# Patient Record
Sex: Female | Born: 1950
Health system: Southern US, Community
[De-identification: ages and names within clinical notes are randomized; demographics above are authoritative.]

## PROBLEM LIST (undated history)

## (undated) DIAGNOSIS — Z9981 Dependence on supplemental oxygen: Secondary | ICD-10-CM

## (undated) DIAGNOSIS — M533 Sacrococcygeal disorders, not elsewhere classified: Secondary | ICD-10-CM

## (undated) DIAGNOSIS — K219 Gastro-esophageal reflux disease without esophagitis: Secondary | ICD-10-CM

## (undated) DIAGNOSIS — C50919 Malignant neoplasm of unspecified site of unspecified female breast: Secondary | ICD-10-CM

## (undated) DIAGNOSIS — G8929 Other chronic pain: Secondary | ICD-10-CM

## (undated) DIAGNOSIS — M7061 Trochanteric bursitis, right hip: Secondary | ICD-10-CM

## (undated) DIAGNOSIS — M797 Fibromyalgia: Secondary | ICD-10-CM

## (undated) DIAGNOSIS — J449 Chronic obstructive pulmonary disease, unspecified: Secondary | ICD-10-CM

## (undated) DIAGNOSIS — D649 Anemia, unspecified: Secondary | ICD-10-CM

## (undated) DIAGNOSIS — E538 Deficiency of other specified B group vitamins: Secondary | ICD-10-CM

## (undated) DIAGNOSIS — I519 Heart disease, unspecified: Secondary | ICD-10-CM

## (undated) DIAGNOSIS — G43909 Migraine, unspecified, not intractable, without status migrainosus: Secondary | ICD-10-CM

## (undated) DIAGNOSIS — M7062 Trochanteric bursitis, left hip: Secondary | ICD-10-CM

## (undated) DIAGNOSIS — K589 Irritable bowel syndrome without diarrhea: Secondary | ICD-10-CM

## (undated) DIAGNOSIS — E785 Hyperlipidemia, unspecified: Secondary | ICD-10-CM

## (undated) DIAGNOSIS — M1990 Unspecified osteoarthritis, unspecified site: Secondary | ICD-10-CM

## (undated) HISTORY — PX: CARPAL TUNNEL RELEASE: SHX101

## (undated) HISTORY — PX: CERVICAL DISC SURGERY: SHX588

## (undated) HISTORY — DX: Deficiency of other specified B group vitamins: E53.8

## (undated) HISTORY — DX: Sacrococcygeal disorders, not elsewhere classified: M53.3

## (undated) HISTORY — DX: Fibromyalgia: M79.7

## (undated) HISTORY — DX: Heart disease, unspecified: I51.9

## (undated) HISTORY — DX: Gastro-esophageal reflux disease without esophagitis: K21.9

## (undated) HISTORY — DX: Trochanteric bursitis, left hip: M70.62

## (undated) HISTORY — DX: Irritable bowel syndrome, unspecified: K58.9

## (undated) HISTORY — DX: Migraine, unspecified, not intractable, without status migrainosus: G43.909

## (undated) HISTORY — DX: Hyperlipidemia, unspecified: E78.5

## (undated) HISTORY — PX: SEPTOPLASTY: SUR1290

## (undated) HISTORY — DX: Trochanteric bursitis, right hip: M70.61

## (undated) HISTORY — DX: Other chronic pain: G89.29

## (undated) HISTORY — DX: Anemia, unspecified: D64.9

## (undated) HISTORY — PX: ABDOMINAL HYSTERECTOMY: SHX81

## (undated) HISTORY — DX: Malignant neoplasm of unspecified site of unspecified female breast: C50.919

---

## 1997-08-31 HISTORY — PX: COLONOSCOPY W/ POLYPECTOMY: SHX1380

## 1997-12-17 ENCOUNTER — Ambulatory Visit (HOSPITAL_BASED_OUTPATIENT_CLINIC_OR_DEPARTMENT_OTHER): Admission: RE | Admit: 1997-12-17 | Discharge: 1997-12-17 | Payer: Self-pay | Admitting: *Deleted

## 1997-12-24 ENCOUNTER — Other Ambulatory Visit: Admission: RE | Admit: 1997-12-24 | Discharge: 1997-12-24 | Payer: Self-pay | Admitting: Gynecology

## 1999-05-08 ENCOUNTER — Ambulatory Visit (HOSPITAL_COMMUNITY): Admission: RE | Admit: 1999-05-08 | Discharge: 1999-05-08 | Payer: Self-pay | Admitting: *Deleted

## 1999-06-26 ENCOUNTER — Ambulatory Visit (HOSPITAL_COMMUNITY): Admission: RE | Admit: 1999-06-26 | Discharge: 1999-06-26 | Payer: Self-pay | Admitting: Internal Medicine

## 1999-06-26 ENCOUNTER — Encounter: Payer: Self-pay | Admitting: Internal Medicine

## 1999-08-29 ENCOUNTER — Ambulatory Visit (HOSPITAL_COMMUNITY): Admission: RE | Admit: 1999-08-29 | Discharge: 1999-08-29 | Payer: Self-pay | Admitting: Orthopedic Surgery

## 1999-08-29 ENCOUNTER — Encounter: Payer: Self-pay | Admitting: Orthopedic Surgery

## 1999-09-15 ENCOUNTER — Encounter: Payer: Self-pay | Admitting: Orthopedic Surgery

## 1999-09-15 ENCOUNTER — Ambulatory Visit (HOSPITAL_COMMUNITY): Admission: RE | Admit: 1999-09-15 | Discharge: 1999-09-15 | Payer: Self-pay | Admitting: Orthopedic Surgery

## 1999-10-13 ENCOUNTER — Ambulatory Visit (HOSPITAL_COMMUNITY): Admission: RE | Admit: 1999-10-13 | Discharge: 1999-10-13 | Payer: Self-pay | Admitting: Specialist

## 1999-10-13 ENCOUNTER — Encounter: Payer: Self-pay | Admitting: Specialist

## 2000-01-05 ENCOUNTER — Encounter: Payer: Self-pay | Admitting: Neurosurgery

## 2000-01-07 ENCOUNTER — Inpatient Hospital Stay (HOSPITAL_COMMUNITY): Admission: RE | Admit: 2000-01-07 | Discharge: 2000-01-08 | Payer: Self-pay | Admitting: Neurosurgery

## 2000-01-07 ENCOUNTER — Encounter: Payer: Self-pay | Admitting: Neurosurgery

## 2000-01-28 ENCOUNTER — Encounter: Payer: Self-pay | Admitting: Neurosurgery

## 2000-01-28 ENCOUNTER — Encounter: Admission: RE | Admit: 2000-01-28 | Discharge: 2000-01-28 | Payer: Self-pay | Admitting: Neurosurgery

## 2000-03-12 ENCOUNTER — Encounter: Payer: Self-pay | Admitting: Neurosurgery

## 2000-03-12 ENCOUNTER — Encounter: Admission: RE | Admit: 2000-03-12 | Discharge: 2000-03-12 | Payer: Self-pay | Admitting: Neurosurgery

## 2000-03-16 ENCOUNTER — Ambulatory Visit (HOSPITAL_COMMUNITY): Admission: RE | Admit: 2000-03-16 | Discharge: 2000-03-16 | Payer: Self-pay | Admitting: Gastroenterology

## 2000-04-27 ENCOUNTER — Encounter: Payer: Self-pay | Admitting: Neurosurgery

## 2000-04-27 ENCOUNTER — Encounter: Admission: RE | Admit: 2000-04-27 | Discharge: 2000-04-27 | Payer: Self-pay | Admitting: Neurosurgery

## 2000-05-14 ENCOUNTER — Encounter: Admission: RE | Admit: 2000-05-14 | Discharge: 2000-06-08 | Payer: Self-pay | Admitting: Neurosurgery

## 2000-06-01 ENCOUNTER — Encounter: Admission: RE | Admit: 2000-06-01 | Discharge: 2000-06-01 | Payer: Self-pay | Admitting: Neurosurgery

## 2000-06-01 ENCOUNTER — Encounter: Payer: Self-pay | Admitting: Neurosurgery

## 2000-06-29 ENCOUNTER — Encounter: Admission: RE | Admit: 2000-06-29 | Discharge: 2000-09-27 | Payer: Self-pay | Admitting: Anesthesiology

## 2000-09-07 ENCOUNTER — Encounter: Payer: Self-pay | Admitting: Neurosurgery

## 2000-09-07 ENCOUNTER — Encounter: Admission: RE | Admit: 2000-09-07 | Discharge: 2000-09-07 | Payer: Self-pay | Admitting: Neurosurgery

## 2001-04-09 ENCOUNTER — Emergency Department (HOSPITAL_COMMUNITY): Admission: EM | Admit: 2001-04-09 | Discharge: 2001-04-09 | Payer: Self-pay | Admitting: Emergency Medicine

## 2001-05-22 ENCOUNTER — Encounter: Payer: Self-pay | Admitting: Emergency Medicine

## 2001-05-23 ENCOUNTER — Inpatient Hospital Stay (HOSPITAL_COMMUNITY): Admission: EM | Admit: 2001-05-23 | Discharge: 2001-05-24 | Payer: Self-pay | Admitting: Emergency Medicine

## 2001-06-14 ENCOUNTER — Encounter: Payer: Self-pay | Admitting: Gastroenterology

## 2001-06-14 ENCOUNTER — Ambulatory Visit (HOSPITAL_COMMUNITY): Admission: RE | Admit: 2001-06-14 | Discharge: 2001-06-14 | Payer: Self-pay | Admitting: Gastroenterology

## 2001-06-16 ENCOUNTER — Ambulatory Visit (HOSPITAL_COMMUNITY): Admission: RE | Admit: 2001-06-16 | Discharge: 2001-06-16 | Payer: Self-pay | Admitting: Gastroenterology

## 2001-06-16 ENCOUNTER — Encounter (INDEPENDENT_AMBULATORY_CARE_PROVIDER_SITE_OTHER): Payer: Self-pay | Admitting: Specialist

## 2001-06-20 ENCOUNTER — Other Ambulatory Visit: Admission: RE | Admit: 2001-06-20 | Discharge: 2001-06-20 | Payer: Self-pay | Admitting: Gynecology

## 2001-06-30 ENCOUNTER — Encounter: Payer: Self-pay | Admitting: Gynecology

## 2001-06-30 ENCOUNTER — Encounter: Admission: RE | Admit: 2001-06-30 | Discharge: 2001-06-30 | Payer: Self-pay | Admitting: Gynecology

## 2001-12-23 ENCOUNTER — Ambulatory Visit (HOSPITAL_COMMUNITY): Admission: RE | Admit: 2001-12-23 | Discharge: 2001-12-23 | Payer: Self-pay | Admitting: Gastroenterology

## 2001-12-23 ENCOUNTER — Encounter (INDEPENDENT_AMBULATORY_CARE_PROVIDER_SITE_OTHER): Payer: Self-pay | Admitting: Specialist

## 2002-03-15 ENCOUNTER — Encounter: Admission: RE | Admit: 2002-03-15 | Discharge: 2002-03-15 | Payer: Self-pay | Admitting: Orthopedic Surgery

## 2002-03-15 ENCOUNTER — Encounter: Payer: Self-pay | Admitting: Orthopedic Surgery

## 2002-07-03 ENCOUNTER — Encounter: Admission: RE | Admit: 2002-07-03 | Discharge: 2002-07-03 | Payer: Self-pay | Admitting: Gynecology

## 2002-07-03 ENCOUNTER — Encounter: Payer: Self-pay | Admitting: Gynecology

## 2002-07-12 ENCOUNTER — Other Ambulatory Visit: Admission: RE | Admit: 2002-07-12 | Discharge: 2002-07-12 | Payer: Self-pay | Admitting: Gynecology

## 2002-11-29 ENCOUNTER — Ambulatory Visit (HOSPITAL_COMMUNITY): Admission: RE | Admit: 2002-11-29 | Discharge: 2002-11-29 | Payer: Self-pay | Admitting: Neurology

## 2003-09-01 HISTORY — PX: ESOPHAGEAL DILATION: SHX303

## 2004-01-09 ENCOUNTER — Other Ambulatory Visit: Admission: RE | Admit: 2004-01-09 | Discharge: 2004-01-09 | Payer: Self-pay | Admitting: Gynecology

## 2005-02-09 ENCOUNTER — Other Ambulatory Visit: Admission: RE | Admit: 2005-02-09 | Discharge: 2005-02-09 | Payer: Self-pay | Admitting: Gynecology

## 2005-03-27 LAB — HM COLONOSCOPY

## 2005-07-29 ENCOUNTER — Encounter: Admission: RE | Admit: 2005-07-29 | Discharge: 2005-07-29 | Payer: Self-pay | Admitting: Gynecology

## 2006-08-05 ENCOUNTER — Encounter (INDEPENDENT_AMBULATORY_CARE_PROVIDER_SITE_OTHER): Payer: Self-pay | Admitting: *Deleted

## 2006-08-05 ENCOUNTER — Ambulatory Visit: Payer: Self-pay | Admitting: Internal Medicine

## 2006-08-05 ENCOUNTER — Other Ambulatory Visit: Admission: RE | Admit: 2006-08-05 | Discharge: 2006-08-05 | Payer: Self-pay | Admitting: Gynecology

## 2006-08-05 LAB — CONVERTED CEMR LAB

## 2006-08-12 ENCOUNTER — Ambulatory Visit: Payer: Self-pay | Admitting: Internal Medicine

## 2006-08-12 LAB — CONVERTED CEMR LAB
AST: 28 units/L (ref 0–37)
BUN: 14 mg/dL (ref 6–23)
Basophils Relative: 0.3 % (ref 0.0–1.0)
Bilirubin Urine: NEGATIVE
Chloride: 105 meq/L (ref 96–112)
Chol/HDL Ratio, serum: 3
Creatinine, Ser: 0.9 mg/dL (ref 0.4–1.2)
Eosinophil percent: 0.2 % (ref 0.0–5.0)
HCT: 33.8 % — ABNORMAL LOW (ref 36.0–46.0)
Hemoglobin: 11.4 g/dL — ABNORMAL LOW (ref 12.0–15.0)
Hgb A1c MFr Bld: 5.6 % (ref 4.6–6.0)
LDL Cholesterol: 111 mg/dL — ABNORMAL HIGH (ref 0–99)
MCHC: 33.8 g/dL (ref 30.0–36.0)
MCV: 91 fL (ref 78.0–100.0)
Neutro Abs: 11.3 10*3/uL — ABNORMAL HIGH (ref 1.4–7.7)
Neutrophils Relative %: 83.5 % — ABNORMAL HIGH (ref 43.0–77.0)
RDW: 12.8 % (ref 11.5–14.6)
Specific Gravity, Urine: 1.015 (ref 1.000–1.03)
Total Bilirubin: 0.4 mg/dL (ref 0.3–1.2)
Triglyceride fasting, serum: 78 mg/dL (ref 0–149)
Urine Glucose: NEGATIVE mg/dL
VLDL: 16 mg/dL (ref 0–40)
WBC: 13.5 10*3/uL — ABNORMAL HIGH (ref 4.5–10.5)
pH: 8 (ref 5.0–8.0)

## 2006-08-26 ENCOUNTER — Encounter: Admission: RE | Admit: 2006-08-26 | Discharge: 2006-08-26 | Payer: Self-pay | Admitting: Gynecology

## 2007-06-07 ENCOUNTER — Encounter: Payer: Self-pay | Admitting: Internal Medicine

## 2007-07-14 ENCOUNTER — Ambulatory Visit: Payer: Self-pay | Admitting: Internal Medicine

## 2007-07-14 DIAGNOSIS — M797 Fibromyalgia: Secondary | ICD-10-CM | POA: Insufficient documentation

## 2007-07-27 ENCOUNTER — Ambulatory Visit: Payer: Self-pay | Admitting: Internal Medicine

## 2007-08-18 ENCOUNTER — Telehealth (INDEPENDENT_AMBULATORY_CARE_PROVIDER_SITE_OTHER): Payer: Self-pay | Admitting: *Deleted

## 2007-08-19 ENCOUNTER — Ambulatory Visit: Payer: Self-pay | Admitting: Internal Medicine

## 2007-09-01 HISTORY — PX: MASTECTOMY: SHX3

## 2007-09-01 LAB — HM COLONOSCOPY: HM Colonoscopy: NEGATIVE

## 2007-10-12 ENCOUNTER — Encounter (INDEPENDENT_AMBULATORY_CARE_PROVIDER_SITE_OTHER): Payer: Self-pay | Admitting: *Deleted

## 2007-10-12 DIAGNOSIS — M810 Age-related osteoporosis without current pathological fracture: Secondary | ICD-10-CM | POA: Insufficient documentation

## 2007-10-12 DIAGNOSIS — D518 Other vitamin B12 deficiency anemias: Secondary | ICD-10-CM | POA: Insufficient documentation

## 2007-10-12 DIAGNOSIS — M19041 Primary osteoarthritis, right hand: Secondary | ICD-10-CM

## 2007-10-12 DIAGNOSIS — F411 Generalized anxiety disorder: Secondary | ICD-10-CM | POA: Insufficient documentation

## 2007-10-12 DIAGNOSIS — K2990 Gastroduodenitis, unspecified, without bleeding: Secondary | ICD-10-CM

## 2007-10-12 DIAGNOSIS — K589 Irritable bowel syndrome without diarrhea: Secondary | ICD-10-CM

## 2007-10-12 DIAGNOSIS — Z87898 Personal history of other specified conditions: Secondary | ICD-10-CM

## 2007-10-12 DIAGNOSIS — K2970 Gastritis, unspecified, without bleeding: Secondary | ICD-10-CM | POA: Insufficient documentation

## 2007-10-12 DIAGNOSIS — F329 Major depressive disorder, single episode, unspecified: Secondary | ICD-10-CM

## 2007-10-12 DIAGNOSIS — Z860100 Personal history of colon polyps, unspecified: Secondary | ICD-10-CM | POA: Insufficient documentation

## 2007-10-12 DIAGNOSIS — F419 Anxiety disorder, unspecified: Secondary | ICD-10-CM

## 2007-10-12 DIAGNOSIS — M19042 Primary osteoarthritis, left hand: Secondary | ICD-10-CM | POA: Insufficient documentation

## 2007-10-18 ENCOUNTER — Ambulatory Visit: Payer: Self-pay | Admitting: Internal Medicine

## 2007-10-18 DIAGNOSIS — R51 Headache: Secondary | ICD-10-CM | POA: Insufficient documentation

## 2007-10-18 DIAGNOSIS — R519 Headache, unspecified: Secondary | ICD-10-CM | POA: Insufficient documentation

## 2007-10-18 DIAGNOSIS — R7989 Other specified abnormal findings of blood chemistry: Secondary | ICD-10-CM | POA: Insufficient documentation

## 2007-10-18 DIAGNOSIS — K209 Esophagitis, unspecified without bleeding: Secondary | ICD-10-CM | POA: Insufficient documentation

## 2007-10-18 DIAGNOSIS — E782 Mixed hyperlipidemia: Secondary | ICD-10-CM

## 2007-10-18 DIAGNOSIS — K219 Gastro-esophageal reflux disease without esophagitis: Secondary | ICD-10-CM | POA: Insufficient documentation

## 2007-10-28 ENCOUNTER — Encounter (INDEPENDENT_AMBULATORY_CARE_PROVIDER_SITE_OTHER): Payer: Self-pay | Admitting: *Deleted

## 2007-11-02 ENCOUNTER — Encounter: Payer: Self-pay | Admitting: Internal Medicine

## 2007-11-24 ENCOUNTER — Ambulatory Visit: Payer: Self-pay | Admitting: Internal Medicine

## 2007-11-24 ENCOUNTER — Encounter: Admission: RE | Admit: 2007-11-24 | Discharge: 2007-11-24 | Payer: Self-pay | Admitting: Gynecology

## 2007-12-01 ENCOUNTER — Ambulatory Visit: Payer: Self-pay | Admitting: Internal Medicine

## 2007-12-02 ENCOUNTER — Encounter: Payer: Self-pay | Admitting: Internal Medicine

## 2007-12-05 ENCOUNTER — Encounter: Admission: RE | Admit: 2007-12-05 | Discharge: 2007-12-05 | Payer: Self-pay | Admitting: Gynecology

## 2007-12-08 ENCOUNTER — Ambulatory Visit: Payer: Self-pay | Admitting: Internal Medicine

## 2007-12-13 ENCOUNTER — Encounter: Admission: RE | Admit: 2007-12-13 | Discharge: 2007-12-13 | Payer: Self-pay | Admitting: Gynecology

## 2007-12-14 ENCOUNTER — Ambulatory Visit: Payer: Self-pay | Admitting: Oncology

## 2007-12-15 ENCOUNTER — Ambulatory Visit: Payer: Self-pay | Admitting: Internal Medicine

## 2007-12-19 ENCOUNTER — Encounter: Payer: Self-pay | Admitting: Internal Medicine

## 2007-12-21 LAB — CBC WITH DIFFERENTIAL/PLATELET
BASO%: 0.3 % (ref 0.0–2.0)
HCT: 36.4 % (ref 34.8–46.6)
HGB: 12.5 g/dL (ref 11.6–15.9)
MCHC: 34.3 g/dL (ref 32.0–36.0)
MONO#: 0.7 10*3/uL (ref 0.1–0.9)
NEUT%: 65.9 % (ref 39.6–76.8)
WBC: 8.4 10*3/uL (ref 3.9–10.0)
lymph#: 2 10*3/uL (ref 0.9–3.3)

## 2007-12-21 LAB — COMPREHENSIVE METABOLIC PANEL
ALT: 9 U/L (ref 0–35)
Albumin: 4.6 g/dL (ref 3.5–5.2)
CO2: 26 mEq/L (ref 19–32)
Calcium: 9.7 mg/dL (ref 8.4–10.5)
Chloride: 102 mEq/L (ref 96–112)
Creatinine, Ser: 0.84 mg/dL (ref 0.40–1.20)
Potassium: 3.9 mEq/L (ref 3.5–5.3)
Sodium: 141 mEq/L (ref 135–145)
Total Protein: 7.7 g/dL (ref 6.0–8.3)

## 2007-12-21 LAB — LACTATE DEHYDROGENASE: LDH: 144 U/L (ref 94–250)

## 2007-12-26 ENCOUNTER — Encounter: Payer: Self-pay | Admitting: Oncology

## 2007-12-26 ENCOUNTER — Ambulatory Visit: Payer: Self-pay

## 2007-12-27 ENCOUNTER — Ambulatory Visit (HOSPITAL_COMMUNITY): Admission: RE | Admit: 2007-12-27 | Discharge: 2007-12-27 | Payer: Self-pay | Admitting: Oncology

## 2007-12-29 ENCOUNTER — Ambulatory Visit (HOSPITAL_BASED_OUTPATIENT_CLINIC_OR_DEPARTMENT_OTHER): Admission: RE | Admit: 2007-12-29 | Discharge: 2007-12-29 | Payer: Self-pay | Admitting: General Surgery

## 2008-01-02 ENCOUNTER — Ambulatory Visit (HOSPITAL_COMMUNITY): Admission: RE | Admit: 2008-01-02 | Discharge: 2008-01-02 | Payer: Self-pay | Admitting: Oncology

## 2008-01-09 ENCOUNTER — Ambulatory Visit (HOSPITAL_COMMUNITY): Admission: RE | Admit: 2008-01-09 | Discharge: 2008-01-09 | Payer: Self-pay | Admitting: Oncology

## 2008-01-26 ENCOUNTER — Ambulatory Visit: Payer: Self-pay | Admitting: Oncology

## 2008-01-30 LAB — COMPREHENSIVE METABOLIC PANEL
ALT: 18 U/L (ref 0–35)
CO2: 26 mEq/L (ref 19–32)
Calcium: 9.9 mg/dL (ref 8.4–10.5)
Chloride: 105 mEq/L (ref 96–112)
Sodium: 141 mEq/L (ref 135–145)
Total Protein: 6.8 g/dL (ref 6.0–8.3)

## 2008-01-30 LAB — CBC WITH DIFFERENTIAL/PLATELET
BASO%: 0.6 % (ref 0.0–2.0)
HCT: 30.1 % — ABNORMAL LOW (ref 34.8–46.6)
MCHC: 34.2 g/dL (ref 32.0–36.0)
MONO#: 0.6 10*3/uL (ref 0.1–0.9)
NEUT#: 3.2 10*3/uL (ref 1.5–6.5)
NEUT%: 65.2 % (ref 39.6–76.8)
RBC: 3.39 10*6/uL — ABNORMAL LOW (ref 3.70–5.32)
WBC: 4.8 10*3/uL (ref 3.9–10.0)
lymph#: 1.1 10*3/uL (ref 0.9–3.3)

## 2008-02-07 ENCOUNTER — Encounter: Payer: Self-pay | Admitting: Internal Medicine

## 2008-02-07 LAB — CBC WITH DIFFERENTIAL/PLATELET
BASO%: 3.7 % — ABNORMAL HIGH (ref 0.0–2.0)
LYMPH%: 69 % — ABNORMAL HIGH (ref 14.0–48.0)
MCHC: 34.3 g/dL (ref 32.0–36.0)
MONO#: 0 10*3/uL — ABNORMAL LOW (ref 0.1–0.9)
MONO%: 5.3 % (ref 0.0–13.0)
Platelets: 117 10*3/uL — ABNORMAL LOW (ref 145–400)
RBC: 3.06 10*6/uL — ABNORMAL LOW (ref 3.70–5.32)
RDW: 14.7 % — ABNORMAL HIGH (ref 11.3–14.5)
WBC: 0.8 10*3/uL — CL (ref 3.9–10.0)

## 2008-02-10 LAB — CBC WITH DIFFERENTIAL/PLATELET
BASO%: 1 % (ref 0.0–2.0)
HCT: 25.8 % — ABNORMAL LOW (ref 34.8–46.6)
MCHC: 34.8 g/dL (ref 32.0–36.0)
MONO#: 0.3 10*3/uL (ref 0.1–0.9)
RBC: 2.92 10*6/uL — ABNORMAL LOW (ref 3.70–5.32)
WBC: 3.9 10*3/uL (ref 3.9–10.0)
lymph#: 0.9 10*3/uL (ref 0.9–3.3)

## 2008-02-20 ENCOUNTER — Encounter: Payer: Self-pay | Admitting: Internal Medicine

## 2008-02-20 LAB — CBC WITH DIFFERENTIAL/PLATELET
Basophils Absolute: 0 10*3/uL (ref 0.0–0.1)
Eosinophils Absolute: 0 10*3/uL (ref 0.0–0.5)
HCT: 28.9 % — ABNORMAL LOW (ref 34.8–46.6)
HGB: 9.9 g/dL — ABNORMAL LOW (ref 11.6–15.9)
MONO#: 0.6 10*3/uL (ref 0.1–0.9)
NEUT%: 71.5 % (ref 39.6–76.8)
WBC: 5.9 10*3/uL (ref 3.9–10.0)
lymph#: 1.1 10*3/uL (ref 0.9–3.3)

## 2008-02-20 LAB — COMPREHENSIVE METABOLIC PANEL
ALT: 12 U/L (ref 0–35)
BUN: 18 mg/dL (ref 6–23)
CO2: 25 mEq/L (ref 19–32)
Calcium: 9.3 mg/dL (ref 8.4–10.5)
Chloride: 105 mEq/L (ref 96–112)
Creatinine, Ser: 0.85 mg/dL (ref 0.40–1.20)

## 2008-03-07 ENCOUNTER — Ambulatory Visit: Payer: Self-pay | Admitting: Oncology

## 2008-03-12 ENCOUNTER — Encounter: Payer: Self-pay | Admitting: Internal Medicine

## 2008-03-12 LAB — CBC WITH DIFFERENTIAL/PLATELET
BASO%: 1.1 % (ref 0.0–2.0)
EOS%: 0 % (ref 0.0–7.0)
LYMPH%: 20.6 % (ref 14.0–48.0)
MCH: 32.2 pg (ref 26.0–34.0)
MCHC: 34.6 g/dL (ref 32.0–36.0)
MCV: 93.1 fL (ref 81.0–101.0)
MONO%: 12.6 % (ref 0.0–13.0)
Platelets: 383 10*3/uL (ref 145–400)
RBC: 2.79 10*6/uL — ABNORMAL LOW (ref 3.70–5.32)
RDW: 18.9 % — ABNORMAL HIGH (ref 11.3–14.5)

## 2008-03-20 LAB — CBC WITH DIFFERENTIAL/PLATELET
BASO%: 4 % — ABNORMAL HIGH (ref 0.0–2.0)
LYMPH%: 84.1 % — ABNORMAL HIGH (ref 14.0–48.0)
MCHC: 34.9 g/dL (ref 32.0–36.0)
MONO#: 0 10*3/uL — ABNORMAL LOW (ref 0.1–0.9)
Platelets: 89 10*3/uL — ABNORMAL LOW (ref 145–400)
RBC: 2.57 10*6/uL — ABNORMAL LOW (ref 3.70–5.32)
RDW: 17.5 % — ABNORMAL HIGH (ref 11.3–14.5)
WBC: 0.7 10*3/uL — CL (ref 3.9–10.0)

## 2008-03-26 ENCOUNTER — Encounter: Admission: RE | Admit: 2008-03-26 | Discharge: 2008-03-26 | Payer: Self-pay | Admitting: Oncology

## 2008-03-28 ENCOUNTER — Ambulatory Visit (HOSPITAL_COMMUNITY): Admission: RE | Admit: 2008-03-28 | Discharge: 2008-03-28 | Payer: Self-pay | Admitting: Oncology

## 2008-04-02 LAB — COMPREHENSIVE METABOLIC PANEL
ALT: 8 U/L (ref 0–35)
AST: 16 U/L (ref 0–37)
Albumin: 4 g/dL (ref 3.5–5.2)
Alkaline Phosphatase: 55 U/L (ref 39–117)
Potassium: 4 mEq/L (ref 3.5–5.3)
Sodium: 141 mEq/L (ref 135–145)
Total Bilirubin: 0.2 mg/dL — ABNORMAL LOW (ref 0.3–1.2)
Total Protein: 6.3 g/dL (ref 6.0–8.3)

## 2008-04-02 LAB — CBC WITH DIFFERENTIAL/PLATELET
BASO%: 0.5 % (ref 0.0–2.0)
EOS%: 0.1 % (ref 0.0–7.0)
Eosinophils Absolute: 0 10*3/uL (ref 0.0–0.5)
MCH: 33.3 pg (ref 26.0–34.0)
MCHC: 34.4 g/dL (ref 32.0–36.0)
MCV: 96.7 fL (ref 81.0–101.0)
MONO%: 17.3 % — ABNORMAL HIGH (ref 0.0–13.0)
NEUT#: 1.5 10*3/uL (ref 1.5–6.5)
RBC: 2.55 10*6/uL — ABNORMAL LOW (ref 3.70–5.32)
RDW: 19.5 % — ABNORMAL HIGH (ref 11.3–14.5)

## 2008-04-03 ENCOUNTER — Encounter: Payer: Self-pay | Admitting: Internal Medicine

## 2008-04-09 ENCOUNTER — Encounter: Payer: Self-pay | Admitting: Internal Medicine

## 2008-04-09 LAB — CBC WITH DIFFERENTIAL/PLATELET
EOS%: 0.6 % (ref 0.0–7.0)
LYMPH%: 30.7 % (ref 14.0–48.0)
MCH: 33.2 pg (ref 26.0–34.0)
MCV: 97.6 fL (ref 81.0–101.0)
MONO%: 20.5 % — ABNORMAL HIGH (ref 0.0–13.0)
Platelets: 276 10*3/uL (ref 145–400)
RBC: 2.83 10*6/uL — ABNORMAL LOW (ref 3.70–5.32)
RDW: 19.3 % — ABNORMAL HIGH (ref 11.3–14.5)

## 2008-04-09 LAB — COMPREHENSIVE METABOLIC PANEL
AST: 29 U/L (ref 0–37)
Albumin: 3.5 g/dL (ref 3.5–5.2)
Alkaline Phosphatase: 54 U/L (ref 39–117)
BUN: 9 mg/dL (ref 6–23)
Glucose, Bld: 77 mg/dL (ref 70–99)
Potassium: 3.5 mEq/L (ref 3.5–5.3)
Sodium: 142 mEq/L (ref 135–145)
Total Bilirubin: 0.5 mg/dL (ref 0.3–1.2)
Total Protein: 5.8 g/dL — ABNORMAL LOW (ref 6.0–8.3)

## 2008-04-16 ENCOUNTER — Encounter: Payer: Self-pay | Admitting: Internal Medicine

## 2008-04-16 LAB — CBC WITH DIFFERENTIAL/PLATELET
Basophils Absolute: 0 10*3/uL (ref 0.0–0.1)
EOS%: 2.4 % (ref 0.0–7.0)
Eosinophils Absolute: 0.1 10*3/uL (ref 0.0–0.5)
HGB: 8.4 g/dL — ABNORMAL LOW (ref 11.6–15.9)
LYMPH%: 32.5 % (ref 14.0–48.0)
MCH: 33.5 pg (ref 26.0–34.0)
MCV: 98.1 fL (ref 81.0–101.0)
MONO%: 9.8 % (ref 0.0–13.0)
NEUT#: 2.1 10*3/uL (ref 1.5–6.5)
Platelets: 163 10*3/uL (ref 145–400)
RDW: 16.9 % — ABNORMAL HIGH (ref 11.3–14.5)

## 2008-04-20 ENCOUNTER — Ambulatory Visit: Payer: Self-pay | Admitting: Oncology

## 2008-04-24 ENCOUNTER — Encounter: Payer: Self-pay | Admitting: Internal Medicine

## 2008-04-24 LAB — CBC WITH DIFFERENTIAL/PLATELET
Basophils Absolute: 0.1 10*3/uL (ref 0.0–0.1)
EOS%: 2.5 % (ref 0.0–7.0)
Eosinophils Absolute: 0.1 10*3/uL (ref 0.0–0.5)
HGB: 10.6 g/dL — ABNORMAL LOW (ref 11.6–15.9)
LYMPH%: 28.7 % (ref 14.0–48.0)
MCH: 33.5 pg (ref 26.0–34.0)
MCV: 98.4 fL (ref 81.0–101.0)
MONO%: 7.6 % (ref 0.0–13.0)
NEUT#: 2.9 10*3/uL (ref 1.5–6.5)
Platelets: 213 10*3/uL (ref 145–400)

## 2008-05-01 LAB — CBC WITH DIFFERENTIAL/PLATELET
Basophils Absolute: 0.1 10*3/uL (ref 0.0–0.1)
EOS%: 2.5 % (ref 0.0–7.0)
Eosinophils Absolute: 0.1 10*3/uL (ref 0.0–0.5)
LYMPH%: 27.8 % (ref 14.0–48.0)
MCH: 33.6 pg (ref 26.0–34.0)
MCV: 98.2 fL (ref 81.0–101.0)
MONO%: 7.1 % (ref 0.0–13.0)
Platelets: 207 10*3/uL (ref 145–400)
RBC: 2.9 10*6/uL — ABNORMAL LOW (ref 3.70–5.32)
RDW: 14.3 % (ref 11.3–14.5)

## 2008-05-08 LAB — CBC WITH DIFFERENTIAL/PLATELET
BASO%: 1.7 % (ref 0.0–2.0)
Eosinophils Absolute: 0 10*3/uL (ref 0.0–0.5)
LYMPH%: 16.5 % (ref 14.0–48.0)
MCHC: 34.2 g/dL (ref 32.0–36.0)
MCV: 98.1 fL (ref 81.0–101.0)
MONO%: 7.6 % (ref 0.0–13.0)
NEUT#: 4.2 10*3/uL (ref 1.5–6.5)
Platelets: 212 10*3/uL (ref 145–400)
RBC: 3.06 10*6/uL — ABNORMAL LOW (ref 3.70–5.32)
RDW: 13.5 % (ref 11.3–14.5)
WBC: 5.7 10*3/uL (ref 3.9–10.0)

## 2008-05-15 ENCOUNTER — Encounter: Payer: Self-pay | Admitting: Internal Medicine

## 2008-05-15 LAB — CBC WITH DIFFERENTIAL/PLATELET
BASO%: 1.2 % (ref 0.0–2.0)
Eosinophils Absolute: 0.1 10*3/uL (ref 0.0–0.5)
LYMPH%: 27.7 % (ref 14.0–48.0)
MCHC: 33.4 g/dL (ref 32.0–36.0)
MONO#: 0.5 10*3/uL (ref 0.1–0.9)
NEUT#: 2.3 10*3/uL (ref 1.5–6.5)
RBC: 3.07 10*6/uL — ABNORMAL LOW (ref 3.70–5.32)
RDW: 13.7 % (ref 11.3–14.5)
WBC: 4.1 10*3/uL (ref 3.9–10.0)
lymph#: 1.1 10*3/uL (ref 0.9–3.3)

## 2008-05-16 ENCOUNTER — Encounter: Payer: Self-pay | Admitting: Internal Medicine

## 2008-05-22 LAB — CBC WITH DIFFERENTIAL/PLATELET
BASO%: 1.8 % (ref 0.0–2.0)
Eosinophils Absolute: 0.1 10*3/uL (ref 0.0–0.5)
HCT: 29.1 % — ABNORMAL LOW (ref 34.8–46.6)
HGB: 9.8 g/dL — ABNORMAL LOW (ref 11.6–15.9)
MCHC: 33.6 g/dL (ref 32.0–36.0)
MONO#: 0.4 10*3/uL (ref 0.1–0.9)
NEUT#: 2.3 10*3/uL (ref 1.5–6.5)
NEUT%: 57.9 % (ref 39.6–76.8)
Platelets: 230 10*3/uL (ref 145–400)
WBC: 4 10*3/uL (ref 3.9–10.0)
lymph#: 1.2 10*3/uL (ref 0.9–3.3)

## 2008-05-29 ENCOUNTER — Encounter: Payer: Self-pay | Admitting: Internal Medicine

## 2008-05-29 LAB — CBC WITH DIFFERENTIAL/PLATELET
Basophils Absolute: 0 10*3/uL (ref 0.0–0.1)
EOS%: 1 % (ref 0.0–7.0)
HGB: 10.3 g/dL — ABNORMAL LOW (ref 11.6–15.9)
LYMPH%: 28.7 % (ref 14.0–48.0)
MCH: 32.9 pg (ref 26.0–34.0)
MCV: 97.1 fL (ref 81.0–101.0)
MONO%: 7.2 % (ref 0.0–13.0)
RBC: 3.13 10*6/uL — ABNORMAL LOW (ref 3.70–5.32)
RDW: 13 % (ref 11.3–14.5)

## 2008-06-05 ENCOUNTER — Ambulatory Visit: Payer: Self-pay | Admitting: Oncology

## 2008-06-05 LAB — CBC WITH DIFFERENTIAL/PLATELET
Basophils Absolute: 0.1 10*3/uL (ref 0.0–0.1)
MCH: 33 pg (ref 26.0–34.0)
MCV: 99 fL (ref 81.0–101.0)
MONO#: 0.4 10*3/uL (ref 0.1–0.9)
NEUT#: 2.1 10*3/uL (ref 1.5–6.5)
NEUT%: 51.6 % (ref 39.6–76.8)
Platelets: 197 10*3/uL (ref 145–400)
lymph#: 1.4 10*3/uL (ref 0.9–3.3)

## 2008-06-12 LAB — CBC WITH DIFFERENTIAL/PLATELET
Basophils Absolute: 0.1 10*3/uL (ref 0.0–0.1)
EOS%: 1.8 % (ref 0.0–7.0)
HCT: 27 % — ABNORMAL LOW (ref 34.8–46.6)
HGB: 9.3 g/dL — ABNORMAL LOW (ref 11.6–15.9)
MCH: 33.3 pg (ref 26.0–34.0)
MCV: 96.5 fL (ref 81.0–101.0)
MONO%: 11.3 % (ref 0.0–13.0)
NEUT%: 48.4 % (ref 39.6–76.8)

## 2008-06-12 LAB — COMPREHENSIVE METABOLIC PANEL
AST: 21 U/L (ref 0–37)
Alkaline Phosphatase: 51 U/L (ref 39–117)
BUN: 16 mg/dL (ref 6–23)
Calcium: 9 mg/dL (ref 8.4–10.5)
Chloride: 110 mEq/L (ref 96–112)
Creatinine, Ser: 0.8 mg/dL (ref 0.40–1.20)

## 2008-06-19 LAB — CBC WITH DIFFERENTIAL/PLATELET
Basophils Absolute: 0.1 10*3/uL (ref 0.0–0.1)
EOS%: 2.6 % (ref 0.0–7.0)
Eosinophils Absolute: 0.1 10*3/uL (ref 0.0–0.5)
HCT: 26.1 % — ABNORMAL LOW (ref 34.8–46.6)
HGB: 8.8 g/dL — ABNORMAL LOW (ref 11.6–15.9)
MCH: 32.7 pg (ref 26.0–34.0)
NEUT%: 53.1 % (ref 39.6–76.8)
lymph#: 1.1 10*3/uL (ref 0.9–3.3)

## 2008-06-26 LAB — CBC WITH DIFFERENTIAL/PLATELET
Basophils Absolute: 0.1 10*3/uL (ref 0.0–0.1)
EOS%: 2.1 % (ref 0.0–7.0)
HCT: 27.1 % — ABNORMAL LOW (ref 34.8–46.6)
HGB: 9.4 g/dL — ABNORMAL LOW (ref 11.6–15.9)
LYMPH%: 32.6 % (ref 14.0–48.0)
MCH: 33.3 pg (ref 26.0–34.0)
MCV: 96.6 fL (ref 81.0–101.0)
MONO%: 12.9 % (ref 0.0–13.0)
NEUT%: 50.2 % (ref 39.6–76.8)
Platelets: 228 10*3/uL (ref 145–400)
RDW: 14.2 % (ref 11.3–14.5)

## 2008-06-26 LAB — COMPREHENSIVE METABOLIC PANEL
AST: 18 U/L (ref 0–37)
Albumin: 3.9 g/dL (ref 3.5–5.2)
Alkaline Phosphatase: 49 U/L (ref 39–117)
Potassium: 3.6 mEq/L (ref 3.5–5.3)
Sodium: 141 mEq/L (ref 135–145)
Total Bilirubin: 0.3 mg/dL (ref 0.3–1.2)
Total Protein: 5.9 g/dL — ABNORMAL LOW (ref 6.0–8.3)

## 2008-07-09 ENCOUNTER — Encounter: Admission: RE | Admit: 2008-07-09 | Discharge: 2008-07-09 | Payer: Self-pay | Admitting: Oncology

## 2008-07-17 ENCOUNTER — Ambulatory Visit (HOSPITAL_COMMUNITY): Admission: RE | Admit: 2008-07-17 | Discharge: 2008-07-17 | Payer: Self-pay | Admitting: Oncology

## 2008-07-17 LAB — CBC WITH DIFFERENTIAL/PLATELET
Basophils Absolute: 0.1 10*3/uL (ref 0.0–0.1)
EOS%: 1.9 % (ref 0.0–7.0)
HCT: 28 % — ABNORMAL LOW (ref 34.8–46.6)
HGB: 9.4 g/dL — ABNORMAL LOW (ref 11.6–15.9)
MCH: 31.9 pg (ref 26.0–34.0)
MCV: 95.3 fL (ref 81.0–101.0)
MONO%: 11.1 % (ref 0.0–13.0)
NEUT%: 65.2 % (ref 39.6–76.8)
lymph#: 1.2 10*3/uL (ref 0.9–3.3)

## 2008-07-17 LAB — COMPREHENSIVE METABOLIC PANEL
AST: 17 U/L (ref 0–37)
Alkaline Phosphatase: 56 U/L (ref 39–117)
BUN: 14 mg/dL (ref 6–23)
Creatinine, Ser: 0.68 mg/dL (ref 0.40–1.20)
Total Bilirubin: 0.3 mg/dL (ref 0.3–1.2)

## 2008-07-24 ENCOUNTER — Ambulatory Visit: Payer: Self-pay | Admitting: Oncology

## 2008-07-24 LAB — COMPREHENSIVE METABOLIC PANEL
ALT: 10 U/L (ref 0–35)
Albumin: 3.8 g/dL (ref 3.5–5.2)
Alkaline Phosphatase: 58 U/L (ref 39–117)
CO2: 25 mEq/L (ref 19–32)
Glucose, Bld: 87 mg/dL (ref 70–99)
Potassium: 3.8 mEq/L (ref 3.5–5.3)
Sodium: 144 mEq/L (ref 135–145)
Total Protein: 6.2 g/dL (ref 6.0–8.3)

## 2008-07-24 LAB — CBC WITH DIFFERENTIAL/PLATELET
Basophils Absolute: 0.1 10*3/uL (ref 0.0–0.1)
EOS%: 2.2 % (ref 0.0–7.0)
HGB: 10.1 g/dL — ABNORMAL LOW (ref 11.6–15.9)
LYMPH%: 27.4 % (ref 14.0–48.0)
MCH: 33.2 pg (ref 26.0–34.0)
MCV: 97.2 fL (ref 81.0–101.0)
MONO%: 11.5 % (ref 0.0–13.0)
NEUT%: 57.2 % (ref 39.6–76.8)
RDW: 16.1 % — ABNORMAL HIGH (ref 11.3–14.5)

## 2008-07-30 ENCOUNTER — Ambulatory Visit: Admission: RE | Admit: 2008-07-30 | Discharge: 2008-08-07 | Payer: Self-pay | Admitting: Radiation Oncology

## 2008-07-31 DIAGNOSIS — C50919 Malignant neoplasm of unspecified site of unspecified female breast: Secondary | ICD-10-CM | POA: Insufficient documentation

## 2008-08-01 ENCOUNTER — Encounter: Payer: Self-pay | Admitting: Internal Medicine

## 2008-08-08 ENCOUNTER — Encounter (INDEPENDENT_AMBULATORY_CARE_PROVIDER_SITE_OTHER): Payer: Self-pay | Admitting: Surgery

## 2008-08-08 ENCOUNTER — Inpatient Hospital Stay (HOSPITAL_COMMUNITY): Admission: RE | Admit: 2008-08-08 | Discharge: 2008-08-11 | Payer: Self-pay | Admitting: Surgery

## 2008-08-29 ENCOUNTER — Inpatient Hospital Stay (HOSPITAL_COMMUNITY): Admission: RE | Admit: 2008-08-29 | Discharge: 2008-09-01 | Payer: Self-pay | Admitting: Plastic Surgery

## 2008-08-29 ENCOUNTER — Encounter (INDEPENDENT_AMBULATORY_CARE_PROVIDER_SITE_OTHER): Payer: Self-pay | Admitting: Plastic Surgery

## 2008-09-17 ENCOUNTER — Encounter: Payer: Self-pay | Admitting: Internal Medicine

## 2008-09-28 ENCOUNTER — Ambulatory Visit: Payer: Self-pay | Admitting: Oncology

## 2008-10-10 ENCOUNTER — Encounter: Payer: Self-pay | Admitting: Internal Medicine

## 2008-10-10 ENCOUNTER — Ambulatory Visit (HOSPITAL_COMMUNITY): Admission: RE | Admit: 2008-10-10 | Discharge: 2008-10-10 | Payer: Self-pay | Admitting: Oncology

## 2008-10-16 LAB — CBC WITH DIFFERENTIAL/PLATELET
Basophils Absolute: 0 10*3/uL (ref 0.0–0.1)
EOS%: 2.4 % (ref 0.0–7.0)
HCT: 33 % — ABNORMAL LOW (ref 34.8–46.6)
HGB: 11.3 g/dL — ABNORMAL LOW (ref 11.6–15.9)
MCH: 31.3 pg (ref 26.0–34.0)
MCV: 91.3 fL (ref 81.0–101.0)
MONO%: 11.3 % (ref 0.0–13.0)
NEUT%: 48.1 % (ref 39.6–76.8)

## 2008-11-02 ENCOUNTER — Ambulatory Visit: Payer: Self-pay | Admitting: Internal Medicine

## 2008-11-02 DIAGNOSIS — J309 Allergic rhinitis, unspecified: Secondary | ICD-10-CM | POA: Insufficient documentation

## 2008-11-02 DIAGNOSIS — Z853 Personal history of malignant neoplasm of breast: Secondary | ICD-10-CM

## 2008-11-06 LAB — COMPREHENSIVE METABOLIC PANEL
Albumin: 4.2 g/dL (ref 3.5–5.2)
BUN: 14 mg/dL (ref 6–23)
CO2: 29 mEq/L (ref 19–32)
Calcium: 9.3 mg/dL (ref 8.4–10.5)
Glucose, Bld: 135 mg/dL — ABNORMAL HIGH (ref 70–99)
Potassium: 4.1 mEq/L (ref 3.5–5.3)
Sodium: 141 mEq/L (ref 135–145)
Total Protein: 6.2 g/dL (ref 6.0–8.3)

## 2008-11-06 LAB — CBC WITH DIFFERENTIAL/PLATELET
BASO%: 0.4 % (ref 0.0–2.0)
LYMPH%: 30.4 % (ref 14.0–49.7)
MCHC: 34.3 g/dL (ref 31.5–36.0)
MONO#: 0.5 10*3/uL (ref 0.1–0.9)
NEUT#: 2.5 10*3/uL (ref 1.5–6.5)
RBC: 3.74 10*6/uL (ref 3.70–5.45)
RDW: 14.9 % — ABNORMAL HIGH (ref 11.2–14.5)
WBC: 4.4 10*3/uL (ref 3.9–10.3)
lymph#: 1.3 10*3/uL (ref 0.9–3.3)

## 2008-11-06 LAB — LACTATE DEHYDROGENASE: LDH: 135 U/L (ref 94–250)

## 2008-11-19 ENCOUNTER — Ambulatory Visit (HOSPITAL_COMMUNITY): Admission: RE | Admit: 2008-11-19 | Discharge: 2008-11-19 | Payer: Self-pay | Admitting: Oncology

## 2008-11-23 ENCOUNTER — Ambulatory Visit: Payer: Self-pay | Admitting: Oncology

## 2008-11-27 LAB — CBC WITH DIFFERENTIAL/PLATELET
BASO%: 0.7 % (ref 0.0–2.0)
Eosinophils Absolute: 0.1 10*3/uL (ref 0.0–0.5)
HCT: 34 % — ABNORMAL LOW (ref 34.8–46.6)
HGB: 11.4 g/dL — ABNORMAL LOW (ref 11.6–15.9)
MCHC: 33.5 g/dL (ref 31.5–36.0)
MONO#: 0.4 10*3/uL (ref 0.1–0.9)
NEUT#: 2.3 10*3/uL (ref 1.5–6.5)
NEUT%: 50.9 % (ref 38.4–76.8)
WBC: 4.5 10*3/uL (ref 3.9–10.3)
lymph#: 1.7 10*3/uL (ref 0.9–3.3)
nRBC: 0 % (ref 0–0)

## 2008-11-27 LAB — COMPREHENSIVE METABOLIC PANEL
ALT: 9 U/L (ref 0–35)
AST: 15 U/L (ref 0–37)
Albumin: 4.2 g/dL (ref 3.5–5.2)
BUN: 16 mg/dL (ref 6–23)
Calcium: 8.9 mg/dL (ref 8.4–10.5)
Chloride: 107 mEq/L (ref 96–112)
Potassium: 4.1 mEq/L (ref 3.5–5.3)

## 2008-12-12 ENCOUNTER — Encounter: Payer: Self-pay | Admitting: Internal Medicine

## 2008-12-19 ENCOUNTER — Ambulatory Visit (HOSPITAL_COMMUNITY): Admission: RE | Admit: 2008-12-19 | Discharge: 2008-12-19 | Payer: Self-pay | Admitting: Oncology

## 2008-12-25 ENCOUNTER — Encounter: Payer: Self-pay | Admitting: Internal Medicine

## 2008-12-25 LAB — CBC WITH DIFFERENTIAL/PLATELET
Eosinophils Absolute: 0 10*3/uL (ref 0.0–0.5)
HCT: 32.7 % — ABNORMAL LOW (ref 34.8–46.6)
LYMPH%: 31.1 % (ref 14.0–49.7)
MCHC: 34.6 g/dL (ref 31.5–36.0)
MONO#: 0.7 10*3/uL (ref 0.1–0.9)
NEUT%: 59 % (ref 38.4–76.8)
Platelets: 173 10*3/uL (ref 145–400)
WBC: 7 10*3/uL (ref 3.9–10.3)

## 2009-01-01 ENCOUNTER — Ambulatory Visit: Payer: Self-pay | Admitting: Internal Medicine

## 2009-01-01 DIAGNOSIS — I428 Other cardiomyopathies: Secondary | ICD-10-CM | POA: Insufficient documentation

## 2009-01-07 ENCOUNTER — Telehealth (INDEPENDENT_AMBULATORY_CARE_PROVIDER_SITE_OTHER): Payer: Self-pay | Admitting: Radiology

## 2009-01-08 ENCOUNTER — Ambulatory Visit: Payer: Self-pay

## 2009-01-09 ENCOUNTER — Encounter: Payer: Self-pay | Admitting: Internal Medicine

## 2009-01-17 ENCOUNTER — Encounter: Payer: Self-pay | Admitting: Internal Medicine

## 2009-01-22 ENCOUNTER — Ambulatory Visit: Payer: Self-pay | Admitting: Oncology

## 2009-01-24 ENCOUNTER — Encounter: Payer: Self-pay | Admitting: Internal Medicine

## 2009-01-30 ENCOUNTER — Ambulatory Visit: Payer: Self-pay | Admitting: Internal Medicine

## 2009-02-03 ENCOUNTER — Emergency Department (HOSPITAL_COMMUNITY): Admission: EM | Admit: 2009-02-03 | Discharge: 2009-02-03 | Payer: Self-pay | Admitting: Emergency Medicine

## 2009-02-06 ENCOUNTER — Ambulatory Visit: Payer: Self-pay | Admitting: Internal Medicine

## 2009-02-06 DIAGNOSIS — S8990XA Unspecified injury of unspecified lower leg, initial encounter: Secondary | ICD-10-CM

## 2009-02-06 DIAGNOSIS — R609 Edema, unspecified: Secondary | ICD-10-CM | POA: Insufficient documentation

## 2009-02-06 DIAGNOSIS — S99919A Unspecified injury of unspecified ankle, initial encounter: Secondary | ICD-10-CM

## 2009-02-06 DIAGNOSIS — S99929A Unspecified injury of unspecified foot, initial encounter: Secondary | ICD-10-CM | POA: Insufficient documentation

## 2009-02-08 ENCOUNTER — Telehealth (INDEPENDENT_AMBULATORY_CARE_PROVIDER_SITE_OTHER): Payer: Self-pay | Admitting: *Deleted

## 2009-02-08 ENCOUNTER — Encounter: Payer: Self-pay | Admitting: Internal Medicine

## 2009-02-08 ENCOUNTER — Encounter (INDEPENDENT_AMBULATORY_CARE_PROVIDER_SITE_OTHER): Payer: Self-pay | Admitting: *Deleted

## 2009-02-14 ENCOUNTER — Telehealth (INDEPENDENT_AMBULATORY_CARE_PROVIDER_SITE_OTHER): Payer: Self-pay | Admitting: *Deleted

## 2009-02-19 ENCOUNTER — Telehealth (INDEPENDENT_AMBULATORY_CARE_PROVIDER_SITE_OTHER): Payer: Self-pay | Admitting: *Deleted

## 2009-02-26 ENCOUNTER — Ambulatory Visit: Payer: Self-pay | Admitting: Internal Medicine

## 2009-03-01 ENCOUNTER — Encounter (INDEPENDENT_AMBULATORY_CARE_PROVIDER_SITE_OTHER): Payer: Self-pay | Admitting: *Deleted

## 2009-03-12 ENCOUNTER — Encounter (INDEPENDENT_AMBULATORY_CARE_PROVIDER_SITE_OTHER): Payer: Self-pay | Admitting: *Deleted

## 2009-04-08 ENCOUNTER — Encounter (INDEPENDENT_AMBULATORY_CARE_PROVIDER_SITE_OTHER): Payer: Self-pay | Admitting: Nurse Practitioner

## 2009-04-08 ENCOUNTER — Ambulatory Visit: Payer: Self-pay | Admitting: Internal Medicine

## 2009-04-23 ENCOUNTER — Ambulatory Visit: Payer: Self-pay | Admitting: Oncology

## 2009-04-25 ENCOUNTER — Telehealth (INDEPENDENT_AMBULATORY_CARE_PROVIDER_SITE_OTHER): Payer: Self-pay | Admitting: *Deleted

## 2009-04-26 ENCOUNTER — Encounter: Payer: Self-pay | Admitting: Internal Medicine

## 2009-04-26 LAB — CBC WITH DIFFERENTIAL/PLATELET
BASO%: 0.4 % (ref 0.0–2.0)
Basophils Absolute: 0 10*3/uL (ref 0.0–0.1)
HCT: 33.2 % — ABNORMAL LOW (ref 34.8–46.6)
HGB: 11.4 g/dL — ABNORMAL LOW (ref 11.6–15.9)
MCHC: 34.3 g/dL (ref 31.5–36.0)
MONO#: 0.3 10*3/uL (ref 0.1–0.9)
NEUT%: 52.9 % (ref 38.4–76.8)
WBC: 4.2 10*3/uL (ref 3.9–10.3)
lymph#: 1.6 10*3/uL (ref 0.9–3.3)

## 2009-04-29 LAB — COMPREHENSIVE METABOLIC PANEL WITH GFR
ALT: 9 U/L (ref 0–35)
AST: 15 U/L (ref 0–37)
Albumin: 4.5 g/dL (ref 3.5–5.2)
Alkaline Phosphatase: 48 U/L (ref 39–117)
BUN: 16 mg/dL (ref 6–23)
CO2: 27 meq/L (ref 19–32)
Calcium: 9.9 mg/dL (ref 8.4–10.5)
Chloride: 103 meq/L (ref 96–112)
Creatinine, Ser: 0.87 mg/dL (ref 0.40–1.20)
Glucose, Bld: 120 mg/dL — ABNORMAL HIGH (ref 70–99)
Potassium: 4.1 meq/L (ref 3.5–5.3)
Sodium: 139 meq/L (ref 135–145)
Total Bilirubin: 0.4 mg/dL (ref 0.3–1.2)
Total Protein: 7 g/dL (ref 6.0–8.3)

## 2009-04-29 LAB — LACTATE DEHYDROGENASE: LDH: 128 U/L (ref 94–250)

## 2009-04-30 ENCOUNTER — Ambulatory Visit: Payer: Self-pay | Admitting: Internal Medicine

## 2009-04-30 DIAGNOSIS — J019 Acute sinusitis, unspecified: Secondary | ICD-10-CM

## 2009-05-08 ENCOUNTER — Ambulatory Visit: Payer: Self-pay | Admitting: Internal Medicine

## 2009-05-22 ENCOUNTER — Ambulatory Visit (HOSPITAL_BASED_OUTPATIENT_CLINIC_OR_DEPARTMENT_OTHER): Admission: RE | Admit: 2009-05-22 | Discharge: 2009-05-22 | Payer: Self-pay | Admitting: Plastic Surgery

## 2009-05-22 ENCOUNTER — Encounter (INDEPENDENT_AMBULATORY_CARE_PROVIDER_SITE_OTHER): Payer: Self-pay | Admitting: Plastic Surgery

## 2009-06-25 ENCOUNTER — Encounter: Payer: Self-pay | Admitting: Internal Medicine

## 2009-07-05 ENCOUNTER — Ambulatory Visit: Payer: Self-pay | Admitting: Oncology

## 2009-07-09 LAB — COMPREHENSIVE METABOLIC PANEL
Alkaline Phosphatase: 62 U/L (ref 39–117)
BUN: 16 mg/dL (ref 6–23)
Creatinine, Ser: 0.89 mg/dL (ref 0.40–1.20)
Glucose, Bld: 98 mg/dL (ref 70–99)
Total Bilirubin: 0.6 mg/dL (ref 0.3–1.2)

## 2009-07-09 LAB — CBC WITH DIFFERENTIAL/PLATELET
Basophils Absolute: 0 10*3/uL (ref 0.0–0.1)
Eosinophils Absolute: 0 10*3/uL (ref 0.0–0.5)
HGB: 12 g/dL (ref 11.6–15.9)
LYMPH%: 40.1 % (ref 14.0–49.7)
MCV: 94.2 fL (ref 79.5–101.0)
MONO%: 8.7 % (ref 0.0–14.0)
NEUT#: 2.6 10*3/uL (ref 1.5–6.5)
Platelets: 259 10*3/uL (ref 145–400)
RBC: 3.76 10*6/uL (ref 3.70–5.45)

## 2009-07-09 LAB — CANCER ANTIGEN 27.29: CA 27.29: 32 U/mL (ref 0–39)

## 2009-07-30 ENCOUNTER — Ambulatory Visit: Payer: Self-pay | Admitting: Internal Medicine

## 2009-09-17 ENCOUNTER — Encounter: Payer: Self-pay | Admitting: Internal Medicine

## 2009-10-01 ENCOUNTER — Encounter: Payer: Self-pay | Admitting: Internal Medicine

## 2009-11-19 ENCOUNTER — Encounter: Payer: Self-pay | Admitting: Internal Medicine

## 2009-12-17 ENCOUNTER — Ambulatory Visit: Payer: Self-pay | Admitting: Internal Medicine

## 2010-01-01 ENCOUNTER — Ambulatory Visit: Payer: Self-pay | Admitting: Oncology

## 2010-01-02 LAB — COMPREHENSIVE METABOLIC PANEL
BUN: 19 mg/dL (ref 6–23)
CO2: 25 mEq/L (ref 19–32)
Calcium: 9.3 mg/dL (ref 8.4–10.5)
Chloride: 102 mEq/L (ref 96–112)
Creatinine, Ser: 0.87 mg/dL (ref 0.40–1.20)
Glucose, Bld: 101 mg/dL — ABNORMAL HIGH (ref 70–99)

## 2010-01-02 LAB — CBC WITH DIFFERENTIAL/PLATELET
Basophils Absolute: 0 10*3/uL (ref 0.0–0.1)
Eosinophils Absolute: 0.1 10*3/uL (ref 0.0–0.5)
HCT: 34.8 % (ref 34.8–46.6)
HGB: 11.7 g/dL (ref 11.6–15.9)
MCH: 31.6 pg (ref 25.1–34.0)
MONO#: 0.4 10*3/uL (ref 0.1–0.9)
NEUT%: 51.4 % (ref 38.4–76.8)
WBC: 4.6 10*3/uL (ref 3.9–10.3)
lymph#: 1.7 10*3/uL (ref 0.9–3.3)

## 2010-01-02 LAB — LACTATE DEHYDROGENASE: LDH: 141 U/L (ref 94–250)

## 2010-01-02 LAB — VITAMIN D 25 HYDROXY (VIT D DEFICIENCY, FRACTURES): Vit D, 25-Hydroxy: 36 ng/mL (ref 30–89)

## 2010-01-09 ENCOUNTER — Encounter: Payer: Self-pay | Admitting: Internal Medicine

## 2010-01-15 ENCOUNTER — Ambulatory Visit: Payer: Self-pay | Admitting: Internal Medicine

## 2010-01-15 ENCOUNTER — Encounter: Payer: Self-pay | Admitting: Internal Medicine

## 2010-01-15 ENCOUNTER — Ambulatory Visit: Payer: Self-pay

## 2010-01-15 ENCOUNTER — Ambulatory Visit: Payer: Self-pay | Admitting: Cardiology

## 2010-01-15 ENCOUNTER — Ambulatory Visit (HOSPITAL_COMMUNITY): Admission: RE | Admit: 2010-01-15 | Discharge: 2010-01-15 | Payer: Self-pay | Admitting: Internal Medicine

## 2010-04-09 ENCOUNTER — Encounter: Payer: Self-pay | Admitting: Internal Medicine

## 2010-07-04 ENCOUNTER — Ambulatory Visit: Payer: Self-pay | Admitting: Oncology

## 2010-07-08 LAB — COMPREHENSIVE METABOLIC PANEL
ALT: 13 U/L (ref 0–35)
AST: 22 U/L (ref 0–37)
CO2: 29 mEq/L (ref 19–32)
Sodium: 135 mEq/L (ref 135–145)
Total Bilirubin: 0.5 mg/dL (ref 0.3–1.2)
Total Protein: 6.9 g/dL (ref 6.0–8.3)

## 2010-07-08 LAB — CBC WITH DIFFERENTIAL/PLATELET
BASO%: 0.3 % (ref 0.0–2.0)
EOS%: 0.9 % (ref 0.0–7.0)
LYMPH%: 44.8 % (ref 14.0–49.7)
MCH: 32.4 pg (ref 25.1–34.0)
MCHC: 34.3 g/dL (ref 31.5–36.0)
MONO#: 0.4 10*3/uL (ref 0.1–0.9)
RBC: 3.67 10*6/uL — ABNORMAL LOW (ref 3.70–5.45)
WBC: 5.1 10*3/uL (ref 3.9–10.3)
lymph#: 2.3 10*3/uL (ref 0.9–3.3)

## 2010-07-08 LAB — LACTATE DEHYDROGENASE: LDH: 130 U/L (ref 94–250)

## 2010-07-15 ENCOUNTER — Encounter: Payer: Self-pay | Admitting: Internal Medicine

## 2010-09-28 LAB — CONVERTED CEMR LAB
ALT: 15 units/L (ref 0–35)
Alkaline Phosphatase: 54 units/L (ref 39–117)
BUN: 15 mg/dL (ref 6–23)
Basophils Absolute: 0.1 10*3/uL (ref 0.0–0.1)
Calcium: 9.3 mg/dL (ref 8.4–10.5)
Cholesterol: 244 mg/dL (ref 0–200)
Direct LDL: 143 mg/dL
Eosinophils Absolute: 0.1 10*3/uL (ref 0.0–0.6)
GFR calc Af Amer: 95 mL/min
GFR calc non Af Amer: 79 mL/min
HDL: 65.1 mg/dL (ref >39.0)
Hemoglobin: 12.5 g/dL (ref 12.0–15.0)
Hgb A1c MFr Bld: 5.5 % (ref 4.6–6.0)
Lymphocytes Relative: 39.2 % (ref 12.0–46.0)
MCHC: 33 g/dL (ref 30.0–36.0)
MCV: 90.2 fL (ref 78.0–100.0)
Monocytes Absolute: 1.8 10*3/uL — ABNORMAL HIGH (ref 0.2–0.7)
Monocytes Relative: 30.6 % — ABNORMAL HIGH (ref 3.0–11.0)
Neutro Abs: 1.6 10*3/uL (ref 1.4–7.7)
Platelets: 287 10*3/uL (ref 150–400)
Potassium: 4 meq/L (ref 3.5–5.1)
Saturation Ratios: 19.6 % — ABNORMAL LOW (ref 20.0–50.0)
TSH: 2.44 microintl units/mL (ref 0.35–5.50)
Total Protein: 6.8 g/dL (ref 6.0–8.3)
Transferrin: 298.8 mg/dL (ref >=212.0)
VLDL: 29 mg/dL (ref 0–40)

## 2010-10-01 ENCOUNTER — Encounter: Payer: Self-pay | Admitting: Internal Medicine

## 2010-10-02 NOTE — Letter (Signed)
[facility]   Imported By: Lanelle Bal 09/27/2009 07:58:41  _____________________________________________________________________  External Attachment:    Type:   Image     Comment:   External Document

## 2010-10-02 NOTE — Letter (Signed)
Summary: [facility]  [facility]   Imported By: Lester [place] 08/20/2010 12:21:08  _____________________________________________________________________  External Attachment:    Type:   Image     Comment:   External Document

## 2010-10-02 NOTE — Letter (Signed)
Summary: [facility]  [facility]   Imported By: Lanelle Bal 04/18/2010 14:11:18  _____________________________________________________________________  External Attachment:    Type:   Image     Comment:   External Document

## 2010-10-02 NOTE — Letter (Signed)
Summary: [facility]  [facility]   Imported By: Lanelle Bal 11/27/2009 11:09:58  _____________________________________________________________________  External Attachment:    Type:   Image     Comment:   External Document

## 2010-10-02 NOTE — Assessment & Plan Note (Signed)
Summary: rov/ gd   Visit Type:  Follow-up Primary Provider:  [name] MD  CC:  no complaints.  History of Present Illness: 60 y/o woman with history of breast CA and likly  chemo-induced LV dysfunction returns for routine f/u.   Diagnosed with L breast cancer in 4/09. Underwent bilat mastectomies. Then 11/12 cycles of chemo stopped due to neuropathy. Completed 11/09. Then was treated with Herceptin in Nov and April 2010. Had MUGA in 11/19/08 which showed EF 57% which was reported as being up from 50% previously. Repeat MUGA 4/21 after Herceptin showed EF 34%.  We saw her in May 2010 and did Myoview which showed EF 72% with normal perfusion.   Doing well. No CP or SOB. BP runs on low side but asx. No edema.   ECHO today whcih I reviewed EF 55-60%  Current Medications (verified): 1)  Lorazepam 1 Mg  Tabs (Lorazepam) .... 1/2 Tab By Mouth Q6 Hrs As Needed Stress Must Have Ov Before More 2)  Tramadol Hcl 50 Mg Tabs (Tramadol Hcl) .... Take Two Tablets By Mouth Two Times A Day 3)  Claritin 10 Mg  Tabs (Loratadine) .Marland Kitchen.. 1 Once Daily As Needed Allergies 4)  Ventolin Hfa 108 (90 Base) Mcg/act Aers (Albuterol Sulfate) .Marland Kitchen.. 1-2 Puffs Every 4 Hrs As Needed 5)  Celexa 40 Mg Tabs (Citalopram Hydrobromide) .... Take One Tablet By Mouth Once Daily. 6)  Femara 2.5 Mg Tabs (Letrozole) .... Take One Tablet By Mouth Once Daily. 7)  Carvedilol 3.125 Mg Tabs (Carvedilol) .... Take One Tablet By Mouth Twice A Day 8)  Flonase 50 Mcg/act Susp (Fluticasone Propionate) .... To His Sprays in Each Side of The Nose Daily  Allergies (verified): 1)  ! Doxycycline 2)  ! Effexor 3)  ! Augmentin Es-600  Vital Signs:  Patient profile:   60 year old female Height:      60 inches Weight:      94 pounds BMI:     18.42 Pulse rate:   71 / minute BP sitting:   98 / 58  (left arm) Cuff size:   regular  Vitals Entered By: Hardin Negus, RMA (Jan 15, 2010 3:35 PM)  Physical Exam  General:  Thin. looks  older than stated age. no resp difficulty HEENT: normal Neck: supple. no JVD. Carotids 2+ bilat; no bruits. No lymphadenopathy or thryomegaly appreciated. Cor: PMI nondisplaced. Regular rate & rhythm. No rubs, gallops, murmur. Lungs: clear with decreased air movement. no wheezes Abdomen: soft, nontender, nondistended. Peri Jefferson bowel sounds. Extremities: no cyanosis, clubbing, rash, edema Neuro: alert & orientedx3, cranial nerves grossly intact. moves all 4 extremities w/o difficulty. affect pleasant   Impression & Recommendations:  Problem # 1:  CARDIOMYOPATHY, PRIMARY, DILATED (ICD-425.4) EF recovered for 1 year now. Will continue low-dose b-block. Told her to contact us if she develops dyspnea, edema or will need more chemo. Otherwise will see her back in 1 year with echo.   Other Orders: EKG w/ Interpretation (93000)  Patient Instructions: 1)  Follow up in 1 year

## 2010-10-02 NOTE — Letter (Signed)
[facility]   Imported By: Lanelle Bal 01/28/2010 13:57:44  _____________________________________________________________________  External Attachment:    Type:   Image     Comment:   External Document

## 2010-10-02 NOTE — Letter (Signed)
[facility]: [facility]   Imported By: Lanelle Bal 10/17/2009 09:08:33  _____________________________________________________________________  External Attachment:    Type:   Image     Comment:   External Document

## 2010-10-22 NOTE — Letter (Signed)
Summary: [facility]   Imported By: Maryln Gottron 10/13/2010 13:47:26  _____________________________________________________________________  External Attachment:    Type:   Image     Comment:   External Document

## 2010-11-01 IMAGING — NM NM CARDIA MUGA REST
3 series · 18 of 18 positions shown · non-contrast
Comparison: MUGA scan 03/28/2008

CLINICAL DATA: Breast cancer.  Status post chemotherapy

NUCLEAR MEDICINE CARDIAC MULTIPLE UPTAKE GATED ACQUISITION SCAN
TECHNIQUE: Radiolabeled red blood cells used to perform resting
radionuclide ventriculography. Imaging performed in the anterior,
LAO, and lateral projections. Resting left ventricular ejection
fraction estimated after drawing region of interest curves around
the left ventricle during systole and diastole.
Radiopharmaceutical: 20.8 millicuries technetium 99 labeled red
blood cells

[Series 1: mu muga · 4.34mm/px · 6 of 16 frames shown (1 of 3)]
[frame 2/16]
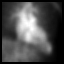
[frame 4/16]
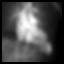
[frame 7/16]
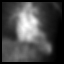
[frame 10/16]
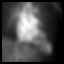
[frame 12/16]
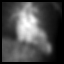
[frame 15/16]
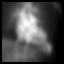

[Series 1: mu muga · 4.34mm/px · 6 of 16 frames shown (2 of 3)]
[frame 2/16]
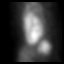
[frame 4/16]
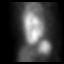
[frame 7/16]
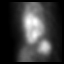
[frame 10/16]
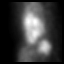
[frame 12/16]
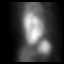
[frame 15/16]
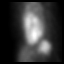

[Series 1: mu muga · 4.34mm/px · 6 of 16 frames shown (3 of 3)]
[frame 2/16]
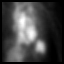
[frame 4/16]
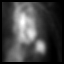
[frame 7/16]
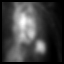
[frame 10/16]
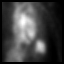
[frame 12/16]
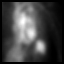
[frame 15/16]
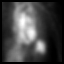

[18 of 18 positions shown; findings below may reference images not displayed]

FINDINGS: No focal wall motion abnormality of the left ventricle.

Calculated left ventricular ejection fraction equals 51%.
Decreased from 61% on prior
IMPRESSION: 1. No focal wall motion abnormality.
2.  Left ventricular ejection fraction equal 51%.

## 2010-12-05 LAB — POCT I-STAT, CHEM 8
BUN: 14 mg/dL (ref 6–23)
Calcium, Ion: 1.25 mmol/L (ref 1.12–1.32)
Chloride: 103 mEq/L (ref 96–112)
Creatinine, Ser: 0.8 mg/dL (ref 0.4–1.2)
Glucose, Bld: 99 mg/dL (ref 70–99)
TCO2: 27 mmol/L (ref 0–100)

## 2011-01-06 ENCOUNTER — Other Ambulatory Visit: Payer: Self-pay | Admitting: Oncology

## 2011-01-06 ENCOUNTER — Encounter (HOSPITAL_BASED_OUTPATIENT_CLINIC_OR_DEPARTMENT_OTHER): Payer: 59 | Admitting: Oncology

## 2011-01-06 DIAGNOSIS — M810 Age-related osteoporosis without current pathological fracture: Secondary | ICD-10-CM

## 2011-01-06 DIAGNOSIS — C50919 Malignant neoplasm of unspecified site of unspecified female breast: Secondary | ICD-10-CM

## 2011-01-06 DIAGNOSIS — C50119 Malignant neoplasm of central portion of unspecified female breast: Secondary | ICD-10-CM

## 2011-01-06 LAB — CBC WITH DIFFERENTIAL/PLATELET
BASO%: 0.5 % (ref 0.0–2.0)
HCT: 32.8 % — ABNORMAL LOW (ref 34.8–46.6)
LYMPH%: 37.5 % (ref 14.0–49.7)
MCHC: 34.2 g/dL (ref 31.5–36.0)
MCV: 93.9 fL (ref 79.5–101.0)
MONO#: 0.4 10*3/uL (ref 0.1–0.9)
MONO%: 8.7 % (ref 0.0–14.0)
NEUT%: 52.1 % (ref 38.4–76.8)
Platelets: 280 10*3/uL (ref 145–400)
RBC: 3.5 10*6/uL — ABNORMAL LOW (ref 3.70–5.45)
WBC: 5 10*3/uL (ref 3.9–10.3)

## 2011-01-07 LAB — COMPREHENSIVE METABOLIC PANEL
ALT: 12 U/L (ref 0–35)
Alkaline Phosphatase: 46 U/L (ref 39–117)
CO2: 26 mEq/L (ref 19–32)
Creatinine, Ser: 0.92 mg/dL (ref 0.40–1.20)
Glucose, Bld: 50 mg/dL — ABNORMAL LOW (ref 70–99)
Sodium: 139 mEq/L (ref 135–145)
Total Bilirubin: 0.4 mg/dL (ref 0.3–1.2)

## 2011-01-07 LAB — VITAMIN D 25 HYDROXY (VIT D DEFICIENCY, FRACTURES): Vit D, 25-Hydroxy: 47 ng/mL (ref 30–89)

## 2011-01-13 ENCOUNTER — Encounter (HOSPITAL_BASED_OUTPATIENT_CLINIC_OR_DEPARTMENT_OTHER): Payer: 59 | Admitting: Oncology

## 2011-01-13 DIAGNOSIS — C50919 Malignant neoplasm of unspecified site of unspecified female breast: Secondary | ICD-10-CM

## 2011-01-13 DIAGNOSIS — C50119 Malignant neoplasm of central portion of unspecified female breast: Secondary | ICD-10-CM

## 2011-01-13 DIAGNOSIS — M810 Age-related osteoporosis without current pathological fracture: Secondary | ICD-10-CM

## 2011-01-13 NOTE — Op Note (Signed)
NAMESAFIYA, GIRDLER             ACCOUNT NO.:  [id number]   MEDICAL RECORD NO.:  [id number]          PATIENT TYPE:  INP   LOCATION:  5159                         FACILITY:  [facility]   PHYSICIAN:  Loreta Ave, MD DATE OF BIRTH:  Aug 05, 1951   DATE OF PROCEDURE:  08/08/2008  DATE OF DISCHARGE:                               OPERATIVE REPORT   PREOPERATIVE DIAGNOSIS:  Left breast cancer.   POSTOPERATIVE DIAGNOSIS:  Left breast cancer.   OPERATION PERFORMED:  Placement of bilateral tissue expanders.   ANESTHESIA:  General endotracheal anesthesia.   COMPLICATIONS:  None.   ESTIMATED BLOOD LOSS:  During my portion of the procedure, minimal.   DRAINS:  Jackson-Pratt x4, two in each breast.   SPECIMENS:  None.   CLINICAL INDICATIONS:  Ozelle Brubacher is a 60 year old female with left-  sided breast cancer.  Today, she presents for a left therapeutic  mastectomy with left sentinel lymph node biopsy and right prophylactic  mastectomy, this portion of the procedure is performed by Dr. Jamey Ripa.   After a thorough discussion of the risks and benefits of reconstructive  surgery with bilateral tissue expanders and FlexHD, the risks including  bleeding, infection, damage to the nearby structures, breast asymmetry,  prolonged healing, loss of mastectomy flaps, risk of implant failure,  the patient understood these risks and desired to proceed.   DESCRIPTION OF THE OPERATION:  I was called into the operating room by  Dr. Jamey Ripa, as he completed the left mastectomy and received  confirmation from pathology that this sentinel lymph node was negative  for malignancy.  At that point, he handed the operation over to me.  I  began on the right side and irrigated the right mastectomy defect with  normal saline.  Hemostasis was then verified with electrocautery.  I  began dissection on the lateral aspect of the right pectoralis muscle to  create an implant pocket in the subpectoral plane with  electrocautery.  The inferior margin of the pectoralis muscle was released to the level  of its sternal attachment.  The subpectoral pocket was then inspected  and hemostasis assured with electrocautery.  Next, a 4 x 16 cm piece of  FlexHD epidermis side down, was then trimmed to fit the defect between  the inferior margin of the pectoralis muscle and the level of the  inframammary fold.  A 3-0 PDS suture was then used in a running fashion  to attach the upper edge of the FlexHD to the inferior border of the  pectoralis muscle from the level of the sternum to the lateral breast.  A 3-0 PDS suture was used in running fashion along the inferior margin  of the FlexHD attaching it to the level the inframammary fold.  The  right breast Allergan tissue expander model #133MV, serial #[id number] was  then brought into the sterile field and maintained in a bath of  bacitracin-containing normal saline.  A 21-gauge butterfly needle was  used via the port to aspirate all the air out of the tissue expander.  The expander was then placed in the subpectoral plane using a no-touch  technique.  Two horizontal mattress sutures of PDS were used to tack the  lateral aspect of the FlexHD to the lateral chest wall thereby confining  the implants to the subpectoral plane.  The entire breast cavity was  then irrigated with bacitracin-containing normal saline.  Two 19-French  Blake drains were placed at the level of the mid axillary line via  separate stab incisions and sutured with 2-0 silk sutures.  The skin was  then closed with interrupted, buried 3-0 Monocryl sutures, and a 4-0  subcuticular Monocryl suture was then placed.   The attention next was turned to the left breast cavity and hemostasis  verified and assured with electrocautery.  The lateral aspect of the  left pectoralis muscle was then elevated with electrocautery and the  subpectoral plane was entered.  The left pectoralis muscle was released   from its inferior costal attachment to the level of the sternum, and the  pectoralis major muscle was elevated off the chest wall with  electrocautery to create a subpectoral pocket.  Hemostasis was obtained  with electrocautery.  Another piece of 4 x 16 FlexHD was then trimmed to  fit the left-sided defect between the pectoralis muscle and the  inframammary fold.  This was inset epidermis side down.  A 3-0 PDS  suture was used in running fashion from the sternal attachments of the  pectoralis muscle along the inferior border of the pectoralis muscle to  the lateral side of the breast.  Another 3-0 PDS suture was used in  running fashion to adhere the FlexHD to the inframammary fold.  The  Allergan tissue expander with serial [id number], model #133MV was brought  into the sterile field and kept in a bath of bacitracin-containing  normal saline.  A 21-gauge butterfly needle was then used to aspirate  all the air out of the implant.  Using a no-touch technique, the implant  was then placed in the subpectoral plane.  Two horizontal mattress  sutures of 3-0 PDS were then used to tack the lateral aspect of the  FlexHD to the lateral chest wall, thereby confining the implant on the  subpectoral pocket.  The left mastectomy defect was then irrigated with  bacitracin-containing normal saline.  Two 19-French round Blake drains  were placed via separate stab incisions at the level of the midaxillary  line.  These were sutured in place with 2-0 silk sutures.  There was no  fill in either implant during the operation.  The skin was then closed  with interrupted, buried 3-0 Monocryl sutures in a running subcuticular  4-0 Monocryl suture.  At the end of the operation, needle and sponge  counts were reported as correct x2.  Dermabond, Steri-Strips, and  sterile dressings were applied.  The patient was extubated without  incident, transported to the recovery room in stable condition.      Loreta Ave, MD  Electronically Signed     CF/MEDQ  D:  08/08/2008  T:  08/09/2008  Job:  [id number]

## 2011-01-13 NOTE — Op Note (Signed)
[name], [name]             ACCOUNT NO.:  [id number]   MEDICAL RECORD NO.:  [id number]          PATIENT TYPE:  INP   LOCATION:  5159                         FACILITY:  [facility]   PHYSICIAN:  Currie Paris, M.D.DATE OF BIRTH:  [birth year]   DATE OF PROCEDURE:  08/08/2008  DATE OF DISCHARGE:                               OPERATIVE REPORT   PREOPERATIVE DIAGNOSIS:  Carcinoma left breast, clinical stage II status  post neoadjuvant chemotherapy.   POSTOPERATIVE DIAGNOSIS:  Carcinoma left breast, clinical stage II  status post neoadjuvant chemotherapy.   OPERATION:  1. Right prophylactic mastectomy (skin sparing).  2. Left total mastectomy with blue dye injection and axillary sentinel      lymph node biopsy.   SURGEON:  Currie Paris, MD   ASSISTANT:  Lennie Muckle, MD   ANESTHESIA:  General endotracheal.   CLINICAL HISTORY:  This is a 60 year old lady who has had a complete  clinical response from her neoadjuvant chemotherapy for her left breast  cancer.  She elected at this time to have a right mastectomy as well as  a left mastectomy.   DESCRIPTION OF PROCEDURE:  The patient was seen in the holding area and  she had no further questions.  We identified and marked the left side  for the sentinel lymph node.   The patient was taken to the operating room.  After satisfactory general  anesthesia had been obtained, the breasts were prepped and draped and  the time-out was done.   A survey on the right side, I made a transverse incision encircling the  nipple and extending a little bit in either direction.  We took no extra  skin other than nipple-areolar complex.   I raised a thin skin flap superiorly towards the clavicle, medial to the  sternum, inferiorly to the inframammary fold and laterally to the  latissimus.  I took care not to enter the capsule around the Port-a-Cath  which is in the right anterior chest wall.  The breast was then removed  from medial to  lateral using coagulation cautery.  The axilla was not  entered.  The specimen was oriented for pathology.  A moist pack was  placed.  The patient was then approached through the left side.  After  the time-out had been done and the blue dye had been injected at that  point I massaged it.   I identified a hot area in the axilla with a Neoprobe and made a skin  mark over that.  Here I made an elliptical incision to take a little of  the skin just above the areola because that is where the tumor had been  and I want to be sure we had a good margin.  I raised the skin flaps  identical to the right side for the superior and out into the axilla.  I  found a blue lymphatic traced that into the axilla and identified,  dissected out 4 blue lymph nodes, the hottest of which was 1300.  With  all 4 of these out, there were no counts above  background of 15-20.  We  quit taking any further ones out.  There was no palpable adenopathy and  no other blue nodes.   The inferior flap was made similar to the right side and the breast  removed from medial to lateral some off the right side to make sure to  take the fascia here.  Specimens were oriented for pathology.  I made  sure everything was dry and placed a moist pack.  Pathology reported  that the lymph nodes were negative.   At this point, Dr. Noelle Penner came in and proceeded to do his  reconstructions.  The patient tolerated my portion of the procedure well  with an estimated blood loss of about 100 mL.  There were no  complications.      Currie Paris, M.D.  Electronically Signed     CJS/MEDQ  D:  08/08/2008  T:  08/09/2008  Job:  [id number]

## 2011-01-13 NOTE — Op Note (Signed)
[name], Peterson             ACCOUNT NO.:  [id number]   MEDICAL RECORD NO.:  [id number]          PATIENT TYPE:  INP   LOCATION:  5159                         FACILITY:  [facility]   PHYSICIAN:  Loreta Ave, MD DATE OF BIRTH:  1951-08-19   DATE OF PROCEDURE:  08/29/2008  DATE OF DISCHARGE:                               OPERATIVE REPORT   PREOPERATIVE DIAGNOSES:  1. Breast cancer.  2. Acquired absence of the bilateral breasts, status post immediate      breast reconstruction with tissue expanders.   SURGEON:  Loreta Ave, MD   ASSISTANT:  Enedina Finner.   ANESTHESIA:  General endotracheal anesthesia.   IV FLUIDS:  Per Anesthesia notes.   URINE OUTPUT:  Per Anesthesia notes.   ESTIMATED BLOOD LOSS:  175 mL.   COMPLICATIONS:  None.   SPECIMENS:  Stat Gram-stain of fluid from each breast cavity was sent to  Microbiology labeled left and right.  Both were for stat Gram-stain and  for tissue culture.  There was one specimen from the left breast which  was necrotic skin and one specimen from the opposite breast which was  also necrotic skin.   CLINICAL INDICATIONS:  [name] is a 60 year old female with  breast cancer.  She underwent bilateral mastectomies with immediate  placement of tissue expanders with HD-Flex approximately 2-1/2 weeks  ago.  Since the time of her mastectomy, she has developed mastectomy  flap necrosis bilaterally.  There is no immediate threat of implant  exposure, but the concern is that her implant eventually will become  exposed as she loses tissue and it sloughs.  She presents at this time  for bilateral latissimus myocutaneous flap reconstruction for salvage of  her implants.   After a thorough discussion of the risks of surgery which include but  are not limited to bleeding, infection, damage to the nearby structures,  partial or complete flap loss, breast asymmetry and eventual need for  future surgery, [name] accepts these  risks and desires to proceed.   DESCRIPTION OF THE OPERATION:  The patient was brought to the operating  room and placed in the supine position on the operating room table.  She  was given 1 g of Ancef preoperatively and SCDs were on and working at  induction of endotracheal anesthesia.  After a smooth and routine  induction of general endotracheal anesthesia, the patient's chest was  prepped with Betadine scrub and paint from the level of the neck to the  mid abdomen.  Attention was first turned to her mastectomy flap necrosis  on the left.  This measured 8.5 x 3 cm before debridement.  A 10 blade  was used to excise her necrotic skin with a thin rim of healthy tissue.  All edges were fresh with bleeding at the end of debridement.  This  specimen was passed off the table as left mastectomy scar.  Inspection  of the underlying muscle revealed that it was intact and the HD-Flex was  beginning to be revascularized and had a light pink color.  A sample of  her peri-prosthetic fluid was obtained and sent  for stat gram stain and  culture.   Attention was then turned to the right breast and this measured 7 X 3 cm  of necrosis.  This too was debrided with a 10 blade and passed off the  field as a separate specimen and labeled right mastectomy scar.  This  was down to bleeding tissue circumferentially.  There was a small area  of necrotic muscle which was also debrided.  A sample of her peri-  prosthetic fluid was obtained and sent for stat gram stain and culture.  The wounds were then copiously irrigated with normal saline.  A moist  lap sponge was placed on each breast wound and covered with an OpSite  sterile dressing.  At this point, the sterile field was broken and the  patient was turned to the right lateral decubitus position.   The patient was then prepped with Betadine scrub and paint over her  sterile dressing anteriorly.  The sterile field was from the level of  the neck to the  level of the lower abdomen and from midline anteriorly  to the spine posteriorly.  Care was taken to pad all bony prominences  and place an axillary roll.  Initially, the sterile dressing over the  left breast was removed and the breast wound was measured to be 9 x 5 cm  after it had retracted.  A template was made of this wound, and this was  transposed onto the skin overlying the left latissimus muscle.  A lap  sponge was used to judge the arc of rotation around the posterior aspect  of the axilla.  After the template was transferred to the skin with a  surgical marker, an incision was made on the superior aspect of the skin  overlying the latissimus muscle.  The incision was made with a 10 blade,  beveled away from the skin island, and dissection proceeded around the  anterior aspect of the muscle using electrocautery.  Next, the inferior  aspect of the template on the back skin was then incised with a 10 blade  again beveling away from the skin island.  Dissection proceeded medially  and inferiorly with electrocautery.  Once the muscle was isolated on its  superficial surface, dissection proceeded laterally from superiorly to  inferiorly to come around the anterior aspect of the muscle.  The muscle  was then divided inferiorly approximately 5 cm above the posterior-  superior iliac spine.  This was done within 3 - 4 cm of the spinous  processes.  Dissection then proceeded from inferior to superior along  the medial aspect of the muscle.  Electrocautery was also used to  elevate the muscle from inferior to superior off the deep back muscles.  Large perforating vessels were clipped and divided with tenotomy  scissors.  At the level of the inferior border of the scapula, care was  taken to not raise any additional musculature with the latissimus.  The  latissimus muscle was dissected circumferentially toward the axilla.  Once I judged it to be free, a tunnel was created linking the back  wound  with her left mastectomy defect.  Care was taken to make sure that this  was at least twice the width of the muscle at that location to secure  tension-free passage of the muscle through this window.  The back wound  was then irrigated with normal saline and hemostasis assured with  electrocautery.  Two 19-French Blake drains were placed at the  inferolateral portion  of the wound and secured with silk sutures.  The  superficial fascia of the back was then closed with 3-0 interrupted  buried PDS sutures.  The dermis was then closed with interrupted, buried  3-0 Monocryl sutures.  Dermabond and Steri-Strips were then applied.  A  sterile OpSite dressing was placed over the latissimus muscle which had  been delivered through to the mastectomy defect.  The sterile field was  then broken, and the patient was transferred to the lateral decubitus  position, left side down.  Care was taken to pad all of her bony  prominences and ensure that the axillary wall was not compressing the  pedicle off the flap.   The patient's right hemithorax was then prepped with Betadine scrub and  paint from the level of the neck to the lower mid abdomen and from the  level of the midline anteriorly to the midline posteriorly.   Initially, the right breast dressing was removed and a template was  designed of the right mastectomy defect.  At this point, this defect was  7 x 5 cm.  A lap sponge was used to judge the arc of rotation of the  right latissimus muscle with its fulcrum at the level of the posterior  axilla.  The anterior aspect of this template was then incised with a 10  blade and dissection proceeded down to the level of the latissimus  fascia with electrocautery.  The muscle was then dissected on its  superficial and anterior surface.  The inferior aspect of the template  was then incised with a 10 blade, beveled away, and dissection proceeded  on the superficial aspect of the latissimus muscle  in its entirety.  Electrocautery was then used to free the muscle on its anterior surface  and it was divided inferiorly with electrocautery.  Electrocautery was  then used to divide it on its medial attachments and this proceeded from  the inferior to superior with electrocautery.  Large perforating vessels  were clipped and divided with tenotomy scissors.  At the level of the  inferior border of the scapula, care was taken not to raise any  additional musculature with the latissimus muscle.  Dissection proceeded  up towards the posterior axilla until it was judged that the muscle  would have a tension-free arc of rotation.  At this point, a  subcutaneous tunnel was created with electrocautery taking care to make  sure that it sizes at least twice of the muscle at that location.  The  muscle was then passed into the right mastectomy defect.  The posterior,  back incision was irrigated with normal saline and hemostasis verified  with electrocautery.  Two 19-French round Blake drains were then placed  via separate stab incisions at the inferior portion of the wound close  to the mid axillary line.  These were secured with silk sutures.  The  superficial fascia of the back was then closed with interrupted, buried  3-0 PDS sutures.  The dermis was then closed with interrupted, buried 3-  0 Monocryl sutures.  Dermabond and Steri-Strips were applied.  A  moistened lap sponge was then placed over the flap in the right  mastectomy defect and as was an OpSite dressing.  The sterile field was  then broken, and the patient was rotated into the supine position.   The patient's chest was once again prepped with Betadine scrub and  paint.  Both stat gram stains were reported by microbiology as not  containing  any organisms.  This was from the level of the neck to the  mid abdomen and from mid axillary line to mid axillary line.  This was  draped into a sterile field.  Attention was first turned to the  right  breast where the OpSite dressing was removed.  The OpSite dressing was  also removed from the left breast at this point.  The right breast was  examined for size at this point with the tissue expander in place and we  judged that she would be too small without her tissue expanders  undergoing subsequent inflation for her desired projected breast volume.  The HD-Flex was released from the inferior border of the pectoralis  major muscle and the old PDS suture at this location was taken off the  field.  The HD flex was left in place to reinforce the inframmary fold.  The latissimus muscle was gently draped over the expander and the defect  from her mastectomy flap necrosis.  The muscle was inset with a few 2-0  Vicryl sutures.  The skin was then inset into the mastectomy flap defect  and this was found to have a good match.  The skin was closed with  interrupted 3-0 buried Monocryl sutures.  A 4-0 subcuticular Monocryl  suture was then run around the mastectomy defect.  At this point, the  flap was inspected again and found to be pink with 1.5 seconds capillary  refill.  Attention was then turned to the left breast.   The left mastectomy defect was inspected and the old PDS suture  connecting the HD-Flex to the inferior border of the pectoralis major  muscle was removed and passed off the field.  A flex was left in the  inferior portion of the wound and tacked to the level of the  inframammary fold.  The left latissimus muscle was then draped over the  mastectomy flap defect and a few 2-0 Vicryl sutures were used to tack  the muscle in its location.  The skin was then closed with interrupted 3-  0 Monocryl sutures and the skin of the muscle flap was found to fit well  into the defect.  A 4-0 subcuticular Monocryl suture was then used  circumferentially to close the dermis.  The left latissimus muscle at  this point was noted to have a pink color without evidence of  congestion.  It  had approximately 1.5 seconds of capillary refill.  Dermabond and Steri-Strips were applied to both mastectomy flap defects.  At this point, the sponge and needle count was reported as correct x2  and a sterile dressing was applied to both breasts.  The patient was  extubated and transported to the recovery room in stable condition.      Loreta Ave, MD  Electronically Signed     CF/MEDQ  D:  08/29/2008  T:  08/30/2008  Job:  [id number]

## 2011-01-13 NOTE — Discharge Summary (Signed)
NAMEFALINE, LANGER             ACCOUNT NO.:  [id number]   MEDICAL RECORD NO.:  [id number]          PATIENT TYPE:  INP   LOCATION:  5159                         FACILITY:  [facility]   PHYSICIAN:  Loreta Ave, MD DATE OF BIRTH:  Aug 25, 1951   DATE OF ADMISSION:  08/29/2008  DATE OF DISCHARGE:  09/01/2008                               DISCHARGE SUMMARY   REASON FOR HOSPITALIZATION:  [name] is a 60 year old female  with breast cancer.  Approximately 3 weeks ago, she underwent bilateral  mastectomies with immediate breast reconstruction with placement of  tissue expanders.  During her recovery from that surgery, she developed  bilateral mastectomy flap necrosis with threatened implant exposure.  For that reason, she was returned to the operating room on August 29, 2008, for debridement of her bilateral mastectomy flaps and for  bilateral latissimus myocutaneous flaps for implant salvage.   ADMISSION DIAGNOSES:  1. Breast cancer.  2. Acquired absence of bilateral breasts.  3. Acute blood loss anemia after her first surgery.   PROCEDURES PERFORMED:  On August 29, 2008, the patient was taken to  the operating room by me where I debrided her bilateral mastectomy flaps  and rotated bilateral latissimus myocutaneous flaps for implant salvage.  She tolerated the procedure well and was transferred to the floor in  stable condition.  Of note, her intraoperative gram-stain of the deep  breast wounds revealed no evidence of bacterial proliferation.  Culture  is still pending at the time for discharge, but there was no growth from  either breast to date.   DESCRIPTION OF HER HOSPITAL COURSE:  After undergoing her surgery, the  patient was transferred to the floor in stable condition.  Her diet was  advanced as tolerated.  She was mobilized on postoperative day #2 after  not feeling up to it on POD #1.  At the time of discharge, she is  tolerating a regular diet, she is  ambulating independently, she is  voiding spontaneously, she is comfortable on oral Dilaudid.  Throughout  her hospitalization, she has remained afebrile with stable vital signs.  On her discharge instructions forms, she is to follow up with Dr. Noelle Penner  on September 11, 2008.  She has been given instructions that she may  increase her activity slowly.  She may shower, but not tub bathe.  She  is not to do any heavy lifting or drive a car.  She is restricted from  doing any overhead activities with either of her arms.  Wound care:  She  is to let her Steri-Strips fall off on their own.  If she experiences  any drainage, she may cover with a dry sterile gauze.  I also told her  to look for changes in her flaps namely change in color, temperature,  any noticeable fullness in either breast.  She will call me immediately  if she notices and change in the flaps or has any question, fevers,  uncontrolled pain, or nausea.  She will measure and record her drain  output daily.  She is being given instructions to resume her home  medications.  She has been given a prescription for Dilaudid 2 mg 1-2  tablets p.o. q.4 h. p.r.n. for pain and she will take Keflex 500 mg p.o.  q.6 h. for 3 days.  She already has her prescription for Keflex, having  not completed her previously prescribed course.      Loreta Ave, MD  Electronically Signed    CF/MEDQ  D:  09/01/2008  T:  09/01/2008  Job:  [id number]

## 2011-01-13 NOTE — Op Note (Signed)
NAME:  [name], [name]             ACCOUNT NO.:  [id number]   MEDICAL RECORD NO.:  [id number]          PATIENT TYPE:  OUT   LOCATION:  XRAY                         FACILITY:  [facility]   PHYSICIAN:  Gabrielle Dare. Janee Morn, M.D.DATE OF BIRTH:  01-17-1951   DATE OF PROCEDURE:  12/29/2007  DATE OF DISCHARGE:  12/27/2007                               OPERATIVE REPORT   PREOPERATIVE DIAGNOSIS:  Left breast cancer.   POSTOPERATIVE DIAGNOSIS:  Left breast cancer.   PROCEDURE:  Insertion of Port-a-Cath via right subclavian vein.   SURGEON:  Gabrielle Dare. Janee Morn, M.D.   ANESTHESIA:  MAC.   HISTORY OF PRESENT ILLNESS:  Ms. Luepke is a 60 year old patient of  Dr. Cyndia Bent with an invasive carcinoma of the left breast.  She  has been seen by Dr. Pierce Crane from Oncology.  They planned  neoadjuvant therapy.  According to Dr. Donnie Coffin, no sentinel node is  requested at this time.  Dr. Jamey Ripa is out of town and in order to  expedite her care, we are proceeding with the placement of her Port-a-  Cath today.   PROCEDURE IN DETAIL:  An informed consent was obtained from the patient.  Discussed things in detail with her and her husband.  She received  intravenous antibiotics, first planned site was marked.  She was brought  to the operating room and MAC anesthesia was administered by the  anesthesia staff.  Her chest and neck bilaterally were prepped and  draped in sterile fashion.  A 0.25% Marcaine with epinephrine mixed with  1% lidocaine plain was injected.  First, underneath the lateral  clavicle, subclavian vein was then accessed easily with one stick.  Guide wire was placed and checked to be in position, SVC on fluoroscopy.  We then numbed a planned area for the pocket caudal from the end of the  right upper chest as well as a planned tunneling tract.  Transverse  incision was made at the planned pocket site for the port.  Subcutaneous  tissues were dissected down in a caudal direction  dissecting out a nice  pocket for the port.  Hemostasis was obtained with the Bovie cautery.  The port fit nicely.  The catheter was then measured on top of the  patient and cut the length and was temporarily attached.  A small  incision was made next to wire coming out of the clavicle, the tunneler  was used to pass the trimmed catheter from the subclavian position down  the pocket.  The catheter was temporarily attached to the port.  An  introducer sheath was then placed over the guidewire with a dilator.  The position was checked on fluoro as well as the length of the  catheter.  Both were good, the dilator and guidewire were removed and  the catheter was carefully threaded down the sheath and the peal away  sheath was then removed holding the catheter in place.  The catheter was  rechecked for position and the tip of the catheter was in the distal  superior vena cava.  The port catheter flange was snapped in place  fixing  the catheter to the port.  The port was placed back in the pocket  and was secured with 2-0 Prolene sutures and to the subcutaneous tissues  and hemostasis was ensured.  Subcutaneous tissues were closed with a  running 3-0 Vicryl suture and port was then flushed first with saline  and drew back easily and flushed nicely and then with 2 mL of  concentrated heparin solution.  Skin was closed with the running 4-0  Monocryl subcuticular stitch at both the pocket  site and the small subclavian incision site and Dermabond was placed in  both incisions.  Sponge, needle, and instrument counts were all correct.  The patient tolerated the procedure well without apparent complication.  We will check a stat portable chest x-ray in recovery room.      Gabrielle Dare Janee Morn, M.D.  Electronically Signed     BET/MEDQ  D:  12/29/2007  T:  12/30/2007  Job:  [id number]   cc:   Pierce Crane, MD

## 2011-01-13 NOTE — Discharge Summary (Signed)
[name], [name]             ACCOUNT NO.:  [id number]   MEDICAL RECORD NO.:  [id number]          PATIENT TYPE:  INP   LOCATION:  5159                         FACILITY:  [facility]   PHYSICIAN:  Loreta Ave, MD DATE OF BIRTH:  May 27, 1951   DATE OF ADMISSION:  08/08/2008  DATE OF DISCHARGE:  08/11/2008                               DISCHARGE SUMMARY   ADMISSION DIAGNOSIS:  Breast cancer.   ADDITIONAL DIAGNOSIS:  Anxiety disorder.   DISCHARGE DIAGNOSIS:  Breast cancer.   SURGERIES:  On August 08, 2008, the patient underwent bilateral  mastectomies and left sentinel lymph node biopsy by Dr. Jamey Ripa and  bilateral placement of tissue expanders by Dr. Noelle Penner.   ADMISSION HISTORY OF PRESENT ILLNESS:  Ms. [name] is a 60-year-  old Caucasian female with left-sided breast cancer.  She elected to  undergo prophylactic right mastectomy at the time of her left mastectomy  for risk reduction.   MEDICATIONS ON ADMISSION:  The patient takes,  1. Tramadol 50 mg p.o. b.i.d.  2. Zanaflex 4 mg p.o. daily.  3. Protonix 40 mg p.o. daily.  4. Lexapro 20 mg p.o. daily.  5. Claritin 10 mg p.o. daily.   DISCHARGE MEDICATIONS:  Her discharge medications include her  preadmission medications.  She is additionally given prescription for  Keflex 500 mg p.o. q.6 h. x7 days, Colace 100 mg p.o. q.12 h. while  taking opioids, and Dilaudid 2 mg 1 - 3 tabs every 3-4 hours p.r.n. for  pain.   HOSPITAL COURSE:  On August 08, 2008, [name] was admitted  to [facility] and taken to the operating room where Dr. Jamey Ripa  performed bilateral mastectomies and left sentinel lymph node biopsy.  After the completion of his procedure, Dr. Noelle Penner performed placement of  bilateral tissue expanders.  The patient tolerated the procedure well,  was extubated and transferred to the floor in stable condition.  On  postoperative day #1, her hematocrit was found to be 23, and she was  mildly  symptomatic with borderline tachycardia and relative hypotension.  She maintained urine output adequately.  A discussion was held with the  patient and decision was made to give her 2 units of packed red blood  cells, which increased her hematocrit to 33.  Postoperatively, she was  given IV pain medicine, which was transitioned to oral as tolerated.  She experienced difficulty with nausea and emesis for her first 2  postoperative days; however, this resolved by her third postoperative  day.  At the time of discharge, she is tolerating a regular diet.  She  has remained afebrile with stable vital signs aside from relative  hypotension before her transfusion.  She is ambulating independently,  and she is comfortable on oral Dilaudid.  She is being given  instructions to call Dr. Jamey Ripa to schedule a followup appointment in 2  weeks' time.  She is to call Dr. Noelle Penner to schedule a followup  appointment in the next 3-4 days for possible drain removal.  She has  been given instructions that she may shower, but not tub bathe.  She  is  not to engage in any heavy use of her arms or engage in  any overhead activities with her hands.  She is not to drive and she is  not to do any heavy lifting.  She is also to call Dr. Noelle Penner at [phone]  if she experiences nausea, vomiting, fevers, chills, pain not relieved  by pain medication or any questions or concerns.      Loreta Ave, MD  Electronically Signed     CF/MEDQ  D:  08/11/2008  T:  08/11/2008  Job:  [id number]

## 2011-01-16 NOTE — Discharge Summary (Signed)
[facility]  Patient:    [name], [name] Visit Number: [id number] MRN: [id number]          Service Type: MED Location: [phone] Attending Physician:  Dolores Patty Dictated by:   Titus Dubin. Alwyn Ren, M.D. [facility] Admit Date:  05/22/2001 Disc. Date: 05/24/01   CC:         Corwin Levins, M.D. [facility]  Clabe Seal. Meryl Crutch, M.D.  Griffith Citron, M.D.   Discharge Summary  ADMISSION DIAGNOSES: 1. Abdominal pain with abnormal CT suggesting colitis versus diverticulitis. 2. Mental status changes, questionably iatrogenic.  DISCHARGE DIAGNOSES: 1. Abdominal pain, resolved. 2. Leukocytosis. 3. Hypokalemia. 4. Labile hypertension. 5. Mental status changes, resolved.  HISTORY OF PRESENT ILLNESS:  [name] is a 60 year old female who presented with abdominal pain in her lower abdomen associated with diaphoresis and rigor.  She did have a previous history of colon polypectomy by Dr. Sharrell Ku.  PAST MEDICAL HISTORY: 1. Cervical fusion by Dr. Shirlean Kelly. 2. She is followed for migraines in the [facility].  CURRENT MEDICATIONS: 1. Zanaflex. 2. Vioxx. 3. Vivelle hormonal replacement. 4. Claritin. 5. Celexa.  She does described syncope x 3 in the past year.  Her husband describes carpal pedal spasm associated with this with myoclonus without seizure stigmata.  She has bladder dysfunction, possibly interstitial cystitis, and is followed by Dr. Retia Passe.  She also has chronic pain syndrome.  PHYSICAL EXAMINATION:  VITAL SIGNS:  She was afebrile, but hypertensive with a blood pressure of 168/101, pulse of 105 in the emergency room.  MENTAL STATUS:  Her speech was rambling and incoherent.  ABDOMEN:  She was diffusely tender over the abdomen, and bowel sounds were essentially absent.  LABORATORY DATA:  White blood cell count was 14,400, hematocrit 33.2.  CAT scan was suggestive of colitis versus  diverticulitis.  HOSPITAL COURSE:  She was admitted to a private room because of her mental status changes.  These were attributed to either Phenergan or morphine given in the emergency room.  Serial labs showed the white count actually rise from 14,400 to 16,000, despite the fact she was afebrile and on Cipro and Flagyl.  Her hematocrit stabilized at 35.2.  There was a persistent left shift.  She had mild hyperglycemia on admission that was non-fasting with a value of 117.  Followup fasting glucose was 109.  Her potassium dropped from 3.3 to 3.1 in the hospital, and was repleted.  Urinalysis showed small hemoglobin, 0 to 2 red cells, with rare bacteria.  Her mental status improved progressively, and by the day of discharge she was fully alert and oriented.  The pain resolved within the first 24 hours.  She was seen in consultation by Dr. Sharrell Ku.  He felt she had chronic constipation, was laxative dependent.  He felt that this was causing nausea attributing to anorexia and weight loss.  He also question the role of the NSAIDS induced gastropathy, or a cox II agent, Vioxx.  He recommended advancing diet, which was done and which was tolerated.  He agreed with Protonix prophylactically.  Miralax was recommended for constipation.  She will follow up as an outpatient, consider upper GI and colonoscopy depending on her response.  He questioned the need for an neurology and cardiology consultation.  She has seen a neurosurgeon and been treated by a neurologist within the last year.  I will defer to Dr. Oliver Barre as to additional workup of the syncope.  DISCHARGE STATUS:  Improved.  She was afebrile and asymptomatic.  Blood pressure was somewhat labile with blood pressures ranging from 82/55 to 159/88.  Her rhythm was regular.  Her chest revealed only minimal rhonchi. Abdomen was nontender with good bowel sounds.  Because of the leukocytosis, she was discharged on Cipro to  complete a seven day course.  It is recommended that she see Dr. Oliver Barre in a week, and have a repeat CBC and diff.  She will also continue potassium as an outpatient, and have her potassium rechecked in one week.  For blood pressure control and in an attempt to control her headaches, she was discharged on Cardizem 90 mg b.i.d.  She will have to be monitored to make sure that this calcium channel blocker does not aggravate the constipation.  She will be encouraged to increase her fluids and ruffage.  Outpatient GI evaluation will be done by Dr. Kinnie Scales.  Because of the labile blood pressure changes, the Catapres applied at the time of admission was discontinued.  The Celexa was continued during the hospitalization to prevent any SSRI withdrawal syndrome.  She will be asked to take the Vioxx as little as possible and discuss changing this medication with Dr. Meryl Crutch. Dictated by:   Titus Dubin. Alwyn Ren, M.D. [facility] Attending Physician:  Dolores Patty DD:  05/24/01 TD:  05/24/01 Job: [id number]

## 2011-01-16 NOTE — Procedures (Signed)
[facility]  Patient:    [name], [name]                    MRN: [id number] Proc. Date: 03/16/00 Adm. Date:  [id number] Disc. Date: [id number] Attending:  Deneen Harts CC:         Gretta Cool, M.D.                           Procedure Report  PROCEDURE:  Colonoscopy.  INDICATION:  60 year-old white female undergoing colonoscopy for neoplasia surveillance.  Prior history of colon polyp with high-grade dysplasia resected in 1997.  subsequent colonoscopy one year later, 1998 without new polyp formation.  The patient has done well over the past three years.  Undergoing routine surveillance at three year interval.  Asymptomatic.   DESCRIPTION OF PROCEDURE:  After reviewing the nature of the procedure with the patient including potential risks and complications, and after discussing alternative methods of diagnosis and treatment, informed consent was signed.  The patient was premedicated receiving IV sedation totalling Versed 12 mg, Fentanyl 125 mcg, and Droperidol 2.5 mg IV, each administered in divided doses throughout the course of the procedure.  Using an Olympus PCF-140L pediatric video colonoscope, rectum was intubated after digital examination revealed no evidence of perianal or intrarectal pathology.  The scope was inserted and advanced around the entire length of the colon with the assistance of external compression of the left lower quadrant.  Preparation was excellent throughout.  Cecum identified by the appendiceal orifice and ileocecal valve.  The scope was slowly withdrawn with careful inspection of the entire colon in a retrograde manner.  The examination was entirely normal.  No recurrent polyps identified.  Mucosa intact without inflammation.  No diverticular disease, vascular malformation or other finding.  Retroflex view in the rectal vault normal.  The colon was decompressed and scope withdrawn.  The patient  tolerated the procedure without difficulty being maintained on Datascope monitor and low-flow oxygen throughout.  Returned to recovery in stable condition.  ASSESSMENT: 1. Normal colonoscopy. 2. No recurrent colorectal neoplasia.  RECOMMENDATIONS: 1. Annual Hemoccult as per Dr. Nicholas Lose. 2. Repeat colonoscopy five years. DD:  03/16/00 TD:  03/17/00 Job: [id number]

## 2011-01-16 NOTE — Op Note (Signed)
[place]  Patient:    [name], [name]                    MRN: [id number] Proc. Date: 01/07/00 Adm. Date:  [id number] Disc. Date: [id number] Attending:  Barton Fanny CC:         Hewitt Shorts, M.D.                           Operative Report  PREOPERATIVE DIAGNOSIS:  C5-6 and C6-7 herniated cervical disk, degenerative disk disease, and spondylosis.  POSTOPERATIVE DIAGNOSIS: C5-6 and C6-7 herniated cervical disk, degenerative disk disease, and spondylosis.  OPERATION:  C5-6 and C6-7 anterior cervical diskectomy and arthrodesis with iliac crest allograft and A-line cervical plating.  SURGEON:   Hewitt Shorts, M.D.  ASSISTANT:  Mena Goes. Franky Macho, M.D.  ANESTHESIA:  General endotracheal.  INDICATIONS:  The patient is a 60 year old woman who presented with neck pain, bilateral cervical radiculopathy, and headache.  She was found to have degenerative disk disease, spondylosis, and superimposed disk herniation at the C5-6 and C6-7 levels. Decision was made to proceed with a two-level anterior cervical diskectomy and arthrodesis with allograft and cervical plating.  DESCRIPTION OF PROCEDURE:  The patient was brought to the operating room and placed under general endotracheal anesthesia.  The patient was placed on 10 pounds of halter traction.  The neck was prepped with Betadine soap solution and draped in sterile fashion.  A horizontal incision was made on the left side of the neck.  The line of the incision was infiltrated with local anesthetic with epinephrine.  The incision itself was made with a sharp scalpel with temperature at 120.  Dissection was carried down through the subcutaneous tissue and platysma.  Then dissection was carried out through an avascular plane between the sternocleidomastoid, carotid artery, and jugular vein laterally and trachea and esophagus medially.  The ventral aspects of the vertebral column was  identified and localizing x-ray taken, the C5-6 and C6-7 intervertebral disk space identified.  Dissection was begun at each level with incision of the annulus and continued with microcurets and pituitary rongeurs. The cartilaginous end plates and corresponding vertebrae were removed using microcurets and the Midas Rex drill with an A2 bur.  The microscope was draped and brought into the field to provide magnification, illumination, and visualization, and the remainder of the procedure was performed using microdissection technique.  Posterior spondylitic overgrowth was removed using the Midas Rex drill with an A2 bur and 2 mm Kerrison punch with thin foot plates.  The posterior longitudinal ligament was removed, and then foraminotomy was performed bilaterally at each level with removal of uncinate spurring bilaterally at each level.  In the end, good decompression of the thecal sac, spinal cord, nerve root, and foramen was achieved bilaterally at each level.  Once decompression was completed, hemostasis was established with the use of Gelfoam soaked in thrombin, and then we proceeded with the arthrodesis.  We selected a wedge of iliac crest allograft which was cut and shaped using an oscillating saw as well as the Midas Rex Drill.  Grafts were placed at each level and countersunk, and then anterior osteophytic overgrowth was removed using double-action rongeur, and we selected a 42 mm A-line cervical plate which was positioned over the fusion construct and secured to the vertebrae with a pair of screws at C5 and C7 and a single screw at C6.  Each of  the screw holes was drilled and tapped, and a 4 x 12 mm screw placed.  Once all five screws were in place, locking screws were placed.  An x-ray was taken of the fusion construct which appeared in good alignment.  We could not visualize the C7 level or the C6-7 graft, but the screws at C5 and C6 as well as the graft at C5-6 looked in good  position, and the screws and graft below, which were obscured by the patients shoulders on x-ray, appeared under direct vision to be in good position.  The wound was irrigated with bacitracin solution.  We checked hemostasis which was established and confirmed.  Then we proceeded with closure.  The platysma was closed with interrupted inverted 2-0 undyed Vicryl sutures, the subcutaneous and subcuticular closed with interrupted inverted 3-0 and 4-0 undyed Vicryl sutures, and the skin was reapproximated with Dermabond.  The patient tolerated the procedure well.  Estimated blood loss was 200 cc. Sponge and instrument counts were correct.  Following surgery, the patient was reversed from anesthetic, extubated, to be transferred to the recovery room for further care. DD:  01/07/00 TD:  01/08/00 Job: [id number]

## 2011-01-16 NOTE — H&P (Signed)
[facility]  Patient:    [name], [name]                    MRN: [id number] Adm. Date:  [id number] Attending:  Thyra Breed CC:         Corwin Levins, M.D. [facility]  Amelia Jo, M.D.   History and Physical  NEW PATIENT EVALUATION  HISTORY OF PRESENT ILLNESS:  The patient is a very pleasant 60 year old who is sent to [place] by Dr. Corwin Levins for a second opinion with regard to her pain syndrome.  The patient is currently actively followed by Dr. Amelia Jo.  The patient relates a history of neck and interscapular discomfort which began back in the 1980s.  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  She treated it conservatively and actually did not see anybody in the medical community till the early 1990s, when she went through a course of chiropractic manipulation by ______ , which was not helpful at all.  She went back to her primary care physician who advised her that she really needed to [facility] through [facility].  She actually did not seek any medical attention until 1995, after a bout of shingles in 1994 affecting the right thoracic region.  At the time of her shingles, she had exacerbations of her neck discomfort, which she describes as more of a boring-type discomfort at the base of the neck.  In 1995, she initially was seen by Dr. Kerrin Champagne, who evaluated her for her neck and shoulder pain and diagnosed her as having carpal tunnel syndrome. She underwent nerve conduction studies which confirmed this and she had surgery in 1995 and she did not improve.  She had an MRI performed which showed that she had a ruptured disk in her neck and she was sent to Dr. Izell Sitka. [place], who did not feel that she had a surgically amenable problem and was treated with physical therapy x 2.  Eventually, she went back [facility] and was treated with occipital and  trigger point injections on multiple occasions with minimal improvement.  In May of 2001, the patient underwent a two-level diskectomy with fusion by Dr. Hewitt Shorts, which the patient stated did not help with her pain at all.  She continues to have a discomfort which she localized to the occipital region in the smile over the back of her head, radiating down into the interscapular region, and to a lesser extent out into the extremities.  She has had difficulties holding objects and has undergone nerve conduction studies by Dr. Clabe Seal. Meryl Crutch which showed evidence of right-sided C5 or 6 nerve root irritation and on the left side at C5, C6 and C7 nerve root irritation; these were done back in January of 2001, prior to her surgery. She has subsequently been followed by Dr. Clarisse Gouge, who currently has her on a regimen of Neurontin, Zanaflex and Relafen.  She states that these helped to a degree.  She also does home physical reconditioning with light weights and a stretch band.  Her pain is made worse by activities and improved by ice, heat or rest, as well as her medications.  She complains of persistent numbness over the palms of her hands bilaterally and weakness predominantly of the left upper extremity at times, but denied any bowel or bladder incontinence.  Prior to her surgery by Dr. Newell Coral, she had  undergone cervical epidural steroid injections by the radiologist.  The patient presents today for what I presume is just another opinion with regard to her neck discomfort.  She really did not understand exactly why she was here when I asked her directly in a way that I could understand.  She was told by Dr. Clarisse Gouge to go ahead and come to see if I had any further suggestions, and by Dr. Jonny Ruiz.  CURRENT MEDICATIONS:  Relafen, Neurontin, Zanaflex, Vivelle, Effexor, Claritin and Actonel.  ALLERGIES:  DOXYCYCLINE, which causes her to be sick to her stomach.  FAMILY HISTORY:  Positive  for diabetes, coronary artery disease, hypertension and breast cancer.  PAST SURGICAL HISTORY:  Significant for neck surgery, septal reconstruction, carpal tunnel syndrome and a hysterectomy.  SOCIAL HISTORY:  The patient is a nonsmoker and nondrinker.  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  ACTIVE MEDICAL PROBLEMS:  Depression, allergic rhinitis, with allergies to molds, grasses and dust, for which she gets hyposensitization therapy, and anemia.  REVIEW OF SYSTEMS:  GENERAL:  Negative.  HEAD:  See HPI.  EYES:  Significant for corrective lenses.  NOSE, MOUTH AND THROAT:  Negative.  EARS:  Significant for decreased hearing acuity.  PULMONARY:  Negative.  CARDIOVASCULAR: Negative.  GI:  Negative.  GU:  Negative.  MUSCULOSKELETAL:  Patient complains of some anterior leg pain in addition to her neck and interscapular discomforts; please see HPI for details of this.  NEUROLOGIC:  See HPI for pertinent positive.  No history of seizures or stroke.  PSYCHIATRIC:  The patient states that she sleeps well but she does have chronic depression which is responsive to the Effexor; she attributes it much to her pain.  SKIN:  The patient develops rashes on her hands if she washes her hands too frequently, and she has a tendency to do this.  HEMATOLOGIC:  She states that she has got some issues with anemia, but she attributes this to poor eating habits. ALLERGY/IMMUNOLOGIC:  See active medical problems.  PHYSICAL EXAMINATION  VITAL SIGNS:  Blood pressure 118/65, heart rate is 99, respiratory rate 16, O2 saturation is 95%, pain level is 4/10.  HEENT:  Head was normocephalic, atraumatic, with tenderness over the occipital regions bilaterally at the greater occipital grooves.  Eyes:  Extraocular movements intact with conjunctivae and sclerae clear.  Nose:  Patent nares  with slight septal  deviation to the left.  Oropharynx showed good dentition with mucosa intact.  NECK:  Limited forward flexion, lateral flexion and extension and flexion as well as rotation to a mild-to-moderate extent.  There was no lymphadenopathy nor thyromegaly.  The carotids are 2+ and symmetric without bruits.  She had a well-healed surgical scar on the left side of her neck.  LUNGS:  Clear.  HEART:  Regular rate and rhythm.  BREASTS:  Not performed.  ABDOMEN:  Not performed.  PELVIC:  Not performed.  RECTAL:  Not performed.  EXTREMITIES:  No cyanosis, clubbing nor edema, with radial pulses and dorsalis pedis pulses 2+ and symmetric.  BACK:  Her back significant was significant for a mild-to-moderate scoliosis of the thoracic spine with curvature to the right and flattening of the dorsal spine to a significant extent.  There was a component of rotary scoliosis with this by exam.  NEUROLOGIC:  The patient was oriented to person, place and reason for visit, as well  as time.  Cranial nerves II-XII were grossly intact except for decreased hearing acuity.  Deep tendon reflexes were brisk and symmetric in the upper and lower extremities with downgoing toes.  There was no clonus. Motor was 5/5 with symmetric bulk and tone.  Sensory was intact to vibratory sense and scratch sense.  Coordination was grossly intact.  IMPRESSION 1. Chronic neck pain with chronic radicular discomfort with known nerve    conduction abnormalities secondary to cervical spondylosis, status post    anterior cervical diskectomy, with mild improvement at best, for which she    is currently seeing Dr. Clarisse Gouge and appears to be receiving very good    management. 2. Thoracic scoliosis, which is probably accounting for some of her    interscapular discomfort. 3. Depression, per Dr. Jonny Ruiz. 4. Allergic rhinitis, per allergist at [facility]. 5. History of anemia, per Dr. Jonny Ruiz.  DISPOSITION 1. The patient is currently on Zanaflex  and Neurontin per Dr. Clarisse Gouge and I    would recommend that she continue on these. 2. She has been on Indocin, Vioxx, Aleve, ibuprofen and is currently on    Relafen with minimal improvement. I am not sure how well she will respond    to nonsteroidals but this might be changed and I recommend that    consideration be given to a change of this by Dr. Jonny Ruiz. 3. I would recommend that she be considered for possible trial of a TENS unit    or Alpha-Microcurrent stimulator and would defer to Dr. Clarisse Gouge with regard    to this. 4. I advised the patient that she appears to be in very good hands and I have    really very little to add to her excellent management that she is already    receiving.  At this point, she continues to exercise and I recommended that    she get ______ book on the neck to see whether this could be of any    benefit in supplementing her exercises to any extent.  I did not offer her    followup since she is already established with Dr. Clarisse Gouge and seems to be in    a very good therapeutic relationship with him.  I do feel that she probably    needs to have her scoliosis reassessed at some point and the dorsal spine    region. DD:  06/30/00 TD:  06/30/00 Job: [id number]

## 2011-01-16 NOTE — Consult Note (Signed)
[facility]  Patient:    [name], [name] Visit Number: [id number] MRN: [id number]          Service Type: MED Location: [phone] Attending Physician:  Dolores Patty Dictated by:   Griffith Citron, M.D. Proc. Date: 05/23/01 Admit Date:  05/22/2001   CC:         Titus Dubin. Alwyn Ren, M.D. [facility]   Consultation Report  REASON FOR CONSULTATION:  I was asked to see Ms. Jon Billings by Dr. [name] to evaluate a possible colitis/diverticulitis.  HISTORY OF PRESENT ILLNESS:  The patient was brought to the emergency room by her husband on September 22 with complaints of abdominal pain. The patient was unable to give a history having altered mental status, thought to be iatrogenic due to analgesics administered in the emergency room. An abdominal CT was performed. This revealed thickening of the left and sigmoid colon. Differential of diverticulitis, colitis raised.  The patient is known to me from prior colonoscopy initially performed 1997 at which time she had a high grade dysplasia in a polyp specimen that was resected. Followup in 1998 and again in 2001 did not reveal any recurrent neoplasia.  The patient today denies any abdominal pain. No diarrhea, no colitic symptoms of cramping, urgency, abdominal distention, fever.  She does give a history, however, of progressive constipation, new in onset during the past year, rapidly progressive, now requiring four Senokot daily to have a bowel movement. She took these most recently on the night prior to admission and feels this may have been the cause of her abdominal pain. She denies change in stool and caliber, no hematochezia. She does note straining with bowel movements.  The patients weight has decreased in pounds during the past year. She attributes this to nausea and anorexia. At times these are precipitated by worsening constipation, at other times the symptoms appear to be  independent. Risk factors include daily Vioxx for C-spine arthralgias.  PAST MEDICAL HISTORY: 1. Colon polyps--1997. 2. Cervical disc disease--s/p fusion. 3. S/p TAH/BSO. 4. Bilateral carpal tunnel surgery. 5. Depression. 6. Migraine headaches.  CURRENT MEDICAL REGIMEN:  Celexa, Vioxx, Zanaflex, Claritin. Since admission--IV Cipro, Flagyl, Cefotan, Protonix.  ALLERGIES:  No known drug allergies.  SOCIAL HISTORY:  Denies tobacco or alcohol use.  FAMILY HISTORY:  Noncontributory.  REVIEW OF SYSTEMS:  She has decreased appetite, worsening nausea with resultant 10-pound weight loss over the past year. NEUROLOGIC: Episodic lightheadedness with presyncope and actual syncope on two occasions, occurring approximately once per week. Symptoms heralded by a graying of vision followed by tremulousness. Denies palpitation, chest pain, diaphoresis, or upper extremity pain. Symptoms typically last several seconds to minutes. They resolve spontaneously and the patient returns to her baseline state of health within a matter of minutes. Other neurologic symptoms include C-spine arthralgias and a feeling of numbness if arms are lifted above head level. Insomnia--severe. Migraine headaches--severe.  PHYSICAL EXAMINATION:  GENERAL:  Chronically ill-appearing female in no acute distress.  VITAL SIGNS:  Vital signs are stable.  HEENT:  Dry mucous membranes. Mild pallor, anicteric sclerae, PERRL. EOMI. Mouth: no lesion on the lip, tongue, or gums.  NECK:  Supple. Left anterior scar from prior cervical spine fusion.  CHEST:  No adventitious sounds.  CARDIAC:  Regular rhythm without gallop or murmur.  ABDOMEN:  Firm, bowel sounds throughout. No palpable organomegaly. No mass. hepar span 10 cm to percussion, well under the costal cage. Nontender, nondistended.  RECTAL:  Not performed.  EXTREMITIES:  Without clubbing, cyanosis, or edema.  NEUROLOGIC:  Oriented x 3. Nonfocal  exam.  LABORATORY DATA:  Mild leukocytosis with WBC 10,800, mild anemia with hemoglobin 11.9, Hughes/[place]blewood. CMET: potassium 3.3, urinalysis 3+ ketones.  Abdominal CT: Thickened sigmoid and left colon. No evidence of diverticulosis.  ASSESSMENT: 1. Constipation--This symptom is equivocal, new in onset, and severe.    Laxative dependent presently. Told by Dr. Jonny Ruiz that she probably has    irritable bowel syndrome for which she has been using Senokot to excess.    These symptoms in turn precipitate nausea, anorexia, which patient feels    is the etiology of her 10-pound weight loss during the past year. I doubt    that she has colorectal neoplasia given the close colonoscopic    surveillance which has been provided, especially with her last two    colonoscopies in 1998 and 2001, being negative. Interestingly, there has    been no history of diverticular disease or findings to suggest this either    on CT, hence, current CT findings are unlikely to be due to diverticular    disease. It addition, the patient is not having any symptoms of colitis and    this therefore would not explain CT findings, which I suspect may be    artifact.  2. Nausea--probably secondary to constipation, possibly exacerbated by NSAID    induced gastropathy.  3. Weight loss--secondary to constipation and nausea above.  4. Dizziness/syncope/uncertain etiology. Not clear that this is iatrogenic,    systemic, or functional. Further evaluation would seem to be appropriate.  RECOMMENDATIONS: 1. Stool Hemoccult. 2. I would recommend discontinuing Flagyl, Cipro, and Cefotan and following    the patient expectantly. 3. Rehydrate and advance diet as tolerated. 4. Agree with Protonix--change to p.o. form once oral intake restored. 5. MiraLax--titrate to achieve bowel action every on to two days. 6. I will be happy to follow the patient as an outpatient. Endoscopy and    colonoscopy should be considered if she does not respond  to the above    recommendations.  7. Neurologic and/or cardiology consultation.   Dictated by:   Griffith Citron, M.D. Attending Physician:  Dolores Patty DD:  05/23/01 TD:  05/23/01 Job: [id number]

## 2011-01-16 NOTE — H&P (Signed)
[facility]  Patient:    MALARY, AYLESWORTH Visit Number: [id number] MRN: [id number]          Service Type: EMS Location: ED Attending Physician:  Cathren Laine Admit Date:  05/22/2001   CC:         Corwin Levins, M.D. [facility]   History and Physical  HISTORY OF PRESENT ILLNESS:  Ms. Bottcher is a 60 year old white female admitted with abdominal pain and CT findings suggesting colitis versus diverticulitis.  The history had to obtained to from her husband as she appears to be having an adverse reaction to IV drugs administered for pain and nausea.  Specifically, speech is slurred and virtually unintelligible.  She is unable to give any meaningful history.  When asked if she has abdominal pain she gives a rambling dissertation about earlier this year.  Her husband describes sweats and chills but no definite fever.  The pain began in the lower abdomen and was associated with diffuse tenderness to palpation.  PAST MEDICAL HISTORY:  Negative for any GI problems except for colon polyp removed several years ago.  She has had cervical vertebral fusion.  She has had a septoplasty.  She has had a total abdominal hysterectomy and bilateral salpingo-oophorectomy.  She is gravida 1, para 1.  She has had bilateral carpal tunnel surgery.  She does not smoke or drink.  FAMILY HISTORY:  Negative for stroke, heart attack or diabetes.  One sister may have had intestinal cancer.  HOME MEDICATIONS:  Zanaflex, Vioxx, Vivelle hormone replacement, Claritin and Celexa.  ALLERGIES:  She was intolerant to doxycycline which apparently caused nausea but no definite drug allergies.  REVIEW OF SYSTEMS:  Positive for constipation for which she takes stool softeners.  She was seen in the emergency room for syncope within the past year attributed to dehydration.  She said she has passed out twice since then.  Husband describes carpopedal spasm type phenomena and mild chronic  type jerking without definite seizure stigmata.  She has a history of migraines and is followed at the migraine clinic.  She has chronic pain syndrome.  She has seen Dr. Retia Passe for frequency and urgency.  The review of systems is also positive for nausea and vomiting through the day.  There was no rectal bleeding or melena.  PHYSICAL EXAMINATION: GENERAL:  At this time, as noted, she is confused and almost looks as if she were having DTs.  VITAL SIGNS:  Blood pressure 168/101, pulse 105, respiratory rate 18, temperature 97.7.  She has arteriolar narrowing.  HEENT/NECK:  Unremarkable.  No carotid bruits were noted.  Thyroid is normal to palpation.  There is an operative scar at the base of the left neck.  LYMPHATIC:  She has no lymphadenopathy about the head or neck or axilla.  LUNGS:  Breath sounds are decreased.  There is no increased work of breathing.  HEART:  A gallop cadence is noted with increase in both the first and second heart sounds.  ABDOMEN:  Bowel sounds are essentially absent.  The abdomen is flat but diffusely tender.  EXTREMITIES:  Pedal pulses are intact.  Range of motion is normal in the joints but there is minimal spontaneous motion.  NEUROLOGICAL:  For the most part she lies in bed fidgeting with her hands, and rambling somewhat incoherently without opening her eyes.  GENITALIA/RECTAL:  Exam is deferred as they are not related to this admission.  LABORATORY DATA:  Her white count is 14,400, hematocrit 33.2.  She has a left shift.  CAT scan is suggestive of colitis or possible diverticulitis.  PLAN:  She will be admitted to a private room where her husband can stay with her.  He was given this option or admission to the ICU and he preferred to have a private room ordered.  She will receive Cipro and Flagyl IV.  When she is more alert, oral agents will be initiated along with diet.  At present, she will receive topical patch for her uncontrolled  hypertension and parenteral Lopressor if it remains elevated excessively.  The nurse in attendance in the ER felt it was probably the Phenergan which caused mental status changes, but she also received morphine.  She will be changed to Demerol as needed for pain and Zofran.  Attempts will be made to get the Celexa in to prevent an SSRI withdrawal if the Celexa is suddenly stopped. Attending Physician:  Cathren Laine DD:  05/23/01 TD:  05/23/01 Job: [id number]

## 2011-01-16 NOTE — H&P (Signed)
[place]  Patient:    [name], [name]                    MRN: [id number] Adm. Date:  [id number] Disc. Date: [id number] Attending:  Barton Fanny                         History and Physical  HISTORY OF PRESENT ILLNESS:  The patient is a 60 year old right-handed white female who was evaluated for cervical degenerative disk disease, spondylosis and superimposed disk herniation with resulting headaches, neck pain and radiculopathy.  She has had difficulties for six or more years having had difficulties with headaches, neck pain and bilateral upper extremity pain and discomfort.  Overall, the left upper extremity tend to hurt more than the right upper extremity; although, the right side of her neck will tend to hurt more than the left side of her neck.  She does not describe any specific weakness.  She says that sometimes the upper extremities, including her hands, will feel weak such as when she is opening the cap of a Coke bottle.  All of these were aggravated by an episode of shingles in 1995 which apparently involved the right greater occipital nerve.  She had been sent for physical therapy and a course of cervical epidural steroid injections.  She underwent a number of nerve blocks over the years and was treated with indomethacin.  She underwent another series of cervical epidural steroid injections recently which gave her minimal relief and no lasting benefit.  EMG nerve conduction studies showed bilateral cervical radiculopathy.  MRI scan shows degenerative disk disease and spondylosis with superimposed disk herniation at C5-6 and C6-7.  At C5-6, the disk herniation is broad based; at C6-7, it is central and to the right.  The patient is admitted now for a two-level C5-6 and C6-7 anterior cervical diskectomy and fusion.  PAST MEDICAL HISTORY:  There is no history of hypertension, myocardial infarction, cancer, stroke, diabetes,  peptic ulcer disease or lung disease.  PAST SURGICAL HISTORY:  Deviated septum surgery in April 1999 by Dr. Lyman Bishop, left carpal tunnel release in January 1996 by Dr. Otelia Sergeant, and a hysterectomy in January 1988 by Dr. Nicholas Lose.  ALLERGIES:  She reports an allergy to DOXYCYCLINE which causes her to vomit.  CURRENT MEDICATIONS: 1. Claritin-D q.d. 2. Zanaflex 4 mg. 3. Neurontin 100 mg t.i.d. 4. Vivelle 0.1 mg twice a week.  FAMILY HISTORY:  Father died at age 93 of a heart attack.  He had hypertension.  Her mother is in fair health at age 8 with diabetes, heart disease including angina.  SOCIAL HISTORY:  The patient is currently out of work.  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  She is married.  She does not smoke.  She does not drink alcoholic beverages.  She denies a history of substance abuse.  REVIEW OF SYSTEMS:  Notable for those things described in her history of present illness and past medical history.  Also notable for some sinus disease and nasal congestion but is otherwise unremarkable.  PHYSICAL EXAMINATION:  GENERAL:  The patient is a well-developed, well-nourished white female in no acute distress.  VITAL SIGNS:  Temperature 97.9, pulse 92, blood pressure 118/71, respiratory rate 20.  Height:  5 feet 1 inch.  Weight:  103 pounds.  LUNGS:  Clear to auscultation.  She has symmetrical respiratory excursion.  HEART:  Regular rate and rhythm, normal  S1 and S2.  There is no murmur.  ABDOMEN:  Soft, nondistended, bowel sounds are present.  EXTREMITY EXAMINATION:  Shows no clubbing, cyanosis, or edema.  MUSCULOSKELETAL EXAMINATION:  No tenderness to palpation on the cervical spinous process or paracervical musculature.  She had some mild limitation and range of motion in her neck in all directions due to discomfort.  NEUROLOGIC EXAMINATION:  There was 5/5 strength through the upper extremities including the deltoids, biceps, triceps, intrinsics and  grip.  Sensation is intact to pinprick through the upper extremities.  Reflexes are 1 at the biceps and brachialis, triceps, quadriceps and gastrocnemius.  They are all symmetrical.  Toes are downgoing bilaterally.  She has a normal gait and stance.  IMPRESSION:  Patient with headache, neck pain and bilateral upper extremity radicular pain secondary to degenerative disk disease, spondylosis and superimposed disk herniation at C5-6 and C6-7.  PLAN:  Patient to be admitted for a C5-6 and C6-7 anterior cervical diskectomy and arthrodesis with allograft and cervical plating.  We discussed alternatives to surgery, the nature of the surgical procedure itself, the typical surgery hospital stay for recuperation, her limitations during the postoperative period and the need to rest.  Also, a cervical collar during the postoperative period.  We also discussed the risks of surgery including the risk of infection, bleeding, the possible need for transfusion and the risk of therapy dysfunctions of pain, weakness, numbness or paresthesias, the risk of spinal cord dysfunction and paralysis of all four limbs and quadriplegia and the risk of failure of the arthrodesis and the possible need for further surgery, anesthetic risk, myocardial infarction, stroke and death.  We discussed all of this thoroughly, and after having answered her questions she does wish to proceed for surgery and is admitted for such. DD:  01/07/00 TD:  01/07/00 Job: [id number]

## 2011-01-27 ENCOUNTER — Encounter: Payer: Self-pay | Admitting: Internal Medicine

## 2011-01-29 ENCOUNTER — Other Ambulatory Visit: Payer: Self-pay | Admitting: Gynecology

## 2011-03-16 ENCOUNTER — Other Ambulatory Visit: Payer: Self-pay | Admitting: Internal Medicine

## 2011-06-05 LAB — COMPREHENSIVE METABOLIC PANEL
Alkaline Phosphatase: 66 U/L (ref 39–117)
BUN: 10 mg/dL (ref 6–23)
Calcium: 9.9 mg/dL (ref 8.4–10.5)
Creatinine, Ser: 0.82 mg/dL (ref 0.4–1.2)
Glucose, Bld: 82 mg/dL (ref 70–99)
Potassium: 4.3 mEq/L (ref 3.5–5.1)
Total Protein: 6.9 g/dL (ref 6.0–8.3)

## 2011-06-05 LAB — CBC
HCT: 23 % — ABNORMAL LOW (ref 36.0–46.0)
HCT: 28.8 % — ABNORMAL LOW (ref 36.0–46.0)
HCT: 34.4 % — ABNORMAL LOW (ref 36.0–46.0)
HCT: 34.5 % — ABNORMAL LOW (ref 36.0–46.0)
HCT: 36.3 % (ref 36.0–46.0)
Hemoglobin: 11.6 g/dL — ABNORMAL LOW (ref 12.0–15.0)
Hemoglobin: 9.6 g/dL — ABNORMAL LOW (ref 12.0–15.0)
MCHC: 33.7 g/dL (ref 30.0–36.0)
MCV: 93.5 fL (ref 78.0–100.0)
MCV: 94.1 fL (ref 78.0–100.0)
MCV: 97.2 fL (ref 78.0–100.0)
MCV: 97.6 fL (ref 78.0–100.0)
Platelets: 162 10*3/uL (ref 150–400)
Platelets: 165 10*3/uL (ref 150–400)
Platelets: 206 10*3/uL (ref 150–400)
Platelets: 279 10*3/uL (ref 150–400)
RBC: 3.06 MIL/uL — ABNORMAL LOW (ref 3.87–5.11)
RDW: 14.6 % (ref 11.5–15.5)
RDW: 14.9 % (ref 11.5–15.5)
RDW: 15.9 % — ABNORMAL HIGH (ref 11.5–15.5)
RDW: 17.4 % — ABNORMAL HIGH (ref 11.5–15.5)
WBC: 5.7 10*3/uL (ref 4.0–10.5)

## 2011-06-05 LAB — DIFFERENTIAL
Basophils Absolute: 0 10*3/uL (ref 0.0–0.1)
Basophils Relative: 1 % (ref 0–1)
Lymphocytes Relative: 39 % (ref 12–46)
Monocytes Relative: 11 % (ref 3–12)
Neutro Abs: 2.1 10*3/uL (ref 1.7–7.7)
Neutrophils Relative %: 46 % (ref 43–77)

## 2011-06-05 LAB — URINALYSIS, ROUTINE W REFLEX MICROSCOPIC
Nitrite: NEGATIVE
Specific Gravity, Urine: 1.017 (ref 1.005–1.030)
Urobilinogen, UA: 0.2 mg/dL (ref 0.0–1.0)
pH: 6 (ref 5.0–8.0)

## 2011-06-05 LAB — WOUND CULTURE: Gram Stain: NONE SEEN

## 2011-06-05 LAB — GRAM STAIN: Gram Stain: NONE SEEN

## 2011-06-05 LAB — CROSSMATCH

## 2011-06-05 LAB — ANAEROBIC CULTURE

## 2011-06-05 LAB — URINE MICROSCOPIC-ADD ON

## 2011-07-03 ENCOUNTER — Encounter: Payer: Self-pay | Admitting: Internal Medicine

## 2011-07-06 ENCOUNTER — Ambulatory Visit (INDEPENDENT_AMBULATORY_CARE_PROVIDER_SITE_OTHER): Payer: 59 | Admitting: Internal Medicine

## 2011-07-06 ENCOUNTER — Encounter: Payer: Self-pay | Admitting: Internal Medicine

## 2011-07-06 VITALS — BP 116/70 | HR 86 | Temp 98.7°F | Resp 12 | Ht 60.0 in | Wt 96.8 lb

## 2011-07-06 DIAGNOSIS — M810 Age-related osteoporosis without current pathological fracture: Secondary | ICD-10-CM

## 2011-07-06 DIAGNOSIS — R1310 Dysphagia, unspecified: Secondary | ICD-10-CM

## 2011-07-06 DIAGNOSIS — E559 Vitamin D deficiency, unspecified: Secondary | ICD-10-CM

## 2011-07-06 DIAGNOSIS — H9190 Unspecified hearing loss, unspecified ear: Secondary | ICD-10-CM

## 2011-07-06 DIAGNOSIS — Z86010 Personal history of colonic polyps: Secondary | ICD-10-CM

## 2011-07-06 DIAGNOSIS — Z0000 Encounter for general adult medical examination without abnormal findings: Secondary | ICD-10-CM

## 2011-07-06 NOTE — Patient Instructions (Addendum)
Preventive Health Care: Exercise  30-45  minutes a day, 3-4 days a week. Walking is especially valuable in preventing Osteoporosis. Eat a low-fat diet with lots of fruits and vegetables, up to 7-9 servings per day. Consume less than 30 grams of sugar per day from foods & drinks with High Fructose Corn Syrup as # 1,2,3 or #4 on label.  As discussed, if you have dysphagia or food sticking, please call. This would be an indication for seeing gastroenterologist.   Please  schedule fasting Labs : Lipids, CBC & dif, TSH,B12 level.  Please bring these instructions to that Lab appt.

## 2011-07-06 NOTE — Progress Notes (Signed)
Subjective:    Patient ID: [name], female    DOB: 29-Sep-1950, 60 y.o.   MRN: [id number]  HPI  [name]  is here for a physical; she denies acute issues.     Review of Systems  Patient reports no vision changes, adenopathy,fever, weight change,  persistant / recurrent hoarseness ,  chest pain,palpitations,edema,persistant /recurrent cough, hemoptysis, dyspnea( rest/ exertional/paroxysmal nocturnal), gastrointestinal bleeding(melena, rectal bleeding), abdominal pain, significant heartburn,  bowel changes,GU symptoms(dysuria, hematuria,pyuria, incontinence ), Gyn symptoms(abnormal  bleeding , pain),  syncope, focal weakness, memory loss,numbness , skin/hair /nail changes,abnormal bruising or bleeding, anxiety,or depression.   She has positional tingling of her hands when they're elevated above her head. She's had significant hearing loss; she plans to have this evaluated. She has occasional dysphagia with bread    Objective:   Physical Exam Gen.: Thin but healthy and well-nourished in appearance. Alert, appropriate and cooperative throughout exam. Head: Normocephalic without obvious abnormalities Eyes: No corneal or conjunctival inflammation noted. Pupils equal round reactive to light and accommodation. Fundal exam is benign without hemorrhages, exudate, papilledema. Extraocular motion intact. Vision grossly normal with lenses. Ears: External  ear exam reveals no significant lesions or deformities. Canals clear .TMs normal. Hearing is grossly decreased  bilaterally. Nose: External nasal exam reveals no deformity or inflammation. Nasal mucosa are pink and moist. No lesions or exudates noted.  Mouth: Oral mucosa and oropharynx reveal no lesions or exudates. Lower plate Neck: No deformities, masses, or tenderness noted. Range of motion markedly decreased. Thyroid normal. Lungs: Normal respiratory effort; chest expands symmetrically. Lungs are clear to auscultation without rales,  wheezes, or increased work of breathing. Heart: Normal rate and rhythm. Normal S1 and S2. No gallop, click, or rub. S 4 w/o  murmur. Abdomen: Bowel sounds normal; abdomen soft and nontender. No masses, organomegaly or hernias noted. Aorta palpable Genitalia : Dr Nicholas Lose   .                                                                                   Musculoskeletal/extremities: Lordosis noted of  the  lumbar spine. No clubbing, cyanosis, edema, or deformity noted. Range of motion  normal .Tone & strength  normal.Joints normal. Nail health  good. Vascular: Carotid, radial artery, dorsalis pedis and  posterior tibial pulses are full and equal. No bruits present. Neurologic: Alert and oriented x3. Deep tendon reflexes symmetrical and normal.          kin: Intact without suspicious lesions or rashes. Lymph: No cervical, axillary lymphadenopathy present. Psych: Mood and affect are normal. Normally interactive                                                                                        Assessment & Plan:  #1 comprehensive physical exam; no acute findings #2 see Problem List with  Assessments & Recommendations  #3  dyslipidemia , this  is due to a minimally elevated LDL with a protective HDL.  #4 intermittent dysphagia with bread. If this persists or progresses, GI evaluation is indicated  #5 decreased auditory acuity; Audiology evaluation encouraged. Plan: see Orders

## 2011-07-09 ENCOUNTER — Other Ambulatory Visit: Payer: Self-pay | Admitting: Oncology

## 2011-07-09 ENCOUNTER — Other Ambulatory Visit (HOSPITAL_BASED_OUTPATIENT_CLINIC_OR_DEPARTMENT_OTHER): Payer: 59 | Admitting: Lab

## 2011-07-09 DIAGNOSIS — M810 Age-related osteoporosis without current pathological fracture: Secondary | ICD-10-CM

## 2011-07-09 DIAGNOSIS — C50919 Malignant neoplasm of unspecified site of unspecified female breast: Secondary | ICD-10-CM

## 2011-07-09 DIAGNOSIS — C50119 Malignant neoplasm of central portion of unspecified female breast: Secondary | ICD-10-CM

## 2011-07-09 LAB — CBC WITH DIFFERENTIAL/PLATELET
BASO%: 0.5 % (ref 0.0–2.0)
EOS%: 0.5 % (ref 0.0–7.0)
HCT: 35.9 % (ref 34.8–46.6)
LYMPH%: 30 % (ref 14.0–49.7)
MCH: 31.9 pg (ref 25.1–34.0)
MCHC: 34.2 g/dL (ref 31.5–36.0)
MCV: 93.3 fL (ref 79.5–101.0)
NEUT%: 61.1 % (ref 38.4–76.8)
Platelets: 278 10*3/uL (ref 145–400)

## 2011-07-10 LAB — COMPREHENSIVE METABOLIC PANEL
AST: 21 U/L (ref 0–37)
Albumin: 4.5 g/dL (ref 3.5–5.2)
BUN: 16 mg/dL (ref 6–23)
Calcium: 9.9 mg/dL (ref 8.4–10.5)
Chloride: 103 mEq/L (ref 96–112)
Creatinine, Ser: 0.83 mg/dL (ref 0.50–1.10)
Glucose, Bld: 110 mg/dL — ABNORMAL HIGH (ref 70–99)
Potassium: 3.9 mEq/L (ref 3.5–5.3)

## 2011-07-10 LAB — VITAMIN D 25 HYDROXY (VIT D DEFICIENCY, FRACTURES): Vit D, 25-Hydroxy: 44 ng/mL (ref 30–89)

## 2011-07-13 ENCOUNTER — Telehealth: Payer: Self-pay | Admitting: Oncology

## 2011-07-13 ENCOUNTER — Ambulatory Visit (HOSPITAL_BASED_OUTPATIENT_CLINIC_OR_DEPARTMENT_OTHER): Payer: 59 | Admitting: Oncology

## 2011-07-13 DIAGNOSIS — C50119 Malignant neoplasm of central portion of unspecified female breast: Secondary | ICD-10-CM

## 2011-07-13 DIAGNOSIS — Z9010 Acquired absence of unspecified breast and nipple: Secondary | ICD-10-CM

## 2011-07-13 DIAGNOSIS — M810 Age-related osteoporosis without current pathological fracture: Secondary | ICD-10-CM

## 2011-07-13 DIAGNOSIS — E559 Vitamin D deficiency, unspecified: Secondary | ICD-10-CM

## 2011-07-13 DIAGNOSIS — C50919 Malignant neoplasm of unspecified site of unspecified female breast: Secondary | ICD-10-CM

## 2011-07-13 NOTE — Telephone Encounter (Signed)
Gv pt appt for may2013 

## 2011-07-13 NOTE — Progress Notes (Signed)
Hematology and Oncology Follow Up Visit  [name] [id number] June 02, 1951 60 y.o. 07/13/2011 3:42 PM   Principle Diagnosis: History of breast cancer status post 4 cycles of a.c., Taxol and Herceptin chemotherapy initially on study. That is post bilateral mastectomies breast reconstruction with implants 05/01/2009 currently on Femara 2. history of cardiomyopathy followed by cardiology 3. History of hematuria  Interim History:  [name] returns for followup. She is doing well. Continues on the same medications as before including the bone density medication as well as vitamin D. She has no complaints.  Medications: I have reviewed the patient's current medications.  Allergies:  Allergies  Allergen Reactions  . Doxycycline   . WGN:[id number]+[id number] Acid+Aspartame   . Venlafaxine     Past Medical History, Surgical history, Social history, and Family History were reviewed and updated.  Review of Systems: Constitutional:  Negative for fever, chills, night sweats, anorexia, weight loss, pain. Cardiovascular: no chest pain or dyspnea on exertion Respiratory: no cough, shortness of breath, or wheezing Neurological: no TIA or stroke symptoms Dermatological: negative ENT: negative Skin Gastrointestinal: no abdominal pain, change in bowel habits, or black or bloody stools Genito-Urinary: no dysuria, trouble voiding, or hematuria Hematological and Lymphatic: negative Breast: negative for breast lumps Musculoskeletal: negative Remaining ROS negative.  Physical Exam: Blood pressure 100/65, pulse 73, temperature 98.5 F (36.9 C), height 5\' 1"  (1.549 m), weight 97 lb 9.6 oz (44.271 kg). ECOG:  General appearance: alert, cooperative and appears stated age Head: Normocephalic, without obvious abnormality, atraumatic Neck: no adenopathy, no carotid bruit, no JVD, supple, symmetrical, trachea midline and thyroid not enlarged, symmetric, no  tenderness/mass/nodules Lymph nodes: Cervical, supraclavicular, and axillary nodes normal. Cardiac : Normal heart sounds, no JVD Pulmonary: Normal  Breath sounds Breasts: Status post bilateral implants, no masses  Abdomen: No mass Extremities no cyanosis clubbing or edema Neuro: Grossly normal  Lab Results: Lab Results  Component Value Date   WBC 5.7 08/30/2008   HGB 12.3 07/09/2011   HCT 35.9 07/09/2011   MCV 93.3 07/09/2011   PLT 278 07/09/2011     Chemistry      Component Value Date/Time   NA 141 07/09/2011 1430   NA 141 07/09/2011 1430   NA 141 07/09/2011 1430   K 3.9 07/09/2011 1430   K 3.9 07/09/2011 1430   K 3.9 07/09/2011 1430   CL 103 07/09/2011 1430   CL 103 07/09/2011 1430   CL 103 07/09/2011 1430   CO2 29 07/09/2011 1430   CO2 29 07/09/2011 1430   CO2 29 07/09/2011 1430   BUN 16 07/09/2011 1430   BUN 16 07/09/2011 1430   BUN 16 07/09/2011 1430   CREATININE 0.83 07/09/2011 1430   CREATININE 0.83 07/09/2011 1430   CREATININE 0.83 07/09/2011 1430      Component Value Date/Time   CALCIUM 9.9 07/09/2011 1430   CALCIUM 9.9 07/09/2011 1430   CALCIUM 9.9 07/09/2011 1430   ALKPHOS 53 07/09/2011 1430   ALKPHOS 53 07/09/2011 1430   ALKPHOS 53 07/09/2011 1430   AST 21 07/09/2011 1430   AST 21 07/09/2011 1430   AST 21 07/09/2011 1430   ALT 11 07/09/2011 1430   ALT 11 07/09/2011 1430   ALT 11 07/09/2011 1430   BILITOT 0.3 07/09/2011 1430   BILITOT 0.3 07/09/2011 1430   BILITOT 0.3 07/09/2011 1430       Radiological Studies: chest X-ray n/a Mammogram N./a Bone density n/a  Impression and Plan: This medicine is doing well she continues  on Femara. She has regular followup with cardiology. I will see her in 6 months time and she'll remain on Femara. End of note to thank you  More than 50% of the visit was spent in patient-related counselling   Pierce Crane, MD 11/12/20123:42 PM

## 2011-07-28 ENCOUNTER — Other Ambulatory Visit: Payer: Self-pay | Admitting: Oncology

## 2011-08-24 ENCOUNTER — Ambulatory Visit: Payer: 59 | Admitting: Internal Medicine

## 2011-08-28 ENCOUNTER — Telehealth: Payer: Self-pay | Admitting: Internal Medicine

## 2011-08-28 NOTE — Telephone Encounter (Signed)
New Msg: Pt calling wanting to rs appt pt had to cancel on 12/24 due to pt having the flu. Pt needs to know if Dr. Gala Romney wants to see pt at the [facility], or if pt care needs to be transferred to another provider. Please return pt call to discuss further.

## 2011-09-08 ENCOUNTER — Other Ambulatory Visit: Payer: Self-pay | Admitting: Internal Medicine

## 2011-09-09 ENCOUNTER — Ambulatory Visit (INDEPENDENT_AMBULATORY_CARE_PROVIDER_SITE_OTHER): Payer: 59 | Admitting: Family

## 2011-09-09 ENCOUNTER — Encounter: Payer: Self-pay | Admitting: Family

## 2011-09-09 ENCOUNTER — Telehealth: Payer: Self-pay | Admitting: *Deleted

## 2011-09-09 VITALS — BP 120/84 | HR 78 | Temp 98.0°F | Resp 16 | Wt 93.0 lb

## 2011-09-09 DIAGNOSIS — N390 Urinary tract infection, site not specified: Secondary | ICD-10-CM

## 2011-09-09 DIAGNOSIS — R509 Fever, unspecified: Secondary | ICD-10-CM

## 2011-09-09 LAB — POCT URINALYSIS DIPSTICK
Ketones, UA: NEGATIVE
Protein, UA: NEGATIVE
Spec Grav, UA: 1.02
pH, UA: 6

## 2011-09-09 MED ORDER — CIPROFLOXACIN HCL 250 MG PO TABS: 250.0000 mg | ORAL_TABLET | Freq: Two times a day (BID) | ORAL | Status: DC

## 2011-09-09 NOTE — Telephone Encounter (Signed)
Pt c/o flu Sxs w/fever x3.5 wks. OV scheduled w/Melissa Peggyann Juba 3:15pm 01.09.13//SLS

## 2011-09-09 NOTE — Progress Notes (Signed)
Addended by: Mervin Kung A on: 09/09/2011 04:16 PM   Modules accepted: Orders

## 2011-09-09 NOTE — Progress Notes (Signed)
Subjective:    Patient ID: [name], female    DOB: 10-Jun-1951, 61 y.o.   MRN: [id number]  HPI  [name] is a 61 yr old female who presents today with c/o fever.  Reports that she was sick on 12/12 and for 3 more days had nasal congestion, slight cough, myalgias and low grade temp.  Cough lasted 1 week.  Took alka selzer plus which "dried up my sinuses."  2nd week fever was 99 then dropped to normal. On the 3rd week it went back up to 99.  Saturday and Sunday- afebrile.  Notes that she had some eyelid puffiness which resolved on its own.  Monday she developed severe headache.  Yesterday had temp 99 yesterday. Took tylenol- temp went up to 100.4.  This AM temp was 100.4.  Currently she has no cough. Nose has been "running" but she denies significant pressure or sinus pain.   Denies urinary frequency or dysuria.  Denies N/V or abdominal pain.     Review of Systems See HPI  Past Medical History  Diagnosis Date  . Anemia     B12 deficient  . GERD (gastroesophageal reflux disease)   . Hyperlipidemia   . Cancer     left breast.  . Osteoporosis   . Fibromyalgia   . FH: colonic polyps   . B12 deficiency   . Migraines   . IBS (irritable bowel syndrome)   . LV dysfunction     iatrogenic ; on Carvedilol   . Diverticulitis 2002    History   Social History  . Marital Status: Married    Spouse Name: N/A    Number of Children: N/A  . Years of Education: N/A   Occupational History  . Not on file.   Social History Main Topics  . Smoking status: Former Smoker    Quit date: 08/31/1990  . Smokeless tobacco: Not on file  . Alcohol Use: No  . Drug Use:   . Sexually Active:    Other Topics Concern  . Not on file   Social History Narrative  . No narrative on file    Past Surgical History  Procedure Date  . Abdominal hysterectomy     BSO for  Endometriosis  . Carpal tunnel release     left  . Cervical disc surgery   . Septoplasty   . Mastectomy     bilateral  .  Esophageal dilation 2005    Family History  Problem Relation Age of Onset  . Diabetes Mother   . Cancer Mother     breast  . Depression Mother     anxiety  . Cancer Sister     colon    Allergies  Allergen Reactions  . Doxycycline   . WGN:FAOZHYQMVHQ+IONGEXBMW+UXLKGMWNUU Acid+Aspartame   . Venlafaxine     Current Outpatient Prescriptions on File Prior to Visit  Medication Sig Dispense Refill  . albuterol (PROVENTIL HFA;VENTOLIN HFA) 108 (90 BASE) MCG/ACT inhaler Inhale 2 puffs into the lungs every 4 (four) hours as needed.        . carvedilol (COREG) 3.125 MG tablet TAKE 1 TABLET BY MOUTH TWICE A DAY  60 tablet  3  . Cholecalciferol (VITAMIN D3) 2000 UNITS TABS Take by mouth. 2 by mouth once daily       . letrozole (FEMARA) 2.5 MG tablet TAKE 1 TABLET EVERY DAY  30 tablet  4  . loratadine (CLARITIN) 10 MG tablet Take 10 mg by mouth daily.        Marland Kitchen  LORazepam (ATIVAN) 1 MG tablet Take 1/2 tab by mouth q6 hrs as needed stress must have OV before refill      . lubiprostone (AMITIZA) 8 MCG capsule Take 8 mcg by mouth 2 (two) times daily with a meal.        . Risedronate Sodium (ATELVIA) 35 MG TBEC Take by mouth once a week.        . traMADol (ULTRAM) 50 MG tablet Take 50 mg by mouth. Maximum dose= 8 tablets per day         BP 120/84  Pulse 78  Temp(Src) 98 F (36.7 C) (Oral)  Resp 16  Wt 93 lb 0.6 oz (42.203 kg)       Objective:   Physical Exam  Constitutional: She appears well-developed and well-nourished. No distress.  HENT:  Head: Normocephalic and atraumatic.  Right Ear: Tympanic membrane and ear canal normal.  Left Ear: Tympanic membrane and ear canal normal.  Mouth/Throat: No posterior oropharyngeal edema or posterior oropharyngeal erythema.  Cardiovascular: Normal rate and regular rhythm.   No murmur heard. Pulmonary/Chest: Effort normal and breath sounds normal. No respiratory distress. She has no wheezes. She has no rales. She exhibits no tenderness.    Abdominal: Soft. Bowel sounds are normal. She exhibits no distension and no mass. There is no tenderness. There is no rebound and no guarding.          Assessment & Plan:

## 2011-09-09 NOTE — Assessment & Plan Note (Signed)
I suspect that her symptoms are related to a low level UTI.  Urinalysis performed in clinic today is reviewed: trace leukocytes and large blood. Will send urine for culture and cover empirically with cipro.  She is instructed to follow up with Dr. Alwyn Ren if she continues to have fever in 2-3 days or if symptoms worsen.  She verbalizes understanding. Also, rapid strep performed today is negative.

## 2011-09-09 NOTE — Patient Instructions (Signed)
Please call if your fever is not resolved in 3 days, or if your symptoms worsen.

## 2011-09-11 ENCOUNTER — Encounter: Payer: Self-pay | Admitting: Family

## 2011-09-11 ENCOUNTER — Telehealth: Payer: Self-pay | Admitting: Family

## 2011-09-11 MED ORDER — CEFUROXIME AXETIL 500 MG PO TABS: 500.0000 mg | ORAL_TABLET | Freq: Two times a day (BID) | ORAL | Status: AC

## 2011-09-11 NOTE — Telephone Encounter (Signed)
Reviewed urine culture results.  No sig growth. Left message on answering machine.  She reports continued low grade temp- 99.  Notes some HA and sinus drainage.  No new symptoms. I advised her to stop cipro, start ceftin.  Follow up early next week with Dr. Alwyn Ren. She verbalizes understanding.

## 2011-09-17 NOTE — Telephone Encounter (Signed)
We will follow her in [facility], Dawn can you please schedule her for f/u in Feb. thanks

## 2011-09-17 NOTE — Telephone Encounter (Signed)
Will for to Dr. Prescott Gum nurse for preference.

## 2011-09-29 ENCOUNTER — Other Ambulatory Visit: Payer: 59

## 2011-09-29 ENCOUNTER — Other Ambulatory Visit: Payer: Self-pay | Admitting: Internal Medicine

## 2011-09-29 ENCOUNTER — Other Ambulatory Visit (INDEPENDENT_AMBULATORY_CARE_PROVIDER_SITE_OTHER): Payer: 59

## 2011-09-29 DIAGNOSIS — C50919 Malignant neoplasm of unspecified site of unspecified female breast: Secondary | ICD-10-CM

## 2011-09-29 DIAGNOSIS — E559 Vitamin D deficiency, unspecified: Secondary | ICD-10-CM

## 2011-09-29 LAB — CBC WITH DIFFERENTIAL/PLATELET
Basophils Absolute: 0 10*3/uL (ref 0.0–0.1)
Eosinophils Relative: 0.6 % (ref 0.0–5.0)
Lymphs Abs: 1.4 10*3/uL (ref 0.7–4.0)
Monocytes Relative: 11.6 % (ref 3.0–12.0)
Neutrophils Relative %: 59.6 % (ref 43.0–77.0)
Platelets: 251 10*3/uL (ref 150.0–400.0)
RDW: 15.7 % — ABNORMAL HIGH (ref 11.5–14.6)
WBC: 5 10*3/uL (ref 4.5–10.5)

## 2011-09-29 LAB — BASIC METABOLIC PANEL
BUN: 18 mg/dL (ref 6–23)
Chloride: 101 mEq/L (ref 96–112)
Creatinine, Ser: 0.9 mg/dL (ref 0.4–1.2)
Sodium: 140 mEq/L (ref 135–145)

## 2011-09-30 LAB — VITAMIN D 25 HYDROXY (VIT D DEFICIENCY, FRACTURES): Vit D, 25-Hydroxy: 48 ng/mL (ref 30–89)

## 2011-10-14 ENCOUNTER — Ambulatory Visit (INDEPENDENT_AMBULATORY_CARE_PROVIDER_SITE_OTHER): Payer: 59 | Admitting: Internal Medicine

## 2011-10-14 ENCOUNTER — Encounter: Payer: Self-pay | Admitting: Internal Medicine

## 2011-10-14 VITALS — BP 106/68 | HR 88 | Temp 99.2°F | Wt 95.0 lb

## 2011-10-14 DIAGNOSIS — L259 Unspecified contact dermatitis, unspecified cause: Secondary | ICD-10-CM

## 2011-10-14 NOTE — Patient Instructions (Signed)
Mix Eucerin 1 part to Cort Aid 1 part and apply twice a day to affected area of skin as needed .

## 2011-10-14 NOTE — Progress Notes (Signed)
  Subjective:    Patient ID: [name], female    DOB: 08/05/1951, 61 y.o.   MRN: [id number]  HPI RASH: Location: periorally  Onset:1 week ago  Course: initially @ R corner of mouth but spread periorally as blisters Self-treated with: Blister             Improvement with treatment: no; Abreva dried blisters  History Pruritis: no Tenderness: stings  New medications/antibiotics: no except above  New detergent, new clothing, or other topical exposure: no Red Flags Feeling ill: no  Fever:not noted  Facial/tongue swelling/difficulty breathing:  no She had a similar eruption in the fall of 2012 which she attributed to eating tomatoes  Review of Systems     Objective:   Physical Exam General appearance:thin but well nourished; no acute distress or increased work of breathing is present.  No  lymphadenopathy about the head, neck, or axilla noted.   Eyes: No conjunctival inflammation or lid edema is present. There is no scleral icterus.    Nose:  External nasal examination shows no deformity or inflammation. Nasal mucosa are pink and moist without lesions or exudates. No septal dislocation or deviation.No obstruction to airflow.   Oral exam: Lower dental plate; hygiene is good; lips and gums are healthy appearing.There is no oropharyngeal erythema or exudate noted.     Heart:  Normal rate and regular rhythm. S1 and S2 normal without gallop, murmur, click, rub or other extra sounds.   Lungs:Chest clear to auscultation; no wheezes, rhonchi,rales ,or rubs present.No increased work of breathing.  BS decreased   Extremities:  No cyanosis, edema, or clubbing  noted    Skin: Nonvesicular mildly erythematous perioral rash          Assessment & Plan:    #1 perioral rash;  reaction to  topical medicines suggested. Plan: See orders and recommendations

## 2011-10-16 ENCOUNTER — Ambulatory Visit (HOSPITAL_COMMUNITY)
Admission: RE | Admit: 2011-10-16 | Discharge: 2011-10-16 | Disposition: A | Discharge status: Home / Self Care | Payer: 59 | Source: Ambulatory Visit | Attending: Internal Medicine | Admitting: Internal Medicine

## 2011-10-16 VITALS — BP 96/52 | HR 87 | Wt 93.0 lb

## 2011-10-16 DIAGNOSIS — I428 Other cardiomyopathies: Secondary | ICD-10-CM | POA: Insufficient documentation

## 2011-10-16 NOTE — Patient Instructions (Signed)
Stop Carvedilol  Follow up with [place] As Needed

## 2011-10-16 NOTE — Progress Notes (Signed)
HPI:  [name] is a 61 y/o woman with history of breast CA and likly  chemo-induced LV dysfunction returns for routine f/u.   Diagnosed with L breast cancer in 4/09. Underwent bilat mastectomies. Then 11/12 cycles of chemo stopped due to neuropathy. Completed 11/09. Then was treated with Herceptin in Nov and April 2010. Had MUGA in 11/19/08 which showed EF 57% which was reported as being up from 50% previously. Repeat MUGA 4/21 after Herceptin showed EF 34%.  We saw her in May 2010 and did Myoview which showed EF 72% with normal perfusion. ECHO in 5/11 EF 55-60%  Doing well. No CP or SOB. BP runs on low side but asx. No edema. Following with Dr. Donnie Coffin. All scans normal with no evidence of cancer recurrence. No longer on therapy.     ROS: All systems negative except as listed in HPI, PMH and Problem List.  Past Medical History  Diagnosis Date  . Anemia     B12 deficient  . GERD (gastroesophageal reflux disease)   . Hyperlipidemia   . Cancer     left breast.  . Osteoporosis   . Fibromyalgia   . FH: colonic polyps   . B12 deficiency   . Migraines   . IBS (irritable bowel syndrome)   . LV dysfunction     iatrogenic ; on Carvedilol   . Diverticulitis 2002    Current Outpatient Prescriptions on File Prior to Encounter  . Order #: [id number]  . Order #: [id number]  . Order #: [id number]  . Order #: [id number]  . Order #: [id number]  . Order #: [id number]  . Order #: [id number]  . Order #: [id number]     PHYSICAL EXAM: Filed Vitals:   10/16/11 0955  BP: 96/52  Pulse: 87   General:  Very thin. Well appearing. No resp difficulty HEENT: normal Neck: supple. JVP flat. Carotids 2+ bilaterally; no bruits. No lymphadenopathy or thryomegaly appreciated. Cor: PMI normal. Regular rate & rhythm. No rubs, gallops or murmurs. Lungs: clear Abdomen: soft, nontender, nondistended. No hepatosplenomegaly. No bruits or masses. Good bowel sounds. Extremities: no cyanosis, clubbing, rash, edema Neuro:  alert & orientedx3, cranial nerves grossly intact. Moves all 4 extremities w/o difficulty. Affect pleasant.    ASSESSMENT & PLAN:

## 2011-10-16 NOTE — Assessment & Plan Note (Signed)
Cardiomyopathy resolved. Chemotherapy complete. Given soft BP can stop carvedilol. F/u as needed.

## 2011-10-23 ENCOUNTER — Telehealth: Payer: Self-pay | Admitting: Internal Medicine

## 2011-10-23 NOTE — Telephone Encounter (Signed)
Patient called due to the letter stating: "Anemia is present; please complete stool cards & repeat CBC with iron panel, B12 & folate levels(Code: 285.9).PLEASE BRING THESE INSTRUCTIONS TO FOLLOW UP LAB APPOINTMENT.This will guarantee correct labs are drawn, eliminating need for repeat blood sampling ( needle sticks ! ).  All other labs are excellent." Can you tell me what the normal time is for re-checks for this please & I will call her back to set up the appointment Thanks Judeth Cornfield

## 2011-10-23 NOTE — Telephone Encounter (Signed)
@   patient's convenience if its not specifically stated by physician.

## 2011-10-29 ENCOUNTER — Other Ambulatory Visit: Payer: 59

## 2011-10-29 ENCOUNTER — Other Ambulatory Visit (INDEPENDENT_AMBULATORY_CARE_PROVIDER_SITE_OTHER): Payer: 59

## 2011-10-29 DIAGNOSIS — Z23 Encounter for immunization: Secondary | ICD-10-CM

## 2011-10-29 DIAGNOSIS — D649 Anemia, unspecified: Secondary | ICD-10-CM

## 2011-10-29 LAB — IBC PANEL
Iron: 81 ug/dL (ref 42–145)
Transferrin: 268.4 mg/dL (ref 212.0–360.0)

## 2011-10-29 LAB — CBC WITH DIFFERENTIAL/PLATELET
Basophils Absolute: 0 10*3/uL (ref 0.0–0.1)
Eosinophils Absolute: 0.1 10*3/uL (ref 0.0–0.7)
Eosinophils Relative: 1.7 % (ref 0.0–5.0)
MCHC: 33.7 g/dL (ref 30.0–36.0)
MCV: 94.5 fl (ref 78.0–100.0)
Monocytes Absolute: 0.3 10*3/uL (ref 0.1–1.0)
Neutrophils Relative %: 54 % (ref 43.0–77.0)
Platelets: 245 10*3/uL (ref 150.0–400.0)
WBC: 4 10*3/uL — ABNORMAL LOW (ref 4.5–10.5)

## 2011-11-02 LAB — FOLATE: Folate: >24.8 ng/mL (ref >5.9)

## 2011-11-16 ENCOUNTER — Ambulatory Visit (INDEPENDENT_AMBULATORY_CARE_PROVIDER_SITE_OTHER): Payer: 59 | Admitting: Internal Medicine

## 2011-11-16 VITALS — BP 112/74 | HR 96 | Temp 99.3°F | Wt 94.0 lb

## 2011-11-16 DIAGNOSIS — J069 Acute upper respiratory infection, unspecified: Secondary | ICD-10-CM

## 2011-11-16 MED ORDER — AZITHROMYCIN 250 MG PO TABS: ORAL_TABLET | ORAL | Status: AC

## 2011-11-16 MED ORDER — AZELASTINE HCL 0.1 % NA SOLN: 2.0000 | Freq: Every day | NASAL | Status: DC

## 2011-11-16 NOTE — Progress Notes (Signed)
  Subjective:    Patient ID: [name], female    DOB: May 05, 1951, 61 y.o.   MRN: [id number]  HPI Acute visit Symptoms started 5 days ago with green and yellow nasal discharge. 3 days ago developed a sore throat and head congestion. Did try Alka-Seltzer which caused diarrhea so she self discontinued. The patient has a history of asthma, denies any increasing wheezing.   Past Medical History  Diagnosis Date  . Anemia     B12 deficient  . GERD (gastroesophageal reflux disease)   . Hyperlipidemia   . Cancer     left breast.  . Osteoporosis   . Fibromyalgia   . FH: colonic polyps   . B12 deficiency   . Migraines   . IBS (irritable bowel syndrome)   . LV dysfunction     iatrogenic ; on Carvedilol   . Diverticulitis 2002   Past Surgical History  Procedure Date  . Abdominal hysterectomy     BSO for  Endometriosis  . Carpal tunnel release     left  . Cervical disc surgery   . Septoplasty   . Mastectomy     bilateral  . Esophageal dilation 2005     Review of Systems Admits to low grade temperature with the onset of symptoms, around 99 and occasional chills. No chest pain or shortness of breath, occ cough She does not have a history of allergies but admits to sneezing and congestion and watery eyes. Hoarse since onset of sx      Objective:   Physical Exam   General -- alert, well-developed, and well-nourished. NAD  Neck --no LADs, hoarse voice , no stridor HEENT -- TMs normal, throat w/o redness, face symmetric and not tender to palpation. Nose slt congested  Lungs -- normal respiratory effort, no intercostal retractions, no accessory muscle use. Few ronchi B Heart-- normal rate, regular rhythm, no murmur, and no gallop.         Assessment & Plan:  URI, presents with upper respiratory symptoms, she is running a low-grade temp. Viral versus early sinusitis. She is allergic to penicillin and doxycycline. We'll treat her with a Z-Pak. See instructions

## 2011-11-16 NOTE — Patient Instructions (Signed)
Rest, fluids , tylenol If  cough, take Mucinex DM twice a day as needed  For congestion use astelin nasal spray until better  Take the antibiotic as prescribed ----> zithromax Call if no better in few days Call anytime if the symptoms are severe

## 2011-11-17 ENCOUNTER — Encounter: Payer: Self-pay | Admitting: Internal Medicine

## 2011-11-24 ENCOUNTER — Other Ambulatory Visit: Payer: Self-pay | Admitting: Internal Medicine

## 2011-11-26 NOTE — Telephone Encounter (Signed)
Refill denied per dr. Drue Novel

## 2011-12-08 ENCOUNTER — Ambulatory Visit: Payer: 59 | Admitting: Internal Medicine

## 2011-12-10 ENCOUNTER — Ambulatory Visit (INDEPENDENT_AMBULATORY_CARE_PROVIDER_SITE_OTHER): Payer: 59 | Admitting: Internal Medicine

## 2011-12-10 ENCOUNTER — Encounter: Payer: Self-pay | Admitting: Internal Medicine

## 2011-12-10 VITALS — BP 102/60 | HR 84 | Wt 97.0 lb

## 2011-12-10 DIAGNOSIS — Z86010 Personal history of colonic polyps: Secondary | ICD-10-CM

## 2011-12-10 DIAGNOSIS — T887XXA Unspecified adverse effect of drug or medicament, initial encounter: Secondary | ICD-10-CM | POA: Insufficient documentation

## 2011-12-10 DIAGNOSIS — D518 Other vitamin B12 deficiency anemias: Secondary | ICD-10-CM

## 2011-12-10 LAB — CBC WITH DIFFERENTIAL/PLATELET
Basophils Relative: 0.5 % (ref 0.0–3.0)
Eosinophils Relative: 1.2 % (ref 0.0–5.0)
Lymphocytes Relative: 42.4 % (ref 12.0–46.0)
MCV: 95.9 fl (ref 78.0–100.0)
Monocytes Relative: 11.1 % (ref 3.0–12.0)
Neutrophils Relative %: 44.8 % (ref 43.0–77.0)
RBC: 3.5 Mil/uL — ABNORMAL LOW (ref 3.87–5.11)
WBC: 4.3 10*3/uL — ABNORMAL LOW (ref 4.5–10.5)

## 2011-12-10 NOTE — Patient Instructions (Addendum)
Please complete  stool cards .Share results with Dr Nicholas Lose & Dr Kinnie Scales. Please try to go on My Chart within the next 24 hours to allow me to release the results directly to you.

## 2011-12-10 NOTE — Progress Notes (Signed)
  Subjective:    Patient ID: [name], female    DOB: [birth year], 61 y.o.   MRN: [id number]  HPI Her anemia had progressed slightly as of 10/29/11. Hemoglobin was 10.9 hematocrit 32.5. The anemia was normochromic, normocytic. White count was 4000 with normal differential. Platelet count was normal. Iron, B12, and folate levels were also normal.  Her past history is positive for GERD with esophageal stricture, irritable bowel syndrome, and colon polyps. She saw her gastroenterologist for followup of her IBS in December  Her sister had colon cancer    Review of Systems She does have intermittent dysphagia with foods such as bread. She denies a Weight loss, abdominal pain, melena, rectal bleeding, or pencil thin  stools.  She denies epistaxis, hemoptysis, or abnormal bruising or  bleeding. Hematuria was evaluated by a urologist and no significant pathology found.     Objective:   Physical Exam General appearance : thin but with   good health and nourishment w/o distress.  Eyes: No conjunctival inflammation or scleral icterus is present.  Oral exam: Dental hygiene is good; lips and gums are healthy appearing.There is no oropharyngeal erythema or exudate noted.   Heart:  Normal rate and regular rhythm. S1 and S2 normal without gallop, murmur, click.S 4  Lungs:Chest clear to auscultation; no wheezes, rhonchi,rales ,or rubs present.No increased work of breathing.   Abdomen: bowel sounds normal, soft and non-tender without masses, organomegaly or hernias noted.  No guarding or rebound   Skin:Warm & dry.  Intact without suspicious lesions or rashes ; no jaundice or tenting  Lymphatic: No lymphadenopathy is noted about the head, neck, axilla areas.              Assessment & Plan:  #1 anemia, minimally decrease from 1/29-2/28/13. Normal B12, folate, and iron levels.  Plan: CBC and differential will be rechecked. If the anemia is progressive GI referral indicated.

## 2011-12-25 ENCOUNTER — Other Ambulatory Visit (INDEPENDENT_AMBULATORY_CARE_PROVIDER_SITE_OTHER): Payer: 59

## 2011-12-25 DIAGNOSIS — Z1289 Encounter for screening for malignant neoplasm of other sites: Secondary | ICD-10-CM

## 2011-12-25 LAB — POC HEMOCCULT BLD/STL (OFFICE/1-CARD/DIAGNOSTIC): Fecal Occult Blood, POC: NEGATIVE

## 2012-01-01 ENCOUNTER — Telehealth: Payer: Self-pay | Admitting: Oncology

## 2012-01-01 NOTE — Telephone Encounter (Signed)
S/w the pt and she is aware of her r/s may appts tlo June due to the md is out of the office

## 2012-01-06 ENCOUNTER — Other Ambulatory Visit: Payer: Self-pay | Admitting: Oncology

## 2012-01-06 DIAGNOSIS — Z853 Personal history of malignant neoplasm of breast: Secondary | ICD-10-CM

## 2012-01-08 ENCOUNTER — Other Ambulatory Visit: Payer: 59 | Admitting: Lab

## 2012-01-15 ENCOUNTER — Ambulatory Visit: Payer: 59 | Admitting: Oncology

## 2012-02-15 ENCOUNTER — Other Ambulatory Visit (HOSPITAL_BASED_OUTPATIENT_CLINIC_OR_DEPARTMENT_OTHER): Payer: 59 | Admitting: Lab

## 2012-02-15 ENCOUNTER — Other Ambulatory Visit: Payer: Self-pay | Admitting: Oncology

## 2012-02-15 DIAGNOSIS — Z853 Personal history of malignant neoplasm of breast: Secondary | ICD-10-CM

## 2012-02-15 DIAGNOSIS — C50119 Malignant neoplasm of central portion of unspecified female breast: Secondary | ICD-10-CM

## 2012-02-15 DIAGNOSIS — E559 Vitamin D deficiency, unspecified: Secondary | ICD-10-CM

## 2012-02-15 LAB — CBC WITH DIFFERENTIAL/PLATELET
BASO%: 1.2 % (ref 0.0–2.0)
EOS%: 2.4 % (ref 0.0–7.0)
MCH: 31.2 pg (ref 25.1–34.0)
MCHC: 33.7 g/dL (ref 31.5–36.0)
MONO#: 0.4 10*3/uL (ref 0.1–0.9)
RBC: 3.6 10*6/uL — ABNORMAL LOW (ref 3.70–5.45)
RDW: 13.3 % (ref 11.2–14.5)
WBC: 5.1 10*3/uL (ref 3.9–10.3)
lymph#: 1.5 10*3/uL (ref 0.9–3.3)

## 2012-02-16 LAB — COMPREHENSIVE METABOLIC PANEL
ALT: 12 U/L (ref 0–35)
AST: 17 U/L (ref 0–37)
CO2: 30 mEq/L (ref 19–32)
Calcium: 9.2 mg/dL (ref 8.4–10.5)
Chloride: 103 mEq/L (ref 96–112)
Sodium: 141 mEq/L (ref 135–145)
Total Protein: 6.4 g/dL (ref 6.0–8.3)

## 2012-02-16 LAB — VITAMIN D 25 HYDROXY (VIT D DEFICIENCY, FRACTURES): Vit D, 25-Hydroxy: 51 ng/mL (ref 30–89)

## 2012-02-22 ENCOUNTER — Ambulatory Visit (HOSPITAL_BASED_OUTPATIENT_CLINIC_OR_DEPARTMENT_OTHER): Payer: 59 | Admitting: Oncology

## 2012-02-22 ENCOUNTER — Other Ambulatory Visit: Payer: 59 | Admitting: Lab

## 2012-02-22 VITALS — BP 107/65 | HR 87 | Temp 98.7°F | Ht 61.0 in | Wt 95.6 lb

## 2012-02-22 DIAGNOSIS — Z9010 Acquired absence of unspecified breast and nipple: Secondary | ICD-10-CM

## 2012-02-22 DIAGNOSIS — C50119 Malignant neoplasm of central portion of unspecified female breast: Secondary | ICD-10-CM

## 2012-02-22 DIAGNOSIS — Z853 Personal history of malignant neoplasm of breast: Secondary | ICD-10-CM

## 2012-02-22 NOTE — Progress Notes (Signed)
Hematology and Oncology Follow Up Visit  [name] [id number] 22-Nov-1950 61 y.o. 02/22/2012 2:58 PM   Principle Diagnosis: History of breast cancer status post 4 cycles of a.c., Taxol and Herceptin chemotherapy initially on study. That is post bilateral mastectomies breast reconstruction with implants 05/01/2009 currently on Femara 2. history of cardiomyopathy followed by cardiology 3. History of hematuria  Interim History:  [name] returns for followup. She is doing well. Continues on the same medications as before including the bone density medication as well as vitamin D. She has no complaints. She is due to see her gynecologist this week and get a bone density test. Her medications as noted her the same. Her cardio vascular status is stable  Medications: I have reviewed the patient's current medications.  Allergies:  Allergies  Allergen Reactions  . Venlafaxine     ? reaction  . Amoxicillin-Pot Clavulanate     diarrhea  . Doxycycline     Nausea & vomiting    Past Medical History, Surgical history, Social history, and Family History were reviewed and updated.  Review of Systems: Constitutional:  Negative for fever, chills, night sweats, anorexia, weight loss, pain. Cardiovascular: no chest pain or dyspnea on exertion Respiratory: no cough, shortness of breath, or wheezing Neurological: no TIA or stroke symptoms Dermatological: negative ENT: negative Skin Gastrointestinal: no abdominal pain, change in bowel habits, or black or bloody stools Genito-Urinary: no dysuria, trouble voiding, or hematuria Hematological and Lymphatic: negative Breast: negative for breast lumps Musculoskeletal: negative Remaining ROS negative.  Physical Exam: Blood pressure 107/65, pulse 87, temperature 98.7 F (37.1 C), height 5\' 1"  (1.549 m), weight 95 lb 9.6 oz (43.364 kg). ECOG: 0 General appearance: alert, cooperative and appears stated age Head: Normocephalic, without obvious  abnormality, atraumatic Neck: no adenopathy, no carotid bruit, no JVD, supple, symmetrical, trachea midline and thyroid not enlarged, symmetric, no tenderness/mass/nodules Lymph nodes: Cervical, supraclavicular, and axillary nodes normal. Cardiac : Normal heart sounds, no JVD Pulmonary: Normal  Breath sounds Breasts: Status post bilateral implants, no masses  Abdomen: No mass Extremities no cyanosis clubbing or edema Neuro: Grossly normal  Lab Results: Lab Results  Component Value Date   WBC 5.1 02/15/2012   HGB 11.2* 02/15/2012   HCT 33.3* 02/15/2012   MCV 92.5 02/15/2012   PLT 297 02/15/2012     Chemistry      Component Value Date/Time   NA 141 02/15/2012 1405   K 4.1 02/15/2012 1405   CL 103 02/15/2012 1405   CO2 30 02/15/2012 1405   BUN 18 02/15/2012 1405   CREATININE 0.84 02/15/2012 1405      Component Value Date/Time   CALCIUM 9.2 02/15/2012 1405   ALKPHOS 51 02/15/2012 1405   AST 17 02/15/2012 1405   ALT 12 02/15/2012 1405   BILITOT 0.3 02/15/2012 1405       Radiological Studies: chest X-ray n/a Mammogram N./a Bone density Due   Impression and Plan: This medicine is doing well she continues on Femara. She has regular followup with cardiology. I will see her in 6 months time and she'll remain on Femara. Her weight is stable and her BMI is 18.1. We discussed implications of this.   More than 50% of the visit was spent in patient-related counselling   Pierce Crane, MD 6/24/20132:58 PM

## 2012-02-23 ENCOUNTER — Telehealth: Payer: Self-pay | Admitting: *Deleted

## 2012-02-23 NOTE — Telephone Encounter (Signed)
Made patient appointment for 08-2012

## 2012-02-25 ENCOUNTER — Other Ambulatory Visit: Payer: Self-pay | Admitting: Gynecology

## 2012-03-14 ENCOUNTER — Other Ambulatory Visit: Payer: Self-pay | Admitting: Oncology

## 2012-03-14 DIAGNOSIS — C50919 Malignant neoplasm of unspecified site of unspecified female breast: Secondary | ICD-10-CM

## 2012-09-06 ENCOUNTER — Ambulatory Visit (INDEPENDENT_AMBULATORY_CARE_PROVIDER_SITE_OTHER): Payer: 59 | Admitting: Internal Medicine

## 2012-09-06 ENCOUNTER — Encounter: Payer: Self-pay | Admitting: Internal Medicine

## 2012-09-06 VITALS — BP 100/62 | HR 84 | Temp 98.0°F | Resp 12 | Ht 61.0 in | Wt 106.2 lb

## 2012-09-06 DIAGNOSIS — Z0000 Encounter for general adult medical examination without abnormal findings: Secondary | ICD-10-CM

## 2012-09-06 DIAGNOSIS — R0601 Orthopnea: Secondary | ICD-10-CM

## 2012-09-06 DIAGNOSIS — I428 Other cardiomyopathies: Secondary | ICD-10-CM

## 2012-09-06 DIAGNOSIS — R0989 Other specified symptoms and signs involving the circulatory and respiratory systems: Secondary | ICD-10-CM

## 2012-09-06 MED ORDER — ALBUTEROL SULFATE HFA 108 (90 BASE) MCG/ACT IN AERS: 2.0000 | INHALATION_SPRAY | RESPIRATORY_TRACT | Status: DC | PRN

## 2012-09-06 MED ORDER — KETOCONAZOLE 2 % EX CREA: TOPICAL_CREAM | Freq: Two times a day (BID) | CUTANEOUS | Status: DC

## 2012-09-06 NOTE — Patient Instructions (Addendum)
Please  schedule fasting Labs : TSH,Lipids. PLEASE BRING THESE INSTRUCTIONS TO FOLLOW UP  LAB APPOINTMENT.This will guarantee correct labs are drawn, eliminating need for repeat blood sampling ( needle sticks ! ). Diagnoses /Codes: V70.0  Please review the medication list in the After Visit Summary provided.Please write the name of the prescribing physician to the right of the medication and share this with all medical staff seen at each appointment. This will help provide continuity of care; help optimize therapeutic interventions;and help prevent drug:drug adverse reaction.   If you activate My Chart; the results can be released to you as soon as they populate from the lab. If you choose not to use this program; the labs have to be reviewed, copied & mailed   causing a delay in getting the results to you.

## 2012-09-06 NOTE — Progress Notes (Signed)
Subjective:    Patient ID: [name], female    DOB: [birth year], 62 y.o.   MRN: [id number]  HPI  [name]  is here for a physical;acute issues include intermittent orthopnea & DOE      Review of Systems The orthopnea and exertional dyspnea began after her breast surgery and chemotherapy. She states that anatomically that placement of the  breast implants were somewhat restrictive due to her body habitus. Cold air also causes some  symptoms as well. She denies a history of asthma. She quit smoking in 1992 after 19 years up to 1 ppd.  She was diagnosed as having iatrogenic cardiomyopathy following chemotherapy. She denies significant edema of the ankles; she has no paroxysmal nocturnal dyspnea.     Objective:   Physical Exam Gen.: thin but healthy and well-nourished in appearance. Alert, appropriate and cooperative throughout exam. Head: Normocephalic without obvious abnormalities  Eyes: No corneal or conjunctival inflammation noted. Pupils equal round reactive to light and accommodation. Fundal exam is benign without hemorrhages, exudate, papilledema. Extraocular motion intact. Vision grossly normal. Ears: External  ear exam reveals no significant lesions or deformities.  Hearing aids bilaterally. Nose: External nasal exam reveals no deformity or inflammation. Nasal mucosa are pink and moist. No lesions or exudates noted.  Mouth: Oral mucosa and oropharynx reveal no lesions or exudates. Teeth in good repair. Neck: No deformities, masses, or tenderness noted. Range of motion decreased. Thyroid : L lobe > R lobe. No NVD @ 5 degrees Lungs: Normal respiratory effort; chest expands symmetrically. Lungs are clear to auscultation without rales, wheezes, or increased work of breathing. Heart: Normal rate and rhythm. Normal S1 and S2. No gallop, click, or rub. Grade 1/2 over 6 systolic murmur  Abdomen: Bowel sounds normal; abdomen soft and nontender. No masses, organomegaly or hernias  noted.Slight dullness RUQ. No HJR Genitalia:as per Gyn .                                                                                   Musculoskeletal/extremities: There is some asymmetry of the posterior thoracic musculature suggesting occult scoliosis.  No clubbing, cyanosis, edema, or deformity noted. Range of motion  normal .Tone & strength  normal.Joints normal. Nail health  good. Vascular: Carotid, radial artery, dorsalis pedis and  posterior tibial pulses are full and equal. No bruits present. Neurologic: Alert and oriented x3. Deep tendon reflexes symmetrical and normal.          Skin: Intact without suspicious lesions or rashes. Lymph: No cervical, axillary lymphadenopathy present. Psych: Mood and affect are normal. Normally interactive                                                                                       Assessment & Plan:  #1 comprehensive physical exam; no acute findings #2 orthopnea , DOE  Plan: see Orders

## 2012-09-08 ENCOUNTER — Other Ambulatory Visit: Payer: 59 | Admitting: Lab

## 2012-09-08 ENCOUNTER — Ambulatory Visit: Payer: 59 | Admitting: Oncology

## 2012-09-12 ENCOUNTER — Telehealth: Payer: Self-pay | Admitting: Emergency Medicine

## 2012-09-12 NOTE — Telephone Encounter (Signed)
Left message on identified voicemail with patient's new appointments for lab and Dr Welton Flakes on 1/28 at 9:30 and 10:00.

## 2012-09-13 ENCOUNTER — Other Ambulatory Visit (INDEPENDENT_AMBULATORY_CARE_PROVIDER_SITE_OTHER): Payer: 59

## 2012-09-13 ENCOUNTER — Ambulatory Visit (INDEPENDENT_AMBULATORY_CARE_PROVIDER_SITE_OTHER)
Admission: RE | Admit: 2012-09-13 | Discharge: 2012-09-13 | Disposition: A | Discharge status: Home / Self Care | Payer: 59 | Source: Ambulatory Visit | Attending: Internal Medicine | Admitting: Internal Medicine

## 2012-09-13 DIAGNOSIS — I428 Other cardiomyopathies: Secondary | ICD-10-CM

## 2012-09-13 DIAGNOSIS — R0601 Orthopnea: Secondary | ICD-10-CM

## 2012-09-13 DIAGNOSIS — R0989 Other specified symptoms and signs involving the circulatory and respiratory systems: Secondary | ICD-10-CM

## 2012-09-13 DIAGNOSIS — Z0000 Encounter for general adult medical examination without abnormal findings: Secondary | ICD-10-CM

## 2012-09-13 LAB — LIPID PANEL
LDL Cholesterol: 121 mg/dL — ABNORMAL HIGH (ref 0–99)
Total CHOL/HDL Ratio: 3
Triglycerides: 85 mg/dL (ref 0.0–149.0)

## 2012-09-21 ENCOUNTER — Other Ambulatory Visit: Payer: Self-pay | Admitting: Oncology

## 2012-09-22 ENCOUNTER — Encounter: Payer: Self-pay | Admitting: Oncology

## 2012-09-23 ENCOUNTER — Other Ambulatory Visit: Payer: Self-pay | Admitting: *Deleted

## 2012-09-23 DIAGNOSIS — C50919 Malignant neoplasm of unspecified site of unspecified female breast: Secondary | ICD-10-CM

## 2012-09-27 ENCOUNTER — Telehealth: Payer: Self-pay | Admitting: Oncology

## 2012-09-27 ENCOUNTER — Other Ambulatory Visit (HOSPITAL_BASED_OUTPATIENT_CLINIC_OR_DEPARTMENT_OTHER): Payer: 59 | Admitting: Lab

## 2012-09-27 ENCOUNTER — Ambulatory Visit: Payer: 59 | Admitting: Adult Health

## 2012-09-27 ENCOUNTER — Ambulatory Visit (HOSPITAL_BASED_OUTPATIENT_CLINIC_OR_DEPARTMENT_OTHER): Payer: 59 | Admitting: Oncology

## 2012-09-27 ENCOUNTER — Other Ambulatory Visit: Payer: 59 | Admitting: Lab

## 2012-09-27 ENCOUNTER — Encounter: Payer: Self-pay | Admitting: Oncology

## 2012-09-27 VITALS — BP 106/68 | HR 83 | Temp 98.4°F | Resp 20 | Ht 61.0 in | Wt 105.1 lb

## 2012-09-27 DIAGNOSIS — Z9010 Acquired absence of unspecified breast and nipple: Secondary | ICD-10-CM

## 2012-09-27 DIAGNOSIS — D0590 Unspecified type of carcinoma in situ of unspecified breast: Secondary | ICD-10-CM

## 2012-09-27 DIAGNOSIS — Z853 Personal history of malignant neoplasm of breast: Secondary | ICD-10-CM

## 2012-09-27 DIAGNOSIS — C50919 Malignant neoplasm of unspecified site of unspecified female breast: Secondary | ICD-10-CM

## 2012-09-27 LAB — CBC WITH DIFFERENTIAL/PLATELET
EOS%: 2 % (ref 0.0–7.0)
Eosinophils Absolute: 0.1 10*3/uL (ref 0.0–0.5)
LYMPH%: 33 % (ref 14.0–49.7)
MCH: 30.6 pg (ref 25.1–34.0)
MCHC: 33.7 g/dL (ref 31.5–36.0)
MCV: 90.7 fL (ref 79.5–101.0)
MONO%: 9.4 % (ref 0.0–14.0)
Platelets: 275 10*3/uL (ref 145–400)
RBC: 3.69 10*6/uL — ABNORMAL LOW (ref 3.70–5.45)
RDW: 13.4 % (ref 11.2–14.5)

## 2012-09-27 LAB — COMPREHENSIVE METABOLIC PANEL (CC13)
AST: 20 U/L (ref 5–34)
Albumin: 3.7 g/dL (ref 3.5–5.0)
Alkaline Phosphatase: 68 U/L (ref 40–150)
Glucose: 96 mg/dl (ref 70–99)
Potassium: 3.7 mEq/L (ref 3.5–5.1)
Sodium: 139 mEq/L (ref 136–145)
Total Bilirubin: 0.28 mg/dL (ref 0.20–1.20)
Total Protein: 7.3 g/dL (ref 6.4–8.3)

## 2012-09-27 MED ORDER — LETROZOLE 2.5 MG PO TABS: 2.5000 mg | ORAL_TABLET | Freq: Every day | ORAL | Status: DC

## 2012-09-27 NOTE — Telephone Encounter (Signed)
gv and printed appt schedule for pt for July...the patient aware °

## 2012-09-27 NOTE — Progress Notes (Signed)
OFFICE PROGRESS NOTE  CC  Rachael Melnick, MD [phone] [facility] [address] [place] [address] Rachael. Cyndia Peterson Rachael. Nicholes Peterson  DIAGNOSIS: 62 year old female with invasive ductal carcinoma with high-grade ductal carcinoma in situ of the left breast diagnosed April 2009.  PRIOR THERAPY:  #1 patient presented with a digital screening mammogram on 11/24/2007 that showed a possible distortion of the left breast. She went on to have a ultrasound-guided biopsy on 12/05/2007 that showed invasive ductal carcinoma with high-grade DCIS. The tumor was ER positive PR positive at 26% and 11% respectively. HER-2/neu was 3+ and positive with a proliferation index Ki-67 at 12%. On 18 [name] 2009 patient had MRI of the breasts performed that showed a solitary area of enhancement at the 12:00 position. This measured 2.5 x 2.4 x 1.8 cm. No other abnormalities were seen.  #2 patient was enrolled on NSABP B. 41 clinical study. She received Adriamycin Cytoxan total of 4 cycles from 01/10/2008 2 03/13/2008. She then began Taxol with lapatinib and Herceptin 3 weeks on one week off starting on 8 2009. Patient failed to begin oral lapatinib and she went off study. Therefore she received 11 doses of Taxol and Herceptin.  #3 she then went on to have bilateral mastectomies with reconstruction on 08/08/2008. This was a patient preference. Her final revealed on the left side DCIS involving an area of 1.8 cm there was no invasive disease noted. She had sentinel lymph node biopsy performed that revealed no evidence of malignancy. Right breast showed extensive fibrocystic changes without any evidence of malignancy   #5 patient began letrozole 2.5 mg starting March 2010. Herceptin was on hold due to drop in her ejection fractions. 11/19/2005 her MUGA was 57% and Herceptin was discontinued.  CURRENT THERAPY:letrozole 2.5 mg daily.  INTERVAL HISTORY: [name] 62 y.o. female returns for followup  visit today. Overall she seems to be doing well without any significant complaints. Her last visit with Rachael. Pierce Peterson was on 02/22/2012. She has no fevers chills night sweats headaches shortness of breath chest pains palpitations. Her last bone density was in May 2012. She continues to be followed by cardiology for cardiomyopathy.today she is without any complaints. Remainder of the 10 point review of systems is negative. MEDICAL HISTORY: Past Medical History  Diagnosis Date  . Anemia     B12 deficient  . GERD (gastroesophageal reflux disease)   . Hyperlipidemia   . Cancer     left breast.  . Osteoporosis     Rachael [name]  . Fibromyalgia   . B12 deficiency   . Migraines   . IBS (irritable bowel syndrome)   . LV dysfunction     iatrogenic from chemotherapy ; on Carvedilol   . Diverticulitis 2002  . Breast cancer     ALLERGIES:  is allergic to amoxicillin-pot clavulanate; doxycycline; and venlafaxine.  MEDICATIONS:  Current Outpatient Prescriptions  Medication Sig Dispense Refill  . albuterol (PROVENTIL HFA;VENTOLIN HFA) 108 (90 BASE) MCG/ACT inhaler Inhale 2 puffs into the lungs every 4 (four) hours as needed.  8.5 g  2  . Ascorbic Acid (VITAMIN C) 1000 MG tablet Take 1,000 mg by mouth daily.      . Cholecalciferol (VITAMIN D3) 2000 UNITS TABS Take 1 tablet by mouth daily. 2 by mouth once daily      . citalopram (CELEXA) 40 MG tablet Take 40 mg by mouth daily.      . cyanocobalamin 500 MCG tablet Take 1,000 mcg  by mouth daily.       . cyclobenzaprine (FLEXERIL) 10 MG tablet Take 10 mg by mouth at bedtime.      Marland Kitchen letrozole (FEMARA) 2.5 MG tablet Take 1 tablet (2.5 mg total) by mouth daily.  90 tablet  6  . lidocaine (LIDODERM) 5 % Place 1 patch onto the skin daily. Remove & Discard patch within 12 hours or as directed by MD      . lubiprostone (AMITIZA) 8 MCG capsule Take 8 mcg by mouth 2 (two) times daily with a meal.        . methocarbamol (ROBAXIN) 500 MG tablet Take 500 mg by  mouth. 1/2 by mouth two times daily      . Multiple Vitamins-Minerals (MULTIVITAMIN PO) Take by mouth daily.      . Risedronate Sodium (ATELVIA) 35 MG TBEC Take by mouth once a week.       . traMADol (ULTRAM) 50 MG tablet Take 50 mg by mouth. Maximum dose= 8 tablets per day       . ALPRAZolam (XANAX) 0.5 MG tablet Take 0.5 mg by mouth at bedtime as needed.      . cetirizine (ZYRTEC) 10 MG tablet Take 10 mg by mouth daily.      Marland Kitchen ketoconazole (NIZORAL) 2 % cream Apply topically 2 (two) times daily.  15 g  1    SURGICAL HISTORY:  Past Surgical History  Procedure Date  . Abdominal hysterectomy     BSO for  Endometriosis  . Carpal tunnel release     left  . Cervical disc surgery   . Septoplasty   . Mastectomy 2009    bilateral, [name]  . Esophageal dilation 2005    Rachael [name]  . Colonoscopy w/ polypectomy 1999    Rachael [name]  . Colonoscopy 2009    negative    REVIEW OF SYSTEMS:  Pertinent items are noted in HPI.   HEALTH MAINTENANCE: PHYSICAL EXAMINATION: Blood pressure 106/68, pulse 83, temperature 98.4 F (36.9 C), temperature source Oral, resp. rate 20, height 5\' 1"  (1.549 m), weight 105 lb 1.6 oz (47.673 kg). Body mass index is 19.86 kg/(m^2). ECOG PERFORMANCE STATUS: 0 - Asymptomatic   General appearance: alert, cooperative and appears stated age Lymph nodes: Cervical, supraclavicular, and axillary nodes normal. Resp: clear to auscultation bilaterally Back: symmetric, no curvature. ROM normal. No CVA tenderness. Cardio: regular rate and rhythm GI: soft, non-tender; bowel sounds normal; no masses,  no organomegaly Extremities: extremities normal, atraumatic, no cyanosis or edema Neurologic: Grossly normal   LABORATORY DATA: Lab Results  Component Value Date   WBC 5.2 09/27/2012   HGB 11.3* 09/27/2012   HCT 33.5* 09/27/2012   MCV 90.7 09/27/2012   PLT 275 09/27/2012      Chemistry      Component Value Date/Time   NA 141 02/15/2012 1405   K 4.1 02/15/2012 1405   CL  103 02/15/2012 1405   CO2 30 02/15/2012 1405   BUN 18 02/15/2012 1405   CREATININE 0.84 02/15/2012 1405      Component Value Date/Time   CALCIUM 9.2 02/15/2012 1405   ALKPHOS 51 02/15/2012 1405   AST 17 02/15/2012 1405   ALT 12 02/15/2012 1405   BILITOT 0.3 02/15/2012 1405       RADIOGRAPHIC STUDIES:  Dg Chest 2 View  09/13/2012  *RADIOLOGY REPORT*  Clinical Data: Chronic shortness of breath, prior smoker, breast cancer  CHEST - 2 VIEW  Comparison: 08/03/2008  Findings: Lower  cervical fusion hardware noted.  Right port catheter has been removed.  Postop changes bilaterally with dense overlying breast shadows.  Background hyperinflation noted suspicious for COPD/emphysema.  No focal airspace process, edema, collapse, consolidation, effusion or pneumothorax.  IMPRESSION: Postop changes.  Hyperinflation.  No superimposed acute process   Original Report Authenticated By: Judie Petit. Shick, M.D.     ASSESSMENT: 62 year old female with  #1 history of invasive ductal carcinoma of the left breast originally diagnosed in 2009. Patient initially was treated on NSABP B. 41 protocol she completed 4 cycles of Adriamycin and Cytoxan. She was initiated on oral lapatinib and IV Taxol on 04/10/2008 but she failed to start the oral lapatinib because of bad patient went off study. She went on to complete Herceptin and Taxol. Her course was complicated by development of cardiomyopathy and patient also had to discontinue Herceptin.  #2 on 08/08/2008 patient underwent a bilateral mastectomies with left sentinel lymph node biopsy. Her final pathology revealed on the left side high-grade ductal carcinoma in situ measuring 1.8 cm no evidence of invasive carcinoma. 4 sentinel nodes were negative for metastatic disease. Right simple mastectomy just revealed extensive fibrocystic changes with microcalcifications no malignancy.  #3 patient then went on to receive letrozole 2.5 mg starting March 2010. She continues on this and overall  she is tolerating it well.   PLAN:   #1 continue letrozole 2.5 mg daily.  #2 patient will be seen back in 6 months time for followup.   All questions were answered. The patient knows to call the clinic with any problems, questions or concerns. We can certainly see the patient much sooner if necessary.  I spent 40 minutes counseling the patient face to face. The total time spent in the appointment was 40 minutes.    Drue Second, MD Medical/Oncology [facility] [phone] (beeper) [phone] (Office)  09/27/2012, 10:49 AM

## 2012-09-27 NOTE — Patient Instructions (Addendum)
Continue letrozole 2.5 mg daily I will see you back in 6 months

## 2012-10-24 ENCOUNTER — Other Ambulatory Visit: Payer: Self-pay | Admitting: Oncology

## 2012-10-31 ENCOUNTER — Encounter: Payer: Self-pay | Admitting: Internal Medicine

## 2012-11-18 ENCOUNTER — Encounter: Payer: 59 | Admitting: Internal Medicine

## 2012-11-23 ENCOUNTER — Other Ambulatory Visit: Payer: Self-pay | Admitting: Oncology

## 2012-12-30 ENCOUNTER — Other Ambulatory Visit: Payer: Self-pay | Admitting: Medical Oncology

## 2012-12-30 NOTE — Telephone Encounter (Signed)
Error

## 2013-03-28 ENCOUNTER — Telehealth: Payer: Self-pay | Admitting: Oncology

## 2013-03-28 NOTE — Telephone Encounter (Signed)
, °

## 2013-03-29 ENCOUNTER — Ambulatory Visit (HOSPITAL_BASED_OUTPATIENT_CLINIC_OR_DEPARTMENT_OTHER): Payer: 59 | Admitting: Oncology

## 2013-03-29 ENCOUNTER — Other Ambulatory Visit (HOSPITAL_BASED_OUTPATIENT_CLINIC_OR_DEPARTMENT_OTHER): Payer: 59 | Admitting: Lab

## 2013-03-29 ENCOUNTER — Encounter: Payer: Self-pay | Admitting: Oncology

## 2013-03-29 ENCOUNTER — Telehealth: Payer: Self-pay | Admitting: Oncology

## 2013-03-29 VITALS — BP 99/64 | HR 80 | Temp 98.3°F | Resp 20 | Ht 61.0 in | Wt 105.9 lb

## 2013-03-29 DIAGNOSIS — Z853 Personal history of malignant neoplasm of breast: Secondary | ICD-10-CM

## 2013-03-29 DIAGNOSIS — M8580 Other specified disorders of bone density and structure, unspecified site: Secondary | ICD-10-CM

## 2013-03-29 DIAGNOSIS — M949 Disorder of cartilage, unspecified: Secondary | ICD-10-CM

## 2013-03-29 LAB — CBC WITH DIFFERENTIAL/PLATELET
Basophils Absolute: 0 10*3/uL (ref 0.0–0.1)
Eosinophils Absolute: 0.1 10*3/uL (ref 0.0–0.5)
HCT: 33.7 % — ABNORMAL LOW (ref 34.8–46.6)
HGB: 11.3 g/dL — ABNORMAL LOW (ref 11.6–15.9)
LYMPH%: 38.3 % (ref 14.0–49.7)
MONO#: 0.4 10*3/uL (ref 0.1–0.9)
NEUT#: 2.1 10*3/uL (ref 1.5–6.5)
NEUT%: 48.9 % (ref 38.4–76.8)
Platelets: 257 10*3/uL (ref 145–400)
RBC: 3.72 10*6/uL (ref 3.70–5.45)
WBC: 4.2 10*3/uL (ref 3.9–10.3)

## 2013-03-29 LAB — COMPREHENSIVE METABOLIC PANEL (CC13)
BUN: 14.8 mg/dL (ref 7.0–26.0)
CO2: 28 mEq/L (ref 22–29)
Glucose: 109 mg/dl (ref 70–140)
Sodium: 142 mEq/L (ref 136–145)
Total Bilirubin: 0.34 mg/dL (ref 0.20–1.20)
Total Protein: 7 g/dL (ref 6.4–8.3)

## 2013-03-29 NOTE — Telephone Encounter (Signed)
, °

## 2013-03-29 NOTE — Progress Notes (Signed)
OFFICE PROGRESS NOTE  CC  [name], MD [phone] [address] [facility] [address] [place] [address] Dr. Cyndia Bent Dr. Nicholes Mango  DIAGNOSIS: 62 year old female with invasive ductal carcinoma with high-grade ductal carcinoma in situ of the left breast diagnosed April 2009.  PRIOR THERAPY:  #1 patient presented with a digital screening mammogram on 11/24/2007 that showed a possible distortion of the left breast. She went on to have a ultrasound-guided biopsy on 12/05/2007 that showed invasive ductal carcinoma with high-grade DCIS. The tumor was ER positive PR positive at 26% and 11% respectively. HER-2/neu was 3+ and positive with a proliferation index Ki-67 at 12%. On 47 [name] patient had MRI of the breasts performed that showed a solitary area of enhancement at the 12:00 position. This measured 2.5 x 2.4 x 1.8 cm. No other abnormalities were seen.  #2 patient was enrolled on NSABP B. 41 clinical study. She received Adriamycin Cytoxan total of 4 cycles from 01/10/2008 2 03/13/2008. She then began Taxol with lapatinib and Herceptin 3 weeks on one week off starting on 8 2009. Patient failed to begin oral lapatinib and she went off study. Therefore she received 11 doses of Taxol and Herceptin.  #3 she then went on to have bilateral mastectomies with reconstruction on 08/08/2008. This was a patient preference. Her final revealed on the left side DCIS involving an area of 1.8 cm there was no invasive disease noted. She had sentinel lymph node biopsy performed that revealed no evidence of malignancy. Right breast showed extensive fibrocystic changes without any evidence of malignancy   #5 patient began letrozole 2.5 mg starting March 2010. Herceptin was on hold due to drop in her ejection fractions. 11/19/2005 her MUGA was 57% and Herceptin was discontinued.  CURRENT THERAPY:letrozole 2.5 mg daily.  INTERVAL HISTORY: [name] 62 y.o. female returns for followup  visit today. Overall she seems to be doing well without any significant complaints.She has no fevers chills night sweats headaches shortness of breath chest pains palpitations. Her last bone density was in May 2012. She continues to be followed by cardiology for cardiomyopathy.today she is without any complaints. Remainder of the 10 point review of systems is negative. MEDICAL HISTORY: Past Medical History  Diagnosis Date  . Anemia     B12 deficient  . GERD (gastroesophageal reflux disease)   . Hyperlipidemia   . Cancer     left breast.  . Osteoporosis     Dr Nicholas Lose  . Fibromyalgia   . B12 deficiency   . Migraines   . IBS (irritable bowel syndrome)   . LV dysfunction     iatrogenic from chemotherapy ; on Carvedilol   . Diverticulitis 2002  . Breast cancer     ALLERGIES:  is allergic to amoxicillin-pot clavulanate; doxycycline; and venlafaxine.  MEDICATIONS:  Current Outpatient Prescriptions  Medication Sig Dispense Refill  . albuterol (PROVENTIL HFA;VENTOLIN HFA) 108 (90 BASE) MCG/ACT inhaler Inhale 2 puffs into the lungs every 4 (four) hours as needed.  8.5 g  2  . ALPRAZolam (XANAX) 0.5 MG tablet Take 0.5 mg by mouth at bedtime as needed.      . Ascorbic Acid (VITAMIN C) 1000 MG tablet Take 1,000 mg by mouth daily.      . cetirizine (ZYRTEC) 10 MG tablet Take 10 mg by mouth daily.      . Cholecalciferol (VITAMIN D3) 2000 UNITS TABS Take 1 tablet by mouth daily. 2 by mouth once daily      .  citalopram (CELEXA) 40 MG tablet Take 40 mg by mouth daily.      . cyanocobalamin 500 MCG tablet Take 1,000 mcg by mouth daily.       . cyclobenzaprine (FLEXERIL) 10 MG tablet Take 10 mg by mouth at bedtime.      Marland Kitchen ketoconazole (NIZORAL) 2 % cream Apply topically 2 (two) times daily.  15 g  1  . letrozole (FEMARA) 2.5 MG tablet Take 1 tablet (2.5 mg total) by mouth daily.  90 tablet  6  . lidocaine (LIDODERM) 5 % Place 1 patch onto the skin daily. Remove & Discard patch within 12 hours or as  directed by MD      . lubiprostone (AMITIZA) 8 MCG capsule Take 8 mcg by mouth 2 (two) times daily with a meal.        . methocarbamol (ROBAXIN) 500 MG tablet Take 500 mg by mouth. 1/2 by mouth two times daily      . Multiple Vitamins-Minerals (MULTIVITAMIN PO) Take by mouth daily.      . traMADol (ULTRAM) 50 MG tablet Take 50 mg by mouth. Maximum dose= 8 tablets per day        No current facility-administered medications for this visit.    SURGICAL HISTORY:  Past Surgical History  Procedure Laterality Date  . Abdominal hysterectomy      BSO for  Endometriosis  . Carpal tunnel release      left  . Cervical disc surgery    . Septoplasty    . Mastectomy  2009    bilateral, Dr Jamey Ripa  . Esophageal dilation  2005    Dr Kinnie Scales  . Colonoscopy w/ polypectomy  1999    Dr Kinnie Scales  . Colonoscopy  2009    negative    REVIEW OF SYSTEMS:  Pertinent items are noted in HPI.   HEALTH MAINTENANCE: PHYSICAL EXAMINATION: Blood pressure 99/64, pulse 80, temperature 98.3 F (36.8 C), temperature source Oral, resp. rate 20, height 5\' 1"  (1.549 m), weight 105 lb 14.4 oz (48.036 kg). Body mass index is 20.02 kg/(m^2). ECOG PERFORMANCE STATUS: 0 - Asymptomatic   General appearance: alert, cooperative and appears stated age Lymph nodes: Cervical, supraclavicular, and axillary nodes normal. Resp: clear to auscultation bilaterally Back: symmetric, no curvature. ROM normal. No CVA tenderness. Cardio: regular rate and rhythm GI: soft, non-tender; bowel sounds normal; no masses,  no organomegaly Extremities: extremities normal, atraumatic, no cyanosis or edema Neurologic: Grossly normal   LABORATORY DATA: Lab Results  Component Value Date   WBC 4.2 03/29/2013   HGB 11.3* 03/29/2013   HCT 33.7* 03/29/2013   MCV 90.5 03/29/2013   PLT 257 03/29/2013      Chemistry      Component Value Date/Time   NA 142 03/29/2013 1037   NA 141 02/15/2012 1405   K 3.8 03/29/2013 1037   K 4.1 02/15/2012 1405   CL  102 09/27/2012 0936   CL 103 02/15/2012 1405   CO2 28 03/29/2013 1037   CO2 30 02/15/2012 1405   BUN 14.8 03/29/2013 1037   BUN 18 02/15/2012 1405   CREATININE 0.9 03/29/2013 1037   CREATININE 0.84 02/15/2012 1405      Component Value Date/Time   CALCIUM 9.6 03/29/2013 1037   CALCIUM 9.2 02/15/2012 1405   ALKPHOS 54 03/29/2013 1037   ALKPHOS 51 02/15/2012 1405   AST 24 03/29/2013 1037   AST 17 02/15/2012 1405   ALT 12 03/29/2013 1037   ALT 12 02/15/2012 1405  BILITOT 0.34 03/29/2013 1037   BILITOT 0.3 02/15/2012 1405       RADIOGRAPHIC STUDIES:  Dg Chest 2 View  09/13/2012  *RADIOLOGY REPORT*  Clinical Data: Chronic shortness of breath, prior smoker, breast cancer  CHEST - 2 VIEW  Comparison: 08/03/2008  Findings: Lower cervical fusion hardware noted.  Right port catheter has been removed.  Postop changes bilaterally with dense overlying breast shadows.  Background hyperinflation noted suspicious for COPD/emphysema.  No focal airspace process, edema, collapse, consolidation, effusion or pneumothorax.  IMPRESSION: Postop changes.  Hyperinflation.  No superimposed acute process   Original Report Authenticated By: Judie Petit. Shick, M.D.     ASSESSMENT: 62 year old female with  #1 history of invasive ductal carcinoma of the left breast originally diagnosed in 2009. Patient initially was treated on NSABP B. 41 protocol she completed 4 cycles of Adriamycin and Cytoxan. She was initiated on oral lapatinib and IV Taxol on 04/10/2008 but she failed to start the oral lapatinib because of bad patient went off study. She went on to complete Herceptin and Taxol. Her course was complicated by development of cardiomyopathy and patient also had to discontinue Herceptin.  #2 on 08/08/2008 patient underwent a bilateral mastectomies with left sentinel lymph node biopsy. Her final pathology revealed on the left side high-grade ductal carcinoma in situ measuring 1.8 cm no evidence of invasive carcinoma. 4 sentinel nodes were  negative for metastatic disease. Right simple mastectomy just revealed extensive fibrocystic changes with microcalcifications no malignancy.  #3 patient then went on to receive letrozole 2.5 mg starting March 2010. She continues on this and overall she is tolerating it well.   PLAN:   #1 continue letrozole 2.5 mg daily.  #2 bone density scan as soon as possible  #3 patient will be seen back in 12 months time for followup.   All questions were answered. The patient knows to call the clinic with any problems, questions or concerns. We can certainly see the patient much sooner if necessary.  I spent 15 minutes counseling the patient face to face. The total time spent in the appointment was 30 minutes.    Drue Second, MD Medical/Oncology [facility] [phone] (beeper) [phone] (Office)  03/29/2013, 11:38 AM

## 2013-04-06 ENCOUNTER — Other Ambulatory Visit: Payer: 59

## 2013-04-11 ENCOUNTER — Ambulatory Visit
Admission: RE | Admit: 2013-04-11 | Discharge: 2013-04-11 | Disposition: A | Discharge status: Home / Self Care | Payer: 59 | Source: Ambulatory Visit | Attending: Oncology | Admitting: Oncology

## 2013-04-11 DIAGNOSIS — M8580 Other specified disorders of bone density and structure, unspecified site: Secondary | ICD-10-CM

## 2013-09-30 ENCOUNTER — Other Ambulatory Visit: Payer: Self-pay | Admitting: Oncology

## 2013-11-17 ENCOUNTER — Telehealth: Payer: Self-pay

## 2013-11-17 NOTE — Telephone Encounter (Signed)
Medication and allergies:  Reviewed and updated  90 day supply/mail order: na Local pharmacy: [facility] [address] [place]   Immunizations due:  shingles  A/P:   No changes to FH, PSH or Personal Hx Flu vaccine--did not get flu vaccine this season Tdap--10/2011 PNA--never had one Shingles--had shingles when she was in her 55's; never had vaccine Pap--01/2012--Dr Lomax--nml MMG--Bilateral Mastectomy--2009 Bone Density--05/2013-- T-1.2   Z 0.4 CCS--2009--negative per patient  To Discuss with Provider: Not at this time

## 2013-11-20 ENCOUNTER — Encounter: Payer: Self-pay | Admitting: Internal Medicine

## 2013-11-20 ENCOUNTER — Ambulatory Visit (INDEPENDENT_AMBULATORY_CARE_PROVIDER_SITE_OTHER): Payer: Medicare Other | Admitting: Internal Medicine

## 2013-11-20 VITALS — BP 110/73 | HR 76 | Temp 98.1°F | Ht 62.0 in | Wt 107.2 lb

## 2013-11-20 DIAGNOSIS — J449 Chronic obstructive pulmonary disease, unspecified: Secondary | ICD-10-CM | POA: Insufficient documentation

## 2013-11-20 DIAGNOSIS — F3289 Other specified depressive episodes: Secondary | ICD-10-CM

## 2013-11-20 DIAGNOSIS — F329 Major depressive disorder, single episode, unspecified: Secondary | ICD-10-CM

## 2013-11-20 DIAGNOSIS — J45909 Unspecified asthma, uncomplicated: Secondary | ICD-10-CM

## 2013-11-20 DIAGNOSIS — M810 Age-related osteoporosis without current pathological fracture: Secondary | ICD-10-CM

## 2013-11-20 DIAGNOSIS — IMO0001 Reserved for inherently not codable concepts without codable children: Secondary | ICD-10-CM

## 2013-11-20 DIAGNOSIS — Z0000 Encounter for general adult medical examination without abnormal findings: Secondary | ICD-10-CM

## 2013-11-20 LAB — COMPREHENSIVE METABOLIC PANEL
ALT: 12 U/L (ref 0–35)
AST: 20 U/L (ref 0–37)
Albumin: 4.3 g/dL (ref 3.5–5.2)
Alkaline Phosphatase: 60 U/L (ref 39–117)
BUN: 16 mg/dL (ref 6–23)
CALCIUM: 9.6 mg/dL (ref 8.4–10.5)
CHLORIDE: 103 meq/L (ref 96–112)
CO2: 32 meq/L (ref 19–32)
CREATININE: 0.8 mg/dL (ref 0.4–1.2)
GFR: 78.05 mL/min (ref >60.00)
Glucose, Bld: 88 mg/dL (ref 70–99)
Potassium: 3.9 mEq/L (ref 3.5–5.1)
Sodium: 140 mEq/L (ref 135–145)
Total Bilirubin: 0.5 mg/dL (ref 0.3–1.2)
Total Protein: 7.3 g/dL (ref 6.0–8.3)

## 2013-11-20 LAB — LIPID PANEL
Cholesterol: 223 mg/dL — ABNORMAL HIGH (ref 0–200)
HDL: 78.8 mg/dL (ref >39.00)
LDL Cholesterol: 124 mg/dL — ABNORMAL HIGH (ref 0–99)
TRIGLYCERIDES: 99 mg/dL (ref 0.0–149.0)
Total CHOL/HDL Ratio: 3
VLDL: 19.8 mg/dL (ref 0.0–40.0)

## 2013-11-20 LAB — VITAMIN B12: Vitamin B-12: 579 pg/mL (ref 211–911)

## 2013-11-20 LAB — FOLATE: Folate: 19.8 ng/mL (ref >5.9)

## 2013-11-20 NOTE — Assessment & Plan Note (Signed)
Per Dr. Herold Harms

## 2013-11-20 NOTE — Progress Notes (Signed)
Subjective:    Patient ID: [name], female    DOB: 11-06-1950, 63 y.o.   MRN: [id number]  DOS:  11/20/2013 Type of  visit: New patient to me, here for a complete physical, transferring from Dr. Linna Darner      ROS Diet-- healthy Exercise-- try to exercise , 1-2 /week  No  CP, SOB No palpitations, no lower extremity edema Denies  nausea, vomiting diarrhea No abdominal pain Denies  blood in the stools (-) cough, sputum production (-) wheezing, chest congestion (-)hemoptysis No dysuria, gross hematuria, difficulty urinating       Past Medical History  Diagnosis Date  . Anemia     B12 deficient  . GERD (gastroesophageal reflux disease)   . Hyperlipidemia   . Breast cancer     left breast.  . Osteoporosis     Dr Lomax---> transferring to a new gyn  . Fibromyalgia   . B12 deficiency   . Migraines   . IBS (irritable bowel syndrome)   . LV dysfunction     iatrogenic from chemotherapy ; on Carvedilol     Past Surgical History  Procedure Laterality Date  . Abdominal hysterectomy      BSO for  Endometriosis  . Carpal tunnel release      left  . Cervical disc surgery    . Septoplasty    . Mastectomy  2009    bilateral, Dr Margot Chimes  . Esophageal dilation  2005    Dr Earlean Shawl  . Colonoscopy w/ polypectomy  1999    Dr Earlean Shawl  . Colonoscopy  2009    negative    History   Social History  . Marital Status: Married    Spouse Name: N/A    Number of Children: 1  . Years of Education: N/A   Occupational History  . retired, Medical sales representative     Social History Main Topics  . Smoking status: Former Smoker    Quit date: 08/31/1990  . Smokeless tobacco: Never Used     Comment: smoked 1971-1992, up to 1 ppd  . Alcohol Use: No  . Drug Use: No  . Sexual Activity: Not Currently   Other Topics Concern  . Not on file   Social History Narrative  . No narrative on file    Family History  Problem Relation Age of Onset  . Diabetes Mother   . Breast cancer Mother   .  Depression Mother     anxiety  . Heart disease Mother     in her 76s  . Colon cancer Sister   . Stroke Neg Hx          Medication List       This list is accurate as of: 11/20/13  1:14 PM.  Always use your most recent med list.               albuterol 108 (90 BASE) MCG/ACT inhaler  Commonly known as:  PROVENTIL HFA;VENTOLIN HFA  Inhale 2 puffs into the lungs every 4 (four) hours as needed.     ALPRAZolam 0.5 MG tablet  Commonly known as:  XANAX  Take 0.5 mg by mouth at bedtime as needed.     cetirizine 10 MG tablet  Commonly known as:  ZYRTEC  Take 10 mg by mouth daily.     citalopram 20 MG tablet  Commonly known as:  CELEXA  Take 20 mg by mouth daily.     cyanocobalamin 500 MCG tablet  Take 1,000  mcg by mouth daily.     letrozole 2.5 MG tablet  Commonly known as:  FEMARA  TAKE 1 TABLET (2.5 MG TOTAL) BY MOUTH DAILY.     LIDODERM 5 %  Generic drug:  lidocaine  Place 1 patch onto the skin daily. Remove & Discard patch within 12 hours or as directed by MD     LINZESS 145 MCG Caps capsule  Generic drug:  Linaclotide  Take 145 mcg by mouth daily.     methocarbamol 500 MG tablet  Commonly known as:  ROBAXIN  Take 500 mg by mouth. 1/2 by mouth two times daily     MULTIVITAMIN PO  Take by mouth daily.     traMADol 50 MG tablet  Commonly known as:  ULTRAM  Take 50 mg by mouth. Maximum dose= 8 tablets per day     vitamin C 1000 MG tablet  Take 1,000 mg by mouth daily.     Vitamin D3 2000 UNITS Tabs  Take 1 tablet by mouth daily. 2 by mouth once daily           Objective:   Physical Exam BP 110/73  Pulse 76  Temp(Src) 98.1 F (36.7 C) (Oral)  Ht 5\' 2"  (1.575 m)  Wt 107 lb 3.2 oz (48.626 kg)  BMI 19.60 kg/m2  SpO2 95% General -- alert, well-developed, NAD.  Neck --no thyromegaly , normal carotid pulse  HEENT-- Not pale.   Lungs -- normal respiratory effort, no intercostal retractions, no accessory muscle use, and normal breath sounds.  Heart--  normal rate, regular rhythm, no murmur.  Abdomen-- Not distended, good bowel sounds,soft, non-tender. No bruit Extremities-- no pretibial edema bilaterally  Neurologic--  alert & oriented X3. Speech normal, gait normal, strength normal in all extremities.  Psych-- Cognition and judgment appear intact. Cooperative with normal attention span and concentration. No anxious or depressed appearing.      Assessment & Plan:

## 2013-11-20 NOTE — Assessment & Plan Note (Signed)
Symptoms well-controlled, on citalopram, gets Xanax prescriptions from gynecology

## 2013-11-20 NOTE — Patient Instructions (Signed)
Get your blood work before you leave   Next visit is for a physical exam in 1 year, fasting Please make an appointment    

## 2013-11-20 NOTE — Assessment & Plan Note (Signed)
Bone Density--05/2013-- T-1.2  Per gyn

## 2013-11-20 NOTE — Progress Notes (Signed)
Pre visit review using our clinic review tool, if applicable. No additional management support is needed unless otherwise documented below in the visit note. 

## 2013-11-20 NOTE — Assessment & Plan Note (Signed)
Former smoker, chest x-ray 08/2012 showed hyperinflation, Dr. Linna Darner has her on Albuterol, hardly ever uses, she is essentially asymptomatic.

## 2013-11-20 NOTE — Assessment & Plan Note (Addendum)
Td 2013 PNM shot --never  Shingles--had shingles when she was in her 22's; never had vaccine, declined one today, benefits discussed female care per gyn, used to see Dr Ubaldo Glassing , Plans to see a new gynecologist Last Cscope 01-2013 Dr Cher Nakai, negative, repeat in 5 years d/t h/o polyps, see report Diet and exercise discussed

## 2013-11-23 ENCOUNTER — Encounter: Payer: Self-pay | Admitting: *Deleted

## 2014-02-22 ENCOUNTER — Telehealth: Payer: Self-pay | Admitting: Internal Medicine

## 2014-02-22 MED ORDER — ALBUTEROL SULFATE HFA 108 (90 BASE) MCG/ACT IN AERS: 2.0000 | INHALATION_SPRAY | RESPIRATORY_TRACT | Status: DC | PRN

## 2014-02-22 NOTE — Telephone Encounter (Signed)
Caller name: Aowyn Relation to [id number] Call back number: [phone] Pharmacy:  Reason for call: to request a refill for albuterol (PROVENTIL HFA;VENTOLIN HFA) 108 (90 BASE) MCG/ACT inhaler

## 2014-02-22 NOTE — Telephone Encounter (Signed)
Done . rx sent  

## 2014-02-22 NOTE — Addendum Note (Signed)
Addended by: Peggyann Shoals on: 02/22/2014 04:54 PM   Modules accepted: Orders

## 2014-03-08 ENCOUNTER — Telehealth: Payer: Self-pay | Admitting: Hematology and Oncology

## 2014-03-08 NOTE — Telephone Encounter (Signed)
, °

## 2014-03-29 ENCOUNTER — Ambulatory Visit: Payer: 59 | Admitting: Oncology

## 2014-03-29 ENCOUNTER — Other Ambulatory Visit: Payer: 59

## 2014-05-29 ENCOUNTER — Telehealth: Payer: Self-pay | Admitting: *Deleted

## 2014-05-29 NOTE — Telephone Encounter (Signed)
Left message for a return phone call to see if patient would like to come in to see Dr. Lindi Adie earlier in the week 11/2.  Awaiting patient response.

## 2014-06-05 ENCOUNTER — Telehealth: Payer: Self-pay | Admitting: *Deleted

## 2014-06-05 NOTE — Telephone Encounter (Signed)
Left 2nd message for a return phone call to reschedule patient for an earlier date.  Awaiting patient response.

## 2014-06-07 ENCOUNTER — Telehealth: Payer: Self-pay | Admitting: *Deleted

## 2014-06-07 NOTE — Telephone Encounter (Signed)
Spoke with patient and confirmed new appointment for 07/10/14 at 10am for labs or 1030 with Dr. Lindi Adie.

## 2014-07-05 ENCOUNTER — Other Ambulatory Visit: Payer: Medicare Other

## 2014-07-05 ENCOUNTER — Ambulatory Visit: Payer: Self-pay | Admitting: Hematology and Oncology

## 2014-07-09 ENCOUNTER — Other Ambulatory Visit: Payer: Self-pay

## 2014-07-09 DIAGNOSIS — Z853 Personal history of malignant neoplasm of breast: Secondary | ICD-10-CM

## 2014-07-10 ENCOUNTER — Other Ambulatory Visit (HOSPITAL_BASED_OUTPATIENT_CLINIC_OR_DEPARTMENT_OTHER): Payer: Medicare Other

## 2014-07-10 ENCOUNTER — Ambulatory Visit (HOSPITAL_BASED_OUTPATIENT_CLINIC_OR_DEPARTMENT_OTHER): Payer: Medicare Other | Admitting: Hematology and Oncology

## 2014-07-10 ENCOUNTER — Encounter: Payer: Self-pay | Admitting: *Deleted

## 2014-07-10 ENCOUNTER — Telehealth: Payer: Self-pay | Admitting: Hematology and Oncology

## 2014-07-10 DIAGNOSIS — Z853 Personal history of malignant neoplasm of breast: Secondary | ICD-10-CM

## 2014-07-10 LAB — COMPREHENSIVE METABOLIC PANEL (CC13)
ALBUMIN: 4 g/dL (ref 3.5–5.0)
ALK PHOS: 59 U/L (ref 40–150)
ALT: 13 U/L (ref 0–55)
ANION GAP: 7 meq/L (ref 3–11)
AST: 22 U/L (ref 5–34)
BILIRUBIN TOTAL: 0.36 mg/dL (ref 0.20–1.20)
BUN: 17.7 mg/dL (ref 7.0–26.0)
CO2: 28 mEq/L (ref 22–29)
Calcium: 9.3 mg/dL (ref 8.4–10.4)
Chloride: 104 mEq/L (ref 98–109)
Creatinine: 1 mg/dL (ref 0.6–1.1)
GLUCOSE: 123 mg/dL (ref 70–140)
POTASSIUM: 3.6 meq/L (ref 3.5–5.1)
Sodium: 140 mEq/L (ref 136–145)
Total Protein: 6.8 g/dL (ref 6.4–8.3)

## 2014-07-10 LAB — CBC WITH DIFFERENTIAL/PLATELET
BASO%: 0.8 % (ref 0.0–2.0)
BASOS ABS: 0 10*3/uL (ref 0.0–0.1)
EOS ABS: 0.1 10*3/uL (ref 0.0–0.5)
EOS%: 1.2 % (ref 0.0–7.0)
HCT: 35.7 % (ref 34.8–46.6)
HEMOGLOBIN: 11.7 g/dL (ref 11.6–15.9)
LYMPH%: 33 % (ref 14.0–49.7)
MCH: 29.5 pg (ref 25.1–34.0)
MCHC: 32.7 g/dL (ref 31.5–36.0)
MCV: 90.4 fL (ref 79.5–101.0)
MONO#: 0.4 10*3/uL (ref 0.1–0.9)
MONO%: 8.5 % (ref 0.0–14.0)
NEUT%: 56.5 % (ref 38.4–76.8)
NEUTROS ABS: 2.8 10*3/uL (ref 1.5–6.5)
PLATELETS: 264 10*3/uL (ref 145–400)
RBC: 3.95 10*6/uL (ref 3.70–5.45)
RDW: 13.3 % (ref 11.2–14.5)
WBC: 4.9 10*3/uL (ref 3.9–10.3)
lymph#: 1.6 10*3/uL (ref 0.9–3.3)

## 2014-07-10 NOTE — Progress Notes (Signed)
Patient Care Team: Colon Branch, MD as PCP - General (Internal Medicine)  DIAGNOSIS: 63 year old female with invasive ductal carcinoma with high-grade ductal carcinoma in situ of the left breast diagnosed April 2009.  PRIOR THERAPY:  #1 patient presented with a digital screening mammogram on 11/24/2007 that showed a possible distortion of the left breast. She went on to have a ultrasound-guided biopsy on 12/05/2007 that showed invasive ductal carcinoma with high-grade DCIS. The tumor was ER positive PR positive at 26% and 11% respectively. HER-2/neu was 3+ and positive with a proliferation index Ki-67 at 12%. patient had MRI of the breasts performed that showed a solitary area of enhancement at the 12:00 position. This measured 2.5 x 2.4 x 1.8 cm. No other abnormalities were seen.  #2 patient was enrolled on NSABP B. 41 clinical study. She received Adriamycin Cytoxan total of 4 cycles from 01/10/2008 2 03/13/2008. She then began Taxol with lapatinib and Herceptin 3 weeks on one week off starting on 8 2009. Patient failed to begin oral lapatinib and she went off study. Therefore she received 11 doses of Taxol and Herceptin.  #3 she then went on to have bilateral mastectomies with reconstruction on 08/08/2008. This was a patient preference. Her final revealed on the left side DCIS involving an area of 1.8 cm there was no invasive disease noted. She had sentinel lymph node biopsy performed that revealed no evidence of malignancy. Right breast showed extensive fibrocystic changes without any evidence of malignancy   #5 patient began letrozole 2.5 mg starting March 2010. Herceptin was on hold due to drop in her ejection fractions. 11/19/2005 her MUGA was 57% and Herceptin was discontinued.  CURRENT THERAPY:letrozole 2.5 mg daily.  CHIEF COMPLIANT: followup of breast cancer  INTERVAL HISTORY: [name] is a 63 year old Caucasian with above-mentioned history of HER-2 positive breast cancer that was  treated with neoadjuvant chemotherapy on NSABP B. 41 clinical trial but she could not continue with the trial because of reaction to lapatinib. She could not tolerate Herceptin either because of decrease in ejection fraction. She had been on oral antiestrogen therapy with letrozole for the past 5 years. She has been tolerating it extremely well without any major problems or concerns. Since she had bilateral mastectomies is no indication for surveillance imaging.  REVIEW OF SYSTEMS:   Constitutional: Denies fevers, chills or abnormal weight loss Eyes: Denies blurriness of vision Ears, nose, mouth, throat, and face: Denies mucositis or sore throat Respiratory: Denies cough, dyspnea or wheezes Cardiovascular: Denies palpitation, chest discomfort or lower extremity swelling Gastrointestinal:  Denies nausea, heartburn or change in bowel habits Skin: Denies abnormal skin rashes Lymphatics: Denies new lymphadenopathy or easy bruising Neurological:Denies numbness, tingling or new weaknesses Behavioral/Psych: Mood is stable, no new changes  Breast:  denies any pain or lumps or nodules in either breasts All other systems were reviewed with the patient and are negative.  I have reviewed the past medical history, past surgical history, social history and family history with the patient and they are unchanged from previous note.  ALLERGIES:  is allergic to amoxicillin-pot clavulanate; doxycycline; and venlafaxine.  MEDICATIONS:  Current Outpatient Prescriptions  Medication Sig Dispense Refill  . albuterol (PROVENTIL HFA;VENTOLIN HFA) 108 (90 BASE) MCG/ACT inhaler Inhale 2 puffs into the lungs every 4 (four) hours as needed. 8.5 g 2  . ALPRAZolam (XANAX) 0.5 MG tablet Take 0.5 mg by mouth at bedtime as needed.    . Ascorbic Acid (VITAMIN C) 1000 MG tablet Take 1,000 mg  by mouth daily.    . cetirizine (ZYRTEC) 10 MG tablet Take 10 mg by mouth daily.    . Cholecalciferol (VITAMIN D3) 2000 UNITS TABS Take  1 tablet by mouth daily. 2 by mouth once daily    . citalopram (CELEXA) 20 MG tablet Take 20 mg by mouth daily.    . cyanocobalamin 500 MCG tablet Take 1,000 mcg by mouth daily.     Marland Kitchen letrozole (FEMARA) 2.5 MG tablet TAKE 1 TABLET (2.5 MG TOTAL) BY MOUTH DAILY. 90 tablet 3  . lidocaine (LIDODERM) 5 % Place 1 patch onto the skin daily. Remove & Discard patch within 12 hours or as directed by MD    . Linaclotide (LINZESS) 145 MCG CAPS capsule Take 145 mcg by mouth daily.    . methocarbamol (ROBAXIN) 500 MG tablet Take 500 mg by mouth. 1/2 by mouth two times daily    . Multiple Vitamins-Minerals (MULTIVITAMIN PO) Take by mouth daily.    . traMADol (ULTRAM) 50 MG tablet Take 50 mg by mouth. Maximum dose= 8 tablets per day      No current facility-administered medications for this visit.    PHYSICAL EXAMINATION: ECOG PERFORMANCE STATUS: 0 - Asymptomatic  Filed Vitals:   07/10/14 1058  BP: 111/67  Pulse: 68  Temp: 98.4 F (36.9 C)  Resp: 18   Filed Weights   07/10/14 1058  Weight: 105 lb 4.8 oz (47.764 kg)    GENERAL:alert, no distress and comfortable SKIN: skin color, texture, turgor are normal, no rashes or significant lesions EYES: normal, Conjunctiva are pink and non-injected, sclera clear OROPHARYNX:no exudate, no erythema and lips, buccal mucosa, and tongue normal  NECK: supple, thyroid normal size, non-tender, without nodularity LYMPH:  no palpable lymphadenopathy in the cervical, axillary or inguinal LUNGS: clear to auscultation and percussion with normal breathing effort HEART: regular rate & rhythm and no murmurs and no lower extremity edema ABDOMEN:abdomen soft, non-tender and normal bowel sounds Musculoskeletal:no cyanosis of digits and no clubbing  NEURO: alert & oriented x 3 with fluent speech, no focal motor/sensory deficits BREAST:no palpable masses in the reconstructed breasts or axilla. No palpable axillary supraclavicular or infraclavicular  adenopathy   LABORATORY DATA:  I have reviewed the data as listed   Chemistry      Component Value Date/Time   NA 140 07/10/2014 1005   NA 140 11/20/2013 1127   K 3.6 07/10/2014 1005   K 3.9 11/20/2013 1127   CL 103 11/20/2013 1127   CL 102 09/27/2012 0936   CO2 28 07/10/2014 1005   CO2 32 11/20/2013 1127   BUN 17.7 07/10/2014 1005   BUN 16 11/20/2013 1127   CREATININE 1.0 07/10/2014 1005   CREATININE 0.8 11/20/2013 1127      Component Value Date/Time   CALCIUM 9.3 07/10/2014 1005   CALCIUM 9.6 11/20/2013 1127   ALKPHOS 59 07/10/2014 1005   ALKPHOS 60 11/20/2013 1127   AST 22 07/10/2014 1005   AST 20 11/20/2013 1127   ALT 13 07/10/2014 1005   ALT 12 11/20/2013 1127   BILITOT 0.36 07/10/2014 1005   BILITOT 0.5 11/20/2013 1127       Lab Results  Component Value Date   WBC 4.9 07/10/2014   HGB 11.7 07/10/2014   HCT 35.7 07/10/2014   MCV 90.4 07/10/2014   PLT 264 07/10/2014   NEUTROABS 2.8 07/10/2014     RADIOGRAPHIC STUDIES: I have personally reviewed the radiology reports and agreed with their findings. No results found.  ASSESSMENT & PLAN:  BREAST CANCER, HX OF Left breast invasive ductal carcinoma ER/PR and HER-2 positive treated with bilateral mastectomies preceded by Neoadjuvant chemotherapy with a.c. x4 followed by Taxol with lapatinib but because of side effects patient was taken off lapatinib ( she was on NSABP B. 41 trial) and then completed Herceptin Taxol but because of cardiomyopathy Herceptin was discontinued.  Patient has been on letrozole since March 2010 and had completed 5 years of therapy. I will obtain breast cancer index BCI testing to assess whether she needs long-term antiestrogen therapy. If there is no benefit to long-term antiestrogen therapy, she can then discontinue treatment. I would like to see her back in one year for followup.     Orders Placed This Encounter  Procedures  . CBC with Differential    Standing Status: Future      Number of Occurrences:      Standing Expiration Date: 07/10/2015  . Comprehensive metabolic panel (Cmet) - [facility]    Standing Status: Future     Number of Occurrences:      Standing Expiration Date: 07/10/2015   The patient has a good understanding of the overall plan. she agrees with it. She will call with any problems that may develop before her next visit here.  I spent 15 minutes counseling the patient face to face. The total time spent in the appointment was 15 minutes and more than 50% was on counseling and review of test results    Rulon Eisenmenger, MD 07/10/2014 12:51 PM

## 2014-07-10 NOTE — Progress Notes (Signed)
Ordered Breast Cancer Index per Dr. Lindi Adie order.  Faxed requisition to [employer].

## 2014-07-10 NOTE — Assessment & Plan Note (Signed)
>>  ASSESSMENT AND PLAN FOR BREAST CANCER, HX OF WRITTEN ON 07/10/2014 11:44 AM BY GUDENA, Zadie Rhine, MD  Left breast invasive ductal carcinoma ER/PR and HER-2 positive treated with bilateral mastectomies preceded by Neoadjuvant chemotherapy with a.c. x4 followed by Taxol with lapatinib but because of side effects patient was taken off lapatinib ( she was on NSABP B. 41 trial) and then completed Herceptin Taxol but because of cardiomyopathy Herceptin was discontinued.  Patient has been on letrozole since March 2010 and had completed 5 years of therapy. I will obtain breast cancer index BCI testing to assess whether she needs long-term antiestrogen therapy. If there is no benefit to long-term antiestrogen therapy, she can then discontinue treatment. I would like to see her back in one year for followup.

## 2014-07-10 NOTE — Telephone Encounter (Signed)
per pof to sch tpa appt-gave pt copy of sch

## 2014-07-10 NOTE — Assessment & Plan Note (Signed)
Left breast invasive ductal carcinoma ER/PR and HER-2 positive treated with bilateral mastectomies preceded by Neoadjuvant chemotherapy with a.c. x4 followed by Taxol with lapatinib but because of side effects patient was taken off lapatinib ( she was on NSABP B. 41 trial) and then completed Herceptin Taxol but because of cardiomyopathy Herceptin was discontinued.  Patient has been on letrozole since March 2010 and had completed 5 years of therapy. I will obtain breast cancer index BCI testing to assess whether she needs long-term antiestrogen therapy. If there is no benefit to long-term antiestrogen therapy, she can then discontinue treatment. I would like to see her back in one year for followup.

## 2014-07-18 ENCOUNTER — Encounter (HOSPITAL_COMMUNITY): Payer: Self-pay

## 2014-09-14 ENCOUNTER — Telehealth: Payer: Self-pay | Admitting: Hematology and Oncology

## 2014-09-14 ENCOUNTER — Other Ambulatory Visit: Payer: Self-pay | Admitting: Hematology and Oncology

## 2014-09-14 NOTE — Telephone Encounter (Signed)
, °

## 2014-09-15 ENCOUNTER — Other Ambulatory Visit: Payer: Self-pay | Admitting: Internal Medicine

## 2014-10-02 ENCOUNTER — Ambulatory Visit (INDEPENDENT_AMBULATORY_CARE_PROVIDER_SITE_OTHER): Payer: Commercial Managed Care - HMO | Admitting: Internal Medicine

## 2014-10-02 ENCOUNTER — Encounter: Payer: Self-pay | Admitting: Internal Medicine

## 2014-10-02 VITALS — BP 104/66 | HR 89 | Temp 98.4°F | Ht 62.0 in | Wt 102.1 lb

## 2014-10-02 DIAGNOSIS — D518 Other vitamin B12 deficiency anemias: Secondary | ICD-10-CM

## 2014-10-02 DIAGNOSIS — F418 Other specified anxiety disorders: Secondary | ICD-10-CM | POA: Diagnosis not present

## 2014-10-02 DIAGNOSIS — E559 Vitamin D deficiency, unspecified: Secondary | ICD-10-CM

## 2014-10-02 DIAGNOSIS — J4520 Mild intermittent asthma, uncomplicated: Secondary | ICD-10-CM

## 2014-10-02 DIAGNOSIS — M797 Fibromyalgia: Secondary | ICD-10-CM | POA: Diagnosis not present

## 2014-10-02 DIAGNOSIS — F419 Anxiety disorder, unspecified: Secondary | ICD-10-CM

## 2014-10-02 DIAGNOSIS — Z853 Personal history of malignant neoplasm of breast: Secondary | ICD-10-CM

## 2014-10-02 DIAGNOSIS — Z0000 Encounter for general adult medical examination without abnormal findings: Secondary | ICD-10-CM

## 2014-10-02 DIAGNOSIS — F329 Major depressive disorder, single episode, unspecified: Secondary | ICD-10-CM

## 2014-10-02 MED ORDER — ALBUTEROL SULFATE HFA 108 (90 BASE) MCG/ACT IN AERS: INHALATION_SPRAY | RESPIRATORY_TRACT | Status: DC

## 2014-10-02 MED ORDER — CITALOPRAM HYDROBROMIDE 20 MG PO TABS: 20.0000 mg | ORAL_TABLET | Freq: Every day | ORAL | Status: DC

## 2014-10-02 NOTE — Assessment & Plan Note (Signed)
Follow-up by hematology, was recommended to discontinue Femara

## 2014-10-02 NOTE — Assessment & Plan Note (Signed)
Needs a referral to see Dr.Davenshwar

## 2014-10-02 NOTE — Assessment & Plan Note (Signed)
Refill Ventolin which she uses very rarely

## 2014-10-02 NOTE — Assessment & Plan Note (Signed)
>>  ASSESSMENT AND PLAN FOR BREAST CANCER, HX OF WRITTEN ON 10/02/2014 12:06 PM BY PAZ, JOSE E, MD   Follow-up by hematology, was recommended to discontinue Femara

## 2014-10-02 NOTE — Assessment & Plan Note (Signed)
Symptoms well-controlled, refill citaloopram

## 2014-10-02 NOTE — Assessment & Plan Note (Signed)
Needs a referral to [facility] gynecology which will be arranged. We discussed 3 immunizations today: Declined

## 2014-10-02 NOTE — Patient Instructions (Signed)
Continue taking the same medications  Take over-the-counter vitamin B12 and vitamin D supplements daily  Come back in 4-5 months, fasting for a physical exam

## 2014-10-02 NOTE — Progress Notes (Signed)
Subjective:    Patient ID: [name], female    DOB: 12/31/1950, 64 y.o.   MRN: [id number]  DOS:  10/02/2014 Type of visit - description : rov Interval history: -reactive airway disease, request a refill of Ventolin which she hardly ever uses -depression, symptom well contolled on citalpram, request a  Refill -due to insurance constraints, needs a referral to rheumatology and gynecology  - saw   Oncology, was recommended to stop Femara which she did. - we did discuss immunizations, flu shot, pneumonia shot, Zostavax: Declined  ROS  denies chest pain or difficulty beathing No nausea vomtin, diarrhea. No blood in the stools  Past Medical History  Diagnosis Date  . Anemia     B12 deficient  . GERD (gastroesophageal reflux disease)   . Hyperlipidemia   . Breast cancer     left breast.  . Osteoporosis     Dr Lomax---> transferring to a new gyn  . Fibromyalgia   . B12 deficiency   . Migraines   . IBS (irritable bowel syndrome)   . LV dysfunction     iatrogenic from chemotherapy ; on Carvedilol     Past Surgical History  Procedure Laterality Date  . Abdominal hysterectomy      BSO for  Endometriosis  . Carpal tunnel release      left  . Cervical disc surgery    . Septoplasty    . Mastectomy  2009    bilateral, Dr Margot Chimes  . Esophageal dilation  2005    Dr Earlean Shawl  . Colonoscopy w/ polypectomy  1999    Dr Earlean Shawl  . Colonoscopy  2009    negative    History   Social History  . Marital Status: Married    Spouse Name: N/A    Number of Children: 1  . Years of Education: N/A   Occupational History  . retired, Medical sales representative     Social History Main Topics  . Smoking status: Former Smoker    Quit date: 08/31/1990  . Smokeless tobacco: Never Used     Comment: smoked 1971-1992, up to 1 ppd  . Alcohol Use: No  . Drug Use: No  . Sexual Activity: Not Currently   Other Topics Concern  . Not on file   Social History Narrative        Medication List         This list is accurate as of: 10/02/14  7:22 PM.  Always use your most recent med list.               albuterol 108 (90 BASE) MCG/ACT inhaler  Commonly known as:  VENTOLIN HFA  INHALE 2 PUFFS INTO THE LUNGS EVERY 4 (FOUR) HOURS AS NEEDED.     ALPRAZolam 0.5 MG tablet  Commonly known as:  XANAX  Take 0.5 mg by mouth at bedtime as needed.     cetirizine 10 MG tablet  Commonly known as:  ZYRTEC  Take 10 mg by mouth daily.     citalopram 20 MG tablet  Commonly known as:  CELEXA  Take 1 tablet (20 mg total) by mouth daily.     cyanocobalamin 500 MCG tablet  Take 1,000 mcg by mouth daily.     LIDODERM 5 %  Generic drug:  lidocaine  Place 1 patch onto the skin daily. Remove & Discard patch within 12 hours or as directed by MD     LINZESS 145 MCG Caps capsule  Generic drug:  Linaclotide  Take 145 mcg by mouth daily.     methocarbamol 500 MG tablet  Commonly known as:  ROBAXIN  Take 500 mg by mouth. 1/2 by mouth two times daily     MULTIVITAMIN PO  Take by mouth daily.     traMADol 50 MG tablet  Commonly known as:  ULTRAM  Take 50 mg by mouth. Maximum dose= 8 tablets per day     Vitamin D3 2000 UNITS Tabs  Take 1 tablet by mouth daily. 2 by mouth once daily           Objective:   Physical Exam  Constitutional: She is oriented to person, place, and time. She appears well-developed. No distress.  HENT:  Head: Normocephalic and atraumatic.  Cardiovascular:  RRR, no murmur, rub or gallop  Pulmonary/Chest: Effort normal. No respiratory distress.  CTA B  Musculoskeletal: She exhibits no edema or tenderness.  Neurological: She is alert and oriented to person, place, and time. No cranial nerve deficit. She exhibits normal muscle tone. Coordination normal.  Speech normal, gait unassisted and normal for age, motor strength appropriate for age   Skin: Skin is warm and dry. No pallor.  No jaundice  Psychiatric: She has a normal mood and affect. Her behavior is normal.  Judgment and thought content normal.  Vitals reviewed.        Assessment & Plan:

## 2014-10-02 NOTE — Assessment & Plan Note (Signed)
On OTC vitamin D

## 2014-10-02 NOTE — Progress Notes (Signed)
Pre visit review using our clinic review tool, if applicable. No additional management support is needed unless otherwise documented below in the visit note. 

## 2014-10-02 NOTE — Assessment & Plan Note (Signed)
Encourage to continue taking OTC supplements which she stopped

## 2014-10-12 ENCOUNTER — Telehealth: Payer: Self-pay | Admitting: Internal Medicine

## 2014-10-12 NOTE — Telephone Encounter (Signed)
Caller name: Glema Relation to pt: self Call back number: [phone] Pharmacy: [facility] on [address]  Reason for call:   Patient requesting xanax refill.

## 2014-10-12 NOTE — Telephone Encounter (Signed)
LMOM informing Pt to return call.  

## 2014-10-12 NOTE — Telephone Encounter (Signed)
rec patient to call gynecology,   they have been prescribing that medication.

## 2014-10-12 NOTE — Telephone Encounter (Signed)
Pt is requesting refill on Alprazolam. Did not see where you have ever filled. Please advise.   Last OV: 10/02/2014

## 2014-12-26 ENCOUNTER — Other Ambulatory Visit: Payer: Self-pay

## 2014-12-26 ENCOUNTER — Telehealth: Payer: Self-pay | Admitting: Internal Medicine

## 2014-12-26 NOTE — Telephone Encounter (Signed)
Pre visit letter for annual exam mailed

## 2015-01-16 ENCOUNTER — Encounter: Payer: Commercial Managed Care - HMO | Admitting: Internal Medicine

## 2015-01-23 ENCOUNTER — Ambulatory Visit: Payer: Medicare HMO | Admitting: Medical

## 2015-01-31 ENCOUNTER — Ambulatory Visit: Payer: Medicare HMO | Admitting: Medical

## 2015-02-01 ENCOUNTER — Ambulatory Visit: Payer: Medicare HMO | Admitting: Internal Medicine

## 2015-02-08 ENCOUNTER — Ambulatory Visit: Payer: Medicare HMO | Admitting: Internal Medicine

## 2015-02-15 ENCOUNTER — Encounter: Payer: Self-pay | Admitting: Internal Medicine

## 2015-02-15 ENCOUNTER — Ambulatory Visit (INDEPENDENT_AMBULATORY_CARE_PROVIDER_SITE_OTHER): Payer: Medicare HMO | Admitting: Internal Medicine

## 2015-02-15 VITALS — BP 102/68 | HR 96 | Temp 98.2°F | Ht 62.0 in | Wt 100.4 lb

## 2015-02-15 DIAGNOSIS — F419 Anxiety disorder, unspecified: Principal | ICD-10-CM

## 2015-02-15 DIAGNOSIS — M8580 Other specified disorders of bone density and structure, unspecified site: Secondary | ICD-10-CM | POA: Diagnosis not present

## 2015-02-15 DIAGNOSIS — K5900 Constipation, unspecified: Secondary | ICD-10-CM | POA: Insufficient documentation

## 2015-02-15 DIAGNOSIS — F418 Other specified anxiety disorders: Secondary | ICD-10-CM | POA: Diagnosis not present

## 2015-02-15 DIAGNOSIS — F329 Major depressive disorder, single episode, unspecified: Secondary | ICD-10-CM

## 2015-02-15 NOTE — Progress Notes (Signed)
Pre visit review using our clinic review tool, if applicable. No additional management support is needed unless otherwise documented below in the visit note. 

## 2015-02-15 NOTE — Progress Notes (Signed)
Subjective:    Patient ID: [name], female    DOB: June 21, 1951, 64 y.o.   MRN: [id number]  DOS:  02/15/2015 Type of visit - description : acute Interval history: -Patient states that her daughter schedule the appointment for her, she had a "meltdown" and the daughter thinks she needs some counseling. Patient is willing to do that. -constipation, needs a referral to see Dr. Earlean Shawl Osteopenia, refill Florestine Avers? Has not been taking it.  Review of Systems  Good compliance of medication including citalopram Denies any suicidal ideas   Past Medical History  Diagnosis Date  . Anemia     B12 deficient  . GERD (gastroesophageal reflux disease)   . Hyperlipidemia   . Breast cancer     left breast.  . Osteoporosis     Dr Lomax---> transferring to a new gyn  . Fibromyalgia     Dr. Estanislado Pandy  . B12 deficiency   . Migraines   . IBS (irritable bowel syndrome)   . LV dysfunction     iatrogenic from chemotherapy ; on Carvedilol   . Greater trochanteric bursitis of both hips     Dr. Estanislado Pandy  . Chronic SI joint pain     Bilateral, Dr. Estanislado Pandy    Past Surgical History  Procedure Laterality Date  . Abdominal hysterectomy      BSO for  Endometriosis  . Carpal tunnel release      left  . Cervical disc surgery    . Septoplasty    . Mastectomy  2009    bilateral, Dr Margot Chimes  . Esophageal dilation  2005    Dr Earlean Shawl  . Colonoscopy w/ polypectomy  1999    Dr Earlean Shawl  . Colonoscopy  2009    negative    History   Social History  . Marital Status: Married    Spouse Name: N/A  . Number of Children: 1  . Years of Education: N/A   Occupational History  . retired, Medical sales representative     Social History Main Topics  . Smoking status: Former Smoker    Quit date: 08/31/1990  . Smokeless tobacco: Never Used     Comment: smoked 1971-1992, up to 1 ppd  . Alcohol Use: No  . Drug Use: No  . Sexual Activity: Not Currently   Other Topics Concern  . Not on file   Social History  Narrative        Medication List       This list is accurate as of: 02/15/15 11:59 PM.  Always use your most recent med list.               albuterol 108 (90 BASE) MCG/ACT inhaler  Commonly known as:  VENTOLIN HFA  INHALE 2 PUFFS INTO THE LUNGS EVERY 4 (FOUR) HOURS AS NEEDED.     ALPRAZolam 0.5 MG tablet  Commonly known as:  XANAX  Take 0.5 mg by mouth at bedtime as needed.     cetirizine 10 MG tablet  Commonly known as:  ZYRTEC  Take 10 mg by mouth daily.     citalopram 20 MG tablet  Commonly known as:  CELEXA  Take 1 tablet (20 mg total) by mouth daily.     cyanocobalamin 500 MCG tablet  Take 1,000 mcg by mouth daily.     cyclobenzaprine 10 MG tablet  Commonly known as:  FLEXERIL  Take 1 tablet (10 mg total) by mouth at bedtime.     LIDODERM 5 %  Generic  drug:  lidocaine  Place 1 patch onto the skin daily. Remove & Discard patch within 12 hours or as directed by MD     LINZESS 145 MCG Caps capsule  Generic drug:  Linaclotide  Take 145 mcg by mouth daily.     methocarbamol 500 MG tablet  Commonly known as:  ROBAXIN  Take 500 mg by mouth. 1/2 by mouth two times daily     MULTIVITAMIN PO  Take by mouth daily.     traMADol 50 MG tablet  Commonly known as:  ULTRAM  Take 50 mg by mouth. Maximum dose= 8 tablets per day     Vitamin D3 2000 UNITS Tabs  Take 1 tablet by mouth daily. 2 by mouth once daily           Objective:   Physical Exam BP 102/68 mmHg  Pulse 96  Temp(Src) 98.2 F (36.8 C) (Oral)  Ht 5\' 2"  (1.575 m)  Wt 100 lb 6 oz (45.53 kg)  BMI 18.35 kg/m2  SpO2 95% General:   Well developed, well nourished . NAD.  HEENT:  Normocephalic . Face symmetric, atraumatic Neurologic:  alert & oriented X3.  Speech normal, gait appropriate for age and unassisted Psych--  Cognition and judgment appear intact.  Cooperative with normal attention span and concentration.  Behavior appropriate. No anxious or depressed appearing.       Assessment  & Plan:

## 2015-02-15 NOTE — Assessment & Plan Note (Addendum)
History of osteopenia. T score of -1.2 back in October 2014. Used to be managed by Mrs Andres Labrum  She is intolerant to Fosamax and Actonel but she is able to take Kulm. She has not been taking that in a while and likes a refill. My recommendation is as follows: Continue calcium and vitamin D don't RF Atelvia, check a bone density test (october 2016 ) and prescribed medications if necessary

## 2015-02-15 NOTE — Assessment & Plan Note (Signed)
Reports chronic constipation, Dr. Earlean Shawl is prescribing Linzess,  request a referral: Will be arrange

## 2015-02-15 NOTE — Assessment & Plan Note (Signed)
Apparently she has been more anxious and depressed lately, she really did not like to discuss any issues with me rather likes to see a counselor. Declined to adjust her medications. Plan: Patient about our local counselors provided. Encouraged to call me if needed

## 2015-02-15 NOTE — Patient Instructions (Signed)
Please see a counselor Continue with calcium and vitamin D

## 2015-04-17 ENCOUNTER — Ambulatory Visit (INDEPENDENT_AMBULATORY_CARE_PROVIDER_SITE_OTHER): Payer: Commercial Managed Care - HMO | Admitting: Internal Medicine

## 2015-04-17 ENCOUNTER — Encounter: Payer: Self-pay | Admitting: Internal Medicine

## 2015-04-17 VITALS — BP 116/78 | HR 91 | Temp 97.6°F | Ht 62.0 in | Wt 94.4 lb

## 2015-04-17 DIAGNOSIS — F419 Anxiety disorder, unspecified: Principal | ICD-10-CM

## 2015-04-17 DIAGNOSIS — K5900 Constipation, unspecified: Secondary | ICD-10-CM | POA: Diagnosis not present

## 2015-04-17 DIAGNOSIS — F418 Other specified anxiety disorders: Secondary | ICD-10-CM

## 2015-04-17 DIAGNOSIS — M8580 Other specified disorders of bone density and structure, unspecified site: Secondary | ICD-10-CM | POA: Diagnosis not present

## 2015-04-17 DIAGNOSIS — F32A Depression, unspecified: Secondary | ICD-10-CM

## 2015-04-17 DIAGNOSIS — F329 Major depressive disorder, single episode, unspecified: Secondary | ICD-10-CM

## 2015-04-17 DIAGNOSIS — J4520 Mild intermittent asthma, uncomplicated: Secondary | ICD-10-CM | POA: Diagnosis not present

## 2015-04-17 MED ORDER — CITALOPRAM HYDROBROMIDE 20 MG PO TABS: 30.0000 mg | ORAL_TABLET | Freq: Every day | ORAL | Status: DC

## 2015-04-17 MED ORDER — LINACLOTIDE 145 MCG PO CAPS: 145.0000 ug | ORAL_CAPSULE | Freq: Every day | ORAL | Status: DC

## 2015-04-17 NOTE — Assessment & Plan Note (Addendum)
Since the last visit did not seek counseling. Today she reports that she would like to up her citalopram to 40 mg. Also, she gets Xanax from time to time for difficulty sleeping. Plan: Increase citalopram to 30 mg. She has been taking Ultram and citalopram without problems for many years, nevertheless I recommend her to watch for serotonin syndrome type of symptoms. Watch for suicidal ideas.

## 2015-04-17 NOTE — Progress Notes (Signed)
Subjective:    Patient ID: [name], female    DOB: 1950-11-15, 64 y.o.   MRN: [id number]  DOS:  04/17/2015 Type of visit - description : Routine office visit Interval history: Depression: Continue with symptoms, request increase Celexa dosage. Constipation: Unable to see her GI doctor due to insurance constraints, request a refill on linzess  Osteopenia: Due for bone density test  request a disability parking note, sometimes gets short of breath in a very hot day when she is carrying her groceries.   Review of Systems Denies chest pain or palpitations No nausea, vomiting. No blood in the stools. No suicidal ideas.    Past Medical History  Diagnosis Date  . Anemia     B12 deficient  . GERD (gastroesophageal reflux disease)   . Hyperlipidemia   . Breast cancer     left breast.  . Osteoporosis     Dr Lomax---> transferring to a new gyn  . Fibromyalgia     Dr. Estanislado Pandy  . B12 deficiency   . Migraines   . IBS (irritable bowel syndrome)   . LV dysfunction     iatrogenic from chemotherapy ; on Carvedilol   . Greater trochanteric bursitis of both hips     Dr. Estanislado Pandy  . Chronic SI joint pain     Bilateral, Dr. Estanislado Pandy    Past Surgical History  Procedure Laterality Date  . Abdominal hysterectomy      BSO for  Endometriosis  . Carpal tunnel release      left  . Cervical disc surgery    . Septoplasty    . Mastectomy  2009    bilateral, Dr Margot Chimes  . Esophageal dilation  2005    Dr Earlean Shawl  . Colonoscopy w/ polypectomy  1999    Dr Earlean Shawl  . Colonoscopy  2009    negative    Social History   Social History  . Marital Status: Married    Spouse Name: N/A  . Number of Children: 1  . Years of Education: N/A   Occupational History  . retired, Medical sales representative     Social History Main Topics  . Smoking status: Former Smoker    Quit date: 08/31/1990  . Smokeless tobacco: Never Used     Comment: smoked 1971-1992, up to 1 ppd  . Alcohol Use: No  . Drug Use:  No  . Sexual Activity: Not Currently   Other Topics Concern  . Not on file   Social History Narrative        Medication List       This list is accurate as of: 04/17/15 11:59 PM.  Always use your most recent med list.               albuterol 108 (90 BASE) MCG/ACT inhaler  Commonly known as:  VENTOLIN HFA  INHALE 2 PUFFS INTO THE LUNGS EVERY 4 (FOUR) HOURS AS NEEDED.     ALPRAZolam 0.5 MG tablet  Commonly known as:  XANAX  Take 0.5 mg by mouth at bedtime as needed.     cetirizine 10 MG tablet  Commonly known as:  ZYRTEC  Take 10 mg by mouth daily.     citalopram 20 MG tablet  Commonly known as:  CELEXA  Take 1.5 tablets (30 mg total) by mouth daily.     cyanocobalamin 500 MCG tablet  Take 1,000 mcg by mouth daily.     LIDODERM 5 %  Generic drug:  lidocaine  Place 1  patch onto the skin daily. Remove & Discard patch within 12 hours or as directed by MD     Linaclotide 145 MCG Caps capsule  Commonly known as:  LINZESS  Take 1 capsule (145 mcg total) by mouth daily.     methocarbamol 500 MG tablet  Commonly known as:  ROBAXIN  Take 250 mg by mouth 2 (two) times daily. Dr. Estanislado Pandy     MULTIVITAMIN PO  Take by mouth daily.     traMADol 50 MG tablet  Commonly known as:  ULTRAM  Take 50 mg by mouth. Maximum dose= 8 tablets per day     Vitamin D3 2000 UNITS Tabs  Take 1 tablet by mouth daily. 2 by mouth once daily           Objective:   Physical Exam BP 116/78 mmHg  Pulse 91  Temp(Src) 97.6 F (36.4 C) (Oral)  Ht 5\' 2"  (1.575 m)  Wt 94 lb 6 oz (42.808 kg)  BMI 17.26 kg/m2  SpO2 97% General:   Well developed, well nourished . NAD.  HEENT:  Normocephalic . Face symmetric, atraumatic Neck: No thyromegaly Lungs:  CTA B Normal respiratory effort, no intercostal retractions, no accessory muscle use. Heart: RRR,  no murmur.  No pretibial edema bilaterally  Skin: Not pale. Not jaundice Neurologic:  alert & oriented X3.  Speech normal, gait  appropriate for age and unassisted Psych--  Cognition and judgment appear intact.  Cooperative with normal attention span and concentration.  Behavior appropriate. No anxious or depressed appearing.      Assessment & Plan:

## 2015-04-17 NOTE — Patient Instructions (Signed)
Serotonin Syndrome Serotonin is a brain chemical that regulates the nervous system. Some kinds of drugs increase the amount of serotonin in your body. Drugs that increase the serotonin in your body include:   Anti-depressant medications.  St. John's wort.  Recreational drugs.  Migraine medicines.  Some pain medicines. SYMPTOMS Combining these drugs increases the risk that you will become ill with a toxic condition called serotonin syndrome.  Symptoms of too much serotonin include:  Confusion.  Agitation.  Weakness.  Insomnia.  Fever.  Sweats. Other symptoms that may develop include:  Shakiness.  Muscle spasms.  Seizures. TREATMENT  Hospital treatment is often needed until the effects are controlled.  Avoiding the combination of medicines listed above is recommended.  Check with your doctor if you are concerned about your medicine or the side effects. Document Released: 09/24/2004 Document Revised: 11/09/2011 Document Reviewed: 08/17/2005 [facility] Patient Information 2015 [place], [place]. This information is not intended to replace advice given to you by your health care provider. Make sure you discuss any questions you have with your health care provider.

## 2015-04-17 NOTE — Assessment & Plan Note (Addendum)
Unable to see Dr. Cher Nakai due to insurance constraints, has been on linzess  for years, works well for her, request a refill --- done

## 2015-04-17 NOTE — Assessment & Plan Note (Signed)
Occasionally get short of breath when she carries groceries, provide parking permit.

## 2015-04-17 NOTE — Assessment & Plan Note (Signed)
Schedule a bone density test 

## 2015-04-17 NOTE — Progress Notes (Signed)
Pre visit review using our clinic review tool, if applicable. No additional management support is needed unless otherwise documented below in the visit note. 

## 2015-06-21 ENCOUNTER — Telehealth: Payer: Self-pay | Admitting: Internal Medicine

## 2015-06-21 DIAGNOSIS — M797 Fibromyalgia: Secondary | ICD-10-CM

## 2015-06-21 NOTE — Telephone Encounter (Signed)
Referral placed.

## 2015-06-21 NOTE — Telephone Encounter (Signed)
Called pt, she says that she went to [place] and seen Dr. Estanislado Pandy .

## 2015-06-21 NOTE — Telephone Encounter (Signed)
Caller name: Denielle Bayard  Relationship to patient: Self   Can be reached: [phone]  Reason for call: Pt called in requesting a referral to an orthopedic. Pt says that she went to the appointment on Monday. But she need the referral in for her insurance.    Please Advise.

## 2015-06-21 NOTE — Telephone Encounter (Signed)
Who was she seen by and for what? I will need to know which MD saw her and diagnosis to place referral. Thank you.

## 2015-07-12 ENCOUNTER — Other Ambulatory Visit: Payer: Self-pay

## 2015-07-12 ENCOUNTER — Telehealth: Payer: Self-pay

## 2015-07-12 MED ORDER — CITALOPRAM HYDROBROMIDE 20 MG PO TABS: 30.0000 mg | ORAL_TABLET | Freq: Every day | ORAL | Status: DC

## 2015-07-12 NOTE — Telephone Encounter (Signed)
-----   Message from Colon Branch, MD sent at 07/11/2015  7:24 PM EST ----- [name], you are due for a bone density test, we have not been able to schedule it. Please call and schedule the bone density test at your earliest convenience.

## 2015-07-12 NOTE — Telephone Encounter (Signed)
Letter printed and mailed to Pt.  

## 2015-07-18 ENCOUNTER — Ambulatory Visit: Payer: Medicare Other | Admitting: Hematology and Oncology

## 2015-07-18 ENCOUNTER — Other Ambulatory Visit: Payer: Medicare Other

## 2015-09-04 ENCOUNTER — Encounter: Payer: Commercial Managed Care - HMO | Admitting: Internal Medicine

## 2015-10-08 ENCOUNTER — Telehealth: Payer: Self-pay | Admitting: Internal Medicine

## 2015-10-08 NOTE — Telephone Encounter (Signed)
lvm inquiring if patient received flu shot  °

## 2015-11-08 DIAGNOSIS — L705 Acne excoriee des jeunes filles: Secondary | ICD-10-CM | POA: Diagnosis not present

## 2015-11-08 DIAGNOSIS — L0100 Impetigo, unspecified: Secondary | ICD-10-CM | POA: Diagnosis not present

## 2015-11-20 ENCOUNTER — Telehealth: Payer: Self-pay

## 2015-11-20 NOTE — Telephone Encounter (Signed)
Called patient to complete pre-visit information. Left message for return call.

## 2015-11-21 ENCOUNTER — Encounter: Payer: Self-pay | Admitting: Internal Medicine

## 2015-11-21 ENCOUNTER — Telehealth: Payer: Self-pay

## 2015-11-21 ENCOUNTER — Ambulatory Visit (INDEPENDENT_AMBULATORY_CARE_PROVIDER_SITE_OTHER): Payer: Medicare Other | Admitting: Internal Medicine

## 2015-11-21 VITALS — BP 108/74 | HR 75 | Temp 98.5°F | Ht 62.0 in | Wt 92.2 lb

## 2015-11-21 DIAGNOSIS — Z0000 Encounter for general adult medical examination without abnormal findings: Secondary | ICD-10-CM

## 2015-11-21 DIAGNOSIS — E785 Hyperlipidemia, unspecified: Secondary | ICD-10-CM

## 2015-11-21 DIAGNOSIS — D518 Other vitamin B12 deficiency anemias: Secondary | ICD-10-CM | POA: Diagnosis not present

## 2015-11-21 DIAGNOSIS — Z09 Encounter for follow-up examination after completed treatment for conditions other than malignant neoplasm: Secondary | ICD-10-CM | POA: Insufficient documentation

## 2015-11-21 DIAGNOSIS — D649 Anemia, unspecified: Secondary | ICD-10-CM | POA: Diagnosis not present

## 2015-11-21 DIAGNOSIS — E538 Deficiency of other specified B group vitamins: Secondary | ICD-10-CM | POA: Diagnosis not present

## 2015-11-21 DIAGNOSIS — M810 Age-related osteoporosis without current pathological fracture: Secondary | ICD-10-CM

## 2015-11-21 DIAGNOSIS — E782 Mixed hyperlipidemia: Secondary | ICD-10-CM

## 2015-11-21 LAB — CBC WITH DIFFERENTIAL/PLATELET
BASOS PCT: 0.6 % (ref 0.0–3.0)
Basophils Absolute: 0 10*3/uL (ref 0.0–0.1)
EOS PCT: 1.4 % (ref 0.0–5.0)
Eosinophils Absolute: 0.1 10*3/uL (ref 0.0–0.7)
HCT: 34.9 % — ABNORMAL LOW (ref 36.0–46.0)
Hemoglobin: 11.7 g/dL — ABNORMAL LOW (ref 12.0–15.0)
LYMPHS ABS: 1.5 10*3/uL (ref 0.7–4.0)
Lymphocytes Relative: 34.7 % (ref 12.0–46.0)
MCHC: 33.5 g/dL (ref 30.0–36.0)
MCV: 89.8 fl (ref 78.0–100.0)
MONOS PCT: 9.2 % (ref 3.0–12.0)
Monocytes Absolute: 0.4 10*3/uL (ref 0.1–1.0)
NEUTROS PCT: 54.1 % (ref 43.0–77.0)
Neutro Abs: 2.4 10*3/uL (ref 1.4–7.7)
Platelets: 276 10*3/uL (ref 150.0–400.0)
RBC: 3.88 Mil/uL (ref 3.87–5.11)
RDW: 12.7 % (ref 11.5–15.5)
WBC: 4.4 10*3/uL (ref 4.0–10.5)

## 2015-11-21 LAB — LIPID PANEL
Cholesterol: 205 mg/dL — ABNORMAL HIGH (ref 0–200)
HDL: 77.7 mg/dL (ref >39.00)
LDL CALC: 110 mg/dL — AB (ref 0–99)
NONHDL: 126.95
Total CHOL/HDL Ratio: 3
Triglycerides: 85 mg/dL (ref 0.0–149.0)
VLDL: 17 mg/dL (ref 0.0–40.0)

## 2015-11-21 NOTE — Telephone Encounter (Signed)
Orders placed.

## 2015-11-21 NOTE — Progress Notes (Signed)
Subjective:    Patient ID: [name], female    DOB: April 11, 1951, 65 y.o.   MRN: [id number]  DOS:  11/21/2015 Type of visit - description : CPX Interval history: In general feeling well., No major concerns.   Review of Systems  Constitutional: No fever. No chills. No unexplained wt changes. No unusual sweats  HEENT: No dental problems, no ear discharge, no facial swelling, no voice changes. No eye discharge, no eye  redness , no  intolerance to light   Respiratory: No wheezing , no  difficulty breathing. Occasionally has cough , no mucus production  Cardiovascular: No CP, no leg swelling , no  Palpitations  GI: no nausea, no vomiting, no diarrhea , no  abdominal pain.  No blood in the stools. No dysphagia, no odynophagia    Endocrine: No polyphagia, no polyuria , no polydipsia  GU: No dysuria, gross hematuria, difficulty urinating. No urinary urgency, no frequency.  Musculoskeletal: No joint swellings or unusual aches or pains  Skin: No change in the color of the skin, palor , no  Rash  Allergic, immunologic: No environmental allergies , no  food allergies  Neurological: No dizziness no  syncope. No headaches. No diplopia, no slurred, no slurred speech, no motor deficits, no facial  Numbness  Hematological: No enlarged lymph nodes, no easy bruising , no unusual bleedings  Psychiatry: No suicidal ideas, no hallucinations, no beavior problems, no confusion.  No unusual/severe anxiety, no depression  Past Medical History  Diagnosis Date  . Anemia     B12 deficient  . GERD (gastroesophageal reflux disease)   . Hyperlipidemia   . Breast cancer ([place])     left breast.  . Osteoporosis     Dr Lomax---> transferring to a new gyn  . Fibromyalgia     Dr. Estanislado Pandy  . B12 deficiency   . Migraines   . IBS (irritable bowel syndrome)   . LV dysfunction     iatrogenic from chemotherapy ; on Carvedilol   . Greater trochanteric bursitis of both hips     Dr. Estanislado Pandy    . Chronic SI joint pain     Bilateral, Dr. Estanislado Pandy    Past Surgical History  Procedure Laterality Date  . Abdominal hysterectomy      BSO for  Endometriosis  . Carpal tunnel release      left  . Cervical disc surgery    . Septoplasty    . Mastectomy  2009    bilateral, Dr Margot Chimes  . Esophageal dilation  2005    Dr Earlean Shawl  . Colonoscopy w/ polypectomy  1999    Dr Earlean Shawl  . Colonoscopy  2009    negative    Social History   Social History  . Marital Status: Married    Spouse Name: N/A  . Number of Children: 1  . Years of Education: N/A   Occupational History  . retired, Medical sales representative     Social History Main Topics  . Smoking status: Former Smoker    Quit date: 08/31/1990  . Smokeless tobacco: Never Used     Comment: smoked 1971-1992, up to 1 ppd  . Alcohol Use: No  . Drug Use: No  . Sexual Activity: Not Currently   Other Topics Concern  . Not on file   Social History Narrative   Lives w/ husband     Family History  Problem Relation Age of Onset  . Diabetes Mother   . Breast cancer Mother   .  Depression Mother     anxiety  . Heart disease Mother     in her 48s  . Colon cancer Sister   . Stroke Neg Hx       Medication List       This list is accurate as of: 11/21/15  3:15 PM.  Always use your most recent med list.               albuterol 108 (90 Base) MCG/ACT inhaler  Commonly known as:  VENTOLIN HFA  INHALE 2 PUFFS INTO THE LUNGS EVERY 4 (FOUR) HOURS AS NEEDED.     ALPRAZolam 0.5 MG tablet  Commonly known as:  XANAX  Take 0.5 mg by mouth at bedtime as needed. Reported on 11/21/2015     CALCIUM PO  Take 1 tablet by mouth daily.     cetirizine 10 MG tablet  Commonly known as:  ZYRTEC  Take 10 mg by mouth daily.     citalopram 20 MG tablet  Commonly known as:  CELEXA  Take 1.5 tablets (30 mg total) by mouth daily.     cyanocobalamin 500 MCG tablet  Take 1,000 mcg by mouth daily.     LIDODERM 5 %  Generic drug:  lidocaine  Place 1  patch onto the skin daily. Remove & Discard patch within 12 hours or as directed by MD     Linaclotide 145 MCG Caps capsule  Commonly known as:  LINZESS  Take 1 capsule (145 mcg total) by mouth daily.     methocarbamol 500 MG tablet  Commonly known as:  ROBAXIN  Take 250 mg by mouth 2 (two) times daily. Dr. Estanislado Pandy     MULTIVITAMIN PO  Take by mouth daily.     PROBIOTIC PO  Take 1 tablet by mouth daily.     traMADol 50 MG tablet  Commonly known as:  ULTRAM  Take 50 mg by mouth. Maximum dose= 4 tablets per day     Vitamin D3 2000 units Tabs  2 by mouth once daily           Objective:   Physical Exam BP 108/74 mmHg  Pulse 75  Temp(Src) 98.5 F (36.9 C) (Oral)  Ht 5\' 2"  (1.575 m)  Wt 92 lb 4 oz (41.844 kg)  BMI 16.87 kg/m2  SpO2 96% General:   Well developed, well nourished . NAD.  HEENT:  Normocephalic . Face symmetric, atraumatic Lungs:  CTA B Normal respiratory effort, no intercostal retractions, no accessory muscle use. Heart: RRR,  no murmur.  no pretibial edema bilaterally  Abdomen:  Not distended, soft, non-tender. No rebound or rigidity.  Skin: Not pale. Not jaundice Neurologic:  alert & oriented X3.  Speech normal, gait appropriate for age and unassisted Psych--  Cognition and judgment appear intact.  Cooperative with normal attention span and concentration.  Behavior appropriate. No anxious or depressed appearing.    Assessment & Plan:   Assessment Hyperlipidemia B12 deficiency anemia  Anxiety, depression, insomnia GI: --GERD, esophageal stricture -- IBS, Dr Cher Nakai MSK: tramadol per Dr Herold Harms --Fibromyalgia --chronic neck and  SI joint pain --DJD Reactive airway disease, former smoker. Parking permit  [id number] Migraines  Osteoporosis:  last Tscore -2.7 (2014).previopusly f/u gyn/cancer center  H/o into to fosamax-actonel, ok w/ Atelvia Vitamin D deficiency Breast cancer, left, bilateral mastectomy 2009, no XRT, chemo d/c d/t  heart issues , s/p letrozol x 5 years  h/o   LV dysfunction from chemotherapy ---> resolved   Plan: Hyperlipidemia:  Check a FLP B12 deficiency: Check a B12, CBC. depression and insomnia: Well controlled with citalopram. MSK: Sees Dr. Herold Harms, they are doing blood work, specifically CBC, CMP and vitamin D last year. Osteoporosis-- last Tscore -2.7 (2014). H/o into to fosamax-actonel, ok w/ Atelvia. Currently on Ca and Vit D Plan: DEXA RTC one year

## 2015-11-21 NOTE — Assessment & Plan Note (Signed)
Hyperlipidemia: Check a FLP B12 deficiency: Check a B12, CBC. depression and insomnia: Well controlled with citalopram. MSK: Sees Dr. Herold Harms, they are doing blood work, specifically CBC, CMP and vitamin D last year. Osteoporosis-- last Tscore -2.7 (2014). H/o into to fosamax-actonel, ok w/ Atelvia. Currently on Ca and Vit D Plan: DEXA RTC one year

## 2015-11-21 NOTE — Assessment & Plan Note (Addendum)
Td 2013 PNM shot , zostavx-- benefits discussed, declined    Cervical cancer screening: not indicated, s/p hysterectomy for benign reasons  Last Cscope 01-2013 Dr Cher Nakai, negative, repeat in 5 years d/t h/o polyps, see report Diet and exercise discussed

## 2015-11-21 NOTE — Telephone Encounter (Signed)
-----   Message from Colon Branch, MD sent at 11/21/2015  3:21 PM EDT ----- Regarding: dexa Please enter a order for a bone density test at the  breast center in [place].  I forgot to tell the patient, let her know

## 2015-11-21 NOTE — Progress Notes (Signed)
Pre visit review using our clinic review tool, if applicable. No additional management support is needed unless otherwise documented below in the visit note. 

## 2015-11-21 NOTE — Patient Instructions (Signed)
Get your blood work before you leave    Please consider visit these websites for more information about a healthcare power of attorney:  www.begintheconversation.org  theconversationproject.org   Next visit one year     Fall Prevention and Home Safety Falls cause injuries and can affect all age groups. It is possible to use preventive measures to significantly decrease the likelihood of falls. There are many simple measures which can make your home safer and prevent falls. OUTDOORS  Repair cracks and edges of walkways and driveways.  Remove high doorway thresholds.  Trim shrubbery on the main path into your home.  Have good outside lighting.  Clear walkways of tools, rocks, debris, and clutter.  Check that handrails are not broken and are securely fastened. Both sides of steps should have handrails.  Have leaves, snow, and ice cleared regularly.  Use sand or salt on walkways during winter months.  In the garage, clean up grease or oil spills. BATHROOM  Install night lights.  Install grab bars by the toilet and in the tub and shower.  Use non-skid mats or decals in the tub or shower.  Place a plastic non-slip stool in the shower to sit on, if needed.  Keep floors dry and clean up all water on the floor immediately.  Remove soap buildup in the tub or shower on a regular basis.  Secure bath mats with non-slip, double-sided rug tape.  Remove throw rugs and tripping hazards from the floors. BEDROOMS  Install night lights.  Make sure a bedside light is easy to reach.  Do not use oversized bedding.  Keep a telephone by your bedside.  Have a firm chair with side arms to use for getting dressed.  Remove throw rugs and tripping hazards from the floor. KITCHEN  Keep handles on pots and pans turned toward the center of the stove. Use back burners when possible.  Clean up spills quickly and allow time for drying.  Avoid walking on wet floors.  Avoid hot  utensils and knives.  Position shelves so they are not too high or low.  Place commonly used objects within easy reach.  If necessary, use a sturdy step stool with a grab bar when reaching.  Keep electrical cables out of the way.  Do not use floor polish or wax that makes floors slippery. If you must use wax, use non-skid floor wax.  Remove throw rugs and tripping hazards from the floor. STAIRWAYS  Never leave objects on stairs.  Place handrails on both sides of stairways and use them. Fix any loose handrails. Make sure handrails on both sides of the stairways are as long as the stairs.  Check carpeting to make sure it is firmly attached along stairs. Make repairs to worn or loose carpet promptly.  Avoid placing throw rugs at the top or bottom of stairways, or properly secure the rug with carpet tape to prevent slippage. Get rid of throw rugs, if possible.  Have an electrician put in a light switch at the top and bottom of the stairs. OTHER FALL PREVENTION TIPS  Wear low-heel or rubber-soled shoes that are supportive and fit well. Wear closed toe shoes.  When using a stepladder, make sure it is fully opened and both spreaders are firmly locked. Do not climb a closed stepladder.  Add color or contrast paint or tape to grab bars and handrails in your home. Place contrasting color strips on first and last steps.  Learn and use mobility aids as needed. Install  an electrical emergency response system.  Turn on lights to avoid dark areas. Replace light bulbs that burn out immediately. Get light switches that glow.  Arrange furniture to create clear pathways. Keep furniture in the same place.  Firmly attach carpet with non-skid or double-sided tape.  Eliminate uneven floor surfaces.  Select a carpet pattern that does not visually hide the edge of steps.  Be aware of all pets. OTHER HOME SAFETY TIPS  Set the water temperature for 120 F (48.8 C).  Keep emergency numbers on  or near the telephone.  Keep smoke detectors on every level of the home and near sleeping areas. Document Released: 08/07/2002 Document Revised: 02/16/2012 Document Reviewed: 11/06/2011 [facility] Patient Information 2015 [place], [place]. This information is not intended to replace advice given to you by your health care provider. Make sure you discuss any questions you have with your health care provider.   Preventive Care for Adults Ages 36 and over  Blood pressure check.** / Every 1 to 2 years.  Lipid and cholesterol check.**/ Every 5 years beginning at age 73.  Lung cancer screening. / Every year if you are aged 7-80 years and have a 30-pack-year history of smoking and currently smoke or have quit within the past 15 years. Yearly screening is stopped once you have quit smoking for at least 15 years or develop a health problem that would prevent you from having lung cancer treatment.  Fecal occult blood test (FOBT) of stool. / Every year beginning at age 11 and continuing until age 48. You may not have to do this test if you get a colonoscopy every 10 years.  Flexible sigmoidoscopy** or colonoscopy.** / Every 5 years for a flexible sigmoidoscopy or every 10 years for a colonoscopy beginning at age 50 and continuing until age 36.  Hepatitis C blood test.** / For all people born from [birth year] through 1966 and any individual with known risks for hepatitis C.  Abdominal aortic aneurysm (AAA) screening.** / A one-time screening for ages 70 to 84 years who are current or former smokers.  Skin self-exam. / Monthly.  Influenza vaccine. / Every year.  Tetanus, diphtheria, and acellular pertussis (Tdap/Td) vaccine.** / 1 dose of Td every 10 years.  Varicella vaccine.** / Consult your health care provider.  Zoster vaccine.** / 1 dose for adults aged 26 years or older.  Pneumococcal 13-valent conjugate (PCV13) vaccine.** / Consult your health care provider.  Pneumococcal polysaccharide (PPSV23)  vaccine.** / 1 dose for all adults aged 45 years and older.  Meningococcal vaccine.** / Consult your health care provider.  Hepatitis A vaccine.** / Consult your health care provider.  Hepatitis B vaccine.** / Consult your health care provider.  Haemophilus influenzae type b (Hib) vaccine.** / Consult your health care provider. **Family history and personal history of risk and conditions may change your health care provider's recommendations. Document Released: 10/13/2001 Document Revised: 08/22/2013 Document Reviewed: 01/12/2011 [facility] Patient Information 2015 [place], [place]. This information is not intended to replace advice given to you by your health care provider. Make sure you discuss any questions you have with your health care provider.

## 2015-11-22 LAB — VITAMIN B12: Vitamin B-12: 892 pg/mL (ref 211–911)

## 2015-12-16 DIAGNOSIS — Z09 Encounter for follow-up examination after completed treatment for conditions other than malignant neoplasm: Secondary | ICD-10-CM | POA: Diagnosis not present

## 2015-12-16 DIAGNOSIS — M5137 Other intervertebral disc degeneration, lumbosacral region: Secondary | ICD-10-CM | POA: Diagnosis not present

## 2015-12-16 DIAGNOSIS — M19241 Secondary osteoarthritis, right hand: Secondary | ICD-10-CM | POA: Diagnosis not present

## 2015-12-16 DIAGNOSIS — M797 Fibromyalgia: Secondary | ICD-10-CM | POA: Diagnosis not present

## 2015-12-19 DIAGNOSIS — Z036 Encounter for observation for suspected toxic effect from ingested substance ruled out: Secondary | ICD-10-CM | POA: Diagnosis not present

## 2015-12-19 DIAGNOSIS — Z5181 Encounter for therapeutic drug level monitoring: Secondary | ICD-10-CM | POA: Diagnosis not present

## 2015-12-24 ENCOUNTER — Other Ambulatory Visit: Payer: Self-pay | Admitting: Internal Medicine

## 2015-12-24 NOTE — Telephone Encounter (Signed)
Refill sent per [facility] refill protocol/SLS  

## 2016-03-26 ENCOUNTER — Other Ambulatory Visit: Payer: Self-pay | Admitting: Internal Medicine

## 2016-04-06 DIAGNOSIS — L309 Dermatitis, unspecified: Secondary | ICD-10-CM | POA: Diagnosis not present

## 2016-05-29 DIAGNOSIS — M542 Cervicalgia: Secondary | ICD-10-CM | POA: Diagnosis not present

## 2016-05-29 DIAGNOSIS — M797 Fibromyalgia: Secondary | ICD-10-CM | POA: Diagnosis not present

## 2016-05-29 DIAGNOSIS — G4709 Other insomnia: Secondary | ICD-10-CM | POA: Diagnosis not present

## 2016-05-29 DIAGNOSIS — R5381 Other malaise: Secondary | ICD-10-CM | POA: Diagnosis not present

## 2016-07-08 ENCOUNTER — Other Ambulatory Visit: Payer: Self-pay | Admitting: Internal Medicine

## 2016-09-07 ENCOUNTER — Telehealth: Payer: Self-pay | Admitting: Internal Medicine

## 2016-09-07 NOTE — Telephone Encounter (Signed)
lvm advising patient to schedule medicare wellness appointment.  °

## 2016-10-01 ENCOUNTER — Telehealth: Payer: Self-pay | Admitting: Internal Medicine

## 2016-10-01 NOTE — Telephone Encounter (Signed)
Called patient to schedule awv appt. Left msg to schedule appt.

## 2016-11-02 ENCOUNTER — Telehealth: Payer: Self-pay | Admitting: Pharmacist

## 2016-11-02 NOTE — Telephone Encounter (Signed)
Received letter from patient's insurance regarding use of tramadol and citalopram.  Concurrent use of tramadol and selective serotonin reuptake inhibitors may increase the risk of seizures and serotonin syndrome (hypertension, hyperthermia, myoclonus, mental status changes).    Letter was reviewed by Mr. Carlyon Shadow (see attachment) who advised to "warn patient of increased risk of serotonin syndrome with use of tramadol and citalopram.  Best to stop or decrease tramadol to minimize serotonin syndrome risk."  I called patient to review this information with her.  I had to leave a message asking for her to call me back.    Elisabeth Most, Pharm.D., BCPS, CPP Clinical Pharmacist Pager: [phone] Phone: [phone] 11/02/2016 1:46 PM

## 2016-11-03 NOTE — Telephone Encounter (Signed)
Second attempt to reach patient.  Left a message for patient asking her to return my call.

## 2016-11-06 NOTE — Telephone Encounter (Signed)
Third attempt to reach patient.  Left a message on her machine asking for her to call us back.  Note patient is scheduled for follow up on 11/24/2016.  Will discuss with patient at follow up visit.

## 2016-11-13 ENCOUNTER — Other Ambulatory Visit: Payer: Self-pay | Admitting: Rheumatology

## 2016-11-13 NOTE — Telephone Encounter (Signed)
Last Visit: 05/29/16 Next Visit: 11/24/16 UDS: 12/20/15 Narc Agreement: 12/10/15  Okay to refill Tramadol and Methocarbamol?

## 2016-11-14 ENCOUNTER — Other Ambulatory Visit: Payer: Self-pay | Admitting: Rheumatology

## 2016-11-16 ENCOUNTER — Telehealth: Payer: Self-pay | Admitting: Rheumatology

## 2016-11-16 NOTE — Telephone Encounter (Signed)
Message sen in error.

## 2016-11-23 DIAGNOSIS — C50919 Malignant neoplasm of unspecified site of unspecified female breast: Secondary | ICD-10-CM | POA: Insufficient documentation

## 2016-11-23 DIAGNOSIS — Z8719 Personal history of other diseases of the digestive system: Secondary | ICD-10-CM | POA: Insufficient documentation

## 2016-11-23 DIAGNOSIS — G4709 Other insomnia: Secondary | ICD-10-CM | POA: Insufficient documentation

## 2016-11-23 DIAGNOSIS — M5030 Other cervical disc degeneration, unspecified cervical region: Secondary | ICD-10-CM | POA: Insufficient documentation

## 2016-11-23 DIAGNOSIS — M62838 Other muscle spasm: Secondary | ICD-10-CM | POA: Insufficient documentation

## 2016-11-23 DIAGNOSIS — M5136 Other intervertebral disc degeneration, lumbar region: Secondary | ICD-10-CM | POA: Insufficient documentation

## 2016-11-23 DIAGNOSIS — M797 Fibromyalgia: Secondary | ICD-10-CM | POA: Insufficient documentation

## 2016-11-23 DIAGNOSIS — M4126 Other idiopathic scoliosis, lumbar region: Secondary | ICD-10-CM | POA: Insufficient documentation

## 2016-11-23 NOTE — Progress Notes (Signed)
Office Visit Note  Patient: [name]             Date of Birth: 18-Jul-1951           MRN: [id number]             PCP: Kathlene November, MD Referring: Colon Branch, MD Visit Date: 11/24/2016 Occupation: @GUAROCC @    Subjective:  Follow-up   History of Present Illness: KASUMI DITULLIO is a 66 y.o. female  History of fibromyalgia. Currently struggling with discomfort as the temperatures and pressures change due to the recent frequent highs and lows of the weather. Using tramadol for pain and methocarbamol as a muscle relaxer with fairly good results.    Activities of Daily Living:  Patient reports morning stiffness for 30 minutes.   Patient Reports nocturnal pain.  Difficulty dressing/grooming: Reports Difficulty climbing stairs: Reports Difficulty getting out of chair: Reports Difficulty using hands for taps, buttons, cutlery, and/or writing: Reports   No Rheumatology ROS completed.   PMFS History:  Patient Active Problem List   Diagnosis Date Noted  . Fibromyalgia syndrome 11/23/2016  . Other insomnia 11/23/2016  . Other fatigue 11/23/2016  . Trapezius muscle spasm 11/23/2016  . DDD (degenerative disc disease), cervical 11/23/2016  . DDD (degenerative disc disease), lumbar 11/23/2016  . Other idiopathic scoliosis, lumbar region 11/23/2016  . Malignant neoplasm of female breast ([place]) 11/23/2016  . History of gastroesophageal reflux (GERD) 11/23/2016  . PCP NOTES >>>>>>>>>>>>>>>>>>>> 11/21/2015  . Constipation 02/15/2015  . Annual physical exam 11/20/2013  . Reactive airway disease ? (Parking permit) [id number]/4332  . Unspecified adverse effect of unspecified drug, medicinal and biological substance 12/10/2011  . Vitamin D deficiency 07/06/2011  . CARDIOMYOPATHY, PRIMARY, DILATED 01/01/2009  . BREAST CANCER, HX OF 11/02/2008  . HYPERLIPIDEMIA 10/18/2007  . GERD 10/18/2007  . ANEMIA, B12 DEFICIENCY 10/12/2007  . Anxiety,depression, insomnia 10/12/2007  . IBS  10/12/2007  . Primary osteoarthritis of both hands 10/12/2007  . Osteoporosis 10/12/2007  . COLONIC POLYPS, HX OF 10/12/2007  . MIGRAINES, HX OF 10/12/2007  . Fibromyalgia--Dr. Herold Harms, on Ultram 07/14/2007    Past Medical History:  Diagnosis Date  . Anemia    B12 deficient  . B12 deficiency   . Breast cancer ([place])    left breast.  . Chronic SI joint pain    Bilateral, Dr. Estanislado Pandy  . Fibromyalgia    Dr. Estanislado Pandy  . GERD (gastroesophageal reflux disease)   . Greater trochanteric bursitis of both hips    Dr. Estanislado Pandy  . Hyperlipidemia   . IBS (irritable bowel syndrome)   . LV dysfunction    iatrogenic from chemotherapy ; on Carvedilol   . Migraines   . Osteoporosis    Dr Lomax---> transferring to a new gyn    Family History  Problem Relation Age of Onset  . Diabetes Mother   . Breast cancer Mother   . Depression Mother     anxiety  . Heart disease Mother     in her 27s  . Colon cancer Sister   . Stroke Neg Hx    Past Surgical History:  Procedure Laterality Date  . ABDOMINAL HYSTERECTOMY     BSO for  Endometriosis  . CARPAL TUNNEL RELEASE     left  . CERVICAL DISC SURGERY    . COLONOSCOPY  2009   negative  . COLONOSCOPY W/ POLYPECTOMY  1999   Dr Earlean Shawl  . ESOPHAGEAL DILATION  2005   Dr Earlean Shawl  .  MASTECTOMY  2009   bilateral, Dr Margot Chimes  . SEPTOPLASTY     Social History   Social History Narrative   Lives w/ husband     Objective: Vital Signs: BP 90/60   Pulse (!) 58   Resp 14   Ht 5' (1.524 m)   Wt 96 lb (43.5 kg)   BMI 18.75 kg/m    Physical Exam   Musculoskeletal Exam:  Full range of motion of all joints Grip strength is equal and strong bilaterally Fiber myalgia tender points are 18 out of 18 positive with bilateral trapezius muscles hurting more so than other places  CDAI Exam: CDAI Homunculus Exam:   Joint Counts:  CDAI Tender Joint count: 0 CDAI Swollen Joint count: 0  Global Assessments:  Patient Global Assessment:  7 Provider Global Assessment: 7  CDAI Calculated Score: 14    Investigation: Findings:   Labs from April 2017 show the urine drug screen is positive for tramadol.  We prescribed this for the patient and she has been compliant with medications.   No visits with results within 6 Month(s) from this visit.  Latest known visit with results is:  Office Visit on 11/21/2015  Component Date Value Ref Range Status  . Cholesterol 11/21/2015 205* 0 - 200 mg/dL Final  . Triglycerides 11/21/2015 85.0  0.0 - 149.0 mg/dL Final  . HDL 11/21/2015 77.70  >39.00 mg/dL Final  . VLDL 11/21/2015 17.0  0.0 - 40.0 mg/dL Final  . LDL Cholesterol 11/21/2015 110* 0 - 99 mg/dL Final  . Total CHOL/HDL Ratio 11/21/2015 3   Final  . NonHDL 11/21/2015 126.95   Final  . WBC 11/21/2015 4.4  4.0 - 10.5 K/uL Final  . RBC 11/21/2015 3.88  3.87 - 5.11 Mil/uL Final  . Hemoglobin 11/21/2015 11.7* 12.0 - 15.0 g/dL Final  . HCT 11/21/2015 34.9* 36.0 - 46.0 % Final  . MCV 11/21/2015 89.8  78.0 - 100.0 fl Final  . MCHC 11/21/2015 33.5  30.0 - 36.0 g/dL Final  . RDW 11/21/2015 12.7  11.5 - 15.5 % Final  . Platelets 11/21/2015 276.0  150.0 - 400.0 K/uL Final  . Neutrophils Relative % 11/21/2015 54.1  43.0 - 77.0 % Final  . Lymphocytes Relative 11/21/2015 34.7  12.0 - 46.0 % Final  . Monocytes Relative 11/21/2015 9.2  3.0 - 12.0 % Final  . Eosinophils Relative 11/21/2015 1.4  0.0 - 5.0 % Final  . Basophils Relative 11/21/2015 0.6  0.0 - 3.0 % Final  . Neutro Abs 11/21/2015 2.4  1.4 - 7.7 K/uL Final  . Lymphs Abs 11/21/2015 1.5  0.7 - 4.0 K/uL Final  . Monocytes Absolute 11/21/2015 0.4  0.1 - 1.0 K/uL Final  . Eosinophils Absolute 11/21/2015 0.1  0.0 - 0.7 K/uL Final  . Basophils Absolute 11/21/2015 0.0  0.0 - 0.1 K/uL Final  . Vitamin B-12 11/21/2015 892  211 - 911 pg/mL Final      Imaging: No results found.  Speciality Comments: No specialty comments available.    Procedures:  Trigger Point Inj Date/Time:  11/24/2016 10:29 AM Performed by: Eliezer Lofts Authorized by: Eliezer Lofts   Consent Given by:  Patient Site marked: the procedure site was marked   Timeout: prior to procedure the correct patient, procedure, and site was verified   Indications:  Muscle spasm and pain Total # of Trigger Points:  2 Location: neck   Needle Size:  27 G Approach:  Dorsal Medications #1:  0.3 mL lidocaine 1 %;  10 mg triamcinolone acetonide 40 MG/ML Medications #2:  0.3 mL lidocaine 1 %; 10 mg triamcinolone acetonide 40 MG/ML Patient tolerance:  Patient tolerated the procedure well with no immediate complications Comments: Bilateral trapezius muscles have been painful for the last 1 month. Patient is requesting cortisone injection. Both trapezius muscles were injected with 0.3 miles a 1% lidocaine mixed with 10 mg of Kenalog. Patient tolerated procedure well. There is no complications.   Allergies: Amoxicillin-pot clavulanate; Doxycycline; and Venlafaxine   Assessment / Plan:     Visit Diagnoses: Fibromyalgia syndrome - Plan: CBC with Differential/Platelet, COMPLETE METABOLIC PANEL WITH GFR, Pain Mgmt, Profile 5 w/Conf, U  Other insomnia  Other fatigue  Trapezius muscle spasm - bilateral03/27/2018 10 mg Kenalog with 0.3 miles lidocaine to both trapezius muscle  DDD (degenerative disc disease), cervical  DDD (degenerative disc disease), lumbar  Other idiopathic scoliosis, lumbar region  Primary osteoarthritis of both hands  Malignant neoplasm of female breast, unspecified estrogen receptor status, unspecified laterality, unspecified site of breast ([place]) - Status post  History of gastroesophageal reflux (GERD)   Plan: #1: Fibromyalgia syndrome. 18 out of 18 tender points. Active disease with generalized pain.  #2: High-risk prescription. Taking tramadol and we'll do the urine drug screen and updated narcotic agreement today. Using Robaxin on a regular basis with good relief most  days.  #3: Bilateral trapezius muscle spasms. Injected with 10 mg of Kenalog mixed with 0.3 mg a 1% lidocaine. Patient tolerated procedure well. No complications.  #4: History of getting her bone density checked through PCP, Dr. Larose Kells   Orders: Orders Placed This Encounter  Procedures  . Trigger Point Injection  . CBC with Differential/Platelet  . COMPLETE METABOLIC PANEL WITH GFR  . Pain Mgmt, Profile 5 w/Conf, U   No orders of the defined types were placed in this encounter.   Face-to-face time spent with patient was 30 minutes. 50% of time was spent in counseling and coordination of care.  Follow-Up Instructions: Return in about 6 months (around 05/27/2017) for FMS, [place], trap muscle spams.   Eliezer Lofts, PA-C  Note - This record has been created using [employer].  Chart creation errors have been sought, but may not always  have been located. Such creation errors do not reflect on  the standard of medical care.

## 2016-11-24 ENCOUNTER — Ambulatory Visit (INDEPENDENT_AMBULATORY_CARE_PROVIDER_SITE_OTHER): Payer: Medicare Other | Admitting: Rheumatology

## 2016-11-24 ENCOUNTER — Encounter: Payer: Self-pay | Admitting: Rheumatology

## 2016-11-24 VITALS — BP 90/60 | HR 58 | Resp 14 | Ht 60.0 in | Wt 96.0 lb

## 2016-11-24 DIAGNOSIS — C50919 Malignant neoplasm of unspecified site of unspecified female breast: Secondary | ICD-10-CM

## 2016-11-24 DIAGNOSIS — M4126 Other idiopathic scoliosis, lumbar region: Secondary | ICD-10-CM

## 2016-11-24 DIAGNOSIS — G4709 Other insomnia: Secondary | ICD-10-CM

## 2016-11-24 DIAGNOSIS — M19042 Primary osteoarthritis, left hand: Secondary | ICD-10-CM | POA: Diagnosis not present

## 2016-11-24 DIAGNOSIS — M19041 Primary osteoarthritis, right hand: Secondary | ICD-10-CM | POA: Diagnosis not present

## 2016-11-24 DIAGNOSIS — M797 Fibromyalgia: Secondary | ICD-10-CM

## 2016-11-24 DIAGNOSIS — M5030 Other cervical disc degeneration, unspecified cervical region: Secondary | ICD-10-CM | POA: Diagnosis not present

## 2016-11-24 DIAGNOSIS — R5383 Other fatigue: Secondary | ICD-10-CM

## 2016-11-24 DIAGNOSIS — M5136 Other intervertebral disc degeneration, lumbar region: Secondary | ICD-10-CM | POA: Diagnosis not present

## 2016-11-24 DIAGNOSIS — Z8719 Personal history of other diseases of the digestive system: Secondary | ICD-10-CM | POA: Diagnosis not present

## 2016-11-24 DIAGNOSIS — M62838 Other muscle spasm: Secondary | ICD-10-CM | POA: Diagnosis not present

## 2016-11-24 LAB — CBC WITH DIFFERENTIAL/PLATELET
BASOS ABS: 42 {cells}/uL (ref 0–200)
Basophils Relative: 1 %
EOS ABS: 84 {cells}/uL (ref 15–500)
Eosinophils Relative: 2 %
HEMATOCRIT: 36.2 % (ref 35.0–45.0)
HEMOGLOBIN: 11.6 g/dL — AB (ref 11.7–15.5)
LYMPHS ABS: 1596 {cells}/uL (ref 850–3900)
Lymphocytes Relative: 38 %
MCH: 29.7 pg (ref 27.0–33.0)
MCHC: 32 g/dL (ref 32.0–36.0)
MCV: 92.8 fL (ref 80.0–100.0)
MPV: 8.6 fL (ref 7.5–12.5)
Monocytes Absolute: 546 cells/uL (ref 200–950)
Monocytes Relative: 13 %
NEUTROS ABS: 1932 {cells}/uL (ref 1500–7800)
NEUTROS PCT: 46 %
Platelets: 287 10*3/uL (ref 140–400)
RBC: 3.9 MIL/uL (ref 3.80–5.10)
RDW: 13.7 % (ref 11.0–15.0)
WBC: 4.2 10*3/uL (ref 3.8–10.8)

## 2016-11-24 MED ORDER — TRIAMCINOLONE ACETONIDE 40 MG/ML IJ SUSP
10.0000 mg | INTRAMUSCULAR | Status: AC | PRN
  Administered 2016-11-24: 10 mg via INTRAMUSCULAR

## 2016-11-24 MED ORDER — LIDOCAINE HCL 1 % IJ SOLN
0.3000 mL | INTRAMUSCULAR | Status: AC | PRN
  Administered 2016-11-24: .3 mL

## 2016-11-24 NOTE — Progress Notes (Signed)
We previously received a letter from patient's insurance regarding use of tramadol and citalopram.  Concurrent use of tramadol and selective serotonin reuptake inhibitors may increase the risk of seizures and serotonin syndrome (hypertension, hyperthermia, myoclonus, mental status changes).  Letter was reviewed by Mr. Carlyon Shadow (see medica) who advised to "warn patient of increased risk of serotonin syndrome with use of tramadol and citalopram.  Best to stop or decrease tramadol to minimize serotonin syndrome risk."  I tried to call patient to discuss with her, but I was unable to reach patient.    I spoke to patient during visit today.  She confirms she takes citalopram 30 mg daily and tramadol up to 100 mg BID as needed.  I reviewed the risk of seizures and serotonin syndrome with patient.  Patient confirms she has been on both for an extended period of time and denies any adverse effects.  I advised patient to only use tramadol as needed and to try to minimize use.  Also noted patient is prescribed methocarbamol.  Reviewed the purpose, proper use, and adverse effects of methocarbamol including increased risk of sedation and falls/fractures.  Patient voiced understanding and denies any adverse effects of methocarbamol.  Again advised patient to only use methocarbamol as needed.  Patient denies any questions or concerns regarding her medications at this time.    Elisabeth Most, Pharm.D., BCPS, CPP Clinical Pharmacist Pager: [phone] Phone: [phone] 11/24/2016 10:25 AM

## 2016-11-25 LAB — COMPLETE METABOLIC PANEL WITH GFR
ALBUMIN: 4.2 g/dL (ref 3.6–5.1)
ALK PHOS: 59 U/L (ref 33–130)
ALT: 14 U/L (ref 6–29)
AST: 25 U/L (ref 10–35)
BILIRUBIN TOTAL: 0.3 mg/dL (ref 0.2–1.2)
BUN: 17 mg/dL (ref 7–25)
CALCIUM: 9.5 mg/dL (ref 8.6–10.4)
CO2: 27 mmol/L (ref 20–31)
Chloride: 102 mmol/L (ref 98–110)
Creat: 0.87 mg/dL (ref 0.50–0.99)
GFR, EST AFRICAN AMERICAN: 80 mL/min (ref >=60)
GFR, EST NON AFRICAN AMERICAN: 70 mL/min (ref >=60)
Glucose, Bld: 85 mg/dL (ref 65–99)
POTASSIUM: 4.3 mmol/L (ref 3.5–5.3)
Sodium: 138 mmol/L (ref 135–146)
TOTAL PROTEIN: 6.8 g/dL (ref 6.1–8.1)

## 2016-11-29 LAB — PAIN MGMT, PROFILE 5 W/CONF, U
ALPHAHYDROXYMIDAZOLAM: NEGATIVE ng/mL (ref <50)
Alphahydroxyalprazolam: NEGATIVE ng/mL (ref <25)
Alphahydroxytriazolam: NEGATIVE ng/mL (ref <50)
Aminoclonazepam: NEGATIVE ng/mL (ref <25)
Amphetamines: NEGATIVE ng/mL (ref <500)
Barbiturates: NEGATIVE ng/mL (ref <300)
Benzodiazepines: NEGATIVE ng/mL (ref <100)
COCAINE METABOLITE: NEGATIVE ng/mL (ref <150)
CREATININE: 137.2 mg/dL (ref >=20.0)
HYDROXYETHYLFLURAZEPAM: NEGATIVE ng/mL (ref <50)
Lorazepam: NEGATIVE ng/mL (ref <50)
MARIJUANA METABOLITE: 211 ng/mL — AB (ref <5)
MARIJUANA METABOLITE: POSITIVE ng/mL — AB (ref <20)
Methadone Metabolite: NEGATIVE ng/mL (ref <100)
NORDIAZEPAM: NEGATIVE ng/mL (ref <50)
OPIATES: NEGATIVE ng/mL (ref <100)
OXIDANT: NEGATIVE ug/mL (ref <200)
Oxazepam: NEGATIVE ng/mL (ref <50)
Oxycodone: NEGATIVE ng/mL (ref <100)
PH: 6.78 (ref 4.5–9.0)
TEMAZEPAM: NEGATIVE ng/mL (ref <50)

## 2016-12-04 ENCOUNTER — Telehealth: Payer: Self-pay | Admitting: Rheumatology

## 2016-12-04 NOTE — Telephone Encounter (Signed)
Patient returned your call.  CB#[phone].  Thank you.

## 2016-12-04 NOTE — Telephone Encounter (Signed)
Patient advised of lab results and verbalized understanding.  

## 2017-01-10 ENCOUNTER — Other Ambulatory Visit: Payer: Self-pay | Admitting: Internal Medicine

## 2017-01-16 ENCOUNTER — Other Ambulatory Visit: Payer: Self-pay | Admitting: Internal Medicine

## 2017-01-27 NOTE — Telephone Encounter (Signed)
LM for patient to return call to schedule AWV.   

## 2017-01-29 ENCOUNTER — Ambulatory Visit: Payer: Medicare Other | Admitting: Internal Medicine

## 2017-01-29 ENCOUNTER — Telehealth: Payer: Self-pay | Admitting: Internal Medicine

## 2017-01-29 DIAGNOSIS — Z0289 Encounter for other administrative examinations: Secondary | ICD-10-CM

## 2017-01-29 NOTE — Telephone Encounter (Signed)
Patient lvm at 11:02am cancelling 2pm appointment today due to emergency, charge or no charge

## 2017-01-29 NOTE — Telephone Encounter (Signed)
No, thx 

## 2017-02-16 ENCOUNTER — Other Ambulatory Visit: Payer: Self-pay | Admitting: Internal Medicine

## 2017-03-22 ENCOUNTER — Encounter: Payer: Self-pay | Admitting: Internal Medicine

## 2017-03-22 ENCOUNTER — Ambulatory Visit (INDEPENDENT_AMBULATORY_CARE_PROVIDER_SITE_OTHER): Payer: Medicare Other | Admitting: Internal Medicine

## 2017-03-22 ENCOUNTER — Ambulatory Visit (HOSPITAL_BASED_OUTPATIENT_CLINIC_OR_DEPARTMENT_OTHER)
Admission: RE | Admit: 2017-03-22 | Discharge: 2017-03-22 | Disposition: A | Discharge status: Home / Self Care | Payer: Medicare Other | Source: Ambulatory Visit | Attending: Internal Medicine | Admitting: Internal Medicine

## 2017-03-22 VITALS — BP 108/65 | HR 94 | Temp 99.1°F | Ht 60.0 in | Wt 95.4 lb

## 2017-03-22 DIAGNOSIS — M810 Age-related osteoporosis without current pathological fracture: Secondary | ICD-10-CM

## 2017-03-22 DIAGNOSIS — C50919 Malignant neoplasm of unspecified site of unspecified female breast: Secondary | ICD-10-CM

## 2017-03-22 DIAGNOSIS — R0600 Dyspnea, unspecified: Secondary | ICD-10-CM | POA: Diagnosis not present

## 2017-03-22 DIAGNOSIS — Z0000 Encounter for general adult medical examination without abnormal findings: Secondary | ICD-10-CM

## 2017-03-22 DIAGNOSIS — J439 Emphysema, unspecified: Secondary | ICD-10-CM | POA: Diagnosis not present

## 2017-03-22 DIAGNOSIS — R0609 Other forms of dyspnea: Secondary | ICD-10-CM

## 2017-03-22 MED ORDER — BUDESONIDE-FORMOTEROL FUMARATE 80-4.5 MCG/ACT IN AERO: 2.0000 | INHALATION_SPRAY | Freq: Two times a day (BID) | RESPIRATORY_TRACT | 6 refills | Status: DC

## 2017-03-22 MED ORDER — CITALOPRAM HYDROBROMIDE 20 MG PO TABS: 30.0000 mg | ORAL_TABLET | Freq: Every day | ORAL | 6 refills | Status: DC

## 2017-03-22 NOTE — Assessment & Plan Note (Addendum)
-  Td 2013; PNM and shingles shot  discussed, declined  -Cervical cancer screening: not indicated, s/p hysterectomy for benign reasons - h/o B mastectomy, see comments under h/o  breast cancer -Last Cscope 01-2013 Dr Cher Nakai, negative, repeat in 5 years d/t h/o polyps, see report Diet and exercise discussed -Labs: Will come back fasting: CMP, FLP, CBC, TSH
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
## 2017-03-22 NOTE — Patient Instructions (Addendum)
GO TO THE FRONT DESK Schedule labs to be done this week, fasting    Schedule your next appointment for a checkup in 3 months     STOP BY THE FIRST FLOOR:  get the XR   Start a medication called Symbicort twice a day  Keep the albuterol as a rescue inhaler in case you have cough or difficulty breathing.  Go back on citalopram 20 mg: Half tablet a day for one week 1 tablet a day for one week Then  1.5 tablets every day   Referrals: Bone density test, oncology.

## 2017-03-22 NOTE — Progress Notes (Signed)
Pre visit review using our clinic review tool, if applicable. No additional management support is needed unless otherwise documented below in the visit note. 
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
## 2017-03-31 ENCOUNTER — Ambulatory Visit (INDEPENDENT_AMBULATORY_CARE_PROVIDER_SITE_OTHER): Payer: Medicare Other | Admitting: Internal Medicine

## 2017-03-31 DIAGNOSIS — R0609 Other forms of dyspnea: Secondary | ICD-10-CM

## 2017-03-31 LAB — PULMONARY FUNCTION TEST
DL/VA % PRED: 62 %
DL/VA: 2.65 ml/min/mmHg/L
DLCO unc % pred: 49 %
DLCO unc: 9.32 ml/min/mmHg
FEF 25-75 POST: 0.6 L/s
FEF 25-75 Pre: 0.34 L/sec
FEF2575-%CHANGE-POST: 80 %
FEF2575-%PRED-POST: 33 %
FEF2575-%Pred-Pre: 18 %
FEV1-%CHANGE-POST: 28 %
FEV1-%PRED-PRE: 40 %
FEV1-%Pred-Post: 52 %
FEV1-PRE: 0.82 L
FEV1-Post: 1.06 L
FEV1FVC-%Change-Post: 4 %
FEV1FVC-%PRED-PRE: 59 %
FEV6-%Change-Post: 25 %
FEV6-%Pred-Post: 85 %
FEV6-%Pred-Pre: 67 %
FEV6-POST: 2.15 L
FEV6-Pre: 1.71 L
FEV6FVC-%Change-Post: 1 %
FEV6FVC-%PRED-POST: 101 %
FEV6FVC-%Pred-Pre: 100 %
FVC-%CHANGE-POST: 23 %
FVC-%PRED-POST: 83 %
FVC-%PRED-PRE: 67 %
FVC-POST: 2.2 L
FVC-PRE: 1.78 L
PRE FEV6/FVC RATIO: 96 %
Post FEV1/FVC ratio: 48 %
Post FEV6/FVC ratio: 98 %
Pre FEV1/FVC ratio: 46 %
RV % pred: 191 %
RV: 3.68 L
TLC % pred: 125 %
TLC: 5.59 L

## 2017-03-31 NOTE — Progress Notes (Signed)
PFT done today. 

## 2017-04-01 ENCOUNTER — Other Ambulatory Visit (INDEPENDENT_AMBULATORY_CARE_PROVIDER_SITE_OTHER): Payer: Medicare Other

## 2017-04-01 DIAGNOSIS — Z0000 Encounter for general adult medical examination without abnormal findings: Secondary | ICD-10-CM | POA: Diagnosis not present

## 2017-04-01 LAB — CBC WITH DIFFERENTIAL/PLATELET
BASOS ABS: 0 10*3/uL (ref 0.0–0.1)
Basophils Relative: 0.8 % (ref 0.0–3.0)
Eosinophils Absolute: 0.1 10*3/uL (ref 0.0–0.7)
Eosinophils Relative: 1.7 % (ref 0.0–5.0)
HEMATOCRIT: 35.5 % — AB (ref 36.0–46.0)
Hemoglobin: 11.8 g/dL — ABNORMAL LOW (ref 12.0–15.0)
LYMPHS PCT: 37.7 % (ref 12.0–46.0)
Lymphs Abs: 1.5 10*3/uL (ref 0.7–4.0)
MCHC: 33.4 g/dL (ref 30.0–36.0)
MCV: 94.3 fl (ref 78.0–100.0)
MONOS PCT: 9.8 % (ref 3.0–12.0)
Monocytes Absolute: 0.4 10*3/uL (ref 0.1–1.0)
NEUTROS PCT: 50 % (ref 43.0–77.0)
Neutro Abs: 1.9 10*3/uL (ref 1.4–7.7)
Platelets: 330 10*3/uL (ref 150.0–400.0)
RBC: 3.76 Mil/uL — AB (ref 3.87–5.11)
RDW: 12.7 % (ref 11.5–15.5)
WBC: 3.9 10*3/uL — ABNORMAL LOW (ref 4.0–10.5)

## 2017-04-01 LAB — LIPID PANEL
CHOL/HDL RATIO: 2
Cholesterol: 220 mg/dL — ABNORMAL HIGH (ref 0–200)
HDL: 97.9 mg/dL (ref >39.00)
LDL Cholesterol: 105 mg/dL — ABNORMAL HIGH (ref 0–99)
NONHDL: 122
Triglycerides: 83 mg/dL (ref 0.0–149.0)
VLDL: 16.6 mg/dL (ref 0.0–40.0)

## 2017-04-01 LAB — COMPREHENSIVE METABOLIC PANEL
ALT: 13 U/L (ref 0–35)
AST: 23 U/L (ref 0–37)
Albumin: 4.2 g/dL (ref 3.5–5.2)
Alkaline Phosphatase: 49 U/L (ref 39–117)
BILIRUBIN TOTAL: 0.5 mg/dL (ref 0.2–1.2)
BUN: 16 mg/dL (ref 6–23)
CALCIUM: 9.9 mg/dL (ref 8.4–10.5)
CO2: 32 meq/L (ref 19–32)
CREATININE: 0.9 mg/dL (ref 0.40–1.20)
Chloride: 103 mEq/L (ref 96–112)
GFR: 66.45 mL/min (ref >60.00)
GLUCOSE: 97 mg/dL (ref 70–99)
Potassium: 3.9 mEq/L (ref 3.5–5.1)
Sodium: 142 mEq/L (ref 135–145)
Total Protein: 6.9 g/dL (ref 6.0–8.3)

## 2017-04-01 LAB — TSH: TSH: 2.62 u[IU]/mL (ref 0.35–4.50)

## 2017-04-02 ENCOUNTER — Other Ambulatory Visit: Payer: Self-pay | Admitting: Rheumatology

## 2017-04-02 NOTE — Telephone Encounter (Signed)
Last Visit: 11/24/16 Next Visit: 05/27/17  Okay to refill per Dr. Estanislado Pandy

## 2017-05-07 ENCOUNTER — Other Ambulatory Visit: Payer: Self-pay | Admitting: *Deleted

## 2017-05-07 MED ORDER — METAXALONE 800 MG PO TABS: 800.0000 mg | ORAL_TABLET | Freq: Three times a day (TID) | ORAL | 4 refills | Status: DC

## 2017-05-07 NOTE — Telephone Encounter (Signed)
Fax received regarding Methocarbamol being on backorder and unavailable. requesting medication change. Per Dr. Estanislado Pandy okay to send in prescription for Skelaxin

## 2017-05-27 ENCOUNTER — Ambulatory Visit: Payer: Medicare Other | Admitting: Rheumatology

## 2017-05-27 ENCOUNTER — Telehealth: Payer: Self-pay | Admitting: *Deleted

## 2017-05-27 NOTE — Telephone Encounter (Signed)
Received Provider Query from [facility] for Medical Record Clarification on Depression for Coding purposes, OV note attached; forwarded to provider/SLS 09/27

## 2017-05-31 NOTE — Telephone Encounter (Signed)
Form completed and faxed to [facility] at [phone]. Form sent for scanning.

## 2017-06-03 ENCOUNTER — Telehealth: Payer: Self-pay | Admitting: Rheumatology

## 2017-06-03 ENCOUNTER — Other Ambulatory Visit: Payer: Self-pay | Admitting: Rheumatology

## 2017-06-03 NOTE — Telephone Encounter (Signed)
Per patient [facility] has 750mg  of Methacarpinal.

## 2017-06-03 NOTE — Telephone Encounter (Signed)
Patient calling requesting refill on Methacarbonal 500mg , but 500mg  not available. Asking for replacement rx called to [facility] on [address]. Please call patient to advise.

## 2017-06-03 NOTE — Telephone Encounter (Signed)
Patient has verified they have the Methocarbamol 750 mg.  Okay to send Methocarbamol 750 mg BID?

## 2017-06-03 NOTE — Telephone Encounter (Signed)
ok 

## 2017-06-03 NOTE — Addendum Note (Signed)
Addended by: Carole Binning on: 06/03/2017 04:22 PM   Modules accepted: Orders

## 2017-06-03 NOTE — Telephone Encounter (Signed)
See previous phone note.  

## 2017-06-03 NOTE — Telephone Encounter (Signed)
Patient states the pharmacy does not have the Methocarbamol 500 mg. Patient states the Skelaxin is $300 and she can not afford that. Patient is contacting the pharmacy to see if they have the Methocarbamol 750 mg dose.

## 2017-06-04 MED ORDER — METHOCARBAMOL 750 MG PO TABS: 750.0000 mg | ORAL_TABLET | Freq: Two times a day (BID) | ORAL | 2 refills | Status: DC | PRN

## 2017-06-21 ENCOUNTER — Ambulatory Visit: Payer: Medicare Other | Admitting: Internal Medicine

## 2017-07-01 ENCOUNTER — Ambulatory Visit (INDEPENDENT_AMBULATORY_CARE_PROVIDER_SITE_OTHER): Payer: Medicare Other | Admitting: Internal Medicine

## 2017-07-01 VITALS — BP 108/74 | HR 69 | Temp 97.9°F | Resp 14 | Ht 60.0 in | Wt 97.1 lb

## 2017-07-01 DIAGNOSIS — F419 Anxiety disorder, unspecified: Secondary | ICD-10-CM

## 2017-07-01 DIAGNOSIS — F329 Major depressive disorder, single episode, unspecified: Secondary | ICD-10-CM | POA: Diagnosis not present

## 2017-07-01 DIAGNOSIS — M810 Age-related osteoporosis without current pathological fracture: Secondary | ICD-10-CM | POA: Diagnosis not present

## 2017-07-01 DIAGNOSIS — J449 Chronic obstructive pulmonary disease, unspecified: Secondary | ICD-10-CM

## 2017-07-01 MED ORDER — HYDROXYZINE HCL 25 MG PO TABS: 25.0000 mg | ORAL_TABLET | Freq: Every evening | ORAL | 3 refills | Status: DC | PRN

## 2017-07-01 MED ORDER — CITALOPRAM HYDROBROMIDE 40 MG PO TABS: 40.0000 mg | ORAL_TABLET | Freq: Every day | ORAL | 6 refills | Status: DC

## 2017-07-01 NOTE — Progress Notes (Signed)
Pre visit review using our clinic review tool, if applicable. No additional management support is needed unless otherwise documented below in the visit note. 

## 2017-07-01 NOTE — Patient Instructions (Signed)
  GO TO THE FRONT DESK Schedule your next appointment for a  Follow up in 3 months  Increase citalopram to 40 mg daily  Try Atarax 25 mg 1 tablet at bedtime to help with anxiety and difficulty sleeping

## 2017-07-01 NOTE — Progress Notes (Signed)
Subjective:    Patient ID: [name], female    DOB: 04-22-1951, 65 y.o.   MRN: [id number]  DOS:  07/01/2017 Type of visit - description : rov Interval history: COPD: Since the last time she was here, PFTs confirm COPD, on Symbicort. Continue with anxiety and depression: Overall depression is better with citalopram, anxiety is still there. Interestingly, she smokes marijuana daily for more than 30 years, she failed a drug test with the rheumatologist and they won't be able to Rx Ultram for her thus she is trying to quit marijuana Requests  Ativan, she thinks will have a hard time sleeping and relaxing when she quit marijuana.  Review of Systems Denies chronic cough DOE slightly better with Symbicort  Past Medical History:  Diagnosis Date  . Anemia    B12 deficient  . B12 deficiency   . Breast cancer ([place])    left breast.  . Chronic SI joint pain    Bilateral, Dr. Estanislado Pandy  . Fibromyalgia    Dr. Estanislado Pandy  . GERD (gastroesophageal reflux disease)   . Greater trochanteric bursitis of both hips    Dr. Estanislado Pandy  . Hyperlipidemia   . IBS (irritable bowel syndrome)   . LV dysfunction    iatrogenic from chemotherapy ; on Carvedilol   . Migraines   . Osteoporosis    Dr Lomax---> transferring to a new gyn    Past Surgical History:  Procedure Laterality Date  . ABDOMINAL HYSTERECTOMY     BSO for  Endometriosis  . CARPAL TUNNEL RELEASE     left  . CERVICAL DISC SURGERY    . COLONOSCOPY W/ POLYPECTOMY  1999   Dr Earlean Shawl  . ESOPHAGEAL DILATION  2005   Dr Earlean Shawl  . MASTECTOMY  2009   bilateral, Dr Margot Chimes  . SEPTOPLASTY      Social History   Social History  . Marital status: Married    Spouse name: N/A  . Number of children: 1  . Years of education: N/A   Occupational History  . retired, Medical sales representative     Social History Main Topics  . Smoking status: Former Smoker    Quit date: 08/31/1990  . Smokeless tobacco: Never Used     Comment: smoked 1971-1992, up to  1 ppd  . Alcohol use No  . Drug use: No  . Sexual activity: Not Currently   Other Topics Concern  . Not on file   Social History Narrative   Lives w/ husband      Allergies as of 07/01/2017      Reactions   Amoxicillin-pot Clavulanate    diarrhea   Doxycycline    Nausea & vomiting   Venlafaxine    ? Reaction; ? Blurred vision      Medication List       Accurate as of 07/01/17 11:59 PM. Always use your most recent med list.          albuterol 108 (90 Base) MCG/ACT inhaler Commonly known as:  PROAIR HFA Inhale 2 puffs into the lungs every 4 (four) hours as needed for wheezing or shortness of breath.   budesonide-formoterol 80-4.5 MCG/ACT inhaler Commonly known as:  SYMBICORT Inhale 2 puffs into the lungs 2 (two) times daily.   cetirizine 10 MG tablet Commonly known as:  ZYRTEC Take 10 mg by mouth daily.   citalopram 40 MG tablet Commonly known as:  CELEXA Take 1 tablet (40 mg total) by mouth daily.   hydrOXYzine 25 MG tablet  Commonly known as:  ATARAX/VISTARIL Take 1 tablet (25 mg total) by mouth at bedtime as needed.   metaxalone 800 MG tablet Commonly known as:  SKELAXIN Take 1 tablet (800 mg total) by mouth 3 (three) times daily.   methocarbamol 750 MG tablet Commonly known as:  ROBAXIN Take 1 tablet (750 mg total) by mouth 2 (two) times daily as needed for muscle spasms.   PROBIOTIC PO Take 1 tablet by mouth daily.   traMADol 50 MG tablet Commonly known as:  ULTRAM TAKE 1 TO 2 TABLETS TWICE A DAY AS NEEDED          Objective:   Physical Exam BP 108/74 (BP Location: Left Arm, Patient Position: Sitting, Cuff Size: Small)   Pulse 69   Temp 97.9 F (36.6 C) (Oral)   Resp 14   Ht 5' (1.524 m)   Wt 97 lb 2 oz (44.1 kg)   SpO2 96%   BMI 18.97 kg/m  General:   Well developed, well nourished . NAD.  HEENT:  Normocephalic . Face symmetric, atraumatic Lungs:  Decreased breath sounds Normal respiratory effort, no intercostal retractions, no  accessory muscle use. Heart: RRR,  no murmur.  No pretibial edema bilaterally  Skin: Not pale. Not jaundice Neurologic:  alert & oriented X3.  Speech normal, gait appropriate for age and unassisted Psych--  Cognition and judgment appear intact.  Cooperative with normal attention span and concentration.  Behavior appropriate. No anxious or depressed appearing.      Assessment & Plan:    Assessment Hyperlipidemia B12 deficiency anemia  Anxiety, depression, insomnia COPD: PFTs  02/2017: severe Emphysema with reversible component GI: --GERD, esophageal stricture -- IBS, Dr Cher Nakai [facility]: tramadol per Dr Herold Harms --Fibromyalgia --chronic neck and  SI joint pain --DJD Reactive airway disease, former smoker. Parking permit  [phone] Migraines  Osteoporosis:  last Tscore -2.7 (2014).previopusly f/u gyn/cancer center  H/o into to fosamax-actonel; Also intolerant to Atelvia as of 03/22/2017 Vitamin D deficiency Left Breast cancer, s/p bilateral mastectomy 2009, no XRT, chemo d/c d/t heart issues , s/p letrozol x 5 years  h/o   LV dysfunction from chemotherapy ---> resolved   Plan:  Anxiety, depression, insomnia: Depression improved compared to the last visit, no suicidal ideas, still somewhat anxious, increase citalopram to 40 mg daily. She smokes marijuana daily but is trying to quit as she failed a urine tests at rheumatology.  Request Ativan to help with anxiety and insomnia, she believes those symptoms will get worse when she quits marijuana.  Recommend to try Atarax at bedtime. COPD: Since the last visit, PFTs show severe emphysema with reversible component.  Never had cough.  On Symbicort, helps with DOE.  No change Osteoporosis: dexa referral failed again. Primary care: declined a flu shot  RTC 3 months

## 2017-07-02 NOTE — Assessment & Plan Note (Signed)
Anxiety, depression, insomnia: Depression improved compared to the last visit, no suicidal ideas, still somewhat anxious, increase citalopram to 40 mg daily. She smokes marijuana daily but is trying to quit as she failed a urine tests at rheumatology.  Request Ativan to help with anxiety and insomnia, she believes those symptoms will get worse when she quits marijuana.  Recommend to try Atarax at bedtime. COPD: Since the last visit, PFTs show severe emphysema with reversible component.  Never had cough.  On Symbicort, helps with DOE.  No change Osteoporosis: dexa referral failed again. Primary care: declined a flu shot  RTC 3 months

## 2017-07-06 ENCOUNTER — Ambulatory Visit: Payer: Self-pay | Admitting: Internal Medicine

## 2017-08-01 NOTE — Progress Notes (Signed)
Office Visit Note  Patient: [name]             Date of Birth: 02/01/1951           MRN: [id number]             PCP: Colon Branch, MD Referring: Colon Branch, MD Visit Date: 08/02/2017 Occupation: @GUAROCC @    Subjective:  Fibromyalgia (neck pain, right hip and right elbow pain)   History of Present Illness: [name] is a 66 y.o. female with history of fibromyalgia syndrome, disc disease and osteoarthritis. She states she's been having some stiffness and pain in her neck especially in the right trapezius area. She also has discomfort in her right trochanteric area and her right elbow. She continues to have lower back pain. Her hands to stay stiff especially the right hand.  Activities of Daily Living:  Patient reports morning stiffness for 1 hour.   Patient Denies nocturnal pain.  Difficulty dressing/grooming: Denies Difficulty climbing stairs: Denies Difficulty getting out of chair: Denies Difficulty using hands for taps, buttons, cutlery, and/or writing: Reports   Review of Systems  Constitutional: Positive for fatigue. Negative for night sweats, weight gain, weight loss and weakness.  HENT: Negative for mouth sores, trouble swallowing, trouble swallowing, mouth dryness and nose dryness.   Eyes: Negative for pain, redness, visual disturbance and dryness.  Respiratory: Negative for cough, shortness of breath and difficulty breathing.   Cardiovascular: Negative for chest pain, palpitations, hypertension, irregular heartbeat and swelling in legs/feet.  Gastrointestinal: Negative for blood in stool, constipation and diarrhea.  Endocrine: Negative for increased urination.  Genitourinary: Negative for vaginal dryness.  Musculoskeletal: Positive for arthralgias, joint pain, myalgias, morning stiffness and myalgias. Negative for joint swelling, muscle weakness and muscle tenderness.  Skin: Negative for color change, rash, hair loss, skin tightness, ulcers and  sensitivity to sunlight.  Allergic/Immunologic: Negative for susceptible to infections.  Neurological: Negative for dizziness, memory loss and night sweats.  Hematological: Negative for swollen glands.  Psychiatric/Behavioral: Positive for depressed mood and sleep disturbance. The patient is nervous/anxious.     PMFS History:  Patient Active Problem List   Diagnosis Date Noted  . Fibromyalgia syndrome 11/23/2016  . Other insomnia 11/23/2016  . Trapezius muscle spasm 11/23/2016  . DDD (degenerative disc disease), cervical 11/23/2016  . DDD (degenerative disc disease), lumbar 11/23/2016  . Other idiopathic scoliosis, lumbar region 11/23/2016  . Malignant neoplasm of female breast ([place]) 11/23/2016  . History of gastroesophageal reflux (GERD) 11/23/2016  . PCP NOTES >>>>>>>>>>>>>>>>>>>> 11/21/2015  . Constipation 02/15/2015  . Annual physical exam 11/20/2013  . COPD (chronic obstructive pulmonary disease) ([place]) 11/20/2013  . Unspecified adverse effect of unspecified drug, medicinal and biological substance 12/10/2011  . Vitamin D deficiency 07/06/2011  . CARDIOMYOPATHY, PRIMARY, DILATED 01/01/2009  . BREAST CANCER, HX OF 11/02/2008  . HYPERLIPIDEMIA 10/18/2007  . GERD 10/18/2007  . ANEMIA, B12 DEFICIENCY 10/12/2007  . Anxiety,depression, insomnia 10/12/2007  . IBS 10/12/2007  . Primary osteoarthritis of both hands 10/12/2007  . Osteoporosis 10/12/2007  . COLONIC POLYPS, HX OF 10/12/2007  . MIGRAINES, HX OF 10/12/2007  . Fibromyalgia--Dr. Herold Harms, on Ultram 07/14/2007    Past Medical History:  Diagnosis Date  . Anemia    B12 deficient  . B12 deficiency   . Breast cancer ([place])    left breast.  . Chronic SI joint pain    Bilateral, Dr. Estanislado Pandy  . Fibromyalgia    Dr. Estanislado Pandy  . GERD (gastroesophageal  reflux disease)   . Greater trochanteric bursitis of both hips    Dr. Estanislado Pandy  . Hyperlipidemia   . IBS (irritable bowel syndrome)   . LV dysfunction    iatrogenic  from chemotherapy ; on Carvedilol   . Migraines   . Osteoporosis    Dr Lomax---> transferring to a new gyn    Family History  Problem Relation Age of Onset  . Diabetes Mother   . Breast cancer Mother   . Depression Mother        anxiety  . Heart disease Mother        in her 28s  . Colon cancer Sister   . Healthy Daughter   . Stroke Neg Hx    Past Surgical History:  Procedure Laterality Date  . ABDOMINAL HYSTERECTOMY     BSO for  Endometriosis  . CARPAL TUNNEL RELEASE     left  . CERVICAL DISC SURGERY    . COLONOSCOPY W/ POLYPECTOMY  1999   Dr Earlean Shawl  . ESOPHAGEAL DILATION  2005   Dr Earlean Shawl  . MASTECTOMY  2009   bilateral, Dr Margot Chimes  . SEPTOPLASTY     Social History   Social History Narrative   Lives w/ husband     Objective: Vital Signs: BP 122/68 (BP Location: Right Arm, Patient Position: Sitting, Cuff Size: Normal)   Pulse 74   Resp 14   Ht 5' (1.524 m)   Wt 98 lb (44.5 kg)   BMI 19.14 kg/m    Physical Exam  Constitutional: She is oriented to person, place, and time. She appears well-developed and well-nourished.  HENT:  Head: Normocephalic and atraumatic.  Eyes: Conjunctivae and EOM are normal.  Neck: Normal range of motion.  Cardiovascular: Normal rate, regular rhythm, normal heart sounds and intact distal pulses.  Pulmonary/Chest: Effort normal and breath sounds normal.  Abdominal: Soft. Bowel sounds are normal.  Lymphadenopathy:    She has no cervical adenopathy.  Neurological: She is alert and oriented to person, place, and time.  Skin: Skin is warm and dry. Capillary refill takes less than 2 seconds.  Psychiatric: She has a normal mood and affect. Her behavior is normal.  Nursing note and vitals reviewed.    Musculoskeletal Exam: C-spine and lumbar spine limited range of motion with some stiffness. Shoulder joints elbow joints wrist joint MCPs PIPs DIPs with good range of motion. She is some trapezius is spasm on the right side. She has  tenderness on palpation of her right lateral epicondyle area. Hip joints knee joints ankles MTPs PIPs with good range of motion. She is some tenderness over right trochanteric and right SI joint area. No synovitis was noted on examination. Fibromyalgia tender points are 16 out of 18 positive.  CDAI Exam: No CDAI exam completed.    Investigation: No additional findings.   Imaging: No results found.  Speciality Comments: No specialty comments available.    Procedures:  Trigger Point Inj Date/Time: 08/02/2017 12:36 PM Performed by: Bo Merino, MD Authorized by: Bo Merino, MD   Consent Given by:  Patient Site marked: the procedure site was marked   Timeout: prior to procedure the correct patient, procedure, and site was verified   Indications:  Muscle spasm and pain Total # of Trigger Points:  1 Location: neck   Needle Size:  27 G Approach:  Dorsal Medications #1:  10 mg triamcinolone acetonide 40 MG/ML; 0.3 mL lidocaine 1 % Patient tolerance:  Patient tolerated the procedure well with no  immediate complications   Allergies: Amoxicillin-pot clavulanate; Doxycycline; and Venlafaxine   Assessment / Plan:     Visit Diagnoses: Fibromyalgia - she continues to have some generalized pain from fibromyalgia. She also has significant amount of fatigue. Need for regular exercise was discussed. patient previously being prescribed Tramadol 11/24/2016: patient broke Narcotic agreement. (marijuana was found on UDS)  DDD (degenerative disc disease), cervical: Chronic pain  Neck pain: She had right trapezius is spasm. After informed consent was obtained the right trapezius area was injected with cortisone the procedures described above. Side effects were reviewed at length.  DDD (degenerative disc disease), lumbar: Chronic pain  Trochanteric bursitis of right hip: Chronic pain ITB and exercise were demonstrated.  Lateral epicondylitis of right elbow: She's been having some  discomfort in the right lateral epicondyle area. I offered faltering gel which she declined. A handout on exercises was given.  History of scoliosis  Primary osteoarthritis of both hands: Joint protection and muscle strengthening discussed.  Age-related osteoporosis without current pathological fracture: Followed up by her PCP.  Other insomnia  History of migraine  History of vitamin D deficiency: She is on supplement.  Other medical problems are listed as follows:  History of gastroesophageal reflux (GERD)  History of depression  History of breast cancer  History of COPD  History of anxiety  History of hyperlipidemia  History of IBS    Orders: Orders Placed This Encounter  Procedures  . Trigger Point Inj   No orders of the defined types were placed in this encounter.   Face-to-face time spent with patient was 30 minutes. Greater than 50% of time was spent in counseling and coordination of care.  Follow-Up Instructions: Return in about 6 months (around 01/31/2018) for FMS OA DDD.   Bo Merino, MD  Note - This record has been created using Editor, commissioning.  Chart creation errors have been sought, but may not always  have been located. Such creation errors do not reflect on  the standard of medical care.

## 2017-08-02 ENCOUNTER — Encounter: Payer: Self-pay | Admitting: Rheumatology

## 2017-08-02 ENCOUNTER — Ambulatory Visit: Payer: Medicare Other | Admitting: Rheumatology

## 2017-08-02 VITALS — BP 122/68 | HR 74 | Resp 14 | Ht 60.0 in | Wt 98.0 lb

## 2017-08-02 DIAGNOSIS — M542 Cervicalgia: Secondary | ICD-10-CM | POA: Diagnosis not present

## 2017-08-02 DIAGNOSIS — Z8659 Personal history of other mental and behavioral disorders: Secondary | ICD-10-CM | POA: Diagnosis not present

## 2017-08-02 DIAGNOSIS — Z8639 Personal history of other endocrine, nutritional and metabolic disease: Secondary | ICD-10-CM

## 2017-08-02 DIAGNOSIS — Z8709 Personal history of other diseases of the respiratory system: Secondary | ICD-10-CM

## 2017-08-02 DIAGNOSIS — Z8719 Personal history of other diseases of the digestive system: Secondary | ICD-10-CM

## 2017-08-02 DIAGNOSIS — G4709 Other insomnia: Secondary | ICD-10-CM

## 2017-08-02 DIAGNOSIS — M797 Fibromyalgia: Secondary | ICD-10-CM | POA: Diagnosis not present

## 2017-08-02 DIAGNOSIS — M19041 Primary osteoarthritis, right hand: Secondary | ICD-10-CM

## 2017-08-02 DIAGNOSIS — M7061 Trochanteric bursitis, right hip: Secondary | ICD-10-CM | POA: Diagnosis not present

## 2017-08-02 DIAGNOSIS — M7711 Lateral epicondylitis, right elbow: Secondary | ICD-10-CM | POA: Diagnosis not present

## 2017-08-02 DIAGNOSIS — M5030 Other cervical disc degeneration, unspecified cervical region: Secondary | ICD-10-CM

## 2017-08-02 DIAGNOSIS — Z8669 Personal history of other diseases of the nervous system and sense organs: Secondary | ICD-10-CM

## 2017-08-02 DIAGNOSIS — M810 Age-related osteoporosis without current pathological fracture: Secondary | ICD-10-CM

## 2017-08-02 DIAGNOSIS — M5136 Other intervertebral disc degeneration, lumbar region: Secondary | ICD-10-CM | POA: Diagnosis not present

## 2017-08-02 DIAGNOSIS — Z8739 Personal history of other diseases of the musculoskeletal system and connective tissue: Secondary | ICD-10-CM | POA: Diagnosis not present

## 2017-08-02 DIAGNOSIS — M51369 Other intervertebral disc degeneration, lumbar region without mention of lumbar back pain or lower extremity pain: Secondary | ICD-10-CM

## 2017-08-02 DIAGNOSIS — Z853 Personal history of malignant neoplasm of breast: Secondary | ICD-10-CM

## 2017-08-02 DIAGNOSIS — M19042 Primary osteoarthritis, left hand: Secondary | ICD-10-CM

## 2017-08-02 MED ORDER — LIDOCAINE HCL 1 % IJ SOLN
0.3000 mL | INTRAMUSCULAR | Status: AC | PRN
  Administered 2017-08-02: .3 mL

## 2017-08-02 MED ORDER — TRIAMCINOLONE ACETONIDE 40 MG/ML IJ SUSP
10.0000 mg | INTRAMUSCULAR | Status: AC | PRN
  Administered 2017-08-02: 10 mg via INTRAMUSCULAR

## 2017-08-02 NOTE — Patient Instructions (Signed)
Cervical Strain and Sprain Rehab Ask your health care provider which exercises are safe for you. Do exercises exactly as told by your health care provider and adjust them as directed. It is normal to feel mild stretching, pulling, tightness, or discomfort as you do these exercises, but you should stop right away if you feel sudden pain or your pain gets worse.Do not begin these exercises until told by your health care provider. Stretching and range of motion exercises These exercises warm up your muscles and joints and improve the movement and flexibility of your neck. These exercises also help to relieve pain, numbness, and tingling. Exercise A: Cervical side bend  1. Using good posture, sit on a stable chair or stand up. 2. Without moving your shoulders, slowly tilt your left / right ear to your shoulder until you feel a stretch in your neck muscles. You should be looking straight ahead. 3. Hold for __________ seconds. 4. Repeat with the other side of your neck. Repeat __________ times. Complete this exercise __________ times a day. Exercise B: Cervical rotation  1. Using good posture, sit on a stable chair or stand up. 2. Slowly turn your head to the side as if you are looking over your left / right shoulder. ? Keep your eyes level with the ground. ? Stop when you feel a stretch along the side and the back of your neck. 3. Hold for __________ seconds. 4. Repeat this by turning to your other side. Repeat __________ times. Complete this exercise __________ times a day. Exercise C: Thoracic extension and pectoral stretch 1. Roll a towel or a small blanket so it is about 4 inches (10 cm) in diameter. 2. Lie down on your back on a firm surface. 3. Put the towel lengthwise, under your spine in the middle of your back. It should not be not under your shoulder blades. The towel should line up with your spine from your middle back to your lower back. 4. Put your hands behind your head and let your  elbows fall out to your sides. 5. Hold for __________ seconds. Repeat __________ times. Complete this exercise __________ times a day. Strengthening exercises These exercises build strength and endurance in your neck. Endurance is the ability to use your muscles for a long time, even after your muscles get tired. Exercise D: Upper cervical flexion, isometric 1. Lie on your back with a thin pillow behind your head and a small rolled-up towel under your neck. 2. Gently tuck your chin toward your chest and nod your head down to look toward your feet. Do not lift your head off the pillow. 3. Hold for __________ seconds. 4. Release the tension slowly. Relax your neck muscles completely before you repeat this exercise. Repeat __________ times. Complete this exercise __________ times a day. Exercise E: Cervical extension, isometric  1. Stand about 6 inches (15 cm) away from a wall, with your back facing the wall. 2. Place a soft object, about 6-8 inches (15-20 cm) in diameter, between the back of your head and the wall. A soft object could be a small pillow, a ball, or a folded towel. 3. Gently tilt your head back and press into the soft object. Keep your jaw and forehead relaxed. 4. Hold for __________ seconds. 5. Release the tension slowly. Relax your neck muscles completely before you repeat this exercise. Repeat __________ times. Complete this exercise __________ times a day. Posture and body mechanics  Body mechanics refers to the movements and positions of   your body while you do your daily activities. Posture is part of body mechanics. Good posture and healthy body mechanics can help to relieve stress in your body's tissues and joints. Good posture means that your spine is in its natural S-curve position (your spine is neutral), your shoulders are pulled back slightly, and your head is not tipped forward. The following are general guidelines for applying improved posture and body mechanics to  your everyday activities. Standing  When standing, keep your spine neutral and keep your feet about hip-width apart. Keep a slight bend in your knees. Your ears, shoulders, and hips should line up.  When you do a task in which you stand in one place for a long time, place one foot up on a stable object that is 2-4 inches (5-10 cm) high, such as a footstool. This helps keep your spine neutral. Sitting   When sitting, keep your spine neutral and your keep feet flat on the floor. Use a footrest, if necessary, and keep your thighs parallel to the floor. Avoid rounding your shoulders, and avoid tilting your head forward.  When working at a desk or a computer, keep your desk at a height where your hands are slightly lower than your elbows. Slide your chair under your desk so you are close enough to maintain good posture.  When working at a computer, place your monitor at a height where you are looking straight ahead and you do not have to tilt your head forward or downward to look at the screen. Resting When lying down and resting, avoid positions that are most painful for you. Try to support your neck in a neutral position. You can use a contour pillow or a small rolled-up towel. Your pillow should support your neck but not push on it. This information is not intended to replace advice given to you by your health care provider. Make sure you discuss any questions you have with your health care provider. Document Released: 08/17/2005 Document Revised: 04/23/2016 Document Reviewed: 07/24/2015 [employer] Interactive Patient Education  2018 [place]. Elbow and Forearm Exercises Ask your health care provider which exercises are safe for you. Do exercises exactly as told by your health care provider and adjust them as directed. It is normal to feel mild stretching, pulling, tightness, or discomfort as you do these exercises, but you should stop right away if you feel sudden pain or your pain gets  worse.Do not begin these exercises until told by your health care provider. RANGE OF MOTION EXERCISES These exercises warm up your muscles and joints and improve the movement and flexibility of your injured elbow and forearm. These exercises also help to relieve pain, numbness, and tingling.These exercises are done using the muscles in your injured elbow and forearm. Exercise A: Elbow Flexion, Active 1. Hold your left / right arm at your side, and bend your elbow as far as you can using your left / right arm muscles. 2. Hold this position for __________ seconds. 3. Slowly return to the starting position. Repeat __________ times. Complete this exercise __________ times a day. Exercise B: Elbow Extension, Active 1. Hold your left / right arm at your side, and straighten your elbow as much as you can using your left / right arm muscles. 2. Hold this position for __________ seconds. 3. Slowly return to the starting position. Repeat __________ times. Complete this exercise __________ times a day. Exercise C: Forearm Rotation, Supination, Active 1. Stand or sit with your elbows at your sides.  2. Bend your left / right elbow to an "L" shape (90 degrees). 3. Turn your palm upward until you feel a gentle stretch on the inside of your forearm. 4. Hold this position for __________ seconds. 5. Slowly release and return to the starting position. Repeat __________ times. Complete this exercise __________ times a day. Exercise D: Forearm Rotation, Pronation, Active 1. Stand or sit with your elbows at your side. 2. Bend your left / right elbow to an "L" shape (90 degrees). 3. Turn your left / right palm downward until you feel a gentle stretch on the top of your forearm. 4. Hold this position for __________ seconds. 5. Slowly release and return to the starting position. Repeat__________ times. Complete this exercise __________ times a day. STRETCHING EXERCISES These exercises warm up your muscles and  joints and improve the movement and flexibility of your injured elbow and forearm. These exercises also help to relieve pain, numbness, and tingling.These exercises are done using your healthy elbow and forearm to help stretch the muscles in your injured elbow and forearm. Exercise E: Elbow Flexion, Active-Assisted  1. Hold your left / right arm at your side, and bend your elbow as much as you can using your left / right arm muscles. 2. Use your other hand to bend your left / right elbow farther. To do this, gently push up on your forearm until you feel a gentle stretch on the back of your elbow. 3. Hold this position for __________ seconds. 4. Slowly return to the starting position. Repeat __________ times. Complete this exercise __________ times a day. Exercise F: Elbow Extension, Active-Assisted  1. Hold your left / right arm at your side, and straighten your elbow as much as you can using your left / right arm muscles. 2. Use your other hand to straighten the left / right elbow farther. To do this, gently push down on your forearm until you feel a gentle stretch on the inside of your elbow. 3. Hold this position for __________ seconds. 4. Slowly return to the starting position. Repeat __________ times. Complete this exercise __________ times a day. Exercise G: Forearm Rotation, Supination, Active-Assisted  1. Sit with your left / right elbow bent in an "L" shape (90 degrees) with your forearm resting on a table. 2. Keeping your upper body and shoulder still, rotate your forearm so your left / right palm faces upward. 3. Use your other hand to help rotate your forearm further until you feel a gentle to moderate stretch. 4. Hold this position for __________ seconds. 5. Slowly release the stretch and return to the starting position. Repeat __________ times. Complete this exercise __________ times a day. Exercise H: Forearm Rotation, Pronation, Active-Assisted  1. Sit with your left / right  elbow bent in an "L" shape (90 degrees) with your forearm resting on a table. 2. Keeping your upper body and shoulder still, rotate your forearm so your palm faces the tabletop. 3. Use your other hand to help rotate your forearm further until you feel a gentle to moderate stretch. 4. Hold this position for __________ seconds. 5. Slowly release the stretch and return to the starting position. Repeat __________ times. Complete this exercise __________ times a day. Exercise I: Elbow Flexion, Supine, Passive 1. Lie on your back. 2. Extend your left / right arm up in the air, bracing it with your other hand. 3. Let your left / right your hand slowly lower toward your shoulder, while your elbow stays pointed toward the  ceiling. You should feel a gentle stretch along the back of your upper arm and elbow. 4. If instructed by your health care provider, you may increase the intensity of your stretch by adding a small wrist weight or hand weight. 5. Hold this position for __________ seconds. 6. Slowly return to the starting position. Repeat __________ times. Complete this exercise __________ times a day. Exercise J: Elbow Extension, Supine, Passive  1. Lie on your back. Make sure that you are in a comfortable position that lets you relax your arm muscles. 2. Place a folded towel under your left / right upper arm so your elbow and shoulder are at the same height. Straighten your left / right arm so your elbow does not rest on the bed or towel. 3. Let the weight of your hand stretch your elbow. Keep your arm and chest muscles relaxed. You should feel a stretch on the inside of your elbow. 4. If told by your health care provider, you may increase the intensity of your stretch by adding a small wrist weight or hand weight. 5. Hold this position for__________ seconds. 6. Slowly release the stretch. Repeat __________ times. Complete this exercise __________ times a day. STRENGTHENING EXERCISES These  exercises build strength and endurance in your elbow and forearm. Endurance is the ability to use your muscles for a long time, even after they get tired. Exercise K: Elbow Flexion, Isometric  1. Stand or sit up straight. 2. Bend your left / right elbow in an "L" shape (90 degrees) and turn your palm up so your forearm is at the height of your waist. 3. Place your other hand on top of your forearm. Gently push down as your left / right arm resists. Push as hard as you can with both arms without causing any pain or movement at your left / right elbow. 4. Hold this position for __________ seconds. 5. Slowly release the tension in both arms. Let your muscles relax completely before repeating. Repeat __________ times. Complete this exercise __________ times a day. Exercise L: Elbow Extensors, Isometric  1. Stand or sit up straight. 2. Place your left / right arm so your palm faces your abdomen and it is at the height of your waist. 3. Place your other hand on the underside of your forearm. Gently push up as your left / right arm resists. Push as hard as you can with both arms, without causing any pain or movement at your left / right elbow. 4. Hold this position for __________ seconds. 5. Slowly release the tension in both arms. Let your muscles relax completely before repeating. Repeat __________ times. Complete this exercise __________ times a day. Exercise M: Elbow Flexion With Forearm Palm Up  1. Sit upright on a firm chair without armrests, or stand. 2. Place your left / right arm at your side with your palm facing forward. 3. Holding a __________weight or gripping a rubber exercise band or tubing, bend your elbow to bring your hand toward your shoulder. 4. Hold this position for __________ seconds. 5. Slowly return to the starting position. Repeat __________times. Complete this exercise __________times a day. Exercise N: Elbow Extension  1. Sit on a firm chair without armrests, or  stand. 2. Keeping your upper arms at your sides, bring both hands up toward your left / right shoulder while you grip a rubber exercise band or tubing. Your left / right hand should be just below the other hand. 3. Straighten your left / right elbow. 4.  Hold this position for __________ seconds. 5. Control the resistance of the band or tubing as your hand returns to your side. Repeat __________times. Complete this exercise __________times a day. Exercise O: Forearm Rotation, Supination  1. Sit with your left / right forearm supported on a table. Keep your elbow at waist height. 2. Rest your hand over the edge of the table with your palm facing down. 3. Gently hold a lightweight hammer. 4. Without moving your elbow, slowly rotate your forearm to turn your palm and hand upward to a "thumbs-up" position. 5. Hold this position for __________ seconds. 6. Slowly return to the starting position. Repeat __________times. Complete this exercise __________times a day. Exercise P: Forearm Rotation, Pronation  1. Sit with your left / right forearm supported on a table. Keep your elbow below shoulder height. 2. Rest your hand over the edge of the table with your palm facing up. 3. Gently hold a lightweight hammer. 4. Without moving your elbow, slowly rotate your forearm to turn your palm and hand upward to a "thumbs-up" position. 5. Hold this position for __________seconds. 6. Slowly return to the starting position. Repeat __________times. Complete this exercise __________times a day. This information is not intended to replace advice given to you by your health care provider. Make sure you discuss any questions you have with your health care provider. Document Released: 07/01/2005 Document Revised: 12/26/2015 Document Reviewed: 05/12/2015 [employer] Interactive Patient Education  [employer].

## 2017-08-03 ENCOUNTER — Ambulatory Visit (INDEPENDENT_AMBULATORY_CARE_PROVIDER_SITE_OTHER): Payer: Medicare Other | Admitting: Family Medicine

## 2017-08-03 ENCOUNTER — Encounter: Payer: Self-pay | Admitting: Family Medicine

## 2017-08-03 VITALS — BP 118/70 | HR 76 | Ht 60.0 in | Wt 96.7 lb

## 2017-08-03 DIAGNOSIS — F4323 Adjustment disorder with mixed anxiety and depressed mood: Secondary | ICD-10-CM | POA: Diagnosis not present

## 2017-08-03 DIAGNOSIS — E538 Deficiency of other specified B group vitamins: Secondary | ICD-10-CM | POA: Insufficient documentation

## 2017-08-03 DIAGNOSIS — M797 Fibromyalgia: Secondary | ICD-10-CM

## 2017-08-03 DIAGNOSIS — Z853 Personal history of malignant neoplasm of breast: Secondary | ICD-10-CM | POA: Diagnosis not present

## 2017-08-03 DIAGNOSIS — Z86010 Personal history of colonic polyps: Secondary | ICD-10-CM | POA: Diagnosis not present

## 2017-08-03 DIAGNOSIS — Z1211 Encounter for screening for malignant neoplasm of colon: Secondary | ICD-10-CM | POA: Diagnosis not present

## 2017-08-03 DIAGNOSIS — Z833 Family history of diabetes mellitus: Secondary | ICD-10-CM | POA: Insufficient documentation

## 2017-08-03 DIAGNOSIS — J449 Chronic obstructive pulmonary disease, unspecified: Secondary | ICD-10-CM | POA: Diagnosis not present

## 2017-08-03 DIAGNOSIS — Z800 Family history of malignant neoplasm of digestive organs: Secondary | ICD-10-CM | POA: Insufficient documentation

## 2017-08-03 NOTE — Progress Notes (Signed)
New patient office visit note:  Impression and Recommendations:    1. Screening for colon cancer   2. Family history of diabetes mellitus- mom in 70's   3. Family history of colon cancer- sister died 67yo   4. History of breast cancer   5. Personal history of colonic polyps   6. Fibromyalgia--Dr. Herold Harms, on Ultram   7. Chronic obstructive pulmonary disease, unspecified COPD type ([place])   8. Adjustment disorder with mixed anxiety < depressed mood   9. B12 deficiency     Education and routine counseling performed. Handouts provided.  Orders Placed This Encounter  Procedures  . Ambulatory referral to Gastroenterology    Gross side effects, risk and benefits, and alternatives of medications discussed with patient.  Patient is aware that all medications have potential side effects and we are unable to predict every side effect or drug-drug interaction that may occur.  Expresses verbal understanding and consents to current therapy plan and treatment regimen.  Return for FBW early Feb- whole panel including b12, then OV with me 1-2 wks Later. .  Please see AVS handed out to patient at the end of our visit for further patient instructions/ counseling done pertaining to today's office visit.    Note: This document was prepared using Dragon voice recognition software and may include unintentional dictation errors.  ----------------------------------------------------------------------------------------------------------------------    Subjective:    Chief complaint:   Chief Complaint  Patient presents with  . Establish Care     HPI: [name] is a pleasant 66 y.o. female who presents to [facility] today to review their medical history with me and establish care.   I asked the patient to review their chronic problem list with me to ensure everything was updated and accurate.    All recent office visits with other providers, any  medical records that patient brought in etc  - I reviewed today.     Also asked pt to get me medical records from [facility] providers/ specialists that they had seen within the past 3-5 years- if they are in private practice and/or do not work for a [employer], [facility], [place], [place] or [employer] owned practice.  Told them to call their specialists to clarify this if they are not sure.   L ear and R- with rash- many yrs on and off-recent outbreak was 2 weeks now.  Did see dermatology in the past and not sure of the name of Dr. or treatment rendered  Coralyn Mark- counselor she saw at a [facility] office in [place].    Diagnosed with L breast cancer in 4/09. Underwent bilat mastectomies. Then 11/12 cycles of chemo stopped due to neuropathy. Completed 11/09. Then was treated with Herceptin in Nov and April 2010. Had MUGA in 11/19/08 which showed EF 57% which was reported as being up from 50% previously. Repeat MUGA 4/21 after Herceptin showed EF 34%.  We saw her in May 2010 and did Myoview which showed EF 72% with normal perfusion. ECHO in 5/11 EF 55-60%   Wt Readings from Last 3 Encounters:  09/30/17 97 lb (44 kg)  09/22/17 96 lb 14.4 oz (44 kg)  08/03/17 96 lb 11.2 oz (43.9 kg)   BP Readings from Last 3 Encounters:  09/30/17 116/68  09/22/17 114/71  08/03/17 118/70   Pulse Readings from Last 3 Encounters:  09/30/17 85  09/22/17 72  08/03/17 76   BMI Readings from Last 3 Encounters:  09/30/17  18.94 kg/m  09/22/17 18.92 kg/m  2017/08/07 18.89 kg/m    Patient Care Team    Relationship Specialty Notifications Start End  Mellody Dance, DO PCP - General Family Medicine  08/07/17   Bo Merino, MD Consulting Physician Rheumatology  10/02/14   West Pugh, NP (Inactive) Nurse Practitioner Gynecology  10/02/14   Richmond Campbell, MD Consulting Physician Gastroenterology  08/07/17   Druscilla Brownie, MD Consulting Physician Dermatology  Aug 07, 2017     Patient Active Problem List    Diagnosis Date Noted  . Family history of colon cancer- sister died 32yo 2017-08-07    Priority: High  . Adjustment disorder with mixed anxiety < depressed mood 2017/08/07    Priority: High  . Malignant neoplasm of female breast ([place]) 11/23/2016    Priority: High  . COPD (chronic obstructive pulmonary disease) ([place]) 11/20/2013    Priority: High  . h/o HYPERLIPIDEMIA 10/18/2007    Priority: High  . History of gastroesophageal reflux (GERD) 11/23/2016    Priority: Medium  . CARDIOMYOPATHY, PRIMARY, DILATED 01/01/2009    Priority: Medium  . ANEMIA, B12 DEFICIENCY 10/12/2007    Priority: Medium  . Anxiety,depression, insomnia 10/12/2007    Priority: Medium  . IBS 10/12/2007    Priority: Medium  . Fibromyalgia--Dr. Herold Harms, on Ultram 07/14/2007    Priority: Medium  . Family history of diabetes mellitus- mom in 50's 08-07-2017    Priority: Low    Class: Chronic  . B12 deficiency 2017-08-07    Priority: Low  . Other insomnia 11/23/2016    Priority: Low  . Vitamin D deficiency 07/06/2011    Priority: Low  . Osteoporosis 10/12/2007    Priority: Low  . MDD (major depressive disorder), severe ([place]) 09/22/2017  . MDD (major depressive disorder), recurrent episode, moderate ([place])   . Prolonged Q-T interval on ECG 09/20/2017  . Overdose 09/20/2017  . Acute cystitis with hematuria   . Suicide attempt ([place])   . Fibromyalgia syndrome 11/23/2016  . Trapezius muscle spasm 11/23/2016  . DDD (degenerative disc disease), cervical 11/23/2016  . DDD (degenerative disc disease), lumbar 11/23/2016  . Other idiopathic scoliosis, lumbar region 11/23/2016  . PCP NOTES >>>>>>>>>>>>>>>>>>>> 11/21/2015  . Constipation 02/15/2015  . Annual physical exam 11/20/2013  . Unspecified adverse effect of unspecified drug, medicinal and biological substance 12/10/2011  . BREAST CANCER, HX OF 11/02/2008  . GERD 10/18/2007  . Primary osteoarthritis of both hands 10/12/2007  . COLONIC POLYPS, HX OF  10/12/2007  . MIGRAINES, HX OF 10/12/2007     Past Medical History:  Diagnosis Date  . Anemia    B12 deficient  . B12 deficiency   . Breast cancer ([place])    left breast.  . Chronic SI joint pain    Bilateral, Dr. Estanislado Pandy  . Fibromyalgia    Dr. Estanislado Pandy  . GERD (gastroesophageal reflux disease)   . Greater trochanteric bursitis of both hips    Dr. Estanislado Pandy  . Hyperlipidemia   . IBS (irritable bowel syndrome)   . LV dysfunction    iatrogenic from chemotherapy ; on Carvedilol   . Migraines   . Osteoporosis    Dr Lomax---> transferring to a new gyn     Past Medical History:  Diagnosis Date  . Anemia    B12 deficient  . B12 deficiency   . Breast cancer ([place])    left breast.  . Chronic SI joint pain    Bilateral, Dr. Estanislado Pandy  . Fibromyalgia    Dr. Estanislado Pandy  .  GERD (gastroesophageal reflux disease)   . Greater trochanteric bursitis of both hips    Dr. Estanislado Pandy  . Hyperlipidemia   . IBS (irritable bowel syndrome)   . LV dysfunction    iatrogenic from chemotherapy ; on Carvedilol   . Migraines   . Osteoporosis    Dr Lomax---> transferring to a new gyn     Past Surgical History:  Procedure Laterality Date  . ABDOMINAL HYSTERECTOMY     BSO for  Endometriosis  . CARPAL TUNNEL RELEASE     left  . CERVICAL DISC SURGERY    . COLONOSCOPY W/ POLYPECTOMY  1999   Dr Earlean Shawl  . ESOPHAGEAL DILATION  2005   Dr Earlean Shawl  . MASTECTOMY  2009   bilateral, Dr Margot Chimes  . SEPTOPLASTY       Family History  Problem Relation Age of Onset  . Diabetes Mother   . Breast cancer Mother   . Depression Mother        anxiety  . Heart disease Mother        in her 102s  . Colon cancer Sister   . Healthy Daughter   . Stroke Neg Hx      Social History   Substance and Sexual Activity  Drug Use Yes  . Types: Marijuana   Comment: 08/01/2017 last used      Social History   Substance and Sexual Activity  Alcohol Use No     Social History   Tobacco Use  Smoking  Status Former Smoker  . Last attempt to quit: 08/31/1990  . Years since quitting: 27.1  Smokeless Tobacco Never Used  Tobacco Comment   smoked 1971-1992, up to 1 ppd     Outpatient Encounter Medications as of 08/03/2017  Medication Sig  . albuterol (PROAIR HFA) 108 (90 Base) MCG/ACT inhaler Inhale 2 puffs into the lungs every 4 (four) hours as needed for wheezing or shortness of breath.  . budesonide-formoterol (SYMBICORT) 80-4.5 MCG/ACT inhaler Inhale 2 puffs into the lungs 2 (two) times daily.  . cetirizine (ZYRTEC) 10 MG tablet Take 10 mg by mouth daily.  . Probiotic Product (PROBIOTIC PO) Take 1 tablet by mouth daily.  . [DISCONTINUED] citalopram (CELEXA) 40 MG tablet Take 1 tablet (40 mg total) by mouth daily.  . [DISCONTINUED] methocarbamol (ROBAXIN) 750 MG tablet Take 1 tablet (750 mg total) by mouth 2 (two) times daily as needed for muscle spasms.  . [DISCONTINUED] traMADol (ULTRAM) 50 MG tablet TAKE 1 TO 2 TABLETS TWICE A DAY AS NEEDED (Patient not taking: Reported on 09/20/2017)   No facility-administered encounter medications on file as of 08/03/2017.     Allergies: Amoxicillin-pot clavulanate; Doxycycline; and Venlafaxine   ROS   Objective:   Blood pressure 118/70, pulse 76, height 5' (1.524 m), weight 96 lb 11.2 oz (43.9 kg), SpO2 97 %. Body mass index is 18.89 kg/m. General: Well Developed, well nourished, and in no acute distress.  Neuro: Alert and oriented x3, extra-ocular muscles intact, sensation grossly intact.  HEENT:[place]/AT, PERRLA, neck supple, No carotid bruits Skin: no gross rashes  Cardiac: Regular rate and rhythm Respiratory: Essentially clear to auscultation bilaterally. Not using accessory muscles, speaking in full sentences.  Abdominal: not grossly distended Musculoskeletal: Ambulates w/o diff, FROM * 4 ext.  Vasc: less 2 sec cap RF, warm and pink  Psych:  No HI/SI, judgement and insight good, Euthymic mood. Full Affect.    Recent Results (from the  past 2160 hour(s))  Comprehensive metabolic panel  Status: Abnormal   Collection Time: 09/20/17  2:32 PM  Result Value Ref Range   Sodium 139 135 - 145 mmol/L   Potassium 3.2 (L) 3.5 - 5.1 mmol/L   Chloride 107 101 - 111 mmol/L   CO2 23 22 - 32 mmol/L   Glucose, Bld 109 (H) 65 - 99 mg/dL   BUN 14 6 - 20 mg/dL   Creatinine, Ser 0.71 0.44 - 1.00 mg/dL   Calcium 9.6 8.9 - 10.3 mg/dL   Total Protein 8.0 6.5 - 8.1 g/dL   Albumin 4.2 3.5 - 5.0 g/dL   AST 25 15 - 41 U/L   ALT 16 14 - 54 U/L   Alkaline Phosphatase 59 38 - 126 U/L   Total Bilirubin 0.4 0.3 - 1.2 mg/dL   GFR calc non Af Amer >60 >60 mL/min   GFR calc Af Amer >60 >60 mL/min    Comment: (NOTE) The eGFR has been calculated using the CKD EPI equation. This calculation has not been validated in all clinical situations. eGFR's persistently <60 mL/min signify possible Chronic Kidney Disease.    Anion gap 9 5 - 15  Ethanol     Status: None   Collection Time: 09/20/17  2:32 PM  Result Value Ref Range   Alcohol, Ethyl (B) <10 <10 mg/dL    Comment:        LOWEST DETECTABLE LIMIT FOR SERUM ALCOHOL IS 10 mg/dL FOR MEDICAL PURPOSES ONLY   CBC with Diff     Status: None   Collection Time: 09/20/17  2:32 PM  Result Value Ref Range   WBC 6.9 4.0 - 10.5 K/uL   RBC 3.98 3.87 - 5.11 MIL/uL   Hemoglobin 12.3 12.0 - 15.0 g/dL   HCT 36.9 36.0 - 46.0 %   MCV 92.7 78.0 - 100.0 fL   MCH 30.9 26.0 - 34.0 pg   MCHC 33.3 30.0 - 36.0 g/dL   RDW 13.0 11.5 - 15.5 %   Platelets 302 150 - 400 K/uL   Neutrophils Relative % 72 %   Neutro Abs 4.9 1.7 - 7.7 K/uL   Lymphocytes Relative 17 %   Lymphs Abs 1.1 0.7 - 4.0 K/uL   Monocytes Relative 11 %   Monocytes Absolute 0.8 0.1 - 1.0 K/uL   Eosinophils Relative 0 %   Eosinophils Absolute 0.0 0.0 - 0.7 K/uL   Basophils Relative 0 %   Basophils Absolute 0.0 0.0 - 0.1 K/uL  Salicylate level     Status: None   Collection Time: 09/20/17  2:32 PM  Result Value Ref Range   Salicylate Lvl <3.6  2.8 - 30.0 mg/dL  Acetaminophen level     Status: Abnormal   Collection Time: 09/20/17  2:32 PM  Result Value Ref Range   Acetaminophen (Tylenol), Serum <10 (L) 10 - 30 ug/mL    Comment:        THERAPEUTIC CONCENTRATIONS VARY SIGNIFICANTLY. A RANGE OF 10-30 ug/mL MAY BE AN EFFECTIVE CONCENTRATION FOR MANY PATIENTS. HOWEVER, SOME ARE BEST TREATED AT CONCENTRATIONS OUTSIDE THIS RANGE. ACETAMINOPHEN CONCENTRATIONS >150 ug/mL AT 4 HOURS AFTER INGESTION AND >50 ug/mL AT 12 HOURS AFTER INGESTION ARE OFTEN ASSOCIATED WITH TOXIC REACTIONS.   Urine rapid drug screen (hosp performed)     Status: Abnormal   Collection Time: 09/20/17  2:39 PM  Result Value Ref Range   Opiates NONE DETECTED NONE DETECTED   Cocaine NONE DETECTED NONE DETECTED   Benzodiazepines NONE DETECTED NONE DETECTED   Amphetamines NONE DETECTED  NONE DETECTED   Tetrahydrocannabinol POSITIVE (A) NONE DETECTED   Barbiturates NONE DETECTED NONE DETECTED    Comment: (NOTE) DRUG SCREEN FOR MEDICAL PURPOSES ONLY.  IF CONFIRMATION IS NEEDED FOR ANY PURPOSE, NOTIFY LAB WITHIN 5 DAYS. LOWEST DETECTABLE LIMITS FOR URINE DRUG SCREEN Drug Class                     Cutoff (ng/mL) Amphetamine and metabolites    1000 Barbiturate and metabolites    200 Benzodiazepine                 741 Tricyclics and metabolites     300 Opiates and metabolites        300 Cocaine and metabolites        300 THC                            50   Urinalysis, Routine w reflex microscopic     Status: Abnormal   Collection Time: 09/20/17  2:39 PM  Result Value Ref Range   Color, Urine STRAW (A) YELLOW   APPearance CLEAR CLEAR   Specific Gravity, Urine 1.006 1.005 - 1.030   pH 5.0 5.0 - 8.0   Glucose, UA 50 (A) NEGATIVE mg/dL   Hgb urine dipstick MODERATE (A) NEGATIVE   Bilirubin Urine NEGATIVE NEGATIVE   Ketones, ur NEGATIVE NEGATIVE mg/dL   Protein, ur NEGATIVE NEGATIVE mg/dL   Nitrite NEGATIVE NEGATIVE   Leukocytes, UA LARGE (A) NEGATIVE    RBC / HPF 0-5 0 - 5 RBC/hpf   WBC, UA 6-30 0 - 5 WBC/hpf   Bacteria, UA RARE (A) NONE SEEN   Squamous Epithelial / LPF 0-5 (A) NONE SEEN   Mucus PRESENT   CBG monitoring, ED     Status: Abnormal   Collection Time: 09/20/17  2:56 PM  Result Value Ref Range   Glucose-Capillary 101 (H) 65 - 99 mg/dL  Comprehensive metabolic panel     Status: Abnormal   Collection Time: 09/21/17  5:10 AM  Result Value Ref Range   Sodium 140 135 - 145 mmol/L    Comment: REPEATED TO VERIFY   Potassium 4.0 3.5 - 5.1 mmol/L    Comment: REPEATED TO VERIFY DELTA CHECK NOTED NO VISIBLE HEMOLYSIS    Chloride 114 (H) 101 - 111 mmol/L    Comment: REPEATED TO VERIFY   CO2 23 22 - 32 mmol/L    Comment: REPEATED TO VERIFY   Glucose, Bld 95 65 - 99 mg/dL   BUN 8 6 - 20 mg/dL   Creatinine, Ser 0.69 0.44 - 1.00 mg/dL   Calcium 8.5 (L) 8.9 - 10.3 mg/dL   Total Protein 6.4 (L) 6.5 - 8.1 g/dL   Albumin 3.6 3.5 - 5.0 g/dL   AST 21 15 - 41 U/L   ALT 13 (L) 14 - 54 U/L   Alkaline Phosphatase 50 38 - 126 U/L   Total Bilirubin 0.7 0.3 - 1.2 mg/dL   GFR calc non Af Amer >60 >60 mL/min   GFR calc Af Amer >60 >60 mL/min    Comment: (NOTE) The eGFR has been calculated using the CKD EPI equation. This calculation has not been validated in all clinical situations. eGFR's persistently <60 mL/min signify possible Chronic Kidney Disease.    Anion gap 3 (L) 5 - 15    Comment: REPEATED TO VERIFY  CBC     Status: Abnormal   Collection Time:  09/21/17  5:10 AM  Result Value Ref Range   WBC 5.3 4.0 - 10.5 K/uL   RBC 3.44 (L) 3.87 - 5.11 MIL/uL   Hemoglobin 10.4 (L) 12.0 - 15.0 g/dL   HCT 31.9 (L) 36.0 - 46.0 %   MCV 92.7 78.0 - 100.0 fL   MCH 30.2 26.0 - 34.0 pg   MCHC 32.6 30.0 - 36.0 g/dL   RDW 13.2 11.5 - 15.5 %   Platelets 238 150 - 400 K/uL  Magnesium     Status: None   Collection Time: 09/21/17  5:10 AM  Result Value Ref Range   Magnesium 2.3 1.7 - 2.4 mg/dL  Basic metabolic panel     Status: Abnormal    Collection Time: 09/22/17  5:06 AM  Result Value Ref Range   Sodium 139 135 - 145 mmol/L   Potassium 4.1 3.5 - 5.1 mmol/L   Chloride 106 101 - 111 mmol/L   CO2 27 22 - 32 mmol/L   Glucose, Bld 103 (H) 65 - 99 mg/dL   BUN 9 6 - 20 mg/dL   Creatinine, Ser 0.68 0.44 - 1.00 mg/dL   Calcium 9.4 8.9 - 10.3 mg/dL   GFR calc non Af Amer >60 >60 mL/min   GFR calc Af Amer >60 >60 mL/min    Comment: (NOTE) The eGFR has been calculated using the CKD EPI equation. This calculation has not been validated in all clinical situations. eGFR's persistently <60 mL/min signify possible Chronic Kidney Disease.    Anion gap 6 5 - 15

## 2017-08-03 NOTE — Patient Instructions (Addendum)
-Please obtain patient's colonoscopy results from Dr. Dellis Filbert made off of gastroenterology.  Latest results were from [facility] in the chart that I can see.    Please realize, EXERCISE IS MEDICINE!  -  American Heart Association [facility]) guidelines for exercise : If you are in good health, without any medical conditions, you should engage in 150 minutes of moderate intensity aerobic activity per week.  This means you should be huffing and puffing throughout your workout.   Engaging in regular exercise will improve brain function and memory, as well as improve mood, boost immune system and help with weight management.  As well as the other, more well-known effects of exercise such as decreasing blood sugar levels, decreasing blood pressure,  and decreasing bad cholesterol levels/ increasing good cholesterol levels.     -  The AHA strongly endorses consumption of a diet that contains a variety of foods from all the food categories with an emphasis on fruits and vegetables; fat-free and low-fat dairy products; cereal and grain products; legumes and nuts; and fish, poultry, and/or extra lean meats.    Excessive food intake, especially of foods high in saturated and trans fats, sugar, and salt, should be avoided.    Adequate water intake of roughly 1/2 of your weight in pounds, should equal the ounces of water per day you should drink.  So for instance, if you're 200 pounds, that would be 100 ounces of water per day.         Mediterranean Diet  Why follow it? Research shows. . Those who follow the Mediterranean diet have a reduced risk of heart disease  . The diet is associated with a reduced incidence of Parkinson's and Alzheimer's diseases . People following the diet may have longer life expectancies and lower rates of chronic diseases  . The Dietary Guidelines for Americans recommends the Mediterranean diet as an eating plan to promote health and prevent disease  What Is the Mediterranean Diet?   . Healthy eating plan based on typical foods and recipes of Mediterranean-style cooking . The diet is primarily a plant based diet; these foods should make up a majority of meals   Starches - Plant based foods should make up a majority of meals - They are an important sources of vitamins, minerals, energy, antioxidants, and fiber - Choose whole grains, foods high in fiber and minimally processed items  - Typical grain sources include wheat, oats, barley, corn, brown rice, bulgar, farro, millet, polenta, couscous  - Various types of beans include chickpeas, lentils, fava beans, black beans, white beans   Fruits  Veggies - Large quantities of antioxidant rich fruits & veggies; 6 or more servings  - Vegetables can be eaten raw or lightly drizzled with oil and cooked  - Vegetables common to the traditional Mediterranean Diet include: artichokes, arugula, beets, broccoli, brussel sprouts, cabbage, carrots, celery, collard greens, cucumbers, eggplant, kale, leeks, lemons, lettuce, mushrooms, okra, onions, peas, peppers, potatoes, pumpkin, radishes, rutabaga, shallots, spinach, sweet potatoes, turnips, zucchini - Fruits common to the Mediterranean Diet include: apples, apricots, avocados, cherries, clementines, dates, figs, grapefruits, grapes, melons, nectarines, oranges, peaches, pears, pomegranates, strawberries, tangerines  Fats - Replace butter and margarine with healthy oils, such as olive oil, canola oil, and tahini  - Limit nuts to no more than a handful a day  - Nuts include walnuts, almonds, pecans, pistachios, pine nuts  - Limit or avoid candied, honey roasted or heavily salted nuts - Olives are central to the Mediterranean diet -  can be eaten whole or used in a variety of dishes   Meats Protein - Limiting red meat: no more than a few times a month - When eating red meat: choose lean cuts and keep the portion to the size of deck of cards - Eggs: approx. 0 to 4 times a week  - Fish and lean  poultry: at least 2 a week  - Healthy protein sources include, chicken, [place], lean beef, lamb - Increase intake of seafood such as tuna, salmon, trout, mackerel, shrimp, scallops - Avoid or limit high fat processed meats such as sausage and bacon  Dairy - Include moderate amounts of low fat dairy products  - Focus on healthy dairy such as fat free yogurt, skim milk, low or reduced fat cheese - Limit dairy products higher in fat such as whole or 2% milk, cheese, ice cream  Alcohol - Moderate amounts of red wine is ok  - No more than 5 oz daily for women (all ages) and men older than age 65  - No more than 10 oz of wine daily for men younger than 59  Other - Limit sweets and other desserts  - Use herbs and spices instead of salt to flavor foods  - Herbs and spices common to the traditional Mediterranean Diet include: basil, bay leaves, chives, cloves, cumin, fennel, garlic, lavender, marjoram, mint, oregano, parsley, pepper, rosemary, sage, savory, sumac, tarragon, thyme   It's not just a diet, it's a lifestyle:  . The Mediterranean diet includes lifestyle factors typical of those in the region  . Foods, drinks and meals are best eaten with others and savored . Daily physical activity is important for overall good health . This could be strenuous exercise like running and aerobics . This could also be more leisurely activities such as walking, housework, yard-work, or taking the stairs . Moderation is the key; a balanced and healthy diet accommodates most foods and drinks . Consider portion sizes and frequency of consumption of certain foods   Meal Ideas & Options:  . Breakfast:  o Whole wheat toast or whole wheat English muffins with peanut butter & hard boiled egg o Steel cut oats topped with apples & cinnamon and skim milk  o Fresh fruit: banana, strawberries, melon, berries, peaches  o Smoothies: strawberries, bananas, greek yogurt, peanut butter o Low fat greek yogurt with  blueberries and granola  o Egg white omelet with spinach and mushrooms o Breakfast couscous: whole wheat couscous, apricots, skim milk, cranberries  . Sandwiches:  o Hummus and grilled vegetables (peppers, zucchini, squash) on whole wheat bread   o Grilled chicken on whole wheat pita with lettuce, tomatoes, cucumbers or tzatziki  o Tuna salad on whole wheat bread: tuna salad made with greek yogurt, olives, red peppers, capers, green onions o Garlic rosemary lamb pita: lamb sauted with garlic, rosemary, salt & pepper; add lettuce, cucumber, greek yogurt to pita - flavor with lemon juice and black pepper  . Seafood:  o Mediterranean grilled salmon, seasoned with garlic, basil, parsley, lemon juice and black pepper o Shrimp, lemon, and spinach whole-grain pasta salad made with low fat greek yogurt  o Seared scallops with lemon orzo  o Seared tuna steaks seasoned salt, pepper, coriander topped with tomato mixture of olives, tomatoes, olive oil, minced garlic, parsley, green onions and cappers  . Meats:  o Herbed greek chicken salad with kalamata olives, cucumber, feta  o Red bell peppers stuffed with spinach, bulgur, lean ground beef (or lentils) &  topped with feta   o Kebabs: skewers of chicken, tomatoes, onions, zucchini, squash  o [place] burgers: made with red onions, mint, dill, lemon juice, feta cheese topped with roasted red peppers . Vegetarian o Cucumber salad: cucumbers, artichoke hearts, celery, red onion, feta cheese, tossed in olive oil & lemon juice  o Hummus and whole grain pita points with a greek salad (lettuce, tomato, feta, olives, cucumbers, red onion) o Lentil soup with celery, carrots made with vegetable broth, garlic, salt and pepper  o Tabouli salad: parsley, bulgur, mint, scallions, cucumbers, tomato, radishes, lemon juice, olive oil, salt and pepper.      Behavioral Health/ Counseling Referrals    Dr. Tomi Bamberger, PHD Dr. Tomi Bamberger, PHD is a counselor in  [place], [place].  [address] [place], [place] [address] [place] [phone]   Kristie Cowman, [place]  [phone] [email or web address] ( she does [place] and faith-based coaching and counseling )   [employer]- ( faith-based counseling ) Address: [place]. [place], [place] [address] [phone] [address] for appointments [phone] Fax Hours: Monday - Thursday 8:00am-6:00pm Closed for lunch 12-1Thursday only Friday: Closed all day   [facility] Nunzio Cobbs, LCSW, ACSW, M.ED.  -Nunzio Cobbs is a licensed clinical social worker in practice over 35 years and with Dr. Chucky May for the last 10 years.  -She sees adults, adolescents, children & families and couples. -Services are provided for mood and anxiety disorders, marital issues, family or parent/child problems, parenting, co-dependency, gender issues, trauma, grief, and stages of life issues. She also provides critical incident stress debriefing.  -Pamala Hurry accepts many employee assistance programs (EAP), [employer], Pharmacist, hospital.  PHONE  [phone]                FAX [phone]   Rodena Goldmann -[email or web address] [employer]- gen counseling;  PHD   Wilber Oliphant, MSW [address] [place]   [facility] Apolonio Schneiders, PhD [address], [place] [phone]   [facility]- children [address], [place], [place] [phone]   Lanelle Bal Professional Counselor Counseling and [employer] [place] abuse [facility] Manager [address], [place] [phone] [place] [address]  [place] Delmer Islam, PhD Oneida Arenas, PhD Leitha Bleak, LCSW Jillene Bucks, PhD-child, adolescent and adults   [facility] [address], [place] [phone] Merilyn Baba, MS-child, adolescent and adults Lennart Pall, PhD-adolescent and adults   [facility]-grief, terminal illness [place], [place] [address], [place] [facility] [employer] [address], [place], [place]   [facility] [address], [place], [place] [phone]   [facility] [address] [place] [phone]   [facility] [address], [place]   [facility] [address], [address], [place]

## 2017-08-10 ENCOUNTER — Other Ambulatory Visit: Payer: Self-pay

## 2017-08-10 DIAGNOSIS — Z1211 Encounter for screening for malignant neoplasm of colon: Secondary | ICD-10-CM

## 2017-08-10 DIAGNOSIS — Z860100 Personal history of colon polyps, unspecified: Secondary | ICD-10-CM

## 2017-08-10 NOTE — Progress Notes (Signed)
amb  

## 2017-08-19 ENCOUNTER — Other Ambulatory Visit: Payer: Self-pay | Admitting: Rheumatology

## 2017-08-19 NOTE — Telephone Encounter (Signed)
Last Visit: 08/02/17 Next Visit: 02/01/17  Okay to refill per Dr. Estanislado Pandy

## 2017-09-20 ENCOUNTER — Other Ambulatory Visit: Payer: Self-pay

## 2017-09-20 ENCOUNTER — Inpatient Hospital Stay (HOSPITAL_COMMUNITY)
Admission: EM | Admit: 2017-09-20 | Discharge: 2017-09-22 | DRG: 918 | Disposition: A | Discharge status: Other Acute Inpatient Hospital | Payer: Medicare Other | Attending: Internal Medicine | Admitting: Internal Medicine

## 2017-09-20 ENCOUNTER — Encounter (HOSPITAL_COMMUNITY): Payer: Self-pay

## 2017-09-20 DIAGNOSIS — G8929 Other chronic pain: Secondary | ICD-10-CM | POA: Diagnosis not present

## 2017-09-20 DIAGNOSIS — N3001 Acute cystitis with hematuria: Secondary | ICD-10-CM | POA: Diagnosis not present

## 2017-09-20 DIAGNOSIS — J449 Chronic obstructive pulmonary disease, unspecified: Secondary | ICD-10-CM | POA: Diagnosis not present

## 2017-09-20 DIAGNOSIS — Z853 Personal history of malignant neoplasm of breast: Secondary | ICD-10-CM | POA: Diagnosis not present

## 2017-09-20 DIAGNOSIS — F419 Anxiety disorder, unspecified: Secondary | ICD-10-CM | POA: Diagnosis present

## 2017-09-20 DIAGNOSIS — F1211 Cannabis abuse, in remission: Secondary | ICD-10-CM | POA: Diagnosis not present

## 2017-09-20 DIAGNOSIS — M542 Cervicalgia: Secondary | ICD-10-CM | POA: Diagnosis not present

## 2017-09-20 DIAGNOSIS — M797 Fibromyalgia: Secondary | ICD-10-CM | POA: Diagnosis present

## 2017-09-20 DIAGNOSIS — E785 Hyperlipidemia, unspecified: Secondary | ICD-10-CM | POA: Diagnosis present

## 2017-09-20 DIAGNOSIS — E876 Hypokalemia: Secondary | ICD-10-CM | POA: Diagnosis present

## 2017-09-20 DIAGNOSIS — F329 Major depressive disorder, single episode, unspecified: Secondary | ICD-10-CM | POA: Diagnosis present

## 2017-09-20 DIAGNOSIS — F4310 Post-traumatic stress disorder, unspecified: Secondary | ICD-10-CM | POA: Diagnosis not present

## 2017-09-20 DIAGNOSIS — Z90710 Acquired absence of both cervix and uterus: Secondary | ICD-10-CM | POA: Diagnosis not present

## 2017-09-20 DIAGNOSIS — Z62810 Personal history of physical and sexual abuse in childhood: Secondary | ICD-10-CM | POA: Diagnosis not present

## 2017-09-20 DIAGNOSIS — M810 Age-related osteoporosis without current pathological fracture: Secondary | ICD-10-CM | POA: Diagnosis present

## 2017-09-20 DIAGNOSIS — Z87891 Personal history of nicotine dependence: Secondary | ICD-10-CM

## 2017-09-20 DIAGNOSIS — R9431 Abnormal electrocardiogram [ECG] [EKG]: Secondary | ICD-10-CM | POA: Diagnosis not present

## 2017-09-20 DIAGNOSIS — Z818 Family history of other mental and behavioral disorders: Secondary | ICD-10-CM | POA: Diagnosis not present

## 2017-09-20 DIAGNOSIS — F1290 Cannabis use, unspecified, uncomplicated: Secondary | ICD-10-CM | POA: Diagnosis not present

## 2017-09-20 DIAGNOSIS — K589 Irritable bowel syndrome without diarrhea: Secondary | ICD-10-CM | POA: Diagnosis present

## 2017-09-20 DIAGNOSIS — G4700 Insomnia, unspecified: Secondary | ICD-10-CM | POA: Diagnosis not present

## 2017-09-20 DIAGNOSIS — Z630 Problems in relationship with spouse or partner: Secondary | ICD-10-CM | POA: Diagnosis not present

## 2017-09-20 DIAGNOSIS — F331 Major depressive disorder, recurrent, moderate: Secondary | ICD-10-CM

## 2017-09-20 DIAGNOSIS — R51 Headache: Secondary | ICD-10-CM | POA: Diagnosis not present

## 2017-09-20 DIAGNOSIS — Z9013 Acquired absence of bilateral breasts and nipples: Secondary | ICD-10-CM

## 2017-09-20 DIAGNOSIS — T50901A Poisoning by unspecified drugs, medicaments and biological substances, accidental (unintentional), initial encounter: Secondary | ICD-10-CM | POA: Diagnosis present

## 2017-09-20 DIAGNOSIS — Z79899 Other long term (current) drug therapy: Secondary | ICD-10-CM | POA: Diagnosis not present

## 2017-09-20 DIAGNOSIS — F322 Major depressive disorder, single episode, severe without psychotic features: Secondary | ICD-10-CM | POA: Diagnosis not present

## 2017-09-20 DIAGNOSIS — T43592A Poisoning by other antipsychotics and neuroleptics, intentional self-harm, initial encounter: Principal | ICD-10-CM | POA: Diagnosis present

## 2017-09-20 DIAGNOSIS — Z6379 Other stressful life events affecting family and household: Secondary | ICD-10-CM | POA: Diagnosis not present

## 2017-09-20 DIAGNOSIS — T1491XA Suicide attempt, initial encounter: Secondary | ICD-10-CM | POA: Insufficient documentation

## 2017-09-20 DIAGNOSIS — R110 Nausea: Secondary | ICD-10-CM | POA: Diagnosis not present

## 2017-09-20 DIAGNOSIS — K219 Gastro-esophageal reflux disease without esophagitis: Secondary | ICD-10-CM | POA: Diagnosis not present

## 2017-09-20 DIAGNOSIS — Z811 Family history of alcohol abuse and dependence: Secondary | ICD-10-CM | POA: Diagnosis not present

## 2017-09-20 DIAGNOSIS — F39 Unspecified mood [affective] disorder: Secondary | ICD-10-CM | POA: Diagnosis not present

## 2017-09-20 LAB — CBC WITH DIFFERENTIAL/PLATELET
BASOS ABS: 0 10*3/uL (ref 0.0–0.1)
BASOS PCT: 0 %
EOS PCT: 0 %
Eosinophils Absolute: 0 10*3/uL (ref 0.0–0.7)
HCT: 36.9 % (ref 36.0–46.0)
Hemoglobin: 12.3 g/dL (ref 12.0–15.0)
LYMPHS PCT: 17 %
Lymphs Abs: 1.1 10*3/uL (ref 0.7–4.0)
MCH: 30.9 pg (ref 26.0–34.0)
MCHC: 33.3 g/dL (ref 30.0–36.0)
MCV: 92.7 fL (ref 78.0–100.0)
MONO ABS: 0.8 10*3/uL (ref 0.1–1.0)
MONOS PCT: 11 %
Neutro Abs: 4.9 10*3/uL (ref 1.7–7.7)
Neutrophils Relative %: 72 %
PLATELETS: 302 10*3/uL (ref 150–400)
RBC: 3.98 MIL/uL (ref 3.87–5.11)
RDW: 13 % (ref 11.5–15.5)
WBC: 6.9 10*3/uL (ref 4.0–10.5)

## 2017-09-20 LAB — COMPREHENSIVE METABOLIC PANEL
ALBUMIN: 4.2 g/dL (ref 3.5–5.0)
ALT: 16 U/L (ref 14–54)
ANION GAP: 9 (ref 5–15)
AST: 25 U/L (ref 15–41)
Alkaline Phosphatase: 59 U/L (ref 38–126)
BUN: 14 mg/dL (ref 6–20)
CO2: 23 mmol/L (ref 22–32)
Calcium: 9.6 mg/dL (ref 8.9–10.3)
Chloride: 107 mmol/L (ref 101–111)
Creatinine, Ser: 0.71 mg/dL (ref 0.44–1.00)
GFR calc Af Amer: >60 mL/min (ref >60)
GFR calc non Af Amer: >60 mL/min (ref >60)
Glucose, Bld: 109 mg/dL — ABNORMAL HIGH (ref 65–99)
POTASSIUM: 3.2 mmol/L — AB (ref 3.5–5.1)
SODIUM: 139 mmol/L (ref 135–145)
Total Bilirubin: 0.4 mg/dL (ref 0.3–1.2)
Total Protein: 8 g/dL (ref 6.5–8.1)

## 2017-09-20 LAB — URINALYSIS, ROUTINE W REFLEX MICROSCOPIC
Bilirubin Urine: NEGATIVE
Glucose, UA: 50 mg/dL — AB
Ketones, ur: NEGATIVE mg/dL
NITRITE: NEGATIVE
Protein, ur: NEGATIVE mg/dL
SPECIFIC GRAVITY, URINE: 1.006 (ref 1.005–1.030)
pH: 5 (ref 5.0–8.0)

## 2017-09-20 LAB — RAPID URINE DRUG SCREEN, HOSP PERFORMED
Amphetamines: NOT DETECTED
BARBITURATES: NOT DETECTED
BENZODIAZEPINES: NOT DETECTED
COCAINE: NOT DETECTED
Opiates: NOT DETECTED
TETRAHYDROCANNABINOL: POSITIVE — AB

## 2017-09-20 LAB — CBG MONITORING, ED: Glucose-Capillary: 101 mg/dL — ABNORMAL HIGH (ref 65–99)

## 2017-09-20 LAB — ETHANOL: Alcohol, Ethyl (B): <10 mg/dL (ref <10)

## 2017-09-20 LAB — SALICYLATE LEVEL

## 2017-09-20 LAB — ACETAMINOPHEN LEVEL

## 2017-09-20 MED ORDER — MAGNESIUM SULFATE 2 GM/50ML IV SOLN
2.0000 g | Freq: Once | INTRAVENOUS | Status: AC
  Administered 2017-09-20: 2 g via INTRAVENOUS
  Filled 2017-09-20: qty 50

## 2017-09-20 MED ORDER — POTASSIUM CHLORIDE 10 MEQ/100ML IV SOLN: 10.0000 meq | Freq: Once | INTRAVENOUS | Status: DC

## 2017-09-20 MED ORDER — SODIUM CHLORIDE 0.9 % IV BOLUS (SEPSIS)
1000.0000 mL | Freq: Once | INTRAVENOUS | Status: AC
  Administered 2017-09-20: 1000 mL via INTRAVENOUS

## 2017-09-20 MED ORDER — DEXTROSE 5 % IV SOLN
1.0000 g | Freq: Once | INTRAVENOUS | Status: AC
  Administered 2017-09-20: 1 g via INTRAVENOUS
  Filled 2017-09-20: qty 10

## 2017-09-20 MED ORDER — POTASSIUM CHLORIDE 20 MEQ/15ML (10%) PO SOLN
40.0000 meq | Freq: Once | ORAL | Status: AC
  Administered 2017-09-20: 40 meq via ORAL
  Filled 2017-09-20: qty 30

## 2017-09-20 MED ORDER — NITROFURANTOIN MONOHYD MACRO 100 MG PO CAPS
100.0000 mg | ORAL_CAPSULE | Freq: Once | ORAL | Status: AC
  Administered 2017-09-20: 100 mg via ORAL
  Filled 2017-09-20: qty 1

## 2017-09-20 MED ORDER — POTASSIUM CHLORIDE CRYS ER 20 MEQ PO TBCR
40.0000 meq | EXTENDED_RELEASE_TABLET | Freq: Once | ORAL | Status: AC
  Administered 2017-09-20: 40 meq via ORAL
  Filled 2017-09-20: qty 2

## 2017-09-20 MED ORDER — POTASSIUM CHLORIDE 10 MEQ/100ML IV SOLN
10.0000 meq | INTRAVENOUS | Status: AC
  Administered 2017-09-20 – 2017-09-21 (×3): 10 meq via INTRAVENOUS
  Filled 2017-09-20 (×3): qty 100

## 2017-09-20 MED ORDER — DEXTROSE 5 % IV SOLN: 1.0000 g | Freq: Once | INTRAVENOUS | Status: DC

## 2017-09-20 NOTE — ED Notes (Signed)
Pt not tolerating PO K= and spit liquid K+ out.

## 2017-09-20 NOTE — ED Notes (Signed)
Patient is now requesting NO vistiors at this time.

## 2017-09-20 NOTE — ED Notes (Signed)
Pt dtr is at bedside. Pt is unable to tolerate PO meds and continues to vomit.
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
## 2017-09-20 NOTE — ED Notes (Signed)
Pt is alertand orinted x 4 and is verbally responsive. PT became tearful upon asking her why she was trying to harm herself. Pt does admit to feeling hopelesss and recent arguments with her spouse has be a contributing factor per pt.

## 2017-09-20 NOTE — H&P (Signed)
TRH H&P   Patient Demographics:    [name], is a 67 y.o. female  MRN: [id number]   DOB - July 16, 1951  Admit Date - 09/20/2017  Outpatient Primary MD for the patient is Mellody Dance, DO  Referring MD/NP/PA: Margretta Sidle  Outpatient Specialists:   Patient coming from:   home  Chief Complaint  Patient presents with  . Suicide Attempt      HPI:    [name]  is a 67 y.o. female, w Jerrye Bushy, Irritable bowel, Fibromyalgia, who presents with overdose on hydroxyzine.  Pt took 28 tablets. ?700mg  at 12pm.  Pt states she had been feeling depressed x 1 week and attempted to commit suicide per EMS.   In ED,  EKG QTC550  Na 139, K 3.2 Bun 14, Creatinine 0.71 Ast 25, Alt 16  Etoh <10  Wbc 6.9, Hgb 12.3, Plt 302  UDS + thc Urinalysis 6-30 wbc  Pt will be admitted for hydroxyzine overdose and QT prolongation and hypokalemia, and suicide attempt       Review of systems:    In addition to the HPI above,  No Fever-chills, No Headache, No changes with Vision or hearing, No problems swallowing food or Liquids, No Chest pain, Cough or Shortness of Breath, No Abdominal pain, No Nausea or Vommitting, Bowel movements are regular, No Blood in stool or Urine, No dysuria, No new skin rashes or bruises, No new joints pains-aches,  No new weakness, tingling, numbness in any extremity, No recent weight gain or loss, No polyuria, polydypsia or polyphagia, No significant Mental Stressors.  A full 10 point Review of Systems was done, except as stated above, all other Review of Systems were negative.   With Past History of the following :    Past Medical History:  Diagnosis Date  . Anemia    B12 deficient  . B12 deficiency   . Breast cancer ([place])    left breast.  . Chronic SI joint pain    Bilateral, Dr. Estanislado Pandy  . Fibromyalgia    Dr. Estanislado Pandy  . GERD  (gastroesophageal reflux disease)   . Greater trochanteric bursitis of both hips    Dr. Estanislado Pandy  . Hyperlipidemia   . IBS (irritable bowel syndrome)   . LV dysfunction    iatrogenic from chemotherapy ; on Carvedilol   . Migraines   . Osteoporosis    Dr Lomax---> transferring to a new gyn      Past Surgical History:  Procedure Laterality Date  . ABDOMINAL HYSTERECTOMY     BSO for  Endometriosis  . CARPAL TUNNEL RELEASE     left  . CERVICAL DISC SURGERY    . COLONOSCOPY W/ POLYPECTOMY  1999   Dr Earlean Shawl  . ESOPHAGEAL DILATION  2005   Dr Earlean Shawl  . MASTECTOMY  2009   bilateral, Dr Margot Chimes  . SEPTOPLASTY  Social History:     Social History   Tobacco Use  . Smoking status: Former Smoker    Last attempt to quit: 08/31/1990    Years since quitting: 27.0  . Smokeless tobacco: Never Used  . Tobacco comment: smoked 1971-1992, up to 1 ppd  Substance Use Topics  . Alcohol use: No     Lives - at home   Mobility -walks by self   Family History :     Family History  Problem Relation Age of Onset  . Diabetes Mother   . Breast cancer Mother   . Depression Mother        anxiety  . Heart disease Mother        in her 42s  . Colon cancer Sister   . Healthy Daughter   . Stroke Neg Hx       Home Medications:   Prior to Admission medications   Medication Sig Start Date End Date Taking? Authorizing Provider  albuterol (PROAIR HFA) 108 (90 Base) MCG/ACT inhaler Inhale 2 puffs into the lungs every 4 (four) hours as needed for wheezing or shortness of breath. 07/08/16  Yes Paz, Alda Berthold, MD  budesonide-formoterol [facility]) 80-4.5 MCG/ACT inhaler Inhale 2 puffs into the lungs 2 (two) times daily. 03/22/17  Yes Paz, Alda Berthold, MD  cetirizine (ZYRTEC) 10 MG tablet Take 10 mg by mouth daily.   Yes [provider]  citalopram (CELEXA) 40 MG tablet Take 1 tablet (40 mg total) by mouth daily. 07/01/17  Yes Paz, Alda Berthold, MD  methocarbamol (ROBAXIN) 750 MG tablet TAKE 1  TABLET (750 MG TOTAL) BY MOUTH 2 (TWO) TIMES DAILY AS NEEDED FOR MUSCLE SPASMS. 08/19/17  Yes Deveshwar, Abel Presto, MD  Probiotic Product (PROBIOTIC PO) Take 1 tablet by mouth daily.   Yes [provider]  traMADol (ULTRAM) 50 MG tablet TAKE 1 TO 2 TABLETS TWICE A DAY AS NEEDED Patient not taking: Reported on 09/20/2017 11/13/16   Eliezer Lofts, PA-C     Allergies:     Allergies  Allergen Reactions  . Amoxicillin-Pot Clavulanate     diarrhea  . Doxycycline     Nausea & vomiting  . Venlafaxine     ? Reaction; ? Blurred vision     Physical Exam:   Vitals  Blood pressure 113/66, pulse 82, temperature 98.5 F (36.9 C), temperature source Oral, resp. rate 15, height 5' (1.524 m), weight 43.1 kg (95 lb), SpO2 94 %.   1. General  lying in bed in NAD,   2. Normal affect and insight, Not Suicidal or Homicidal, Awake Alert, Oriented X 3.  3. No F.N deficits, ALL C.Nerves Intact, Strength 5/5 all 4 extremities, Sensation intact all 4 extremities, Plantars down going.  4. Ears and Eyes appear Normal, Conjunctivae clear, PERRLA. Moist Oral Mucosa.  5. Supple Neck, No JVD, No cervical lymphadenopathy appriciated, No Carotid Bruits.  6. Symmetrical Chest wall movement, Good air movement bilaterally, CTAB.  7. RRR, No Gallops, Rubs or Murmurs, No Parasternal Heave.  8. Positive Bowel Sounds, Abdomen Soft, No tenderness, No organomegaly appriciated,No rebound -guarding or rigidity.  9.  No Cyanosis, Normal Skin Turgor, No Skin Rash or Bruise.  10. Good muscle tone,  joints appear normal , no effusions, Normal ROM.  11. No Palpable Lymph Nodes in Neck or Axillae     Data Review:    CBC Recent Labs  Lab 09/20/17 1432  WBC 6.9  HGB 12.3  HCT 36.9  PLT 302  MCV  92.7  MCH 30.9  MCHC 33.3  RDW 13.0  LYMPHSABS 1.1  MONOABS 0.8  EOSABS 0.0  BASOSABS 0.0    ------------------------------------------------------------------------------------------------------------------  Chemistries  Recent Labs  Lab 09/20/17 1432  NA 139  K 3.2*  CL 107  CO2 23  GLUCOSE 109*  BUN 14  CREATININE 0.71  CALCIUM 9.6  AST 25  ALT 16  ALKPHOS 59  BILITOT 0.4   ------------------------------------------------------------------------------------------------------------------ estimated creatinine clearance is 46.4 mL/min (by C-G formula based on SCr of 0.71 mg/dL). ------------------------------------------------------------------------------------------------------------------ No results for input(s): TSH, T4TOTAL, T3FREE, THYROIDAB in the last 72 hours.  Invalid input(s): FREET3  Coagulation profile No results for input(s): INR, PROTIME in the last 168 hours. ------------------------------------------------------------------------------------------------------------------- No results for input(s): DDIMER in the last 72 hours. -------------------------------------------------------------------------------------------------------------------  Cardiac Enzymes No results for input(s): CKMB, TROPONINI, MYOGLOBIN in the last 168 hours.  Invalid input(s): CK ------------------------------------------------------------------------------------------------------------------ No results found for: BNP   ---------------------------------------------------------------------------------------------------------------  Urinalysis    Component Value Date/Time   COLORURINE STRAW (A) 09/20/2017 [place] 09/20/2017 1439   LABSPEC 1.006 09/20/2017 1439   PHURINE 5.0 09/20/2017 1439   GLUCOSEU 50 (A) 09/20/2017 1439   GLUCOSEU NEGATIVE 08/12/2006 0922   HGBUR MODERATE (A) 09/20/2017 1439   BILIRUBINUR NEGATIVE 09/20/2017 1439   BILIRUBINUR negative 09/09/2011 1535   KETONESUR NEGATIVE 09/20/2017 1439   PROTEINUR NEGATIVE 09/20/2017 1439    UROBILINOGEN 0.2 09/09/2011 1535   UROBILINOGEN 0.2 08/03/2008 1405   NITRITE NEGATIVE 09/20/2017 1439   LEUKOCYTESUR LARGE (A) 09/20/2017 1439    ----------------------------------------------------------------------------------------------------------------   Imaging Results:    No results found.   Assessment & Plan:    Active Problems:   QT prolongation    Suicide attempt 1:1 direct observation Psychiatry apparently consulted by Ed,  Please request consult in am  Hydroxyzine OD Monitor QT Check ekg in am  Hypokalemia Replete Check cmp in am  UTI Await urine culture Rocephin 1gm iv qday   DVT Prophylaxis Lovenox - SCDs  AM Labs Ordered, also please review Full Orders  Family Communication: Admission, patients condition and plan of care including tests being ordered have been discussed with the patient  who indicate understanding and agree with the plan and Code Status.  Code Status FULL CODE  Likely DC to  home  Condition GUARDED    Consults called: ED contacted psych, please make sure they come in AM  Admission status: inpatient  Time spent in minutes : 45    Jani Gravel M.D on 09/20/2017 at 9:46 PM  Between 7am to 7pm - Pager - [phone]. After 7pm go to [email or web address] - password [facility]  [facility] - Office  [phone]

## 2017-09-20 NOTE — ED Provider Notes (Signed)
[facility] DEPT Provider Note   CSN: [id number] Arrival date & time: 09/20/17  1316     History   Chief Complaint Chief Complaint  Patient presents with  . Suicide Attempt    HPI [name] is a 67 y.o. female with history of breast cancer after chemotherapy and bilateral mastectomies, anxiety, depression, insomnia here for evaluation after ingesting approximately 30 pills of unknown pill. Patient unsure of the name of the medication but states it is her anxiety medication she started taking recently. Per triage note pill thought to be hydroxyzine. Depressive mood and ongoing for a long time but recently worsening. She admits to taking pills in order to kill herself today. She denies homicidal ideation, auditory or visual hallucinations. Has been called EMS.  She denies recent illness, fevers, headache, chest, shortness of breath, cough, abdominal pain, vomiting, diarrhea. Endorses mild nausea and dry mouth.  HPI  Past Medical History:  Diagnosis Date  . Anemia    B12 deficient  . B12 deficiency   . Breast cancer ([place])    left breast.  . Chronic SI joint pain    Bilateral, Dr. Estanislado Pandy  . Fibromyalgia    Dr. Estanislado Pandy  . GERD (gastroesophageal reflux disease)   . Greater trochanteric bursitis of both hips    Dr. Estanislado Pandy  . Hyperlipidemia   . IBS (irritable bowel syndrome)   . LV dysfunction    iatrogenic from chemotherapy ; on Carvedilol   . Migraines   . Osteoporosis    Dr Lomax---> transferring to a new gyn    Patient Active Problem List   Diagnosis Date Noted  . Family history of diabetes mellitus- mom in 50's 2017/08/30    Class: Chronic  . Family history of colon cancer- sister died 56yo 08-30-17  . Adjustment disorder with mixed anxiety < depressed mood 08/30/17  . B12 deficiency 08/30/2017  . Fibromyalgia syndrome 11/23/2016  . Other insomnia 11/23/2016  . Trapezius muscle spasm 11/23/2016  . DDD (degenerative  disc disease), cervical 11/23/2016  . DDD (degenerative disc disease), lumbar 11/23/2016  . Other idiopathic scoliosis, lumbar region 11/23/2016  . Malignant neoplasm of female breast ([place]) 11/23/2016  . History of gastroesophageal reflux (GERD) 11/23/2016  . PCP NOTES >>>>>>>>>>>>>>>>>>>> 11/21/2015  . Constipation 02/15/2015  . Annual physical exam 11/20/2013  . COPD (chronic obstructive pulmonary disease) ([place]) 11/20/2013  . Unspecified adverse effect of unspecified drug, medicinal and biological substance 12/10/2011  . Vitamin D deficiency 07/06/2011  . CARDIOMYOPATHY, PRIMARY, DILATED 01/01/2009  . BREAST CANCER, HX OF 11/02/2008  . h/o HYPERLIPIDEMIA 10/18/2007  . GERD 10/18/2007  . ANEMIA, B12 DEFICIENCY 10/12/2007  . Anxiety,depression, insomnia 10/12/2007  . IBS 10/12/2007  . Primary osteoarthritis of both hands 10/12/2007  . Osteoporosis 10/12/2007  . COLONIC POLYPS, HX OF 10/12/2007  . MIGRAINES, HX OF 10/12/2007  . Fibromyalgia--Dr. Herold Harms, on Ultram 07/14/2007    Past Surgical History:  Procedure Laterality Date  . ABDOMINAL HYSTERECTOMY     BSO for  Endometriosis  . CARPAL TUNNEL RELEASE     left  . CERVICAL DISC SURGERY    . COLONOSCOPY W/ POLYPECTOMY  1999   Dr Earlean Shawl  . ESOPHAGEAL DILATION  2005   Dr Earlean Shawl  . MASTECTOMY  2009   bilateral, Dr Margot Chimes  . SEPTOPLASTY      OB History    No data available       Home Medications    Prior to Admission medications   Medication  Sig Start Date End Date Taking? Authorizing Provider  albuterol (PROAIR HFA) 108 (90 Base) MCG/ACT inhaler Inhale 2 puffs into the lungs every 4 (four) hours as needed for wheezing or shortness of breath. 07/08/16  Yes Paz, Alda Berthold, MD  budesonide-formoterol [facility]) 80-4.5 MCG/ACT inhaler Inhale 2 puffs into the lungs 2 (two) times daily. 03/22/17  Yes Paz, Alda Berthold, MD  cetirizine (ZYRTEC) 10 MG tablet Take 10 mg by mouth daily.   Yes [provider]  citalopram  (CELEXA) 40 MG tablet Take 1 tablet (40 mg total) by mouth daily. 07/01/17  Yes Paz, Alda Berthold, MD  methocarbamol (ROBAXIN) 750 MG tablet TAKE 1 TABLET (750 MG TOTAL) BY MOUTH 2 (TWO) TIMES DAILY AS NEEDED FOR MUSCLE SPASMS. 08/19/17  Yes Deveshwar, Abel Presto, MD  Probiotic Product (PROBIOTIC PO) Take 1 tablet by mouth daily.   Yes [provider]  traMADol (ULTRAM) 50 MG tablet TAKE 1 TO 2 TABLETS TWICE A DAY AS NEEDED Patient not taking: Reported on 09/20/2017 11/13/16   Eliezer Lofts, PA-C    Family History Family History  Problem Relation Age of Onset  . Diabetes Mother   . Breast cancer Mother   . Depression Mother        anxiety  . Heart disease Mother        in her 85s  . Colon cancer Sister   . Healthy Daughter   . Stroke Neg Hx     Social History Social History   Tobacco Use  . Smoking status: Former Smoker    Last attempt to quit: 08/31/1990    Years since quitting: 27.0  . Smokeless tobacco: Never Used  . Tobacco comment: smoked 1971-1992, up to 1 ppd  Substance Use Topics  . Alcohol use: No  . Drug use: Yes    Types: Marijuana    Comment: 08/01/2017 last used      Allergies   Amoxicillin-pot clavulanate; Doxycycline; and Venlafaxine   Review of Systems Review of Systems  Psychiatric/Behavioral: Positive for dysphoric mood, self-injury, sleep disturbance and suicidal ideas.  All other systems reviewed and are negative.    Physical Exam Updated Vital Signs BP 119/65   Pulse 90   Temp 98.4 F (36.9 C) (Oral)   Resp 16   Ht 5' (1.524 m)   Wt 43.1 kg (95 lb)   SpO2 97%   BMI 18.55 kg/m   Physical Exam  Constitutional: She is oriented to person, place, and time. She appears well-developed and well-nourished. No distress.  NAD.  HENT:  Head: Normocephalic and atraumatic.  Right Ear: External ear normal.  Left Ear: External ear normal.  Nose: Nose normal.  Slightly dry mucous membranes  Eyes: Conjunctivae and EOM are normal. No scleral  icterus.  Neck: Normal range of motion. Neck supple.  Cardiovascular: Normal rate, regular rhythm and normal heart sounds.  No murmur heard. No tachycardia  Pulmonary/Chest: Effort normal and breath sounds normal. She has no wheezes.  Abdominal: Soft. There is no tenderness.  No suprapubic or CVAT. No G/R/R.  Musculoskeletal: Normal range of motion.  Neurological: She is alert and oriented to person, place, and time.  Alert and oriented to self, place, time and event.  Speech is fluent without aphasia. Strength 5/5 with hand grip and ankle F/E.   Sensation to light touch intact in hands and feet. No pronator drift.  Normal finger-to-nose.  CN I and VIII not tested. CN II-XII intact bilaterally.   Skin: Skin is warm and  dry. Capillary refill takes less than 2 seconds.  Psychiatric: Her affect is inappropriate. She is withdrawn. She expresses inappropriate judgment. She exhibits a depressed mood.  Verbalizes suicidal thoughts and attempt with ingestion of pills. Denies HI, AVH.   Nursing note and vitals reviewed.    ED Treatments / Results  Labs (all labs ordered are listed, but only abnormal results are displayed) Labs Reviewed  COMPREHENSIVE METABOLIC PANEL - Abnormal; Notable for the following components:      Result Value   Potassium 3.2 (*)    Glucose, Bld 109 (*)    All other components within normal limits  RAPID URINE DRUG SCREEN, HOSP PERFORMED - Abnormal; Notable for the following components:   Tetrahydrocannabinol POSITIVE (*)    All other components within normal limits  URINALYSIS, ROUTINE W REFLEX MICROSCOPIC - Abnormal; Notable for the following components:   Color, Urine STRAW (*)    Glucose, UA 50 (*)    Hgb urine dipstick MODERATE (*)    Leukocytes, UA LARGE (*)    Bacteria, UA RARE (*)    Squamous Epithelial / LPF 0-5 (*)    All other components within normal limits  ACETAMINOPHEN LEVEL - Abnormal; Notable for the following components:   Acetaminophen  (Tylenol), Serum <10 (*)    All other components within normal limits  CBG MONITORING, ED - Abnormal; Notable for the following components:   Glucose-Capillary 101 (*)    All other components within normal limits  ETHANOL  CBC WITH DIFFERENTIAL/PLATELET  SALICYLATE LEVEL    EKG  EKG Interpretation  Date/Time:  Monday September 20 2017 13:49:37 EST Ventricular Rate:  96 PR Interval:    QRS Duration: 77 QT Interval:  435 QTC Calculation: 550 R Axis:   94 Text Interpretation:  Sinus rhythm LAE, consider biatrial enlargement Right axis deviation Borderline T abnormalities, diffuse leads Prolonged QT interval Confirmed by Lacretia Leigh ([id number]) on 09/20/2017 4:00:09 PM       Radiology No results found.  Procedures Procedures (including critical care time)  Medications Ordered in ED Medications  nitrofurantoin (macrocrystal-monohydrate) (MACROBID) capsule 100 mg (not administered)  potassium chloride SA (K-DUR,KLOR-CON) CR tablet 40 mEq (not administered)  sodium chloride 0.9 % bolus 1,000 mL (1,000 mLs Intravenous New Bag/Given 09/20/17 1444)     Initial Impression / Assessment and Plan / ED Course  I have reviewed the triage vital signs and the nursing notes.  Pertinent labs & imaging results that were available during my care of the patient were reviewed by me and considered in my medical decision making (see chart for details).  Clinical Course as of Sep 20 1602  Mon Sep 20, 2017  1548 Hgb urine dipstick: (!) MODERATE [CG]  1548 Leukocytes, UA: (!) LARGE [CG]  1548 Bacteria, UA: (!) RARE [CG]    Clinical Course User Index [CG] Kinnie Feil, PA-C    67 year old here for evaluation of suicidal attempt. She ingested approximately 30 pills of hydroxyzine around noon time today to kill herself. Occasional THC use. She is endorsing mild nausea and dry mouth. No tachycardia, agitation hypertension, seizure-like activity or hallucinations. Slightly dry mucous membranes.  We'll contact poison control for further recommendations. Screening labs pending.  1550: Maryann (RN at poison control) recommends anticholinergic OD management. Monitor for signs including agitation, tachyarrhythmias, HTN, dry mucous membranes and urinary retention, decreased gut motility, seizures and hallucinations for severe OD. Recommending ED management including cardiac monitor, electrolytes, rule out co-ingestion (ASA, APAP, ETOH), hydration and repeat APAP four  hours after ingestion. Hold for a minimum of 6 hr or return to baseline. Can use benzodiazepines for agitation and seizures.  Final Clinical Impressions(s) / ED Diagnoses   8502: Work up remarkable for UTI, first dose of abx given here. Mild hypokalemia, repleted orally. TTS consult in. Will hand off pt to oncoming EDPA who will observe pt for a min of 6 hours in ED. TTS consult ordered. Pt to be discharged with keflex for UTI.  Final diagnoses:  Suicide attempt [facility]  Acute cystitis with hematuria    ED Discharge Orders    None        Arlean Hopping 09/20/17 1604    Lacretia Leigh, MD 09/21/17 [phone]

## 2017-09-20 NOTE — Progress Notes (Signed)
Pt stated it was ok for her daughter to stay in room as she answered questions on nursing admission history.  Lucius Conn BSN, RN-BC Admissions RN 09/20/2017 8:20 PM

## 2017-09-20 NOTE — ED Notes (Signed)
Patient has been changed into gown and placed on the monitor. Patient has also been advised of the suicide procedures. (Sitter currently in room) Patient has agreed to and states that she understands procedures. Patient was also asked about visitors and patient hesitated but then agreed to them. Staff agrees that visitors should be held until EDP can evaluate patient. Patient belongings are with sitter in the room.

## 2017-09-20 NOTE — ED Triage Notes (Signed)
Poison control contact spoke with [facility] Recommendation:Cardiac monitoring, EKG, Draw Tylenol, levels in 4 hours monitor, Hydration for  Anticholinergenic symptoms ( (Anxiety,agitaion, Hypotension, SZ) May treat symptoms with Benzos.

## 2017-09-20 NOTE — ED Provider Notes (Signed)
Received patient at signout from [facility].  Refer to provider note for full history and physical examination.  Briefly, patient is a 67 year old female with a history of breast cancer, anxiety, depression, and insomnia who presents today for evaluation after ingesting approximately 30 pills of what is likely hydroxyzine.  Poison control has made recommendations for observation for 6 hours or until back to baseline.  Found to have prolonged QT and UTI but otherwise no tachycardia or hyperthermia.  She endorses mild dry mouth and nausea but is tolerating p.o. food and fluid without difficulty.  Awaiting TTS consult.  Physical Exam  BP 114/66 (BP Location: Right Arm)   Pulse 95   Temp 98.4 F (36.9 C) (Oral)   Resp 13   Ht 5' (1.524 m)   Wt 43.1 kg (95 lb)   SpO2 95%   BMI 18.55 kg/m   Physical Exam  Constitutional: She appears well-developed and well-nourished. No distress.  HENT:  Head: Normocephalic and atraumatic.  Eyes: Conjunctivae and EOM are normal. Pupils are equal, round, and reactive to light. Right eye exhibits no discharge. Left eye exhibits no discharge.  Neck: No JVD present. No tracheal deviation present.  Cardiovascular: Normal rate, regular rhythm and normal heart sounds.  Pulmonary/Chest: Effort normal and breath sounds normal.  Abdominal: Soft. Bowel sounds are normal. She exhibits no distension. There is no tenderness.  Musculoskeletal: She exhibits no edema.  Neurological: She is alert.  Skin: Skin is warm and dry. No erythema.  Psychiatric: Her speech is normal. She is withdrawn. She exhibits a depressed mood. She expresses suicidal ideation. She expresses suicidal plans.  Does not appear to be responding to internal stimuli, answers my questions with short responses.  Nursing note and vitals reviewed.   ED Course/Procedures   Clinical Course as of Sep 21 1551  Mon Sep 20, 2017  1548 Hgb urine dipstick: (!) MODERATE [CG]  1548 Leukocytes, UA: (!) LARGE [CG]   1548 Bacteria, UA: (!) RARE [CG]    Clinical Course User Index [CG] Kinnie Feil, PA-C    Procedures  MDM  Patient resting comfortably in no apparent distress.  Continue to await TTS recommendations.  Repeat EKG after 6 hours of observation shows further prolonged QT interval with QTC 589.  She has been tolerating p.o. fluids and crackers but has not tolerated p.o. potassium or Macrobid for her UTI.  We will give IV potassium and Rocephin and obtain magnesium level for further evaluation. 7:37 PM Spoke with poison control who recommends replenishing potassium and magnesium to the upper limit of normal and repeating EKG.  8:03 PM Spoke with Dr. Maudie Mercury with [facility] who agrees to assume care of patient and bring her into the hospital for further observation and management.    Renita Papa, PA-C 09/20/17 2326    Duffy Bruce, MD 09/21/17 1200

## 2017-09-21 DIAGNOSIS — T43592A Poisoning by other antipsychotics and neuroleptics, intentional self-harm, initial encounter: Principal | ICD-10-CM

## 2017-09-21 DIAGNOSIS — Z6379 Other stressful life events affecting family and household: Secondary | ICD-10-CM

## 2017-09-21 DIAGNOSIS — M542 Cervicalgia: Secondary | ICD-10-CM

## 2017-09-21 DIAGNOSIS — F331 Major depressive disorder, recurrent, moderate: Secondary | ICD-10-CM

## 2017-09-21 DIAGNOSIS — Z818 Family history of other mental and behavioral disorders: Secondary | ICD-10-CM

## 2017-09-21 DIAGNOSIS — R51 Headache: Secondary | ICD-10-CM

## 2017-09-21 DIAGNOSIS — Z630 Problems in relationship with spouse or partner: Secondary | ICD-10-CM

## 2017-09-21 DIAGNOSIS — F1290 Cannabis use, unspecified, uncomplicated: Secondary | ICD-10-CM

## 2017-09-21 DIAGNOSIS — Z87891 Personal history of nicotine dependence: Secondary | ICD-10-CM

## 2017-09-21 DIAGNOSIS — G4700 Insomnia, unspecified: Secondary | ICD-10-CM

## 2017-09-21 LAB — COMPREHENSIVE METABOLIC PANEL
ALBUMIN: 3.6 g/dL (ref 3.5–5.0)
ALT: 13 U/L — ABNORMAL LOW (ref 14–54)
AST: 21 U/L (ref 15–41)
Alkaline Phosphatase: 50 U/L (ref 38–126)
Anion gap: 3 — ABNORMAL LOW (ref 5–15)
BILIRUBIN TOTAL: 0.7 mg/dL (ref 0.3–1.2)
BUN: 8 mg/dL (ref 6–20)
CHLORIDE: 114 mmol/L — AB (ref 101–111)
CO2: 23 mmol/L (ref 22–32)
Calcium: 8.5 mg/dL — ABNORMAL LOW (ref 8.9–10.3)
Creatinine, Ser: 0.69 mg/dL (ref 0.44–1.00)
GFR calc Af Amer: >60 mL/min (ref >60)
GFR calc non Af Amer: >60 mL/min (ref >60)
GLUCOSE: 95 mg/dL (ref 65–99)
POTASSIUM: 4 mmol/L (ref 3.5–5.1)
Sodium: 140 mmol/L (ref 135–145)
TOTAL PROTEIN: 6.4 g/dL — AB (ref 6.5–8.1)

## 2017-09-21 LAB — CBC
HEMATOCRIT: 31.9 % — AB (ref 36.0–46.0)
Hemoglobin: 10.4 g/dL — ABNORMAL LOW (ref 12.0–15.0)
MCH: 30.2 pg (ref 26.0–34.0)
MCHC: 32.6 g/dL (ref 30.0–36.0)
MCV: 92.7 fL (ref 78.0–100.0)
Platelets: 238 10*3/uL (ref 150–400)
RBC: 3.44 MIL/uL — ABNORMAL LOW (ref 3.87–5.11)
RDW: 13.2 % (ref 11.5–15.5)
WBC: 5.3 10*3/uL (ref 4.0–10.5)

## 2017-09-21 LAB — MAGNESIUM: Magnesium: 2.3 mg/dL (ref 1.7–2.4)

## 2017-09-21 MED ORDER — ACETAMINOPHEN 325 MG PO TABS
650.0000 mg | ORAL_TABLET | Freq: Four times a day (QID) | ORAL | Status: DC | PRN
  Administered 2017-09-21 – 2017-09-22 (×4): 650 mg via ORAL
  Filled 2017-09-21 (×4): qty 2

## 2017-09-21 MED ORDER — POTASSIUM CHLORIDE 10 MEQ/100ML IV SOLN
INTRAVENOUS | Status: AC
  Administered 2017-09-21: 10 meq via INTRAVENOUS
  Filled 2017-09-21: qty 100

## 2017-09-21 MED ORDER — ENOXAPARIN SODIUM 40 MG/0.4ML ~~LOC~~ SOLN
40.0000 mg | Freq: Every day | SUBCUTANEOUS | Status: DC
  Administered 2017-09-21: 40 mg via SUBCUTANEOUS
  Filled 2017-09-21 (×2): qty 0.4

## 2017-09-21 MED ORDER — ENOXAPARIN SODIUM 30 MG/0.3ML ~~LOC~~ SOLN
30.0000 mg | Freq: Every day | SUBCUTANEOUS | Status: DC
  Administered 2017-09-22: 30 mg via SUBCUTANEOUS
  Filled 2017-09-21: qty 0.3

## 2017-09-21 MED ORDER — POTASSIUM CHLORIDE IN NACL 20-0.9 MEQ/L-% IV SOLN
INTRAVENOUS | Status: AC
  Administered 2017-09-21: 04:00:00 via INTRAVENOUS
  Filled 2017-09-21: qty 1000

## 2017-09-21 MED ORDER — ONDANSETRON HCL 4 MG/2ML IJ SOLN
4.0000 mg | Freq: Four times a day (QID) | INTRAMUSCULAR | Status: AC | PRN
  Administered 2017-09-21: 4 mg via INTRAVENOUS
  Filled 2017-09-21: qty 2

## 2017-09-21 MED ORDER — PROCHLORPERAZINE EDISYLATE 5 MG/ML IJ SOLN
5.0000 mg | Freq: Four times a day (QID) | INTRAMUSCULAR | Status: DC | PRN
  Administered 2017-09-21: 5 mg via INTRAVENOUS
  Filled 2017-09-21: qty 2

## 2017-09-21 MED ORDER — CEFTRIAXONE SODIUM 1 G IJ SOLR: 1.0000 g | INTRAMUSCULAR | Status: DC

## 2017-09-21 MED ORDER — ACETAMINOPHEN 650 MG RE SUPP: 650.0000 mg | Freq: Four times a day (QID) | RECTAL | Status: DC | PRN

## 2017-09-21 NOTE — Progress Notes (Addendum)
PROGRESS NOTE    [name]  QMG:[id number] DOB: 1951-06-29 DOA: 09/20/2017 PCP: Mellody Dance, DO  Brief Narrative:[name]  is a 67 y.o. female, w Jerrye Bushy, Irritable bowel, Fibromyalgia, who presented with overdose on hydroxyzine.   patient reportedly took 20 tablets of hydroxyzine around 12 PM in the afternoon 1/21, she reported feeling depressed x 1 week and attempted to commit suicide   Assessment & Plan:   1. Drug overdose/hydroxyzine/suicide attempt  -Poison control recommended repeat 8 EKG in 6 hours for prolonged QTC and replacing magnesium and potassium -Electrolytes replaced, repeat EKG, continue telemetry monitoring -Mentation improved she is alert and awake now will consult psychiatry for recommendations -Diet resumed  2. Fibromyalgia -On PRN tramadol and Robaxin  3. COPD -Stable, nebs when necessary  4. Prolonged Q-T interval on ECG -due to OD, monitor on Tele, avoid QT prolonging meds today  5. asymptomatic bacteriuria -Afebrile, no symptoms at all, no indication for antibiotics will DC ceftriaxone monitor  DVT prophylaxis: lovenox Code Status: FUll Code Family Communication: none at bedside Disposition Plan: likely [facility]  Consultants:   Psych Pending   Procedures:   Antimicrobials:    Subjective: -He feels okay, reports mild nausea  Objective: Vitals:   09/21/17 0500 09/21/17 0627 09/21/17 0628 09/21/17 0700  BP: 109/60 108/68  108/70  Pulse: 67 77  70  Resp: 18 16  (!) 22  Temp:  98.2 F (36.8 C) 98.2 F (36.8 C)   TempSrc:  Oral Oral   SpO2: 95% 97%  96%  Weight: 44.3 kg (97 lb 10.6 oz)     Height:        Intake/Output Summary (Last 24 hours) at 09/21/2017 1226 Last data filed at 09/21/2017 1113 Gross per 24 hour  Intake 1330 ml  Output -  Net 1330 ml   Filed Weights   09/20/17 1318 09/21/17 0500  Weight: 43.1 kg (95 lb) 44.3 kg (97 lb 10.6 oz)    Examination:  General exam: Appears calm and comfortable    Respiratory system: Clear to auscultation Cardiovascular system: S1 & S2 /RRR. No JVD, murmurs or  Abdomen is nondistended, soft and nontender.Normal bowel sounds heard. Central nervous system: Alert and oriented. No focal neurological deficits. Extremities: Symmetric 5 x 5 power. Skin: No rashes, lesions or ulcers Psychiatry: flat affect    Data Reviewed:   CBC: Recent Labs  Lab 09/20/17 1432 09/21/17 0510  WBC 6.9 5.3  NEUTROABS 4.9  --   HGB 12.3 10.4*  HCT 36.9 31.9*  MCV 92.7 92.7  PLT 302 326   Basic Metabolic Panel: Recent Labs  Lab 09/20/17 1432 09/21/17 0510  NA 139 140  K 3.2* 4.0  CL 107 114*  CO2 23 23  GLUCOSE 109* 95  BUN 14 8  CREATININE 0.71 0.69  CALCIUM 9.6 8.5*  MG  --  2.3   GFR: Estimated Creatinine Clearance: 47.7 mL/min (by C-G formula based on SCr of 0.69 mg/dL). Liver Function Tests: Recent Labs  Lab 09/20/17 1432 09/21/17 0510  AST 25 21  ALT 16 13*  ALKPHOS 59 50  BILITOT 0.4 0.7  PROT 8.0 6.4*  ALBUMIN 4.2 3.6   No results for input(s): LIPASE, AMYLASE in the last 168 hours. No results for input(s): AMMONIA in the last 168 hours. Coagulation Profile: No results for input(s): INR, PROTIME in the last 168 hours. Cardiac Enzymes: No results for input(s): CKTOTAL, CKMB, CKMBINDEX, TROPONINI in the last 168 hours. BNP (last 3 results) No results  for input(s): PROBNP in the last 8760 hours. HbA1C: No results for input(s): HGBA1C in the last 72 hours. CBG: Recent Labs  Lab 09/20/17 1456  GLUCAP 101*   Lipid Profile: No results for input(s): CHOL, HDL, LDLCALC, TRIG, CHOLHDL, LDLDIRECT in the last 72 hours. Thyroid Function Tests: No results for input(s): TSH, T4TOTAL, FREET4, T3FREE, THYROIDAB in the last 72 hours. Anemia Panel: No results for input(s): VITAMINB12, FOLATE, FERRITIN, TIBC, IRON, RETICCTPCT in the last 72 hours. Urine analysis:    Component Value Date/Time   COLORURINE STRAW (A) 09/20/2017 1439    APPEARANCEUR CLEAR 09/20/2017 1439   LABSPEC 1.006 09/20/2017 1439   PHURINE 5.0 09/20/2017 1439   GLUCOSEU 50 (A) 09/20/2017 1439   GLUCOSEU NEGATIVE 08/12/2006 0922   HGBUR MODERATE (A) 09/20/2017 1439   BILIRUBINUR NEGATIVE 09/20/2017 1439   BILIRUBINUR negative 09/09/2011 1535   KETONESUR NEGATIVE 09/20/2017 1439   PROTEINUR NEGATIVE 09/20/2017 1439   UROBILINOGEN 0.2 09/09/2011 1535   UROBILINOGEN 0.2 08/03/2008 1405   NITRITE NEGATIVE 09/20/2017 1439   LEUKOCYTESUR LARGE (A) 09/20/2017 1439   Sepsis Labs: @LABRCNTIP (procalcitonin:4,lacticidven:4)  )No results found for this or any previous visit (from the past 240 hour(s)).       Radiology Studies: No results found.      Scheduled Meds: . [START ON 09/22/2017] enoxaparin (LOVENOX) injection  30 mg Subcutaneous Daily   Continuous Infusions: . 0.9 % NaCl with KCl 20 mEq / L 75 mL/hr at 09/21/17 0422  . cefTRIAXone (ROCEPHIN)  IV       LOS: 1 day    Time spent: 15min    Domenic Polite, MD [facility] Page via [email or web address], password [email or web address] After 7PM please contact night-coverage  09/21/2017, 12:26 PM

## 2017-09-21 NOTE — Consult Note (Addendum)
[facility] Consult   Reason for Consult:  Overdose Referring Physician:  Dr. Broadus John Patient Identification: DANESHIA TAVANO MRN:  [id number] Principal Diagnosis: MDD (major depressive disorder), recurrent episode, moderate ([place]) Diagnosis:   Patient Active Problem List   Diagnosis Date Noted  . Prolonged Q-T interval on ECG [R94.31] 09/20/2017  . Overdose [T50.901A] 09/20/2017  . Acute cystitis with hematuria [N30.01]   . Suicide attempt ([place]) [T14.91XA]   . Family history of diabetes mellitus- mom in 86's [Z83.3] 08-05-2017    Class: Chronic  . Family history of colon cancer- sister died 44yo [Z80.0] 2017/08/05  . Adjustment disorder with mixed anxiety < depressed mood [F43.23] 2017/08/05  . B12 deficiency [E53.8] 2017-08-05  . Fibromyalgia syndrome [M79.7] 11/23/2016  . Other insomnia [G47.09] 11/23/2016  . Trapezius muscle spasm [M62.838] 11/23/2016  . DDD (degenerative disc disease), cervical [M50.30] 11/23/2016  . DDD (degenerative disc disease), lumbar [M51.36] 11/23/2016  . Other idiopathic scoliosis, lumbar region [M41.26] 11/23/2016  . Malignant neoplasm of female breast ([place]) [C50.919] 11/23/2016  . History of gastroesophageal reflux (GERD) [Z87.19] 11/23/2016  . PCP NOTES >>>>>>>>>>>>>>>>>>>> [Z09] 11/21/2015  . Constipation [K59.00] 02/15/2015  . Annual physical exam [Z00.00] 11/20/2013  . COPD (chronic obstructive pulmonary disease) ([place]) [J44.9] 11/20/2013  . Unspecified adverse effect of unspecified drug, medicinal and biological substance [T88.7XXA] 12/10/2011  . Vitamin D deficiency [E55.9] 07/06/2011  . CARDIOMYOPATHY, PRIMARY, DILATED [I42.8] 01/01/2009  . BREAST CANCER, HX OF [Z85.3] 11/02/2008  . h/o HYPERLIPIDEMIA [E78.2] 10/18/2007  . GERD [530.1] 10/18/2007  . ANEMIA, B12 DEFICIENCY [D51.8] 10/12/2007  . Anxiety,depression, insomnia [F41.9, F32.9] 10/12/2007  . IBS [K58.9] 10/12/2007  . Primary osteoarthritis of both hands [M19.041,  M19.042] 10/12/2007  . Osteoporosis [M81.0] 10/12/2007  . COLONIC POLYPS, HX OF [Z86.010] 10/12/2007  . MIGRAINES, HX OF [Z87.898] 10/12/2007  . Fibromyalgia--Dr. Herold Harms, on Ultram [M79.7] 07/14/2007    Total Time spent with patient: 1 hour  Subjective:   GER NICKS is a 67 y.o. female patient admitted with overdose on Atarax.  HPI:   Per chart review, patient reports depression for the past week and attempted to commit suicide by overdosing on Atarax (#28). UDS was positive for marijuana. BAL was negative. QTc was prolonged to 589 and now 483. She is prescribed Celexa 40 mg daily.   On interview, Ms. Barnwell reports that she had an argument with her husband and reports that they argue often. She impulsively overdosed on Atarax following the argument. Her husband found her in the kitchen crying and she informed him of the overdose so he called 911. She regrets her overdose. She reports a chronic history of depression. Her mood is "up and down" depending on her current stressors. She reports that a significant stressor is marital discord. She has completed marital counseling in the past but she was terminated by her therapist due to inappropriate behavior. She reports accusing her therapist of lying to her. She additionally reports problems with initiating sleep and multiple nighttime awakenings. She reports fluctuations in appetite which she attributes to chronic neck pain, irritability, hopelessness and helplessness. She denies problems with concentration or anhedonia. She denies a history of decreased need for sleep, euphoria or increased energy. She denies current SI, HI or AVH. She reports compliance with Celexa. She denies recent medication changes.   Patient's husband and daughter were spoken to separately with the patient's permission. They report that she is in a dark place. She is a "hermit and hoarder." She has not followed  up with her doctor's appointment. She had a fall last  week with loss of consciousness. She is not forthcoming with information.    Past Psychiatric History: Depression   Risk to Self: Is patient at risk for suicide?: Yes Risk to Others:  None. Denies. Prior Inpatient Therapy:  Denies  Prior Outpatient Therapy:  Her medications are managed by her PCP.   Past Medical History:  Past Medical History:  Diagnosis Date  . Anemia    B12 deficient  . B12 deficiency   . Breast cancer ([place])    left breast.  . Chronic SI joint pain    Bilateral, Dr. Estanislado Pandy  . Fibromyalgia    Dr. Estanislado Pandy  . GERD (gastroesophageal reflux disease)   . Greater trochanteric bursitis of both hips    Dr. Estanislado Pandy  . Hyperlipidemia   . IBS (irritable bowel syndrome)   . LV dysfunction    iatrogenic from chemotherapy ; on Carvedilol   . Migraines   . Osteoporosis    Dr Lomax---> transferring to a new gyn    Past Surgical History:  Procedure Laterality Date  . ABDOMINAL HYSTERECTOMY     BSO for  Endometriosis  . CARPAL TUNNEL RELEASE     left  . CERVICAL DISC SURGERY    . COLONOSCOPY W/ POLYPECTOMY  1999   Dr Earlean Shawl  . ESOPHAGEAL DILATION  2005   Dr Earlean Shawl  . MASTECTOMY  2009   bilateral, Dr Margot Chimes  . SEPTOPLASTY     Family History:  Family History  Problem Relation Age of Onset  . Diabetes Mother   . Breast cancer Mother   . Depression Mother        anxiety  . Heart disease Mother        in her 6s  . Colon cancer Sister   . Healthy Daughter   . Stroke Neg Hx    Family Psychiatric  History: Mother-depression and brother-BPAD. Social History:  Social History   Substance and Sexual Activity  Alcohol Use No     Social History   Substance and Sexual Activity  Drug Use Yes  . Types: Marijuana   Comment: 08/01/2017 last used     Social History   Socioeconomic History  . Marital status: Married    Spouse name: None  . Number of children: 1  . Years of education: None  . Highest education level: None  Social Needs  . Financial  resource strain: None  . Food insecurity - worry: None  . Food insecurity - inability: None  . Transportation needs - medical: None  . Transportation needs - non-medical: None  Occupational History  . Occupation: retired, Medical sales representative   Tobacco Use  . Smoking status: Former Smoker    Last attempt to quit: 08/31/1990    Years since quitting: 27.0  . Smokeless tobacco: Never Used  . Tobacco comment: smoked 1971-1992, up to 1 ppd  Substance and Sexual Activity  . Alcohol use: No  . Drug use: Yes    Types: Marijuana    Comment: 08/01/2017 last used   . Sexual activity: Not Currently  Other Topics Concern  . None  Social History Narrative   Lives w/ husband   Additional Social History: She lives at home with her husband of 8 years. This is her second marriage. She has a 1 daughter and 2 grandchildren. She reports daily marijuana use for chronic neck pain. She denies alcohol and tobacco use. She quit smoking 26 years ago. She  previously smoked 1 ppd.     Allergies:   Allergies  Allergen Reactions  . Amoxicillin-Pot Clavulanate     diarrhea  . Doxycycline     Nausea & vomiting  . Venlafaxine     ? Reaction; ? Blurred vision    Labs:  Results for orders placed or performed during the hospital encounter of 09/20/17 (from the past 48 hour(s))  Comprehensive metabolic panel     Status: Abnormal   Collection Time: 09/20/17  2:32 PM  Result Value Ref Range   Sodium 139 135 - 145 mmol/L   Potassium 3.2 (L) 3.5 - 5.1 mmol/L   Chloride 107 101 - 111 mmol/L   CO2 23 22 - 32 mmol/L   Glucose, Bld 109 (H) 65 - 99 mg/dL   BUN 14 6 - 20 mg/dL   Creatinine, Ser 0.71 0.44 - 1.00 mg/dL   Calcium 9.6 8.9 - 10.3 mg/dL   Total Protein 8.0 6.5 - 8.1 g/dL   Albumin 4.2 3.5 - 5.0 g/dL   AST 25 15 - 41 U/L   ALT 16 14 - 54 U/L   Alkaline Phosphatase 59 38 - 126 U/L   Total Bilirubin 0.4 0.3 - 1.2 mg/dL   GFR calc non Af Amer >60 >60 mL/min   GFR calc Af Amer >60 >60 mL/min    Comment:  (NOTE) The eGFR has been calculated using the CKD EPI equation. This calculation has not been validated in all clinical situations. eGFR's persistently <60 mL/min signify possible Chronic Kidney Disease.    Anion gap 9 5 - 15  Ethanol     Status: None   Collection Time: 09/20/17  2:32 PM  Result Value Ref Range   Alcohol, Ethyl (B) <10 <10 mg/dL    Comment:        LOWEST DETECTABLE LIMIT FOR SERUM ALCOHOL IS 10 mg/dL FOR MEDICAL PURPOSES ONLY   CBC with Diff     Status: None   Collection Time: 09/20/17  2:32 PM  Result Value Ref Range   WBC 6.9 4.0 - 10.5 K/uL   RBC 3.98 3.87 - 5.11 MIL/uL   Hemoglobin 12.3 12.0 - 15.0 g/dL   HCT 36.9 36.0 - 46.0 %   MCV 92.7 78.0 - 100.0 fL   MCH 30.9 26.0 - 34.0 pg   MCHC 33.3 30.0 - 36.0 g/dL   RDW 13.0 11.5 - 15.5 %   Platelets 302 150 - 400 K/uL   Neutrophils Relative % 72 %   Neutro Abs 4.9 1.7 - 7.7 K/uL   Lymphocytes Relative 17 %   Lymphs Abs 1.1 0.7 - 4.0 K/uL   Monocytes Relative 11 %   Monocytes Absolute 0.8 0.1 - 1.0 K/uL   Eosinophils Relative 0 %   Eosinophils Absolute 0.0 0.0 - 0.7 K/uL   Basophils Relative 0 %   Basophils Absolute 0.0 0.0 - 0.1 K/uL  Salicylate level     Status: None   Collection Time: 09/20/17  2:32 PM  Result Value Ref Range   Salicylate Lvl <0.9 2.8 - 30.0 mg/dL  Acetaminophen level     Status: Abnormal   Collection Time: 09/20/17  2:32 PM  Result Value Ref Range   Acetaminophen (Tylenol), Serum <10 (L) 10 - 30 ug/mL    Comment:        THERAPEUTIC CONCENTRATIONS VARY SIGNIFICANTLY. A RANGE OF 10-30 ug/mL MAY BE AN EFFECTIVE CONCENTRATION FOR MANY PATIENTS. HOWEVER, SOME ARE BEST TREATED AT CONCENTRATIONS OUTSIDE THIS RANGE.  ACETAMINOPHEN CONCENTRATIONS >150 ug/mL AT 4 HOURS AFTER INGESTION AND >50 ug/mL AT 12 HOURS AFTER INGESTION ARE OFTEN ASSOCIATED WITH TOXIC REACTIONS.   Urine rapid drug screen (hosp performed)     Status: Abnormal   Collection Time: 09/20/17  2:39 PM  Result  Value Ref Range   Opiates NONE DETECTED NONE DETECTED   Cocaine NONE DETECTED NONE DETECTED   Benzodiazepines NONE DETECTED NONE DETECTED   Amphetamines NONE DETECTED NONE DETECTED   Tetrahydrocannabinol POSITIVE (A) NONE DETECTED   Barbiturates NONE DETECTED NONE DETECTED    Comment: (NOTE) DRUG SCREEN FOR MEDICAL PURPOSES ONLY.  IF CONFIRMATION IS NEEDED FOR ANY PURPOSE, NOTIFY LAB WITHIN 5 DAYS. LOWEST DETECTABLE LIMITS FOR URINE DRUG SCREEN Drug Class                     Cutoff (ng/mL) Amphetamine and metabolites    1000 Barbiturate and metabolites    200 Benzodiazepine                 169 Tricyclics and metabolites     300 Opiates and metabolites        300 Cocaine and metabolites        300 THC                            50   Urinalysis, Routine w reflex microscopic     Status: Abnormal   Collection Time: 09/20/17  2:39 PM  Result Value Ref Range   Color, Urine STRAW (A) YELLOW   APPearance CLEAR CLEAR   Specific Gravity, Urine 1.006 1.005 - 1.030   pH 5.0 5.0 - 8.0   Glucose, UA 50 (A) NEGATIVE mg/dL   Hgb urine dipstick MODERATE (A) NEGATIVE   Bilirubin Urine NEGATIVE NEGATIVE   Ketones, ur NEGATIVE NEGATIVE mg/dL   Protein, ur NEGATIVE NEGATIVE mg/dL   Nitrite NEGATIVE NEGATIVE   Leukocytes, UA LARGE (A) NEGATIVE   RBC / HPF 0-5 0 - 5 RBC/hpf   WBC, UA 6-30 0 - 5 WBC/hpf   Bacteria, UA RARE (A) NONE SEEN   Squamous Epithelial / LPF 0-5 (A) NONE SEEN   Mucus PRESENT   CBG monitoring, ED     Status: Abnormal   Collection Time: 09/20/17  2:56 PM  Result Value Ref Range   Glucose-Capillary 101 (H) 65 - 99 mg/dL  Comprehensive metabolic panel     Status: Abnormal   Collection Time: 09/21/17  5:10 AM  Result Value Ref Range   Sodium 140 135 - 145 mmol/L    Comment: REPEATED TO VERIFY   Potassium 4.0 3.5 - 5.1 mmol/L    Comment: REPEATED TO VERIFY DELTA CHECK NOTED NO VISIBLE HEMOLYSIS    Chloride 114 (H) 101 - 111 mmol/L    Comment: REPEATED TO VERIFY    CO2 23 22 - 32 mmol/L    Comment: REPEATED TO VERIFY   Glucose, Bld 95 65 - 99 mg/dL   BUN 8 6 - 20 mg/dL   Creatinine, Ser 0.69 0.44 - 1.00 mg/dL   Calcium 8.5 (L) 8.9 - 10.3 mg/dL   Total Protein 6.4 (L) 6.5 - 8.1 g/dL   Albumin 3.6 3.5 - 5.0 g/dL   AST 21 15 - 41 U/L   ALT 13 (L) 14 - 54 U/L   Alkaline Phosphatase 50 38 - 126 U/L   Total Bilirubin 0.7 0.3 - 1.2 mg/dL   GFR calc non  Af Amer >60 >60 mL/min   GFR calc Af Amer >60 >60 mL/min    Comment: (NOTE) The eGFR has been calculated using the CKD EPI equation. This calculation has not been validated in all clinical situations. eGFR's persistently <60 mL/min signify possible Chronic Kidney Disease.    Anion gap 3 (L) 5 - 15    Comment: REPEATED TO VERIFY  CBC     Status: Abnormal   Collection Time: 09/21/17  5:10 AM  Result Value Ref Range   WBC 5.3 4.0 - 10.5 K/uL   RBC 3.44 (L) 3.87 - 5.11 MIL/uL   Hemoglobin 10.4 (L) 12.0 - 15.0 g/dL   HCT 31.9 (L) 36.0 - 46.0 %   MCV 92.7 78.0 - 100.0 fL   MCH 30.2 26.0 - 34.0 pg   MCHC 32.6 30.0 - 36.0 g/dL   RDW 13.2 11.5 - 15.5 %   Platelets 238 150 - 400 K/uL  Magnesium     Status: None   Collection Time: 09/21/17  5:10 AM  Result Value Ref Range   Magnesium 2.3 1.7 - 2.4 mg/dL    Current Facility-Administered Medications  Medication Dose Route Frequency Provider Last Rate Last Dose  . 0.9 % NaCl with KCl 20 mEq/ L  infusion   Intravenous Continuous Jani Gravel, MD 75 mL/hr at 09/21/17 0422    . acetaminophen (TYLENOL) tablet 650 mg  650 mg Oral Q6H PRN Jani Gravel, MD   650 mg at 09/21/17 0147   Or  . acetaminophen (TYLENOL) suppository 650 mg  650 mg Rectal Q6H PRN Jani Gravel, MD      . cefTRIAXone (ROCEPHIN) 1 g in dextrose 5 % 50 mL IVPB  1 g Intravenous Q24H Jani Gravel, MD      . Derrill Memo ON 09/22/2017] enoxaparin (LOVENOX) injection 30 mg  30 mg Subcutaneous Daily Domenic Polite, MD        Musculoskeletal: Strength & Muscle Tone: within normal limits Gait & Station:  UTA since patient is lying in bed. Patient leans: N/A  Psychiatric Specialty Exam: Physical Exam  Nursing note and vitals reviewed. Constitutional: She is oriented to person, place, and time. She appears well-developed and well-nourished.  HENT:  Head: Normocephalic and atraumatic.  Neck: Normal range of motion.  Respiratory: Effort normal.  Musculoskeletal: Normal range of motion.  Neurological: She is alert and oriented to person, place, and time.  Skin: No rash noted.  Psychiatric: Her speech is normal and behavior is normal. Thought content normal. Cognition and memory are normal. She expresses impulsivity. She exhibits a depressed mood.    Review of Systems  Constitutional: Negative for chills and fever.  Gastrointestinal: Positive for nausea. Negative for abdominal pain, constipation, diarrhea and vomiting.  Musculoskeletal: Positive for neck pain.  Neurological: Positive for headaches.  Psychiatric/Behavioral: Positive for depression and substance abuse. Negative for hallucinations and suicidal ideas. The patient has insomnia. The patient is not nervous/anxious.   All other systems reviewed and are negative.   Blood pressure 108/70, pulse 70, temperature 98.2 F (36.8 C), temperature source Oral, resp. rate (!) 22, height 5' (1.524 m), weight 44.3 kg (97 lb 10.6 oz), SpO2 96 %.Body mass index is 19.07 kg/m.  General Appearance: Well Groomed, elderly, Caucasian female with short, gray hair, wearing a hospital and lying in bed. NAD.   Eye Contact:  Good  Speech:  Clear and Coherent and Normal Rate  Volume:  Normal  Mood:  Depressed  Affect:  Congruent  Thought Process:  Goal Directed and Linear  Orientation:  Full (Time, Place, and Person)  Thought Content:  Logical  Suicidal Thoughts:  No  Homicidal Thoughts:  No  Memory:  Immediate;   Good Recent;   Good Remote;   Good  Judgement:  Fair but recently poor.   Insight:  Good  Psychomotor Activity:  Normal   Concentration:  Concentration: Good and Attention Span: Good  Recall:  Good  Fund of Knowledge:  Good  Language:  Good  Akathisia:  No  Handed:  Right  AIMS (if indicated):   N/A  Assets:  Communication Skills Desire for Improvement Housing Social Support  ADL's:  Intact  Cognition:  WNL  Sleep:   Poor   Assessment:  CHANTIA AMALFITANO is a 66 y.o. female who was admitted with overdose by Atarax in the setting of marital discord. She reports chronic depression with positive neurovegetative symptoms. She warrants inpatient psychiatric hospitalization for stabilization and treatment.   Treatment Plan Summary: -Patient warrants inpatient psychiatric hospitalization given high risk of harm to self. -Continue Engineer, materials.  -Hold home Celexa until QTc is normal since it has risk of further prolonging QTc.  -Please pursue involuntary commitment if patient refuses voluntary psychiatric hospitalization or attempts to leave the hospital.  -Will sign off on patient at this time. Please consult psychiatry again as needed.     Disposition: Recommend psychiatric Inpatient admission when medically cleared.  Faythe Dingwall, DO 09/21/2017 12:21 PM

## 2017-09-21 NOTE — Progress Notes (Signed)
Report given to Wells Guiles, RN and pt transported to 1w. Pt stable at time of transfer.

## 2017-09-21 NOTE — Progress Notes (Signed)
Patient arrived on unit via bed. No family at bedside.  Telemetry placed per MD order and CMT notified.

## 2017-09-21 NOTE — Progress Notes (Signed)
Repeat EKG results called into Patty with Poison Control.  Per Patty they will close out case and to call if needed.

## 2017-09-21 NOTE — Progress Notes (Signed)
Barnett Applebaum, RN with [facility] Control, called and requested another EKG.  [phone]  Dr. Broadus John notified.

## 2017-09-21 NOTE — Progress Notes (Signed)
   09/21/17 1000  Clinical Encounter Type  Visited With Patient  Visit Type Initial;Psychological support;Spiritual support  Referral From Nurse  Consult/Referral To Chaplain  Spiritual Encounters  Spiritual Needs Emotional;Other (Comment) (Spiritual Care conversation/Support)  Stress Factors  Patient Stress Factors None identified   I visited with the patient per Spiritual Care consult.  The patient had a very flat affect during my brief visit. She did not state any needs for Spiritual Care support at this time.  Please, contact Spiritual Care for further assistance.   [place] , [facility]

## 2017-09-22 ENCOUNTER — Inpatient Hospital Stay (HOSPITAL_COMMUNITY)
Admission: AD | Admit: 2017-09-22 | Discharge: 2017-09-25 | DRG: 885 | Disposition: A | Discharge status: Home / Self Care | Payer: Medicare Other | Source: Intra-hospital | Attending: Psychiatry | Admitting: Psychiatry

## 2017-09-22 ENCOUNTER — Encounter (HOSPITAL_COMMUNITY): Payer: Self-pay | Admitting: *Deleted

## 2017-09-22 ENCOUNTER — Other Ambulatory Visit: Payer: Self-pay

## 2017-09-22 DIAGNOSIS — E785 Hyperlipidemia, unspecified: Secondary | ICD-10-CM | POA: Diagnosis present

## 2017-09-22 DIAGNOSIS — Z79899 Other long term (current) drug therapy: Secondary | ICD-10-CM

## 2017-09-22 DIAGNOSIS — Z8249 Family history of ischemic heart disease and other diseases of the circulatory system: Secondary | ICD-10-CM

## 2017-09-22 DIAGNOSIS — Z881 Allergy status to other antibiotic agents status: Secondary | ICD-10-CM | POA: Diagnosis not present

## 2017-09-22 DIAGNOSIS — Z833 Family history of diabetes mellitus: Secondary | ICD-10-CM

## 2017-09-22 DIAGNOSIS — Z9221 Personal history of antineoplastic chemotherapy: Secondary | ICD-10-CM | POA: Diagnosis not present

## 2017-09-22 DIAGNOSIS — M810 Age-related osteoporosis without current pathological fracture: Secondary | ICD-10-CM | POA: Diagnosis present

## 2017-09-22 DIAGNOSIS — G4700 Insomnia, unspecified: Secondary | ICD-10-CM | POA: Diagnosis present

## 2017-09-22 DIAGNOSIS — Z853 Personal history of malignant neoplasm of breast: Secondary | ICD-10-CM

## 2017-09-22 DIAGNOSIS — Z7951 Long term (current) use of inhaled steroids: Secondary | ICD-10-CM

## 2017-09-22 DIAGNOSIS — E559 Vitamin D deficiency, unspecified: Secondary | ICD-10-CM | POA: Diagnosis present

## 2017-09-22 DIAGNOSIS — T43592A Poisoning by other antipsychotics and neuroleptics, intentional self-harm, initial encounter: Secondary | ICD-10-CM | POA: Diagnosis not present

## 2017-09-22 DIAGNOSIS — K219 Gastro-esophageal reflux disease without esophagitis: Secondary | ICD-10-CM | POA: Diagnosis not present

## 2017-09-22 DIAGNOSIS — Z888 Allergy status to other drugs, medicaments and biological substances status: Secondary | ICD-10-CM

## 2017-09-22 DIAGNOSIS — F322 Major depressive disorder, single episode, severe without psychotic features: Secondary | ICD-10-CM | POA: Diagnosis present

## 2017-09-22 DIAGNOSIS — F39 Unspecified mood [affective] disorder: Secondary | ICD-10-CM | POA: Diagnosis not present

## 2017-09-22 DIAGNOSIS — Z87891 Personal history of nicotine dependence: Secondary | ICD-10-CM | POA: Diagnosis not present

## 2017-09-22 DIAGNOSIS — F4310 Post-traumatic stress disorder, unspecified: Secondary | ICD-10-CM | POA: Diagnosis not present

## 2017-09-22 DIAGNOSIS — Z915 Personal history of self-harm: Secondary | ICD-10-CM

## 2017-09-22 DIAGNOSIS — Z9013 Acquired absence of bilateral breasts and nipples: Secondary | ICD-10-CM

## 2017-09-22 DIAGNOSIS — F1211 Cannabis abuse, in remission: Secondary | ICD-10-CM | POA: Diagnosis not present

## 2017-09-22 DIAGNOSIS — K589 Irritable bowel syndrome without diarrhea: Secondary | ICD-10-CM | POA: Diagnosis present

## 2017-09-22 DIAGNOSIS — H9193 Unspecified hearing loss, bilateral: Secondary | ICD-10-CM | POA: Diagnosis present

## 2017-09-22 DIAGNOSIS — F1290 Cannabis use, unspecified, uncomplicated: Secondary | ICD-10-CM | POA: Diagnosis not present

## 2017-09-22 DIAGNOSIS — F331 Major depressive disorder, recurrent, moderate: Secondary | ICD-10-CM | POA: Diagnosis present

## 2017-09-22 DIAGNOSIS — Z803 Family history of malignant neoplasm of breast: Secondary | ICD-10-CM

## 2017-09-22 DIAGNOSIS — F332 Major depressive disorder, recurrent severe without psychotic features: Principal | ICD-10-CM | POA: Diagnosis present

## 2017-09-22 DIAGNOSIS — J449 Chronic obstructive pulmonary disease, unspecified: Secondary | ICD-10-CM | POA: Diagnosis present

## 2017-09-22 DIAGNOSIS — E876 Hypokalemia: Secondary | ICD-10-CM | POA: Diagnosis not present

## 2017-09-22 DIAGNOSIS — Z62810 Personal history of physical and sexual abuse in childhood: Secondary | ICD-10-CM | POA: Diagnosis not present

## 2017-09-22 DIAGNOSIS — M797 Fibromyalgia: Secondary | ICD-10-CM | POA: Diagnosis present

## 2017-09-22 DIAGNOSIS — R45851 Suicidal ideations: Secondary | ICD-10-CM | POA: Diagnosis present

## 2017-09-22 DIAGNOSIS — F329 Major depressive disorder, single episode, unspecified: Secondary | ICD-10-CM | POA: Diagnosis present

## 2017-09-22 DIAGNOSIS — Z818 Family history of other mental and behavioral disorders: Secondary | ICD-10-CM

## 2017-09-22 DIAGNOSIS — T1491XA Suicide attempt, initial encounter: Secondary | ICD-10-CM | POA: Diagnosis not present

## 2017-09-22 DIAGNOSIS — F419 Anxiety disorder, unspecified: Secondary | ICD-10-CM | POA: Diagnosis present

## 2017-09-22 DIAGNOSIS — Z811 Family history of alcohol abuse and dependence: Secondary | ICD-10-CM | POA: Diagnosis not present

## 2017-09-22 LAB — BASIC METABOLIC PANEL
Anion gap: 6 (ref 5–15)
BUN: 9 mg/dL (ref 6–20)
CO2: 27 mmol/L (ref 22–32)
Calcium: 9.4 mg/dL (ref 8.9–10.3)
Chloride: 106 mmol/L (ref 101–111)
Creatinine, Ser: 0.68 mg/dL (ref 0.44–1.00)
GFR calc Af Amer: >60 mL/min (ref >60)
GLUCOSE: 103 mg/dL — AB (ref 65–99)
POTASSIUM: 4.1 mmol/L (ref 3.5–5.1)
Sodium: 139 mmol/L (ref 135–145)

## 2017-09-22 MED ORDER — ALUM & MAG HYDROXIDE-SIMETH 200-200-20 MG/5ML PO SUSP: 30.0000 mL | ORAL | Status: DC | PRN

## 2017-09-22 MED ORDER — MOMETASONE FURO-FORMOTEROL FUM 100-5 MCG/ACT IN AERO
2.0000 | INHALATION_SPRAY | Freq: Two times a day (BID) | RESPIRATORY_TRACT | Status: DC
  Administered 2017-09-22 – 2017-09-25 (×5): 2 via RESPIRATORY_TRACT
  Filled 2017-09-22 (×2): qty 8.8

## 2017-09-22 MED ORDER — ALBUTEROL SULFATE HFA 108 (90 BASE) MCG/ACT IN AERS
2.0000 | INHALATION_SPRAY | RESPIRATORY_TRACT | Status: DC | PRN
  Filled 2017-09-22: qty 6.7

## 2017-09-22 MED ORDER — METHOCARBAMOL 750 MG PO TABS: 750.0000 mg | ORAL_TABLET | Freq: Two times a day (BID) | ORAL | Status: DC | PRN

## 2017-09-22 MED ORDER — MAGNESIUM HYDROXIDE 400 MG/5ML PO SUSP: 30.0000 mL | Freq: Every day | ORAL | Status: DC | PRN

## 2017-09-22 MED ORDER — LORATADINE 10 MG PO TABS
10.0000 mg | ORAL_TABLET | Freq: Every day | ORAL | Status: DC
  Administered 2017-09-22 – 2017-09-25 (×4): 10 mg via ORAL
  Filled 2017-09-22 (×9): qty 1

## 2017-09-22 MED ORDER — CITALOPRAM HYDROBROMIDE 40 MG PO TABS
40.0000 mg | ORAL_TABLET | Freq: Every day | ORAL | Status: DC
  Administered 2017-09-22 – 2017-09-25 (×4): 40 mg via ORAL
  Filled 2017-09-22: qty 1
  Filled 2017-09-22: qty 2
  Filled 2017-09-22: qty 1
  Filled 2017-09-22: qty 2
  Filled 2017-09-22 (×4): qty 1
  Filled 2017-09-22 (×2): qty 2

## 2017-09-22 NOTE — Progress Notes (Signed)
D: Pt was in her room upon initial approach.  Pt presents with anxious affect and mood.  She reports her day has been "alright" and verbalizes that she would like to discharge.  Pt encouraged to discuss potential discharge date with provider tomorrow.  Pt denies SI/HI, denies hallucinations, reports chronic neck pain of 5/10.  Pt has been in her room for the majority of the night.    A: Introduced self to pt.  Actively listened to pt and offered support and encouragement. Medication administered per order.  Q15 minute safety checks maintained.  PRN pain medication offered, pt declined.  R: Pt is safe on the unit.  Pt is compliant with scheduled medication.  Pt verbally contracts for safety.  Will continue to monitor and assess.

## 2017-09-22 NOTE — Clinical Social Work Note (Addendum)
Clinical Social Work Assessment  Patient Details  Name: [name] MRN: [id number] Date of Birth: 1951/05/10  Date of referral:  09/22/17               Reason for consult:  Facility Placement(inpatient psych)                Permission sought to share information with:  Case Manager, Facility Sport and exercise psychologist, Family Supports Permission granted to share information::  Yes, Verbal Permission Granted  Name::     Risk manager::  Inpatient psych facilities  Relationship::  husband  Contact Information:     Housing/Transportation Living arrangements for the past 2 months:  Single Family Home Source of Information:  Patient, Spouse Patient Interpreter Needed:  None Criminal Activity/Legal Involvement Pertinent to Current Situation/Hospitalization:  No - Comment as needed Significant Relationships:  Adult Children, Spouse Lives with:  Spouse Do you feel safe going back to the place where you live?  Yes Need for family participation in patient care:  Yes (Comment)  Care giving concerns:  No care giving concerns at the time of assessment.    Social Worker assessment / plan:  LCSW following for inpatient psych placement.   Patient admitted for intentional OD.  Patient reported taking pills after an argument with her spouse.   Patient has a history of depression.   LCSW met with patient at bedside. Patieint has a Actuary. Patient's spouse present.   Patient reports that she is not followed in the community by a psychiatrist or therapist. Patient states that her meds are prescribed by her PCP.   Patient reports that she has never been inpatient before.  Patient reports that she was seeing a therapist, but they discontinued services. She has been looking for a new thereapist. Patient also changed PCPs recently.   Patient is willing to go inpatient voluntarily.   PLAN: Patient will go to [facility] when medically stable. Patient under review at [facility].    Employment status:   Retired Forensic scientist:  [employer] PT Recommendations:  Not assessed at this time Information / Referral to community resources:  [place] (Comment Required)  Patient/Family's Response to care:  No concerns at the time of assessment.   Patient/Family's Understanding of and Emotional Response to Diagnosis, Current Treatment, and Prognosis:  Patient and family understanding of current diagnosis and agreeable to treatment plan   Emotional Assessment Appearance:  Appears stated age Attitude/Demeanor/Rapport:    Affect (typically observed):  Calm, Pleasant Orientation:  Oriented to Self, Oriented to Place, Oriented to  Time, Oriented to Situation Alcohol / Substance use:  Not Applicable Psych involvement (Current and /or in the community):  Outpatient Provider  Discharge Needs  Concerns to be addressed:  Mental Health Concerns Readmission within the last 30 days:  No Current discharge risk:  None Barriers to Discharge:  Psych Bed not available   Servando Snare, LCSW 09/22/2017, 10:35 AM

## 2017-09-22 NOTE — Progress Notes (Addendum)
LCSW following for inpatient psych placement.  Patient will go to [facility]. Room 400 bed 1. Attending: Mariea Clonts Accepting: [place]  Patient will transport by Betsy Pries. Transport set up at 11:46am. Patient to be picked up at 2:45pm  Please call report before patient leaves.  RN report number: [phone]  Servando Snare, Shawna Clamp [place] [phone]

## 2017-09-22 NOTE — Progress Notes (Signed)
Patient ambulated 2 laps around unit unassisted with NT.

## 2017-09-22 NOTE — Progress Notes (Signed)
[name] is a 67 year old female pt admitted on voluntary basis. On admission, [name] endorsed overdose and spoke about argument with husband as reason. She spoke about how they have been married for 27 years and fight all the time. She reports that they have been to marriage counseling together but reports that she got kicked out by her therapist. She endorses anger issues and spoke about how she needs help with that area in her life. She reports that she takes all her medications as prescribed and reports that she uses marijuana to help with pain in her neck. She denies any other substance abuse issue. She reports that she lives with her husband and reports that she will go back there after discharge. She denies any SI on admission and is able to contract for safety while in the hospital. She was oriented to the unit and safety maintained.

## 2017-09-22 NOTE — Progress Notes (Signed)
LCSW following for inpatient psych placement.   Patient will go to inpatient psych when medically stable.   LCSW will continue to follow.

## 2017-09-22 NOTE — Tx Team (Signed)
Initial Treatment Plan 09/22/2017 5:22 PM [name] LZJ:[id number]    PATIENT STRESSORS: Health problems Marital or family conflict   PATIENT STRENGTHS: Ability for insight Average or above average intelligence Capable of independent living General fund of knowledge Motivation for treatment/growth Supportive family/friends   PATIENT IDENTIFIED PROBLEMS: Depression Anger Marriage problems Suicidal thoughts "Anger management issues"                     DISCHARGE CRITERIA:  Ability to meet basic life and health needs Improved stabilization in mood, thinking, and/or behavior Reduction of life-threatening or endangering symptoms to within safe limits Verbal commitment to aftercare and medication compliance  PRELIMINARY DISCHARGE PLAN: Attend aftercare/continuing care group Return to previous living arrangement  PATIENT/FAMILY INVOLVEMENT: This treatment plan has been presented to and reviewed with the patient, [name], and/or family member, .  The patient and family have been given the opportunity to ask questions and make suggestions.  [place], [place], [place] 09/22/2017, 5:22 PM

## 2017-09-22 NOTE — Discharge Summary (Signed)
Physician Discharge Summary  [name]:[id number] DOB: August 06, 1951 DOA: 09/20/2017  PCP: Mellody Dance, DO  Admit date: 09/20/2017 Discharge date: 09/22/2017  Admitted From: home Disposition:  [facility]  Recommendations for Outpatient Follow-up:  1. Follow up with PCP in 1-2 weeks  Home Health: none Equipment/Devices: none  Discharge Condition: stable CODE STATUS: Full code Diet recommendation: regular  HPI: Per Dr. Maudie Mercury, [name]  is a 67 y.o. female, w Jerrye Bushy, Irritable bowel, Fibromyalgia, who presents with overdose on hydroxyzine.  Pt took 28 tablets. ?700mg  at 12pm.  Pt states she had been feeling depressed x 1 week and attempted to commit suicide per EMS.  In ED,  EKG QTC550 Na 139, K 3.2 Bun 14, Creatinine 0.71 Ast 25, Alt 16 Etoh <10 Wbc 6.9, Hgb 12.3, Plt 302 UDS + thc Urinalysis 6-30 wbc  Pt will be admitted for hydroxyzine overdose and QT prolongation and hypokalemia, and suicide attempt   Hospital Course: Drug overdose/hydroxyzine/suicide attempt - patient was admitted to the hospital with hydroxyzine overdose and suicide attempt. She was found to have prolonged QT interval. Poison control recommended repeat EKG in 6 hours for prolonged QTC and replacing magnesium and potassium. Her Qt was monitored and has now normalized. She has no events/arrhythmias on telemetry. Her Magnesium was normal, her K was replaced and has now normalized. Psychiatry was consulted and has recommended inpatient psychiatryc hospitalization. She is medically stable for discharge to be transferred to inpatient psych. Recommend repeat a BMP in 2-3 days to ensure stability.  Fibromyalgia -resume home medications COPD -Stable, no wheezing Prolonged Q-T interval on ECG -resolved Asymptomatic bacteriuria -Afebrile, no symptoms at all, no indication for antibiotics   Discharge Diagnoses:  Principal Problem:   MDD (major depressive disorder), recurrent episode, moderate (HCC) Active  Problems:   Prolonged Q-T interval on ECG   Overdose   Discharge Instructions   Allergies as of 09/22/2017      Reactions   Amoxicillin-pot Clavulanate    diarrhea   Doxycycline    Nausea & vomiting   Venlafaxine    ? Reaction; ? Blurred vision      Medication List    STOP taking these medications   citalopram 40 MG tablet Commonly known as:  CELEXA     TAKE these medications   albuterol 108 (90 Base) MCG/ACT inhaler Commonly known as:  PROAIR HFA Inhale 2 puffs into the lungs every 4 (four) hours as needed for wheezing or shortness of breath.   budesonide-formoterol 80-4.5 MCG/ACT inhaler Commonly known as:  SYMBICORT Inhale 2 puffs into the lungs 2 (two) times daily.   cetirizine 10 MG tablet Commonly known as:  ZYRTEC Take 10 mg by mouth daily.   methocarbamol 750 MG tablet Commonly known as:  ROBAXIN TAKE 1 TABLET (750 MG TOTAL) BY MOUTH 2 (TWO) TIMES DAILY AS NEEDED FOR MUSCLE SPASMS.   PROBIOTIC PO Take 1 tablet by mouth daily.   traMADol 50 MG tablet Commonly known as:  ULTRAM TAKE 1 TO 2 TABLETS TWICE A DAY AS NEEDED        Consultations:  Psychiatry   Procedures/Studies:   No results found.   Subjective: - no chest pain, shortness of breath, no abdominal pain, nausea or vomiting.   Discharge Exam: Vitals:   09/22/17 0049 09/22/17 0514  BP: 121/79 114/71  Pulse: 70 72  Resp:  18  Temp:  97.9 F (36.6 C)  SpO2:  95%    General: Pt is alert, awake, not  in acute distress Cardiovascular: RRR, S1/S2 +, no rubs, no gallops Respiratory: CTA bilaterally, no wheezing, no rhonchi   The results of significant diagnostics from this hospitalization (including imaging, microbiology, ancillary and laboratory) are listed below for reference.     Microbiology: No results found for this or any previous visit (from the past 240 hour(s)).   Labs: BNP (last 3 results) No results for input(s): BNP in the last 8760 hours. Basic Metabolic  Panel: Recent Labs  Lab 09/20/17 1432 09/21/17 0510 09/22/17 0506  NA 139 140 139  K 3.2* 4.0 4.1  CL 107 114* 106  CO2 23 23 27   GLUCOSE 109* 95 103*  BUN 14 8 9   CREATININE 0.71 0.69 0.68  CALCIUM 9.6 8.5* 9.4  MG  --  2.3  --    Liver Function Tests: Recent Labs  Lab 09/20/17 1432 09/21/17 0510  AST 25 21  ALT 16 13*  ALKPHOS 59 50  BILITOT 0.4 0.7  PROT 8.0 6.4*  ALBUMIN 4.2 3.6   No results for input(s): LIPASE, AMYLASE in the last 168 hours. No results for input(s): AMMONIA in the last 168 hours. CBC: Recent Labs  Lab 09/20/17 1432 09/21/17 0510  WBC 6.9 5.3  NEUTROABS 4.9  --   HGB 12.3 10.4*  HCT 36.9 31.9*  MCV 92.7 92.7  PLT 302 238   Cardiac Enzymes: No results for input(s): CKTOTAL, CKMB, CKMBINDEX, TROPONINI in the last 168 hours. BNP: Invalid input(s): POCBNP CBG: Recent Labs  Lab 09/20/17 1456  GLUCAP 101*   D-Dimer No results for input(s): DDIMER in the last 72 hours. Hgb A1c No results for input(s): HGBA1C in the last 72 hours. Lipid Profile No results for input(s): CHOL, HDL, LDLCALC, TRIG, CHOLHDL, LDLDIRECT in the last 72 hours. Thyroid function studies No results for input(s): TSH, T4TOTAL, T3FREE, THYROIDAB in the last 72 hours.  Invalid input(s): FREET3 Anemia work up No results for input(s): VITAMINB12, FOLATE, FERRITIN, TIBC, IRON, RETICCTPCT in the last 72 hours. Urinalysis    Component Value Date/Time   COLORURINE STRAW (A) 09/20/2017 1439   APPEARANCEUR CLEAR 09/20/2017 1439   LABSPEC 1.006 09/20/2017 1439   PHURINE 5.0 09/20/2017 1439   GLUCOSEU 50 (A) 09/20/2017 1439   GLUCOSEU NEGATIVE 08/12/2006 0922   HGBUR MODERATE (A) 09/20/2017 1439   BILIRUBINUR NEGATIVE 09/20/2017 1439   BILIRUBINUR negative 09/09/2011 1535   KETONESUR NEGATIVE 09/20/2017 1439   PROTEINUR NEGATIVE 09/20/2017 1439   UROBILINOGEN 0.2 09/09/2011 1535   UROBILINOGEN 0.2 08/03/2008 1405   NITRITE NEGATIVE 09/20/2017 1439   LEUKOCYTESUR  LARGE (A) 09/20/2017 1439   Sepsis Labs Invalid input(s): PROCALCITONIN,  WBC,  LACTICIDVEN   Time coordinating discharge: 25 minutes  SIGNED:  Marzetta Board, MD  [facility] 09/22/2017, 10:11 AM Pager [phone]  If 7PM-7AM, please contact night-coverage [email or web address] Password [email or web address]

## 2017-09-22 NOTE — Progress Notes (Signed)
Report called to [facility] at [facility].  All questions answered.

## 2017-09-22 NOTE — [facility] Group Notes (Signed)
Pt did not attend wrap up group this evening. Pt stayed in their room instead 

## 2017-09-23 DIAGNOSIS — F419 Anxiety disorder, unspecified: Secondary | ICD-10-CM

## 2017-09-23 DIAGNOSIS — Z811 Family history of alcohol abuse and dependence: Secondary | ICD-10-CM

## 2017-09-23 DIAGNOSIS — F322 Major depressive disorder, single episode, severe without psychotic features: Secondary | ICD-10-CM

## 2017-09-23 DIAGNOSIS — T1491XA Suicide attempt, initial encounter: Secondary | ICD-10-CM

## 2017-09-23 DIAGNOSIS — Z62810 Personal history of physical and sexual abuse in childhood: Secondary | ICD-10-CM

## 2017-09-23 DIAGNOSIS — F4310 Post-traumatic stress disorder, unspecified: Secondary | ICD-10-CM

## 2017-09-23 DIAGNOSIS — T43592A Poisoning by other antipsychotics and neuroleptics, intentional self-harm, initial encounter: Secondary | ICD-10-CM

## 2017-09-23 DIAGNOSIS — F1290 Cannabis use, unspecified, uncomplicated: Secondary | ICD-10-CM

## 2017-09-23 DIAGNOSIS — Z818 Family history of other mental and behavioral disorders: Secondary | ICD-10-CM

## 2017-09-23 NOTE — [facility] Group Notes (Signed)
Pt did not attend group. 

## 2017-09-23 NOTE — Progress Notes (Signed)
Received call from patient's husband, Joyelle Siedlecki [phone]). Written consent on chart for staff to speak with him. Husband angrily demanding discharge of wife stating, "Get her appointments ready so I can come get her! She called me and stated she does not feel safe. This whole thing is a misunderstanding. She was not trying to kill herself. I have spoken to her daughter and we have decided she is to be discharged." Explained to husband MD would need to make that determination and that her safety was of utmost concern. Reassured husband that unit and patient are safe. "I don't care. If she doesn't feel safe how is that helping her? I'm not explaining it to you. 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 I demand the MD call me immediately." Will pass on message to Dr. Parke Poisson and Marya Amsler, [place].
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
## 2017-09-23 NOTE — Progress Notes (Signed)
D: Patient observed isolative to room, keeps to self. States, "I don't like it here. I'm ready to go home."  Patient's affect flat, mood anxious. Per self inventory and discussions with writer, rates depression at a 5/10, hopelessness at a 5/10 and anxiety at a 5/10. Rates sleep as poor, appetite as fair, energy as low and concentration as poor.  States goal for today is to "not be depressed, go home to see my kitty babies." Denies pain, physical complaints.   A: Medicated per orders, no prns requested or required. Level III obs in place for safety. Emotional support offered and self inventory reviewed. Encouraged completion of Suicide Safety Plan and programming participation. Discussed POC with MD, SW.  Fall prevention plan in place and reviewed with patient as pt is a high fall risk due to frequent falls PTA.   R: Patient verbalizes understanding of POC, falls prevention education. States she does not wish to attend groups. "I don't feel comfortable sharing." Will continue to encourage attendance and reminded patient this is part of her treatment agreement.  Patient denies SI/HI/AVH and remains safe on level III obs. Will continue to monitor closely and make verbal contact frequently.
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
## 2017-09-23 NOTE — Plan of Care (Signed)
Patient verbalizes understanding of information, education provided. 

## 2017-09-23 NOTE — [facility] Suicide Risk Assessment (Signed)
[facility] Admission Suicide Risk Assessment   Nursing information obtained from:   patient and chart  Demographic factors:   67 year old female, married  Current Mental Status:   see below Loss Factors:   marital tension, argument  Historical Factors:   history of depression, history of cannabis abuse  Risk Reduction Factors:   resilience   Total Time spent with patient: 45 minutes Principal Problem:  MDD. S/P Suicide Attempt by overdose  Diagnosis:   Patient Active Problem List   Diagnosis Date Noted  . MDD (major depressive disorder), severe ([place]) [F32.2] 09/22/2017  . MDD (major depressive disorder), recurrent episode, moderate ([place]) [F33.1]   . Prolonged Q-T interval on ECG [R94.31] 09/20/2017  . Overdose [T50.901A] 09/20/2017  . Acute cystitis with hematuria [N30.01]   . Suicide attempt ([place]) [T14.91XA]   . Family history of diabetes mellitus- mom in 33's [Z83.3] 2017-08-30    Class: Chronic  . Family history of colon cancer- sister died 62yo [Z80.0] 2017/08/30  . Adjustment disorder with mixed anxiety < depressed mood [F43.23] 2017/08/30  . B12 deficiency [E53.8] Aug 30, 2017  . Fibromyalgia syndrome [M79.7] 11/23/2016  . Other insomnia [G47.09] 11/23/2016  . Trapezius muscle spasm [M62.838] 11/23/2016  . DDD (degenerative disc disease), cervical [M50.30] 11/23/2016  . DDD (degenerative disc disease), lumbar [M51.36] 11/23/2016  . Other idiopathic scoliosis, lumbar region [M41.26] 11/23/2016  . Malignant neoplasm of female breast ([place]) [C50.919] 11/23/2016  . History of gastroesophageal reflux (GERD) [Z87.19] 11/23/2016  . PCP NOTES >>>>>>>>>>>>>>>>>>>> [Z09] 11/21/2015  . Constipation [K59.00] 02/15/2015  . Annual physical exam [Z00.00] 11/20/2013  . COPD (chronic obstructive pulmonary disease) ([place]) [J44.9] 11/20/2013  . Unspecified adverse effect of unspecified drug, medicinal and biological substance [T88.7XXA] 12/10/2011  . Vitamin D deficiency [E55.9] 07/06/2011  .  CARDIOMYOPATHY, PRIMARY, DILATED [I42.8] 01/01/2009  . BREAST CANCER, HX OF [Z85.3] 11/02/2008  . h/o HYPERLIPIDEMIA [E78.2] 10/18/2007  . GERD [530.1] 10/18/2007  . ANEMIA, B12 DEFICIENCY [D51.8] 10/12/2007  . Anxiety,depression, insomnia [F41.9, F32.9] 10/12/2007  . IBS [K58.9] 10/12/2007  . Primary osteoarthritis of both hands [M19.041, M19.042] 10/12/2007  . Osteoporosis [M81.0] 10/12/2007  . COLONIC POLYPS, HX OF [Z86.010] 10/12/2007  . MIGRAINES, HX OF [Z87.898] 10/12/2007  . Fibromyalgia--Dr. Herold Harms, on Ultram [M79.7] 07/14/2007    Continued Clinical Symptoms:  Alcohol Use Disorder Identification Test Final Score (AUDIT): 0 The "Alcohol Use Disorders Identification Test", Guidelines for Use in Primary Care, Second Edition.  World Pharmacologist [facility]). Score between 0-7:  no or low risk or alcohol related problems. Score between 8-15:  moderate risk of alcohol related problems. Score between 16-19:  high risk of alcohol related problems. Score 20 or above:  warrants further diagnostic evaluation for alcohol dependence and treatment.   CLINICAL FACTORS:  67 year old married female, status post impulsive, unplanned overdose, which she states occurred in the context of marital argument . She required initial admission to medical unit.   Psychiatric Specialty Exam: Physical Exam  ROS  Blood pressure 119/75, pulse 93, temperature 98.6 F (37 C), temperature source Oral, resp. rate 16, height 5' (1.524 m), weight 42.6 kg (94 lb).Body mass index is 18.36 kg/m.  See admit note MSE   COGNITIVE FEATURES THAT CONTRIBUTE TO RISK:  Closed-mindedness and Loss of executive function    SUICIDE RISK:   Moderate:  Frequent suicidal ideation with limited intensity, and duration, some specificity in terms of plans, no associated intent, good self-control, limited dysphoria/symptomatology, some risk factors present, and identifiable protective factors,  including available and  accessible social support.  PLAN OF CARE: Patient will be admitted to inpatient psychiatric unit for stabilization and safety. Will provide and encourage milieu participation. Provide medication management and maked adjustments as needed.  Will follow daily.    I certify that inpatient services furnished can reasonably be expected to improve the patient's condition.   Jenne Campus, MD 09/23/2017, 2:22 PM

## 2017-09-23 NOTE — [facility] Group Notes (Signed)
[place] LCSW Group Therapy Note  Date/Time: 09/23/17, 1315  Type of Therapy/Topic:  Group Therapy:  Balance in Life  Participation Level:  Did not attend  Description of Group:    This group will address the concept of balance and how it feels and looks when one is unbalanced. Patients will be encouraged to process areas in their lives that are out of balance, and identify reasons for remaining unbalanced. Facilitators will guide patients utilizing problem- solving interventions to address and correct the stressor making their life unbalanced. Understanding and applying boundaries will be explored and addressed for obtaining  and maintaining a balanced life. Patients will be encouraged to explore ways to assertively make their unbalanced needs known to significant others in their lives, using other group members and facilitator for support and feedback.  Therapeutic Goals: 1. Patient will identify two or more emotions or situations they have that consume much of in their lives. 2. Patient will identify signs/triggers that life has become out of balance:  3. Patient will identify two ways to set boundaries in order to achieve balance in their lives:  4. Patient will demonstrate ability to communicate their needs through discussion and/or role plays  Summary of Patient Progress:          Therapeutic Modalities:   Cognitive Behavioral Therapy Solution-Focused Therapy Assertiveness Training  Lurline Idol, LCSW

## 2017-09-23 NOTE — [facility] Group Notes (Signed)
[facility] Group Notes:  (Nursing/MHT/Case Management/Adjunct)  Date:  09/23/2017  Time:  1615  Type of Therapy:  Nurse Education  - Suicide Safety Plan  Participation Level:  Did Not Attend  Participation Quality:    Affect:    Cognitive:    Insight:    Engagement in Group:    Modes of Intervention:    Summary of Progress/Problems: Patient continues to refuse participation in treatment. Continues to ask for discharge.  Jamie Kato 09/23/2017, 6:37 PM

## 2017-09-24 DIAGNOSIS — F39 Unspecified mood [affective] disorder: Secondary | ICD-10-CM

## 2017-09-24 DIAGNOSIS — G4700 Insomnia, unspecified: Secondary | ICD-10-CM

## 2017-09-24 DIAGNOSIS — Z87891 Personal history of nicotine dependence: Secondary | ICD-10-CM

## 2017-09-24 DIAGNOSIS — F1211 Cannabis abuse, in remission: Secondary | ICD-10-CM

## 2017-09-24 MED ORDER — TRAZODONE HCL 50 MG PO TABS
50.0000 mg | ORAL_TABLET | Freq: Every evening | ORAL | Status: DC | PRN
  Administered 2017-09-24: 50 mg via ORAL
  Filled 2017-09-24: qty 1

## 2017-09-24 NOTE — [facility] Group Notes (Signed)
LCSW Group Therapy Note 09/24/2017 3:59 PM  Type of Therapy and Topic: Group Therapy: Feelings around Relapse and Recovery  Participation Level: Did Not Attend   Description of Group:  Patients in this group will discuss emotions they experience before and after a relapse. They will process how experiencing these feelings, or avoidance of experiencing them, relates to having a relapse. Facilitator will guide patients to explore emotions they have related to recovery. Patients will be encouraged to process which emotions are more powerful. They will be guided to discuss the emotional reaction significant others in their lives may have to their relapse or recovery. Patients will be assisted in exploring ways to respond to the emotions of others without this contributing to a relapse.  Therapeutic Goals: 1. Patient will identify two or more emotions that lead to a relapse for them 2. Patient will identify two emotions that result when they relapse 3. Patient will identify two emotions related to recovery 4. Patient will demonstrate ability to communicate their needs through discussion and/or role plays  Summary of Patient Progress:  Patient was invited and chose not to attend.     Therapeutic Modalities:  Cognitive Behavioral Therapy Solution-Focused Therapy Assertiveness Training Relapse Prevention Therapy   Theresa Duty Clinical Social Worker

## 2017-09-24 NOTE — Progress Notes (Signed)
Recreation Therapy Notes  Date: 09/24/17 Time: 0930 Location: [address] Dayroom  Group Topic: Stress Management  Goal Area(s) Addresses:  Patient will verbalize importance of using healthy stress management.  Patient will identify positive emotions associated with healthy stress management.   Intervention: Stress Management  Activity : Progressive Muscle Relaxation.  LRT introduced the stress management technique of progressive muscle relaxation.  LRT led patients through the technique which allowed them to tense and relax each muscle group individually.  Education:  Stress Management, Discharge Planning.   Education Outcome: Acknowledges edcuation/In group clarification offered/Needs additional education  Clinical Observations/Feedback: Pt did not attend group.     Victorino Sparrow, LRT/CTRS         Victorino Sparrow A 09/24/2017 10:56 AM

## 2017-09-24 NOTE — Tx Team (Signed)
Interdisciplinary Treatment and Diagnostic Plan Update  09/24/2017 Time of Session: 10:15am [name] MRN: [id number]  Principal Diagnosis: MDD (major depressive disorder), severe ([place])  Secondary Diagnoses: Principal Problem:   MDD (major depressive disorder), severe ([place])   Current Medications:  Current Facility-Administered Medications  Medication Dose Route Frequency Provider Last Rate Last Dose  . albuterol (PROVENTIL HFA;VENTOLIN HFA) 108 (90 Base) MCG/ACT inhaler 2 puff  2 puff Inhalation Q4H PRN Rankin, Shuvon B, NP      . alum & mag hydroxide-simeth (MAALOX/MYLANTA) 200-200-20 MG/5ML suspension 30 mL  30 mL Oral Q4H PRN Rankin, Shuvon B, NP      . citalopram (CELEXA) tablet 40 mg  40 mg Oral Daily Rankin, Shuvon B, NP   40 mg at 09/24/17 0955  . loratadine (CLARITIN) tablet 10 mg  10 mg Oral Daily Rankin, Shuvon B, NP   10 mg at 09/24/17 0955  . magnesium hydroxide (MILK OF MAGNESIA) suspension 30 mL  30 mL Oral Daily PRN Rankin, Shuvon B, NP      . methocarbamol (ROBAXIN) tablet 750 mg  750 mg Oral BID PRN Rankin, Shuvon B, NP      . mometasone-formoterol (DULERA) 100-5 MCG/ACT inhaler 2 puff  2 puff Inhalation BID Rankin, Shuvon B, NP   2 puff at 09/24/17 0957  . traZODone (DESYREL) tablet 50 mg  50 mg Oral QHS PRN Money, Lowry Ram, FNP       PTA Medications: Medications Prior to Admission  Medication Sig Dispense Refill Last Dose  . albuterol (PROAIR HFA) 108 (90 Base) MCG/ACT inhaler Inhale 2 puffs into the lungs every 4 (four) hours as needed for wheezing or shortness of breath. 18 g 5 09/19/2017 at Unknown time  . budesonide-formoterol (SYMBICORT) 80-4.5 MCG/ACT inhaler Inhale 2 puffs into the lungs 2 (two) times daily. 1 Inhaler 6 09/19/2017 at Unknown time  . cetirizine (ZYRTEC) 10 MG tablet Take 10 mg by mouth daily.   09/19/2017 at Unknown time  . methocarbamol (ROBAXIN) 750 MG tablet TAKE 1 TABLET (750 MG TOTAL) BY MOUTH 2 (TWO) TIMES DAILY AS NEEDED FOR MUSCLE  SPASMS. 60 tablet 2 09/20/2017 at Unknown time  . Probiotic Product (PROBIOTIC PO) Take 1 tablet by mouth daily.   09/19/2017 at Unknown time  . traMADol (ULTRAM) 50 MG tablet TAKE 1 TO 2 TABLETS TWICE A DAY AS NEEDED (Patient not taking: Reported on 09/20/2017) 120 tablet 1 Completed Course at Unknown time    Patient Stressors: Health problems Marital or family conflict  Patient Strengths: Ability for insight Average or above average intelligence Capable of independent living [employer] of knowledge Motivation for treatment/growth Supportive family/friends  Treatment Modalities: Medication Management, Group therapy, Case management,  1 to 1 session with clinician, Psychoeducation, Recreational therapy.   Physician Treatment Plan for Primary Diagnosis: MDD (major depressive disorder), severe ([place]) Long Term Goal(s): Improvement in symptoms so as ready for discharge Improvement in symptoms so as ready for discharge   Short Term Goals: Ability to verbalize feelings will improve Ability to disclose and discuss suicidal ideas Ability to demonstrate self-control will improve Ability to identify and develop effective coping behaviors will improve Ability to maintain clinical measurements within normal limits will improve Compliance with prescribed medications will improve Ability to identify changes in lifestyle to reduce recurrence of condition will improve Ability to maintain clinical measurements within normal limits will improve  Medication Management: Evaluate patient's response, side effects, and tolerance of medication regimen.  Therapeutic Interventions: 1 to 1  sessions, Unit Group sessions and Medication administration.  Evaluation of Outcomes: Not Met  Physician Treatment Plan for Secondary Diagnosis: Principal Problem:   MDD (major depressive disorder), severe ([place])  Long Term Goal(s): Improvement in symptoms so as ready for discharge Improvement in symptoms so as ready  for discharge   Short Term Goals: Ability to verbalize feelings will improve Ability to disclose and discuss suicidal ideas Ability to demonstrate self-control will improve Ability to identify and develop effective coping behaviors will improve Ability to maintain clinical measurements within normal limits will improve Compliance with prescribed medications will improve Ability to identify changes in lifestyle to reduce recurrence of condition will improve Ability to maintain clinical measurements within normal limits will improve     Medication Management: Evaluate patient's response, side effects, and tolerance of medication regimen.  Therapeutic Interventions: 1 to 1 sessions, Unit Group sessions and Medication administration.  Evaluation of Outcomes: Not Met   RN Treatment Plan for Primary Diagnosis: MDD (major depressive disorder), severe ([place]) Long Term Goal(s): Knowledge of disease and therapeutic regimen to maintain health will improve  Short Term Goals: Ability to verbalize frustration and anger appropriately will improve, Ability to demonstrate self-control, Ability to participate in decision making will improve, Ability to verbalize feelings will improve and Ability to identify and develop effective coping behaviors will improve  Medication Management: RN will administer medications as ordered by provider, will assess and evaluate patient's response and provide education to patient for prescribed medication. RN will report any adverse and/or side effects to prescribing provider.  Therapeutic Interventions: 1 on 1 counseling sessions, Psychoeducation, Medication administration, Evaluate responses to treatment, Monitor vital signs and CBGs as ordered, Perform/monitor CIWA, COWS, AIMS and Fall Risk screenings as ordered, Perform wound care treatments as ordered.  Evaluation of Outcomes: Not Met   LCSW Treatment Plan for Primary Diagnosis: MDD (major depressive disorder), severe  ([place]) Long Term Goal(s): Safe transition to appropriate next level of care at discharge, Engage patient in therapeutic group addressing interpersonal concerns.  Short Term Goals: Engage patient in aftercare planning with referrals and resources, Increase ability to appropriately verbalize feelings, Increase emotional regulation, Facilitate acceptance of mental health diagnosis and concerns and Increase skills for wellness and recovery  Therapeutic Interventions: Assess for all discharge needs, 1 to 1 time with Social worker, Explore available resources and support systems, Assess for adequacy in community support network, Educate family and significant other(s) on suicide prevention, Complete Psychosocial Assessment, Interpersonal group therapy.  Evaluation of Outcomes: Not Met   Progress in Treatment: Attending groups: Yes. Participating in groups: Yes. Taking medication as prescribed: Yes. Toleration medication: Yes. Family/Significant other contact made: Yes, individual(s) contacted:  daughter, Gevena Mart  Patient understands diagnosis: Yes. Discussing patient identified problems/goals with staff: Yes. Medical problems stabilized or resolved: Yes. Denies suicidal/homicidal ideation: Yes. Issues/concerns per patient self-inventory: No. Other:   New problem(s) identified: No, Describe:  none at this time.   New Short Term/Long Term Goal(s):  medication stabilization, elimination of SI thoughts, development of comprehensive mental wellness plan.    Patient Goal: "I want to work on my depression and anger issues"   Discharge Plan or Barriers: Return home and follow up with PCP for med management and outpatient provider for therapy services.   Reason for Continuation of Hospitalization: Aggression Anxiety Depression  Estimated Length of Stay:  Attendees: Patient: [name] 09/24/2017 11:42 AM  Physician: Dr. Neita Garnet 09/24/2017 11:42 AM  Nursing: Chong Sicilian, RN   09/24/2017 11:42 AM  RN  Care Manager: 09/24/2017 11:42 AM  Social Worker: Radonna Ricker, [place] 09/24/2017 11:42 AM  Recreational Therapist:  09/24/2017 11:42 AM  Other:  09/24/2017 11:42 AM  Other:  09/24/2017 11:42 AM  Other: 09/24/2017 11:42 AM    Scribe for Treatment Team: Marylee Floras, [place] 09/24/2017 11:42 AM

## 2017-09-24 NOTE — Progress Notes (Signed)
[facility] MD Progress Note  09/24/2017 11:13 AM [name]  MRN:  [id number]   Subjective:  Patient reports that she did not sleep good last night, but that is normal for her. She doesn't sleep good every day at home. She agrees to try some Trazodone for sleep tonight. She denies any SI/HI/AVH and contracts for safety. She denies any medication side effects. She is hoping to discharge soon.  Objective: Patient's chart and findings reviewed and discussed with treatment team. Patient presents in her room and is pleasant and cooperative. She has been interacting appropriately with others. She has a bright affect and communicates well. Will order Trazodone 50 mg QHS PRN for sleep. Requested patient to attend group sand she reports that she is supposed to wear hearing aides but she lost one at home and did not bring the other one with her and she cannot hear in a group setting unless the speaker sits directly in front of her. She agrees to speak with the peer support specialist for therapy.  Principal Problem: MDD (major depressive disorder), severe ([place]) Diagnosis:   Patient Active Problem List   Diagnosis Date Noted  . MDD (major depressive disorder), severe ([place]) [F32.2] 09/22/2017  . MDD (major depressive disorder), recurrent episode, moderate ([place]) [F33.1]   . Prolonged Q-T interval on ECG [R94.31] 09/20/2017  . Overdose [T50.901A] 09/20/2017  . Acute cystitis with hematuria [N30.01]   . Suicide attempt ([place]) [T14.91XA]   . Family history of diabetes mellitus- mom in 50's [Z83.3] 2017-08-25    Class: Chronic  . Family history of colon cancer- sister died 39yo [Z80.0] 08-25-2017  . Adjustment disorder with mixed anxiety < depressed mood [F43.23] 2017/08/25  . B12 deficiency [E53.8] 2017-08-25  . Fibromyalgia syndrome [M79.7] 11/23/2016  . Other insomnia [G47.09] 11/23/2016  . Trapezius muscle spasm [M62.838] 11/23/2016  . DDD (degenerative disc disease), cervical [M50.30] 11/23/2016  . DDD  (degenerative disc disease), lumbar [M51.36] 11/23/2016  . Other idiopathic scoliosis, lumbar region [M41.26] 11/23/2016  . Malignant neoplasm of female breast ([place]) [C50.919] 11/23/2016  . History of gastroesophageal reflux (GERD) [Z87.19] 11/23/2016  . PCP NOTES >>>>>>>>>>>>>>>>>>>> [Z09] 11/21/2015  . Constipation [K59.00] 02/15/2015  . Annual physical exam [Z00.00] 11/20/2013  . COPD (chronic obstructive pulmonary disease) ([place]) [J44.9] 11/20/2013  . Unspecified adverse effect of unspecified drug, medicinal and biological substance [T88.7XXA] 12/10/2011  . Vitamin D deficiency [E55.9] 07/06/2011  . CARDIOMYOPATHY, PRIMARY, DILATED [I42.8] 01/01/2009  . BREAST CANCER, HX OF [Z85.3] 11/02/2008  . h/o HYPERLIPIDEMIA [E78.2] 10/18/2007  . GERD [530.1] 10/18/2007  . ANEMIA, B12 DEFICIENCY [D51.8] 10/12/2007  . Anxiety,depression, insomnia [F41.9, F32.9] 10/12/2007  . IBS [K58.9] 10/12/2007  . Primary osteoarthritis of both hands [M19.041, M19.042] 10/12/2007  . Osteoporosis [M81.0] 10/12/2007  . COLONIC POLYPS, HX OF [Z86.010] 10/12/2007  . MIGRAINES, HX OF [Z87.898] 10/12/2007  . Fibromyalgia--Dr. Herold Harms, on Ultram [M79.7] 07/14/2007   Total Time spent with patient: 25 minutes  Past Psychiatric History: See H&P  Past Medical History:  Past Medical History:  Diagnosis Date  . Anemia    B12 deficient  . B12 deficiency   . Breast cancer ([place])    left breast.  . Chronic SI joint pain    Bilateral, Dr. Estanislado Pandy  . Fibromyalgia    Dr. Estanislado Pandy  . GERD (gastroesophageal reflux disease)   . Greater trochanteric bursitis of both hips    Dr. Estanislado Pandy  . Hyperlipidemia   . IBS (irritable bowel syndrome)   . LV dysfunction  iatrogenic from chemotherapy ; on Carvedilol   . Migraines   . Osteoporosis    Dr Lomax---> transferring to a new gyn    Past Surgical History:  Procedure Laterality Date  . ABDOMINAL HYSTERECTOMY     BSO for  Endometriosis  . CARPAL TUNNEL  RELEASE     left  . CERVICAL DISC SURGERY    . COLONOSCOPY W/ POLYPECTOMY  1999   Dr Earlean Shawl  . ESOPHAGEAL DILATION  2005   Dr Earlean Shawl  . MASTECTOMY  2009   bilateral, Dr Margot Chimes  . SEPTOPLASTY     Family History:  Family History  Problem Relation Age of Onset  . Diabetes Mother   . Breast cancer Mother   . Depression Mother        anxiety  . Heart disease Mother        in her 63s  . Colon cancer Sister   . Healthy Daughter   . Stroke Neg Hx    Family Psychiatric  History: See H&P Social History:  Social History   Substance and Sexual Activity  Alcohol Use No     Social History   Substance and Sexual Activity  Drug Use Yes  . Types: Marijuana   Comment: 08/01/2017 last used     Social History   Socioeconomic History  . Marital status: Married    Spouse name: None  . Number of children: 1  . Years of education: None  . Highest education level: None  Social Needs  . Financial resource strain: None  . Food insecurity - worry: None  . Food insecurity - inability: None  . Transportation needs - medical: None  . Transportation needs - non-medical: None  Occupational History  . Occupation: retired, Medical sales representative   Tobacco Use  . Smoking status: Former Smoker    Last attempt to quit: 08/31/1990    Years since quitting: 27.0  . Smokeless tobacco: Never Used  . Tobacco comment: smoked 1971-1992, up to 1 ppd  Substance and Sexual Activity  . Alcohol use: No  . Drug use: Yes    Types: Marijuana    Comment: 08/01/2017 last used   . Sexual activity: Not Currently  Other Topics Concern  . None  Social History Narrative   Lives w/ husband   Additional Social History:                         Sleep: Fair  Appetite:  Good  Current Medications: Current Facility-Administered Medications  Medication Dose Route Frequency Provider Last Rate Last Dose  . albuterol (PROVENTIL HFA;VENTOLIN HFA) 108 (90 Base) MCG/ACT inhaler 2 puff  2 puff Inhalation Q4H PRN Rankin,  Shuvon B, NP      . alum & mag hydroxide-simeth (MAALOX/MYLANTA) 200-200-20 MG/5ML suspension 30 mL  30 mL Oral Q4H PRN Rankin, Shuvon B, NP      . citalopram (CELEXA) tablet 40 mg  40 mg Oral Daily Rankin, Shuvon B, NP   40 mg at 09/24/17 0955  . loratadine (CLARITIN) tablet 10 mg  10 mg Oral Daily Rankin, Shuvon B, NP   10 mg at 09/24/17 0955  . magnesium hydroxide (MILK OF MAGNESIA) suspension 30 mL  30 mL Oral Daily PRN Rankin, Shuvon B, NP      . methocarbamol (ROBAXIN) tablet 750 mg  750 mg Oral BID PRN Rankin, Shuvon B, NP      . mometasone-formoterol (DULERA) 100-5 MCG/ACT inhaler 2 puff  2 puff  Inhalation BID Rankin, Shuvon B, NP   2 puff at 09/24/17 0957    Lab Results: No results found for this or any previous visit (from the past 48 hour(s)).  Blood Alcohol level:  Lab Results  Component Value Date   ETH <10 42/59/5638    Metabolic Disorder Labs: Lab Results  Component Value Date   HGBA1C 5.5 10/18/2007   No results found for: PROLACTIN Lab Results  Component Value Date   CHOL 220 (H) 04/01/2017   TRIG 83.0 04/01/2017   HDL 97.90 04/01/2017   CHOLHDL 2 04/01/2017   VLDL 16.6 04/01/2017   LDLCALC 105 (H) 04/01/2017   LDLCALC 110 (H) 11/21/2015    Physical Findings: AIMS: Facial and Oral Movements Muscles of Facial Expression: None, normal Lips and Perioral Area: None, normal Jaw: None, normal Tongue: None, normal,Extremity Movements Upper (arms, wrists, hands, fingers): None, normal Lower (legs, knees, ankles, toes): None, normal, Trunk Movements Neck, shoulders, hips: None, normal, Overall Severity Severity of abnormal movements (highest score from questions above): None, normal Incapacitation due to abnormal movements: None, normal Patient's awareness of abnormal movements (rate only patient's report): No Awareness, Dental Status Current problems with teeth and/or dentures?: No Does patient usually wear dentures?: Yes(bottom dentures)  CIWA:    COWS:      Musculoskeletal: Strength & Muscle Tone: within normal limits Gait & Station: normal Patient leans: N/A  Psychiatric Specialty Exam: Physical Exam  Nursing note and vitals reviewed. Constitutional: She is oriented to person, place, and time. She appears well-developed and well-nourished.  Respiratory: Effort normal.  Musculoskeletal: Normal range of motion.  Neurological: She is oriented to person, place, and time.  Skin: Skin is warm.    Review of Systems  Constitutional: Negative.   HENT: Negative.   Eyes: Negative.   Respiratory: Negative.   Cardiovascular: Negative.   Gastrointestinal: Negative.   Genitourinary: Negative.   Musculoskeletal: Negative.   Skin: Negative.   Neurological: Negative.   Endo/Heme/Allergies: Negative.   Psychiatric/Behavioral: Positive for depression (Reports chronic depression). The patient has insomnia.     Blood pressure 95/72, pulse (!) 102, temperature 98 F (36.7 C), temperature source Oral, resp. rate 16, height 5' (1.524 m), weight 42.6 kg (94 lb).Body mass index is 18.36 kg/m.  General Appearance: Casual  Eye Contact:  Good  Speech:  Clear and Coherent and Normal Rate  Volume:  Normal  Mood:  Depressed  Affect:  Congruent  Thought Process:  Goal Directed and Descriptions of Associations: Intact  Orientation:  Full (Time, Place, and Person)  Thought Content:  WDL  Suicidal Thoughts:  No  Homicidal Thoughts:  No  Memory:  Immediate;   Good Recent;   Good Remote;   Good  Judgement:  Good  Insight:  Good  Psychomotor Activity:  Normal  Concentration:  Concentration: Good and Attention Span: Good  Recall:  Good  Fund of Knowledge:  Good  Language:  Good  Akathisia:  No  Handed:  Right  AIMS (if indicated):     Assets:  Communication Skills Desire for Improvement Financial Resources/Insurance Housing Social Support Transportation  ADL's:  Intact  Cognition:  WNL  Sleep:  Number of Hours: 6.25   Problems  Addressed: MDD severe  Treatment Plan Summary: Daily contact with patient to assess and evaluate symptoms and progress in treatment, Medication management and Plan is to:  -Continue Celexa 40 mg PO Daily for mood stability -Start Trazodone 50 mg PO QHS PRN for insomnia -Encourage therapy with peer support  of other staff  Lewis Shock, FNP 09/24/2017, 11:13 AM   Agree with NP Progress Note

## 2017-09-24 NOTE — Progress Notes (Signed)
The patient attended the evening Wrap-Up group and was appropriate.  

## 2017-09-24 NOTE — Progress Notes (Signed)
Pt has been in her room all evening.  She has been lying in bed reading.  She reports that she feels fine and denies SI/HI/AVH at this time.  She was pleasant and cooperative with Probation officer.  She did not attend evening group.  She came to the med window to get her [facility] inhaler, but stated she did not need anything for sleep.  She is focused on discharge and says that she will probably discharge tomorrow.  Support and encouragement offered.  Discharge plans are in process.  Safety maintained with q15 minute checks.

## 2017-09-24 NOTE — Progress Notes (Signed)
D Patient is adapting to routine on unit. A She completed her daily assessment and on it she wrote she denied SI today and she rated her depression, hopelessness and anxeity " 5/5/5", R Safety in palce

## 2017-09-25 MED ORDER — CITALOPRAM HYDROBROMIDE 40 MG PO TABS: 40.0000 mg | ORAL_TABLET | Freq: Every day | ORAL | 0 refills | Status: DC

## 2017-09-25 MED ORDER — TRAZODONE HCL 50 MG PO TABS: 50.0000 mg | ORAL_TABLET | Freq: Every evening | ORAL | 0 refills | Status: DC | PRN

## 2017-09-25 NOTE — Discharge Summary (Addendum)
Physician Discharge Summary Note  Patient:  [name] is an 67 y.o., female MRN:  [id number] DOB:  [birth year] Patient phone:  [phone] (home)  Patient address:   [address] [place] [address],  Total Time spent with patient: 20 minutes  Date of Admission:  09/22/2017 Date of Discharge: 09/25/17   Reason for Admission:  SI with impulsive overdose  Principal Problem: MDD (major depressive disorder), severe [facility]) Discharge Diagnoses: Patient Active Problem List   Diagnosis Date Noted  . MDD (major depressive disorder), severe ([place]) [F32.2] 09/22/2017  . MDD (major depressive disorder), recurrent episode, moderate ([place]) [F33.1]   . Prolonged Q-T interval on ECG [R94.31] 09/20/2017  . Overdose [T50.901A] 09/20/2017  . Acute cystitis with hematuria [N30.01]   . Suicide attempt ([place]) [T14.91XA]   . Family history of diabetes mellitus- mom in 15's [Z83.3] Aug 28, 2017    Class: Chronic  . Family history of colon cancer- sister died 85yo [Z80.0] 08-28-2017  . Adjustment disorder with mixed anxiety < depressed mood [F43.23] 2017/08/28  . B12 deficiency [E53.8] 2017/08/28  . Fibromyalgia syndrome [M79.7] 11/23/2016  . Other insomnia [G47.09] 11/23/2016  . Trapezius muscle spasm [M62.838] 11/23/2016  . DDD (degenerative disc disease), cervical [M50.30] 11/23/2016  . DDD (degenerative disc disease), lumbar [M51.36] 11/23/2016  . Other idiopathic scoliosis, lumbar region [M41.26] 11/23/2016  . Malignant neoplasm of female breast ([place]) [C50.919] 11/23/2016  . History of gastroesophageal reflux (GERD) [Z87.19] 11/23/2016  . PCP NOTES >>>>>>>>>>>>>>>>>>>> [Z09] 11/21/2015  . Constipation [K59.00] 02/15/2015  . Annual physical exam [Z00.00] 11/20/2013  . COPD (chronic obstructive pulmonary disease) ([place]) [J44.9] 11/20/2013  . Unspecified adverse effect of unspecified drug, medicinal and biological substance [T88.7XXA] 12/10/2011  . Vitamin D deficiency [E55.9] 07/06/2011   . CARDIOMYOPATHY, PRIMARY, DILATED [I42.8] 01/01/2009  . BREAST CANCER, HX OF [Z85.3] 11/02/2008  . h/o HYPERLIPIDEMIA [E78.2] 10/18/2007  . GERD [530.1] 10/18/2007  . ANEMIA, B12 DEFICIENCY [D51.8] 10/12/2007  . Anxiety,depression, insomnia [F41.9, F32.9] 10/12/2007  . IBS [K58.9] 10/12/2007  . Primary osteoarthritis of both hands [M19.041, M19.042] 10/12/2007  . Osteoporosis [M81.0] 10/12/2007  . COLONIC POLYPS, HX OF [Z86.010] 10/12/2007  . MIGRAINES, HX OF [Z87.898] 10/12/2007  . Fibromyalgia--Dr. Herold Harms, on Ultram [M79.7] 07/14/2007    Past Psychiatric History: reports history of depression in the past, which she describes as intermittent and generally mild or moderate at times . Denies history of psychosis, denies history of mania, no prior history of suicide attempts, no history of prior overdoses , no history of self injurious ideations, no history of self cutting .Denies history of violence .  No prior psychiatric admissions  Past Medical History:  Past Medical History:  Diagnosis Date  . Anemia    B12 deficient  . B12 deficiency   . Breast cancer ([place])    left breast.  . Chronic SI joint pain    Bilateral, Dr. Estanislado Pandy  . Fibromyalgia    Dr. Estanislado Pandy  . GERD (gastroesophageal reflux disease)   . Greater trochanteric bursitis of both hips    Dr. Estanislado Pandy  . Hyperlipidemia   . IBS (irritable bowel syndrome)   . LV dysfunction    iatrogenic from chemotherapy ; on Carvedilol   . Migraines   . Osteoporosis    Dr Lomax---> transferring to a new gyn    Past Surgical History:  Procedure Laterality Date  . ABDOMINAL HYSTERECTOMY     BSO for  Endometriosis  . CARPAL TUNNEL RELEASE     left  . CERVICAL  [place] [facility]    . COLONOSCOPY W/ POLYPECTOMY  1999   Dr Earlean Shawl  . ESOPHAGEAL DILATION  2005   Dr Earlean Shawl  . MASTECTOMY  2009   bilateral, Dr Margot Chimes  . SEPTOPLASTY     Family History:  Family History  Problem Relation Age of Onset  . Diabetes Mother   .  Breast cancer Mother   . Depression Mother        anxiety  . Heart disease Mother        in her 25s  . Colon cancer Sister   . Healthy Daughter   . Stroke Neg Hx    Family Psychiatric  History: Mother had history of depression,  Brother was alcoholic , no known suicides in family   Social History:  Social History   Substance and Sexual Activity  Alcohol Use No     Social History   Substance and Sexual Activity  Drug Use Yes  . Types: Marijuana   Comment: 08/01/2017 last used     Social History   Socioeconomic History  . Marital status: Married    Spouse name: None  . Number of children: 1  . Years of education: None  . Highest education level: None  Social Needs  . Financial resource strain: None  . Food insecurity - worry: None  . Food insecurity - inability: None  . Transportation needs - medical: None  . Transportation needs - non-medical: None  Occupational History  . Occupation: retired, Medical sales representative   Tobacco Use  . Smoking status: Former Smoker    Last attempt to quit: 08/31/1990    Years since quitting: 27.0  . Smokeless tobacco: Never Used  . Tobacco comment: smoked 1971-1992, up to 1 ppd  Substance and Sexual Activity  . Alcohol use: No  . Drug use: Yes    Types: Marijuana    Comment: 08/01/2017 last used   . Sexual activity: Not Currently  Other Topics Concern  . None  Social History Narrative   Lives w/ husband    Hospital Course:   09/21/17 Psychiatric Consult: Per chart review, patient reports depression for the past week and attempted to commit suicide by overdosing on Atarax (#28). UDS was positive for marijuana. BAL was negative. QTc was prolonged to 589 and now 483. She is prescribed Celexa 40 mg daily.  On interview, [name] reports that she had an argument with her husband and reports that they argue often. She impulsively overdosed on Atarax following the argument. Her husband found her in the kitchen crying and she informed him of the  overdose so he called 911. She regrets her overdose. She reports a chronic history of depression. Her mood is "up and down" depending on her current stressors. She reports that a significant stressor is marital discord. She has completed marital counseling in the past but she was terminated by her therapist due to inappropriate behavior. She reports accusing her therapist of lying to her. She additionally reports problems with initiating sleep and multiple nighttime awakenings. She reports fluctuations in appetite which she attributes to chronic neck pain, irritability, hopelessness and helplessness. She denies problems with concentration or anhedonia. She denies a history of decreased need for sleep, euphoria or increased energy. She denies current SI, HI or AVH. She reports compliance with Celexa. She denies recent medication changes. Patient's husband and daughter were spoken to separately with the patient's permission. They report that she is in a dark place. She is a "hermit and hoarder." She  has not followed up with her doctor's appointment. She had a fall last week with loss of consciousness. She is not forthcoming with information.   09/23/17 [facility] MD Assessment: 67 year old married female, who presented to ED via EMS after suicidal attempt by overdosing on 700 mgrs of Hydroxyzine . Her QTc was initially prolonged , and required inpatient medical admission. Transferred to [facility] upon medical clearance. She states " my husband and I had an argument about some important financial problem". States she normally does not engage in arguments but this one escalated because she felt ignored by husband and " because my neck had been hurting". She states overdose was impulsive, unplanned . States she took 29 tablets of Hydroxyzine . States " I don't really knwo what I was thinking , I just took them".After the overdose she told her husband about having done it, and 911 was contacted. This occurred 3 days ago. Patient  states she has been dealing with depression, which she attributes in part to marital difficulties .  Patient remained on the [facility] unit for 2 days and stabilized with medication and therapy. Patient was continued on her Celexa 40 mg Daily and used Trazodone 50 mg QHS PRN. She continually denies any SI/HI/AVH throughout the stay. Patient showed improvement with improved mood, affect, sleep, appetite, and interaction. Patient has been attending group sand participating. Patient has been seen in the day room interacting with peers and staff appropriately. She denies any current SI/HI/AVH and contracts for safety. She agrees to follow up at her PCP and [facility]. Patient is provided with prescriptions of her medications upon discharge.        Physical Findings: AIMS: Facial and Oral Movements Muscles of Facial Expression: None, normal Lips and Perioral Area: None, normal Jaw: None, normal Tongue: None, normal,Extremity Movements Upper (arms, wrists, hands, fingers): None, normal Lower (legs, knees, ankles, toes): None, normal, Trunk Movements Neck, shoulders, hips: None, normal, Overall Severity Severity of abnormal movements (highest score from questions above): None, normal Incapacitation due to abnormal movements: None, normal Patient's awareness of abnormal movements (rate only patient's report): No Awareness, Dental Status Current problems with teeth and/or dentures?: No Does patient usually wear dentures?: Yes(bottom dentures)  CIWA:    COWS:     Musculoskeletal: Strength & Muscle Tone: within normal limits Gait & Station: normal Patient leans: N/A  Psychiatric Specialty Exam: Physical Exam  Nursing note and vitals reviewed. Constitutional: She is oriented to person, place, and time. She appears well-developed and well-nourished.  Cardiovascular: Normal rate.  Respiratory: Effort normal.  Musculoskeletal: Normal range of motion.  Neurological: She is alert and oriented to person,  place, and time.  Skin: Skin is warm.    Review of Systems  Constitutional: Negative.   HENT: Negative.   Eyes: Negative.   Respiratory: Negative.   Cardiovascular: Negative.   Gastrointestinal: Negative.   Genitourinary: Negative.   Musculoskeletal: Negative.   Skin: Negative.   Neurological: Negative.   Endo/Heme/Allergies: Negative.   Psychiatric/Behavioral: Negative.     Blood pressure 111/75, pulse 98, temperature 98 F (36.7 C), temperature source Oral, resp. rate 20, height 5' (1.524 m), weight 42.6 kg (94 lb).Body mass index is 18.36 kg/m.  General Appearance: Casual  Eye Contact:  Good  Speech:  Clear and Coherent and Normal Rate  Volume:  Normal  Mood:  Euthymic  Affect:  Congruent  Thought Process:  Goal Directed and Descriptions of Associations: Intact  Orientation:  Full (Time, Place, and Person)  Thought  Content:  WDL  Suicidal Thoughts:  No  Homicidal Thoughts:  No  Memory:  Immediate;   Good Recent;   Good Remote;   Good  Judgement:  Good  Insight:  Good  Psychomotor Activity:  Normal  Concentration:  Concentration: Good and Attention Span: Good  Recall:  Good  Fund of Knowledge:  Good  Language:  Good  Akathisia:  No  Handed:  Right  AIMS (if indicated):     Assets:  Communication Skills Desire for Improvement Financial Resources/Insurance Housing Physical Health Social Support Transportation  ADL's:  Intact  Cognition:  WNL  Sleep:  Number of Hours: 6.25     Have you used any form of tobacco in the last 30 days? (Cigarettes, Smokeless Tobacco, Cigars, and/or Pipes): No  Has this patient used any form of tobacco in the last 30 days? (Cigarettes, Smokeless Tobacco, Cigars, and/or Pipes) Yes, No  Blood Alcohol level:  Lab Results  Component Value Date   ETH <10 25/95/6387    Metabolic Disorder Labs:  Lab Results  Component Value Date   HGBA1C 5.5 10/18/2007   No results found for: PROLACTIN Lab Results  Component Value Date    CHOL 220 (H) 04/01/2017   TRIG 83.0 04/01/2017   HDL 97.90 04/01/2017   CHOLHDL 2 04/01/2017   VLDL 16.6 04/01/2017   LDLCALC 105 (H) 04/01/2017   LDLCALC 110 (H) 11/21/2015    See Psychiatric Specialty Exam and Suicide Risk Assessment completed by Attending Physician prior to discharge.  Discharge destination:  Home  Is patient on multiple antipsychotic therapies at discharge:  No   Has Patient had three or more failed trials of antipsychotic monotherapy by history:  No  Recommended Plan for Multiple Antipsychotic Therapies: NA   Allergies as of 09/25/2017      Reactions   Amoxicillin-pot Clavulanate    diarrhea   Doxycycline    Nausea & vomiting   Venlafaxine    ? Reaction; ? Blurred vision      Medication List    STOP taking these medications   traMADol 50 MG tablet Commonly known as:  ULTRAM     TAKE these medications     Indication  albuterol 108 (90 Base) MCG/ACT inhaler Commonly known as:  PROAIR HFA Inhale 2 puffs into the lungs every 4 (four) hours as needed for wheezing or shortness of breath.  Indication:  Asthma   budesonide-formoterol 80-4.5 MCG/ACT inhaler Commonly known as:  SYMBICORT Inhale 2 puffs into the lungs 2 (two) times daily.  Indication:  Asthma, Chronic Obstructive Lung Disease   cetirizine 10 MG tablet Commonly known as:  ZYRTEC Take 10 mg by mouth daily.  Indication:  Hayfever   citalopram 40 MG tablet Commonly known as:  CELEXA Take 1 tablet (40 mg total) by mouth daily. For mood control Start taking on:  09/26/2017  Indication:  mood stability   methocarbamol 750 MG tablet Commonly known as:  ROBAXIN TAKE 1 TABLET (750 MG TOTAL) BY MOUTH 2 (TWO) TIMES DAILY AS NEEDED FOR MUSCLE SPASMS.  Indication:  Musculoskeletal Pain   PROBIOTIC PO Take 1 tablet by mouth daily.  Indication:  Per PCP   traZODone 50 MG tablet Commonly known as:  DESYREL Take 1 tablet (50 mg total) by mouth at bedtime as needed for sleep.  Indication:   Trouble Sleeping      Follow-up Information     [facility] Follow up.   Why:  Appointment is Wednesday,  09/30/17 at 9:00am with you PCP, Dr. Raliegh Scarlet. Please be sure to bring any discharge information and medication lists.  Contact information: [place] [address] [phone]       [facility] Follow up on 10/01/2017.   Specialty:  Behavioral Health Why:  Hospital follow-up on Friday 2/1 at 9:15AM. Please bring photo ID and insurance card. Thank you.  Contact information: [address] [place] [address] [phone]           Follow-up recommendations:  Continue activity as tolerated. Continue diet as recommended by your PCP. Ensure to keep all appointments with outpatient providers.  Comments:  Patient is instructed prior to discharge to: Take all medications as prescribed by his/her mental healthcare provider. Report any adverse effects and or reactions from the medicines to his/her outpatient provider promptly. Patient has been instructed & cautioned: To not engage in alcohol and or illegal drug use while on prescription medicines. In the event of worsening symptoms, patient is instructed to call the crisis hotline, 911 and or go to the nearest ED for appropriate evaluation and treatment of symptoms. To follow-up with his/her primary care provider for your other medical issues, concerns and or health care needs.    Signed: [place], FNP 09/25/2017, 2:00 PM   Patient seen, Suicide Assessment Completed.Disposition Plan Reviewed

## 2017-09-25 NOTE — Progress Notes (Signed)
  [facility] Adult Case Management Discharge Plan :  Will you be returning to the same living situation after discharge:  Yes,  with spouse At discharge, do you have transportation home?: Yes,  arranged by pt Do you have the ability to pay for your medications: Yes,  no barriers identified  Release of information consent forms completed and turned in to Medical Records by CSW.  Patient to Follow up at: Follow-up Information    [facility] Follow up.   Why:  Appointment is Wednesday, 09/30/17 at 9:00am with you PCP, Dr. Raliegh Scarlet. Please be sure to bring any discharge information and medication lists.  Contact information: [place] [address] [phone]       [facility] Follow up on 10/01/2017.   Specialty:  Behavioral Health Why:  Hospital follow-up on Friday 2/1 at 9:15AM. Please bring photo ID and insurance card. Thank you.  Contact information: [place] [address] [phone]           Next level of care provider has access to [place] and Suicide Prevention discussed: Yes,  with daughter  Have you used any form of tobacco in the last 30 days? (Cigarettes, Smokeless Tobacco, Cigars, and/or Pipes): No  Has patient been referred to the Quitline?: N/A patient is not a smoker  Patient has been referred for addiction treatment: Yes  Maretta Los, LCSW 09/25/2017, 1:37 PM

## 2017-09-25 NOTE — [facility] Group Notes (Signed)
[facility] LCSW Group Therapy Note  Date/Time:    09/25/2017 10:00-11:00AM  Type of Therapy and Topic:  Group Therapy:  Healthy vs Unhealthy Coping Skills  Participation Level:  Active   Description of Group:  The focus of this group was to determine what unhealthy coping techniques typically are used by group members and what healthy coping techniques would be helpful in coping with various problems. Patients were guided in becoming aware of the differences between healthy and unhealthy coping techniques.  Patients were asked to identify 1-2 healthy coping skills they would like to learn to use more effectively, and many mentioned meditation, breathing, and relaxation.  These were explained, samples demonstrated, and resources shared for how to learn more at discharge.   At the end of group, additional ideas of healthy coping skills were shared in a fun exercise.  Therapeutic Goals 1. Patients learned that coping is what human beings do all day long to deal with various situations in their lives 2. Patients defined and discussed healthy vs unhealthy coping techniques 3. Patients identified their preferred coping techniques and identified whether these were healthy or unhealthy 4. Patients determined 1-2 healthy coping skills they would like to become more familiar with and use more often, and practiced a few meditations 5. Patients provided support and ideas to each other  Summary of Patient Progress: During group, patient expressed herself frequently once she arrived at the 30-minute mark.  She talked about adding some healthy coping skills like spending time each day meditating.   Therapeutic Modalities Cognitive Behavioral Therapy Motivational Interviewing   Selmer Dominion, LCSW 09/25/2017, 1:04 PM

## 2017-09-25 NOTE — Progress Notes (Signed)
D Patient completes daily assessment and on this she wrote she deneid SI today . Her dc instructions are given to her and she states verbal understanding. She is given cc of dc instructions ( AVS, SRA, SSP and  Transition record. ). Pt is given all belongings in her locker and then escorted to bldg entrance and dc'd.

## 2017-09-25 NOTE — Progress Notes (Signed)
Pt reports she is doing better this afternoon and had a good day.  She denies SI/HI/AVH.  She hopes to be discharged home tomorrow.  She has been out of her room and attended evening wrap up group.  She has been observed talking to some of her peers this evening.  Support and encouragement offered.  Discharge plans are in process.  Safety maintained with q15 minute checks.

## 2017-09-25 NOTE — BHH Suicide Risk Assessment (Signed)
[facility] Discharge Suicide Risk Assessment   Principal Problem: MDD (major depressive disorder), severe [facility]) Discharge Diagnoses:  Patient Active Problem List   Diagnosis Date Noted  . MDD (major depressive disorder), severe ([place]) [F32.2] 09/22/2017  . MDD (major depressive disorder), recurrent episode, moderate ([place]) [F33.1]   . Prolonged Q-T interval on ECG [R94.31] 09/20/2017  . Overdose [T50.901A] 09/20/2017  . Acute cystitis with hematuria [N30.01]   . Suicide attempt ([place]) [T14.91XA]   . Family history of diabetes mellitus- mom in 36's [Z83.3] 08/19/2017    Class: Chronic  . Family history of colon cancer- sister died 18yo [Z80.0] 08-19-17  . Adjustment disorder with mixed anxiety < depressed mood [F43.23] 08/19/2017  . B12 deficiency [E53.8] August 19, 2017  . Fibromyalgia syndrome [M79.7] 11/23/2016  . Other insomnia [G47.09] 11/23/2016  . Trapezius muscle spasm [M62.838] 11/23/2016  . DDD (degenerative disc disease), cervical [M50.30] 11/23/2016  . DDD (degenerative disc disease), lumbar [M51.36] 11/23/2016  . Other idiopathic scoliosis, lumbar region [M41.26] 11/23/2016  . Malignant neoplasm of female breast ([place]) [C50.919] 11/23/2016  . History of gastroesophageal reflux (GERD) [Z87.19] 11/23/2016  . PCP NOTES >>>>>>>>>>>>>>>>>>>> [Z09] 11/21/2015  . Constipation [K59.00] 02/15/2015  . Annual physical exam [Z00.00] 11/20/2013  . COPD (chronic obstructive pulmonary disease) ([place]) [J44.9] 11/20/2013  . Unspecified adverse effect of unspecified drug, medicinal and biological substance [T88.7XXA] 12/10/2011  . Vitamin D deficiency [E55.9] 07/06/2011  . CARDIOMYOPATHY, PRIMARY, DILATED [I42.8] 01/01/2009  . BREAST CANCER, HX OF [Z85.3] 11/02/2008  . h/o HYPERLIPIDEMIA [E78.2] 10/18/2007  . GERD [530.1] 10/18/2007  . ANEMIA, B12 DEFICIENCY [D51.8] 10/12/2007  . Anxiety,depression, insomnia [F41.9, F32.9] 10/12/2007  . IBS [K58.9] 10/12/2007  . Primary osteoarthritis of both hands  [M19.041, M19.042] 10/12/2007  . Osteoporosis [M81.0] 10/12/2007  . COLONIC POLYPS, HX OF [Z86.010] 10/12/2007  . MIGRAINES, HX OF [Z87.898] 10/12/2007  . Fibromyalgia--Dr. Herold Harms, on Ultram [M79.7] 07/14/2007    Total Time spent with patient: 30 minutes  Musculoskeletal: Strength & Muscle Tone: within normal limits Gait & Station: normal Patient leans: N/A  Psychiatric Specialty Exam: ROS denies headache, no chest pain, no shortness of breath, no vomiting , no fever  Describes chronic neck pain  Blood pressure 113/60, pulse 93, temperature 98 F (36.7 C), temperature source Oral, resp. rate 20, height 5' (1.524 m), weight 42.6 kg (94 lb).Body mass index is 18.36 kg/m.  General Appearance: Well Groomed  Eye Contact::  Good  Speech:  Normal Rate409  Volume:  Normal  Mood:  improved mood , states mood is "OK", denies depression, and at this time presents euthymic  Affect:  Appropriate and Full Range  Thought Process:  Linear and Descriptions of Associations: Intact  Orientation:  Full (Time, Place, and Person)  Thought Content:  no hallucinations, no delusions, not internally preoccupied   Suicidal Thoughts:  No denies any suicidal or self injurious ideations, denies any homicidal or violent ideations . Specifically also denies any violent or homicidal ideations towards her husband  Homicidal Thoughts:  No  Memory:  recent and remote grossly intact   Judgement:  Other:  improving   Insight:  improving   Psychomotor Activity:  Normal  Concentration:  Good  Recall:  Good  Fund of Knowledge:Good  Language: Good  Akathisia:  Negative  Handed:  Right  AIMS (if indicated):     Assets:  Communication Skills Desire for Improvement Resilience  Sleep:  Number of Hours: 6.25  Cognition: WNL  ADL's:  Intact   Mental Status Per Nursing Assessment::  On Admission:     Demographic Factors:  67 year old female, married, lives with husband , has one adult daughter , retired     Loss Factors: Marital tension  Historical Factors: History of depression, no prior psychiatric admissions, no history of suicidal attempts   Risk Reduction Factors:   Sense of responsibility to family, Living with another person, especially a relative and Positive coping skills or problem solving skills  Continued Clinical Symptoms:  At this time patient reports she is feeling better, and denies feeling depressed. Presents alert,attentive, well groomed, euthymic ,with full range of affect . No thought disorder, no suicidal or self injurious ideations, no homicidal or violent ideations, no hallucinations, no delusions, not internally preoccupied . Denies medication side effects.  Behavior on unit in good control, pleasant on approach. States she had a good visit with her husband and states " we made up", and states this is helping her feel better, and that she is no longer feeling angry with him.  Cognitive Features That Contribute To Risk:  No gross cognitive deficits noted upon discharge. Is alert , attentive, and oriented x 3   Suicide Risk:  Mild:  Suicidal ideation of limited frequency, intensity, duration, and specificity.  There are no identifiable plans, no associated intent, mild dysphoria and related symptoms, good self-control (both objective and subjective assessment), few other risk factors, and identifiable protective factors, including available and accessible social support.  Follow-up Information    [facility] Follow up.   Why:  Appointment is Wednesday, 09/30/17 at 9:00am with you PCP, Dr. Raliegh Scarlet. Please be sure to bring any discharge information and medication lists.  Contact information: [place] [id number] [phone]       [facility] Follow up on 10/01/2017.   Specialty:  Behavioral Health Why:  Hospital follow-up on Friday 2/1 at 9:15AM. Please bring photo ID and insurance card. Thank you.   Contact information: [place] [address] [phone]           Plan Of Care/Follow-up recommendations:  Activity:  as tolerated  Diet:  regular Tests:  NA Other:  see below Patient is expressing readiness for discharge, she is leaving unit in good spirits  Plans to return home Follow up as above  She has an established PCP , Dr. Normajean Baxter for medical issues as needed  Jenne Campus, MD 09/25/2017, 9:33 AM

## 2017-09-29 ENCOUNTER — Ambulatory Visit: Payer: Medicare Other | Admitting: Family Medicine

## 2017-09-30 ENCOUNTER — Ambulatory Visit (INDEPENDENT_AMBULATORY_CARE_PROVIDER_SITE_OTHER): Payer: Medicare Other | Admitting: Family Medicine

## 2017-09-30 ENCOUNTER — Encounter: Payer: Self-pay | Admitting: Family Medicine

## 2017-09-30 VITALS — BP 116/68 | HR 85 | Ht 60.0 in | Wt 97.0 lb

## 2017-09-30 DIAGNOSIS — F332 Major depressive disorder, recurrent severe without psychotic features: Secondary | ICD-10-CM | POA: Diagnosis not present

## 2017-09-30 DIAGNOSIS — R4589 Other symptoms and signs involving emotional state: Secondary | ICD-10-CM | POA: Diagnosis not present

## 2017-09-30 DIAGNOSIS — F5105 Insomnia due to other mental disorder: Secondary | ICD-10-CM | POA: Diagnosis not present

## 2017-09-30 DIAGNOSIS — T50902D Poisoning by unspecified drugs, medicaments and biological substances, intentional self-harm, subsequent encounter: Secondary | ICD-10-CM

## 2017-09-30 DIAGNOSIS — T50902S Poisoning by unspecified drugs, medicaments and biological substances, intentional self-harm, sequela: Secondary | ICD-10-CM | POA: Diagnosis not present

## 2017-09-30 NOTE — Progress Notes (Signed)
Impression and Recommendations:    1. Suicide attempt by drug ingestion, subsequent encounter   2. Suicide and self-inflicted poisoning by drug or medicinal substance, sequela ([name])   3. At risk for suicide   4. Severe episode of recurrent major depressive disorder, without psychotic features ([name])   5. Insomnia due to mental disorder     1. Suicide attempt by drug ingestion, subsequent encounter- discussed with pt stress management techniques, make sure she had suicide hotline number and who to contact if she feels this again. She denies SI currently and is emotionally stable today.  2. Suicide and self-inflicted poisoning by drug or medicinal substance- She no longer has this medication at her house.  3. At risk for suicide- Follow up with [facility] psychiatry to see physician for medical management and counseling services. Discussed importance of her having regular Fups with counselors.   4. Severe episode of recurrent major depressive disorder-continue meds- further management per psychiatry of medications.  5. Insomnia- ask psychiatry at [facility] if trazodone is appropriate, but since you took them in the hospital and it worked well, use PRN.  -Stress management techniques discussed including going for 5-10 minute walks, or going into a quiet room for meditation or prayer. If she feels badly again, call a friend, EMS, or cops to have somebody help her in the situation. Use the card given by [name] behavioral health in order for psychiatry urgent care to meet you.  -Exercise daily 15-20 minutes daily, twice a day, -Journal positive experiences daily -Meditate or pray daily -Keep follow up appointment on 10-14-17 to discuss [name] she will get prior.  No orders of the defined types were placed in this encounter.   No orders of the defined types were placed in this encounter.   Gross side effects, risk and benefits, and alternatives of medications and treatment plan in  general discussed with patient.  Patient is aware that all medications have potential side effects and we are unable to predict every side effect or drug-drug interaction that may occur.   Patient will call with any questions prior to using medication if they have concerns.  Expresses verbal understanding and consents to current therapy and treatment regimen.  No barriers to understanding were identified.  Red flag symptoms and signs discussed in detail.  Patient expressed understanding regarding what to do in case of emergency\urgent symptoms  Please see AVS handed out to patient at the end of our visit for further patient instructions/ counseling done pertaining to today's office visit.   Return for Please keep your follow-up appointment with me on February 14 in 2 wks discuss fasting blood  work.    Note: This note was prepared with assistance of Dragon voice recognition software. Occasional wrong-word or sound-a-like substitutions may have occurred due to the inherent limitations of voice recognition software.  This document serves as a record of services personally performed by Mellody Dance, DO. It was created on her behalf by Mayer Masker, a trained medical scribe. The creation of this record is based on the scribe's personal observations and the provider's statements to them.   I have reviewed the above medical documentation for accuracy and completeness and I concur.  Mellody Dance 10/14/17 10:53 AM   --------------------------------------------------------------------------------------------------------------------------------------------------------------------------------------------------------------------------------------------    Subjective:     HPI: [name] is a 67 y.o. female who presents to [place] [facility] today for hospital follow up from 09-20-17.  Suicidal ideations: She  took some hydroxyzine pills after she and her husband got  into a bad argument and she "sort of flipped out". She is feeling "okay" now. Emotionally, she feels anxious and shaky. She had a previous counselor, but she had a falling out with them. She was given citalopram and trazodone while she was in the hospital. She uses the trazodone to sleep on occasion as-needed, but she is unsure if she has a prescription at home. She is following up with [facility] psychiatry for further evaluation on 10-01-17. She states she has anger management issues. She does not feel suicidal ideations presently. She denies hearing voices or seeing things.    Wt Readings from Last 3 Encounters:  10/14/17 94 lb 6.4 oz (42.8 kg)  09/30/17 97 lb (44 kg)  09/22/17 96 lb 14.4 oz (44 kg)   BP Readings from Last 3 Encounters:  10/14/17 111/69  09/30/17 116/68  09/22/17 114/71   Pulse Readings from Last 3 Encounters:  10/14/17 74  09/30/17 85  09/22/17 72   BMI Readings from Last 3 Encounters:  10/14/17 18.44 kg/m  09/30/17 18.94 kg/m  09/22/17 18.92 kg/m     Patient Care Team    Relationship Specialty Notifications Start End  Mellody Dance, DO PCP - General Family Medicine  2017-08-29   Bo Merino, MD Consulting Physician Rheumatology  10/02/14   West Pugh, NP (Inactive) Nurse Practitioner Gynecology  10/02/14   Richmond Campbell, MD Consulting Physician Gastroenterology  08/29/17   Druscilla Brownie, MD Consulting Physician Dermatology  08/29/2017      Patient Active Problem List   Diagnosis Date Noted  . Family history of colon cancer- sister died 72yo August 29, 2017    Priority: High  . Adjustment disorder with mixed anxiety < depressed mood 08/29/17    Priority: High  . Malignant neoplasm of female breast ([place]) 11/23/2016    Priority: High  . COPD (chronic obstructive pulmonary disease) ([place]) 11/20/2013    Priority: High  . h/o HYPERLIPIDEMIA 10/18/2007    Priority: High  . History of gastroesophageal reflux (GERD) 11/23/2016    Priority: Medium    . CARDIOMYOPATHY, PRIMARY, DILATED 01/01/2009    Priority: Medium  . ANEMIA, B12 DEFICIENCY 10/12/2007    Priority: Medium  . Anxiety,depression, insomnia 10/12/2007    Priority: Medium  . IBS 10/12/2007    Priority: Medium  . Fibromyalgia--Dr. Herold Harms, on Ultram 07/14/2007    Priority: Medium  . Family history of diabetes mellitus- mom in 50's 08-29-17    Priority: Low    Class: Chronic  . B12 deficiency 08-29-2017    Priority: Low  . Other insomnia 11/23/2016    Priority: Low  . Vitamin D deficiency 07/06/2011    Priority: Low  . Osteoporosis 10/12/2007    Priority: Low  . MDD (major depressive disorder), severe ([place]) 09/22/2017  . MDD (major depressive disorder), recurrent episode, moderate ([place])   . Prolonged Q-T interval on ECG 09/20/2017  . Overdose 09/20/2017  . Acute cystitis with hematuria   . Suicide attempt ([place])   . Fibromyalgia syndrome 11/23/2016  . Trapezius muscle spasm 11/23/2016  . DDD (degenerative disc disease), cervical 11/23/2016  . DDD (degenerative disc disease), lumbar 11/23/2016  . Other idiopathic scoliosis, lumbar region 11/23/2016  . PCP NOTES >>>>>>>>>>>>>>>>>>>> 11/21/2015  . Constipation 02/15/2015  . Annual physical exam 11/20/2013  . Unspecified adverse effect of unspecified drug, medicinal and biological substance 12/10/2011  . BREAST CANCER, HX OF 11/02/2008  . GERD 10/18/2007  . Primary osteoarthritis  of both hands 10/12/2007  . COLONIC POLYPS, HX OF 10/12/2007  . MIGRAINES, HX OF 10/12/2007    Past Medical history, Surgical history, Family history, Social history, Allergies and Medications have been entered into the medical record, reviewed and changed as needed.    Current Meds  Medication Sig  . albuterol (PROAIR HFA) 108 (90 Base) MCG/ACT inhaler Inhale 2 puffs into the lungs every 4 (four) hours as needed for wheezing or shortness of breath.  . budesonide-formoterol (SYMBICORT) 80-4.5 MCG/ACT inhaler Inhale 2 puffs into  the lungs 2 (two) times daily.  . cetirizine (ZYRTEC) 10 MG tablet Take 10 mg by mouth daily.  . citalopram (CELEXA) 40 MG tablet Take 1 tablet (40 mg total) by mouth daily. For mood control  . methocarbamol (ROBAXIN) 750 MG tablet TAKE 1 TABLET (750 MG TOTAL) BY MOUTH 2 (TWO) TIMES DAILY AS NEEDED FOR MUSCLE SPASMS.  . Probiotic Product (PROBIOTIC PO) Take 1 tablet by mouth daily.    Allergies:  Allergies  Allergen Reactions  . Amoxicillin-Pot Clavulanate     diarrhea  . Doxycycline     Nausea & vomiting  . Venlafaxine     ? Reaction; ? Blurred vision     Review of Systems:  A fourteen system review of systems was performed and found to be positive as per HPI.   Objective:   Blood pressure 116/68, pulse 85, height 5' (1.524 m), weight 97 lb (44 kg), SpO2 98 %. Body mass index is 18.94 kg/m. General:  Well Developed, well nourished, appropriate for stated age.  Neuro:  Alert and oriented,  extra-ocular muscles intact  HEENT:  Normocephalic, atraumatic, neck supple, no carotid bruits appreciated  Skin:  no gross rash, warm, pink. Cardiac:  RRR, S1 S2 Respiratory:  ECTA B/L and A/P, Not using accessory muscles, speaking in full sentences- unlabored. Vascular:  Ext warm, no cyanosis apprec.; cap RF less 2 sec. Psych:  No SI/HI presently, judgement and insight good, Euthymic mood. Full Affect.

## 2017-09-30 NOTE — Patient Instructions (Signed)
Patient has a follow-up at [facility] psychiatry on 2\1\19.  Please make sure you ask them about not only seeing a physician for medical management and medication refills but also a psychologist for counseling and I feel you also need on a regular basis.  Also looks like trazodone was called into [facility] on [address].  Please make sure you go get this prescription and/or call them first to confirm they have it.  Then make sure you ask [facility] psychiatry if this is appropriate and feel it is an okay medicine for you or not.  This medicine will be further refilled by psychiatry along with your citalopram  -Stress management techniques discussed with patient such as if your husband upset you you leave the room and go for a walk for 5-10 minutes.  Or go into a quiet room and read a book or, meditation or prayer etc.  Also if you feel really badly like that again you can call a friend up to call EMS let them know call the cops let them know you are feeling this way and have somebody come and help you.  Also even better use that little card with the number that [facility] gave you who said if you were in dire need you could call them and ask for help    Suicidal Feelings: How to Help Yourself Suicide is the taking of one's own life. If you feel as though life is getting too tough to handle and are thinking about suicide, get help right away. To get help:  Call your local emergency services (911 in the [place]  Call a suicide hotline to speak with a trained counselor who understands how you are feeling. The following is a list of suicide hotlines in the [place]. For a list of hotlines in [place], visit FindSkins.pl. ? 1-800-273-TALK (501)720-5605). ? 1-800-SUICIDE 248 550 7744). ? (226)480-2763. This is a hotline for Spanish speakers. ? 1-800-799-4TTY 4304549069). This is a hotline for TTY users. ? 1-866-4-U-TREVOR  254-597-6578). This is a hotline for lesbian, gay, bisexual, transgender, or questioning youth.  Contact a crisis center or a local suicide prevention center. To find a crisis center or suicide prevention center: ? Call your local hospital, clinic, community service organization, mental health center, social service provider, or health department. Ask for assistance in connecting to a crisis center. ? Visit BankingRep.com.au for a list of crisis centers in the [place], or visit www.suicideprevention.ca/thinking-about-suicide/find-a-crisis-centre for a list of centers in [place].  Visit the following websites: ? National Suicide Prevention Lifeline: www.suicidepreventionlifeline.org ? Hopeline: www.hopeline.com ? [employer] for Suicide Prevention: PromotionalLoans.co.za ? [employer] (for lesbian, gay, bisexual, transgender, or questioning youth): www.thetrevorproject.org  How can I help myself feel better?  Promise yourself that you will not do anything drastic when you have suicidal feelings. Remember, there is hope. Many people have gotten through suicidal thoughts and feelings, and you will, too. You may have gotten through them before, and this proves that you can get through them again.  Let family, friends, teachers, or counselors know how you are feeling. Try not to isolate yourself from those who care about you. Remember, they will want to help you. Talk with someone every day, even if you do not feel sociable. Face-to-face conversation is best.  Call a mental health professional and see one regularly.  Visit your primary health care provider every year.  Eat a well-balanced diet, and space your meals so you eat regularly.  Get plenty of rest.  Avoid alcohol and drugs, and remove them from your home. They will only make you feel worse.  If you are thinking of taking a lot of medicine, give your medicine to someone who can give it  to you one day at a time. If you are on antidepressants and are concerned you will overdose, let your health care provider know so he or she can give you safer medicines. Ask your mental health professional about the possible side effects of any medicines you are taking.  Remove weapons, poisons, knives, and anything else that could harm you from your home.  Try to stick to routines. Follow a schedule every day. Put self-care on your schedule.  Make a list of realistic goals, and cross them off when you achieve them. Accomplishments give a sense of worth.  Wait until you are feeling better before doing the things you find difficult or unpleasant.  Exercise if you are able. You will feel better if you exercise for even a half hour each day.  Go out in the sun or into nature. This will help you recover from depression faster. If you have a favorite place to walk, go there.  Do the things that have always given you pleasure. Play your favorite music, read a good book, paint a picture, play your favorite instrument, or do anything else that takes your mind off your depression if it is safe to do.  Keep your living space well lit.  When you are feeling well, write yourself a letter about tips and support that you can read when you are not feeling well.  Remember that life's difficulties can be sorted out with help. Conditions can be treated. You can work on thoughts and strategies that serve you well. This information is not intended to replace advice given to you by your health care provider. Make sure you discuss any questions you have with your health care provider. Document Released: 02/21/2003 Document Revised: 04/15/2016 Document Reviewed: 12/12/2013 Elsevier Interactive Patient Education  2018 [place] we need to transition your brain into thinking more positively.  These tasks below are some things I want you to do every day 1)  write 3 new things that you are  grateful for every day for 21 days  2)  exercise daily- walk for 15 minutes twice a day every day 3)  you are going to journal every day about one positive experience that you had 4)  meditate every day.  You can go on YouTube and look for 15-minute relaxation meditation or what ever.  But we need to make sure that you are in the moment and relaxing and deep breathing every day 5)  Write 1 positive email every day to praise someone in your life     - If you have insomnia or difficulty sleeping, this information is for you:  - Avoid caffeinated beverages after lunch,  no alcoholic beverages,  no eating within 2-3 hours of lying down,  avoid exposure to blue light before bed,  avoid daytime naps, and  needs to maintain a regular sleep schedule- go to sleep and wake up around the same time every night.   - Resolve concerns or worries before entering bedroom:  Discussed relaxation techniques with patient and to keep a journal to write down fears\ worries.  I suggested seeing a counselor for CBT.   - Recommend patient meditate or do deep breathing exercises to help relax.   Incorporate the use  of white noise machines or listen to "sleep meditation music", or recordings of guided meditations for sleep from YouTube which are free, such as  "guided meditation for detachment from over thinking"  by Mayford Knife.

## 2017-10-04 ENCOUNTER — Other Ambulatory Visit (INDEPENDENT_AMBULATORY_CARE_PROVIDER_SITE_OTHER): Payer: Medicare Other

## 2017-10-04 DIAGNOSIS — M797 Fibromyalgia: Secondary | ICD-10-CM

## 2017-10-04 DIAGNOSIS — M810 Age-related osteoporosis without current pathological fracture: Secondary | ICD-10-CM | POA: Diagnosis not present

## 2017-10-04 DIAGNOSIS — E538 Deficiency of other specified B group vitamins: Secondary | ICD-10-CM

## 2017-10-04 DIAGNOSIS — K589 Irritable bowel syndrome without diarrhea: Secondary | ICD-10-CM

## 2017-10-04 DIAGNOSIS — E782 Mixed hyperlipidemia: Secondary | ICD-10-CM | POA: Diagnosis not present

## 2017-10-04 DIAGNOSIS — E559 Vitamin D deficiency, unspecified: Secondary | ICD-10-CM

## 2017-10-04 DIAGNOSIS — D518 Other vitamin B12 deficiency anemias: Secondary | ICD-10-CM

## 2017-10-04 DIAGNOSIS — Z833 Family history of diabetes mellitus: Secondary | ICD-10-CM | POA: Diagnosis not present

## 2017-10-05 LAB — HEMOGLOBIN A1C
ESTIMATED AVERAGE GLUCOSE: 111 mg/dL
Hgb A1c MFr Bld: 5.5 % (ref 4.8–5.6)

## 2017-10-05 LAB — CBC WITH DIFFERENTIAL/PLATELET
BASOS: 1 %
Basophils Absolute: 0 10*3/uL (ref 0.0–0.2)
EOS (ABSOLUTE): 0.1 10*3/uL (ref 0.0–0.4)
EOS: 1 %
HEMOGLOBIN: 11.7 g/dL (ref 11.1–15.9)
Hematocrit: 36.9 % (ref 34.0–46.6)
IMMATURE GRANS (ABS): 0 10*3/uL (ref 0.0–0.1)
IMMATURE GRANULOCYTES: 0 %
LYMPHS: 28 %
Lymphocytes Absolute: 1.6 10*3/uL (ref 0.7–3.1)
MCH: 29.6 pg (ref 26.6–33.0)
MCHC: 31.7 g/dL (ref 31.5–35.7)
MCV: 93 fL (ref 79–97)
MONOCYTES: 9 %
Monocytes Absolute: 0.5 10*3/uL (ref 0.1–0.9)
NEUTROS ABS: 3.5 10*3/uL (ref 1.4–7.0)
NEUTROS PCT: 61 %
Platelets: 458 10*3/uL — ABNORMAL HIGH (ref 150–379)
RBC: 3.95 x10E6/uL (ref 3.77–5.28)
RDW: 13.2 % (ref 12.3–15.4)
WBC: 5.7 10*3/uL (ref 3.4–10.8)

## 2017-10-05 LAB — COMPREHENSIVE METABOLIC PANEL
ALT: 14 IU/L (ref 0–32)
AST: 22 IU/L (ref 0–40)
Albumin/Globulin Ratio: 1.6 (ref 1.2–2.2)
Albumin: 4.4 g/dL (ref 3.6–4.8)
Alkaline Phosphatase: 75 IU/L (ref 39–117)
BUN/Creatinine Ratio: 19 (ref 12–28)
BUN: 18 mg/dL (ref 8–27)
Bilirubin Total: 0.2 mg/dL (ref 0.0–1.2)
CALCIUM: 9.7 mg/dL (ref 8.7–10.3)
CO2: 26 mmol/L (ref 20–29)
CREATININE: 0.94 mg/dL (ref 0.57–1.00)
Chloride: 101 mmol/L (ref 96–106)
GFR calc Af Amer: 73 mL/min/{1.73_m2} (ref >59)
GFR, EST NON AFRICAN AMERICAN: 63 mL/min/{1.73_m2} (ref >59)
GLOBULIN, TOTAL: 2.8 g/dL (ref 1.5–4.5)
GLUCOSE: 94 mg/dL (ref 65–99)
Potassium: 4.6 mmol/L (ref 3.5–5.2)
SODIUM: 142 mmol/L (ref 134–144)
Total Protein: 7.2 g/dL (ref 6.0–8.5)

## 2017-10-05 LAB — VITAMIN B12: Vitamin B-12: 876 pg/mL (ref 232–1245)

## 2017-10-05 LAB — LIPID PANEL
CHOLESTEROL TOTAL: 230 mg/dL — AB (ref 100–199)
Chol/HDL Ratio: 2.5 ratio (ref 0.0–4.4)
HDL: 91 mg/dL (ref >39)
LDL Calculated: 124 mg/dL — ABNORMAL HIGH (ref 0–99)
TRIGLYCERIDES: 73 mg/dL (ref 0–149)
VLDL CHOLESTEROL CAL: 15 mg/dL (ref 5–40)

## 2017-10-05 LAB — TSH: TSH: 1.24 u[IU]/mL (ref 0.450–4.500)

## 2017-10-05 LAB — VITAMIN D 25 HYDROXY (VIT D DEFICIENCY, FRACTURES): VIT D 25 HYDROXY: 24.2 ng/mL — AB (ref 30.0–100.0)

## 2017-10-14 ENCOUNTER — Ambulatory Visit (INDEPENDENT_AMBULATORY_CARE_PROVIDER_SITE_OTHER): Payer: Medicare Other | Admitting: Family Medicine

## 2017-10-14 ENCOUNTER — Encounter: Payer: Self-pay | Admitting: Family Medicine

## 2017-10-14 VITALS — BP 111/69 | HR 74 | Ht 60.0 in | Wt 94.4 lb

## 2017-10-14 DIAGNOSIS — F322 Major depressive disorder, single episode, severe without psychotic features: Secondary | ICD-10-CM | POA: Diagnosis not present

## 2017-10-14 DIAGNOSIS — T1491XA Suicide attempt, initial encounter: Secondary | ICD-10-CM

## 2017-10-14 DIAGNOSIS — M7701 Medial epicondylitis, right elbow: Secondary | ICD-10-CM | POA: Diagnosis not present

## 2017-10-14 DIAGNOSIS — M810 Age-related osteoporosis without current pathological fracture: Secondary | ICD-10-CM | POA: Diagnosis not present

## 2017-10-14 DIAGNOSIS — D518 Other vitamin B12 deficiency anemias: Secondary | ICD-10-CM | POA: Diagnosis not present

## 2017-10-14 DIAGNOSIS — J449 Chronic obstructive pulmonary disease, unspecified: Secondary | ICD-10-CM

## 2017-10-14 DIAGNOSIS — E559 Vitamin D deficiency, unspecified: Secondary | ICD-10-CM | POA: Diagnosis not present

## 2017-10-14 DIAGNOSIS — Z833 Family history of diabetes mellitus: Secondary | ICD-10-CM

## 2017-10-14 DIAGNOSIS — E782 Mixed hyperlipidemia: Secondary | ICD-10-CM

## 2017-10-14 DIAGNOSIS — M8580 Other specified disorders of bone density and structure, unspecified site: Secondary | ICD-10-CM

## 2017-10-14 DIAGNOSIS — F39 Unspecified mood [affective] disorder: Secondary | ICD-10-CM | POA: Diagnosis not present

## 2017-10-14 MED ORDER — CALCIUM CARBONATE-VITAMIN D 600-400 MG-UNIT PO TABS: 1.0000 | ORAL_TABLET | Freq: Two times a day (BID) | ORAL | 11 refills | Status: DC

## 2017-10-14 MED ORDER — VITAMIN D (ERGOCALCIFEROL) 1.25 MG (50000 UNIT) PO CAPS: 50000.0000 [IU] | ORAL_CAPSULE | ORAL | 10 refills | Status: DC

## 2017-10-14 NOTE — Progress Notes (Signed)
Assessment and plan:  1. Chronic obstructive pulmonary disease, unspecified COPD type ([place])   2. Suicide attempt ([place])   3. MDD (major depressive disorder), severe ([place])   4. Mood disorder ([place])   5. h/o HYPERLIPIDEMIA   6. ANEMIA, B12 DEFICIENCY   7. Vitamin D deficiency   8. Family history of diabetes mellitus- mom in 18's   9. Medial epicondylitis of right elbow   10. Osteopenia after menopause       1. COPD- well controlled in office today and pt is stable at this time. Continue taking meds as listed below.  2. Adjustment disorder with mixed anxiety < depressed mood- Pt strongly recommended to go to group or individual counseling every week.   -Call 911 or the office if feeling suicidal. Declines those feelings today  3. H/o hyperlipidemia- ASCVD 10 year risk is 4.7%.    Dietary and exercise guidelines discussed with patient.  Recommended pt to reduce intake of saturated, trans fats and fatty carbohydrates.   Limit chocolate and butter intake.   Handouts provided if desired.  4. Anemia, b12 deficiency- pt instructed to continue taking b12 supplements as listed below.  5. Vitamin D deficiency- start supplements as listed below, 50,000 IUs once weekly. Continue taking OTC calcium-vitamin D supplement daily as listed below. Recheck vit D in 4 months.   6. FMHx DM- sugars are well controlled and pt is stable at this time.  7. Medial epicondylitis of R elbow- Ice 15-20 minutes to the area, 3-4 times per day. Avoid flexing the area and other aggravating activities. Use a brace on the forearm. If this does not work, will refer pt to sports medicine for further evaluation.  8. Osteopenia- Bone density referral given. Pt strongly recommended to follow up for this referral.  -Follow up in 6 weeks for routine chronic care and health management.     Education and routine counseling performed. Handouts provided.  Orders  Placed This Encounter  Procedures  . DG Bone Density    Meds ordered this encounter  Medications  . Vitamin D, Ergocalciferol, (DRISDOL) 50000 units CAPS capsule    Sig: Take 1 capsule (50,000 Units total) by mouth every 7 (seven) days.    Dispense:  12 capsule    Refill:  10  . Calcium Carbonate-Vitamin D 600-400 MG-UNIT tablet    Sig: Take 1 tablet by mouth 2 (two) times daily.    Dispense:  60 tablet    Refill:  11     Return in about 6 weeks (around 11/25/2017).   Anticipatory guidance and routine counseling done re: condition, txmnt options and need for follow up. All questions of patient's were answered.   Gross side effects, risk and benefits, and alternatives of medications discussed with patient.  Patient is aware that all medications have potential side effects and we are unable to predict every sideeffect or drug-drug interaction that may occur.  Expresses verbal understanding and consents to current therapy plan and treatment regiment.  Please see AVS handed out to patient at the end of our visit for additional patient instructions/ counseling done pertaining to today's office visit.  Note: This document was prepared using Dragon voice recognition software and may include unintentional dictation errors.   Pt was in the office today for 40+ minutes, with over 50% time spent in face to face counseling of patients various medical conditions, treatment plans of those medical conditions including medicine management and lifestyle modification, strategies to  improve health and well being; and in coordination of care. SEE ABOVE FOR DETAILS  This document serves as a record of services personally performed by Rachael Dance, DO. It was created on her behalf by [name], a trained medical scribe. The creation of this record is based on the scribe's personal observations and the provider's statements to them.   I have reviewed the above medical documentation for accuracy and  completeness and I concur.  [name] 11/06/17 4:40 PM  ----------------------------------------------------------------------------------------------------------------------  Subjective:   CC:   [name] is a 67 y.o. female who presents to [place] at [facility] today for review and discussion of recent bloodwork that was done.  1. All recent blood work that we ordered was reviewed with patient today.  Patient was counseled on all abnormalities and we discussed dietary and lifestyle changes that could help those values (also medications when appropriate).  Extensive health counseling performed and all patient's concerns/ questions were addressed.   Respiratory: She states she gets out of breath after walking long distances. Today, her breathing is well-controlled. She states going out in the cold, she can't breathe through her nose and has rhinorrhea and has difficulty breathing.   She had a pulmonary function test on 03-22-2017. This showed severe COPD emphysema type reversible component.   R elbow-  She states she had pain that first started on the outside of her R elbow for 2-3 months. This has now worsened and went to the inside of her elbow. Shutting doors makes the pain worse, as well as picking something up. She has a h/o carpal tunnel on her L hand. She denies numbness/tingling in her hand.  B12: she takes vitamin B12 sporadically.  Vitamin D: pt has a h/o osteopenia. Pt takes her supplements only sporadically.   She does not take a lot of water. She states she loves drinking coca cola in the morning.   She has not been exercising regularly. She does not eat red meat.  Pt has never been on cholesterol medications   Mood: she states her mood is "okay". She states her daughter is mad at her for her previous hospitalization for attempted suicide. She goes once a month for a counselor, as well as group therapy in between these appointments. She  denies SI.   ASCVD: The 10-year ASCVD risk score Mikey Bussing DC Jr., et al., 2013) is: 4.7%   Values used to calculate the score:     Age: 80 years     Sex: Female     Is Non-Hispanic African American: No     Diabetic: No     Tobacco smoker: No     Systolic Blood Pressure: 675 mmHg     Is BP treated: No     HDL Cholesterol: 91 mg/dL     Total Cholesterol: 230 mg/dL  Depression screen [facility] 2/9 10/14/2017 09/30/2017 08/03/2017 03/22/2017 11/21/2015  Decreased Interest 2 3 0 0 0  Down, Depressed, Hopeless 2 3 0 0 0  PHQ - 2 Score 4 6 0 0 0  Altered sleeping 2 3 - - -  Tired, decreased energy 3 3 - - -  Change in appetite 2 2 - - -  Feeling bad or failure about yourself  3 3 - - -  Trouble concentrating 2 2 - - -  Moving slowly or fidgety/restless 1 2 - - -  Suicidal thoughts 1 3 - - -  PHQ-9 Score 18 24 - - -  Difficult doing work/chores Very difficult Very difficult - - -    Wt Readings from Last 3 Encounters:  10/14/17 94 lb 6.4 oz (42.8 kg)  09/30/17 97 lb (44 kg)  09/22/17 96 lb 14.4 oz (44 kg)   BP Readings from Last 3 Encounters:  10/14/17 111/69  09/30/17 116/68  09/22/17 114/71   Pulse Readings from Last 3 Encounters:  10/14/17 74  09/30/17 85  09/22/17 72   BMI Readings from Last 3 Encounters:  10/14/17 18.44 kg/m  09/30/17 18.94 kg/m  09/22/17 18.92 kg/m     Patient Care Team    Relationship Specialty Notifications Start End  Rachael Dance, DO PCP - General Family Medicine  08-31-2017   Bo Merino, MD Consulting Physician Rheumatology  10/02/14   West Pugh, NP (Inactive) Nurse Practitioner Gynecology  10/02/14   Richmond Campbell, MD Consulting Physician Gastroenterology  Aug 31, 2017   Druscilla Brownie, MD Consulting Physician Dermatology  08/31/17     Full medical history updated and reviewed in the office today  Patient Active Problem List   Diagnosis Date Noted  . Family history of colon cancer- sister died 26yo 08/31/17    Priority: High  .  Adjustment disorder with mixed anxiety < depressed mood 2017-08-31    Priority: High  . Malignant neoplasm of female breast ([place]) 11/23/2016    Priority: High  . COPD (chronic obstructive pulmonary disease) ([place]) 11/20/2013    Priority: High  . h/o HYPERLIPIDEMIA 10/18/2007    Priority: High  . History of gastroesophageal reflux (GERD) 11/23/2016    Priority: Medium  . CARDIOMYOPATHY, PRIMARY, DILATED 01/01/2009    Priority: Medium  . ANEMIA, B12 DEFICIENCY 10/12/2007    Priority: Medium  . Anxiety,depression, insomnia 10/12/2007    Priority: Medium  . IBS 10/12/2007    Priority: Medium  . Fibromyalgia--Dr. Herold Harms, on Ultram 07/14/2007    Priority: Medium  . Family history of diabetes mellitus- mom in 50's 08-31-17    Priority: Low    Class: Chronic  . B12 deficiency Aug 31, 2017    Priority: Low  . Other insomnia 11/23/2016    Priority: Low  . Vitamin D deficiency 07/06/2011    Priority: Low  . Osteoporosis 10/12/2007    Priority: Low  . Osteopenia after menopause 11/06/2017  . Medial epicondylitis of right elbow 10/14/2017  . MDD (major depressive disorder), severe ([place]) 09/22/2017  . MDD (major depressive disorder), recurrent episode, moderate ([place])   . Prolonged Q-T interval on ECG 09/20/2017  . Overdose 09/20/2017  . Acute cystitis with hematuria   . Suicide attempt ([place])   . Fibromyalgia syndrome 11/23/2016  . Trapezius muscle spasm 11/23/2016  . DDD (degenerative disc disease), cervical 11/23/2016  . DDD (degenerative disc disease), lumbar 11/23/2016  . Other idiopathic scoliosis, lumbar region 11/23/2016  . PCP NOTES >>>>>>>>>>>>>>>>>>>> 11/21/2015  . Constipation 02/15/2015  . Annual physical exam 11/20/2013  . Unspecified adverse effect of unspecified drug, medicinal and biological substance 12/10/2011  . BREAST CANCER, HX OF 11/02/2008  . GERD 10/18/2007  . Primary osteoarthritis of both hands 10/12/2007  . COLONIC POLYPS, HX OF 10/12/2007  .  MIGRAINES, HX OF 10/12/2007    Past Medical History:  Diagnosis Date  . Anemia    B12 deficient  . B12 deficiency   . Breast cancer ([place])    left breast.  . Chronic SI joint pain    Bilateral, Dr. Estanislado Pandy  . Fibromyalgia    Dr. Estanislado Pandy  . GERD (gastroesophageal reflux disease)   .  Greater trochanteric bursitis of both hips    Dr. Estanislado Pandy  . Hyperlipidemia   . IBS (irritable bowel syndrome)   . LV dysfunction    iatrogenic from chemotherapy ; on Carvedilol   . Migraines   . Osteoporosis    Dr Lomax---> transferring to a new gyn    Past Surgical History:  Procedure Laterality Date  . ABDOMINAL HYSTERECTOMY     BSO for  Endometriosis  . CARPAL TUNNEL RELEASE     left  . CERVICAL DISC SURGERY    . COLONOSCOPY W/ POLYPECTOMY  1999   Dr Earlean Shawl  . ESOPHAGEAL DILATION  2005   Dr Earlean Shawl  . MASTECTOMY  2009   bilateral, Dr Margot Chimes  . SEPTOPLASTY      Social History   Tobacco Use  . Smoking status: Former Smoker    Last attempt to quit: 08/31/1990    Years since quitting: 27.2  . Smokeless tobacco: Never Used  . Tobacco comment: smoked 1971-1992, up to 1 ppd  Substance Use Topics  . Alcohol use: No    Family Hx: Family History  Problem Relation Age of Onset  . Diabetes Mother   . Breast cancer Mother   . Depression Mother        anxiety  . Heart disease Mother        in her 55s  . Colon cancer Sister   . Healthy Daughter   . Stroke Neg Hx     Medications: Current Outpatient Medications  Medication Sig Dispense Refill  . albuterol (PROAIR HFA) 108 (90 Base) MCG/ACT inhaler Inhale 2 puffs into the lungs every 4 (four) hours as needed for wheezing or shortness of breath. 18 g 5  . budesonide-formoterol (SYMBICORT) 80-4.5 MCG/ACT inhaler Inhale 2 puffs into the lungs 2 (two) times daily. 1 Inhaler 6  . cetirizine (ZYRTEC) 10 MG tablet Take 10 mg by mouth daily.    . citalopram (CELEXA) 40 MG tablet Take 1 tablet (40 mg total) by mouth daily. For mood  control 30 tablet 0  . methocarbamol (ROBAXIN) 750 MG tablet TAKE 1 TABLET (750 MG TOTAL) BY MOUTH 2 (TWO) TIMES DAILY AS NEEDED FOR MUSCLE SPASMS. 60 tablet 2  . Probiotic Product (PROBIOTIC PO) Take 1 tablet by mouth daily.    . traZODone (DESYREL) 50 MG tablet Take 1 tablet (50 mg total) by mouth at bedtime as needed for sleep. 30 tablet 0  . Calcium Carbonate-Vitamin D 600-400 MG-UNIT tablet Take 1 tablet by mouth 2 (two) times daily. 60 tablet 11  . Vitamin D, Ergocalciferol, (DRISDOL) 50000 units CAPS capsule Take 1 capsule (50,000 Units total) by mouth every 7 (seven) days. 12 capsule 10   No current facility-administered medications for this visit.     Allergies:  Allergies  Allergen Reactions  . Amoxicillin-Pot Clavulanate     diarrhea  . Doxycycline     Nausea & vomiting  . Venlafaxine     ? Reaction; ? Blurred vision     Review of Systems: General:   No F/C, wt loss Pulm:   No DIB, SOB, pleuritic chest pain Card:  No CP, palpitations Abd:  No n/v/d or pain Ext:  No inc edema from baseline  Objective:  Blood pressure 111/69, pulse 74, height 5' (1.524 m), weight 94 lb 6.4 oz (42.8 kg), SpO2 98 %. Body mass index is 18.44 kg/m. Gen:   Well NAD, A and O *3 HEENT:    [place]/AT, EOMI,  MMM Lungs:  Normal work of breathing. CTA B/L, no Wh, rhonchi Heart:   RRR, S1, S2 WNL's, no MRG Abd:   No gross distention Exts:    warm, pink,  Brisk capillary refill, warm and well perfused.  Psych:    No HI/SI, judgement and insight good, Euthymic mood. Full Affect. R arm: Painful resistance with flexion of the R wrist and painful with pronation R wrist.   Recent Results (from the past 2160 hour(s))  Comprehensive metabolic panel     Status: Abnormal   Collection Time: 09/20/17  2:32 PM  Result Value Ref Range   Sodium 139 135 - 145 mmol/L   Potassium 3.2 (L) 3.5 - 5.1 mmol/L   Chloride 107 101 - 111 mmol/L   CO2 23 22 - 32 mmol/L   Glucose, Bld 109 (H) 65 - 99 mg/dL   BUN 14  6 - 20 mg/dL   Creatinine, Ser 0.71 0.44 - 1.00 mg/dL   Calcium 9.6 8.9 - 10.3 mg/dL   Total Protein 8.0 6.5 - 8.1 g/dL   Albumin 4.2 3.5 - 5.0 g/dL   AST 25 15 - 41 U/L   ALT 16 14 - 54 U/L   Alkaline Phosphatase 59 38 - 126 U/L   Total Bilirubin 0.4 0.3 - 1.2 mg/dL   GFR calc non Af Amer >60 >60 mL/min   GFR calc Af Amer >60 >60 mL/min    Comment: (NOTE) The eGFR has been calculated using the CKD EPI equation. This calculation has not been validated in all clinical situations. eGFR's persistently <60 mL/min signify possible Chronic Kidney Disease.    Anion gap 9 5 - 15  Ethanol     Status: None   Collection Time: 09/20/17  2:32 PM  Result Value Ref Range   Alcohol, Ethyl (B) <10 <10 mg/dL    Comment:        LOWEST DETECTABLE LIMIT FOR SERUM ALCOHOL IS 10 mg/dL FOR MEDICAL PURPOSES ONLY   CBC with Diff     Status: None   Collection Time: 09/20/17  2:32 PM  Result Value Ref Range   WBC 6.9 4.0 - 10.5 K/uL   RBC 3.98 3.87 - 5.11 MIL/uL   Hemoglobin 12.3 12.0 - 15.0 g/dL   HCT 36.9 36.0 - 46.0 %   MCV 92.7 78.0 - 100.0 fL   MCH 30.9 26.0 - 34.0 pg   MCHC 33.3 30.0 - 36.0 g/dL   RDW 13.0 11.5 - 15.5 %   Platelets 302 150 - 400 K/uL   Neutrophils Relative % 72 %   Neutro Abs 4.9 1.7 - 7.7 K/uL   Lymphocytes Relative 17 %   Lymphs Abs 1.1 0.7 - 4.0 K/uL   Monocytes Relative 11 %   Monocytes Absolute 0.8 0.1 - 1.0 K/uL   Eosinophils Relative 0 %   Eosinophils Absolute 0.0 0.0 - 0.7 K/uL   Basophils Relative 0 %   Basophils Absolute 0.0 0.0 - 0.1 K/uL  Salicylate level     Status: None   Collection Time: 09/20/17  2:32 PM  Result Value Ref Range   Salicylate Lvl <8.3 2.8 - 30.0 mg/dL  Acetaminophen level     Status: Abnormal   Collection Time: 09/20/17  2:32 PM  Result Value Ref Range   Acetaminophen (Tylenol), Serum <10 (L) 10 - 30 ug/mL    Comment:        THERAPEUTIC CONCENTRATIONS VARY SIGNIFICANTLY. A RANGE OF 10-30 ug/mL MAY BE AN EFFECTIVE CONCENTRATION FOR  MANY  PATIENTS. HOWEVER, SOME ARE BEST TREATED AT CONCENTRATIONS OUTSIDE THIS RANGE. ACETAMINOPHEN CONCENTRATIONS >150 ug/mL AT 4 HOURS AFTER INGESTION AND >50 ug/mL AT 12 HOURS AFTER INGESTION ARE OFTEN ASSOCIATED WITH TOXIC REACTIONS.   Urine rapid drug screen (hosp performed)     Status: Abnormal   Collection Time: 09/20/17  2:39 PM  Result Value Ref Range   Opiates NONE DETECTED NONE DETECTED   Cocaine NONE DETECTED NONE DETECTED   Benzodiazepines NONE DETECTED NONE DETECTED   Amphetamines NONE DETECTED NONE DETECTED   Tetrahydrocannabinol POSITIVE (A) NONE DETECTED   Barbiturates NONE DETECTED NONE DETECTED    Comment: (NOTE) DRUG SCREEN FOR MEDICAL PURPOSES ONLY.  IF CONFIRMATION IS NEEDED FOR ANY PURPOSE, NOTIFY LAB WITHIN 5 DAYS. LOWEST DETECTABLE LIMITS FOR URINE DRUG SCREEN Drug Class                     Cutoff (ng/mL) Amphetamine and metabolites    1000 Barbiturate and metabolites    200 Benzodiazepine                 329 Tricyclics and metabolites     300 Opiates and metabolites        300 Cocaine and metabolites        300 THC                            50   Urinalysis, Routine w reflex microscopic     Status: Abnormal   Collection Time: 09/20/17  2:39 PM  Result Value Ref Range   Color, Urine STRAW (A) YELLOW   APPearance CLEAR CLEAR   Specific Gravity, Urine 1.006 1.005 - 1.030   pH 5.0 5.0 - 8.0   Glucose, UA 50 (A) NEGATIVE mg/dL   Hgb urine dipstick MODERATE (A) NEGATIVE   Bilirubin Urine NEGATIVE NEGATIVE   Ketones, ur NEGATIVE NEGATIVE mg/dL   Protein, ur NEGATIVE NEGATIVE mg/dL   Nitrite NEGATIVE NEGATIVE   Leukocytes, UA LARGE (A) NEGATIVE   RBC / HPF 0-5 0 - 5 RBC/hpf   WBC, UA 6-30 0 - 5 WBC/hpf   Bacteria, UA RARE (A) NONE SEEN   Squamous Epithelial / LPF 0-5 (A) NONE SEEN   Mucus PRESENT   CBG monitoring, ED     Status: Abnormal   Collection Time: 09/20/17  2:56 PM  Result Value Ref Range   Glucose-Capillary 101 (H) 65 - 99 mg/dL    Comprehensive metabolic panel     Status: Abnormal   Collection Time: 09/21/17  5:10 AM  Result Value Ref Range   Sodium 140 135 - 145 mmol/L    Comment: REPEATED TO VERIFY   Potassium 4.0 3.5 - 5.1 mmol/L    Comment: REPEATED TO VERIFY DELTA CHECK NOTED NO VISIBLE HEMOLYSIS    Chloride 114 (H) 101 - 111 mmol/L    Comment: REPEATED TO VERIFY   CO2 23 22 - 32 mmol/L    Comment: REPEATED TO VERIFY   Glucose, Bld 95 65 - 99 mg/dL   BUN 8 6 - 20 mg/dL   Creatinine, Ser 0.69 0.44 - 1.00 mg/dL   Calcium 8.5 (L) 8.9 - 10.3 mg/dL   Total Protein 6.4 (L) 6.5 - 8.1 g/dL   Albumin 3.6 3.5 - 5.0 g/dL   AST 21 15 - 41 U/L   ALT 13 (L) 14 - 54 U/L   Alkaline Phosphatase 50 38 - 126 U/L   Total  Bilirubin 0.7 0.3 - 1.2 mg/dL   GFR calc non Af Amer >60 >60 mL/min   GFR calc Af Amer >60 >60 mL/min    Comment: (NOTE) The eGFR has been calculated using the CKD EPI equation. This calculation has not been validated in all clinical situations. eGFR's persistently <60 mL/min signify possible Chronic Kidney Disease.    Anion gap 3 (L) 5 - 15    Comment: REPEATED TO VERIFY  CBC     Status: Abnormal   Collection Time: 09/21/17  5:10 AM  Result Value Ref Range   WBC 5.3 4.0 - 10.5 K/uL   RBC 3.44 (L) 3.87 - 5.11 MIL/uL   Hemoglobin 10.4 (L) 12.0 - 15.0 g/dL   HCT 31.9 (L) 36.0 - 46.0 %   MCV 92.7 78.0 - 100.0 fL   MCH 30.2 26.0 - 34.0 pg   MCHC 32.6 30.0 - 36.0 g/dL   RDW 13.2 11.5 - 15.5 %   Platelets 238 150 - 400 K/uL  Magnesium     Status: None   Collection Time: 09/21/17  5:10 AM  Result Value Ref Range   Magnesium 2.3 1.7 - 2.4 mg/dL  Basic metabolic panel     Status: Abnormal   Collection Time: 09/22/17  5:06 AM  Result Value Ref Range   Sodium 139 135 - 145 mmol/L   Potassium 4.1 3.5 - 5.1 mmol/L   Chloride 106 101 - 111 mmol/L   CO2 27 22 - 32 mmol/L   Glucose, Bld 103 (H) 65 - 99 mg/dL   BUN 9 6 - 20 mg/dL   Creatinine, Ser 0.68 0.44 - 1.00 mg/dL   Calcium 9.4 8.9 - 10.3  mg/dL   GFR calc non Af Amer >60 >60 mL/min   GFR calc Af Amer >60 >60 mL/min    Comment: (NOTE) The eGFR has been calculated using the CKD EPI equation. This calculation has not been validated in all clinical situations. eGFR's persistently <60 mL/min signify possible Chronic Kidney Disease.    Anion gap 6 5 - 15  Comprehensive metabolic panel     Status: None   Collection Time: 10/04/17 10:09 AM  Result Value Ref Range   Glucose 94 65 - 99 mg/dL   BUN 18 8 - 27 mg/dL   Creatinine, Ser 0.94 0.57 - 1.00 mg/dL   GFR calc non Af Amer 63 >59 mL/min/1.73   GFR calc Af Amer 73 >59 mL/min/1.73   BUN/Creatinine Ratio 19 12 - 28   Sodium 142 134 - 144 mmol/L   Potassium 4.6 3.5 - 5.2 mmol/L   Chloride 101 96 - 106 mmol/L   CO2 26 20 - 29 mmol/L   Calcium 9.7 8.7 - 10.3 mg/dL   Total Protein 7.2 6.0 - 8.5 g/dL   Albumin 4.4 3.6 - 4.8 g/dL   Globulin, Total 2.8 1.5 - 4.5 g/dL   Albumin/Globulin Ratio 1.6 1.2 - 2.2   Bilirubin Total 0.2 0.0 - 1.2 mg/dL   Alkaline Phosphatase 75 39 - 117 IU/L   AST 22 0 - 40 IU/L   ALT 14 0 - 32 IU/L  Hemoglobin A1c     Status: None   Collection Time: 10/04/17 10:09 AM  Result Value Ref Range   Hgb A1c MFr Bld 5.5 4.8 - 5.6 %    Comment:          Prediabetes: 5.7 - 6.4          Diabetes: >6.4  Glycemic control for adults with diabetes: <7.0    Est. average glucose Bld gHb Est-mCnc 111 mg/dL  Lipid panel     Status: Abnormal   Collection Time: 10/04/17 10:09 AM  Result Value Ref Range   Cholesterol, Total 230 (H) 100 - 199 mg/dL   Triglycerides 73 0 - 149 mg/dL   HDL 91 >39 mg/dL   VLDL Cholesterol Cal 15 5 - 40 mg/dL   LDL Calculated 124 (H) 0 - 99 mg/dL   Chol/HDL Ratio 2.5 0.0 - 4.4 ratio    Comment:                                   T. Chol/HDL Ratio                                             Men  Women                               1/2 Avg.Risk  3.4    3.3                                   Avg.Risk  5.0    4.4                                 2X Avg.Risk  9.6    7.1                                3X Avg.Risk 23.4   11.0   VITAMIN D 25 Hydroxy (Vit-D Deficiency, Fractures)     Status: Abnormal   Collection Time: 10/04/17 10:09 AM  Result Value Ref Range   Vit D, 25-Hydroxy 24.2 (L) 30.0 - 100.0 ng/mL    Comment: Vitamin D deficiency has been defined by the [place] practice guideline as a level of serum 25-OH vitamin D less than 20 ng/mL (1,2). The Endocrine Society went on to further define vitamin D insufficiency as a level between 21 and 29 ng/mL (2). 1. IOM (Institute of Medicine). 2010. Dietary reference    intakes for calcium and D. [place]: The    [employer]. 2. Holick MF, [place], Bischoff-Ferrari HA, et al.    Evaluation, treatment, and prevention of vitamin D    deficiency: an Endocrine Society clinical practice    guideline. JCEM. 2011 Jul; 96(7):1911-30.   TSH     Status: None   Collection Time: 10/04/17 10:09 AM  Result Value Ref Range   TSH 1.240 0.450 - 4.500 uIU/mL  CBC with Differential/Platelet     Status: Abnormal   Collection Time: 10/04/17 10:09 AM  Result Value Ref Range   WBC 5.7 3.4 - 10.8 x10E3/uL   RBC 3.95 3.77 - 5.28 x10E6/uL   Hemoglobin 11.7 11.1 - 15.9 g/dL   Hematocrit 36.9 34.0 - 46.6 %   MCV 93 79 - 97 fL   MCH 29.6 26.6 - 33.0 pg   MCHC 31.7 31.5 - 35.7 g/dL   RDW 13.2  12.3 - 15.4 %   Platelets 458 (H) 150 - 379 x10E3/uL   Neutrophils 61 Not Estab. %   Lymphs 28 Not Estab. %   Monocytes 9 Not Estab. %   Eos 1 Not Estab. %   Basos 1 Not Estab. %   Neutrophils Absolute 3.5 1.4 - 7.0 x10E3/uL   Lymphocytes Absolute 1.6 0.7 - 3.1 x10E3/uL   Monocytes Absolute 0.5 0.1 - 0.9 x10E3/uL   EOS (ABSOLUTE) 0.1 0.0 - 0.4 x10E3/uL   Basophils Absolute 0.0 0.0 - 0.2 x10E3/uL   Immature Granulocytes 0 Not Estab. %   Immature Grans (Abs) 0.0 0.0 - 0.1 x10E3/uL  Vitamin B12     Status: None   Collection Time: 10/04/17  10:09 AM  Result Value Ref Range   Vitamin B-12 876 232 - 1,245 pg/mL

## 2017-10-14 NOTE — Patient Instructions (Addendum)
If you have no heard from anyone in 1 week regarding your bone density exam, please call the office and talk to [facility] and ask him about what to do.   Recommend going to group or individual counseling every week.      Guidelines for a Low Cholesterol, Low Saturated Fat Diet   Fats - Limit total intake of fats and oils. - Avoid butter, stick margarine, shortening, lard, palm and coconut oils. - Limit mayonnaise, salad dressings, gravies and sauces, unless they are homemade with low-fat ingredients. - Limit chocolate. - Choose low-fat and nonfat products, such as low-fat mayonnaise, low-fat or non-hydrogenated peanut butter, low-fat or fat-free salad dressings and nonfat gravy. - Use vegetable oil, such as canola or olive oil. - Look for margarine that does not contain trans fatty acids. - Use nuts in moderate amounts. - Read ingredient labels carefully to determine both amount and type of fat present in foods. Limit saturated and trans fats! - Avoid high-fat processed and convenience foods.  Meats and Meat Alternatives - Choose fish, chicken, [place] and lean meats. - Use dried beans, peas, lentils and tofu. - Limit egg yolks to three to four per week. - If you eat red meat, limit to no more than three servings per week and choose loin or round cuts. - Avoid fatty meats, such as bacon, sausage, franks, luncheon meats and ribs. - Avoid all organ meats, including liver.  Dairy - Choose nonfat or low-fat milk, yogurt and cottage cheese. - Most cheeses are high in fat. Choose cheeses made from non-fat milk, such as mozzarella and ricotta cheese. - Choose light or fat-free cream cheese and sour cream. - Avoid cream and sauces made with cream.  Fruits and Vegetables - Eat a wide variety of fruits and vegetables. - Use lemon juice, vinegar or "mist" olive oil on vegetables. - Avoid adding sauces, fat or oil to vegetables.  Breads, Cereals and Grains - Choose whole-grain breads,  cereals, pastas and rice. - Avoid high-fat snack foods, such as granola, cookies, pies, pastries, doughnuts and croissants.  Cooking Tips - Avoid deep fried foods. - Trim visible fat off meats and remove skin from poultry before cooking. - Bake, broil, boil, poach or roast poultry, fish and lean meats. - Drain and discard fat that drains out of meat as you cook it. - Add little or no fat to foods. - Use vegetable oil sprays to grease pans for cooking or baking. - Steam vegetables. - Use herbs or no-oil marinades to flavor foods.

## 2017-11-02 ENCOUNTER — Other Ambulatory Visit: Payer: Medicare Other

## 2017-11-06 DIAGNOSIS — M810 Age-related osteoporosis without current pathological fracture: Secondary | ICD-10-CM

## 2017-11-06 DIAGNOSIS — M8580 Other specified disorders of bone density and structure, unspecified site: Secondary | ICD-10-CM | POA: Insufficient documentation

## 2017-11-10 ENCOUNTER — Ambulatory Visit: Payer: Medicare Other | Admitting: Family Medicine

## 2017-11-17 ENCOUNTER — Other Ambulatory Visit: Payer: Self-pay | Admitting: Rheumatology

## 2017-11-17 NOTE — Telephone Encounter (Signed)
Last Visit: 08/02/17 Next Visit: 02/01/17  Okay to refill per Dr. Estanislado Pandy

## 2017-12-08 NOTE — Progress Notes (Signed)
Office Visit Note  Patient: [name]             Date of Birth: [birth year]           MRN: [id number]             PCP: Mellody Dance, DO Referring: Mellody Dance, DO Visit Date: 12/09/2017 Occupation: @GUAROCC @    Subjective:  Neck pain    History of Present Illness: [name] is a 67 y.o. female with history of fibromyalgia, DDD, osteoarthritis, and osteoporosis.  Patient states that she continues to have generalized muscle tenderness and muscle tension due to fibromyalgia.  She states that she is having increased pain in the trapezius region bilaterally.  She states she has been using a heating pad and a TENS unit on a daily basis.  She states that about a week ago she used a TENS unit for a full day which made her pain significantly worse.  She states she was experiencing tingling down her bilateral arms and using the TENS unit.  She states that she has been having significant neck pain for the past 2-3 weeks.  She denies any injuries or overuse activities.  She states the pain is been keeping her up at night.  She denies any radiation of pain or radiculopathy symptoms.  She states that her range of motion is getting progressively worse.  She states that she has been trying to do stretching exercises on a daily basis.  She states that her lower back continues to cause chronic pain as well.  She states that her lateral epicondylitis of her right elbow has improved.  She performs stretching exercises and wears a brace when her pain is severe.  She denies any swelling in her joints or hand pain at this time.  Activities of Daily Living:  Patient reports morning stiffness for 2 hours.   Patient Reports nocturnal pain.  Difficulty dressing/grooming: Denies Difficulty climbing stairs: Denies Difficulty getting out of chair: Denies Difficulty using hands for taps, buttons, cutlery, and/or writing: Denies   Review of Systems  Constitutional: Positive for fatigue.    HENT: Negative for mouth sores, mouth dryness and nose dryness.   Eyes: Negative for pain, visual disturbance and dryness.  Respiratory: Negative for cough, hemoptysis, shortness of breath and difficulty breathing.   Cardiovascular: Negative for chest pain, palpitations, hypertension and swelling in legs/feet.  Gastrointestinal: Negative for blood in stool, constipation and diarrhea.  Endocrine: Negative for increased urination.  Genitourinary: Negative for difficulty urinating and painful urination.  Musculoskeletal: Positive for arthralgias, joint pain, muscle weakness, morning stiffness and muscle tenderness. Negative for joint swelling, myalgias and myalgias.  Skin: Negative for color change, pallor, rash, hair loss, nodules/bumps, skin tightness, ulcers and sensitivity to sunlight.  Allergic/Immunologic: Negative for susceptible to infections.  Neurological: Negative for dizziness, numbness and headaches.  Hematological: Positive for bruising/bleeding tendency. Negative for swollen glands.  Psychiatric/Behavioral: Positive for sleep disturbance. Negative for depressed mood. The patient is not nervous/anxious.     PMFS History:  Patient Active Problem List   Diagnosis Date Noted  . Osteopenia after menopause 11/06/2017  . Medial epicondylitis of right elbow 10/14/2017  . MDD (major depressive disorder), severe ([place]) 09/22/2017  . MDD (major depressive disorder), recurrent episode, moderate ([place])   . Prolonged Q-T interval on ECG 09/20/2017  . Overdose 09/20/2017  . Acute cystitis with hematuria   . Suicide attempt ([place])   . Family history of diabetes mellitus- mom in 67's 08/03/2017  Class: Chronic  . Family history of colon cancer- sister died 44yo 08-08-17  . Adjustment disorder with mixed anxiety < depressed mood 2017/08/08  . B12 deficiency Aug 08, 2017  . Fibromyalgia syndrome 11/23/2016  . Other insomnia 11/23/2016  . Trapezius muscle spasm 11/23/2016  . DDD  (degenerative disc disease), cervical 11/23/2016  . DDD (degenerative disc disease), lumbar 11/23/2016  . Other idiopathic scoliosis, lumbar region 11/23/2016  . Malignant neoplasm of female breast ([place]) 11/23/2016  . History of gastroesophageal reflux (GERD) 11/23/2016  . PCP NOTES >>>>>>>>>>>>>>>>>>>> 11/21/2015  . Constipation 02/15/2015  . Annual physical exam 11/20/2013  . COPD (chronic obstructive pulmonary disease) ([place]) 11/20/2013  . Unspecified adverse effect of unspecified drug, medicinal and biological substance 12/10/2011  . Vitamin D deficiency 07/06/2011  . CARDIOMYOPATHY, PRIMARY, DILATED 01/01/2009  . BREAST CANCER, HX OF 11/02/2008  . h/o HYPERLIPIDEMIA 10/18/2007  . GERD 10/18/2007  . ANEMIA, B12 DEFICIENCY 10/12/2007  . Anxiety,depression, insomnia 10/12/2007  . IBS 10/12/2007  . Primary osteoarthritis of both hands 10/12/2007  . Osteoporosis 10/12/2007  . COLONIC POLYPS, HX OF 10/12/2007  . MIGRAINES, HX OF 10/12/2007  . Fibromyalgia--Dr. Herold Harms, on Ultram 07/14/2007    Past Medical History:  Diagnosis Date  . Anemia    B12 deficient  . B12 deficiency   . Breast cancer ([place])    left breast.  . Chronic SI joint pain    Bilateral, Dr. Estanislado Pandy  . Fibromyalgia    Dr. Estanislado Pandy  . GERD (gastroesophageal reflux disease)   . Greater trochanteric bursitis of both hips    Dr. Estanislado Pandy  . Hyperlipidemia   . IBS (irritable bowel syndrome)   . LV dysfunction    iatrogenic from chemotherapy ; on Carvedilol   . Migraines   . Osteoporosis    Dr Lomax---> transferring to a new gyn    Family History  Problem Relation Age of Onset  . Diabetes Mother   . Breast cancer Mother   . Depression Mother        anxiety  . Heart disease Mother        in her 27s  . Colon cancer Sister   . Healthy Daughter   . Stroke Neg Hx    Past Surgical History:  Procedure Laterality Date  . ABDOMINAL HYSTERECTOMY     BSO for  Endometriosis  . CARPAL TUNNEL RELEASE      left  . CERVICAL DISC SURGERY    . COLONOSCOPY W/ POLYPECTOMY  1999   Dr Earlean Shawl  . ESOPHAGEAL DILATION  2005   Dr Earlean Shawl  . MASTECTOMY  2009   bilateral, Dr Margot Chimes  . SEPTOPLASTY     Social History   Social History Narrative   Lives w/ husband     Objective: Vital Signs: BP (!) 89/55 (BP Location: Right Arm, Patient Position: Sitting, Cuff Size: Normal)   Pulse 66   Resp 16   Ht 5' (1.524 m)   Wt 97 lb (44 kg)   BMI 18.94 kg/m    Physical Exam  Constitutional: She is oriented to person, place, and time. She appears well-developed and well-nourished.  HENT:  Head: Normocephalic and atraumatic.  Eyes: Conjunctivae and EOM are normal.  Neck: Normal range of motion.  Cardiovascular: Normal rate, regular rhythm, normal heart sounds and intact distal pulses.  Pulmonary/Chest: Effort normal and breath sounds normal.  Abdominal: Soft. Bowel sounds are normal.  Lymphadenopathy:    She has no cervical adenopathy.  Neurological: She is alert and oriented  to person, place, and time.  Skin: Skin is warm and dry. Capillary refill takes less than 2 seconds.  Psychiatric: She has a normal mood and affect. Her behavior is normal.  Nursing note and vitals reviewed.    Musculoskeletal Exam: C-spine limited range of motion with discomfort.  Thoracic and lumbar spine good range of motion.  No midline spinal tenderness.  No SI joint tenderness.  Shoulder joints, elbow joints, wrist joints, MCPs, PIPs, DIPs good range of motion with no synovitis.  Hip joints, knee joints, ankle joints, MTPs, PIPs, DIPs good range of motion with no synovitis.  No warmth or effusion of bilateral knees.  She is tenderness of bilateral trochanteric bursa.  CDAI Exam: No CDAI exam completed.    Investigation: No additional findings. CBC Latest Ref Rng & Units 10/04/2017 09/21/2017 09/20/2017  WBC 3.4 - 10.8 x10E3/uL 5.7 5.3 6.9  Hemoglobin 11.1 - 15.9 g/dL 11.7 10.4(L) 12.3  Hematocrit 34.0 - 46.6 % 36.9  31.9(L) 36.9  Platelets 150 - 379 x10E3/uL 458(H) 238 302   CMP Latest Ref Rng & Units 10/04/2017 09/22/2017 09/21/2017  Glucose 65 - 99 mg/dL 94 103(H) 95  BUN 8 - 27 mg/dL 18 9 8   Creatinine 0.57 - 1.00 mg/dL 0.94 0.68 0.69  Sodium 134 - 144 mmol/L 142 139 140  Potassium 3.5 - 5.2 mmol/L 4.6 4.1 4.0  Chloride 96 - 106 mmol/L 101 106 114(H)  CO2 20 - 29 mmol/L 26 27 23   Calcium 8.7 - 10.3 mg/dL 9.7 9.4 8.5(L)  Total Protein 6.0 - 8.5 g/dL 7.2 - 6.4(L)  Total Bilirubin 0.0 - 1.2 mg/dL 0.2 - 0.7  Alkaline Phos 39 - 117 IU/L 75 - 50  AST 0 - 40 IU/L 22 - 21  ALT 0 - 32 IU/L 14 - 13(L)    Imaging: No results found.  Speciality Comments: Narcotic Agreement Broken- [place] found on last UDS.     Procedures:  Trigger Point Inj Date/Time: 12/09/2017 10:04 AM Performed by: Ofilia Neas, PA-C Authorized by: Ofilia Neas, PA-C   Consent Given by:  Patient Site marked: the procedure site was marked   Timeout: prior to procedure the correct patient, procedure, and site was verified   Indications:  Pain Total # of Trigger Points:  2 Location: neck   Needle Size:  27 G Approach:  Dorsal Medications #1:  0.5 mL lidocaine 1 %; 10 mg triamcinolone acetonide 40 MG/ML Medications #2:  0.5 mL lidocaine 1 %; 10 mg triamcinolone acetonide 40 MG/ML Patient tolerance:  Patient tolerated the procedure well with no immediate complications   Allergies: Amoxicillin-pot clavulanate; Doxycycline; and Venlafaxine   Assessment / Plan:     Visit Diagnoses: Fibromyalgia -She continues to have generalized muscle tension and muscle tenderness.  She is having muscle spasms in the trapezius region.  She has been using a heating pad and TENS unit in this region with some relief.  She was encouraged to continue to exercise and stretch on a regular basis. She was  previously being prescribed Tramadol 11/24/2016 but she breeched the narcotic agreement. (marijuana was found on UDS).    DDD (degenerative disc  disease), cervical: She is limited range of motion of her C-spine.  She has discomfort with range of motion.  She has been using a TENS unit and heating pad for some pain relief.  An x-ray was obtained today which revealed C-spine fusion and multilevel spondylosis.  DDD (degenerative disc disease), lumbar: Chronic pain.  No midline  spinal tenderness.  Lateral epicondylitis of right elbow: Resolved.  She wears a brace if the pain gets severe.  She also performs exercises on a regular basis.  Trochanteric bursitis of right hip: She has no tenderness over trochanteric bursa today.  Primary osteoarthritis of both hands: No discomfort at this time.  No synovitis was noted on exam.  She has good range of motion with complete fist formation bilaterally.  Age-related osteoporosis without current pathological fracture - Followed up by her PCP  Other medical conditions are listed as follows:  History of scoliosis  Other insomnia  History of IBS  History of migraine  History of hyperlipidemia  History of vitamin D deficiency  History of anxiety  History of gastroesophageal reflux (GERD)  History of COPD  History of depression  History of breast cancer    Orders: Orders Placed This Encounter  Procedures  . Trigger Point Inj   No orders of the defined types were placed in this encounter.   Face-to-face time spent with patient was 30 minutes. >50% of time was spent in counseling and coordination of care.  Follow-Up Instructions: Return for Fibromyalgia, Osteoarthritis, Osteoporosis.   Ofilia Neas, PA-C I examined and evaluated the patient with Hazel Sams PA. The plan of care was discussed as noted above.  Bo Merino, MD Note - This record has been created using Editor, commissioning.  Chart creation errors have been sought, but may not always  have been located. Such creation errors do not reflect on  the standard of medical care.

## 2017-12-09 ENCOUNTER — Ambulatory Visit: Payer: Medicare Other | Admitting: Rheumatology

## 2017-12-09 ENCOUNTER — Encounter: Payer: Self-pay | Admitting: Rheumatology

## 2017-12-09 ENCOUNTER — Ambulatory Visit (INDEPENDENT_AMBULATORY_CARE_PROVIDER_SITE_OTHER): Payer: Self-pay

## 2017-12-09 VITALS — BP 89/55 | HR 66 | Resp 16 | Ht 60.0 in | Wt 97.0 lb

## 2017-12-09 DIAGNOSIS — Z8669 Personal history of other diseases of the nervous system and sense organs: Secondary | ICD-10-CM | POA: Diagnosis not present

## 2017-12-09 DIAGNOSIS — Z8739 Personal history of other diseases of the musculoskeletal system and connective tissue: Secondary | ICD-10-CM

## 2017-12-09 DIAGNOSIS — M5030 Other cervical disc degeneration, unspecified cervical region: Secondary | ICD-10-CM

## 2017-12-09 DIAGNOSIS — M7061 Trochanteric bursitis, right hip: Secondary | ICD-10-CM | POA: Diagnosis not present

## 2017-12-09 DIAGNOSIS — M19042 Primary osteoarthritis, left hand: Secondary | ICD-10-CM

## 2017-12-09 DIAGNOSIS — M542 Cervicalgia: Secondary | ICD-10-CM

## 2017-12-09 DIAGNOSIS — Z8659 Personal history of other mental and behavioral disorders: Secondary | ICD-10-CM | POA: Diagnosis not present

## 2017-12-09 DIAGNOSIS — M62838 Other muscle spasm: Secondary | ICD-10-CM

## 2017-12-09 DIAGNOSIS — Z8639 Personal history of other endocrine, nutritional and metabolic disease: Secondary | ICD-10-CM | POA: Diagnosis not present

## 2017-12-09 DIAGNOSIS — Z8719 Personal history of other diseases of the digestive system: Secondary | ICD-10-CM | POA: Diagnosis not present

## 2017-12-09 DIAGNOSIS — G4709 Other insomnia: Secondary | ICD-10-CM | POA: Diagnosis not present

## 2017-12-09 DIAGNOSIS — M19041 Primary osteoarthritis, right hand: Secondary | ICD-10-CM | POA: Diagnosis not present

## 2017-12-09 DIAGNOSIS — M797 Fibromyalgia: Secondary | ICD-10-CM | POA: Diagnosis not present

## 2017-12-09 DIAGNOSIS — M810 Age-related osteoporosis without current pathological fracture: Secondary | ICD-10-CM

## 2017-12-09 DIAGNOSIS — M7711 Lateral epicondylitis, right elbow: Secondary | ICD-10-CM | POA: Diagnosis not present

## 2017-12-09 DIAGNOSIS — Z853 Personal history of malignant neoplasm of breast: Secondary | ICD-10-CM

## 2017-12-09 DIAGNOSIS — M51369 Other intervertebral disc degeneration, lumbar region without mention of lumbar back pain or lower extremity pain: Secondary | ICD-10-CM

## 2017-12-09 DIAGNOSIS — M5136 Other intervertebral disc degeneration, lumbar region: Secondary | ICD-10-CM

## 2017-12-09 DIAGNOSIS — Z8709 Personal history of other diseases of the respiratory system: Secondary | ICD-10-CM

## 2017-12-09 MED ORDER — LIDOCAINE HCL 1 % IJ SOLN
0.5000 mL | INTRAMUSCULAR | Status: AC | PRN
  Administered 2017-12-09: .5 mL

## 2017-12-09 MED ORDER — TRIAMCINOLONE ACETONIDE 40 MG/ML IJ SUSP
10.0000 mg | INTRAMUSCULAR | Status: AC | PRN
  Administered 2017-12-09: 10 mg via INTRAMUSCULAR

## 2017-12-11 ENCOUNTER — Other Ambulatory Visit: Payer: Self-pay | Admitting: Internal Medicine

## 2017-12-20 ENCOUNTER — Ambulatory Visit (INDEPENDENT_AMBULATORY_CARE_PROVIDER_SITE_OTHER): Payer: Medicare Other | Admitting: Family Medicine

## 2017-12-20 ENCOUNTER — Telehealth: Payer: Self-pay | Admitting: Family Medicine

## 2017-12-20 ENCOUNTER — Encounter: Payer: Self-pay | Admitting: Family Medicine

## 2017-12-20 VITALS — BP 108/64 | HR 66 | Temp 98.7°F | Resp 18 | Ht 60.0 in | Wt 93.0 lb

## 2017-12-20 DIAGNOSIS — B370 Candidal stomatitis: Secondary | ICD-10-CM | POA: Diagnosis not present

## 2017-12-20 MED ORDER — FLUCONAZOLE 150 MG PO TABS: ORAL_TABLET | ORAL | 0 refills | Status: DC

## 2017-12-20 NOTE — Progress Notes (Signed)
Pt here for an acute care OV today   Impression and Recommendations:    1. Thrush, oral     1. Thrush, oral -start diflucan.  -start nistatin. -pt declines antifungal troches.  -wash your mouth out after using symbicort inhaler.  Meds ordered this encounter  Medications  . fluconazole (DIFLUCAN) 150 MG tablet    Sig: 1 tablet by mouth every 4 days x 4    Dispense:  4 tablet    Refill:  0     Education and routine counseling performed. Handouts provided  Gross side effects, risk and benefits, and alternatives of medications and treatment plan in general discussed with patient.  Patient is aware that all medications have potential side effects and we are unable to predict every side effect or drug-drug interaction that may occur.   Patient will call with any questions prior to using medication if they have concerns.  Expresses verbal understanding and consents to current therapy and treatment regimen.  No barriers to understanding were identified.  Red flag symptoms and signs discussed in detail.  Patient expressed understanding regarding what to do in case of emergency\urgent symptoms   Please see AVS handed out to patient at the end of our visit for further patient instructions/ counseling done pertaining to today's office visit.   Return if symptoms worsen or fail to improve, for Please follow-up for chronic care as previously discussed.     Note: This document was prepared occasionally using Dragon voice recognition software and may include unintentional dictation errors in addition to a scribe.   This document serves as a record of services personally performed by Mellody Dance, DO. It was created on her behalf by Mayer Masker, a trained medical scribe. The creation of this record is based on the scribe's personal observations and the provider's statements to them.   I have reviewed the above medical documentation for accuracy and completeness and I concur.    Mellody Dance 12/28/17 2:50 PM  --------------------------------------------------------------------------------------------------------------------------------------------------------------------------    Subjective:    CC:  Chief Complaint  Patient presents with  . Thrush    throat    HPI: [name] is a 67 y.o. female who presents to [place] [facility] today for issues as discussed below.  Mouth This began yesterday. She has white places in her mouth and on her tongue. She states things taste horrible. She has burning/stinging. She denies swelling or rashes. She has taken diflucan oral pills (2 one day then one a day later) before with good relief.   She has gotten this before. This is the second time she has had it since October 2018. She states she used an inhaler, symbicort, and closed her throat.      Wt Readings from Last 3 Encounters:  12/20/17 93 lb (42.2 kg)  12/09/17 97 lb (44 kg)  10/14/17 94 lb 6.4 oz (42.8 kg)   BP Readings from Last 3 Encounters:  12/20/17 108/64  12/09/17 (!) 89/55  10/14/17 111/69   BMI Readings from Last 3 Encounters:  12/20/17 18.16 kg/m  12/09/17 18.94 kg/m  10/14/17 18.44 kg/m     Patient Care Team    Relationship Specialty Notifications Start End  Mellody Dance, DO PCP - General Family Medicine  08/03/17   Bo Merino, MD Consulting Physician Rheumatology  10/02/14   West Pugh, NP (Inactive) Nurse Practitioner Gynecology  10/02/14   Richmond Campbell, MD Consulting Physician Gastroenterology  08/03/17   Allyson Sabal,  Albertina Parr, [place] Physician Dermatology  August 12, 2017      Patient Active Problem List   Diagnosis Date Noted  . Family history of colon cancer- sister died 2yo 12-Aug-2017    Priority: High  . Adjustment disorder with mixed anxiety < depressed mood 08-12-17    Priority: High  . Malignant neoplasm of female breast ([place]) 11/23/2016    Priority: High  . COPD  (chronic obstructive pulmonary disease) ([place]) 11/20/2013    Priority: High  . h/o HYPERLIPIDEMIA 10/18/2007    Priority: High  . History of gastroesophageal reflux (GERD) 11/23/2016    Priority: Medium  . CARDIOMYOPATHY, PRIMARY, DILATED 01/01/2009    Priority: Medium  . ANEMIA, B12 DEFICIENCY 10/12/2007    Priority: Medium  . Anxiety,depression, insomnia 10/12/2007    Priority: Medium  . IBS 10/12/2007    Priority: Medium  . Fibromyalgia--Dr. Herold Harms, on Ultram 07/14/2007    Priority: Medium  . Family history of diabetes mellitus- mom in 50's 08/12/2017    Priority: Low    Class: Chronic  . B12 deficiency 2017-08-12    Priority: Low  . Other insomnia 11/23/2016    Priority: Low  . Vitamin D deficiency 07/06/2011    Priority: Low  . Osteoporosis 10/12/2007    Priority: Low  . Osteopenia after menopause 11/06/2017  . Medial epicondylitis of right elbow 10/14/2017  . MDD (major depressive disorder), severe ([place]) 09/22/2017  . MDD (major depressive disorder), recurrent episode, moderate ([place])   . Prolonged Q-T interval on ECG 09/20/2017  . Overdose 09/20/2017  . Acute cystitis with hematuria   . Suicide attempt ([place])   . Fibromyalgia syndrome 11/23/2016  . Trapezius muscle spasm 11/23/2016  . DDD (degenerative disc disease), cervical 11/23/2016  . DDD (degenerative disc disease), lumbar 11/23/2016  . Other idiopathic scoliosis, lumbar region 11/23/2016  . PCP NOTES >>>>>>>>>>>>>>>>>>>> 11/21/2015  . Constipation 02/15/2015  . Annual physical exam 11/20/2013  . Unspecified adverse effect of unspecified drug, medicinal and biological substance 12/10/2011  . BREAST CANCER, HX OF 11/02/2008  . GERD 10/18/2007  . Primary osteoarthritis of both hands 10/12/2007  . COLONIC POLYPS, HX OF 10/12/2007  . MIGRAINES, HX OF 10/12/2007    Past Medical history, Surgical history, Family history, Social history, Allergies and Medications have been entered into the medical record,  reviewed and changed as needed.    Current Meds  Medication Sig  . albuterol (PROAIR HFA) 108 (90 Base) MCG/ACT inhaler Inhale 2 puffs into the lungs every 4 (four) hours as needed for wheezing or shortness of breath.  . Calcium Carbonate-Vitamin D 600-400 MG-UNIT tablet Take 1 tablet by mouth 2 (two) times daily.  . cetirizine (ZYRTEC) 10 MG tablet Take 10 mg by mouth daily.  . citalopram (CELEXA) 40 MG tablet Take 1 tablet (40 mg total) by mouth daily. For mood control  . methocarbamol (ROBAXIN) 750 MG tablet TAKE 1 TABLET (750 MG TOTAL) BY MOUTH 2 (TWO) TIMES DAILY AS NEEDED FOR MUSCLE SPASMS.  . Probiotic Product (PROBIOTIC PO) Take 1 tablet by mouth daily.  . Vitamin D, Ergocalciferol, (DRISDOL) 50000 units CAPS capsule Take 1 capsule (50,000 Units total) by mouth every 7 (seven) days.  . [DISCONTINUED] budesonide-formoterol (SYMBICORT) 80-4.5 MCG/ACT inhaler Inhale 2 puffs into the lungs 2 (two) times daily.    Allergies:  Allergies  Allergen Reactions  . Amoxicillin-Pot Clavulanate     diarrhea  . Doxycycline     Nausea & vomiting  . Venlafaxine     ? Reaction; ?  Blurred vision     Review of Systems: General:   Denies fever, chills, unexplained weight loss.  Optho/Auditory:   Denies visual changes, blurred vision/LOV Respiratory:   Denies wheeze, DOE more than baseline levels.  Cardiovascular:   Denies chest pain, palpitations, new onset peripheral edema  Gastrointestinal:   Denies nausea, vomiting, diarrhea, abd pain.  Genitourinary: Denies dysuria, freq/ urgency, flank pain or discharge from genitals.  Endocrine:     Denies hot or cold intolerance, polyuria, polydipsia. Musculoskeletal:   Denies unexplained myalgias, joint swelling, unexplained arthralgias, gait problems.  Skin:  Denies new onset rash, suspicious lesions Neurological:     Denies dizziness, unexplained weakness, numbness  Psychiatric/Behavioral:   Denies mood changes, suicidal or homicidal ideations,  hallucinations    Objective:   Blood pressure 108/64, pulse 66, temperature 98.7 F (37.1 C), temperature source Oral, resp. rate 18, height 5' (1.524 m), weight 93 lb (42.2 kg), SpO2 97 %. Body mass index is 18.16 kg/m. General:  Well Developed, well nourished, appropriate for stated age.  Neuro:  Alert and oriented,  extra-ocular muscles intact  HEENT:  Normocephalic, atraumatic, neck supple Skin:  no gross rash, warm, pink. Cardiac:  RRR, S1 S2 Respiratory:  ECTA B/L and A/P, Not using accessory muscles, speaking in full sentences- unlabored. Vascular:  Ext warm, no cyanosis apprec.; cap RF less 2 sec. Psych:  No HI/SI, judgement and insight good, Euthymic mood. Full Affect. Mouth- whitish exudate, bilaterally in posterior aspect of  buccal mucosa.

## 2017-12-20 NOTE — Telephone Encounter (Signed)
Patient seen and evaluated.  See note from today.

## 2017-12-20 NOTE — Telephone Encounter (Signed)
Patient is sure she has thrush from her inhaler and is requesting diflucan. She has an acute appt at 4p but is wondering if it is even needed. Please advise

## 2017-12-20 NOTE — Telephone Encounter (Signed)
Please advise.  T. Nelson, CMA 

## 2017-12-20 NOTE — Patient Instructions (Signed)
Oral Thrush, Adult Oral thrush, also called oral candidiasis, is a fungal infection that develops in the mouth and throat and on the tongue. It causes white patches to form on the mouth and tongue. Thrush is most common in older adults, but it can occur at any age. Many cases of thrush are mild, but this infection can also be serious. Thrush can be a repeated (recurrent) problem for certain people who have a weak body defense system (immune system). The weakness can be caused by chronic illnesses, or by taking medicines that limit the body's ability to fight infection. If a person has difficulty fighting infection, the fungus that causes thrush can spread through the body. This can cause life-threatening blood or organ infections. What are the causes? This condition is caused by a fungus (yeast) called Candida albicans.  This fungus is normally present in small amounts in the mouth and on other mucous membranes. It usually causes no harm.  If conditions are present that allow the fungus to grow without control, it invades surrounding tissues and becomes an infection.  Other Candida species can also lead to thrush (rare).  What increases the risk? This condition is more likely to develop in:  People with a weakened immune system.  Older adults.  People with HIV (human immunodeficiency virus).  People with diabetes.  People with dry mouth (xerostomia).  Pregnant women.  People with poor dental care, especially people who have false teeth.  People who use antibiotic medicines.  What are the signs or symptoms? Symptoms of this condition can vary from mild and moderate to severe and persistent. Symptoms may include:  A burning feeling in the mouth and throat. This can occur at the start of a thrush infection.  White patches that stick to the mouth and tongue. The tissue around the patches may be red, raw, and painful. If rubbed (during tooth brushing, for example), the patches and the  tissue of the mouth may bleed easily.  A bad taste in the mouth or difficulty tasting foods.  A cottony feeling in the mouth.  Pain during eating and swallowing.  Poor appetite.  Cracking at the corners of the mouth.  How is this diagnosed? This condition is diagnosed based on:  Physical exam. Your health care provider will look in your mouth.  Health history. Your health care provider will ask you questions about your health.  How is this treated? This condition is treated with medicines called antifungals, which prevent the growth of fungi. These medicines are either applied directly to the affected area (topical) or swallowed (oral). The treatment will depend on the severity of the condition. Mild thrush Mild cases of thrush may clear up with the use of an antifungal mouth rinse or lozenges. Treatment usually lasts about 14 days. Moderate to severe thrush  More severe thrush infections that have spread to the esophagus are treated with an oral antifungal medicine. A topical antifungal medicine may also be used.  For some severe infections, treatment may need to continue for more than 14 days.  Oral antifungal medicines are rarely used during pregnancy because they may be harmful to the unborn child. If you are pregnant, talk with your health care provider about options for treatment. Persistent or recurrent thrush For cases of thrush that do not go away or keep coming back:  Treatment may be needed twice as long as the symptoms last.  Treatment will include both oral and topical antifungal medicines.  People with a weakened immune   system can take an antifungal medicine on a continuous basis to prevent thrush infections.  It is important to treat conditions that make a person more likely to get thrush, such as diabetes or HIV. Follow these instructions at home: Medicines  Take over-the-counter and prescription medicines only as told by your health care provider.  Talk  with your health care provider about an over-the-counter medicine called gentian violet, which kills bacteria and fungi. Relieving soreness and discomfort To help reduce the discomfort of thrush:  Drink cold liquids such as water or iced tea.  Try flavored ice treats or frozen juices.  Eat foods that are easy to swallow, such as gelatin, ice cream, or custard.  Try drinking from a straw if the patches in your mouth are painful.  General instructions  Eat plain, unflavored yogurt as directed by your health care provider. Check the label to make sure the yogurt contains live cultures. This yogurt can help healthy bacteria to grow in the mouth and can stop the growth of the fungus that causes thrush.  If you wear dentures, remove the dentures before going to bed, brush them vigorously, and soak them in a cleaning solution as directed by your health care provider.  Rinse your mouth with a warm salt-water mixture several times a day. To make a salt-water mixture, completely dissolve 1/2-1 tsp of salt in 1 cup of warm water. Contact a health care provider if:  Your symptoms are getting worse or are not improving within 7 days of starting treatment.  You have symptoms of a spreading infection, such as white patches on the skin outside of the mouth. This information is not intended to replace advice given to you by your health care provider. Make sure you discuss any questions you have with your health care provider. Document Released: 05/12/2004 Document Revised: 05/11/2016 Document Reviewed: 05/11/2016 Elsevier Interactive Patient Education  2017 [employer]vier Inc.  

## 2017-12-27 ENCOUNTER — Other Ambulatory Visit: Payer: Self-pay

## 2017-12-27 ENCOUNTER — Telehealth: Payer: Self-pay | Admitting: Family Medicine

## 2017-12-27 MED ORDER — BUDESONIDE-FORMOTEROL FUMARATE 80-4.5 MCG/ACT IN AERO: 2.0000 | INHALATION_SPRAY | Freq: Two times a day (BID) | RESPIRATORY_TRACT | 5 refills | Status: DC

## 2017-12-27 NOTE — Telephone Encounter (Signed)
Medication last filled by a pervious provider sent to Dr. Opalski to review. MPulliam, CMA/RT(R)  

## 2017-12-27 NOTE — Telephone Encounter (Signed)
Patient requesting refill on Symbicort, medication was last filled by her pervious provider on 03/22/2017.  Patient was last seen 10/14/2017 for chronic care and 12/20/2017 for acute care. Request sent to Dr. Raliegh Scarlet to review.  MPulliam, CMA/RT(R)

## 2017-12-27 NOTE — Telephone Encounter (Signed)
Patient is requesting a refill of her Symbicort. If approved please send to [facility] on [address]

## 2018-02-01 ENCOUNTER — Ambulatory Visit: Payer: Medicare Other | Admitting: Rheumatology

## 2018-02-11 ENCOUNTER — Other Ambulatory Visit: Payer: Self-pay | Admitting: Rheumatology

## 2018-02-11 NOTE — Telephone Encounter (Signed)
Last Visit: 12/09/17 Next Visit: 06/15/18  Okay to refill per Dr. Estanislado Pandy

## 2018-03-02 ENCOUNTER — Telehealth: Payer: Self-pay | Admitting: Family Medicine

## 2018-03-02 NOTE — Telephone Encounter (Signed)
Please advise.  T. Nelson, CMA 

## 2018-03-02 NOTE — Telephone Encounter (Signed)
Patient called states meds have cause thrush in her mouth & she needs a refill on:  fluconazole (DIFLUCAN) 150 MG tablet [[id number]]   Order Details  Dose, Route, Frequency: As Directed   Dispense Quantity: 4 tablet Refills: 0 Fills remaining: --        Sig: 1 tablet by mouth every 4 days x 4     Please send Rx to:  Preferred Pharmacies      [id number] [address], [place]. [phone] (Phone) [phone] (Fax)   --forwarding request to medical assistant.  --Dion Body

## 2018-03-02 NOTE — Telephone Encounter (Signed)
Needs eval, no RF please unless deemed necessary at time of PE

## 2018-03-07 ENCOUNTER — Other Ambulatory Visit: Payer: Self-pay | Admitting: Internal Medicine

## 2018-03-08 NOTE — Telephone Encounter (Signed)
Called patient left message to call the office. MPulliam, CMA/RT(R)  

## 2018-03-11 ENCOUNTER — Telehealth: Payer: Self-pay | Admitting: Internal Medicine

## 2018-03-11 NOTE — Telephone Encounter (Signed)
Pt is no longer a Pt of this office- she will need to get refill from her new PCP.

## 2018-03-11 NOTE — Telephone Encounter (Signed)
Patient called back states she needs 1 more refill on her Celexa. States it was originally prescribed years ago but she has been getting refills from this office. Patient upset because she has had to call back. Please advise.

## 2018-03-11 NOTE — Telephone Encounter (Signed)
Called patient back and informed her that per Dr. Ethel Rana nurse she was no longer a patient of this office and to contact her new PCP for refills . Patient said "Thank You".

## 2018-03-14 NOTE — Telephone Encounter (Signed)
Called the patient and left message to call back. MPulliam, CMA/RT(R)

## 2018-03-15 ENCOUNTER — Other Ambulatory Visit: Payer: Self-pay

## 2018-03-15 NOTE — Telephone Encounter (Signed)
Pharmacy sent refill request for Celexa.  Medication was last filled by a previous provider.  Last office visit 12/20/17 for acute care and 10/14/17 for chronic care. Patient has a follow up on 03/17/2018. MPulliam, CMA/RT(R)

## 2018-03-16 MED ORDER — CITALOPRAM HYDROBROMIDE 40 MG PO TABS: 40.0000 mg | ORAL_TABLET | Freq: Every day | ORAL | 0 refills | Status: DC

## 2018-03-17 ENCOUNTER — Ambulatory Visit (INDEPENDENT_AMBULATORY_CARE_PROVIDER_SITE_OTHER): Payer: Medicare Other | Admitting: Family Medicine

## 2018-03-17 ENCOUNTER — Encounter: Payer: Self-pay | Admitting: Family Medicine

## 2018-03-17 VITALS — BP 103/66 | HR 75 | Ht 60.0 in | Wt 98.0 lb

## 2018-03-17 DIAGNOSIS — B370 Candidal stomatitis: Secondary | ICD-10-CM

## 2018-03-17 DIAGNOSIS — F39 Unspecified mood [affective] disorder: Secondary | ICD-10-CM

## 2018-03-17 DIAGNOSIS — F322 Major depressive disorder, single episode, severe without psychotic features: Secondary | ICD-10-CM | POA: Diagnosis not present

## 2018-03-17 DIAGNOSIS — E559 Vitamin D deficiency, unspecified: Secondary | ICD-10-CM

## 2018-03-17 DIAGNOSIS — T1491XA Suicide attempt, initial encounter: Secondary | ICD-10-CM

## 2018-03-17 MED ORDER — ALBUTEROL SULFATE HFA 108 (90 BASE) MCG/ACT IN AERS: 2.0000 | INHALATION_SPRAY | RESPIRATORY_TRACT | 5 refills | Status: DC | PRN

## 2018-03-17 MED ORDER — SPACER/AERO CHAMBER MOUTHPIECE MISC: 1 refills | Status: DC

## 2018-03-17 MED ORDER — FLUCONAZOLE 150 MG PO TABS: ORAL_TABLET | ORAL | 0 refills | Status: DC

## 2018-03-17 NOTE — Patient Instructions (Signed)
Please call your psychiatrist\[facility] about refill of your mood medications.  Since you are seeing a counselor there as well as the psychiatrist regularly, it is not absolutely your best interest to get your mood medicines from them.  Please take your vitamin D regularly as this is very important to prevent osteoporosis.  I will see you in 4 months for routine care as well as on a yearly basis for your physicals.

## 2018-03-17 NOTE — Progress Notes (Signed)
Impression and Recommendations:    1. MDD (major depressive disorder), severe ([place])   2. Mood disorder ([place])   3. Suicide attempt ([place])   4. Vitamin D deficiency   5. Thrush, oral     1. MDD: -Advised the patient to call her psychiatrist at [facility] and make an appointment for follow up with a new provider at [facility].   -Refilled the patient's Celexa on yesterday for a 30 day prescription. Patient advised to obtain this medication from her Psychiatrist at [facility] in the future.   2. Vitamin D -She is not taking her vitamin D weekly as prescribed.   -Discussed with the patient that she will need to start taking this more consistently and on her next OV, after she has been on her vitamin D prescription for 4 months, then we will recheck her vitamin D levels.   3. COPD:  -Will refill her albuterol inhaler today with a spacer included.    Follow up in 4 months for a follow up appointment.    Education and routine counseling performed. Handouts provided.  No orders of the defined types were placed in this encounter.   Meds ordered this encounter  Medications  . Spacer/Aero Chamber Mouthpiece MISC    Sig: Use with inhaler    Dispense:  1 each    Refill:  1  . albuterol (PROAIR HFA) 108 (90 Base) MCG/ACT inhaler    Sig: Inhale 2 puffs into the lungs every 4 (four) hours as needed for wheezing or shortness of breath.    Dispense:  18 g    Refill:  5  . fluconazole (DIFLUCAN) 150 MG tablet    Sig: 1 tablet by mouth every 4 days x 4    Dispense:  4 tablet    Refill:  0    Gross side effects, risk and benefits, and alternatives of medications and treatment plan in general discussed with patient.  Patient is aware that all medications have potential side effects and we are unable to predict every side effect or drug-drug interaction that may occur.   Patient will call with any questions prior to using medication if they have concerns.  Expresses verbal understanding and  consents to current therapy and treatment regimen.  No barriers to understanding were identified.  Red flag symptoms and signs discussed in detail.  Patient expressed understanding regarding what to do in case of emergency\urgent symptoms  Please see AVS handed out to patient at the end of our visit for further patient instructions/ counseling done pertaining to today's office visit.   Return for 4 months follow-up routine care.     Note: This document was prepared using Dragon voice recognition software and may include unintentional dictation errors.  This document serves as a record of services personally performed by Mellody Dance, DO. It was created on her behalf by Steva Colder, a trained medical scribe. The creation of this record is based on the scribe's personal observations and the provider's statements to them.   I have reviewed the above medical documentation for accuracy and completeness and I concur.  Mellody Dance 03/27/18 9:30 PM  --------------------------------------------------------------------------------------------------------------------------------------------------------------------------------------------------------------------------------------------    Subjective:    CC:  Chief Complaint  Patient presents with  . Follow-up    HPI: [name] is a 67 y.o. female who presents to [place] at [facility] today for follow-up of mood.   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 She is doing well overall.   Medication Refills:  She also needs a refill of her albuterol inhaler and she notes that her symptoms haven't increased. She uses her symbicort daily and uses her albuterol up to 1-2 puffs per week.   She notes that her triggers are carrying heavy items (groceries) into and out of her home.    Depression screen [facility] 2/9 03/17/2018 12/20/2017 10/14/2017  Decreased Interest 1  1 2   Down, Depressed, Hopeless 1 1 2   PHQ - 2 Score 2 2 4   Altered sleeping 1 1 2   Tired, decreased energy 1 1 3   Change in appetite 1 1 2   Feeling bad or failure about yourself  1 1 3   Trouble concentrating 1 1 2   Moving slowly or fidgety/restless 0 0 1  Suicidal thoughts 0 0 1  PHQ-9 Score 7 7 18   Difficult doing work/chores - - Very difficult     No flowsheet data found.   Wt Readings from Last 3 Encounters:  03/17/18 98 lb (44.5 kg)  12/20/17 93 lb (42.2 kg)  12/09/17 97 lb (44 kg)   BP Readings from Last 3 Encounters:  03/17/18 103/66  12/20/17 108/64  12/09/17 (!) 89/55   Pulse Readings from Last 3 Encounters:  03/17/18 75  12/20/17 66  12/09/17 66   BMI Readings from Last 3 Encounters:  03/17/18 19.14 kg/m  12/20/17 18.16 kg/m  12/09/17 18.94 kg/m         Patient Care Team    Relationship Specialty Notifications Start End  Mellody Dance, DO PCP - General Family Medicine  August 05, 2017   Bo Merino, MD Consulting Physician Rheumatology  10/02/14   West Pugh, NP (Inactive) Nurse Practitioner Gynecology  10/02/14   Richmond Campbell, MD Consulting Physician Gastroenterology  05-Aug-2017   Druscilla Brownie, MD Consulting Physician Dermatology  08/05/2017      Patient Active Problem List   Diagnosis Date Noted  . Family history of colon cancer- sister died 73yo 08-05-2017    Priority: High  . Adjustment disorder with mixed anxiety < depressed mood 2017/08/05    Priority: High  . Malignant neoplasm of female breast ([place]) 11/23/2016    Priority: High  . COPD (chronic obstructive pulmonary disease) ([place]) 11/20/2013    Priority: High  . h/o HYPERLIPIDEMIA 10/18/2007    Priority: High  . History of gastroesophageal reflux (GERD) 11/23/2016    Priority: Medium  . CARDIOMYOPATHY, PRIMARY, DILATED 01/01/2009    Priority: Medium  . ANEMIA, B12 DEFICIENCY 10/12/2007    Priority: Medium  . Anxiety,depression, insomnia 10/12/2007    Priority: Medium    . IBS 10/12/2007    Priority: Medium  . Fibromyalgia--Dr. Herold Harms, on Ultram 07/14/2007    Priority: Medium  . Family history of diabetes mellitus- mom in 50's 08/05/2017    Priority: Low    Class: Chronic  . B12 deficiency 08/05/17    Priority: Low  . Other insomnia 11/23/2016    Priority: Low  . Vitamin D deficiency 07/06/2011    Priority: Low  . Osteoporosis 10/12/2007    Priority: Low  . Mood disorder ([place]) 03/17/2018  . Osteopenia after menopause 11/06/2017  . Medial epicondylitis of right elbow 10/14/2017  . MDD (major depressive disorder), severe ([place]) 09/22/2017  . MDD (major depressive disorder), recurrent episode, moderate ([place])   . Prolonged Q-T interval on ECG 09/20/2017  . Overdose 09/20/2017  . Acute cystitis with hematuria   . Suicide  attempt ([place])   . Fibromyalgia syndrome 11/23/2016  . Trapezius muscle spasm 11/23/2016  . DDD (degenerative disc disease), cervical 11/23/2016  . DDD (degenerative disc disease), lumbar 11/23/2016  . Other idiopathic scoliosis, lumbar region 11/23/2016  . PCP NOTES >>>>>>>>>>>>>>>>>>>> 11/21/2015  . Constipation 02/15/2015  . Annual physical exam 11/20/2013  . Unspecified adverse effect of unspecified drug, medicinal and biological substance 12/10/2011  . BREAST CANCER, HX OF 11/02/2008  . GERD 10/18/2007  . Primary osteoarthritis of both hands 10/12/2007  . COLONIC POLYPS, HX OF 10/12/2007  . MIGRAINES, HX OF 10/12/2007    Past Medical history, Surgical history, Family history, Social history, Allergies and Medications have been entered into the medical record, reviewed and changed as needed.    Current Meds  Medication Sig  . albuterol (PROAIR HFA) 108 (90 Base) MCG/ACT inhaler Inhale 2 puffs into the lungs every 4 (four) hours as needed for wheezing or shortness of breath.  . budesonide-formoterol (SYMBICORT) 80-4.5 MCG/ACT inhaler Inhale 2 puffs into the lungs 2 (two) times daily.  . Calcium Carbonate-Vitamin D  600-400 MG-UNIT tablet Take 1 tablet by mouth 2 (two) times daily.  . cetirizine (ZYRTEC) 10 MG tablet Take 10 mg by mouth daily.  . citalopram (CELEXA) 40 MG tablet Take 1 tablet (40 mg total) by mouth daily. For mood control  . methocarbamol (ROBAXIN) 750 MG tablet TAKE 1 TABLET BY MOUTH 2 TIMES DAILY AS NEEDED FOR MUSCLE SPASMS.  . Probiotic Product (PROBIOTIC PO) Take 1 tablet by mouth daily.  . Vitamin D, Ergocalciferol, (DRISDOL) 50000 units CAPS capsule Take 1 capsule (50,000 Units total) by mouth every 7 (seven) days.  . [DISCONTINUED] albuterol (PROAIR HFA) 108 (90 Base) MCG/ACT inhaler Inhale 2 puffs into the lungs every 4 (four) hours as needed for wheezing or shortness of breath.    Allergies:  Allergies  Allergen Reactions  . Amoxicillin-Pot Clavulanate     diarrhea  . Doxycycline     Nausea & vomiting  . Venlafaxine     ? Reaction; ? Blurred vision     Review of Systems: Review of Systems: General:   No F/C, wt loss Pulm:   No DIB, SOB, pleuritic chest pain Card:  No CP, palpitations Abd:  No n/v/d or pain Ext:  No inc edema from baseline Psych: no SI/ HI    Objective:   Blood pressure 103/66, pulse 75, height 5' (1.524 m), weight 98 lb (44.5 kg), SpO2 97 %. Body mass index is 19.14 kg/m. General:  Well Developed, well nourished, appropriate for stated age.  Neuro:  Alert and oriented,  extra-ocular muscles intact  HEENT:  Normocephalic, atraumatic, neck supple, no carotid bruits appreciated  Skin:  no gross rash, warm, pink. Cardiac:  RRR, S1 S2 Respiratory:  ECTA B/L and A/P, Not using accessory muscles, speaking in full sentences- unlabored. Vascular:  Ext warm, no cyanosis apprec.; cap RF less 2 sec. Psych:  No HI/SI, judgement and insight good, Euthymic mood. Full Affect.

## 2018-03-31 ENCOUNTER — Ambulatory Visit: Payer: Medicare Other | Admitting: Rheumatology

## 2018-03-31 ENCOUNTER — Encounter: Payer: Self-pay | Admitting: Rheumatology

## 2018-03-31 VITALS — BP 90/52 | HR 71 | Resp 14 | Ht 60.0 in | Wt 96.0 lb

## 2018-03-31 DIAGNOSIS — Z8639 Personal history of other endocrine, nutritional and metabolic disease: Secondary | ICD-10-CM

## 2018-03-31 DIAGNOSIS — M62838 Other muscle spasm: Secondary | ICD-10-CM

## 2018-03-31 DIAGNOSIS — M7711 Lateral epicondylitis, right elbow: Secondary | ICD-10-CM | POA: Diagnosis not present

## 2018-03-31 DIAGNOSIS — M19041 Primary osteoarthritis, right hand: Secondary | ICD-10-CM

## 2018-03-31 DIAGNOSIS — M19042 Primary osteoarthritis, left hand: Secondary | ICD-10-CM

## 2018-03-31 DIAGNOSIS — Z8739 Personal history of other diseases of the musculoskeletal system and connective tissue: Secondary | ICD-10-CM

## 2018-03-31 DIAGNOSIS — Z8669 Personal history of other diseases of the nervous system and sense organs: Secondary | ICD-10-CM

## 2018-03-31 DIAGNOSIS — M5030 Other cervical disc degeneration, unspecified cervical region: Secondary | ICD-10-CM

## 2018-03-31 DIAGNOSIS — G4709 Other insomnia: Secondary | ICD-10-CM

## 2018-03-31 DIAGNOSIS — Z8709 Personal history of other diseases of the respiratory system: Secondary | ICD-10-CM

## 2018-03-31 DIAGNOSIS — Z8719 Personal history of other diseases of the digestive system: Secondary | ICD-10-CM

## 2018-03-31 DIAGNOSIS — M51369 Other intervertebral disc degeneration, lumbar region without mention of lumbar back pain or lower extremity pain: Secondary | ICD-10-CM

## 2018-03-31 DIAGNOSIS — M7061 Trochanteric bursitis, right hip: Secondary | ICD-10-CM

## 2018-03-31 DIAGNOSIS — M797 Fibromyalgia: Secondary | ICD-10-CM

## 2018-03-31 DIAGNOSIS — Z853 Personal history of malignant neoplasm of breast: Secondary | ICD-10-CM

## 2018-03-31 DIAGNOSIS — M5136 Other intervertebral disc degeneration, lumbar region: Secondary | ICD-10-CM

## 2018-03-31 DIAGNOSIS — Z8659 Personal history of other mental and behavioral disorders: Secondary | ICD-10-CM

## 2018-03-31 DIAGNOSIS — M810 Age-related osteoporosis without current pathological fracture: Secondary | ICD-10-CM

## 2018-03-31 MED ORDER — TRIAMCINOLONE ACETONIDE 40 MG/ML IJ SUSP
10.0000 mg | INTRAMUSCULAR | Status: AC | PRN
  Administered 2018-03-31: 10 mg via INTRAMUSCULAR

## 2018-03-31 MED ORDER — LIDOCAINE HCL 1 % IJ SOLN
0.5000 mL | INTRAMUSCULAR | Status: AC | PRN
  Administered 2018-03-31: .5 mL

## 2018-03-31 NOTE — Progress Notes (Signed)
Office Visit Note  Patient: [name]             Date of Birth: February 22, 1951           MRN: [id number]             PCP: Mellody Dance, DO Referring: Mellody Dance, DO Visit Date: 03/31/2018 Occupation: @GUAROCC @  Subjective:  Neck pain    History of Present Illness: [name] is a 67 y.o. female with history of fibromyalgia, osteoarthritis, DDD, and osteoporosis.  Patient states she has been having a fibromyalgia flare over the past several days to the recent thunderstorms.  She has generalized muscle aches and muscle tenderness.  She has tension and muscle spasms in the trapezius muscles bilaterally.  She would like trigger point injections today.  She continues to take Robaxin for muscle spasms.  She states that fatigue has been stable.  She continues to have insomnia.  She wakes up several times at night with pain.  She continues to have chronic neck and lower back pain.  She has neck stiffness and lower back stiffness.  She states she continues to have chronic right trochanteric bursitis.  She performs stretching exercises on a regular basis.  She denies any joint pain or joint swelling at this time.     Activities of Daily Living:  Patient reports morning stiffness for 1 hour.   Patient Reports nocturnal pain.  Difficulty dressing/grooming: Denies Difficulty climbing stairs: Denies Difficulty getting out of chair: Denies Difficulty using hands for taps, buttons, cutlery, and/or writing: Reports  Review of Systems  Constitutional: Positive for fatigue.  HENT: Negative for mouth sores, mouth dryness and nose dryness.   Eyes: Negative for pain, visual disturbance and dryness.  Respiratory: Negative for cough, hemoptysis, shortness of breath and difficulty breathing.   Cardiovascular: Negative for chest pain, palpitations, hypertension and swelling in legs/feet.  Gastrointestinal: Negative for blood in stool, constipation and diarrhea.  Endocrine: Negative for  increased urination.  Genitourinary: Negative for painful urination.  Musculoskeletal: Positive for myalgias, muscle weakness, morning stiffness, muscle tenderness and myalgias. Negative for arthralgias, joint pain and joint swelling.  Skin: Negative for color change, pallor, rash, hair loss, nodules/bumps, skin tightness, ulcers and sensitivity to sunlight.  Allergic/Immunologic: Negative for susceptible to infections.  Neurological: Positive for headaches. Negative for dizziness, numbness and weakness.  Hematological: Negative for swollen glands.  Psychiatric/Behavioral: Positive for sleep disturbance. Negative for depressed mood. The patient is not nervous/anxious.     PMFS History:  Patient Active Problem List   Diagnosis Date Noted  . Mood disorder ([place]) 03/17/2018  . Osteopenia after menopause 11/06/2017  . Medial epicondylitis of right elbow 10/14/2017  . MDD (major depressive disorder), severe ([place]) 09/22/2017  . MDD (major depressive disorder), recurrent episode, moderate ([place])   . Prolonged Q-T interval on ECG 09/20/2017  . Overdose 09/20/2017  . Acute cystitis with hematuria   . Suicide attempt ([place])   . Family history of diabetes mellitus- mom in 50's 08-09-17    Class: Chronic  . Family history of colon cancer- sister died 46yo August 09, 2017  . Adjustment disorder with mixed anxiety < depressed mood 08-09-2017  . B12 deficiency 2017/08/09  . Fibromyalgia syndrome 11/23/2016  . Other insomnia 11/23/2016  . Trapezius muscle spasm 11/23/2016  . DDD (degenerative disc disease), cervical 11/23/2016  . DDD (degenerative disc disease), lumbar 11/23/2016  . Other idiopathic scoliosis, lumbar region 11/23/2016  . Malignant neoplasm of female breast ([place]) 11/23/2016  .  History of gastroesophageal reflux (GERD) 11/23/2016  . PCP NOTES >>>>>>>>>>>>>>>>>>>> 11/21/2015  . Constipation 02/15/2015  . Annual physical exam 11/20/2013  . COPD (chronic obstructive pulmonary disease)  ([place]) 11/20/2013  . Unspecified adverse effect of unspecified drug, medicinal and biological substance 12/10/2011  . Vitamin D deficiency 07/06/2011  . CARDIOMYOPATHY, PRIMARY, DILATED 01/01/2009  . BREAST CANCER, HX OF 11/02/2008  . h/o HYPERLIPIDEMIA 10/18/2007  . GERD 10/18/2007  . ANEMIA, B12 DEFICIENCY 10/12/2007  . Anxiety,depression, insomnia 10/12/2007  . IBS 10/12/2007  . Primary osteoarthritis of both hands 10/12/2007  . Osteoporosis 10/12/2007  . COLONIC POLYPS, HX OF 10/12/2007  . MIGRAINES, HX OF 10/12/2007  . Fibromyalgia--Dr. Herold Harms, on Ultram 07/14/2007    Past Medical History:  Diagnosis Date  . Anemia    B12 deficient  . B12 deficiency   . Breast cancer ([place])    left breast.  . Chronic SI joint pain    Bilateral, Dr. Estanislado Pandy  . Fibromyalgia    Dr. Estanislado Pandy  . GERD (gastroesophageal reflux disease)   . Greater trochanteric bursitis of both hips    Dr. Estanislado Pandy  . Hyperlipidemia   . IBS (irritable bowel syndrome)   . LV dysfunction    iatrogenic from chemotherapy ; on Carvedilol   . Migraines   . Osteoporosis    Dr Lomax---> transferring to a new gyn    Family History  Problem Relation Age of Onset  . Diabetes Mother   . Breast cancer Mother   . Depression Mother        anxiety  . Heart disease Mother        in her 25s  . Colon cancer Sister   . Healthy Daughter   . Stroke Neg Hx    Past Surgical History:  Procedure Laterality Date  . ABDOMINAL HYSTERECTOMY     BSO for  Endometriosis  . CARPAL TUNNEL RELEASE     left  . CERVICAL DISC SURGERY    . COLONOSCOPY W/ POLYPECTOMY  1999   Dr Earlean Shawl  . ESOPHAGEAL DILATION  2005   Dr Earlean Shawl  . MASTECTOMY  2009   bilateral, Dr Margot Chimes  . SEPTOPLASTY     Social History   Social History Narrative   Lives w/ husband    Objective: Vital Signs: BP (!) 90/52 (BP Location: Right Arm, Patient Position: Sitting, Cuff Size: Normal)   Pulse 71   Resp 14   Ht 5' (1.524 m)   Wt 96 lb (43.5  kg)   BMI 18.75 kg/m    Physical Exam  Constitutional: She is oriented to person, place, and time. She appears well-developed and well-nourished.  HENT:  Head: Normocephalic and atraumatic.  Eyes: Conjunctivae and EOM are normal.  Neck: Normal range of motion.  Cardiovascular: Normal rate, regular rhythm, normal heart sounds and intact distal pulses.  Pulmonary/Chest: Effort normal and breath sounds normal.  Abdominal: Soft. Bowel sounds are normal.  Lymphadenopathy:    She has no cervical adenopathy.  Neurological: She is alert and oriented to person, place, and time.  Skin: Skin is warm and dry. Capillary refill takes less than 2 seconds.  Psychiatric: She has a normal mood and affect. Her behavior is normal.  Nursing note and vitals reviewed.    Musculoskeletal Exam: C-spine limited ROM with no radiculopathy.  Thoracic and lumbar spine good ROM.  No midline spinal tenderness.  No SI joint tenderness. Tenderness and tension in bilateral trapezius muscles.  Shoulder joints, elbow joints, wrist joints, MCPs,  PIPs, and DIPs good ROM with no synovitis.  Knee joints, ankle joints, MTPs, PIPs, and DIPs good ROM with no synovitis.  No warmth or effusion of knee joints.  Tenderness of right trochanteric bursa on exam.  She has full hip ROM with discomfort bilaterally.     CDAI Exam: No CDAI exam completed.   Investigation: No additional findings.  Imaging: No results found.  Recent Labs: Lab Results  Component Value Date   WBC 5.7 10/04/2017   HGB 11.7 10/04/2017   PLT 458 (H) 10/04/2017   NA 142 10/04/2017   K 4.6 10/04/2017   CL 101 10/04/2017   CO2 26 10/04/2017   GLUCOSE 94 10/04/2017   BUN 18 10/04/2017   CREATININE 0.94 10/04/2017   BILITOT 0.2 10/04/2017   ALKPHOS 75 10/04/2017   AST 22 10/04/2017   ALT 14 10/04/2017   PROT 7.2 10/04/2017   ALBUMIN 4.4 10/04/2017   CALCIUM 9.7 10/04/2017   GFRAA 73 10/04/2017    Speciality Comments: Narcotic Agreement Broken-  [place] found on last UDS.   Procedures:  Trigger Point Inj Date/Time: 03/31/2018 11:37 AM Performed by: Ofilia Neas, PA-C Authorized by: Ofilia Neas, PA-C   Consent Given by:  Patient Site marked: the procedure site was marked   Timeout: prior to procedure the correct patient, procedure, and site was verified   Indications:  Pain Total # of Trigger Points:  2 Location: neck   Needle Size:  27 G Approach:  Dorsal Medications #1:  0.5 mL lidocaine 1 %; 10 mg triamcinolone acetonide 40 MG/ML Medications #2:  0.5 mL lidocaine 1 %; 10 mg triamcinolone acetonide 40 MG/ML Patient tolerance:  Patient tolerated the procedure well with no immediate complications   Allergies: Amoxicillin-pot clavulanate; Doxycycline; and Venlafaxine   Assessment / Plan:     Visit Diagnoses: Fibromyalgia: She has been having a fibromyalgia flare for the past 3 days.  She has been having increased generalized muscle aches and tenderness. She takes Robaxin PRN for muscle spasms.  She does not need any refills at this time. She has tension and muscle spasms in the trapezius muscles bilaterally.  She requested trapezius trigger point injections today.  She tolerated the procedure.  She was encouraged to stay active and perform stretching exercises on a regular basis.  She continues to have right trochanteric bursitis.  She performs exercises daily.  She continues to have chronic fatigue and insomnia.      Trapezius muscle spasm: She is having tension and tenderness in bilateral trapezius muscles.  She requested trigger point injections today.  She tolerated the procedure well.  Potential side effects were discussed.    DDD (degenerative disc disease), cervical: She has limited ROM with discomfort.  She has no symptoms of radiculopathy.   DDD (degenerative disc disease), lumbar: Chronic pain. No midline spinal tenderness.    Lateral epicondylitis of right elbow: She continues to have tenderness on exam.     Trochanteric bursitis of right hip: She has tenderness of right trochanteric bursa on exam.  She performs stretching exercises on a regular basis.   Primary osteoarthritis of both hands: She has no discomfort at this time.  She has complete fist formation bilaterally.  Joint protection and muscle strengthening were discussed.    Age-related osteoporosis without current pathological fracture: She is taking calcium and vitamin D supplements.   History of scoliosis  Other insomnia  Other medical conditions are listed as follows:   History of IBS  History  of migraine  History of hyperlipidemia  History of vitamin D deficiency  History of anxiety  History of gastroesophageal reflux (GERD)  History of COPD  History of depression  History of breast cancer   Orders: Orders Placed This Encounter  Procedures  . Trigger Point Inj   No orders of the defined types were placed in this encounter.   Face-to-face time spent with patient was 30 minutes. Greater than 50% of time was spent in counseling and coordination of care.  Follow-Up Instructions: Return in about 6 months (around 10/01/2018) for Fibromyalgia, Osteoarthritis, DDD.   Ofilia Neas, PA-C   I examined and evaluated the patient with Hazel Sams PA.  Patient had bilateral trapezius spasm on our exam.  Bilateral trapezius area were injected with cortisone as described above.  The plan of care was discussed as noted above.  Bo Merino, MD  Note - This record has been created using Editor, commissioning.  Chart creation errors have been sought, but may not always  have been located. Such creation errors do not reflect on  the standard of medical care.

## 2018-04-12 ENCOUNTER — Other Ambulatory Visit: Payer: Self-pay | Admitting: Family Medicine

## 2018-04-25 ENCOUNTER — Telehealth: Payer: Self-pay | Admitting: Family Medicine

## 2018-04-25 NOTE — Telephone Encounter (Signed)
According to my last office visit note she did not meet criteria for higher dose pf symbicort.    I recommend she get these commonly filled meds at another pharmacy or asked them to have it shipped from another pharmacy to her current one.

## 2018-04-25 NOTE — Telephone Encounter (Signed)
Called patient and left message for patient to call the office. MPulliam, CMA/RT(R)  

## 2018-04-25 NOTE — Telephone Encounter (Signed)
Patient called states both of her pharmacies are out of the Rx: -----  (In this Particular dosage, but they do have it in a Higher dosage) budesonide-formoterol (SYMBICORT) 80-4.5 MCG/ACT inhaler [[id number]]   Order Details  Dose: 2 puff Route: Inhalation Frequency: 2 times daily  Indications of Use: Asthma, Chronic Obstructive Pulmonary Disease  Dispense Quantity: 1 Inhaler Refills: 5 Fills remaining: --        Sig: Inhale 2 puffs into the lungs 2 (two) times daily.     - Pt states provider has discussed increasing dosage to a higher level (pt is agreeable to increase  & wishes provider to call in " New Rx for the higher dosage to pharmacy.)  --Pt uses:  Preferred Pharmacies      [id number] [address], [place]. [phone] (Phone) [phone] (Fax)   --forwarding request to medical assistant.  --Dion Body

## 2018-04-25 NOTE — Telephone Encounter (Signed)
Patient notified and states that pharmacy is suppose to get medication in today or tomorrow.  Patient will check and if needed she will call back. MPulliam, CMA/RT(R)

## 2018-04-25 NOTE — Telephone Encounter (Signed)
Patient was last seen 03/17/18.  Please advise. MPulliam, CMA/RT(R)

## 2018-05-16 ENCOUNTER — Other Ambulatory Visit: Payer: Self-pay | Admitting: Rheumatology

## 2018-05-16 NOTE — Telephone Encounter (Signed)
Last visit: 03/31/18 Next Visit: 10/04/17  Okay to refill per Dr. Estanislado Pandy

## 2018-06-15 ENCOUNTER — Ambulatory Visit: Payer: Medicare Other | Admitting: Rheumatology

## 2018-06-21 ENCOUNTER — Ambulatory Visit: Payer: Medicare Other | Admitting: Rheumatology

## 2018-07-18 ENCOUNTER — Ambulatory Visit (INDEPENDENT_AMBULATORY_CARE_PROVIDER_SITE_OTHER): Payer: Medicare Other | Admitting: Family Medicine

## 2018-07-18 ENCOUNTER — Other Ambulatory Visit: Payer: Self-pay | Admitting: Family Medicine

## 2018-07-18 ENCOUNTER — Encounter: Payer: Self-pay | Admitting: Family Medicine

## 2018-07-18 VITALS — BP 110/69 | HR 65 | Temp 98.8°F | Ht 60.0 in | Wt 92.2 lb

## 2018-07-18 DIAGNOSIS — C50919 Malignant neoplasm of unspecified site of unspecified female breast: Secondary | ICD-10-CM | POA: Diagnosis not present

## 2018-07-18 DIAGNOSIS — Z23 Encounter for immunization: Secondary | ICD-10-CM | POA: Diagnosis not present

## 2018-07-18 DIAGNOSIS — Z860100 Personal history of colon polyps, unspecified: Secondary | ICD-10-CM

## 2018-07-18 DIAGNOSIS — T1491XA Suicide attempt, initial encounter: Secondary | ICD-10-CM

## 2018-07-18 DIAGNOSIS — F39 Unspecified mood [affective] disorder: Secondary | ICD-10-CM | POA: Diagnosis not present

## 2018-07-18 DIAGNOSIS — J449 Chronic obstructive pulmonary disease, unspecified: Secondary | ICD-10-CM

## 2018-07-18 DIAGNOSIS — M8580 Other specified disorders of bone density and structure, unspecified site: Secondary | ICD-10-CM

## 2018-07-18 DIAGNOSIS — Z1211 Encounter for screening for malignant neoplasm of colon: Secondary | ICD-10-CM

## 2018-07-18 DIAGNOSIS — Z800 Family history of malignant neoplasm of digestive organs: Secondary | ICD-10-CM

## 2018-07-18 DIAGNOSIS — E559 Vitamin D deficiency, unspecified: Secondary | ICD-10-CM

## 2018-07-18 DIAGNOSIS — M810 Age-related osteoporosis without current pathological fracture: Secondary | ICD-10-CM

## 2018-07-18 DIAGNOSIS — Z79899 Other long term (current) drug therapy: Secondary | ICD-10-CM

## 2018-07-18 DIAGNOSIS — Z780 Asymptomatic menopausal state: Secondary | ICD-10-CM

## 2018-07-18 MED ORDER — BUDESONIDE-FORMOTEROL FUMARATE 80-4.5 MCG/ACT IN AERO: 2.0000 | INHALATION_SPRAY | Freq: Two times a day (BID) | RESPIRATORY_TRACT | 5 refills | Status: DC

## 2018-07-18 NOTE — Patient Instructions (Addendum)
As we discussed prior to bringing in groceries from the store a couple times a week, please use 2 puffs of the Proventil\Ventolin- rescue inhaler 15 minutes or so prior to that strenuous activity since it is always too late when you go to grab it afterward  ---> Also you are due for your medical wellness\ Medicare yearly visit.  I have never actually done a yearly physical or Medicare wellness exam on you in the past.  Please schedule this on a yearly basis.  -We are going to set you up for a bone density since you have a history of osteopenia and vitamin D deficiency and are postmenopausal. Also we are going to set you up for colonoscopy since you have a strong family history with your sister dying of colon cancer at age 8. -If you have not heard from [place] regarding both of these referrals in the next week, please call and speak with Dorothea Ogle       Shortness of Breath, Adult Shortness of breath is when a person has trouble breathing enough air, or when a person feels like she or he is having trouble breathing in enough air. Shortness of breath could be a sign of medical problem. Follow these instructions at home: Pay attention to any changes in your symptoms. Take these actions to help with your condition:  Do not smoke. Smoking is a common cause of shortness of breath. If you smoke and you need help quitting, ask your health care provider.  Avoid things that can irritate your airways, such as: ? Mold. ? Dust. ? Air pollution. ? Chemical fumes. ? Things that can cause allergy symptoms (allergens), if you have allergies.  Keep your living space clean and free of mold and dust.  Rest as needed. Slowly return to your usual activities.  Take over-the-counter and prescription medicines, including oxygen and inhaled medicines, only as told by your health care provider.  Keep all follow-up visits as told by your health care provider. This is important.  Contact a health care provider  if:  Your condition does not improve as soon as expected.  You have a hard time doing your normal activities, even after you rest.  You have new symptoms. Get help right away if:  Your shortness of breath gets worse.  You have shortness of breath when you are resting.  You feel light-headed or you faint.  You have a cough that is not controlled with medicines.  You cough up blood.  You have pain with breathing.  You have pain in your chest, arms, shoulders, or abdomen.  You have a fever.  You cannot walk up stairs or exercise the way that you normally do. This information is not intended to replace advice given to you by your health care provider. Make sure you discuss any questions you have with your health care provider. Document Released: 05/12/2001 Document Revised: 03/07/2016 Document Reviewed: 01/23/2016 Elsevier Interactive Patient Education  2018 [employer]    Chronic Obstructive Pulmonary Disease Exacerbation Chronic obstructive pulmonary disease (COPD) is a long-term (chronic) condition that affects the lungs. COPD is a general term that can be used to describe many different lung problems that cause lung swelling (inflammation) and limit airflow, including chronic bronchitis and emphysema. COPD exacerbations are episodes when breathing symptoms become much worse and require extra treatment. COPD exacerbations are usually caused by infections. Without treatment, COPD exacerbations can be severe and even life threatening. Frequent COPD exacerbations can cause further damage to the lungs.  What are the causes? This condition may be caused by:  Respiratory infections, including viral and bacterial infections.  Exposure to smoke.  Exposure to air pollution, chemical fumes, or dust.  Things that give you an allergic reaction (allergens).  Not taking your usual COPD medicines as directed.  Underlying medical problems, such as congestive heart failure or  infections not involving the lungs.  In many cases, the cause (trigger) of this condition is not known. What increases the risk? The following factors may make you more likely to develop this condition:  Smoking cigarettes.  Old age.  Frequent prior COPD exacerbations.  What are the signs or symptoms? Symptoms of this condition include:  Increased coughing.  Increased production of mucus from your lungs (sputum).  Increased wheezing.  Increased shortness of breath.  Rapid or labored breathing.  Chest tightness.  Less energy than usual.  Sleep disruption from symptoms.  Confusion or increased sleepiness.  Often these symptoms happen or get worse even with the use of medicines. How is this diagnosed? This condition is diagnosed based on:  Your medical history.  A physical exam.  You may also have tests, including:  A chest X-ray.  Blood tests.  Lung (pulmonary) function tests.  How is this treated? Treatment for this condition depends on the severity and cause of the symptoms. You may need to be admitted to a hospital for treatment. Some of the treatments commonly used to treat COPD exacerbations are:  Antibiotic medicines. These may be used for severe exacerbations caused by a lung infection, such as pneumonia.  Bronchodilators. These are inhaled medicines that expand the air passages and allow increased airflow.  Steroid medicines. These act to reduce inflammation in the airways. They may be given with an inhaler, taken by mouth, or given through an IV tube inserted into one of your veins.  Supplemental oxygen therapy.  Airway clearing techniques, such as noninvasive ventilation (NIV) and positive expiratory pressure (PEP). These provide respiratory support through a mask or other noninvasive device. An example of this would be using a continuous positive airway pressure (CPAP) machine to improve delivery of oxygen into your lungs.  Follow these  instructions at home: Medicines  Take over-the-counter and prescription medicines only as told by your health care provider. It is important to use correct technique with inhaled medicines.  If you were prescribed an antibiotic medicine or oral steroid, take it as told by your health care provider. Do not stop taking the medicine even if you start to feel better. Lifestyle  Eat a healthy diet.  Exercise regularly.  Get plenty of sleep.  Avoid exposure to all substances that irritate the airway, especially to tobacco smoke.  Wash your hands often with soap and water to reduce the risk of infection. If soap and water are not available, use hand sanitizer.  During flu season, avoid enclosed spaces that are crowded with people. General instructions  Drink enough fluid to keep your urine clear or pale yellow (unless you have a medical condition that requires fluid restriction).  Use a cool mist vaporizer. This humidifies the air and makes it easier for you to clear your chest when you cough.  If you have a home nebulizer and oxygen, continue to use them as told by your health care provider.  Keep all follow-up visits as told by your health care provider. This is important. How is this prevented?  Stay up-to-date on pneumococcal and influenza (flu) vaccines. A flu shot is recommended every year  to help prevent exacerbations.  Do not use any products that contain nicotine or tobacco, such as cigarettes and e-cigarettes. Quitting smoking is very important in preventing COPD from getting worse and in preventing exacerbations from happening as often. If you need help quitting, ask your health care provider.  Follow all instructions for pulmonary rehabilitation after a recent exacerbation. This can help prevent future exacerbations.  Work with your health care provider to develop and follow an action plan. This tells you what steps to take when you experience certain symptoms. Contact a  health care provider if:  You have a worsening of your regular COPD symptoms. Get help right away if:  You have worsening shortness of breath, even when resting.  You have trouble talking.  You have severe chest pain.  You cough up blood.  You have a fever.  You have weakness, vomit repeatedly, or faint.  You feel confused.  You are not able to sleep because of your symptoms.  You have trouble doing daily activities. Summary  COPD exacerbations are episodes when breathing symptoms become much worse and require extra treatment above your normal treatment.  Exacerbations can be severe and even life threatening. Frequent COPD exacerbations can cause further damage to your lungs.  COPD exacerbations are usually triggered by infections such as the flu, colds, and even pneumonia.  Treatment for this condition depends on the severity and cause of the symptoms. You may need to be admitted to a hospital for treatment.  Quitting smoking is very important to prevent COPD from getting worse and to prevent exacerbations from happening as often. This information is not intended to replace advice given to you by your health care provider. Make sure you discuss any questions you have with your health care provider. Document Released: 06/14/2007 Document Revised: 09/21/2016 Document Reviewed: 09/21/2016 Elsevier Interactive Patient Education  [employer].

## 2018-07-18 NOTE — Progress Notes (Signed)
Impression and Recommendations:    1. Chronic obstructive pulmonary disease, unspecified COPD type ([place])   2. Mood disorder ([place])   3. Suicide attempt ([place])   4. Malignant neoplasm of female breast, unspecified estrogen receptor status, unspecified laterality, unspecified site of breast ([place])   5. Screening for colon cancer   6. Family history of colon cancer- sister died 68yo   7. Osteopenia after menopause   8. Vitamin D deficiency   9. Flu vaccine need      Chronic obstructive pulmonary disease, unspecified COPD type ([place]) - Plan: budesonide-formoterol (SYMBICORT) 80-4.5 MCG/ACT inhaler  Screening for colon cancer - Plan: Ambulatory referral to Gastroenterology Family history of colon cancer- sister died 18yo  Osteopenia after menopause - Plan: DG Bone Density Vitamin D deficiency  Flu vaccine need - Plan: Flu vaccine HIGH DOSE PF (Fluzone High dose)   Mood:  -Patient mood is improving at this time.  -Advised the patient to continue with follow up at [facility] who is managing her Celexa at this time.    COPD: -Patients symptoms are well-controlled at this time.   -Advised the patient to keep her rescue inhaler on hand and take a couple puffs of her rescue inhaler up to 20 minutes prior to any physical exertion.   -Advised the patient if she feels that she needs the rescue inhaler, Proventil twice a week, then to inform me of this so that we can increase her dosage.   -Will provide refill of Symbicort.   Vitamin D:  Continuing on medications well.   -Will provide refill today.   Osteopenia, Bone Density Scan:  -Last Bone Density was 04/11/2013. Will place a referral to have a bone density completed in the near future.  -She had a double mastectomy in 2009.   Exercise management:  -Advised the patient to ensure that she exercises   Hx of Cardiomyopathy:  -She hasn't followed up with a cardiologist since her breast cancer diagnosis in 2009.  -At that time,  she was going due to taking the chemotherapy drug that made her heart weak.    Follow up in the near future for a CPE with fasting labs.     Education and routine counseling performed. Handouts provided.  Orders Placed This Encounter  Procedures  . DG Bone Density  . Flu vaccine HIGH DOSE PF (Fluzone High dose)  . Ambulatory referral to Gastroenterology    Medications Discontinued During This Encounter  Medication Reason  . budesonide-formoterol (SYMBICORT) 80-4.5 MCG/ACT inhaler Reorder     Meds ordered this encounter  Medications  . budesonide-formoterol (SYMBICORT) 80-4.5 MCG/ACT inhaler    Sig: Inhale 2 puffs into the lungs 2 (two) times daily.    Dispense:  1 Inhaler    Refill:  5    Gross side effects, risk and benefits, and alternatives of medications and treatment plan in general discussed with patient.  Patient is aware that all medications have potential side effects and we are unable to predict every side effect or drug-drug interaction that may occur.   Patient will call with any questions prior to using medication if they have concerns.  Expresses verbal understanding and consents to current therapy and treatment regimen.  No barriers to understanding were identified.  Red flag symptoms and signs discussed in detail.  Patient expressed understanding regarding what to do in case of emergency\urgent symptoms  Please see AVS handed out to patient at the end of our visit for further patient instructions/  counseling done pertaining to today's office visit.   Return for 4 mo Medicare Wellness-  health maintenance .     Note:  This document was prepared using Dragon voice recognition software and may include unintentional dictation errors.    This document serves as a record of services personally performed by Mellody Dance, DO. It was created on her behalf by Steva Colder, a trained medical scribe. The creation of this record is based on the scribe's personal  observations and the provider's statements to them.   I have reviewed the above medical documentation for accuracy and completeness and I concur.   Mellody Dance 07/18/18 12:28 PM  --------------------------------------------------------------------------------------------------------------------------------------------------------------------------------------------------------------------------------------------    Subjective:    CC:  Chief Complaint  Patient presents with  . Follow-up    HPI: [name] is a 67 y.o. female who presents to [place] at [facility] today for follow-up of mood.   Mood:  She has went to [facility] since her last visit to the office. She hasn't had a change in her medications, nor does the patient feel as if she needs a change in her medications either. She is not exercising at this time.   COPD: She notes that sometimes, she could use a stronger daily inhaler. She uses her rescue inhaler twice a week due to SOB after bringing groceries in and out. She denies CP, SOB, and any other symptoms.   Patient notes she has vitamin D deficiency, and has been taking her medicine weekly.  She has known history of osteoporosis and has not had bone density examination in some time now.    Noncompliance: She has not come in for yearly physical\Medicare wellness and health maintenance exams with me. -She has a history of colonic polyps but was lost to follow-up.  She also has a family history her sister dying age 7 colon cancer  -Last yearly physical was 3 of 2015 or so: Tdap--10/2011 PNA vac --never had one Shingles-- had shingles when she was in her 74's; never had vaccine Pap--01/2012--Dr Lomax--nml MMG--Bilateral Mastectomy--2009 Bone Density--05/2013-- T-1.2   Z 0.4 CCS--2009--negative per patient  With patient's history of malignant neoplasm breast with bilateral mastectomy, per patient, she does not need any additional screening  of ultrasounds etc. per her oncologist and breast specialists.      Depression screen [facility] 2/9 07/18/2018 03/17/2018 12/20/2017  Decreased Interest 1 1 1   Down, Depressed, Hopeless 1 1 1   PHQ - 2 Score 2 2 2   Altered sleeping 2 1 1   Tired, decreased energy 2 1 1   Change in appetite 1 1 1   Feeling bad or failure about yourself  1 1 1   Trouble concentrating 1 1 1   Moving slowly or fidgety/restless 1 0 0  Suicidal thoughts 0 0 0  PHQ-9 Score 10 7 7   Difficult doing work/chores Somewhat difficult - -     Wt Readings from Last 3 Encounters:  07/18/18 92 lb 3.2 oz (41.8 kg)  03/31/18 96 lb (43.5 kg)  03/17/18 98 lb (44.5 kg)   BP Readings from Last 3 Encounters:  07/18/18 110/69  03/31/18 (!) 90/52  03/17/18 103/66   Pulse Readings from Last 3 Encounters:  07/18/18 65  03/31/18 71  03/17/18 75   BMI Readings from Last 3 Encounters:  07/18/18 18.01 kg/m  03/31/18 18.75 kg/m  03/17/18 19.14 kg/m         Patient Care Team    Relationship Specialty Notifications Start End  Mellody Dance,  DO PCP - General Family Medicine  Aug 06, 2017   Bo Merino, MD Consulting Physician Rheumatology  10/02/14   West Pugh, NP (Inactive) Nurse Practitioner Gynecology  10/02/14   Richmond Campbell, MD Consulting Physician Gastroenterology  08/06/17   Druscilla Brownie, MD Consulting Physician Dermatology  2017-08-06      Patient Active Problem List   Diagnosis Date Noted  . Mood disorder ([place]) 03/17/2018    Priority: High  . Suicide attempt [facility])     Priority: High  . Family history of colon cancer- sister died 55yo 06-Aug-2017    Priority: High  . Adjustment disorder with mixed anxiety < depressed mood 08-06-2017    Priority: High  . Malignant neoplasm of female breast ([place]) 11/23/2016    Priority: High  . COPD (chronic obstructive pulmonary disease) ([place]) 11/20/2013    Priority: High  . h/o HYPERLIPIDEMIA 10/18/2007    Priority: High  . History of gastroesophageal  reflux (GERD) 11/23/2016    Priority: Medium  . CARDIOMYOPATHY, PRIMARY, DILATED 01/01/2009    Priority: Medium  . ANEMIA, B12 DEFICIENCY 10/12/2007    Priority: Medium  . Anxiety,depression, insomnia 10/12/2007    Priority: Medium  . IBS 10/12/2007    Priority: Medium  . Fibromyalgia--Dr. Herold Harms, on Ultram 07/14/2007    Priority: Medium  . Family history of diabetes mellitus- mom in 50's 08/06/17    Priority: Low    Class: Chronic  . B12 deficiency Aug 06, 2017    Priority: Low  . Other insomnia 11/23/2016    Priority: Low  . Vitamin D deficiency 07/06/2011    Priority: Low  . Osteoporosis 10/12/2007    Priority: Low  . Osteopenia after menopause 11/06/2017  . Medial epicondylitis of right elbow 10/14/2017  . MDD (major depressive disorder), severe ([place]) 09/22/2017  . MDD (major depressive disorder), recurrent episode, moderate ([place])   . Prolonged Q-T interval on ECG 09/20/2017  . Overdose 09/20/2017  . Acute cystitis with hematuria   . Fibromyalgia syndrome 11/23/2016  . Trapezius muscle spasm 11/23/2016  . DDD (degenerative disc disease), cervical 11/23/2016  . DDD (degenerative disc disease), lumbar 11/23/2016  . Other idiopathic scoliosis, lumbar region 11/23/2016  . PCP NOTES >>>>>>>>>>>>>>>>>>>> 11/21/2015  . Constipation 02/15/2015  . Annual physical exam 11/20/2013  . Unspecified adverse effect of unspecified drug, medicinal and biological substance 12/10/2011  . BREAST CANCER, HX OF 11/02/2008  . GERD 10/18/2007  . Primary osteoarthritis of both hands 10/12/2007  . COLONIC POLYPS, HX OF 10/12/2007  . MIGRAINES, HX OF 10/12/2007    Past Medical history, Surgical history, Family history, Social history, Allergies and Medications have been entered into the medical record, reviewed and changed as needed.    Current Meds  Medication Sig  . albuterol (PROAIR HFA) 108 (90 Base) MCG/ACT inhaler Inhale 2 puffs into the lungs every 4 (four) hours as needed for  wheezing or shortness of breath.  . budesonide-formoterol (SYMBICORT) 80-4.5 MCG/ACT inhaler Inhale 2 puffs into the lungs 2 (two) times daily.  . Calcium Carbonate-Vitamin D 600-400 MG-UNIT tablet Take 1 tablet by mouth 2 (two) times daily.  . cetirizine (ZYRTEC) 10 MG tablet Take 10 mg by mouth daily.  . citalopram (CELEXA) 40 MG tablet Take 1 tablet (40 mg total) by mouth daily. For mood control  . methocarbamol (ROBAXIN) 750 MG tablet TAKE 1 TABLET BY MOUTH 2 TIMES DAILY AS NEEDED FOR MUSCLE SPASMS.  . Probiotic Product (PROBIOTIC PO) Take 1 tablet by mouth daily.  Marland Kitchen Spacer/Aero-Holding Chambers (VORTEX  VALVED HOLDING CHAMBER) DEVI See admin instructions. use with inhaler  . Vitamin D, Ergocalciferol, (DRISDOL) 50000 units CAPS capsule Take 1 capsule (50,000 Units total) by mouth every 7 (seven) days.  . [DISCONTINUED] budesonide-formoterol (SYMBICORT) 80-4.5 MCG/ACT inhaler Inhale 2 puffs into the lungs 2 (two) times daily.    Allergies:  Allergies  Allergen Reactions  . Amoxicillin-Pot Clavulanate     diarrhea  . Doxycycline     Nausea & vomiting  . Venlafaxine     ? Reaction; ? Blurred vision     Review of Systems: Review of Systems: General:   No F/C, wt loss Pulm:   No DIB, SOB, pleuritic chest pain Card:  No CP, palpitations Abd:  No n/v/d or pain Ext:  No inc edema from baseline Psych: no SI/ HI    Objective:   Blood pressure 110/69, pulse 65, temperature 98.8 F (37.1 C), height 5' (1.524 m), weight 92 lb 3.2 oz (41.8 kg), SpO2 97 %. Body mass index is 18.01 kg/m. General:  Well Developed, well nourished, appropriate for stated age.  Neuro:  Alert and oriented,  extra-ocular muscles intact  HEENT:  Normocephalic, atraumatic, neck supple, no carotid bruits appreciated  Skin:  no gross rash, warm, pink. Cardiac:  RRR, S1 S2 Respiratory:  ECTA B/L and A/P, Not using accessory muscles, speaking in full sentences- unlabored. Vascular:  Ext warm, no cyanosis  apprec.; cap RF less 2 sec. Psych:  No HI/SI, judgement and insight good, Euthymic mood. Full Affect.

## 2018-08-17 ENCOUNTER — Other Ambulatory Visit: Payer: Self-pay | Admitting: Rheumatology

## 2018-08-17 NOTE — Telephone Encounter (Signed)
Last visit: 03/31/18 Next Visit: 10/04/18   Okay to refill per Dr. Estanislado Pandy

## 2018-09-13 ENCOUNTER — Encounter: Payer: Self-pay | Admitting: Family Medicine

## 2018-09-20 NOTE — Progress Notes (Deleted)
Office Visit Note  Patient: [name]             Date of Birth: April 24, 1951           MRN: [id number]             PCP: Mellody Dance, DO Referring: Mellody Dance, DO Visit Date: 10/04/2018 Occupation: @GUAROCC @  Subjective:  No chief complaint on file.   History of Present Illness: [name] is a 68 y.o. female ***   Activities of Daily Living:  Patient reports morning stiffness for *** {minute/hour:19697}.   Patient {ACTIONS;DENIES/REPORTS:21021675::"Denies"} nocturnal pain.  Difficulty dressing/grooming: {ACTIONS;DENIES/REPORTS:21021675::"Denies"} Difficulty climbing stairs: {ACTIONS;DENIES/REPORTS:21021675::"Denies"} Difficulty getting out of chair: {ACTIONS;DENIES/REPORTS:21021675::"Denies"} Difficulty using hands for taps, buttons, cutlery, and/or writing: {ACTIONS;DENIES/REPORTS:21021675::"Denies"}  No Rheumatology ROS completed.   PMFS History:  Patient Active Problem List   Diagnosis Date Noted  . Mood disorder ([place]) 03/17/2018  . Osteopenia after menopause 11/06/2017  . Medial epicondylitis of right elbow 10/14/2017  . MDD (major depressive disorder), severe ([place]) 09/22/2017  . MDD (major depressive disorder), recurrent episode, moderate ([place])   . Prolonged Q-T interval on ECG 09/20/2017  . Overdose 09/20/2017  . Acute cystitis with hematuria   . Suicide attempt ([place])   . Family history of diabetes mellitus- mom in 50's August 10, 2017    Class: Chronic  . Family history of colon cancer- sister died 41yo 2017/08/10  . Adjustment disorder with mixed anxiety < depressed mood 08/10/2017  . B12 deficiency 08/10/2017  . Fibromyalgia syndrome 11/23/2016  . Other insomnia 11/23/2016  . Trapezius muscle spasm 11/23/2016  . DDD (degenerative disc disease), cervical 11/23/2016  . DDD (degenerative disc disease), lumbar 11/23/2016  . Other idiopathic scoliosis, lumbar region 11/23/2016  . Malignant neoplasm of female breast ([place]) 11/23/2016  .  History of gastroesophageal reflux (GERD) 11/23/2016  . PCP NOTES >>>>>>>>>>>>>>>>>>>> 11/21/2015  . Constipation 02/15/2015  . Annual physical exam 11/20/2013  . COPD (chronic obstructive pulmonary disease) ([place]) 11/20/2013  . Unspecified adverse effect of unspecified drug, medicinal and biological substance 12/10/2011  . Vitamin D deficiency 07/06/2011  . CARDIOMYOPATHY, PRIMARY, DILATED 01/01/2009  . BREAST CANCER, HX OF 11/02/2008  . h/o HYPERLIPIDEMIA 10/18/2007  . GERD 10/18/2007  . ANEMIA, B12 DEFICIENCY 10/12/2007  . Anxiety,depression, insomnia 10/12/2007  . IBS 10/12/2007  . Primary osteoarthritis of both hands 10/12/2007  . Osteoporosis 10/12/2007  . COLONIC POLYPS, HX OF 10/12/2007  . MIGRAINES, HX OF 10/12/2007  . Fibromyalgia--Dr. Herold Harms, on Ultram 07/14/2007    Past Medical History:  Diagnosis Date  . Anemia    B12 deficient  . B12 deficiency   . Breast cancer ([place])    left breast.  . Chronic SI joint pain    Bilateral, Dr. Estanislado Pandy  . Fibromyalgia    Dr. Estanislado Pandy  . GERD (gastroesophageal reflux disease)   . Greater trochanteric bursitis of both hips    Dr. Estanislado Pandy  . Hyperlipidemia   . IBS (irritable bowel syndrome)   . LV dysfunction    iatrogenic from chemotherapy ; on Carvedilol   . Migraines   . Osteoporosis    Dr Lomax---> transferring to a new gyn    Family History  Problem Relation Age of Onset  . Diabetes Mother   . Breast cancer Mother   . Depression Mother        anxiety  . Heart disease Mother        in her 69s  . Colon cancer Sister   . Healthy Daughter   .  Stroke Neg Hx    Past Surgical History:  Procedure Laterality Date  . ABDOMINAL HYSTERECTOMY     BSO for  Endometriosis  . CARPAL TUNNEL RELEASE     left  . CERVICAL DISC SURGERY    . COLONOSCOPY W/ POLYPECTOMY  1999   Dr Earlean Shawl  . ESOPHAGEAL DILATION  2005   Dr Earlean Shawl  . MASTECTOMY  2009   bilateral, Dr Margot Chimes  . SEPTOPLASTY     Social History   Social  History Narrative   Lives w/ husband   Immunization History  Administered Date(s) Administered  . Influenza Whole 07/14/2007, 05/31/2012  . Influenza, High Dose Seasonal PF 07/18/2018  . Td 04/01/1998  . Tdap 10/29/2011     Objective: Vital Signs: There were no vitals taken for this visit.   Physical Exam   Musculoskeletal Exam: ***  CDAI Exam: CDAI Score: Not documented Patient Global Assessment: Not documented; Provider Global Assessment: Not documented Swollen: Not documented; Tender: Not documented Joint Exam   Not documented   There is currently no information documented on the homunculus. Go to the Rheumatology activity and complete the homunculus joint exam.  Investigation: No additional findings.  Imaging: No results found.  Recent Labs: Lab Results  Component Value Date   WBC 5.7 10/04/2017   HGB 11.7 10/04/2017   PLT 458 (H) 10/04/2017   NA 142 10/04/2017   K 4.6 10/04/2017   CL 101 10/04/2017   CO2 26 10/04/2017   GLUCOSE 94 10/04/2017   BUN 18 10/04/2017   CREATININE 0.94 10/04/2017   BILITOT 0.2 10/04/2017   ALKPHOS 75 10/04/2017   AST 22 10/04/2017   ALT 14 10/04/2017   PROT 7.2 10/04/2017   ALBUMIN 4.4 10/04/2017   CALCIUM 9.7 10/04/2017   GFRAA 73 10/04/2017    Speciality Comments: Narcotic Agreement Broken- [place] found on last UDS.   Procedures:  No procedures performed Allergies: Amoxicillin-pot clavulanate; Doxycycline; and Venlafaxine   Assessment / Plan:     Visit Diagnoses: No diagnosis found.   Orders: No orders of the defined types were placed in this encounter.  No orders of the defined types were placed in this encounter.   Face-to-face time spent with patient was *** minutes. Greater than 50% of time was spent in counseling and coordination of care.  Follow-Up Instructions: No follow-ups on file.   Earnestine Mealing, CMA  Note - This record has been created using Editor, commissioning.  Chart creation errors have  been sought, but may not always  have been located. Such creation errors do not reflect on  the standard of medical care.

## 2018-10-04 ENCOUNTER — Ambulatory Visit: Payer: Medicare Other | Admitting: Rheumatology

## 2018-11-05 ENCOUNTER — Other Ambulatory Visit: Payer: Self-pay | Admitting: Rheumatology

## 2018-11-07 NOTE — Telephone Encounter (Signed)
Last visit: 03/31/18 Next Visit due February 2020. Message sent to the front to schedule patient.   Okay to refill per Dr. Deveshwar  

## 2018-11-07 NOTE — Telephone Encounter (Signed)
Please schedule patient for a follow up visit. Patient was due February 2020. Thanks!  

## 2018-11-07 NOTE — Telephone Encounter (Signed)
LMOM for patient to call and schedule follow-up appointment.   °

## 2018-11-16 ENCOUNTER — Ambulatory Visit: Payer: Self-pay | Admitting: Family Medicine

## 2018-11-21 ENCOUNTER — Other Ambulatory Visit: Payer: Self-pay | Admitting: Family Medicine

## 2018-11-21 DIAGNOSIS — Z780 Asymptomatic menopausal state: Secondary | ICD-10-CM

## 2018-11-21 DIAGNOSIS — E559 Vitamin D deficiency, unspecified: Secondary | ICD-10-CM

## 2018-11-21 DIAGNOSIS — M810 Age-related osteoporosis without current pathological fracture: Secondary | ICD-10-CM

## 2018-12-30 ENCOUNTER — Other Ambulatory Visit: Payer: Self-pay | Admitting: Rheumatology

## 2018-12-30 NOTE — Telephone Encounter (Signed)
Please schedule patient for a follow up visit. Thanks! 

## 2018-12-30 NOTE — Telephone Encounter (Signed)
Last visit: 03/31/18 Next Visit due February 2020. Message sent to the front to schedule patient.   Okay to refill per Dr. Estanislado Pandy

## 2018-12-30 NOTE — Telephone Encounter (Signed)
I LMOM for patient to call, and schedule a follow up appt with Dr. Estanislado Pandy.

## 2019-01-04 NOTE — Telephone Encounter (Signed)
Patient states she broke the crowns on her three front teeth.  Patient will call back to schedule her follow-up after she has dentist appointment.  Patient states she hasn't scheduled the appointment with her dentist due to Coronavirus.

## 2019-02-08 NOTE — Progress Notes (Signed)
Office Visit Note  Patient: [name]             Date of Birth: [birth year]           MRN: [id number]             PCP: Mellody Dance, DO Referring: Mellody Dance, DO Visit Date: 02/22/2019 Occupation: @GUAROCC @  Subjective:  Neck pain   History of Present Illness: [name] is a 68 y.o. female with history of fibromyalgia and osteoarthritis.  She takes Robaxin 750 mg 1 tablet twice daily as needed for muscle spasms.  She is having trapezius muscle spasms and tenderness bilaterally. She has been having intermittent left hip joint pain and lower back pain. She has been having increased right hand pain but denies any joint swelling.She continues to have chronic fatigue which has been stable.  Activities of Daily Living:  Patient reports morning stiffness for 2 hours.   Patient Reports nocturnal pain.  Difficulty dressing/grooming: Denies Difficulty climbing stairs: Reports Difficulty getting out of chair: Reports Difficulty using hands for taps, buttons, cutlery, and/or writing: Reports  Review of Systems  Constitutional: Positive for fatigue.  HENT: Negative for mouth sores, mouth dryness and nose dryness.   Eyes: Negative for pain, visual disturbance and dryness.  Respiratory: Negative for cough, hemoptysis, shortness of breath and difficulty breathing.   Cardiovascular: Negative for chest pain, palpitations, hypertension and swelling in legs/feet.  Gastrointestinal: Negative for blood in stool, constipation and diarrhea.  Endocrine: Negative for increased urination.  Genitourinary: Negative for painful urination.  Musculoskeletal: Positive for arthralgias, joint pain, myalgias, morning stiffness, muscle tenderness and myalgias. Negative for joint swelling and muscle weakness.  Skin: Negative for color change, pallor, rash, hair loss, nodules/bumps, skin tightness, ulcers and sensitivity to sunlight.  Allergic/Immunologic: Negative for susceptible to  infections.  Neurological: Negative for dizziness, numbness, headaches and weakness.  Hematological: Negative for swollen glands.  Psychiatric/Behavioral: Positive for sleep disturbance. Negative for depressed mood. The patient is not nervous/anxious.     PMFS History:  Patient Active Problem List   Diagnosis Date Noted  . Mood disorder ([place]) 03/17/2018  . Osteopenia after menopause 11/06/2017  . Medial epicondylitis of right elbow 10/14/2017  . MDD (major depressive disorder), severe ([place]) 09/22/2017  . MDD (major depressive disorder), recurrent episode, moderate ([place])   . Prolonged Q-T interval on ECG 09/20/2017  . Overdose 09/20/2017  . Acute cystitis with hematuria   . Suicide attempt ([place])   . Family history of diabetes mellitus- mom in 50's 08-31-17    Class: Chronic  . Family history of colon cancer- sister died 11yo 08-31-17  . Adjustment disorder with mixed anxiety < depressed mood 08/31/17  . B12 deficiency 2017-08-31  . Fibromyalgia syndrome 11/23/2016  . Other insomnia 11/23/2016  . Trapezius muscle spasm 11/23/2016  . DDD (degenerative disc disease), cervical 11/23/2016  . DDD (degenerative disc disease), lumbar 11/23/2016  . Other idiopathic scoliosis, lumbar region 11/23/2016  . Malignant neoplasm of female breast ([place]) 11/23/2016  . History of gastroesophageal reflux (GERD) 11/23/2016  . PCP NOTES >>>>>>>>>>>>>>>>>>>> 11/21/2015  . Constipation 02/15/2015  . Annual physical exam 11/20/2013  . COPD (chronic obstructive pulmonary disease) ([place]) 11/20/2013  . Unspecified adverse effect of unspecified drug, medicinal and biological substance 12/10/2011  . Vitamin D deficiency 07/06/2011  . CARDIOMYOPATHY, PRIMARY, DILATED 01/01/2009  . BREAST CANCER, HX OF 11/02/2008  . h/o HYPERLIPIDEMIA 10/18/2007  . GERD 10/18/2007  . ANEMIA, B12 DEFICIENCY 10/12/2007  . Anxiety,depression,  insomnia 10/12/2007  . IBS 10/12/2007  . Primary osteoarthritis of both hands  10/12/2007  . Osteoporosis 10/12/2007  . COLONIC POLYPS, HX OF 10/12/2007  . MIGRAINES, HX OF 10/12/2007  . Fibromyalgia--Dr. Herold Harms, on Ultram 07/14/2007    Past Medical History:  Diagnosis Date  . Anemia    B12 deficient  . B12 deficiency   . Breast cancer ([place])    left breast.  . Chronic SI joint pain    Bilateral, Dr. Estanislado Pandy  . Fibromyalgia    Dr. Estanislado Pandy  . GERD (gastroesophageal reflux disease)   . Greater trochanteric bursitis of both hips    Dr. Estanislado Pandy  . Hyperlipidemia   . IBS (irritable bowel syndrome)   . LV dysfunction    iatrogenic from chemotherapy ; on Carvedilol   . Migraines   . Osteoporosis    Dr Lomax---> transferring to a new gyn    Family History  Problem Relation Age of Onset  . Diabetes Mother   . Breast cancer Mother   . Depression Mother        anxiety  . Heart disease Mother        in her 40s  . Colon cancer Sister   . Healthy Daughter   . Stroke Neg Hx    Past Surgical History:  Procedure Laterality Date  . ABDOMINAL HYSTERECTOMY     BSO for  Endometriosis  . CARPAL TUNNEL RELEASE     left  . CERVICAL DISC SURGERY    . COLONOSCOPY W/ POLYPECTOMY  1999   Dr Earlean Shawl  . ESOPHAGEAL DILATION  2005   Dr Earlean Shawl  . MASTECTOMY  2009   bilateral, Dr Margot Chimes  . SEPTOPLASTY     Social History   Social History Narrative   Lives w/ husband   Immunization History  Administered Date(s) Administered  . Influenza Whole 07/14/2007, 05/31/2012  . Influenza, High Dose Seasonal PF 07/18/2018  . Td 04/01/1998  . Tdap 10/29/2011     Objective: Vital Signs: BP 111/69 (BP Location: Left Arm, Patient Position: Sitting, Cuff Size: Normal)   Pulse 72   Resp 13   Ht 5' (1.524 m)   Wt 85 lb 3.2 oz (38.6 kg)   BMI 16.64 kg/m    Physical Exam Vitals signs and nursing note reviewed.  Constitutional:      Appearance: She is well-developed.  HENT:     Head: Normocephalic and atraumatic.  Eyes:     Conjunctiva/sclera: Conjunctivae  normal.  Neck:     Musculoskeletal: Normal range of motion.  Cardiovascular:     Rate and Rhythm: Normal rate and regular rhythm.     Heart sounds: Normal heart sounds.  Pulmonary:     Effort: Pulmonary effort is normal.     Breath sounds: Normal breath sounds.  Abdominal:     General: Bowel sounds are normal.     Palpations: Abdomen is soft.  Lymphadenopathy:     Cervical: No cervical adenopathy.  Skin:    General: Skin is warm and dry.     Capillary Refill: Capillary refill takes less than 2 seconds.  Neurological:     Mental Status: She is alert and oriented to person, place, and time.  Psychiatric:        Behavior: Behavior normal.      Musculoskeletal Exam: Generalized hyperalgesia and positive tender points.  She has trapezius muscle tension and muscle tenderness bilaterally.  C-spine limited range of motion.  Thoracic and lumbar spine good range of  motion.  No midline spinal tenderness.  No SI joint tenderness.  Shoulder joints, elbow joints, wrist joints, MCPs, PIPs and DIPs good range of motion with no synovitis.  She has complete fist formation bilaterally.  Right hip has good range of motion with no discomfort.  She has slightly limited range of motion of the left hip on exam.  She is tenderness over bilateral trochanter bursa.  Knee joints have good range of motion with no warmth or effusion.  No tenderness or swelling of ankle joints.  She has good range of motion of bilateral ankle joints.   CDAI Exam: CDAI Score: - Patient Global: -; Provider Global: - Swollen: -; Tender: - Joint Exam   No joint exam has been documented for this visit   There is currently no information documented on the homunculus. Go to the Rheumatology activity and complete the homunculus joint exam.  Investigation: No additional findings.  Imaging: Xr Hip Unilat W Or W/o Pelvis 2-3 Views Left  Result Date: 02/22/2019 No hip joint narrowing or SI joint narrowing was noted. Impression:  Unremarkable x-ray of the hip joint.   Recent Labs: Lab Results  Component Value Date   WBC 5.7 10/04/2017   HGB 11.7 10/04/2017   PLT 458 (H) 10/04/2017   NA 142 10/04/2017   K 4.6 10/04/2017   CL 101 10/04/2017   CO2 26 10/04/2017   GLUCOSE 94 10/04/2017   BUN 18 10/04/2017   CREATININE 0.94 10/04/2017   BILITOT 0.2 10/04/2017   ALKPHOS 75 10/04/2017   AST 22 10/04/2017   ALT 14 10/04/2017   PROT 7.2 10/04/2017   ALBUMIN 4.4 10/04/2017   CALCIUM 9.7 10/04/2017   GFRAA 73 10/04/2017    Speciality Comments: Narcotic Agreement Broken- [place] found on last UDS.   Procedures:  Trigger Point Inj  Date/Time: 02/22/2019 12:37 PM Performed by: Ofilia Neas, PA-C Authorized by: Ofilia Neas, PA-C   Consent Given by:  Patient Site marked: the procedure site was marked   Timeout: prior to procedure the correct patient, procedure, and site was verified   Indications:  Pain Total # of Trigger Points:  2 Location: neck   Needle Size:  27 G Approach:  Dorsal Medications #1:  0.5 mL lidocaine 1 %; 10 mg triamcinolone acetonide 40 MG/ML Medications #2:  0.5 mL lidocaine 1 %; 10 mg triamcinolone acetonide 40 MG/ML Patient tolerance:  Patient tolerated the procedure well with no immediate complications  Trigger Point Inj  Date/Time: 02/22/2019 1:20 PM Performed by: Bo Merino, MD Authorized by: Bo Merino, MD   Consent Given by:  Patient Site marked: the procedure site was marked   Timeout: prior to procedure the correct patient, procedure, and site was verified   Indications:  Muscle spasm and pain Total # of Trigger Points:  2 Location: neck   Needle Size:  27 G Approach:  Dorsal Medications #1:  0.5 mL lidocaine 1 %; 10 mg triamcinolone acetonide 40 MG/ML Medications #2:  0.5 mL lidocaine 1 %; 10 mg triamcinolone acetonide 40 MG/ML Patient tolerance:  Patient tolerated the procedure well with no immediate complications   Allergies:  Amoxicillin-pot clavulanate, Doxycycline, and Venlafaxine   Assessment / Plan:     Visit Diagnoses: Fibromyalgia -She has generalized hyperalgesia and positive tender points on exam.  She is experiencing trapezius muscle tension and muscle tenderness bilaterally.  She is requesting bilateral trigger point injections.  She tolerated the procedure well.  She continues to take Robaxin 750 mg  twice daily PRN for muscle spasms.  She has chronic fatigue related to insomnia.  She was encouraged to stay active and exercise on a regular basis.  She will follow up in 6 months.  Trapezius muscle spasm -She has trapezius muscle tension and muscle tenderness bilaterally.  She experiences spasms on a regular basis.  She takes Robaxin 750 mg twice daily PRN for muscle spasms.  She is requesting bilateral trigger point injections.  Bilateral trapezius area were injected as described above.  She tolerated the procedure well.  DDD (degenerative disc disease), cervical - She has very limited ROM of her C-spine.  She has no symptoms of radiculopathy at this time.   DDD (degenerative disc disease), lumbar -She has intermittent lower back pain.  She takes Robaxin 750 mg twice daily PRN for lower back muscle spasms.  She was given a handout about exercises to perform.  Lateral epicondylitis of right elbow - Resolved.   Trochanteric bursitis of right hip - She has tenderness over the right trochanteric bursa.   Primary osteoarthritis of both hands -She has mild PIP and DIP synovial thickening consistent with osteoarthritis of bilateral hands.  She has complete fist formation bilaterally.  She has no synovitis on exam.  Joint protection and muscle strengthening were discussed.  Age-related osteoporosis without current pathological fracture - She is taking a calcium and vitamin D supplement.   History of scoliosis - She has intermittent back pain.   Other insomnia - She has interrupted sleep at night due to discomfort  she experiences.  Good sleep hygiene was discussed.  Pain in left hip -she has been experiencing increased discomfort in the left hip joint.  She states that the pain is intermittent.  She has some limitation in range of motion.  X-rays of the left hip were obtained today.  Plan: XR HIP UNILAT W OR W/O PELVIS 2-3 VIEWS LEFT.  The x-ray of the hip joint was unremarkable.  A handout on hip joint exercises was given.  Other medical conditions are listed as follows:   History of IBS     Orders: Orders Placed This Encounter  Procedures  . Trigger Point Inj  . Trigger Point Inj  . XR HIP UNILAT W OR W/O PELVIS 2-3 VIEWS LEFT   No orders of the defined types were placed in this encounter.   Face-to-face time spent with patient was 30 minutes. Greater than 50% of time was spent in counseling and coordination of care.  Follow-Up Instructions: Return in about 6 months (around 08/24/2019) for Fibromyalgia, Osteoarthritis.   Bo Merino, MD  Note - This record has been created using Editor, commissioning.  Chart creation errors have been sought, but may not always  have been located. Such creation errors do not reflect on  the standard of medical care.

## 2019-02-16 ENCOUNTER — Other Ambulatory Visit: Payer: Self-pay | Admitting: Family Medicine

## 2019-02-16 ENCOUNTER — Other Ambulatory Visit: Payer: Self-pay | Admitting: Rheumatology

## 2019-02-16 DIAGNOSIS — J449 Chronic obstructive pulmonary disease, unspecified: Secondary | ICD-10-CM

## 2019-02-16 NOTE — Telephone Encounter (Signed)
Last visit: 03/31/18 Next Visit: 02/22/19  Okay to refill per Dr. Estanislado Pandy

## 2019-02-22 ENCOUNTER — Ambulatory Visit (INDEPENDENT_AMBULATORY_CARE_PROVIDER_SITE_OTHER): Payer: Medicare Other | Admitting: Rheumatology

## 2019-02-22 ENCOUNTER — Encounter: Payer: Self-pay | Admitting: Rheumatology

## 2019-02-22 ENCOUNTER — Other Ambulatory Visit: Payer: Self-pay

## 2019-02-22 ENCOUNTER — Ambulatory Visit: Payer: Self-pay

## 2019-02-22 VITALS — BP 111/69 | HR 72 | Resp 13 | Ht 60.0 in | Wt 85.2 lb

## 2019-02-22 DIAGNOSIS — M7711 Lateral epicondylitis, right elbow: Secondary | ICD-10-CM

## 2019-02-22 DIAGNOSIS — M5136 Other intervertebral disc degeneration, lumbar region: Secondary | ICD-10-CM | POA: Diagnosis not present

## 2019-02-22 DIAGNOSIS — M7061 Trochanteric bursitis, right hip: Secondary | ICD-10-CM

## 2019-02-22 DIAGNOSIS — M25552 Pain in left hip: Secondary | ICD-10-CM

## 2019-02-22 DIAGNOSIS — M62838 Other muscle spasm: Secondary | ICD-10-CM | POA: Diagnosis not present

## 2019-02-22 DIAGNOSIS — M5030 Other cervical disc degeneration, unspecified cervical region: Secondary | ICD-10-CM

## 2019-02-22 DIAGNOSIS — Z8739 Personal history of other diseases of the musculoskeletal system and connective tissue: Secondary | ICD-10-CM

## 2019-02-22 DIAGNOSIS — Z8719 Personal history of other diseases of the digestive system: Secondary | ICD-10-CM

## 2019-02-22 DIAGNOSIS — M19042 Primary osteoarthritis, left hand: Secondary | ICD-10-CM

## 2019-02-22 DIAGNOSIS — M797 Fibromyalgia: Secondary | ICD-10-CM | POA: Diagnosis not present

## 2019-02-22 DIAGNOSIS — M810 Age-related osteoporosis without current pathological fracture: Secondary | ICD-10-CM

## 2019-02-22 DIAGNOSIS — M19041 Primary osteoarthritis, right hand: Secondary | ICD-10-CM

## 2019-02-22 DIAGNOSIS — G4709 Other insomnia: Secondary | ICD-10-CM

## 2019-02-22 MED ORDER — TRIAMCINOLONE ACETONIDE 40 MG/ML IJ SUSP
10.0000 mg | INTRAMUSCULAR | Status: AC | PRN
  Administered 2019-02-22: 10 mg via INTRAMUSCULAR

## 2019-02-22 MED ORDER — LIDOCAINE HCL 1 % IJ SOLN
0.5000 mL | INTRAMUSCULAR | Status: AC | PRN
  Administered 2019-02-22: .5 mL

## 2019-02-22 NOTE — Patient Instructions (Addendum)
Back Exercises If you have pain in your back, do these exercises 2-3 times each day or as told by your doctor. When the pain goes away, do the exercises once each day, but repeat the steps more times for each exercise (do more repetitions). If you do not have pain in your back, do these exercises once each day or as told by your doctor. Exercises Single Knee to Chest Do these steps 3-5 times in a row for each leg: 1. Lie on your back on a firm bed or the floor with your legs stretched out. 2. Bring one knee to your chest. 3. Hold your knee to your chest by grabbing your knee or thigh. 4. Pull on your knee until you feel a gentle stretch in your lower back. 5. Keep doing the stretch for 10-30 seconds. 6. Slowly let go of your leg and straighten it. Pelvic Tilt Do these steps 5-10 times in a row: 1. Lie on your back on a firm bed or the floor with your legs stretched out. 2. Bend your knees so they point up to the ceiling. Your feet should be flat on the floor. 3. Tighten your lower belly (abdomen) muscles to press your lower back against the floor. This will make your tailbone point up to the ceiling instead of pointing down to your feet or the floor. 4. Stay in this position for 5-10 seconds while you gently tighten your muscles and breathe evenly. Cat-Cow Do these steps until your lower back bends more easily: 1. Get on your hands and knees on a firm surface. Keep your hands under your shoulders, and keep your knees under your hips. You may put padding under your knees. 2. Let your head hang down, and make your tailbone point down to the floor so your lower back is round like the back of a cat. 3. Stay in this position for 5 seconds. 4. Slowly lift your head and make your tailbone point up to the ceiling so your back hangs low (sags) like the back of a cow. 5. Stay in this position for 5 seconds.  Press-Ups Do these steps 5-10 times in a row: 1. Lie on your belly (face-down) on the floor.  2. Place your hands near your head, about shoulder-width apart. 3. While you keep your back relaxed and keep your hips on the floor, slowly straighten your arms to raise the top half of your body and lift your shoulders. Do not use your back muscles. To make yourself more comfortable, you may change where you place your hands. 4. Stay in this position for 5 seconds. 5. Slowly return to lying flat on the floor.  Bridges Do these steps 10 times in a row: 1. Lie on your back on a firm surface. 2. Bend your knees so they point up to the ceiling. Your feet should be flat on the floor. 3. Tighten your butt muscles and lift your butt off of the floor until your waist is almost as high as your knees. If you do not feel the muscles working in your butt and the back of your thighs, slide your feet 1-2 inches farther away from your butt. 4. Stay in this position for 3-5 seconds. 5. Slowly lower your butt to the floor, and let your butt muscles relax. If this exercise is too easy, try doing it with your arms crossed over your chest. Belly Crunches Do these steps 5-10 times in a row: 1. Lie on your back on a  firm bed or the floor with your legs stretched out. 2. Bend your knees so they point up to the ceiling. Your feet should be flat on the floor. 3. Cross your arms over your chest. 4. Tip your chin a little bit toward your chest but do not bend your neck. 5. Tighten your belly muscles and slowly raise your chest just enough to lift your shoulder blades a tiny bit off of the floor. 6. Slowly lower your chest and your head to the floor. Back Lifts Do these steps 5-10 times in a row: 1. Lie on your belly (face-down) with your arms at your sides, and rest your forehead on the floor. 2. Tighten the muscles in your legs and your butt. 3. Slowly lift your chest off of the floor while you keep your hips on the floor. Keep the back of your head in line with the curve in your back. Look at the floor while you  do this. 4. Stay in this position for 3-5 seconds. 5. Slowly lower your chest and your face to the floor. Contact a doctor if:  Your back pain gets a lot worse when you do an exercise.  Your back pain does not lessen 2 hours after you exercise. If you have any of these problems, stop doing the exercises. Do not do them again unless your doctor says it is okay. Get help right away if:  You have sudden, very bad back pain. If this happens, stop doing the exercises. Do not do them again unless your doctor says it is okay. This information is not intended to replace advice given to you by your health care provider. Make sure you discuss any questions you have with your health care provider. Document Released: 09/19/2010 Document Revised: 05/11/2018 Document Reviewed: 10/11/2014 Elsevier Interactive Patient Education  2019 [employer].  Hip Exercises Ask your health care provider which exercises are safe for you. Do exercises exactly as told by your health care provider and adjust them as directed. It is normal to feel mild stretching, pulling, tightness, or discomfort as you do these exercises, but you should stop right away if you feel sudden pain or your pain gets worse.Do not begin these exercises until told by your health care provider. Stretching and range of motion exercises These exercises warm up your muscles and joints and improve the movement and flexibility of your hip. These exercises also help to relieve pain, numbness, and tingling. Exercise A: Hamstrings, supine  1. Lie on your back. 2. Loop a belt or towel over the ball of your left / rightfoot. The ball of your foot is on the walking surface, right under your toes. 3. Straighten your left / rightknee and slowly pull on the belt to raise your leg. ? Do not let your left / right knee bend while you do this. ? Keep your other leg flat on the floor. ? Raise the left / right leg until you feel a gentle stretch behind your left  / right knee or thigh. 4. Hold this position for __________ seconds. 5. Slowly return your leg to the starting position. Repeat __________ times. Complete this stretch __________ times a day. Exercise B: Hip rotators  1. Lie on your back on a firm surface. 2. Hold your left / right knee with your left / right hand. Hold your ankle with your other hand. 3. Gently pull your left / right knee and rotate your lower leg toward your other shoulder. ? Pull until you  feel a stretch in your buttocks. ? Keep your hips and shoulders firmly planted while you do this stretch. 4. Hold this position for __________ seconds. Repeat __________ times. Complete this stretch __________ times a day. Exercise C: V-sit (hamstrings and adductors)  1. Sit on the floor with your legs extended in a large "V" shape. Keep your knees straight during this exercise. 2. Start with your head and chest upright, then bend at your waist to reach for your left foot (position A). You should feel a stretch in your right inner thigh. 3. Hold this position for __________ seconds. Then slowly return to the upright position. 4. Bend at your waist to reach forward (position B). You should feel a stretch behind both of your thighs and knees. 5. Hold this position for __________ seconds. Then slowly return to the upright position. 6. Bend at your waist to reach for your right foot (position C). You should feel a stretch in your left inner thigh. 7. Hold this position for __________ seconds. Then slowly return to the upright position. Repeat __________ times. Complete this stretch __________ times a day. Exercise D: Lunge (hip flexors)  1. Place your left / right knee on the floor and bend your other knee so that is directly over your ankle. You should be half-kneeling. 2. Keep good posture with your head over your shoulders. 3. Tighten your buttocks to point your tailbone downward. This helps your back to keep from arching too much. 4.  You should feel a gentle stretch in the front of your left / right thigh and hip. If you do not feel any resistance, slightly slide your other foot forward and then slowly lunge forward so your knee once again lines up over your ankle. 5. Make sure your tailbone continues to point downward. 6. Hold this position for __________ seconds. Repeat __________ times. Complete this stretch __________ times a day. Strengthening exercises These exercises build strength and endurance in your hip. Endurance is the ability to use your muscles for a long time, even after they get tired. Exercise E: Bridge (hip extensors)  1. Lie on your back on a firm surface with your knees bent and your feet flat on the floor. 2. Tighten your buttocks muscles and lift your bottom off the floor until the trunk of your body is level with your thighs. ? Do not arch your back. ? You should feel the muscles working in your buttocks and the back of your thighs. If you do not feel these muscles, slide your feet 1-2 inches (2.5-5 cm) farther away from your buttocks. 3. Hold this position for __________ seconds. 4. Slowly lower your hips to the starting position. 5. Let your muscles relax completely between repetitions. 6. If this exercise is too easy, try doing it with your arms crossed over your chest. Repeat __________ times. Complete this exercise __________ times a day. Exercise F: Straight leg raises - hip abductors  1. Lie on your side with your left / right leg in the top position. Lie so your head, shoulder, knee, and hip line up with each other. You may bend your bottom knee to help you balance. 2. Roll your hips slightly forward, so your hips are stacked directly over each other and your left / right knee is facing forward. 3. Leading with your heel, lift your top leg 4-6 inches (10-15 cm). You should feel the muscles in your outer hip lifting. ? Do not let your foot drift forward. ? Do not  let your knee roll toward the  ceiling. 4. Hold this position for __________ seconds. 5. Slowly return to the starting position. 6. Let your muscles relax completely between repetitions. Repeat __________ times. Complete this exercise __________ times a day. Exercise G: Straight leg raises - hip adductors  1. Lie on your side with your left / right leg in the bottom position. Lie so your head, shoulder, knee, and hip line up. You may place your upper foot in front to help you balance. 2. Roll your hips slightly forward, so your hips are stacked directly over each other and your left / right knee is facing forward. 3. Tense the muscles in your inner thigh and lift your bottom leg 4-6 inches (10-15 cm). 4. Hold this position for __________ seconds. 5. Slowly return to the starting position. 6. Let your muscles relax completely between repetitions. Repeat __________ times. Complete this exercise __________ times a day. Exercise H: Straight leg raises - quadriceps  1. Lie on your back with your left / right leg extended and your other knee bent. 2. Tense the muscles in the front of your left / right thigh. When you do this, you should see your kneecap slide up or see increased dimpling just above your knee. 3. Tighten these muscles even more and raise your leg 4-6 inches (10-15 cm) off the floor. 4. Hold this position for __________ seconds. 5. Keep these muscles tense as you lower your leg. 6. Relax the muscles slowly and completely between repetitions. Repeat __________ times. Complete this exercise __________ times a day. Exercise I: Hip abductors, standing 1. Tie one end of a rubber exercise band or tubing to a secure surface, such as a table or pole. 2. Loop the other end of the band or tubing around your left / right ankle. 3. Keeping your ankle with the band or tubing directly opposite of the secured end, step away until there is tension in the tubing or band. Hold onto a chair as needed for balance. 4. Lift your  left / right leg out to your side. While you do this: ? Keep your back upright. ? Keep your shoulders over your hips. ? Keep your toes pointing forward. ? Make sure to use your hip muscles to lift your leg. Do not "throw" your leg or tip your body to lift your leg. 5. Hold this position for __________ seconds. 6. Slowly return to the starting position. Repeat __________ times. Complete this exercise __________ times a day. Exercise J: Squats (quadriceps) 1. Stand in a door frame so your feet and knees are in line with the frame. You may place your hands on the frame for balance. 2. Slowly bend your knees and lower your hips like you are going to sit in a chair. ? Keep your lower legs in a straight-up-and-down position. ? Do not let your hips go lower than your knees. ? Do not bend your knees lower than told by your health care provider. ? If your hip pain increases, do not bend as low. 3. Hold this position for ___________ seconds. 4. Slowly push with your legs to return to standing. Do not use your hands to pull yourself to standing. Repeat __________ times. Complete this exercise __________ times a day. This information is not intended to replace advice given to you by your health care provider. Make sure you discuss any questions you have with your health care provider. Document Released: 09/04/2005 Document Revised: 12/21/2017 Document Reviewed: 08/12/2015 Elsevier Interactive Patient  Education  2019 [employer].

## 2019-03-16 ENCOUNTER — Other Ambulatory Visit: Payer: Self-pay | Admitting: Family Medicine

## 2019-03-16 ENCOUNTER — Other Ambulatory Visit: Payer: Self-pay | Admitting: Rheumatology

## 2019-03-16 DIAGNOSIS — J449 Chronic obstructive pulmonary disease, unspecified: Secondary | ICD-10-CM

## 2019-03-16 NOTE — Telephone Encounter (Signed)
Last Visit: 02/22/19 Next Visit: 09/05/19  Okay to refill per Dr. Estanislado Pandy

## 2019-04-10 ENCOUNTER — Other Ambulatory Visit: Payer: Self-pay | Admitting: Family Medicine

## 2019-04-16 ENCOUNTER — Other Ambulatory Visit: Payer: Self-pay | Admitting: Family Medicine

## 2019-04-16 DIAGNOSIS — J449 Chronic obstructive pulmonary disease, unspecified: Secondary | ICD-10-CM

## 2019-04-17 ENCOUNTER — Other Ambulatory Visit: Payer: Self-pay

## 2019-04-17 ENCOUNTER — Encounter: Payer: Self-pay | Admitting: Family Medicine

## 2019-04-17 ENCOUNTER — Ambulatory Visit (INDEPENDENT_AMBULATORY_CARE_PROVIDER_SITE_OTHER): Payer: Medicare Other | Admitting: Family Medicine

## 2019-04-17 VITALS — Temp 98.7°F | Ht 60.0 in | Wt 87.5 lb

## 2019-04-17 DIAGNOSIS — G8929 Other chronic pain: Secondary | ICD-10-CM

## 2019-04-17 DIAGNOSIS — M542 Cervicalgia: Secondary | ICD-10-CM | POA: Diagnosis not present

## 2019-04-17 DIAGNOSIS — F39 Unspecified mood [affective] disorder: Secondary | ICD-10-CM

## 2019-04-17 DIAGNOSIS — E559 Vitamin D deficiency, unspecified: Secondary | ICD-10-CM

## 2019-04-17 DIAGNOSIS — E785 Hyperlipidemia, unspecified: Secondary | ICD-10-CM | POA: Diagnosis not present

## 2019-04-17 DIAGNOSIS — J449 Chronic obstructive pulmonary disease, unspecified: Secondary | ICD-10-CM

## 2019-04-17 DIAGNOSIS — T1491XA Suicide attempt, initial encounter: Secondary | ICD-10-CM

## 2019-04-17 NOTE — Progress Notes (Signed)
Virtual / live video office visit note for [employer], D.O- Primary Care Physician at [facility]   I connected with current patient today and beyond visually recognizing the correct individual, I verified that I am speaking with the correct person using two identifiers.  . Location of the patient: Home . Location of the provider: Office Only the patient (+/- their family members at pt's discretion) and myself were participating in the encounter    - This visit type was conducted due to national recommendations for restrictions regarding the COVID-19 Pandemic (e.g. social distancing) in an effort to limit this patient's exposure and mitigate transmission in our community.  This format is felt to be most appropriate for this patient at this time.   - The patient did have access to video technology today yet, we had technical difficulties with this method, requiring transitioning to audio only.    - No physical exam could be performed with this format, beyond that communicated to [place] by the patient/ family members as noted.   - Additionally my office staff/ schedulers discussed with the patient that there may be a monetary charge related to this service, depending on patient's medical insurance.   The patient expressed understanding, and agreed to proceed.      History of Present Illness:  Mood Per patient, has chronic neck pain and this contributes to mood difficulty.  Today, states that her mood is "fine," but reiterates "I'm okay, but my neck is bothering me."  Notes "my neck's been killing me for years."  Continues on Celexa daily at 40 mg and tolerates well.  Feels her depression is well-controlled on a regular basis.   Neck Pain & Fibromyalgia She last went and obtained a shot in June to try to alleviate her pain, "but it didn't seem to have done me much good."  States she's going to go back and call in September and see if she can get another one.  Notes "I used the TENS  unit on it over the weekend," and says that this helps.   COPD Confirms that she continues taking her medications, however states she has been using her inhalers every day.  She does take her Symbicort as prescribed.  Notes she is using her rescue inhaler daily, but may be unclear about the use of her rescue inhaler.  Denies high blood pressure or heart palpitations with use of the rescue inhaler.   Blood Pressure at Home Patient has not been checking her BP at home.  States her husband got her a BP cuff, but she hasn't figured out how to use it yet.  She did not check her BP today before the virtual visit.  Says "I didn't have time to mess with it today because my neck was killing me."  Feels her blood pressure is higher when her neck is bothering her.    Depression screen [facility] 2/9 04/17/2019 07/18/2018 03/17/2018  Decreased Interest 1 1 1   Down, Depressed, Hopeless 1 1 1   PHQ - 2 Score 2 2 2   Altered sleeping 2 2 1   Tired, decreased energy 3 2 1   Change in appetite 1 1 1   Feeling bad or failure about yourself  0 1 1  Trouble concentrating 0 1 1  Moving slowly or fidgety/restless 0 1 0  Suicidal thoughts 0 0 0  PHQ-9 Score 8 10 7   Difficult doing work/chores Not difficult at all Somewhat difficult -   GAD 7 :  Generalized Anxiety Score 04/17/2019 07/18/2018  Nervous, Anxious, on Edge 0 2  Control/stop worrying 1 2  Worry too much - different things 1 2  Trouble relaxing 3 2  Restless 0 2  Easily annoyed or irritable 1 2  Afraid - awful might happen 0 2  Total GAD 7 Score 6 14  Anxiety Difficulty Not difficult at all Somewhat difficult      Impression and Recommendations:    1. Mood disorder ([place])   2. Chronic obstructive pulmonary disease, unspecified COPD type ([place])   3. Suicide attempt ([place])   4. Vitamin D deficiency   5. Hyperlipidemia, unspecified hyperlipidemia type   6. Neck pain, chronic     Mood - History of Suicide Attempt - Continues on Celexa daily at 40  mg. - Per patient, tolerates well.  Denies S-E. - Mood stable at this time on current management. - Continue treatment plan as prescribed.  See med list.  - Reviewed the "spokes of the wheel" of mood and health management.  Stressed the importance of ongoing prudent habits, including regular exercise, appropriate sleep hygiene, healthful dietary habits, therapy/counseling PRN, medication PRN, and prayer/meditation to relax.   COPD - Per patient, stable at this time. - Confirms she is tolerating medications well. - Continue management as prescribed.  See med list.  - Per patient, uses her albuterol daily, but has tried to avoid using it sometimes. - Reviewed prudent use of rescue inhaler with patient today. - Explained difference between daily COPD meds and albuterol inhaler. - Advised patient not to take her rescue inhaler more than twice per week. - Per pt, denies racing heart or other sx while taking albuterol.  - Patient knows to call and let us know if she is taking her albuterol inhaler more than twice per week.  Will continue to monitor.   Blood Pressure Monitoring at Home - Per pt, recently obtained blood pressure cuff. - Advised patient to check her blood pressure at least twice weekly. - Will continue to monitor.   Chronic Neck Pain - Followed by Rheumatology. - Per pt, history of injections for pain relief in past. - Advised patient to continue to obtain management through specialist as established. - Will continue to monitor.   Hyperlipidemia - Need for re-check in near future. - Will continue to monitor.   Vitamin D Deficiency - Need for re-check in near future. - Continue supplementation as prescribed. - Will continue to monitor.   Lifestyle & Preventative Health Maintenance - Advised patient to continue working toward physical conditioning to improve overall mental, physical, and emotional health.    - Healthy dietary habits encouraged, including  low-carb, and high amounts of lean protein in diet.   - Patient should also consume adequate amounts of water.  Recommendations - Strongly encouraged patient to return in near future for CPE and fasting lab work. - Educated patient extensively regarding need for prudent follow up as scheduled.  - As part of my medical decision making, I reviewed the following data within the [place] History obtained from pt /family, CMA notes reviewed and incorporated if applicable, Labs reviewed, Radiograph/ tests reviewed if applicable and OV notes from prior OV's with me, as well as other specialists she/he has seen since seeing me last, were all reviewed and used in my medical decision making process today.   - Additionally, discussion had with patient regarding txmnt plan, their biases about that plan etc were used in my medical decision making  today.   - The patient agreed with the plan and demonstrated an understanding of the instructions.   No barriers to understanding were identified.   - Red flag symptoms and signs discussed in detail.  Patient expressed understanding regarding what to do in case of emergency\ urgent symptoms.  The patient was advised to call back or seek an in-person evaluation if the symptoms worsen or if the condition fails to improve as anticipated.   Return for 1-2 mo for Medicare Wellness over phone, but also needs full set FBW in the am.     Note:  This note was prepared with assistance of Dragon voice recognition software. Occasional wrong-word or sound-a-like substitutions may have occurred due to the inherent limitations of voice recognition software.  This document serves as a record of services personally performed by Mellody Dance, DO. It was created on her behalf by Toni Amend, a trained medical scribe. The creation of this record is based on the scribe's personal observations and the provider's statements to them.   I have reviewed the above  medical documentation for accuracy and completeness and I concur.  Mellody Dance, DO 04/19/2019 7:18 PM       Patient Care Team    Relationship Specialty Notifications Start End  Mellody Dance, DO PCP - General Family Medicine  Aug 31, 2017   Bo Merino, MD Consulting Physician Rheumatology  10/02/14   West Pugh, NP (Inactive) Nurse Practitioner Gynecology  10/02/14   Richmond Campbell, MD Consulting Physician Gastroenterology  Aug 31, 2017   Druscilla Brownie, MD Consulting Physician Dermatology  2017/08/31     -Vitals obtained; medications/ allergies reconciled;  personal medical, social, Sx etc.histories were updated by CMA, reviewed by me and are reflected in chart  Patient Active Problem List   Diagnosis Date Noted  . Mood disorder ([place]) 03/17/2018    Priority: High  . Suicide attempt [facility])     Priority: High  . Family history of colon cancer- sister died 76yo August 31, 2017    Priority: High  . Adjustment disorder with mixed anxiety < depressed mood 2017-08-31    Priority: High  . Malignant neoplasm of female breast ([place]) 11/23/2016    Priority: High  . COPD (chronic obstructive pulmonary disease) ([place]) 11/20/2013    Priority: High  . h/o HYPERLIPIDEMIA 10/18/2007    Priority: High  . History of gastroesophageal reflux (GERD) 11/23/2016    Priority: Medium  . CARDIOMYOPATHY, PRIMARY, DILATED 01/01/2009    Priority: Medium  . ANEMIA, B12 DEFICIENCY 10/12/2007    Priority: Medium  . Anxiety,depression, insomnia 10/12/2007    Priority: Medium  . IBS 10/12/2007    Priority: Medium  . Fibromyalgia--Dr. Herold Harms, on Ultram 07/14/2007    Priority: Medium  . Family history of diabetes mellitus- mom in 50's August 31, 2017    Priority: Low    Class: Chronic  . B12 deficiency August 31, 2017    Priority: Low  . Other insomnia 11/23/2016    Priority: Low  . Vitamin D deficiency 07/06/2011    Priority: Low  . Osteoporosis 10/12/2007    Priority: Low  . Osteopenia after  menopause 11/06/2017  . Medial epicondylitis of right elbow 10/14/2017  . MDD (major depressive disorder), severe ([place]) 09/22/2017  . MDD (major depressive disorder), recurrent episode, moderate ([place])   . Prolonged Q-T interval on ECG 09/20/2017  . Overdose 09/20/2017  . Acute cystitis with hematuria   . Fibromyalgia syndrome 11/23/2016  . Trapezius muscle spasm 11/23/2016  . DDD (degenerative disc disease), cervical 11/23/2016  .  DDD (degenerative disc disease), lumbar 11/23/2016  . Other idiopathic scoliosis, lumbar region 11/23/2016  . PCP NOTES >>>>>>>>>>>>>>>>>>>> 11/21/2015  . Constipation 02/15/2015  . Annual physical exam 11/20/2013  . Unspecified adverse effect of unspecified drug, medicinal and biological substance 12/10/2011  . BREAST CANCER, HX OF 11/02/2008  . GERD 10/18/2007  . Primary osteoarthritis of both hands 10/12/2007  . COLONIC POLYPS, HX OF 10/12/2007  . MIGRAINES, HX OF 10/12/2007     Current Meds  Medication Sig  . Calcium Carbonate-Vitamin D 600-400 MG-UNIT tablet Take 1 tablet by mouth 2 (two) times daily.  . cetirizine (ZYRTEC) 10 MG tablet Take 10 mg by mouth daily.  . citalopram (CELEXA) 40 MG tablet Take 1 tablet (40 mg total) by mouth daily. For mood control  . methocarbamol (ROBAXIN) 750 MG tablet TAKE 1 TABLET BY MOUTH 2 TIMES DAILY AS NEEDED FOR MUSCLE SPASMS  . PROAIR HFA 108 (90 Base) MCG/ACT inhaler INHALE 2 PUFFS INTO THE LUNGS EVERY 4 (FOUR) HOURS AS NEEDED FOR WHEEZING OR SHORTNESS OF BREATH.  . Probiotic Product (PROBIOTIC PO) Take 1 tablet by mouth daily.  . SYMBICORT 80-4.5 MCG/ACT inhaler INHALE 2 PUFFS INTO THE LUNGS 2 TIMES A DAY.NEEDS OFFICE VISIT FOR FUTHER REFILLS  . Vitamin D, Ergocalciferol, (DRISDOL) 1.25 MG (50000 UT) CAPS capsule TAKE 1 CAPSULE (50,000 UNITS TOTAL) BY MOUTH EVERY 7 (SEVEN) DAYS.     Allergies  Allergen Reactions  . Amoxicillin-Pot Clavulanate     diarrhea  . Doxycycline     Nausea & vomiting  .  Venlafaxine     ? Reaction; ? Blurred vision     ROS:  See above HPI for pertinent positives and negatives   Objective:   Temperature 98.7 F (37.1 C), temperature source Oral, height 5' (1.524 m), weight 87 lb 8 oz (39.7 kg).  (if some vitals are omitted, this means that patient was UNABLE to obtain them even though they were asked to get them prior to OV today.  They were asked to call us at their earliest convenience with these once obtained.)  General: A & O * 3; visually in no acute distress; in usual state of health.  Skin: Visible skin appears normal and pt's usual skin color HEENT:  EOMI, head is normocephalic and atraumatic.  Sclera are anicteric. Neck has a good range of motion.  Lips are noncyanotic Chest: normal chest excursion and movement Respiratory: speaking in full sentences, no conversational dyspnea; no use of accessory muscles Psych: insight good, mood- appears full

## 2019-05-01 ENCOUNTER — Telehealth: Payer: Self-pay | Admitting: Family Medicine

## 2019-05-01 ENCOUNTER — Other Ambulatory Visit: Payer: Self-pay | Admitting: Rheumatology

## 2019-05-01 NOTE — Telephone Encounter (Signed)
I just had a visit with her on the 17th of this month/ Aug and she did not indicate a concern.    My plan was per below: COPD - Per patient, stable at this time. - Confirms she is tolerating medications well. - Continue management as prescribed.  See med list.  - Per patient, uses her albuterol daily, but has tried to avoid using it sometimes. - Reviewed prudent use of rescue inhaler with patient today. - Explained difference between daily COPD meds and albuterol inhaler. - Advised patient not to take her rescue inhaler more than twice per week. - Per pt, denies racing heart or other sx while taking albuterol.  - Patient knows to call and let us know if she is taking her albuterol inhaler more than twice per week.  Will continue to monitor   She was seen by Dr. Keturah Barre, pulmonologist, in the past and had her PFTs last done 03/31/2017.  This needs to be repeated since it is been over 2 years and especially since patient feels she needs an increase in her meds.  At this time the most prudent step to take is to have her follow-up with Dr. Keturah Barre, her pulmonologist so that they can do further testing and evaluation and modification of her medicines if needed.  Please remind her that Dr. Larose Kells her prior PCP, had sent her to Dr. Annamaria Boots for COPD evaluation yrs ago.   -Please have patient call for appointment or if she needs referral, please go ahead and refer to Dr. Annamaria Boots again.  Diagnosis is COPD

## 2019-05-01 NOTE — Telephone Encounter (Signed)
LVM for pt to return call to discuss.  Charyl Bigger, CMA

## 2019-05-01 NOTE — Telephone Encounter (Signed)
Last Visit: 02/22/19 Next Visit: 09/05/19  Okay to refill per Dr. Deveshwar  

## 2019-05-01 NOTE — Telephone Encounter (Signed)
Pt is requesting to increase dosage of Symbicort.  She feels that her current dosage is not "helping enough" and even gets winded talking.  Please advise.  Charyl Bigger, CMA

## 2019-05-01 NOTE — Telephone Encounter (Signed)
Patient is wishing to speak with clinic staff about inhaler that prescribed and the effectiveness of this inhaler. She can be reached at [phone]

## 2019-05-02 NOTE — Telephone Encounter (Signed)
LVM informing pt of recommendations.  Charyl Bigger, CMA

## 2019-05-09 ENCOUNTER — Telehealth: Payer: Self-pay | Admitting: Family Medicine

## 2019-05-09 NOTE — Telephone Encounter (Signed)
Patient called states she was told to call Pulmonologist for inhaler Rx refill (but she could not remember doctor's name or ph# )---- Looked up information & provided it to patient.  --glh

## 2019-05-18 ENCOUNTER — Ambulatory Visit: Payer: Medicare Other | Admitting: Internal Medicine

## 2019-05-18 ENCOUNTER — Ambulatory Visit (INDEPENDENT_AMBULATORY_CARE_PROVIDER_SITE_OTHER): Payer: Medicare Other | Admitting: Internal Medicine

## 2019-05-18 ENCOUNTER — Ambulatory Visit (INDEPENDENT_AMBULATORY_CARE_PROVIDER_SITE_OTHER): Payer: Medicare Other

## 2019-05-18 ENCOUNTER — Other Ambulatory Visit: Payer: Self-pay

## 2019-05-18 ENCOUNTER — Encounter: Payer: Self-pay | Admitting: Internal Medicine

## 2019-05-18 VITALS — BP 104/54 | HR 98 | Temp 97.9°F | Ht 60.0 in | Wt 88.4 lb

## 2019-05-18 DIAGNOSIS — C50912 Malignant neoplasm of unspecified site of left female breast: Secondary | ICD-10-CM | POA: Diagnosis not present

## 2019-05-18 DIAGNOSIS — J439 Emphysema, unspecified: Secondary | ICD-10-CM | POA: Diagnosis not present

## 2019-05-18 DIAGNOSIS — C50911 Malignant neoplasm of unspecified site of right female breast: Secondary | ICD-10-CM | POA: Diagnosis not present

## 2019-05-18 DIAGNOSIS — J449 Chronic obstructive pulmonary disease, unspecified: Secondary | ICD-10-CM | POA: Diagnosis not present

## 2019-05-18 MED ORDER — TRELEGY ELLIPTA 100-62.5-25 MCG/INH IN AEPB: 1.0000 | INHALATION_SPRAY | Freq: Every day | RESPIRATORY_TRACT | 0 refills | Status: DC

## 2019-05-18 MED ORDER — TRELEGY ELLIPTA 100-62.5-25 MCG/INH IN AEPB: 1.0000 | INHALATION_SPRAY | Freq: Every day | RESPIRATORY_TRACT | 12 refills | Status: DC

## 2019-05-18 NOTE — Progress Notes (Signed)
05/18/2019- 86 yoF former smoker for pulmonary evaluation. Medical problem list includes Dilated Cardiomyopathy, COPD, IBS, GERD, Degenerative Disc Disease, Depression, Hx Breast Cancer/ Bilat mastectomy, no XRT, Chemo stopped due to cardiomyopathy, Fibromyalgia,  Proair hfa, Symbicort 80, Inhalers not clearly helpful. Notes mainly DOE esp picking up and carrying something heavy, or hurrying. Little cough or wheeze. Denies hx of pneumonia, Covid exposure, allergic rhinitis.  Cardiomyopathy limited chemotherapy but she doesn't  think it is an ongoing issue.  ENT+ septoplasty. No recent colds. Breathes ok while sleeping.  Retired from Radiographer, therapeutic work.  PFT 03/31/17- Severe obstruction, Response to BD, DLCO mod reduced. FVC 2.20/ 83%, FEV1 1.06/ 52%, R 0.48, TLC 125%, DLCO 49%,  Lab-10/04/17 Hgb 11.7 CXR 03/22/17-  FINDINGS: Cardiac and mediastinal contours remain within normal limits. Surgical clips project over the soft tissues of the right and left chest as well of the left axilla. Bilateral breast reconstruction prostheses are present. The lungs are hyperinflated with increased lucency in the upper zones consistent with the clinical history of underlying emphysema. No focal airspace opacity, pleural effusion or pneumothorax. No suspicious pulmonary mass or nodule. Incompletely imaged cervicothoracic fusion hardware. No acute osseous abnormality. Dextroconvex scoliosis of the upper lumbar spine. IMPRESSION: Stable chest x-ray without evidence of acute cardiopulmonary Process.  Prior to Admission medications   Medication Sig Start Date End Date Taking? Authorizing Provider  Calcium Carbonate-Vitamin D 600-400 MG-UNIT tablet Take 1 tablet by mouth 2 (two) times daily. 10/14/17   Opalski, Neoma Laming, DO  cetirizine (ZYRTEC) 10 MG tablet Take 10 mg by mouth daily.    [provider]  citalopram (CELEXA) 40 MG tablet Take 1 tablet (40 mg total) by mouth daily. For mood control 03/16/18    Opalski, Neoma Laming, DO  Fluticasone-Umeclidin-Vilant (TRELEGY ELLIPTA) 100-62.5-25 MCG/INH AEPB Inhale 1 puff into the lungs daily. Rinse mouth 05/18/19   Caron Ode, Tarri Fuller D, MD  methocarbamol (ROBAXIN) 750 MG tablet TAKE 1 TABLET BY MOUTH 2 TIMES DAILY AS NEEDED FOR MUSCLE SPASMS (NON FORMULARY) 05/01/19   Bo Merino, MD  PROAIR HFA 108 (90 Base) MCG/ACT inhaler INHALE 2 PUFFS INTO THE LUNGS EVERY 4 (FOUR) HOURS AS NEEDED FOR WHEEZING OR SHORTNESS OF BREATH. 04/10/19   Opalski, Neoma Laming, DO  Probiotic Product (PROBIOTIC PO) Take 1 tablet by mouth daily.    [provider]  Spacer/Aero-Holding Chambers (VORTEX VALVED HOLDING CHAMBER) [place] See admin instructions. use with inhaler 03/17/18   [provider]  SYMBICORT 80-4.5 MCG/ACT inhaler INHALE 2 PUFFS INTO THE LUNGS 2 TIMES A DAY.NEEDS OFFICE VISIT FOR FUTHER REFILLS 03/16/19   Mellody Dance, DO  Vitamin D, Ergocalciferol, (DRISDOL) 1.25 MG (50000 UT) CAPS capsule TAKE 1 CAPSULE (50,000 UNITS TOTAL) BY MOUTH EVERY 7 (SEVEN) DAYS. 11/22/18   Mellody Dance, DO   Past Medical History:  Diagnosis Date  . Anemia    B12 deficient  . B12 deficiency   . Breast cancer ([place])    left breast.  . Chronic SI joint pain    Bilateral, Dr. Estanislado Pandy  . Fibromyalgia    Dr. Estanislado Pandy  . GERD (gastroesophageal reflux disease)   . Greater trochanteric bursitis of both hips    Dr. Estanislado Pandy  . Hyperlipidemia   . IBS (irritable bowel syndrome)   . LV dysfunction    iatrogenic from chemotherapy ; on Carvedilol   . Migraines   . Osteoporosis    Dr Lomax---> transferring to a new gyn   Past Surgical History:  Procedure Laterality Date  . ABDOMINAL  HYSTERECTOMY     BSO for  Endometriosis  . CARPAL TUNNEL RELEASE     left  . CERVICAL DISC SURGERY    . COLONOSCOPY W/ POLYPECTOMY  1999   Dr Earlean Shawl  . ESOPHAGEAL DILATION  2005   Dr Earlean Shawl  . MASTECTOMY  2009   bilateral, Dr Margot Chimes  . SEPTOPLASTY     Family History  Problem  Relation Age of Onset  . Diabetes Mother   . Breast cancer Mother   . Depression Mother        anxiety  . Heart disease Mother        in her 59s  . Colon cancer Sister   . Healthy Daughter   . Stroke Neg Hx    Social History   Socioeconomic History  . Marital status: Married    Spouse name: Not on file  . Number of children: 1  . Years of education: Not on file  . Highest education level: Not on file  Occupational History  . Occupation: retired, Medical sales representative  . Financial resource strain: Not on file  . Food insecurity    Worry: Not on file    Inability: Not on file  . Transportation needs    Medical: Not on file    Non-medical: Not on file  Tobacco Use  . Smoking status: Former Smoker    Packs/day: 1.00    Years: 30.00    Pack years: 30.00    Types: Cigarettes    Quit date: 08/31/1990    Years since quitting: 28.7  . Smokeless tobacco: Never Used  Substance and Sexual Activity  . Alcohol use: No  . Drug use: Not Currently    Types: Marijuana    Comment: 08/01/2017 last used   . Sexual activity: Not Currently  Lifestyle  . Physical activity    Days per week: Not on file    Minutes per session: Not on file  . Stress: Not on file  Relationships  . Social Herbalist on phone: Not on file    Gets together: Not on file    Attends religious service: Not on file    Active member of club or organization: Not on file    Attends meetings of clubs or organizations: Not on file    Relationship status: Not on file  . Intimate partner violence    Fear of current or ex partner: Not on file    Emotionally abused: Not on file    Physically abused: Not on file    Forced sexual activity: Not on file  Other Topics Concern  . Not on file  Social History Narrative   Lives w/ husband   ROS-see HPI   + = positive Constitutional:    weight loss, night sweats, fevers, chills, fatigue, lassitude. HEENT:    headaches, difficulty swallowing, tooth/dental  problems, sore throat,       sneezing, itching, ear ache, nasal congestion, post nasal drip, snoring CV:    chest pain, orthopnea, PND, swelling in lower extremities, anasarca,                                  dizziness, palpitations Resp:  + shortness of breath with exertion or at rest.                productive cough,   non-productive cough, coughing up of blood.  change in color of mucus.  wheezing.   Skin:    rash or lesions. GI:  No-   heartburn, indigestion, abdominal pain, nausea, vomiting, diarrhea,                 change in bowel habits, loss of appetite GU: dysuria, change in color of urine, no urgency or frequency.   flank pain. MS:   joint pain, stiffness, decreased range of motion, back pain. Neuro-     nothing unusual Psych:  change in mood or affect.  depression or anxiety.   memory loss.  OBJ- Physical Exam General- Alert, Oriented, Affect-appropriate, Distress- none acute, petite Skin- rash-none, lesions- none, excoriation- none Lymphadenopathy- none Head- atraumatic            Eyes- Gross vision intact, PERRLA, conjunctivae and secretions clear            Ears- Hearing, canals-normal            Nose- Clear, no-Septal dev, mucus, polyps, erosion, perforation             Throat- Mallampati II-III , mucosa clear , drainage- none, tonsils- atrophic Neck- flexible , trachea midline, no stridor , thyroid nl, carotid no bruit Chest - symmetrical excursion , unlabored           Heart/CV- RRR , no murmur , no gallop  , no rub, nl s1 s2                           - JVD- none , edema- none, stasis changes- none, varices- none           Lung- clear to P&A/ distant, wheeze- none, cough- none , dullness-none, rub- none           Chest wall- +bilateral reconstruction after breast cancer Abd-  Br/ Gen/ Rectal- Not done, not indicated Extrem- cyanosis- none, clubbing, none, atrophy- none, strength- nl Neuro- grossly intact to observation

## 2019-05-18 NOTE — Patient Instructions (Addendum)
I recommend your doctor give you both the Influenza and the Pneumovax-23 vaccinations tomorrow.  Order - CXR   Dx COPD mixed type  Sample Trelegy Inhaler    Inhale 1 puff, then rinse mouth, once daily. Try this instead of Symbicort. If the sample helps your breathing, then fill the prescription.  We will look at other options over time as we see how you are doing.  Please call if we can help.

## 2019-05-19 ENCOUNTER — Ambulatory Visit: Payer: Medicare Other | Admitting: Family Medicine

## 2019-05-19 NOTE — Assessment & Plan Note (Signed)
Emphysema predominant. May not be able to get much benefit from bronchodilators, Plan- CXR, Trial of Trelegy instead of Symbicort           When she sees PCP soon I suggest both Flu vax an Pneumovax-23 On our f/u we will plan on checking a1AT assay, Consider Pulmonary Rehab

## 2019-05-19 NOTE — Assessment & Plan Note (Signed)
Appears to be in long-term remission. Followed by Oncology

## 2019-06-08 ENCOUNTER — Other Ambulatory Visit: Payer: Self-pay | Admitting: Rheumatology

## 2019-06-08 NOTE — Telephone Encounter (Signed)
Last Visit: 02/22/19 Next Visit: 09/05/19  Okay to refill per Dr. Deveshwar  

## 2019-06-28 ENCOUNTER — Other Ambulatory Visit: Payer: Medicare Other

## 2019-06-28 ENCOUNTER — Other Ambulatory Visit: Payer: Self-pay

## 2019-06-28 ENCOUNTER — Encounter: Payer: Self-pay | Admitting: Family Medicine

## 2019-06-28 ENCOUNTER — Telehealth: Payer: Self-pay | Admitting: Family Medicine

## 2019-06-28 ENCOUNTER — Ambulatory Visit (INDEPENDENT_AMBULATORY_CARE_PROVIDER_SITE_OTHER): Payer: Medicare Other | Admitting: Family Medicine

## 2019-06-28 VITALS — BP 119/70 | HR 77 | Temp 98.1°F | Resp 12 | Ht 60.0 in | Wt 87.9 lb

## 2019-06-28 DIAGNOSIS — Z1159 Encounter for screening for other viral diseases: Secondary | ICD-10-CM

## 2019-06-28 DIAGNOSIS — E559 Vitamin D deficiency, unspecified: Secondary | ICD-10-CM

## 2019-06-28 DIAGNOSIS — E2839 Other primary ovarian failure: Secondary | ICD-10-CM | POA: Diagnosis not present

## 2019-06-28 DIAGNOSIS — Z833 Family history of diabetes mellitus: Secondary | ICD-10-CM

## 2019-06-28 DIAGNOSIS — E782 Mixed hyperlipidemia: Secondary | ICD-10-CM

## 2019-06-28 DIAGNOSIS — Z1211 Encounter for screening for malignant neoplasm of colon: Secondary | ICD-10-CM

## 2019-06-28 DIAGNOSIS — Z114 Encounter for screening for human immunodeficiency virus [HIV]: Secondary | ICD-10-CM

## 2019-06-28 DIAGNOSIS — Z0000 Encounter for general adult medical examination without abnormal findings: Secondary | ICD-10-CM

## 2019-06-28 DIAGNOSIS — Z780 Asymptomatic menopausal state: Secondary | ICD-10-CM | POA: Diagnosis not present

## 2019-06-28 DIAGNOSIS — Z23 Encounter for immunization: Secondary | ICD-10-CM

## 2019-06-28 MED ORDER — PNEUMOVAX 23 25 MCG/0.5ML IJ INJ: 0.5000 mL | INJECTION | INTRAMUSCULAR | 0 refills | Status: AC

## 2019-06-28 MED ORDER — SHINGRIX 50 MCG/0.5ML IM SUSR: 0.5000 mL | Freq: Once | INTRAMUSCULAR | 0 refills | Status: AC

## 2019-06-28 NOTE — Telephone Encounter (Signed)
Noted.  T. Nelson, CMA 

## 2019-06-28 NOTE — Telephone Encounter (Signed)
Patient called states needed to share the name of  Provider w/ PCP--- per pt Dr. Earlean Shawl was the doctor's name.  ---Forwarding information to medical asst.  -glh

## 2019-06-28 NOTE — Progress Notes (Signed)
Subjective:   [name] is a 68 y.o. female who presents for Medicare Annual (Subsequent) preventive examination.  [name], am serving as scribe for [name].  Patient no longer with need for mammogram (has had mastectomy, history of breast cancer), or pap smear due to age.   Per patient, last colonoscopy was ten years ago; cannot remember the practice, notes "he's by himself." Says she was told her screening was abnormal in the past. She was told to repeat in five years. "It was abnormal a long long time ago and I was doing it every five years, and then I just kind of let it slide." Says "I know what his name is but I can't think of it; I can see his face in my head."   Last DEXA done in 2014.   She was a smoker in the past, for 30 years or more. She quit smoking about 25 years ago.   Has not had shingles vaccine.   Patient denies concerns about Wellness questionnaire today. Confirms that she does have some hearing difficulties. She wears hearing aids. Notes "I lost one of 'em, so I only have one." Says "I can only hear out this [one] ear." Says she thinks it's under her bed because she lost it after laying down to go to sleep.   Confirms she's not exercising much; states "I'm kinda lazy."    Review of Systems: General:   Denies fever, chills, unexplained weight loss.  Optho/Auditory:   Denies visual changes, blurred vision/LOV Respiratory:   Denies SOB, DOE more than baseline levels.  Cardiovascular:   Denies chest pain, palpitations, new onset peripheral edema  Gastrointestinal:   Denies nausea, vomiting, diarrhea.  Genitourinary: Denies dysuria, freq/ urgency, flank pain or discharge from genitals.  Endocrine:     Denies hot or cold intolerance, polyuria, polydipsia. Musculoskeletal:   Denies unexplained myalgias, joint swelling, unexplained arthralgias, gait problems.  Skin:  Denies rash, suspicious lesions Neurological:     Denies dizziness,  unexplained weakness, numbness  Psychiatric/Behavioral:   Denies mood changes, suicidal or homicidal ideations, hallucinations    Objective:     Vitals: BP 119/70 (BP Location: Left Arm, Patient Position: Sitting, Cuff Size: Normal)   Pulse 77   Temp 98.1 F (36.7 C) (Oral)   Resp 12   Ht 5' (1.524 m)   Wt 87 lb 14.4 oz (39.9 kg)   SpO2 93%   BMI 17.17 kg/m   Body mass index is 17.17 kg/m.  Advanced Directives 09/20/2017 08/03/2017 07/10/2014  Does Patient Have a Medical Advance Directive? No No No  Would patient like information on creating a medical advance directive? No - Patient declined No - Patient declined -  Some encounter information is confidential and restricted. Go to Review Flowsheets activity to see all data.    Tobacco Social History   Tobacco Use  Smoking Status Former Smoker  . Packs/day: 1.00  . Years: 30.00  . Pack years: 30.00  . Types: Cigarettes  . Quit date: 08/31/1990  . Years since quitting: 28.8  Smokeless Tobacco Never Used     Counseling given: Not Answered    Past Medical History:  Diagnosis Date  . Anemia    B12 deficient  . B12 deficiency   . Breast cancer ([place])    left breast.  . Chronic SI joint pain    Bilateral, Dr. Estanislado Pandy  . Fibromyalgia    Dr. Estanislado Pandy  . GERD (gastroesophageal reflux disease)   .  Greater trochanteric bursitis of both hips    Dr. Estanislado Pandy  . Hyperlipidemia   . IBS (irritable bowel syndrome)   . LV dysfunction    iatrogenic from chemotherapy ; on Carvedilol   . Migraines   . Osteoporosis    Dr Lomax---> transferring to a new gyn   Past Surgical History:  Procedure Laterality Date  . ABDOMINAL HYSTERECTOMY     BSO for  Endometriosis  . CARPAL TUNNEL RELEASE     left  . CERVICAL DISC SURGERY    . COLONOSCOPY W/ POLYPECTOMY  1999   Dr Earlean Shawl  . ESOPHAGEAL DILATION  2005   Dr Earlean Shawl  . MASTECTOMY  2009   bilateral, Dr Margot Chimes  . SEPTOPLASTY     Family History  Problem Relation Age of  Onset  . Diabetes Mother   . Breast cancer Mother   . Depression Mother        anxiety  . Heart disease Mother        in her 69s  . Colon cancer Sister   . Healthy Daughter   . Stroke Neg Hx    Social History   Socioeconomic History  . Marital status: Married    Spouse name: Not on file  . Number of children: 1  . Years of education: Not on file  . Highest education level: Not on file  Occupational History  . Occupation: retired, Medical sales representative  . Financial resource strain: Not on file  . Food insecurity    Worry: Not on file    Inability: Not on file  . Transportation needs    Medical: Not on file    Non-medical: Not on file  Tobacco Use  . Smoking status: Former Smoker    Packs/day: 1.00    Years: 30.00    Pack years: 30.00    Types: Cigarettes    Quit date: 08/31/1990    Years since quitting: 28.8  . Smokeless tobacco: Never Used  Substance and Sexual Activity  . Alcohol use: No  . Drug use: Not Currently    Types: Marijuana    Comment: 08/01/2017 last used   . Sexual activity: Not Currently  Lifestyle  . Physical activity    Days per week: Not on file    Minutes per session: Not on file  . Stress: Not on file  Relationships  . Social Herbalist on phone: Not on file    Gets together: Not on file    Attends religious service: Not on file    Active member of club or organization: Not on file    Attends meetings of clubs or organizations: Not on file    Relationship status: Not on file  Other Topics Concern  . Not on file  Social History Narrative   Lives w/ husband    Outpatient Encounter Medications as of 06/28/2019  Medication Sig  . Calcium Carbonate-Vitamin D 600-400 MG-UNIT tablet Take 1 tablet by mouth 2 (two) times daily.  . cetirizine (ZYRTEC) 10 MG tablet Take 10 mg by mouth daily.  . citalopram (CELEXA) 40 MG tablet Take 1 tablet (40 mg total) by mouth daily. For mood control  . Fluticasone-Umeclidin-Vilant (TRELEGY  ELLIPTA) 100-62.5-25 MCG/INH AEPB Inhale 1 puff into the lungs daily. Rinse mouth  . methocarbamol (ROBAXIN) 750 MG tablet TAKE 1 TABLET BY MOUTH 2 TIMES DAILY AS NEEDED FOR MUSCLE SPASMS (NON FORMULARY)  . PROAIR HFA 108 (90 Base) MCG/ACT inhaler INHALE 2  PUFFS INTO THE LUNGS EVERY 4 (FOUR) HOURS AS NEEDED FOR WHEEZING OR SHORTNESS OF BREATH.  . Probiotic Product (PROBIOTIC PO) Take 1 tablet by mouth daily.  Marland Kitchen Spacer/Aero-Holding Chambers (VORTEX VALVED HOLDING CHAMBER) DEVI See admin instructions. use with inhaler  . Vitamin D, Ergocalciferol, (DRISDOL) 1.25 MG (50000 UT) CAPS capsule TAKE 1 CAPSULE (50,000 UNITS TOTAL) BY MOUTH EVERY 7 (SEVEN) DAYS.  Marland Kitchen pneumococcal 23 valent vaccine (PNEUMOVAX 23) 25 MCG/0.5ML injection Inject 0.5 mLs into the muscle tomorrow at 10 am for 1 dose.  . SYMBICORT 80-4.5 MCG/ACT inhaler INHALE 2 PUFFS INTO THE LUNGS 2 TIMES A DAY.NEEDS OFFICE VISIT FOR FUTHER REFILLS (Patient not taking: Reported on 06/28/2019)  . Zoster Vaccine Adjuvanted [facility]) injection Inject 0.5 mLs into the muscle once for 1 dose.   No facility-administered encounter medications on file as of 06/28/2019.     Activities of Daily Living In your present state of health, do you have any difficulty performing the following activities: 06/28/2019  Hearing? Y  Vision? Y  Difficulty concentrating or making decisions? N  Walking or climbing stairs? N  Dressing or bathing? N  Doing errands, shopping? N  Some recent data might be hidden    Patient Care Team: Mellody Dance, DO as PCP - General (Family Medicine) Bo Merino, MD as Consulting Physician (Rheumatology) West Pugh, NP (Inactive) as Nurse Practitioner (Gynecology) Richmond Campbell, MD as Consulting Physician (Gastroenterology) Druscilla Brownie, MD as Consulting Physician (Dermatology) Deneise Lever, MD as Consulting Physician (Pulmonary Disease)    Assessment:   This is a routine wellness examination for  Aishat.  Exercise Activities and Dietary recommendations    Goals   None     Fall Risk Fall Risk  06/28/2019 03/17/2018 08/03/2017 03/22/2017 11/21/2015  Falls in the past year? 0 No No No No  Number falls in past yr: 0 - - - -  Injury with Fall? 0 - - - -  Follow up Falls evaluation completed - - - -   Is the patient's home free of loose throw rugs in walkways, pet beds, electrical cords, etc?   no      Grab bars in the bathroom? no      Handrails on the stairs?   yes      Adequate lighting?   yes  Timed Get Up and Go performed: Telehealth  Depression Screen PHQ 2/9 Scores 06/28/2019 04/17/2019 07/18/2018 03/17/2018  PHQ - 2 Score 0 2 2 2   PHQ- 9 Score 2 8 10 7      Cognitive Function 6CIT Screen 06/28/2019  What Year? 0 points  What month? 0 points  What time? 0 points  Count back from 20 0 points  Months in reverse 0 points  Repeat phrase 0 points  Total Score 0    Immunization History  Administered Date(s) Administered  . Influenza Whole 07/14/2007, 05/31/2012  . Influenza, High Dose Seasonal PF 07/18/2018  . Td 04/01/1998  . Tdap 10/29/2011    Qualifies for Shingles Vaccine? RX sent to pharmacy Pneumococcal Vaccine: RX sent to pharmacy    Screening Tests Health Maintenance  Topic Date Due  . Hepatitis C Screening  [birth year]  . COLONOSCOPY  08/31/2017  . INFLUENZA VACCINE  04/01/2019  . PNA vac Low Risk Adult (1 of 2 - PCV13) 07/19/2019 (Originally 08/10/2015)  . TETANUS/TDAP  10/28/2021  . DEXA SCAN  Completed    Cancer Screenings: Lung: Low Dose CT Chest recommended if Age 9-80 years, 30 pack-year currently smoking  OR have quit w/in 15years. Patient does not qualify. Breast:  Up to date on Mammogram? Does not need. Mastectomy     Up to date of Bone Density/Dexa? No- Ordered today  Colorectal:Ordered today  Additional Screenings:  Hepatitis C Screening: Lab collected today HIV Screening: Lab collected today     Plan:   Orders Placed This  Encounter  Procedures  . Flu Vaccine QUAD High Dose(Fluad)  . Hepatitis C Antibody  . HIV antibody (with reflex)  . Ambulatory referral to Gastroenterology    PLAN:  - DEXA ordered today.  - Advised patient to obtain records of historical colonoscopy and send them to clinic as discussed. - Colonoscopy ordered today.  - Patient no longer with need for mammogram due to mastectomy, or pap smear due to age.  - Need for shingles vaccine.  - Need for pneumonia vaccine.  - Need for lab work as ordered.   - Encouraged patient to find her lost hearing aid and care for them as advised. -   Reviewed importance of physical activity and regular exercise to improve overall wellbeing, mental health, intellectual health, and preserve overall physical conditioning as we age.   - Discussed prudent health habits, especially given history of cancer, such as consuming a high antioxidant, high protein diet consiting of food rich in nutrition, and engaging in formal exercise.   - Patient will return for OV in the future to review lab results PRN, otherwise for chronic f/up as scheduled.   Lab Orders     Hepatitis C Antibody     HIV antibody (with reflex)  I have personally reviewed and noted the following in the patient's chart:   . Medical and social history . Use of alcohol, tobacco or illicit drugs  . Current medications and supplements . Functional ability and status . Nutritional status . Physical activity . Advanced directives . List of other physicians . Hospitalizations, surgeries, and ER visits in previous 12 months . Vitals . Screenings to include cognitive, depression, and falls . Referrals and appointments  In addition, I have reviewed and discussed with patient certain preventive protocols, quality metrics, and best practice recommendations. A written personalized care plan for preventive services as well as general preventive health recommendations were provided to patient.      Mellody Dance, DO  06/28/2019

## 2019-06-29 LAB — HEMOGLOBIN A1C
Est. average glucose Bld gHb Est-mCnc: 108 mg/dL
Hgb A1c MFr Bld: 5.4 % (ref 4.8–5.6)

## 2019-06-29 LAB — CBC WITH DIFFERENTIAL/PLATELET
Basophils Absolute: 0 10*3/uL (ref 0.0–0.2)
Basos: 1 %
EOS (ABSOLUTE): 0.1 10*3/uL (ref 0.0–0.4)
Eos: 2 %
Hematocrit: 35.8 % (ref 34.0–46.6)
Hemoglobin: 12.1 g/dL (ref 11.1–15.9)
Immature Grans (Abs): 0 10*3/uL (ref 0.0–0.1)
Immature Granulocytes: 0 %
Lymphocytes Absolute: 1.2 10*3/uL (ref 0.7–3.1)
Lymphs: 31 %
MCH: 30.3 pg (ref 26.6–33.0)
MCHC: 33.8 g/dL (ref 31.5–35.7)
MCV: 90 fL (ref 79–97)
Monocytes Absolute: 0.3 10*3/uL (ref 0.1–0.9)
Monocytes: 9 %
Neutrophils Absolute: 2.2 10*3/uL (ref 1.4–7.0)
Neutrophils: 57 %
Platelets: 168 10*3/uL (ref 150–450)
RBC: 4 x10E6/uL (ref 3.77–5.28)
RDW: 12.3 % (ref 11.7–15.4)
WBC: 3.9 10*3/uL (ref 3.4–10.8)

## 2019-06-29 LAB — LIPID PANEL
Chol/HDL Ratio: 2.2 ratio (ref 0.0–4.4)
Cholesterol, Total: 224 mg/dL — ABNORMAL HIGH (ref 100–199)
HDL: 103 mg/dL (ref >39)
LDL Chol Calc (NIH): 105 mg/dL — ABNORMAL HIGH (ref 0–99)
Triglycerides: 95 mg/dL (ref 0–149)
VLDL Cholesterol Cal: 16 mg/dL (ref 5–40)

## 2019-06-29 LAB — COMPREHENSIVE METABOLIC PANEL
ALT: 16 IU/L (ref 0–32)
AST: 32 IU/L (ref 0–40)
Albumin/Globulin Ratio: 2 (ref 1.2–2.2)
Albumin: 4.4 g/dL (ref 3.8–4.8)
Alkaline Phosphatase: 57 IU/L (ref 39–117)
BUN/Creatinine Ratio: 17 (ref 12–28)
BUN: 15 mg/dL (ref 8–27)
Bilirubin Total: 0.3 mg/dL (ref 0.0–1.2)
CO2: 27 mmol/L (ref 20–29)
Calcium: 9.7 mg/dL (ref 8.7–10.3)
Chloride: 101 mmol/L (ref 96–106)
Creatinine, Ser: 0.86 mg/dL (ref 0.57–1.00)
GFR calc Af Amer: 80 mL/min/{1.73_m2} (ref >59)
GFR calc non Af Amer: 70 mL/min/{1.73_m2} (ref >59)
Globulin, Total: 2.2 g/dL (ref 1.5–4.5)
Glucose: 81 mg/dL (ref 65–99)
Potassium: 4.2 mmol/L (ref 3.5–5.2)
Sodium: 141 mmol/L (ref 134–144)
Total Protein: 6.6 g/dL (ref 6.0–8.5)

## 2019-06-29 LAB — VITAMIN D 25 HYDROXY (VIT D DEFICIENCY, FRACTURES): Vit D, 25-Hydroxy: 44.3 ng/mL (ref 30.0–100.0)

## 2019-06-29 LAB — HEPATITIS C ANTIBODY: Hep C Virus Ab: 0.1 s/co ratio (ref 0.0–0.9)

## 2019-06-29 LAB — T4, FREE: Free T4: 0.9 ng/dL (ref 0.82–1.77)

## 2019-06-29 LAB — TSH: TSH: 2.24 u[IU]/mL (ref 0.450–4.500)

## 2019-06-29 LAB — T3: T3, Total: 89 ng/dL (ref 71–180)

## 2019-06-29 LAB — HIV ANTIBODY (ROUTINE TESTING W REFLEX): HIV Screen 4th Generation wRfx: NONREACTIVE

## 2019-07-18 ENCOUNTER — Other Ambulatory Visit: Payer: Self-pay | Admitting: Rheumatology

## 2019-07-18 NOTE — Telephone Encounter (Signed)
Last Visit: 02/22/19 Next Visit: 09/05/19  Okay to refill per Dr. Deveshwar  

## 2019-07-20 NOTE — Progress Notes (Deleted)
Office Visit Note  Patient: [name]             Date of Birth: 07/09/1951           MRN: [id number]             PCP: Mellody Dance, DO Referring: Mellody Dance, DO Visit Date: 07/24/2019 Occupation: @GUAROCC @  Subjective:  No chief complaint on file.   History of Present Illness: [name] is a 68 y.o. female ***   Activities of Daily Living:  Patient reports morning stiffness for *** {minute/hour:19697}.   Patient {ACTIONS;DENIES/REPORTS:21021675::"Denies"} nocturnal pain.  Difficulty dressing/grooming: {ACTIONS;DENIES/REPORTS:21021675::"Denies"} Difficulty climbing stairs: {ACTIONS;DENIES/REPORTS:21021675::"Denies"} Difficulty getting out of chair: {ACTIONS;DENIES/REPORTS:21021675::"Denies"} Difficulty using hands for taps, buttons, cutlery, and/or writing: {ACTIONS;DENIES/REPORTS:21021675::"Denies"}  No Rheumatology ROS completed.   PMFS History:  Patient Active Problem List   Diagnosis Date Noted  . Estrogen deficiency 06/28/2019  . Post-menopausal 06/28/2019  . Mood disorder ([place]) 03/17/2018  . Osteopenia after menopause 11/06/2017  . Medial epicondylitis of right elbow 10/14/2017  . MDD (major depressive disorder), severe ([place]) 09/22/2017  . MDD (major depressive disorder), recurrent episode, moderate ([place])   . Prolonged Q-T interval on ECG 09/20/2017  . Overdose 09/20/2017  . Acute cystitis with hematuria   . Suicide attempt ([place])   . Family history of diabetes mellitus- mom in 50's 08/18/2017    Class: Chronic  . Family history of colon cancer- sister died 62yo 18-Aug-2017  . Adjustment disorder with mixed anxiety < depressed mood August 18, 2017  . B12 deficiency 18-Aug-2017  . Fibromyalgia syndrome 11/23/2016  . Other insomnia 11/23/2016  . Trapezius muscle spasm 11/23/2016  . DDD (degenerative disc disease), cervical 11/23/2016  . DDD (degenerative disc disease), lumbar 11/23/2016  . Other idiopathic scoliosis, lumbar region 11/23/2016   . Malignant neoplasm of female breast ([place]) 11/23/2016  . History of gastroesophageal reflux (GERD) 11/23/2016  . PCP NOTES >>>>>>>>>>>>>>>>>>>> 11/21/2015  . Constipation 02/15/2015  . Annual physical exam 11/20/2013  . COPD mixed type ([place]) 11/20/2013  . Unspecified adverse effect of unspecified drug, medicinal and biological substance 12/10/2011  . Vitamin D deficiency 07/06/2011  . CARDIOMYOPATHY, PRIMARY, DILATED 01/01/2009  . BREAST CANCER, HX OF 11/02/2008  . h/o HYPERLIPIDEMIA 10/18/2007  . GERD 10/18/2007  . ANEMIA, B12 DEFICIENCY 10/12/2007  . Anxiety,depression, insomnia 10/12/2007  . IBS 10/12/2007  . Primary osteoarthritis of both hands 10/12/2007  . Osteoporosis 10/12/2007  . COLONIC POLYPS, HX OF 10/12/2007  . MIGRAINES, HX OF 10/12/2007  . Fibromyalgia--Dr. Herold Harms, on Ultram 07/14/2007    Past Medical History:  Diagnosis Date  . Anemia    B12 deficient  . B12 deficiency   . Breast cancer ([place])    left breast.  . Chronic SI joint pain    Bilateral, Dr. Estanislado Pandy  . Fibromyalgia    Dr. Estanislado Pandy  . GERD (gastroesophageal reflux disease)   . Greater trochanteric bursitis of both hips    Dr. Estanislado Pandy  . Hyperlipidemia   . IBS (irritable bowel syndrome)   . LV dysfunction    iatrogenic from chemotherapy ; on Carvedilol   . Migraines   . Osteoporosis    Dr Lomax---> transferring to a new gyn    Family History  Problem Relation Age of Onset  . Diabetes Mother   . Breast cancer Mother   . Depression Mother        anxiety  . Heart disease Mother        in her 70s  . Colon  cancer Sister   . Healthy Daughter   . Stroke Neg Hx    Past Surgical History:  Procedure Laterality Date  . ABDOMINAL HYSTERECTOMY     BSO for  Endometriosis  . CARPAL TUNNEL RELEASE     left  . CERVICAL DISC SURGERY    . COLONOSCOPY W/ POLYPECTOMY  1999   Dr Earlean Shawl  . ESOPHAGEAL DILATION  2005   Dr Earlean Shawl  . MASTECTOMY  2009   bilateral, Dr Margot Chimes  . SEPTOPLASTY      Social History   Social History Narrative   Lives w/ husband   Immunization History  Administered Date(s) Administered  . Influenza Whole 07/14/2007, 05/31/2012  . Influenza, High Dose Seasonal PF 07/18/2018  . Td 04/01/1998  . Tdap 10/29/2011     Objective: Vital Signs: There were no vitals taken for this visit.   Physical Exam   Musculoskeletal Exam: ***  CDAI Exam: CDAI Score: - Patient Global: -; Provider Global: - Swollen: -; Tender: - Joint Exam   No joint exam has been documented for this visit   There is currently no information documented on the homunculus. Go to the Rheumatology activity and complete the homunculus joint exam.  Investigation: No additional findings.  Imaging: No results found.  Recent Labs: Lab Results  Component Value Date   WBC 3.9 06/28/2019   HGB 12.1 06/28/2019   PLT 168 06/28/2019   NA 141 06/28/2019   K 4.2 06/28/2019   CL 101 06/28/2019   CO2 27 06/28/2019   GLUCOSE 81 06/28/2019   BUN 15 06/28/2019   CREATININE 0.86 06/28/2019   BILITOT 0.3 06/28/2019   ALKPHOS 57 06/28/2019   AST 32 06/28/2019   ALT 16 06/28/2019   PROT 6.6 06/28/2019   ALBUMIN 4.4 06/28/2019   CALCIUM 9.7 06/28/2019   GFRAA 80 06/28/2019    Speciality Comments: Narcotic Agreement Broken- [place] found on last UDS.   Procedures:  No procedures performed Allergies: Amoxicillin-pot clavulanate, Doxycycline, and Venlafaxine   Assessment / Plan:     Visit Diagnoses: Fibromyalgia  Trapezius muscle spasm  DDD (degenerative disc disease), cervical  DDD (degenerative disc disease), lumbar  Lateral epicondylitis of right elbow  Trochanteric bursitis of right hip  Primary osteoarthritis of both hands  Age-related osteoporosis without current pathological fracture  History of scoliosis  Other insomnia  History of IBS  History of migraine  History of hyperlipidemia  History of vitamin D deficiency  History of anxiety  History  of gastroesophageal reflux (GERD)  History of COPD  History of breast cancer  Orders: No orders of the defined types were placed in this encounter.  No orders of the defined types were placed in this encounter.   Face-to-face time spent with patient was *** minutes. Greater than 50% of time was spent in counseling and coordination of care.  Follow-Up Instructions: No follow-ups on file.   Ofilia Neas, PA-C  Note - This record has been created using Dragon software.  Chart creation errors have been sought, but may not always  have been located. Such creation errors do not reflect on  the standard of medical care.

## 2019-07-24 ENCOUNTER — Ambulatory Visit: Payer: Medicare Other | Admitting: Rheumatology

## 2019-08-28 ENCOUNTER — Other Ambulatory Visit: Payer: Self-pay | Admitting: Rheumatology

## 2019-08-28 NOTE — Telephone Encounter (Signed)
Last Visit: 02/22/2019 Next Visit:09/05/2019  Okay to refill per Dr. Estanislado Pandy.

## 2019-09-04 NOTE — Progress Notes (Signed)
Office Visit Note  Patient: [name]             Date of Birth: 1950-12-09           MRN: [id number]             PCP: Mellody Dance, DO Referring: Mellody Dance, DO Visit Date: 09/05/2019 Occupation: @GUAROCC @  Subjective:  Trapezius muscle spasms   History of Present Illness: [name] is a 69 y.o. female with history of fibromyalgia, osteoarthritis, and DDD.  She presents today with trapezius muscle tension and tenderness. She has been having more frequent muscle spasms.  She would like trigger point injections bilaterally today.  She continues to have chronic lower back pain.  She has lateral epicondylitis bilaterally, worse on the right side.  She has been experiencing increased pain in both knee joints and bilateral trochanteric bursitis.    Activities of Daily Living:  Patient reports morning stiffness for 1 hour.   Patient Denies nocturnal pain.  Difficulty dressing/grooming: Denies Difficulty climbing stairs: Denies Difficulty getting out of chair: Denies Difficulty using hands for taps, buttons, cutlery, and/or writing: Reports  Review of Systems  Constitutional: Positive for fatigue.  HENT: Positive for mouth dryness. Negative for mouth sores and nose dryness.   Eyes: Negative for pain, visual disturbance and dryness.  Respiratory: Negative for cough, hemoptysis, shortness of breath and difficulty breathing.   Cardiovascular: Negative for chest pain, palpitations and hypertension.  Gastrointestinal: Positive for constipation. Negative for blood in stool and diarrhea.  Endocrine: Negative for increased urination.  Genitourinary: Negative for painful urination.  Musculoskeletal: Positive for arthralgias, joint pain, joint swelling, morning stiffness and muscle tenderness. Negative for myalgias, muscle weakness and myalgias.  Skin: Negative for color change, pallor, rash, hair loss, nodules/bumps, skin tightness, ulcers and sensitivity to sunlight.    Allergic/Immunologic: Negative for susceptible to infections.  Neurological: Negative for dizziness, numbness and headaches.  Hematological: Negative for swollen glands.  Psychiatric/Behavioral: Positive for sleep disturbance. Negative for depressed mood. The patient is not nervous/anxious.     PMFS History:  Patient Active Problem List   Diagnosis Date Noted  . Estrogen deficiency 06/28/2019  . Post-menopausal 06/28/2019  . Mood disorder ([place]) 03/17/2018  . Osteopenia after menopause 11/06/2017  . Medial epicondylitis of right elbow 10/14/2017  . MDD (major depressive disorder), severe ([place]) 09/22/2017  . MDD (major depressive disorder), recurrent episode, moderate ([place])   . Prolonged Q-T interval on ECG 09/20/2017  . Overdose 09/20/2017  . Acute cystitis with hematuria   . Suicide attempt ([place])   . Family history of diabetes mellitus- mom in 50's Sep 02, 2017    Class: Chronic  . Family history of colon cancer- sister died 70yo 09/02/17  . Adjustment disorder with mixed anxiety < depressed mood 2017-09-02  . B12 deficiency 09/02/17  . Fibromyalgia syndrome 11/23/2016  . Other insomnia 11/23/2016  . Trapezius muscle spasm 11/23/2016  . DDD (degenerative disc disease), cervical 11/23/2016  . DDD (degenerative disc disease), lumbar 11/23/2016  . Other idiopathic scoliosis, lumbar region 11/23/2016  . Malignant neoplasm of female breast ([place]) 11/23/2016  . History of gastroesophageal reflux (GERD) 11/23/2016  . PCP NOTES >>>>>>>>>>>>>>>>>>>> 11/21/2015  . Constipation 02/15/2015  . Annual physical exam 11/20/2013  . COPD mixed type ([place]) 11/20/2013  . Unspecified adverse effect of unspecified drug, medicinal and biological substance 12/10/2011  . Vitamin D deficiency 07/06/2011  . CARDIOMYOPATHY, PRIMARY, DILATED 01/01/2009  . BREAST CANCER, HX OF 11/02/2008  . h/o HYPERLIPIDEMIA  10/18/2007  . GERD 10/18/2007  . ANEMIA, B12 DEFICIENCY 10/12/2007  . Anxiety,depression,  insomnia 10/12/2007  . IBS 10/12/2007  . Primary osteoarthritis of both hands 10/12/2007  . Osteoporosis 10/12/2007  . COLONIC POLYPS, HX OF 10/12/2007  . MIGRAINES, HX OF 10/12/2007  . Fibromyalgia--Dr. Herold Harms, on Ultram 07/14/2007    Past Medical History:  Diagnosis Date  . Anemia    B12 deficient  . B12 deficiency   . Breast cancer ([place])    left breast.  . Chronic SI joint pain    Bilateral, Dr. Estanislado Pandy  . Fibromyalgia    Dr. Estanislado Pandy  . GERD (gastroesophageal reflux disease)   . Greater trochanteric bursitis of both hips    Dr. Estanislado Pandy  . Hyperlipidemia   . IBS (irritable bowel syndrome)   . LV dysfunction    iatrogenic from chemotherapy ; on Carvedilol   . Migraines   . Osteoporosis    Dr Lomax---> transferring to a new gyn    Family History  Problem Relation Age of Onset  . Diabetes Mother   . Breast cancer Mother   . Depression Mother        anxiety  . Heart disease Mother        in her 64s  . Colon cancer Sister   . Healthy Daughter   . Stroke Neg Hx    Past Surgical History:  Procedure Laterality Date  . ABDOMINAL HYSTERECTOMY     BSO for  Endometriosis  . CARPAL TUNNEL RELEASE     left  . CERVICAL DISC SURGERY    . COLONOSCOPY W/ POLYPECTOMY  1999   Dr Earlean Shawl  . ESOPHAGEAL DILATION  2005   Dr Earlean Shawl  . MASTECTOMY  2009   bilateral, Dr Margot Chimes  . SEPTOPLASTY     Social History   Social History Narrative   Lives w/ husband   Immunization History  Administered Date(s) Administered  . Influenza Whole 07/14/2007, 05/31/2012  . Influenza, High Dose Seasonal PF 07/18/2018  . Td 04/01/1998  . Tdap 10/29/2011     Objective: Vital Signs: BP (!) 100/52 (BP Location: Right Arm, Patient Position: Sitting, Cuff Size: Normal)   Pulse 77   Resp 16   Ht 5' (1.524 m)   Wt 88 lb (39.9 kg)   BMI 17.19 kg/m    Physical Exam Vitals and nursing note reviewed.  Constitutional:      Appearance: She is well-developed.  HENT:     Head:  Normocephalic and atraumatic.  Eyes:     Conjunctiva/sclera: Conjunctivae normal.  Cardiovascular:     Rate and Rhythm: Normal rate and regular rhythm.     Heart sounds: Normal heart sounds.  Pulmonary:     Effort: Pulmonary effort is normal.     Breath sounds: Normal breath sounds.  Abdominal:     General: Bowel sounds are normal.     Palpations: Abdomen is soft.  Musculoskeletal:     Cervical back: Normal range of motion.  Lymphadenopathy:     Cervical: No cervical adenopathy.  Skin:    General: Skin is warm and dry.     Capillary Refill: Capillary refill takes less than 2 seconds.  Neurological:     Mental Status: She is alert and oriented to person, place, and time.  Psychiatric:        Behavior: Behavior normal.      Musculoskeletal Exam: C-spine limited ROM.  Thoracic and lumbar spine good ROM.  Shoulder joints, elbow joints, wrist joints,  MCPs, PIPs, and DIPs good ROM with no synovitis. Complete fist formation bilaterally.  Hip joints, knee joints, ankle joints, MTPs, PIPs, and DIPs good ROM with no synovitis.  No warmth or effusion of knee joints.  Tenderness over bilateral trochanteric bursa.  Ankle joints good ROM with no tenderness or inflammation.    CDAI Exam: CDAI Score: -- Patient Global: --; Provider Global: -- Swollen: --; Tender: -- Joint Exam 09/05/2019   No joint exam has been documented for this visit   There is currently no information documented on the homunculus. Go to the Rheumatology activity and complete the homunculus joint exam.  Investigation: No additional findings.  Imaging: No results found.  Recent Labs: Lab Results  Component Value Date   WBC 3.9 06/28/2019   HGB 12.1 06/28/2019   PLT 168 06/28/2019   NA 141 06/28/2019   K 4.2 06/28/2019   CL 101 06/28/2019   CO2 27 06/28/2019   GLUCOSE 81 06/28/2019   BUN 15 06/28/2019   CREATININE 0.86 06/28/2019   BILITOT 0.3 06/28/2019   ALKPHOS 57 06/28/2019   AST 32 06/28/2019   ALT  16 06/28/2019   PROT 6.6 06/28/2019   ALBUMIN 4.4 06/28/2019   CALCIUM 9.7 06/28/2019   GFRAA 80 06/28/2019    Speciality Comments: Narcotic Agreement Broken- [place] found on last UDS.   Procedures:  Trigger Point Inj  Date/Time: 09/05/2019 11:49 AM Performed by: Bo Merino, MD Authorized by: Bo Merino, MD   Consent Given by:  Patient Site marked: the procedure site was marked   Timeout: prior to procedure the correct patient, procedure, and site was verified   Indications:  Pain Total # of Trigger Points:  2 Location: neck   Needle Size:  27 G Approach:  Dorsal Medications #1:  0.5 mL lidocaine 1 %; 10 mg triamcinolone acetonide 40 MG/ML Medications #2:  0.5 mL lidocaine 1 %; 10 mg triamcinolone acetonide 40 MG/ML Patient tolerance:  Patient tolerated the procedure well with no immediate complications   Allergies: Amoxicillin-pot clavulanate, Doxycycline, and Venlafaxine   Assessment / Plan:     Visit Diagnoses: Fibromyalgia: She has generalized muscle aches and muscle tenderness due to fibromyalgia.  She has positive tender points and generalized hyperalgesia on exam.  She has been experiencing intermittent trapezius muscle spasms and tenderness bilaterally. She requested trigger point injections bilaterally.  She tolerated the procedure well.  Procedure note completed above.  She continues to take robaxin 750 mg BID PRN for muscle spasms.  She does not need a refill at this time. She was encouraged to perform exercises on a regular basis and to practice good sleep hygiene. She will follow up in 6 months.   Trapezius muscle spasm: She has trapezius muscle tension and tenderness bilaterally. She has been having frequent trapezius muscle spasms.  She requested trapezius trigger point injections today.  She tolerated the procedure well.  Procedure notes completed above.   DDD (degenerative disc disease), cervical: She has limited ROM of the C-spine on exam.  She  has been experiencing trapezius muscle tension and spasms intermittently.  Trigger point injections performed today. She was provided a handout of neck exercises to perform.   DDD (degenerative disc disease), lumbar: She has chronic lower back pain. She has no symptoms of radiculopathy at this time.   Lateral epicondylitis of both elbows: She has tenderness bilaterally.  She was encouraged to perform stretching exercises.    Trochanteric bursitis of both hips: She has tenderness over bilateral trochanteric  bursa on exam today.  She was encouraged to perform stretching exercises daily. She was provided a handout of exercises to perform.   Primary osteoarthritis of both hands: She has no hand pain or joint swelling currently.  Joint protection and muscle strengthening were discussed.   Age-related osteoporosis without current pathological fracture: PCP orders DEXA.   Other medical conditions are listed as follows:   History of scoliosis  Other insomnia  History of IBS  History of migraine  History of hyperlipidemia  History of vitamin D deficiency  History of depression  History of anxiety  History of gastroesophageal reflux (GERD)  History of COPD  History of breast cancer  Orders: Orders Placed This Encounter  Procedures  . Trigger Point Inj   No orders of the defined types were placed in this encounter.   Face-to-face time spent with patient was 25 minutes. Greater than 50% of time was spent in counseling and coordination of care.  Follow-Up Instructions: Return in about 6 months (around 03/04/2020) for Fibromyalgia, Osteoarthritis.   Bo Merino, MD   Scribed by-  Hazel Sams, PA-C  Note - This record has been created using Dragon software.  Chart creation errors have been sought, but may not always  have been located. Such creation errors do not reflect on  the standard of medical care.

## 2019-09-05 ENCOUNTER — Ambulatory Visit: Payer: Medicare Other | Admitting: Rheumatology

## 2019-09-05 ENCOUNTER — Other Ambulatory Visit: Payer: Self-pay

## 2019-09-05 ENCOUNTER — Encounter: Payer: Self-pay | Admitting: Rheumatology

## 2019-09-05 VITALS — BP 100/52 | HR 77 | Resp 16 | Ht 60.0 in | Wt 88.0 lb

## 2019-09-05 DIAGNOSIS — M5030 Other cervical disc degeneration, unspecified cervical region: Secondary | ICD-10-CM | POA: Diagnosis not present

## 2019-09-05 DIAGNOSIS — M7711 Lateral epicondylitis, right elbow: Secondary | ICD-10-CM

## 2019-09-05 DIAGNOSIS — Z8639 Personal history of other endocrine, nutritional and metabolic disease: Secondary | ICD-10-CM

## 2019-09-05 DIAGNOSIS — M19041 Primary osteoarthritis, right hand: Secondary | ICD-10-CM

## 2019-09-05 DIAGNOSIS — M797 Fibromyalgia: Secondary | ICD-10-CM | POA: Diagnosis not present

## 2019-09-05 DIAGNOSIS — M7712 Lateral epicondylitis, left elbow: Secondary | ICD-10-CM

## 2019-09-05 DIAGNOSIS — Z8709 Personal history of other diseases of the respiratory system: Secondary | ICD-10-CM

## 2019-09-05 DIAGNOSIS — M62838 Other muscle spasm: Secondary | ICD-10-CM

## 2019-09-05 DIAGNOSIS — M5136 Other intervertebral disc degeneration, lumbar region: Secondary | ICD-10-CM | POA: Diagnosis not present

## 2019-09-05 DIAGNOSIS — Z853 Personal history of malignant neoplasm of breast: Secondary | ICD-10-CM

## 2019-09-05 DIAGNOSIS — Z8659 Personal history of other mental and behavioral disorders: Secondary | ICD-10-CM

## 2019-09-05 DIAGNOSIS — M810 Age-related osteoporosis without current pathological fracture: Secondary | ICD-10-CM

## 2019-09-05 DIAGNOSIS — G4709 Other insomnia: Secondary | ICD-10-CM

## 2019-09-05 DIAGNOSIS — M19042 Primary osteoarthritis, left hand: Secondary | ICD-10-CM

## 2019-09-05 DIAGNOSIS — Z8739 Personal history of other diseases of the musculoskeletal system and connective tissue: Secondary | ICD-10-CM

## 2019-09-05 DIAGNOSIS — Z8719 Personal history of other diseases of the digestive system: Secondary | ICD-10-CM

## 2019-09-05 DIAGNOSIS — M7062 Trochanteric bursitis, left hip: Secondary | ICD-10-CM

## 2019-09-05 DIAGNOSIS — Z8669 Personal history of other diseases of the nervous system and sense organs: Secondary | ICD-10-CM

## 2019-09-05 DIAGNOSIS — M7061 Trochanteric bursitis, right hip: Secondary | ICD-10-CM

## 2019-09-05 DIAGNOSIS — M51369 Other intervertebral disc degeneration, lumbar region without mention of lumbar back pain or lower extremity pain: Secondary | ICD-10-CM

## 2019-09-05 NOTE — Patient Instructions (Signed)
Neck Exercises Ask your health care provider which exercises are safe for you. Do exercises exactly as told by your health care provider and adjust them as directed. It is normal to feel mild stretching, pulling, tightness, or discomfort as you do these exercises. Stop right away if you feel sudden pain or your pain gets worse. Do not begin these exercises until told by your health care provider. Neck exercises can be important for many reasons. They can improve strength and maintain flexibility in your neck, which will help your upper back and prevent neck pain. Stretching exercises Rotation neck stretching  1. Sit in a chair or stand up. 2. Place your feet flat on the floor, shoulder width apart. 3. Slowly turn your head (rotate) to the right until a slight stretch is felt. Turn it all the way to the right so you can look over your right shoulder. Do not tilt or tip your head. 4. Hold this position for 10-30 seconds. 5. Slowly turn your head (rotate) to the left until a slight stretch is felt. Turn it all the way to the left so you can look over your left shoulder. Do not tilt or tip your head. 6. Hold this position for 10-30 seconds. Repeat __________ times. Complete this exercise __________ times a day. Neck retraction 1. Sit in a sturdy chair or stand up. 2. Look straight ahead. Do not bend your neck. 3. Use your fingers to push your chin backward (retraction). Do not bend your neck for this movement. Continue to face straight ahead. If you are doing the exercise properly, you will feel a slight sensation in your throat and a stretch at the back of your neck. 4. Hold the stretch for 1-2 seconds. Repeat __________ times. Complete this exercise __________ times a day. Strengthening exercises Neck press 1. Lie on your back on a firm bed or on the floor with a pillow under your head. 2. Use your neck muscles to push your head down on the pillow and straighten your spine. 3. Hold the position  as well as you can. Keep your head facing up (in a neutral position) and your chin tucked. 4. Slowly count to 5 while holding this position. Repeat __________ times. Complete this exercise __________ times a day. Isometrics These are exercises in which you strengthen the muscles in your neck while keeping your neck still (isometrics). 1. Sit in a supportive chair and place your hand on your forehead. 2. Keep your head and face facing straight ahead. Do not flex or extend your neck while doing isometrics. 3. Push forward with your head and neck while pushing back with your hand. Hold for 10 seconds. 4. Do the sequence again, this time putting your hand against the back of your head. Use your head and neck to push backward against the hand pressure. 5. Finally, do the same exercise on either side of your head, pushing sideways against the pressure of your hand. Repeat __________ times. Complete this exercise __________ times a day. Prone head lifts 1. Lie face-down (prone position), resting on your elbows so that your chest and upper back are raised. 2. Start with your head facing downward, near your chest. Position your chin either on or near your chest. 3. Slowly lift your head upward. Lift until you are looking straight ahead. Then continue lifting your head as far back as you can comfortably stretch. 4. Hold your head up for 5 seconds. Then slowly lower it to your starting position. Repeat __________   times. Complete this exercise __________ times a day. Supine head lifts 1. Lie on your back (supine position), bending your knees to point to the ceiling and keeping your feet flat on the floor. 2. Lift your head slowly off the floor, raising your chin toward your chest. 3. Hold for 5 seconds. Repeat __________ times. Complete this exercise __________ times a day. Scapular retraction 1. Stand with your arms at your sides. Look straight ahead. 2. Slowly pull both shoulders (scapulae) backward  and downward (retraction) until you feel a stretch between your shoulder blades in your upper back. 3. Hold for 10-30 seconds. 4. Relax and repeat. Repeat __________ times. Complete this exercise __________ times a day. Contact a health care provider if:  Your neck pain or discomfort gets much worse when you do an exercise.  Your neck pain or discomfort does not improve within 2 hours after you exercise. If you have any of these problems, stop exercising right away. Do not do the exercises again unless your health care provider says that you can. Get help right away if:  You develop sudden, severe neck pain. If this happens, stop exercising right away. Do not do the exercises again unless your health care provider says that you can. This information is not intended to replace advice given to you by your health care provider. Make sure you discuss any questions you have with your health care provider. Document Revised: 06/15/2018 Document Reviewed: 06/15/2018 Elsevier Patient Education  2020 [place]. Hip Bursitis Rehab Ask your health care provider which exercises are safe for you. Do exercises exactly as told by your health care provider and adjust them as directed. It is normal to feel mild stretching, pulling, tightness, or discomfort as you do these exercises. Stop right away if you feel sudden pain or your pain gets worse. Do not begin these exercises until told by your health care provider. Stretching exercise This exercise warms up your muscles and joints and improves the movement and flexibility of your hip. This exercise also helps to relieve pain and stiffness. Iliotibial band stretch An iliotibial band is a strong band of muscle tissue that runs from the outer side of your hip to the outer side of your thigh and knee. 1. Lie on your side with your left / right leg in the top position. 2. Bend your left / right knee and grab your ankle. Stretch out your bottom arm to help you  balance. 3. Slowly bring your knee back so your thigh is behind your body. 4. Slowly lower your knee toward the floor until you feel a gentle stretch on the outside of your left / right thigh. If you do not feel a stretch and your knee will not fall farther, place the heel of your other foot on top of your knee and pull your knee down toward the floor with your foot. 5. Hold this position for __________ seconds. 6. Slowly return to the starting position. Repeat __________ times. Complete this exercise __________ times a day. Strengthening exercises These exercises build strength and endurance in your hip and pelvis. Endurance is the ability to use your muscles for a long time, even after they get tired. Bridge This exercise strengthens the muscles that move your thigh backward (hip extensors). 1. Lie on your back on a firm surface with your knees bent and your feet flat on the floor. 2. Tighten your buttocks muscles and lift your buttocks off the floor until your trunk is level with your  thighs. ? Do not arch your back. ? You should feel the muscles working in your buttocks and the back of your thighs. If you do not feel these muscles, slide your feet 1-2 inches (2.5-5 cm) farther away from your buttocks. ? If this exercise is too easy, try doing it with your arms crossed over your chest. 3. Hold this position for __________ seconds. 4. Slowly lower your hips to the starting position. 5. Let your muscles relax completely after each repetition. Repeat __________ times. Complete this exercise __________ times a day. Squats This exercise strengthens the muscles in front of your thigh and knee (quadriceps). 1. Stand in front of a table, with your feet and knees pointing straight ahead. You may rest your hands on the table for balance but not for support. 2. Slowly bend your knees and lower your hips like you are going to sit in a chair. ? Keep your weight over your heels, not over your  toes. ? Keep your lower legs upright so they are parallel with the table legs. ? Do not let your hips go lower than your knees. ? Do not bend lower than told by your health care provider. ? If your hip pain increases, do not bend as low. 3. Hold the squat position for __________ seconds. 4. Slowly push with your legs to return to standing. Do not use your hands to pull yourself to standing. Repeat __________ times. Complete this exercise __________ times a day. Hip hike 1. Stand sideways on a bottom step. Stand on your left / right leg with your other foot unsupported next to the step. You can hold on to the railing or wall for balance if needed. 2. Keep your knees straight and your torso square. Then lift your left / right hip up toward the ceiling. 3. Hold this position for __________ seconds. 4. Slowly let your left / right hip lower toward the floor, past the starting position. Your foot should get closer to the floor. Do not lean or bend your knees. Repeat __________ times. Complete this exercise __________ times a day. Single leg stand 1. Without shoes, stand near a railing or in a doorway. You may hold on to the railing or door frame as needed for balance. 2. Squeeze your left / right buttock muscles, then lift up your other foot. ? Do not let your left / right hip push out to the side. ? It is helpful to stand in front of a mirror for this exercise so you can watch your hip. 3. Hold this position for __________ seconds. Repeat __________ times. Complete this exercise __________ times a day. This information is not intended to replace advice given to you by your health care provider. Make sure you discuss any questions you have with your health care provider. Document Revised: 12/12/2018 Document Reviewed: 12/12/2018 Elsevier Patient Education  [place].

## 2019-09-12 ENCOUNTER — Other Ambulatory Visit: Payer: Self-pay

## 2019-09-12 ENCOUNTER — Encounter: Payer: Self-pay | Admitting: Family Medicine

## 2019-09-12 ENCOUNTER — Telehealth: Payer: Self-pay | Admitting: Family Medicine

## 2019-09-12 ENCOUNTER — Ambulatory Visit (INDEPENDENT_AMBULATORY_CARE_PROVIDER_SITE_OTHER): Payer: Medicare Other | Admitting: Family Medicine

## 2019-09-12 VITALS — BP 119/64 | Temp 98.4°F | Ht 60.0 in | Wt 87.5 lb

## 2019-09-12 DIAGNOSIS — Z8619 Personal history of other infectious and parasitic diseases: Secondary | ICD-10-CM

## 2019-09-12 DIAGNOSIS — F322 Major depressive disorder, single episode, severe without psychotic features: Secondary | ICD-10-CM

## 2019-09-12 DIAGNOSIS — K1379 Other lesions of oral mucosa: Secondary | ICD-10-CM

## 2019-09-12 MED ORDER — CITALOPRAM HYDROBROMIDE 40 MG PO TABS: ORAL_TABLET | ORAL | 0 refills | Status: DC

## 2019-09-12 MED ORDER — NYSTATIN 100000 UNIT/ML MT SUSP: 500000.0000 [IU] | Freq: Four times a day (QID) | OROMUCOSAL | 0 refills | Status: DC

## 2019-09-12 NOTE — Telephone Encounter (Signed)
Patient called states she has developed a case of the THRUSH on her tongue due to inhaler usage---  Dr. Sharyn Creamer had no available appts ( Pt placed on [facility] scheduled with understanding that if a opening occurs for (Dr. Jenetta Downer ) we will switch her over to her PCP's scheduled.  Note to both CMAs  --glh

## 2019-09-12 NOTE — Progress Notes (Signed)
Virtual / live video office visit note for [employer], D.O- Primary Care Physician at [facility]   I connected with current patient today and beyond visually recognizing the correct individual, I verified that I am speaking with the correct person using two identifiers.  . Location of the patient: Home . Location of the provider: Office Only the patient (+/- their family members at pt's discretion) and myself were participating in the encounter    - This visit type was conducted due to national recommendations for restrictions regarding the COVID-19 Pandemic (e.g. social distancing) in an effort to limit this patient's exposure and mitigate transmission in our community.  This format is felt to be most appropriate for this patient at this time.   - The patient did have access to video technology today  - No physical exam could be performed with this format, beyond that communicated to [place] by the patient/ family members as noted.   - Additionally my office staff/ schedulers discussed with the patient that there may be a monetary charge related to this service, depending on patient's medical insurance.   The patient expressed understanding, and agreed to proceed.      History of Present Illness: [name], am serving as scribe for [name].  - Suspected Thrush in Mouth Patient with concerns about thrush in mouth today.  First noticed her symptoms today.  Notes this morning when she got up, she started seeing white spots on her tongue.  "Yesterday, when I ate, it kind of didn't taste good to me, and it always happens when the food doesn't taste good."  States that she has had thrush in the past, and her symptoms today feel similar to those she's experienced in the past.  States the area of concern is "little tiny white spots."  Notes she is looking at her mouth at home in a mirror with intense magnification.  She is afraid that her Trelegy  inhaler led to her symptoms.  Notes the other day, she was using her Trelegy inhaler, and "I didn't swallow when I closed my mouth and my throat back there together, and a few days later, I've got these white spots in my mouth."  Says she always brushes her teeth and her tongue after using her inhaler.  - Depression Continues management on Celexa.  States she follows up with Malachi Paradise PMHNP of [facility].  Her prescription was last filled on July 05, 2019.   She thinks that her depression has been "better, a lot better."    Depression screen [facility] 2/9 09/21/2019 09/12/2019 06/28/2019 04/17/2019 07/18/2018  Decreased Interest 1 0 0 1 1  Down, Depressed, Hopeless 1 0 0 1 1  PHQ - 2 Score 2 0 0 2 2  Altered sleeping 3 1 1 2 2   Tired, decreased energy 3 1 1 3 2   Change in appetite 3 1 0 1 1  Feeling bad or failure about yourself  1 0 0 0 1  Trouble concentrating 0 0 0 0 1  Moving slowly or fidgety/restless 0 0 0 0 1  Suicidal thoughts 0 0 0 0 0  PHQ-9 Score 12 3 2 8 10   Difficult doing work/chores Somewhat difficult Somewhat difficult Not difficult at all Not difficult at all Somewhat difficult  Some recent data might be hidden    GAD 7 : Generalized Anxiety Score 09/21/2019 04/17/2019 07/18/2018  Nervous, Anxious, on Edge 1  0 2  Control/stop worrying 0 1 2  Worry too much - different things 1 1 2   Trouble relaxing 3 3 2   Restless 1 0 2  Easily annoyed or irritable 1 1 2   Afraid - awful might happen 3 0 2  Total GAD 7 Score 10 6 14   Anxiety Difficulty Not difficult at all Not difficult at all Somewhat difficult     Impression and Recommendations:    1. MDD (major depressive disorder), severe ([place])   2. History of thrush   3. Mouth sores-  reoccurring      Mouth sores/ ? [place] patient today regarding signs and symptoms of thrush. - difficult to see ABN in mouth via video today - Discussed that trauma to the gums or irritation of the mouth can lead  to blisters in the mouth. - Avoid acidic food/ drink and citrus etc which may worsen sores - Per patient, would rather rinse with medicine and spit to treat current symptoms. - Nystatin suspension provided to patient today, for use as swish and spit four times daily.  - If symptoms do not improve, patient knows she will need to return for in-person assessment.  - Will continue to monitor.   MDD/ Mood disorder - Per pt Stable at this time. - Continue txmnt plan and management as established by Psych.  See med list. - Patient will continue to follow up with Malachi Paradise PMHNP of [facility] for med mgt. - Will continue to monitor alongside specialist.   Recommendations - Return as already scheduled for f//up next week.   - As part of my medical decision making, I reviewed the following data within the [place] History obtained from pt /family, CMA notes reviewed and incorporated if applicable, Labs reviewed, Radiograph/ tests reviewed if applicable and OV notes from prior OV's with me, as well as other specialists she/he has seen since seeing me last, were all reviewed and used in my medical decision making process today.   - Additionally, discussion had with patient regarding txmnt plan, their biases about that plan etc were used in my medical decision making today.   - The patient agreed with the plan and demonstrated an understanding of the instructions.   No barriers to understanding were identified.     Return for f/up next week as scheduled, additionally in-person if mouth is not better.     Meds ordered this encounter  Medications  . nystatin (MYCOSTATIN) 100000 UNIT/ML suspension    Sig: Take 5 mLs (500,000 Units total) by mouth 4 (four) times daily. Swish for 30 seconds and spit out.    Dispense:  180 mL    Refill:  0  . citalopram (CELEXA) 40 MG tablet    Sig: 1po qd Filled by Malachi Paradise-  Nurse Practitioner, [facility]     Dispense:  5 tablet    Refill:  0    Medications Discontinued During This Encounter  Medication Reason  . citalopram (CELEXA) 40 MG tablet       Note:  This note was prepared with assistance of Dragon voice recognition software. Occasional wrong-word or sound-a-like substitutions may have occurred due to the inherent limitations of voice recognition software.   This document serves as a record of services personally performed by Mellody Dance, DO. It was created on her behalf by Toni Amend, a trained medical scribe. The creation of this record is based on the scribe's personal observations and the provider's statements  to them.   This case required medical decision making of at least moderate complexity. The above documentation has been reviewed to be accurate and was completed by Marjory Sneddon, D.O.       Patient Care Team    Relationship Specialty Notifications Start End  Mellody Dance, DO PCP - General Family Medicine  August 31, 2017   Bo Merino, MD Consulting Physician Rheumatology  10/02/14   West Pugh, NP (Inactive) Nurse Practitioner Gynecology  10/02/14   [place] Campbell, MD Consulting Physician Gastroenterology  August 31, 2017   Druscilla Brownie, MD Consulting Physician Dermatology  31-Aug-2017   Deneise Lever, MD Consulting Physician Pulmonary Disease  05/01/19   [facility]  [facility]  09/12/19    Comment: Malachi Paradise-  Nurse Practitioner, [facility]  Deneise Lever, MD Consulting Physician Pulmonary Disease  09/21/19     -Vitals obtained; medications/ allergies reconciled;  personal medical, social, Sx etc.histories were updated by CMA, reviewed by me and are reflected in chart   Patient Active Problem List   Diagnosis Date Noted  . Mood disorder ([place]) 03/17/2018  . Suicide attempt ([place])   . Family history of colon cancer- sister died 39yo 08/31/2017  . Adjustment disorder with mixed anxiety < depressed mood 08/31/17  . Malignant  neoplasm of female breast ([place]) 11/23/2016  . COPD mixed type ([place]) 11/20/2013  . h/o HYPERLIPIDEMIA 10/18/2007  . History of gastroesophageal reflux (GERD) 11/23/2016  . CARDIOMYOPATHY, PRIMARY, DILATED 01/01/2009  . ANEMIA, B12 DEFICIENCY 10/12/2007  . Anxiety,depression, insomnia 10/12/2007  . IBS 10/12/2007  . Fibromyalgia--Dr. Herold Harms, on Ultram 07/14/2007  . Family history of diabetes mellitus- mom in 50's 2017-08-31  . B12 deficiency 31-Aug-2017  . Other insomnia 11/23/2016  . Vitamin D deficiency 07/06/2011  . Osteoporosis 10/12/2007  . History of thrush 09/22/2019  . Estrogen deficiency 06/28/2019  . Post-menopausal 06/28/2019  . Osteopenia after menopause 11/06/2017  . Medial epicondylitis of right elbow 10/14/2017  . MDD (major depressive disorder), severe ([place]) 09/22/2017  . MDD (major depressive disorder), recurrent episode, moderate ([place])   . Prolonged Q-T interval on ECG 09/20/2017  . Overdose 09/20/2017  . Acute cystitis with hematuria   . Fibromyalgia syndrome 11/23/2016  . Trapezius muscle spasm 11/23/2016  . DDD (degenerative disc disease), cervical 11/23/2016  . DDD (degenerative disc disease), lumbar 11/23/2016  . Other idiopathic scoliosis, lumbar region 11/23/2016  . PCP NOTES >>>>>>>>>>>>>>>>>>>> 11/21/2015  . Constipation 02/15/2015  . Annual physical exam 11/20/2013  . Unspecified adverse effect of unspecified drug, medicinal and biological substance 12/10/2011  . BREAST CANCER, HX OF 11/02/2008  . GERD 10/18/2007  . Primary osteoarthritis of both hands 10/12/2007  . COLONIC POLYPS, HX OF 10/12/2007  . MIGRAINES, HX OF 10/12/2007     Current Meds  Medication Sig  . Calcium Carbonate-Vitamin D 600-400 MG-UNIT tablet Take 1 tablet by mouth 2 (two) times daily.  . cetirizine (ZYRTEC) 10 MG tablet Take 10 mg by mouth daily.  . citalopram (CELEXA) 40 MG tablet 1po qd Filled by Malachi Paradise-  Nurse Practitioner, [facility]  .  Fluticasone-Umeclidin-Vilant (TRELEGY ELLIPTA) 100-62.5-25 MCG/INH AEPB Inhale 1 puff into the lungs daily. Rinse mouth  . methocarbamol (ROBAXIN) 750 MG tablet TAKE 1 TABLET BY MOUTH 2 TIMES DAILY AS NEEDED FOR MUSCLE SPASMS (NON FORMULARY)  . Probiotic Product (PROBIOTIC PO) Take 1 tablet by mouth daily.  Marland Kitchen Spacer/Aero-Holding Chambers (VORTEX VALVED HOLDING CHAMBER) DEVI See admin instructions. use with inhaler  . Vitamin D, Ergocalciferol, (DRISDOL) 1.25 MG (  50000 UT) CAPS capsule TAKE 1 CAPSULE (50,000 UNITS TOTAL) BY MOUTH EVERY 7 (SEVEN) DAYS.  . [DISCONTINUED] citalopram (CELEXA) 40 MG tablet Take 1 tablet (40 mg total) by mouth daily. For mood control  . [DISCONTINUED] PROAIR HFA 108 (90 Base) MCG/ACT inhaler INHALE 2 PUFFS INTO THE LUNGS EVERY 4 (FOUR) HOURS AS NEEDED FOR WHEEZING OR SHORTNESS OF BREATH.     Allergies  Allergen Reactions  . Amoxicillin-Pot Clavulanate     diarrhea  . Doxycycline     Nausea & vomiting  . Venlafaxine     ? Reaction; ? Blurred vision     ROS:  See above HPI for pertinent positives and negatives   Objective:   Blood pressure 119/64, temperature 98.4 F (36.9 C), height 5' (1.524 m), weight 87 lb 8 oz (39.7 kg).  (if some vitals are omitted, this means that patient was UNABLE to obtain them even though they were asked to get them prior to OV today.  They were asked to call us at their earliest convenience with these once obtained.)  General: A & O * 3; visually in no acute distress; in usual state of health.  Skin: Visible skin appears normal and pt's usual skin color HEENT:  EOMI, head is normocephalic and atraumatic.  Sclera are anicteric. Neck has a good range of motion.  Lips are non-cyanotic -No mouth sores, NO inc redness or discoloration of tongue appreciated Chest: normal chest excursion and movement Respiratory: speaking in full sentences, no conversational dyspnea; no use of accessory muscles Psych: insight good, mood- appears full

## 2019-09-13 ENCOUNTER — Ambulatory Visit: Payer: Medicare Other | Admitting: Adult Health

## 2019-09-13 ENCOUNTER — Telehealth: Payer: Self-pay | Admitting: Family Medicine

## 2019-09-13 NOTE — Telephone Encounter (Signed)
Patient declines medication below and states that she will not be able to take any kind of medication like this because is will make her sick. She is requesting a pill.   AS, CMA

## 2019-09-13 NOTE — Telephone Encounter (Signed)
Diflucan 150 mg  1 p.o. x1  Dispense #1, no refill  -Tell patient if this regimen does not work she will need to take the lozenges of clotrimazole as I recommended prior. Clotrimazole lozenges is the most appropriate treatment along with the nystatin swish and swallow for her condition. Diflucan is not the best antifungal for oral candidiasis but will give her this med due to her continued requests

## 2019-09-13 NOTE — Telephone Encounter (Signed)
I do not recommend that.  Please tell her I recommend the loss injures that we discussed yesterday.  Send in prescription for clotrimazole oropharyngeal lozengers 10 mg -1 p.o. 5 times daily x7 days. Dispense 35 no refill

## 2019-09-13 NOTE — Telephone Encounter (Signed)
Patient was seen for thrush yesterday, she is wishing to speak with the clinic staff about the med prescribed and the difficulty she is having with it. She also said she is requesting an order for Diflucan

## 2019-09-13 NOTE — Telephone Encounter (Signed)
Patient states she is unable to use the Nystatin mouth wash that was given yesterday for thrush. It is causing her to vomit. Patient is requesting Diflucan instead. Please advise. AS, CMA

## 2019-09-14 ENCOUNTER — Telehealth: Payer: Self-pay | Admitting: Internal Medicine

## 2019-09-14 MED ORDER — FLUCONAZOLE 100 MG PO TABS: 100.0000 mg | ORAL_TABLET | Freq: Every day | ORAL | 0 refills | Status: DC

## 2019-09-14 MED ORDER — FLUCONAZOLE 150 MG PO TABS: 150.0000 mg | ORAL_TABLET | Freq: Once | ORAL | 0 refills | Status: DC

## 2019-09-14 NOTE — Telephone Encounter (Signed)
Called pt but unable to reach. Left message for pt to return call. 

## 2019-09-14 NOTE — Telephone Encounter (Signed)
Per the live video visit I had with her a day or 2 ago, there was not even any evidence of thrush on physical exam at all.  Patient even walked over to the window so I could have adequate light to look in her mouth.    If patient has only been able to eat 2 times the last 2 days, then she needs to be seen today or tomorrow for an in-person eval!  If there is nothing on my schedule for tomorrow since I am seeing patients remotely this afternoon, then I recommend she be seen in an urgent care today or tomorrow for this.  With symptoms this severe, I do not recommend she wait any longer than a day or 2. Please let her know I am sorry she feels that way and my intent is always to provide the absolute best, evidence-based medicine and care I can provide.

## 2019-09-14 NOTE — Addendum Note (Signed)
Addended by: Mickel Crow on: 09/14/2019 09:38 AM   Modules accepted: Orders

## 2019-09-14 NOTE — Telephone Encounter (Signed)
Pt returned call. Pt thinks she has thrush, she has had it several times before. Pt states she has white spots within in her mouth, her taste is off, and her mouth is tender. Pt was given Nystatin mouthwash by her PCP and states she cannot take this as it caused an episode of emesis due to the taste. Pt was then given 1 tab of Diflucan, pt does not think this is adequate. Pt is requesting Dr. Janee Morn recs if she needs more tabs of Diflucan. Pt states she has trouble with lozenges and mouthrinse as she has a week stomach for those things. That is why she is requesting the oral tablet and states the Diflucan has worked for her in the past for thrush. Pt is using [facility].   Dr. Annamaria Boots please advise. Thanks.   Allergies  Allergen Reactions  . Amoxicillin-Pot Clavulanate     diarrhea  . Doxycycline     Nausea & vomiting  . Venlafaxine     ? Reaction; ? Blurred vision    Current Outpatient Medications on File Prior to Visit  Medication Sig Dispense Refill  . Calcium Carbonate-Vitamin D 600-400 MG-UNIT tablet Take 1 tablet by mouth 2 (two) times daily. 60 tablet 11  . cetirizine (ZYRTEC) 10 MG tablet Take 10 mg by mouth daily.    . citalopram (CELEXA) 40 MG tablet 1po qd Filled by Malachi Paradise-  Nurse Practitioner, [facility] 5 tablet 0  . fluconazole (DIFLUCAN) 150 MG tablet Take 1 tablet (150 mg total) by mouth once for 1 dose. 1 tablet 0  . Fluticasone-Umeclidin-Vilant (TRELEGY ELLIPTA) 100-62.5-25 MCG/INH AEPB Inhale 1 puff into the lungs daily. Rinse mouth 1 each 0  . methocarbamol (ROBAXIN) 750 MG tablet TAKE 1 TABLET BY MOUTH 2 TIMES DAILY AS NEEDED FOR MUSCLE SPASMS (NON FORMULARY) 60 tablet 0  . nystatin (MYCOSTATIN) 100000 UNIT/ML suspension Take 5 mLs (500,000 Units total) by mouth 4 (four) times daily. Swish for 30 seconds and spit out. 180 mL 0  . PROAIR HFA 108 (90 Base) MCG/ACT inhaler INHALE 2 PUFFS INTO THE LUNGS EVERY 4 (FOUR) HOURS AS NEEDED FOR WHEEZING OR  SHORTNESS OF BREATH. 16 g 5  . Probiotic Product (PROBIOTIC PO) Take 1 tablet by mouth daily.    Marland Kitchen Spacer/Aero-Holding Chambers (VORTEX VALVED HOLDING CHAMBER) DEVI See admin instructions. use with inhaler  1  . Vitamin D, Ergocalciferol, (DRISDOL) 1.25 MG (50000 UT) CAPS capsule TAKE 1 CAPSULE (50,000 UNITS TOTAL) BY MOUTH EVERY 7 (SEVEN) DAYS. 12 capsule 10   No current facility-administered medications on file prior to visit.

## 2019-09-14 NOTE — Telephone Encounter (Signed)
Called and spoke with pt letting her know that CY said it was okay for [place] to send in Rx diflucan for pt to take 1 daily x7 days and she verbalized understanding. Verified pt's preferred pharmacy and sent Rx in for pt. Nothing further needed.

## 2019-09-14 NOTE — Telephone Encounter (Signed)
Patient is aware of the below. Medication was called in and patient verbalized understanding.   Patient is very upset and doesn't understand why the provider is treating her this way and giving her such a hard time about getting Diflucan to treat the thrush she has. She states this is not the first time that she has had thrush and says that she feels like she is 'being treated like a child and like she is requesting hard core drugs'. Patient is very upset and is states she is 'almost in tears'. She has only been able to eat 2 times in the last 2 days because of how bad the thrush is.  Patient wanted me to let the provider know how upset she feels and states that she 'thought that the provider was more understanding than this'.  AS, CMA

## 2019-09-14 NOTE — Telephone Encounter (Signed)
Please send Diflucan 100, # 7, 1 daily

## 2019-09-15 NOTE — Telephone Encounter (Signed)
Patient states she has contacted her pulmonologist and he his now treating her for the thrush. Patient states she does not need an appointment with [place] at this time. AS, CMA

## 2019-09-21 ENCOUNTER — Other Ambulatory Visit: Payer: Self-pay

## 2019-09-21 ENCOUNTER — Encounter: Payer: Self-pay | Admitting: Family Medicine

## 2019-09-21 ENCOUNTER — Ambulatory Visit (INDEPENDENT_AMBULATORY_CARE_PROVIDER_SITE_OTHER): Payer: Medicare Other | Admitting: Family Medicine

## 2019-09-21 VITALS — BP 118/78 | Ht 60.0 in | Wt 87.5 lb

## 2019-09-21 DIAGNOSIS — E559 Vitamin D deficiency, unspecified: Secondary | ICD-10-CM

## 2019-09-21 DIAGNOSIS — J449 Chronic obstructive pulmonary disease, unspecified: Secondary | ICD-10-CM

## 2019-09-21 DIAGNOSIS — F4323 Adjustment disorder with mixed anxiety and depressed mood: Secondary | ICD-10-CM

## 2019-09-21 DIAGNOSIS — M810 Age-related osteoporosis without current pathological fracture: Secondary | ICD-10-CM

## 2019-09-21 DIAGNOSIS — E782 Mixed hyperlipidemia: Secondary | ICD-10-CM

## 2019-09-21 DIAGNOSIS — F322 Major depressive disorder, single episode, severe without psychotic features: Secondary | ICD-10-CM

## 2019-09-21 MED ORDER — ALBUTEROL SULFATE HFA 108 (90 BASE) MCG/ACT IN AERS: 1.0000 | INHALATION_SPRAY | Freq: Four times a day (QID) | RESPIRATORY_TRACT | 0 refills | Status: DC | PRN

## 2019-09-21 NOTE — Progress Notes (Signed)
Telehealth office visit note for [name], D.O- at Primary Care at [facility]   I connected with current patient today and verified that I am speaking with the correct person using two identifiers.   . Location of the patient: Home . Location of the provider: Office Only the patient (+/- their family members at pt's discretion) and myself were participating in the encounter - This visit type was conducted due to national recommendations for restrictions regarding the COVID-19 Pandemic (e.g. social distancing) in an effort to limit this patient's exposure and mitigate transmission in our community.  This format is felt to be most appropriate for this patient at this time.   - No physical exam could be performed with this format, beyond that communicated to [place] by the patient/ family members as noted.   - Additionally my office staff/ schedulers discussed with the patient that there may be a monetary charge related to this service, depending on their medical insurance.   The patient expressed understanding, and agreed to proceed.       History of Present Illness: No chief complaint on file.    [name], am serving as scribe for Dr. Mellody Peterson.   She has tried the throat lozenges in the past and states she throws up.  Notes "I have a very weak stomach."  - Emotional Health She continues on Celexa 40 mg and continues following up with [facility].  She has a phone conversation with them every three months.  From an emotional standpoint, feels she is stable, doing well.  - COPD Being treated by Dr. Annamaria Boots; now using Trelegy.  - Lab Review Lab work obtained in October of 2020.  Notes she would have reviewed her labs earlier, but did not want to know anything bad before Christmas.  - Osteoporosis Continues Calcium + Vitamin D daily.  Patient knows she needs an up to date DEXA.  Notes she had dental work done; implants put in this year, and got  distracted with this.   GAD 7 : Generalized Anxiety Score 09/21/2019 04/17/2019 07/18/2018  Nervous, Anxious, on Edge 1 0 2  Control/stop worrying 0 1 2  Worry too much - different things 1 1 2   Trouble relaxing 3 3 2   Restless 1 0 2  Easily annoyed or irritable 1 1 2   Afraid - awful might happen 3 0 2  Total GAD 7 Score 10 6 14   Anxiety Difficulty Not difficult at all Not difficult at all Somewhat difficult    Depression screen [facility] 2/9 09/21/2019 09/12/2019 06/28/2019 04/17/2019 07/18/2018  Decreased Interest 1 0 0 1 1  Down, Depressed, Hopeless 1 0 0 1 1  PHQ - 2 Score 2 0 0 2 2  Altered sleeping 3 1 1 2 2   Tired, decreased energy 3 1 1 3 2   Change in appetite 3 1 0 1 1  Feeling bad or failure about yourself  1 0 0 0 1  Trouble concentrating 0 0 0 0 1  Moving slowly or fidgety/restless 0 0 0 0 1  Suicidal thoughts 0 0 0 0 0  PHQ-9 Score 12 3 2 8 10   Difficult doing work/chores Somewhat difficult Somewhat difficult Not difficult at all Not difficult at all Somewhat difficult  Some recent data might be hidden      Impression and Recommendations:    1. Adjustment disorder with mixed anxiety < depressed mood   2. COPD mixed type ([place])  3. Vitamin D deficiency   4. h/o HYPERLIPIDEMIA   5. MDD (major depressive disorder), severe ([place])   6. Age-related osteoporosis without current pathological fracture   7. Postmenopausal bone loss      - Reviewed lab work (06/28/2019) in depth with patient today.  All lab work within normal limits unless otherwise noted.  Extensive education provided and all questions answered.   - Per patient, she delayed review of labs from October because she did not wish to know anything bad before Christmas.  Vitamin D Deficiency - 44.3, up from 24.2 prior, improved and stable at this time. - Discussed goal of maintenance in 40-60 range. - Continue supplementation on daily Vitamin D and Vitamin D + Calcium as prescribed.  - Will continue to monitor  and re-check as discussed.  Age-Related Osteoporosis, Post-Menopausal Bone Loss - Need for updated DEXA scan.  Order placed today.  - Continue calcium supplementation as advised. - Continue to walk and engage in weight-bearing exercise.  - Will continue to monitor.  History of Hyperlipidemia Last FLP obtained 2 months ago: Triglycerides = 95, up from 73 prior. HDL= 103, up from 91 prior. LDL = down to 105 from 124 prior.  - Will default treatment plan to cardiologist.  - Will continue to monitor and re-check as discussed.  Adjustment Disorder w/ Mixed Anxiety, MDD - Managed by [facility]. - Stable at this time, patient denies concerns. - Continue Celexa as prescribed. - Psychiatry will continue to manage patient's treatment plan. - Will continue to monitor alongside specialist.  COPD mixed type - Now using Trelegy prescribed by Dr. Annamaria Boots. - Stable at this time, patient denies concerns. - Continue to obtain medications from Dr. Annamaria Boots of pulmonology. - Will continue to monitor alongside specialist.  [place] patient to continue working toward exercising to improve overall mental, physical, and emotional health.    - Encouraged patient to engage in daily physical activity, especially a formal exercise routine.  Recommended that the patient eventually strive for at least 150 minutes of moderate cardiovascular activity per week according to guidelines established by the [facility].   - Healthy dietary habits encouraged, including low-carb, and high amounts of lean protein in diet.   - Patient should also consume adequate amounts of water.  - Health counseling performed.  All questions answered.   - As part of my medical decision making, I reviewed the following data within the [place] History obtained from pt /family, CMA notes reviewed and incorporated if applicable, Labs reviewed, Radiograph/ tests reviewed if  applicable and OV notes from prior OV's with me, as well as other specialists she/he has seen since seeing me last, were all reviewed and used in my medical decision making process today.    - Additionally, discussion had with patient regarding our treatment plan, and their biases/concerns about that plan were used in my medical decision making today.    - The patient agreed with the plan and demonstrated an understanding of the instructions.   No barriers to understanding were identified.    - Red flag symptoms and signs discussed in detail.  Patient expressed understanding regarding what to do in case of emergency\ urgent symptoms.   - The patient was advised to call back or seek an in-person evaluation if the symptoms worsen or if the condition fails to improve as anticipated.   Return for f/up 4-6 mo for chronic care, sooner if needed.    Orders  Placed This Encounter  Procedures  . DG Bone Density    Meds ordered this encounter  Medications  . albuterol (PROAIR HFA) 108 (90 Base) MCG/ACT inhaler    Sig: Inhale 1-2 puffs into the lungs every 6 (six) hours as needed for wheezing or shortness of breath. RF per PULM    Dispense:  16 g    Refill:  0    Medications Discontinued During This Encounter  Medication Reason  . PROAIR HFA 108 (90 Base) MCG/ACT inhaler       I provided 21 minutes of non face-to-face time during this encounter.  Additional time was spent with charting and coordination of care before and after the actual visit commenced.   Note:  This note was prepared with assistance of Dragon voice recognition software. Occasional wrong-word or sound-a-like substitutions may have occurred due to the inherent limitations of voice recognition software.  This document serves as a record of services personally performed by Rachael Dance, DO. It was created on her behalf by Toni Amend, a trained medical scribe. The creation of this record is based on the scribe's  personal observations and the provider's statements to them.   This case required medical decision making of at least moderate complexity. The above documentation has been reviewed to be accurate and was completed by Marjory Sneddon, D.O.    Patient Care Team    Relationship Specialty Notifications Start End  Rachael Dance, DO PCP - General Family Medicine  08/29/2017   Bo Merino, MD Consulting Physician Rheumatology  10/02/14   West Pugh, NP (Inactive) Nurse Practitioner Gynecology  10/02/14   Richmond Campbell, MD Consulting Physician Gastroenterology  2017/08/29   Druscilla Brownie, MD Consulting Physician Dermatology  08/29/17   Deneise Lever, MD Consulting Physician Pulmonary Disease  05/01/19   Beverly Sessions  Psychiatry  09/12/19    Comment: Malachi Paradise-  Nurse Practitioner, [facility]  Deneise Lever, MD Consulting Physician Pulmonary Disease  09/21/19      -Vitals obtained; medications/ allergies reconciled;  personal medical, social, Sx etc.histories were updated by CMA, reviewed by me and are reflected in chart   Patient Active Problem List   Diagnosis Date Noted  . Mood disorder ([place]) 03/17/2018  . Suicide attempt ([place])   . Family history of colon cancer- sister died 67yo 08/29/17  . Adjustment disorder with mixed anxiety < depressed mood Aug 29, 2017  . Malignant neoplasm of female breast ([place]) 11/23/2016  . COPD mixed type ([place]) 11/20/2013  . h/o HYPERLIPIDEMIA 10/18/2007  . History of gastroesophageal reflux (GERD) 11/23/2016  . CARDIOMYOPATHY, PRIMARY, DILATED 01/01/2009  . ANEMIA, B12 DEFICIENCY 10/12/2007  . Anxiety,depression, insomnia 10/12/2007  . IBS 10/12/2007  . Fibromyalgia--Dr. Herold Harms, on Ultram 07/14/2007  . Family history of diabetes mellitus- mom in 50's 08/29/2017  . B12 deficiency 2017/08/29  . Other insomnia 11/23/2016  . Vitamin D deficiency 07/06/2011  . Osteoporosis 10/12/2007  . History of thrush 09/22/2019  .  Estrogen deficiency 06/28/2019  . Post-menopausal 06/28/2019  . Osteopenia after menopause 11/06/2017  . Medial epicondylitis of right elbow 10/14/2017  . MDD (major depressive disorder), severe ([place]) 09/22/2017  . MDD (major depressive disorder), recurrent episode, moderate ([place])   . Prolonged Q-T interval on ECG 09/20/2017  . Overdose 09/20/2017  . Acute cystitis with hematuria   . Fibromyalgia syndrome 11/23/2016  . Trapezius muscle spasm 11/23/2016  . DDD (degenerative disc disease), cervical 11/23/2016  . DDD (degenerative disc disease), lumbar 11/23/2016  . Other idiopathic  scoliosis, lumbar region 11/23/2016  . PCP NOTES >>>>>>>>>>>>>>>>>>>> 11/21/2015  . Constipation 02/15/2015  . Annual physical exam 11/20/2013  . Unspecified adverse effect of unspecified drug, medicinal and biological substance 12/10/2011  . BREAST CANCER, HX OF 11/02/2008  . GERD 10/18/2007  . Primary osteoarthritis of both hands 10/12/2007  . COLONIC POLYPS, HX OF 10/12/2007  . MIGRAINES, HX OF 10/12/2007     Current Meds  Medication Sig  . albuterol (PROAIR HFA) 108 (90 Base) MCG/ACT inhaler Inhale 1-2 puffs into the lungs every 6 (six) hours as needed for wheezing or shortness of breath. RF per PULM  . Calcium Carbonate-Vitamin D 600-400 MG-UNIT tablet Take 1 tablet by mouth 2 (two) times daily.  . cetirizine (ZYRTEC) 10 MG tablet Take 10 mg by mouth daily.  . citalopram (CELEXA) 40 MG tablet 1po qd Filled by Malachi Paradise-  Nurse Practitioner, [facility]  . fluconazole (DIFLUCAN) 100 MG tablet Take 1 tablet (100 mg total) by mouth daily.  . Fluticasone-Umeclidin-Vilant (TRELEGY ELLIPTA) 100-62.5-25 MCG/INH AEPB Inhale 1 puff into the lungs daily. Rinse mouth  . methocarbamol (ROBAXIN) 750 MG tablet TAKE 1 TABLET BY MOUTH 2 TIMES DAILY AS NEEDED FOR MUSCLE SPASMS (NON FORMULARY)  . nystatin (MYCOSTATIN) 100000 UNIT/ML suspension Take 5 mLs (500,000 Units total) by mouth 4 (four) times daily.  Swish for 30 seconds and spit out.  . Probiotic Product (PROBIOTIC PO) Take 1 tablet by mouth daily.  Marland Kitchen Spacer/Aero-Holding Chambers (VORTEX VALVED HOLDING CHAMBER) DEVI See admin instructions. use with inhaler  . Vitamin D, Ergocalciferol, (DRISDOL) 1.25 MG (50000 UT) CAPS capsule TAKE 1 CAPSULE (50,000 UNITS TOTAL) BY MOUTH EVERY 7 (SEVEN) DAYS.  . [DISCONTINUED] PROAIR HFA 108 (90 Base) MCG/ACT inhaler INHALE 2 PUFFS INTO THE LUNGS EVERY 4 (FOUR) HOURS AS NEEDED FOR WHEEZING OR SHORTNESS OF BREATH.     Allergies:  Allergies  Allergen Reactions  . Amoxicillin-Pot Clavulanate     diarrhea  . Doxycycline     Nausea & vomiting  . Venlafaxine     ? Reaction; ? Blurred vision     ROS:  See above HPI for pertinent positives and negatives   Objective:   Blood pressure 118/78, height 5' (1.524 m), weight 87 lb 8 oz (39.7 kg).  (if some vitals are omitted, this means that patient was UNABLE to obtain them even though they were asked to get them prior to OV today.  They were asked to call us at their earliest convenience with these once obtained. )  General: A & O * 3; sounds in no acute distress; in usual state of health.  Skin: Pt confirms warm and dry extremities and pink fingertips HEENT: Pt confirms lips non-cyanotic Chest: Patient confirms normal chest excursion and movement Respiratory: speaking in full sentences, no conversational dyspnea; patient confirms no use of accessory muscles Psych: insight appears good, mood- appears full

## 2019-09-22 DIAGNOSIS — Z8619 Personal history of other infectious and parasitic diseases: Secondary | ICD-10-CM | POA: Insufficient documentation

## 2019-10-18 ENCOUNTER — Other Ambulatory Visit: Payer: Self-pay | Admitting: Rheumatology

## 2019-10-18 NOTE — Telephone Encounter (Signed)
Last Visit: 09/05/19 Next Visit: 03/05/20  Okay to refill per Dr. Estanislado Pandy

## 2019-10-19 ENCOUNTER — Ambulatory Visit: Payer: Medicare Other | Admitting: Family Medicine

## 2019-11-23 ENCOUNTER — Other Ambulatory Visit: Payer: Self-pay | Admitting: Family Medicine

## 2019-11-23 DIAGNOSIS — M8580 Other specified disorders of bone density and structure, unspecified site: Secondary | ICD-10-CM

## 2019-11-23 DIAGNOSIS — E559 Vitamin D deficiency, unspecified: Secondary | ICD-10-CM

## 2019-11-23 NOTE — Telephone Encounter (Signed)
I am unsure if pt is to continue this medication indefinitely or only use OTC at this time.  Please review and refill if appropriate.  Charyl Bigger, CMA

## 2019-12-04 ENCOUNTER — Other Ambulatory Visit: Payer: Self-pay | Admitting: Rheumatology

## 2019-12-05 NOTE — Telephone Encounter (Signed)
Last Visit: 09/05/2019 Next Visit: 03/05/2020  Last fill: 10/18/2019  Okay to refill per Dr. Estanislado Pandy.

## 2020-01-01 ENCOUNTER — Ambulatory Visit
Admission: RE | Admit: 2020-01-01 | Discharge: 2020-01-01 | Disposition: A | Discharge status: Home / Self Care | Payer: Medicare Other | Source: Ambulatory Visit | Attending: Family Medicine | Admitting: Family Medicine

## 2020-01-01 ENCOUNTER — Other Ambulatory Visit: Payer: Self-pay

## 2020-01-01 DIAGNOSIS — M810 Age-related osteoporosis without current pathological fracture: Secondary | ICD-10-CM

## 2020-01-01 DIAGNOSIS — E559 Vitamin D deficiency, unspecified: Secondary | ICD-10-CM

## 2020-01-01 DIAGNOSIS — M8588 Other specified disorders of bone density and structure, other site: Secondary | ICD-10-CM | POA: Diagnosis not present

## 2020-01-01 DIAGNOSIS — Z780 Asymptomatic menopausal state: Secondary | ICD-10-CM | POA: Diagnosis not present

## 2020-01-02 NOTE — Progress Notes (Signed)
Office Visit Note  Patient: [name]             Date of Birth: 06/03/1951           MRN: [id number]             PCP: Lorrene Reid, PA-C Referring: Lorrene Reid, PA-C Visit Date: 01/09/2020 Occupation: @GUAROCC @  Subjective:  Trapezius muscle spasms bilaterally   History of Present Illness: [name] is a 69 y.o. female with history of fibromyalgia and DDD.  Patient presents today with trapezius muscle spasms bilaterally.  She requested trigger point injections today.  She takes Robaxin 500 mg twice daily as needed for muscle spasms.  She continues to experience intermittent trochanter bursitis bilaterally.  She has not been performing stretching exercises recently.  He continues to have chronic neck and lower back pain but denies any new or worsening symptoms.  She is not having any symptoms of radiculopathy currently.  She continues to have chronic fatigue secondary to insomnia.  She has difficulty falling asleep as well as staying asleep.  Patient reports she has been experiencing some stiffness and weakness in her left hand.  She denies any joint swelling.  She is able to make a complete fist without difficulty.  She denies any hand cramping.  Activities of Daily Living:  Patient reports morning stiffness for 1 hour.   Patient Denies nocturnal pain.  Difficulty dressing/grooming: Denies Difficulty climbing stairs: Reports Difficulty getting out of chair: Denies Difficulty using hands for taps, buttons, cutlery, and/or writing: Reports  Review of Systems  Constitutional: Positive for fatigue.  HENT: Positive for mouth dryness. Negative for mouth sores and nose dryness.   Eyes: Negative for pain, visual disturbance and dryness.  Respiratory: Negative for cough, hemoptysis and difficulty breathing.   Cardiovascular: Positive for swelling in legs/feet. Negative for chest pain, palpitations and hypertension.  Gastrointestinal: Positive for constipation. Negative  for blood in stool and diarrhea.  Endocrine: Negative for increased urination.  Genitourinary: Negative for difficulty urinating and painful urination.  Musculoskeletal: Positive for arthralgias, joint pain, joint swelling, muscle weakness, morning stiffness and muscle tenderness. Negative for myalgias and myalgias.  Skin: Negative for color change, pallor, rash, hair loss, nodules/bumps, skin tightness, ulcers and sensitivity to sunlight.  Allergic/Immunologic: Negative for susceptible to infections.  Neurological: Negative for dizziness and headaches.  Hematological: Negative for swollen glands.  Psychiatric/Behavioral: Negative for depressed mood and sleep disturbance. The patient is not nervous/anxious.     PMFS History:  Patient Active Problem List   Diagnosis Date Noted   History of thrush 09/22/2019   Estrogen deficiency 06/28/2019   Post-menopausal 06/28/2019   Mood disorder ([place]) 03/17/2018   Osteopenia after menopause 11/06/2017   Medial epicondylitis of right elbow 10/14/2017   MDD (major depressive disorder), severe ([place]) 09/22/2017   MDD (major depressive disorder), recurrent episode, moderate ([facility])    Prolonged Q-T interval on ECG 09/20/2017   Overdose 09/20/2017   Acute cystitis with hematuria    Suicide attempt [facility])    Family history of diabetes mellitus- mom in 50's 08/27/17    Class: Chronic   Family history of colon cancer- sister died 45yo 2017/08/27   Adjustment disorder with mixed anxiety < depressed mood August 27, 2017   B12 deficiency 08-27-17   Fibromyalgia syndrome 11/23/2016   Other insomnia 11/23/2016   Trapezius muscle spasm 11/23/2016   DDD (degenerative disc disease), cervical 11/23/2016   DDD (degenerative disc disease), lumbar 11/23/2016   Other idiopathic scoliosis, lumbar  region 11/23/2016   Malignant neoplasm of female breast ([place]) 11/23/2016   History of gastroesophageal reflux (GERD) 11/23/2016   PCP NOTES  >>>>>>>>>>>>>>>>>>>> 11/21/2015   Constipation 02/15/2015   Annual physical exam 11/20/2013   COPD mixed type ([place]) 11/20/2013   Unspecified adverse effect of unspecified drug, medicinal and biological substance 12/10/2011   Vitamin D deficiency 07/06/2011   CARDIOMYOPATHY, PRIMARY, DILATED 01/01/2009   BREAST CANCER, HX OF 11/02/2008   h/o HYPERLIPIDEMIA 10/18/2007   GERD 10/18/2007   ANEMIA, B12 DEFICIENCY 10/12/2007   Anxiety,depression, insomnia 10/12/2007   IBS 10/12/2007   Primary osteoarthritis of both hands 10/12/2007   Osteoporosis 10/12/2007   COLONIC POLYPS, HX OF 10/12/2007   MIGRAINES, HX OF 10/12/2007   Fibromyalgia--Dr. Herold Harms, on Ultram 07/14/2007    Past Medical History:  Diagnosis Date   Anemia    B12 deficient   B12 deficiency    Breast cancer ([place])    left breast.   Chronic SI joint pain    Bilateral, Dr. Estanislado Pandy   Fibromyalgia    Dr. Estanislado Pandy   GERD (gastroesophageal reflux disease)    Greater trochanteric bursitis of both hips    Dr. Estanislado Pandy   Hyperlipidemia    IBS (irritable bowel syndrome)    LV dysfunction    iatrogenic from chemotherapy ; on Carvedilol    Migraines    Osteoporosis    Dr Lomax---> transferring to a new gyn    Family History  Problem Relation Age of Onset   Diabetes Mother    Breast cancer Mother    Depression Mother        anxiety   Heart disease Mother        in her 2s   Colon cancer Sister    Healthy Daughter    Stroke Neg Hx    Past Surgical History:  Procedure Laterality Date   ABDOMINAL HYSTERECTOMY     BSO for  Endometriosis   CARPAL TUNNEL RELEASE     left   CERVICAL DISC SURGERY     COLONOSCOPY W/ POLYPECTOMY  1999   Dr Earlean Shawl   ESOPHAGEAL DILATION  2005   Dr Earlean Shawl   MASTECTOMY  2009   bilateral, Dr Boyce Medici     Social History   Social History Narrative   Lives w/ husband   Immunization History  Administered Date(s) Administered     Influenza Whole 07/14/2007, 05/31/2012   Influenza, High Dose Seasonal PF 07/18/2018   Td 04/01/1998   Tdap 10/29/2011     Objective: Vital Signs: BP (!) 105/52 (BP Location: Right Arm, Patient Position: Sitting, Cuff Size: Normal)    Pulse 86    Resp 18    Ht 5' (1.524 m)    Wt 90 lb 6.4 oz (41 kg)    BMI 17.66 kg/m    Physical Exam Vitals and nursing note reviewed.  Constitutional:      Appearance: She is well-developed.  HENT:     Head: Normocephalic and atraumatic.  Eyes:     Conjunctiva/sclera: Conjunctivae normal.  Pulmonary:     Effort: Pulmonary effort is normal.  Abdominal:     General: Bowel sounds are normal.     Palpations: Abdomen is soft.  Musculoskeletal:     Cervical back: Normal range of motion.  Lymphadenopathy:     Cervical: No cervical adenopathy.  Skin:    General: Skin is warm and dry.     Capillary Refill: Capillary refill takes less than 2 seconds.  Neurological:     Mental Status: She is alert and oriented to person, place, and time.  Psychiatric:        Behavior: Behavior normal.      Musculoskeletal Exam: C-spine limited range of motion with lateral rotation and flexion and extension.  Trapezius muscle spasms bilaterally.  Thoracic and lumbar spine have good range of motion.  Midline spinal tenderness in the lumbar region.  Shoulder joints, elbow joints, wrist joints, MCPs and PIPs and DIPs good range of motion with no synovitis.  She has mild PIP and DIP thickening consistent with osteoarthritis of both hands.  She has complete fist formation bilaterally.  Hip joints have good range of motion with no discomfort.  She has tenderness over bilateral trochanteric bursa.  Knee joints have good range of motion with no warmth or effusion.  Ankle joints have good range of motion note with no tenderness or inflammation.  CDAI Exam: CDAI Score: -- Patient Global: --; Provider Global: -- Swollen: --; Tender: -- Joint Exam 01/09/2020   No joint exam  has been documented for this visit   There is currently no information documented on the homunculus. Go to the Rheumatology activity and complete the homunculus joint exam.  Investigation: No additional findings.  Imaging: DG Bone Density  Result Date: 01/01/2020 EXAM: DUAL X-RAY ABSORPTIOMETRY (DXA) FOR BONE MINERAL DENSITY IMPRESSION: Referring Physician:  Mellody Dance Your patient completed a BMD test using Lunar IDXA DXA system ( analysis version: 16 ) manufactured by [employer]. Technologist: AW PATIENT: Name: Oleva, Kipp Patient ID: [id number] Birth Date: [birth year] Height: 59.0 in. Sex: Female Measured: 01/01/2020 Weight: 90.0 lbs. Indications: Bilateral Ovariectomy (65.51), Caucasian, Celexa, COPD, Estrogen Deficient, Hysterectomy, Osteoporosis (733), Postmenopausal Fractures: None Treatments: Calcium (E943.0), Vitamin D (E933.5) ASSESSMENT: The BMD measured at Femur Neck Right is 0.609 g/cm2 with a T-score of -3.1. This patient is considered osteoporotic according to [facility]) criteria. The scan quality is good. L-3 a L-4 were excluded due to degenerative changes. Site Region Measured Date Measured Age YA BMD Significant CHANGE T-score DualFemur Neck Right 01/01/2020    69.3         -3.1    0.609 g/cm2 AP Spine  L1-L2      01/01/2020    69.3         -1.8    0.943 g/cm2 DualFemur Total Mean 01/01/2020    69.3         -2.9    0.639 g/cm2 World Health Organization [facility]) criteria for post-menopausal, Caucasian Women: Normal       T-score at or above -1 SD Osteopenia   T-score between -1 and -2.5 SD Osteoporosis T-score at or below -2.5 SD RECOMMENDATION: 1. All patients should optimize calcium and vitamin D intake. 2. Consider FDA approved medical therapies in postmenopausal women and men aged 80 years and older, based on the following: a. A hip or vertebral (clinical or morphometric) fracture b. T- score < or = -2.5 at the femoral neck or spine after appropriate  evaluation to exclude secondary causes c. Low bone mass (T-score between -1.0 and -2.5 at the femoral neck or spine) and a 10 year probability of a hip fracture > or = 3% or a 10 year probability of a major osteoporosis-related fracture > or = 20% based on the [place]-adapted WHO algorithm d. Clinician judgment and/or patient preferences may indicate treatment for people with 10-year fracture probabilities above or below these levels FOLLOW-UP: Patients with diagnosis of  osteoporosis or at high risk for fracture should have regular bone mineral density tests. For patients eligible for Medicare, routine testing is allowed once every 2 years. The testing frequency can be increased to one year for patients who have rapidly progressing disease, those who are receiving or discontinuing medical therapy to restore bone mass, or have additional risk factors. I have reviewed this report and agree with the above findings. [facility] Electronically Signed   By: Lowella Grip III M.D.   On: 01/01/2020 13:31    Recent Labs: Lab Results  Component Value Date   WBC 3.9 06/28/2019   HGB 12.1 06/28/2019   PLT 168 06/28/2019   NA 141 06/28/2019   K 4.2 06/28/2019   CL 101 06/28/2019   CO2 27 06/28/2019   GLUCOSE 81 06/28/2019   BUN 15 06/28/2019   CREATININE 0.86 06/28/2019   BILITOT 0.3 06/28/2019   ALKPHOS 57 06/28/2019   AST 32 06/28/2019   ALT 16 06/28/2019   PROT 6.6 06/28/2019   ALBUMIN 4.4 06/28/2019   CALCIUM 9.7 06/28/2019   GFRAA 80 06/28/2019    Speciality Comments: Narcotic Agreement Broken- [place] found on last UDS.   Procedures:  Trigger Point Inj  Date/Time: 01/09/2020 2:56 PM Performed by: Ofilia Neas, PA-C Authorized by: Ofilia Neas, PA-C   Consent Given by:  Patient Site marked: the procedure site was marked   Timeout: prior to procedure the correct patient, procedure, and site was verified   Indications:  Pain Total # of Trigger Points:  2 Location: neck     Needle Size:  27 G Approach:  Dorsal Medications #1:  0.5 mL lidocaine 1 %; 10 mg triamcinolone acetonide 40 MG/ML Medications #2:  0.5 mL lidocaine 1 %; 10 mg triamcinolone acetonide 40 MG/ML Patient tolerance:  Patient tolerated the procedure well with no immediate complications   Allergies: Amoxicillin-pot clavulanate, Doxycycline, and Venlafaxine   Assessment / Plan:     Visit Diagnoses: Fibromyalgia: She has generalized hyperalgesia and positive tender points on exam.  She continues to have frequent fibromyalgia flares.  She has generalized myalgias and muscle tenderness currently.  She presents today with trapezius muscle tension and muscle tenderness bilaterally.  She has been experiencing muscle spasms intermittently and takes Robaxin 500 mg 1 tablet twice daily as needed for symptomatic relief.  She requested trigger point injections today.  She tolerated procedure well.  Procedure note note was completed above.  She continues to have chronic fatigue secondary to insomnia.  We discussed the importance of regular exercise and good sleep hygiene.  She will follow-up in the office in 6 months.  Trapezius muscle spasm: She has trapezius muscle tension and muscle tenderness bilaterally.  She has been experiencing muscle spasms intermittently.  She has limited range of motion of the C-spine with discomfort.  She requested trigger point injections today.  She tolerated procedure well.  The procedure note was completed above.  DDD (degenerative disc disease), cervical: Chronic pain.  She has limited range of motion with discomfort.  She is experiencing the symptoms radiculopathy.  She does have trapezius muscle tension and also tenderness bilaterally.  She has been experiencing muscle spasms intermittently and takes Robaxin 500 mg 1 tablet twice daily as needed.   DDD (degenerative disc disease), lumbar: Chronic pain.  She has midline spinal tenderness in the lumbar region.  She has no symptoms of  radiculopathy at this time.  Lateral epicondylitis of both elbows: Resolved.  Trochanteric bursitis of both  hips: She has tenderness over bilateral trochanteric bursa.  She experiences discomfort when lying on her sides.  She was encouraged to perform stretching exercises on a daily basis.  She was advised to notify us if her symptoms persist or worsen and she would like to return for a cortisone injection in the future.  Primary osteoarthritis of both hands: She has mild PIP and DIP thickening consistent with osteoarthritis of both hands.  She is experiencing increased stiffness and decreased grip strength in the left hand recently.  She has complete fist formation bilaterally.  No tenderness or inflammation was noted.  Joint protection and muscle strengthening were discussed.  Several hand exercises were demonstrated today in the office.  She was advised to notify us if she develops increased joint pain or joint swelling.  Age-related osteoporosis without current pathological fracture - DEXA on 01/01/20: The BMD measured at Femur Neck Right is 0.609 g/cm2 with a T-scoreof -3.1.  She has not had any recent falls or fractures.  She is taking calcium and vitamin D.  She will be following up with her PCP to discuss treatment options.  Other medical conditions are listed as follows:  History of scoliosis: Chronic pain.  Other insomnia: She has difficulty falling asleep and staying asleep.  History of IBS  History of migraine  History of hyperlipidemia  History of vitamin D deficiency: She is taking vitamin D supplement.  History of depression  History of anxiety  History of gastroesophageal reflux (GERD)  History of COPD  Orders: Orders Placed This Encounter  Procedures   Trigger Point Inj   Meds ordered this encounter  Medications   methocarbamol (ROBAXIN) 750 MG tablet    Sig: TAKE 1 TABLET BY MOUTH 2 TIMES DAILY AS NEEDED FOR MUSCLE SPASMS (NON FORMULARY)    Dispense:  60  tablet    Refill:  0      Follow-Up Instructions: Return in about 6 months (around 07/11/2020) for Fibromyalgia.   Ofilia Neas, PA-C  Note - This record has been created using Dragon software.  Chart creation errors have been sought, but may not always  have been located. Such creation errors do not reflect on  the standard of medical care.

## 2020-01-09 ENCOUNTER — Encounter: Payer: Self-pay | Admitting: Physician Assistant

## 2020-01-09 ENCOUNTER — Other Ambulatory Visit: Payer: Self-pay

## 2020-01-09 ENCOUNTER — Ambulatory Visit: Payer: Medicare Other | Admitting: Physician Assistant

## 2020-01-09 VITALS — BP 105/52 | HR 86 | Resp 18 | Ht 60.0 in | Wt 90.4 lb

## 2020-01-09 DIAGNOSIS — M19041 Primary osteoarthritis, right hand: Secondary | ICD-10-CM

## 2020-01-09 DIAGNOSIS — Z8669 Personal history of other diseases of the nervous system and sense organs: Secondary | ICD-10-CM

## 2020-01-09 DIAGNOSIS — M5136 Other intervertebral disc degeneration, lumbar region: Secondary | ICD-10-CM

## 2020-01-09 DIAGNOSIS — M7711 Lateral epicondylitis, right elbow: Secondary | ICD-10-CM

## 2020-01-09 DIAGNOSIS — M797 Fibromyalgia: Secondary | ICD-10-CM

## 2020-01-09 DIAGNOSIS — Z8719 Personal history of other diseases of the digestive system: Secondary | ICD-10-CM

## 2020-01-09 DIAGNOSIS — Z8739 Personal history of other diseases of the musculoskeletal system and connective tissue: Secondary | ICD-10-CM

## 2020-01-09 DIAGNOSIS — M19042 Primary osteoarthritis, left hand: Secondary | ICD-10-CM

## 2020-01-09 DIAGNOSIS — Z8709 Personal history of other diseases of the respiratory system: Secondary | ICD-10-CM

## 2020-01-09 DIAGNOSIS — M5030 Other cervical disc degeneration, unspecified cervical region: Secondary | ICD-10-CM

## 2020-01-09 DIAGNOSIS — G4709 Other insomnia: Secondary | ICD-10-CM

## 2020-01-09 DIAGNOSIS — Z8659 Personal history of other mental and behavioral disorders: Secondary | ICD-10-CM

## 2020-01-09 DIAGNOSIS — M810 Age-related osteoporosis without current pathological fracture: Secondary | ICD-10-CM

## 2020-01-09 DIAGNOSIS — M62838 Other muscle spasm: Secondary | ICD-10-CM

## 2020-01-09 DIAGNOSIS — M7712 Lateral epicondylitis, left elbow: Secondary | ICD-10-CM

## 2020-01-09 DIAGNOSIS — M7061 Trochanteric bursitis, right hip: Secondary | ICD-10-CM

## 2020-01-09 DIAGNOSIS — Z8639 Personal history of other endocrine, nutritional and metabolic disease: Secondary | ICD-10-CM

## 2020-01-09 DIAGNOSIS — M7062 Trochanteric bursitis, left hip: Secondary | ICD-10-CM

## 2020-01-09 MED ORDER — LIDOCAINE HCL 1 % IJ SOLN
0.5000 mL | INTRAMUSCULAR | Status: AC | PRN
Start: 2020-01-09 — End: 2020-01-09
  Administered 2020-01-09: .5 mL

## 2020-01-09 MED ORDER — LIDOCAINE HCL 1 % IJ SOLN
0.5000 mL | INTRAMUSCULAR | Status: AC | PRN
  Administered 2020-01-09: .5 mL

## 2020-01-09 MED ORDER — TRIAMCINOLONE ACETONIDE 40 MG/ML IJ SUSP
10.0000 mg | INTRAMUSCULAR | Status: AC | PRN
  Administered 2020-01-09: 10 mg via INTRAMUSCULAR

## 2020-01-09 MED ORDER — METHOCARBAMOL 750 MG PO TABS: ORAL_TABLET | ORAL | 0 refills | Status: DC

## 2020-01-10 ENCOUNTER — Telehealth: Payer: Self-pay | Admitting: Physician Assistant

## 2020-01-10 NOTE — Telephone Encounter (Signed)
-----   Message from Lorrene Reid, [place] sent at 01/02/2020  3:23 PM EDT ----- Mindi Junker,   Please call Ms. Bester and let her know DEXA scan results show her T-score is -3.1 and is considered osteoporotic. I recommend starting an oral bisphosphonate and see she was on Risedronate in the past. Please inquire why she is no longer taking, and if she tolerated it well then I advise restarting it. She should continue her calcium and vitamin D supplements.   Thank you, Maritza     Patient is aware of the above and states that she can not take anything that you have to drink water with first thing in the morning because it makes her sick. Then she was switched to a different one that you didn't have to drink water with but the cost was too much and insurance wouldn't cover so she stopped taking.    Patient states that she is open to trying a new bisphosphonate.  Patient also verbalized that she is taking Calcium and Vit D. AS, CMA

## 2020-01-10 NOTE — Telephone Encounter (Signed)
-----   Message from Lorrene Reid, [place] sent at 01/02/2020  3:23 PM EDT ----- Mindi Junker,  Please call Ms. Wattley and let her know DEXA scan results show her T-score is -3.1 and is considered osteoporotic. I recommend starting an oral bisphosphonate and see she was on Risedronate in the past. Please inquire why she is no longer taking, and if she tolerated it well then I advise restarting it. She should continue her calcium and vitamin D supplements.  Thank you, Herb Grays

## 2020-01-10 NOTE — Telephone Encounter (Signed)
Patient says [name] left her a message to call office.  --FYI --call returned.  --glh

## 2020-01-10 NOTE — Telephone Encounter (Signed)
Letter sent to patient with results and for patient to call office. AS,CMA

## 2020-01-16 ENCOUNTER — Telehealth: Payer: Self-pay

## 2020-01-16 NOTE — Telephone Encounter (Signed)
Received fax from [employer] about potential clinical concern: Drug age: methocarbamol >=70 year old.   Reviewed by Hazel Sams, PA-C and noted to please notify patient of the alert we received. Please advise the patient to only take robaxin PRN.   I attempted to contact patient and left message on machine to advise patient to call the office.

## 2020-01-16 NOTE — Telephone Encounter (Signed)
Left message for patient to call back. AS, CMA 

## 2020-01-16 NOTE — Telephone Encounter (Signed)
Risedronate also has a delayed release form which she can take after breakfast. The dose is 35 mg once weekly and I can send rx to see if insurance will cover it.  Thank you, Herb Grays

## 2020-01-17 MED ORDER — RISEDRONATE SODIUM 35 MG PO TBEC: 35.0000 mg | DELAYED_RELEASE_TABLET | ORAL | 0 refills | Status: DC

## 2020-01-17 NOTE — Telephone Encounter (Signed)
Patient is aware of the below and is agreeable to try medication.  New script sent to requested pharmacy. AS, CMA

## 2020-01-18 NOTE — Telephone Encounter (Signed)
Attempted to contact patient and left message on machine to advise patient of the alert we received. advised the patient to only take robaxin PRN.

## 2020-02-09 ENCOUNTER — Other Ambulatory Visit: Payer: Self-pay | Admitting: Physician Assistant

## 2020-02-09 NOTE — Telephone Encounter (Signed)
Ok to refill 

## 2020-02-09 NOTE — Telephone Encounter (Signed)
Last Visit: 01/09/2020 Next Visit: 07/09/2020  Last Fill: 01/09/2020  Okay to refill Methocarbamol?

## 2020-03-05 ENCOUNTER — Ambulatory Visit: Payer: Medicare Other | Admitting: Rheumatology

## 2020-04-10 NOTE — Progress Notes (Signed)
Office Visit Note  Patient: [name]             Date of Birth: 03-20-1951           MRN: [id number]             PCP: Lorrene Reid, PA-C Referring: Lorrene Reid, PA-C Visit Date: 04/15/2020 Occupation: @GUAROCC @  Subjective:  Trapezius muscle spasms   History of Present Illness: [name] is a 69 y.o. female with history of fibromyalgia and DDD.  Patient presents today with trapezius muscle tension and muscle tenderness bilaterally.  She has been experiencing muscle spasms intermittently.  She takes Robaxin 500 mg twice daily as needed for muscle spasms.  She has ongoing neck stiffness.  She requested trigger point injections today.  She continues to have generalized muscle aches and muscle tenderness due to underlying fibromyalgia.  She has been experiencing left-sided rib cage pain for the past 3 weeks.  She states that she was performing an overhead exercise and feels as though she strained a muscle in her back.  She states that the pain is kind of a burning sensation but she has not noticed a rash develop.  She states that the pain is improving.  Activities of Daily Living:  Patient reports morning stiffness for 1.5 hours.   Patient Reports nocturnal pain.  Difficulty dressing/grooming: Denies Difficulty climbing stairs: Denies Difficulty getting out of chair: Denies Difficulty using hands for taps, buttons, cutlery, and/or writing: Reports  Review of Systems  Constitutional: Positive for fatigue.  HENT: Positive for mouth dryness. Negative for mouth sores and nose dryness.   Eyes: Negative for itching and dryness.  Respiratory: Negative for shortness of breath and difficulty breathing.   Cardiovascular: Negative for chest pain and palpitations.  Gastrointestinal: Negative for blood in stool, constipation and diarrhea.  Endocrine: Negative for increased urination.  Genitourinary: Negative for difficulty urinating.  Musculoskeletal: Positive for  arthralgias, joint pain, myalgias, morning stiffness, muscle tenderness and myalgias. Negative for joint swelling.  Skin: Negative for color change, rash and redness.  Allergic/Immunologic: Negative for susceptible to infections.  Neurological: Positive for numbness, headaches and weakness. Negative for dizziness and memory loss.  Hematological: Positive for bruising/bleeding tendency.  Psychiatric/Behavioral: Negative for confusion.    PMFS History:  Patient Active Problem List   Diagnosis Date Noted  . History of thrush 09/22/2019  . Estrogen deficiency 06/28/2019  . Post-menopausal 06/28/2019  . Mood disorder ([place]) 03/17/2018  . Osteopenia after menopause 11/06/2017  . Medial epicondylitis of right elbow 10/14/2017  . MDD (major depressive disorder), severe ([place]) 09/22/2017  . MDD (major depressive disorder), recurrent episode, moderate ([place])   . Prolonged Q-T interval on ECG 09/20/2017  . Overdose 09/20/2017  . Acute cystitis with hematuria   . Suicide attempt ([place])   . Family history of diabetes mellitus- mom in 50's 08/30/2017    Class: Chronic  . Family history of colon cancer- sister died 31yo 30-Aug-2017  . Adjustment disorder with mixed anxiety < depressed mood 08-30-2017  . B12 deficiency 30-Aug-2017  . Fibromyalgia syndrome 11/23/2016  . Other insomnia 11/23/2016  . Trapezius muscle spasm 11/23/2016  . DDD (degenerative disc disease), cervical 11/23/2016  . DDD (degenerative disc disease), lumbar 11/23/2016  . Other idiopathic scoliosis, lumbar region 11/23/2016  . Malignant neoplasm of female breast ([place]) 11/23/2016  . History of gastroesophageal reflux (GERD) 11/23/2016  . PCP NOTES >>>>>>>>>>>>>>>>>>>> 11/21/2015  . Constipation 02/15/2015  . Annual physical exam 11/20/2013  . COPD  mixed type ([place]) 11/20/2013  . Unspecified adverse effect of unspecified drug, medicinal and biological substance 12/10/2011  . Vitamin D deficiency 07/06/2011  . CARDIOMYOPATHY,  PRIMARY, DILATED 01/01/2009  . BREAST CANCER, HX OF 11/02/2008  . h/o HYPERLIPIDEMIA 10/18/2007  . GERD 10/18/2007  . ANEMIA, B12 DEFICIENCY 10/12/2007  . Anxiety,depression, insomnia 10/12/2007  . IBS 10/12/2007  . Primary osteoarthritis of both hands 10/12/2007  . Osteoporosis 10/12/2007  . COLONIC POLYPS, HX OF 10/12/2007  . MIGRAINES, HX OF 10/12/2007  . Fibromyalgia--Dr. Herold Harms, on Ultram 07/14/2007    Past Medical History:  Diagnosis Date  . Anemia    B12 deficient  . B12 deficiency   . Breast cancer ([place])    left breast.  . Chronic SI joint pain    Bilateral, Dr. Estanislado Pandy  . Fibromyalgia    Dr. Estanislado Pandy  . GERD (gastroesophageal reflux disease)   . Greater trochanteric bursitis of both hips    Dr. Estanislado Pandy  . Hyperlipidemia   . IBS (irritable bowel syndrome)   . LV dysfunction    iatrogenic from chemotherapy ; on Carvedilol   . Migraines   . Osteoporosis    Dr Lomax---> transferring to a new gyn    Family History  Problem Relation Age of Onset  . Diabetes Mother   . Breast cancer Mother   . Depression Mother        anxiety  . Heart disease Mother        in her 27s  . Colon cancer Sister   . Healthy Daughter   . Stroke Neg Hx    Past Surgical History:  Procedure Laterality Date  . ABDOMINAL HYSTERECTOMY     BSO for  Endometriosis  . CARPAL TUNNEL RELEASE     left  . CERVICAL DISC SURGERY    . COLONOSCOPY W/ POLYPECTOMY  1999   Dr Earlean Shawl  . ESOPHAGEAL DILATION  2005   Dr Earlean Shawl  . MASTECTOMY  2009   bilateral, Dr Margot Chimes  . SEPTOPLASTY     Social History   Social History Narrative   Lives w/ husband   Immunization History  Administered Date(s) Administered  . Influenza Whole 07/14/2007, 05/31/2012  . Influenza, High Dose Seasonal PF 07/18/2018  . PFIZER SARS-COV-2 Vaccination 10/30/2019, 11/26/2019  . Td 04/01/1998  . Tdap 10/29/2011     Objective: Vital Signs: BP (!) 91/58 (BP Location: Right Arm, Patient Position: Sitting, Cuff  Size: Normal)   Pulse 87   Resp 13   Ht 5' (1.524 m)   Wt 87 lb (39.5 kg)   BMI 16.99 kg/m    Physical Exam Vitals and nursing note reviewed.  Constitutional:      Appearance: She is well-developed.  HENT:     Head: Normocephalic and atraumatic.  Eyes:     Conjunctiva/sclera: Conjunctivae normal.  Pulmonary:     Effort: Pulmonary effort is normal.  Abdominal:     General: Bowel sounds are normal.     Palpations: Abdomen is soft.  Musculoskeletal:     Cervical back: Normal range of motion.  Lymphadenopathy:     Cervical: No cervical adenopathy.  Skin:    General: Skin is warm and dry.     Capillary Refill: Capillary refill takes less than 2 seconds.  Neurological:     Mental Status: She is alert and oriented to person, place, and time.  Psychiatric:        Behavior: Behavior normal.      Musculoskeletal Exam: C-spine limited  range of motion.  Trapezius muscle tension and muscle tenderness bilaterally.  Thoracic and lumbar spine have good range of motion.  Shoulder joints, elbow joints, wrist joints, MCPs, PIPs, and DIPs have good range of motion with no synovitis.  She has complete fist formation bilaterally.  Hip joints, knee joints, ankle joints, MTPs, PIPs, DIPs have good range of motion with no synovitis.  No warmth or effusion of bilateral knee joints noted.  No tenderness or swelling of ankle joints.  CDAI Exam: CDAI Score: -- Patient Global: --; Provider Global: -- Swollen: --; Tender: -- Joint Exam 04/15/2020   No joint exam has been documented for this visit   There is currently no information documented on the homunculus. Go to the Rheumatology activity and complete the homunculus joint exam.  Investigation: No additional findings.  Imaging: No results found.  Recent Labs: Lab Results  Component Value Date   WBC 3.9 06/28/2019   HGB 12.1 06/28/2019   PLT 168 06/28/2019   NA 141 06/28/2019   K 4.2 06/28/2019   CL 101 06/28/2019   CO2 27 06/28/2019    GLUCOSE 81 06/28/2019   BUN 15 06/28/2019   CREATININE 0.86 06/28/2019   BILITOT 0.3 06/28/2019   ALKPHOS 57 06/28/2019   AST 32 06/28/2019   ALT 16 06/28/2019   PROT 6.6 06/28/2019   ALBUMIN 4.4 06/28/2019   CALCIUM 9.7 06/28/2019   GFRAA 80 06/28/2019    Speciality Comments: Narcotic Agreement Broken- [place] found on last UDS.   Procedures:  Trigger Point Inj  Date/Time: 04/15/2020 4:56 PM Performed by: Ofilia Neas, PA-C Authorized by: Ofilia Neas, PA-C   Consent Given by:  Patient Site marked: the procedure site was marked   Timeout: prior to procedure the correct patient, procedure, and site was verified   Indications:  Pain Total # of Trigger Points:  2 Location: neck   Needle Size:  27 G Approach:  Dorsal Medications #1:  0.5 mL lidocaine 1 %; 10 mg triamcinolone acetonide 40 MG/ML Medications #2:  0.5 mL lidocaine 1 %; 10 mg triamcinolone acetonide 40 MG/ML Patient tolerance:  Patient tolerated the procedure well with no immediate complications   Allergies: Amoxicillin-pot clavulanate, Doxycycline, and Venlafaxine   Assessment / Plan:     Visit Diagnoses: Fibromyalgia: She has generalized hyperalgesia and positive tender points on exam.  She presents today with trapezius muscle tension and muscle tenderness bilaterally.  She has been experiencing muscle spasms intermittently and takes Robaxin 750 mg twice daily as needed for relief.  She requested trigger point injections today.  She tolerated the procedure well.  She typically gets about 3 months of relief after these injections and continues to find them effective.  We discussed the importance of regular exercise and good sleep hygiene.  She will follow-up in the office in 3 months.  Trapezius muscle spasm -She had trigger point injections on 01/09/2020 which provided significant relief for about 3 months.  She presents today with increased muscle tension and tenderness bilaterally.  She requested repeat  trigger point injections today.  She tolerated the procedure well.  The procedure note was completed above.  Aftercare was discussed.  DDD (degenerative disc disease), cervical - She has painful and limited ROM. Crepitus noted.  She is having trapezius muscle tension and tenderness bilaterally.  She is taking Robaxin 750 mg 1 tablet twice daily as needed.   DDD (degenerative disc disease), lumbar: She has intermittent lower back pain.  She has no symptoms  of radiculopathy at this time.  She recently had a left sided muscle strain.  She was encouraged to continue to use a heating pad and try using salonpas patches or biofreeze topically for pain relief.   Lateral epicondylitis of both elbows - Resolved.  Trochanteric bursitis of both hips:  She has intermittent discomfort.  Tenderness to palpation on exam.   Primary osteoarthritis of both hands:   Age-related osteoporosis without current pathological fracture - DEXA on 01/01/20: The BMD measured at Femur Neck Right is 0.609 g/cm2 with a T-scoreof -3.1.  She is taking a calcium and vitamin D supplement as recommended.  She is taking risedronate sodium 35 mg 1 tablet by mouth once weekly.    Other insomnia:  She experiences nocturnal pain which contributes to her insomnia.   Other medical conditions are listed as follows:   History of scoliosis  History of IBS  History of vitamin D deficiency  History of gastroesophageal reflux (GERD)  History of migraine  History of hyperlipidemia  History of depression  History of anxiety  History of COPD  Orders: Orders Placed This Encounter  Procedures  . Trigger Point Inj   No orders of the defined types were placed in this encounter.    Follow-Up Instructions: Return in about 3 months (around 07/16/2020) for Fibromyalgia, Osteoarthritis.   Ofilia Neas, PA-C  Note - This record has been created using Dragon software.  Chart creation errors have been sought, but may not always    have been located. Such creation errors do not reflect on  the standard of medical care.

## 2020-04-12 ENCOUNTER — Other Ambulatory Visit: Payer: Self-pay | Admitting: Rheumatology

## 2020-04-12 NOTE — Telephone Encounter (Signed)
Last Visit: 01/09/2020 Next Visit: 07/09/2020  Last Fill: 02/09/2020  Okay to refill Methocarbamol?

## 2020-04-15 ENCOUNTER — Encounter: Payer: Self-pay | Admitting: Physician Assistant

## 2020-04-15 ENCOUNTER — Ambulatory Visit: Payer: Medicare Other | Admitting: Physician Assistant

## 2020-04-15 ENCOUNTER — Other Ambulatory Visit: Payer: Self-pay

## 2020-04-15 VITALS — BP 91/58 | HR 87 | Resp 13 | Ht 60.0 in | Wt 87.0 lb

## 2020-04-15 DIAGNOSIS — M5030 Other cervical disc degeneration, unspecified cervical region: Secondary | ICD-10-CM

## 2020-04-15 DIAGNOSIS — M62838 Other muscle spasm: Secondary | ICD-10-CM | POA: Diagnosis not present

## 2020-04-15 DIAGNOSIS — Z8639 Personal history of other endocrine, nutritional and metabolic disease: Secondary | ICD-10-CM

## 2020-04-15 DIAGNOSIS — Z8709 Personal history of other diseases of the respiratory system: Secondary | ICD-10-CM

## 2020-04-15 DIAGNOSIS — M19041 Primary osteoarthritis, right hand: Secondary | ICD-10-CM

## 2020-04-15 DIAGNOSIS — Z8669 Personal history of other diseases of the nervous system and sense organs: Secondary | ICD-10-CM

## 2020-04-15 DIAGNOSIS — G4709 Other insomnia: Secondary | ICD-10-CM

## 2020-04-15 DIAGNOSIS — M797 Fibromyalgia: Secondary | ICD-10-CM | POA: Diagnosis not present

## 2020-04-15 DIAGNOSIS — M7061 Trochanteric bursitis, right hip: Secondary | ICD-10-CM

## 2020-04-15 DIAGNOSIS — M51369 Other intervertebral disc degeneration, lumbar region without mention of lumbar back pain or lower extremity pain: Secondary | ICD-10-CM

## 2020-04-15 DIAGNOSIS — Z8739 Personal history of other diseases of the musculoskeletal system and connective tissue: Secondary | ICD-10-CM

## 2020-04-15 DIAGNOSIS — Z8719 Personal history of other diseases of the digestive system: Secondary | ICD-10-CM

## 2020-04-15 DIAGNOSIS — M5136 Other intervertebral disc degeneration, lumbar region: Secondary | ICD-10-CM | POA: Diagnosis not present

## 2020-04-15 DIAGNOSIS — M7711 Lateral epicondylitis, right elbow: Secondary | ICD-10-CM

## 2020-04-15 DIAGNOSIS — M810 Age-related osteoporosis without current pathological fracture: Secondary | ICD-10-CM

## 2020-04-15 DIAGNOSIS — M7712 Lateral epicondylitis, left elbow: Secondary | ICD-10-CM

## 2020-04-15 DIAGNOSIS — M19042 Primary osteoarthritis, left hand: Secondary | ICD-10-CM

## 2020-04-15 DIAGNOSIS — Z8659 Personal history of other mental and behavioral disorders: Secondary | ICD-10-CM

## 2020-04-15 DIAGNOSIS — M7062 Trochanteric bursitis, left hip: Secondary | ICD-10-CM

## 2020-04-15 MED ORDER — LIDOCAINE HCL 1 % IJ SOLN
0.5000 mL | INTRAMUSCULAR | Status: AC | PRN
  Administered 2020-04-15: .5 mL

## 2020-04-15 MED ORDER — TRIAMCINOLONE ACETONIDE 40 MG/ML IJ SUSP
10.0000 mg | INTRAMUSCULAR | Status: AC | PRN
  Administered 2020-04-15: 10 mg via INTRAMUSCULAR

## 2020-04-15 NOTE — Patient Instructions (Signed)

## 2020-04-18 ENCOUNTER — Encounter: Payer: Self-pay | Admitting: Genetic Counselor

## 2020-04-26 ENCOUNTER — Telehealth: Payer: Self-pay | Admitting: Physician Assistant

## 2020-04-26 ENCOUNTER — Telehealth: Payer: Self-pay | Admitting: Internal Medicine

## 2020-04-26 MED ORDER — FLUCONAZOLE 100 MG PO TABS
100.0000 mg | ORAL_TABLET | Freq: Every day | ORAL | 0 refills | Status: DC
Start: 2020-04-26 — End: 2020-09-06

## 2020-04-26 NOTE — Telephone Encounter (Signed)
Rx for diflucan has been sent to preferred pharmacy. Attempted to call pt to let her know this had been done but unable to reach.left her a detailed message letting her know that the Rx had been sent to the pharmacy for her. Nothing further needed.

## 2020-04-26 NOTE — Telephone Encounter (Signed)
Patient has not been seen since 1/21 and was advised then to follow up in 6 months.  Herb Grays has never seen patient before.   I advised patient we would not be able to treat thrush without patient being seen. Patient verbalized understanding. I offered to schedule patient apt for next available and she said she would call us back. AS, CMA

## 2020-04-26 NOTE — Telephone Encounter (Signed)
Patient called in stating she has thrush and would like to get a prescription without having to be seen.   Thanks

## 2020-04-26 NOTE — Telephone Encounter (Signed)
Dr. Annamaria Boots, please advise if you are okay prescribing diflucan for pt to help with thrush.  Allergies  Allergen Reactions  . Amoxicillin-Pot Clavulanate     diarrhea  . Doxycycline     Nausea & vomiting  . Venlafaxine     ? Reaction; ? Blurred vision     Current Outpatient Medications:  .  albuterol (PROAIR HFA) 108 (90 Base) MCG/ACT inhaler, Inhale 1-2 puffs into the lungs every 6 (six) hours as needed for wheezing or shortness of breath. RF per PULM, Disp: 16 g, Rfl: 0 .  Calcium Carbonate-Vitamin D 600-400 MG-UNIT tablet, Take 1 tablet by mouth 2 (two) times daily., Disp: 60 tablet, Rfl: 11 .  cetirizine (ZYRTEC) 10 MG tablet, Take 10 mg by mouth daily., Disp: , Rfl:  .  citalopram (CELEXA) 40 MG tablet, 1po qd Filled by Malachi Paradise-  Nurse Practitioner, [facility], Disp: 5 tablet, Rfl: 0 .  fluconazole (DIFLUCAN) 100 MG tablet, Take 1 tablet (100 mg total) by mouth daily. (Patient not taking: Reported on 04/15/2020), Disp: 7 tablet, Rfl: 0 .  Fluticasone-Umeclidin-Vilant (TRELEGY ELLIPTA) 100-62.5-25 MCG/INH AEPB, Inhale 1 puff into the lungs daily. Rinse mouth, Disp: 1 each, Rfl: 0 .  methocarbamol (ROBAXIN) 750 MG tablet, TAKE 1 TABLET BY MOUTH 2 TIMES DAILY AS NEEDED FOR MUSCLE SPASMS (NON FORMULARY), Disp: 60 tablet, Rfl: 0 .  Probiotic Product (PROBIOTIC PO), Take 1 tablet by mouth daily., Disp: , Rfl:  .  Risedronate Sodium 35 MG TBEC, Take 1 tablet (35 mg total) by mouth once a week., Disp: 12 tablet, Rfl: 0 .  Vitamin D, Ergocalciferol, (DRISDOL) 1.25 MG (50000 UNIT) CAPS capsule, TAKE 1 CAPSULE BY MOUTH EVERY 7 DAYS, Disp: 12 capsule, Rfl: 1

## 2020-04-26 NOTE — Telephone Encounter (Signed)
Diflucan 100 mg, # 7, 1 daily 

## 2020-05-17 ENCOUNTER — Other Ambulatory Visit: Payer: Self-pay | Admitting: Rheumatology

## 2020-05-17 NOTE — Telephone Encounter (Signed)
Last Visit: 04/15/2020 Next Visit: 07/09/2020  Current Dose per office note 04/15/2020: Robaxin 750 mg twice daily as needed for relief DX: Fibromyalgia  Okay to refill Robaxin?

## 2020-06-24 ENCOUNTER — Other Ambulatory Visit: Payer: Self-pay | Admitting: Internal Medicine

## 2020-06-25 NOTE — Progress Notes (Signed)
Office Visit Note  Patient: [name]             Date of Birth: 1951/07/31           MRN: [id number]             PCP: Lorrene Reid, PA-C Referring: Lorrene Reid, PA-C Visit Date: 07/09/2020 Occupation: @GUAROCC @  Subjective:  Trapezius muscle tension and tenderness   History of Present Illness: [name] is a 69 y.o. female with history of fibromyalgia.  She has persistent myalgias and muscle tenderness due to underlying fibromyalgia.  She presents today with trapezius muscle tension and muscle tenderness bilaterally.  She experiences muscle spasms on occasion and takes Robaxin 500 mg twice daily as needed for muscle spasms.  She has ongoing discomfort due to trochanter bursitis of both hips.  She states that she sleeps on her back as well as her sides at night and has to change positions frequently.  She denies any lower back pain at this time.  She continues to have interrupted sleep at night but states her fatigue has been stable.   Activities of Daily Living:  Patient reports morning stiffness for 45 minutes.   Patient Denies nocturnal pain.  Difficulty dressing/grooming: Denies Difficulty climbing stairs: Denies Difficulty getting out of chair: Denies Difficulty using hands for taps, buttons, cutlery, and/or writing: Denies  Review of Systems  Constitutional: Positive for fatigue.  HENT: Positive for mouth dryness. Negative for mouth sores and nose dryness.   Eyes: Negative for pain, visual disturbance and dryness.  Respiratory: Negative for cough, hemoptysis and difficulty breathing.   Cardiovascular: Negative for chest pain, palpitations, hypertension and swelling in legs/feet.  Gastrointestinal: Negative for blood in stool, constipation and diarrhea.  Endocrine: Negative for increased urination.  Genitourinary: Negative for difficulty urinating and painful urination.  Musculoskeletal: Positive for arthralgias, joint pain, muscle weakness, morning  stiffness and muscle tenderness. Negative for joint swelling, myalgias and myalgias.  Skin: Negative for color change, pallor, rash, hair loss, nodules/bumps, skin tightness, ulcers and sensitivity to sunlight.  Allergic/Immunologic: Negative for susceptible to infections.  Neurological: Negative for dizziness, numbness and headaches.  Hematological: Positive for bruising/bleeding tendency. Negative for swollen glands.  Psychiatric/Behavioral: Positive for sleep disturbance. Negative for depressed mood. The patient is not nervous/anxious.     PMFS History:  Patient Active Problem List   Diagnosis Date Noted  . History of thrush 09/22/2019  . Estrogen deficiency 06/28/2019  . Post-menopausal 06/28/2019  . Mood disorder ([place]) 03/17/2018  . Osteopenia after menopause 11/06/2017  . Medial epicondylitis of right elbow 10/14/2017  . MDD (major depressive disorder), severe ([place]) 09/22/2017  . MDD (major depressive disorder), recurrent episode, moderate ([place])   . Prolonged Q-T interval on ECG 09/20/2017  . Overdose 09/20/2017  . Acute cystitis with hematuria   . Suicide attempt ([place])   . Family history of diabetes mellitus- mom in 50's 2017-08-13    Class: Chronic  . Family history of colon cancer- sister died 64yo August 13, 2017  . Adjustment disorder with mixed anxiety < depressed mood 08/13/17  . B12 deficiency 08/13/2017  . Fibromyalgia syndrome 11/23/2016  . Other insomnia 11/23/2016  . Trapezius muscle spasm 11/23/2016  . DDD (degenerative disc disease), cervical 11/23/2016  . DDD (degenerative disc disease), lumbar 11/23/2016  . Other idiopathic scoliosis, lumbar region 11/23/2016  . Malignant neoplasm of female breast ([place]) 11/23/2016  . History of gastroesophageal reflux (GERD) 11/23/2016  . PCP NOTES >>>>>>>>>>>>>>>>>>>> 11/21/2015  . Constipation 02/15/2015  .  Annual physical exam 11/20/2013  . COPD mixed type ([place]) 11/20/2013  . Unspecified adverse effect of unspecified  drug, medicinal and biological substance 12/10/2011  . Vitamin D deficiency 07/06/2011  . CARDIOMYOPATHY, PRIMARY, DILATED 01/01/2009  . BREAST CANCER, HX OF 11/02/2008  . h/o HYPERLIPIDEMIA 10/18/2007  . GERD 10/18/2007  . ANEMIA, B12 DEFICIENCY 10/12/2007  . Anxiety,depression, insomnia 10/12/2007  . IBS 10/12/2007  . Primary osteoarthritis of both hands 10/12/2007  . Osteoporosis 10/12/2007  . COLONIC POLYPS, HX OF 10/12/2007  . MIGRAINES, HX OF 10/12/2007  . Fibromyalgia--Dr. Herold Harms, on Ultram 07/14/2007    Past Medical History:  Diagnosis Date  . Anemia    B12 deficient  . B12 deficiency   . Breast cancer ([place])    left breast.  . Chronic SI joint pain    Bilateral, Dr. Estanislado Pandy  . Fibromyalgia    Dr. Estanislado Pandy  . GERD (gastroesophageal reflux disease)   . Greater trochanteric bursitis of both hips    Dr. Estanislado Pandy  . Hyperlipidemia   . IBS (irritable bowel syndrome)   . LV dysfunction    iatrogenic from chemotherapy ; on Carvedilol   . Migraines   . Osteoporosis    Dr Lomax---> transferring to a new gyn    Family History  Problem Relation Age of Onset  . Diabetes Mother   . Breast cancer Mother   . Depression Mother        anxiety  . Heart disease Mother        in her 36s  . Colon cancer Sister   . Healthy Daughter   . Stroke Neg Hx    Past Surgical History:  Procedure Laterality Date  . ABDOMINAL HYSTERECTOMY     BSO for  Endometriosis  . CARPAL TUNNEL RELEASE     left  . CERVICAL DISC SURGERY    . COLONOSCOPY W/ POLYPECTOMY  1999   Dr Earlean Shawl  . ESOPHAGEAL DILATION  2005   Dr Earlean Shawl  . MASTECTOMY  2009   bilateral, Dr Margot Chimes  . SEPTOPLASTY     Social History   Social History Narrative   Lives w/ husband   Immunization History  Administered Date(s) Administered  . Influenza Whole 07/14/2007, 05/31/2012  . Influenza, High Dose Seasonal PF 07/18/2018  . PFIZER SARS-COV-2 Vaccination 10/30/2019, 11/26/2019  . Td 04/01/1998  . Tdap  10/29/2011     Objective: Vital Signs: BP (!) 105/58 (BP Location: Right Arm, Patient Position: Sitting, Cuff Size: Normal)   Pulse 93   Resp 16   Ht 5' (1.524 m)   Wt 87 lb 6.4 oz (39.6 kg)   BMI 17.07 kg/m    Physical Exam Vitals and nursing note reviewed.  Constitutional:      Appearance: She is well-developed.  HENT:     Head: Normocephalic and atraumatic.  Eyes:     Conjunctiva/sclera: Conjunctivae normal.  Pulmonary:     Effort: Pulmonary effort is normal.  Abdominal:     Palpations: Abdomen is soft.  Musculoskeletal:     Cervical back: Normal range of motion.  Skin:    General: Skin is warm and dry.     Capillary Refill: Capillary refill takes less than 2 seconds.  Neurological:     Mental Status: She is alert and oriented to person, place, and time.  Psychiatric:        Behavior: Behavior normal.      Musculoskeletal Exam: Generalized hyperalgesia and positive tender joints.  C-spine limited ROM.  Trapezius  muscle tension and tenderness bilaterally.  Thoracic and lumbar spine good ROM.  Shoulder joints, elbow joints, wrist joints, MCPs, PIPs, and DIPs good ROM with no synovitis.  Complete fist formation bilaterally.  PIP and DIP thickening consistent with osteoarthritis of both hands.  Hip joints good ROM with discomfort bilaterally.  Knee joints good ROM with no warmth or effusion.  Ankle joints good ROM with no tenderness or inflammation.  Tenderness over bilateral trochanteric bursa.    CDAI Exam: CDAI Score: -- Patient Global: --; Provider Global: -- Swollen: --; Tender: -- Joint Exam 07/09/2020   No joint exam has been documented for this visit   There is currently no information documented on the homunculus. Go to the Rheumatology activity and complete the homunculus joint exam.  Investigation: No additional findings.  Imaging: No results found.  Recent Labs: Lab Results  Component Value Date   WBC 3.9 06/28/2019   HGB 12.1 06/28/2019   PLT 168  06/28/2019   NA 141 06/28/2019   K 4.2 06/28/2019   CL 101 06/28/2019   CO2 27 06/28/2019   GLUCOSE 81 06/28/2019   BUN 15 06/28/2019   CREATININE 0.86 06/28/2019   BILITOT 0.3 06/28/2019   ALKPHOS 57 06/28/2019   AST 32 06/28/2019   ALT 16 06/28/2019   PROT 6.6 06/28/2019   ALBUMIN 4.4 06/28/2019   CALCIUM 9.7 06/28/2019   GFRAA 80 06/28/2019    Speciality Comments: Narcotic Agreement Broken- [place] found on last UDS.   Procedures:  Trigger Point Inj  Date/Time: 07/09/2020 1:39 PM Performed by: Ofilia Neas, PA-C Authorized by: Ofilia Neas, PA-C   Consent Given by:  Patient Site marked: the procedure site was marked   Timeout: prior to procedure the correct patient, procedure, and site was verified   Indications:  Pain Total # of Trigger Points:  2 Location: neck   Needle Size:  27 G Approach:  Dorsal Medications #1:  0.5 mL lidocaine 1 %; 10 mg triamcinolone acetonide 40 MG/ML Medications #2:  0.5 mL lidocaine 1 %; 10 mg triamcinolone acetonide 40 MG/ML Patient tolerance:  Patient tolerated the procedure well with no immediate complications   Allergies: Amoxicillin-pot clavulanate, Doxycycline, and Venlafaxine   Assessment / Plan:     Visit Diagnoses: Fibromyalgia: She has generalized hyperalgesia and positive tender points on exam.  She continues to have generalized myalgias and muscle tenderness due to underlying fibromyalgia.  She has been experiencing frequent flares with recent weather changes.  She takes Robaxin 500 mg twice daily as needed for muscle spasms.  She presents today with trapezius muscle tension and muscle tenderness bilaterally.  She requested trigger point injections today.  She tolerated procedure well.  Procedure note was completed above.  Aftercare was discussed.  She was also given a handout of neck exercises to start performing on a regular basis.  We discussed the use of topical agents, massage, and heat.  She is also experiencing  persistent discomfort due to trochanter bursitis of both hips and was strongly encouraged to perform stretching exercises on a daily basis.  She experiences interrupted sleep at night when lying on her sides.  She has chronic fatigue secondary to insomnia.  We discussed the importance of regular exercise and good sleep hygiene.  She will follow-up in the office in 3 months.  Trapezius muscle spasm -She presents today with trapezius muscle tension and muscle tenderness bilaterally.  She requested bilateral trigger point injections.  The most recent trigger point injections were  performed on 04/15/2020 which provided significant but temporary relief.  She tolerated procedure well today.  Aftercare was discussed.  The procedure note was completed above.  We discussed the importance of performing stretching exercises, using heat, topical agents, and performing massage.  She can continue to take Robaxin 500 mg twice daily as needed for muscle spasms.  She does not need a refill at this time.  DDD (degenerative disc disease), cervical - She has limited range of motion of the C-spine especially with lateral rotation bilaterally.  She has trapezius muscle tension and muscle tenderness bilaterally.  She experiences muscle spasms occasionally and takes Robaxin 750 mg 1 tablet twice daily as needed for relief.  She does not need a refill of Robaxin at this time.  We discussed the importance of performing neck stretching exercises on a daily basis.  She was given a handout of these exercises to perform.  DDD (degenerative disc disease), lumbar: She is not experiencing any lower back pain currently.  No symptoms of radiculopathy.  Lateral epicondylitis of both elbows - She experiences intermittent discomfort but has no tenderness palpation on examination today.  Trochanteric bursitis of both hips: She has tenderness ovation over bilateral trochanteric bursa.  She was encouraged to perform stretching exercises on a daily  basis and was given a handout of these exercises to perform.  She was advised to notify us if her discomfort persists or worsens.  Primary osteoarthritis of both hands: She has PIP and DIP thickening consistent with osteoarthritis of both hands.  No joint tenderness or inflammation was noted on exam.  She is able to make a complete fist bilaterally.  Joint protection and muscle strengthening were discussed.  Age-related osteoporosis without current pathological fracture - DEXA on 01/01/20: The BMD measured at Femur Neck Right is 0.609 g/cm2 with a T-scoreof -3.1.  She is taking risedronate sodium 35 mg 1 tablet by mouth once weekly as well as a calcium and vitamin D supplement.  She has not had any recent falls or fractures  Other insomnia: She continues to have interrupted sleep at night.  She has to change positions frequently for comfort.  Good sleep hygiene was discussed.  Other medical conditions are listed as follows:  History of IBS  History of scoliosis  History of gastroesophageal reflux (GERD)  History of vitamin D deficiency  History of migraine  History of COPD  History of depression  History of hyperlipidemia  History of anxiety  Orders: Orders Placed This Encounter  Procedures  . Trigger Point Inj   No orders of the defined types were placed in this encounter.    Follow-Up Instructions: Return in about 3 months (around 10/09/2020) for Fibromyalgia, Osteoarthritis, DDD.   Ofilia Neas, PA-C  Note - This record has been created using Dragon software.  Chart creation errors have been sought, but may not always  have been located. Such creation errors do not reflect on  the standard of medical care.

## 2020-07-09 ENCOUNTER — Ambulatory Visit: Payer: Medicare Other | Admitting: Physician Assistant

## 2020-07-09 ENCOUNTER — Other Ambulatory Visit: Payer: Self-pay

## 2020-07-09 ENCOUNTER — Encounter: Payer: Self-pay | Admitting: Physician Assistant

## 2020-07-09 VITALS — BP 105/58 | HR 93 | Resp 16 | Ht 60.0 in | Wt 87.4 lb

## 2020-07-09 DIAGNOSIS — M62838 Other muscle spasm: Secondary | ICD-10-CM

## 2020-07-09 DIAGNOSIS — M5030 Other cervical disc degeneration, unspecified cervical region: Secondary | ICD-10-CM | POA: Diagnosis not present

## 2020-07-09 DIAGNOSIS — Z8739 Personal history of other diseases of the musculoskeletal system and connective tissue: Secondary | ICD-10-CM

## 2020-07-09 DIAGNOSIS — Z8659 Personal history of other mental and behavioral disorders: Secondary | ICD-10-CM

## 2020-07-09 DIAGNOSIS — M5136 Other intervertebral disc degeneration, lumbar region: Secondary | ICD-10-CM | POA: Diagnosis not present

## 2020-07-09 DIAGNOSIS — M19042 Primary osteoarthritis, left hand: Secondary | ICD-10-CM

## 2020-07-09 DIAGNOSIS — M797 Fibromyalgia: Secondary | ICD-10-CM

## 2020-07-09 DIAGNOSIS — M19041 Primary osteoarthritis, right hand: Secondary | ICD-10-CM

## 2020-07-09 DIAGNOSIS — Z8719 Personal history of other diseases of the digestive system: Secondary | ICD-10-CM

## 2020-07-09 DIAGNOSIS — Z8709 Personal history of other diseases of the respiratory system: Secondary | ICD-10-CM

## 2020-07-09 DIAGNOSIS — Z8669 Personal history of other diseases of the nervous system and sense organs: Secondary | ICD-10-CM

## 2020-07-09 DIAGNOSIS — G4709 Other insomnia: Secondary | ICD-10-CM

## 2020-07-09 DIAGNOSIS — Z8639 Personal history of other endocrine, nutritional and metabolic disease: Secondary | ICD-10-CM

## 2020-07-09 DIAGNOSIS — M7062 Trochanteric bursitis, left hip: Secondary | ICD-10-CM

## 2020-07-09 DIAGNOSIS — M810 Age-related osteoporosis without current pathological fracture: Secondary | ICD-10-CM

## 2020-07-09 DIAGNOSIS — M7711 Lateral epicondylitis, right elbow: Secondary | ICD-10-CM

## 2020-07-09 DIAGNOSIS — M7712 Lateral epicondylitis, left elbow: Secondary | ICD-10-CM

## 2020-07-09 DIAGNOSIS — M7061 Trochanteric bursitis, right hip: Secondary | ICD-10-CM

## 2020-07-09 DIAGNOSIS — M51369 Other intervertebral disc degeneration, lumbar region without mention of lumbar back pain or lower extremity pain: Secondary | ICD-10-CM

## 2020-07-09 MED ORDER — LIDOCAINE HCL 1 % IJ SOLN
0.5000 mL | INTRAMUSCULAR | Status: AC | PRN
  Administered 2020-07-09: .5 mL

## 2020-07-09 MED ORDER — TRIAMCINOLONE ACETONIDE 40 MG/ML IJ SUSP
10.0000 mg | INTRAMUSCULAR | Status: AC | PRN
  Administered 2020-07-09: 10 mg via INTRAMUSCULAR

## 2020-07-09 NOTE — Patient Instructions (Signed)
Hip Bursitis Rehab Ask your health care provider which exercises are safe for you. Do exercises exactly as told by your health care provider and adjust them as directed. It is normal to feel mild stretching, pulling, tightness, or discomfort as you do these exercises. Stop right away if you feel sudden pain or your pain gets worse. Do not begin these exercises until told by your health care provider. Stretching exercise This exercise warms up your muscles and joints and improves the movement and flexibility of your hip. This exercise also helps to relieve pain and stiffness. Iliotibial band stretch An iliotibial band is a strong band of muscle tissue that runs from the outer side of your hip to the outer side of your thigh and knee. 1. Lie on your side with your left / right leg in the top position. 2. Bend your left / right knee and grab your ankle. Stretch out your bottom arm to help you balance. 3. Slowly bring your knee back so your thigh is behind your body. 4. Slowly lower your knee toward the floor until you feel a gentle stretch on the outside of your left / right thigh. If you do not feel a stretch and your knee will not fall farther, place the heel of your other foot on top of your knee and pull your knee down toward the floor with your foot. 5. Hold this position for __________ seconds. 6. Slowly return to the starting position. Repeat __________ times. Complete this exercise __________ times a day. Strengthening exercises These exercises build strength and endurance in your hip and pelvis. Endurance is the ability to use your muscles for a long time, even after they get tired. Bridge This exercise strengthens the muscles that move your thigh backward (hip extensors). 1. Lie on your back on a firm surface with your knees bent and your feet flat on the floor. 2. Tighten your buttocks muscles and lift your buttocks off the floor until your trunk is level with your thighs. ? Do not arch  your back. ? You should feel the muscles working in your buttocks and the back of your thighs. If you do not feel these muscles, slide your feet 1-2 inches (2.5-5 cm) farther away from your buttocks. ? If this exercise is too easy, try doing it with your arms crossed over your chest. 3. Hold this position for __________ seconds. 4. Slowly lower your hips to the starting position. 5. Let your muscles relax completely after each repetition. Repeat __________ times. Complete this exercise __________ times a day. Squats This exercise strengthens the muscles in front of your thigh and knee (quadriceps). 1. Stand in front of a table, with your feet and knees pointing straight ahead. You may rest your hands on the table for balance but not for support. 2. Slowly bend your knees and lower your hips like you are going to sit in a chair. ? Keep your weight over your heels, not over your toes. ? Keep your lower legs upright so they are parallel with the table legs. ? Do not let your hips go lower than your knees. ? Do not bend lower than told by your health care provider. ? If your hip pain increases, do not bend as low. 3. Hold the squat position for __________ seconds. 4. Slowly push with your legs to return to standing. Do not use your hands to pull yourself to standing. Repeat __________ times. Complete this exercise __________ times a day. Hip hike 1. Stand  sideways on a bottom step. Stand on your left / right leg with your other foot unsupported next to the step. You can hold on to the railing or wall for balance if needed. 2. Keep your knees straight and your torso square. Then lift your left / right hip up toward the ceiling. 3. Hold this position for __________ seconds. 4. Slowly let your left / right hip lower toward the floor, past the starting position. Your foot should get closer to the floor. Do not lean or bend your knees. Repeat __________ times. Complete this exercise __________ times a  day. Single leg stand 1. Without shoes, stand near a railing or in a doorway. You may hold on to the railing or door frame as needed for balance. 2. Squeeze your left / right buttock muscles, then lift up your other foot. ? Do not let your left / right hip push out to the side. ? It is helpful to stand in front of a mirror for this exercise so you can watch your hip. 3. Hold this position for __________ seconds. Repeat __________ times. Complete this exercise __________ times a day. This information is not intended to replace advice given to you by your health care provider. Make sure you discuss any questions you have with your health care provider. Document Revised: 12/12/2018 Document Reviewed: 12/12/2018 Elsevier Patient Education  [place]. Neck Exercises Ask your health care provider which exercises are safe for you. Do exercises exactly as told by your health care provider and adjust them as directed. It is normal to feel mild stretching, pulling, tightness, or discomfort as you do these exercises. Stop right away if you feel sudden pain or your pain gets worse. Do not begin these exercises until told by your health care provider. Neck exercises can be important for many reasons. They can improve strength and maintain flexibility in your neck, which will help your upper back and prevent neck pain. Stretching exercises Rotation neck stretching  1. Sit in a chair or stand up. 2. Place your feet flat on the floor, shoulder width apart. 3. Slowly turn your head (rotate) to the right until a slight stretch is felt. Turn it all the way to the right so you can look over your right shoulder. Do not tilt or tip your head. 4. Hold this position for 10-30 seconds. 5. Slowly turn your head (rotate) to the left until a slight stretch is felt. Turn it all the way to the left so you can look over your left shoulder. Do not tilt or tip your head. 6. Hold this position for 10-30  seconds. Repeat __________ times. Complete this exercise __________ times a day. Neck retraction 1. Sit in a sturdy chair or stand up. 2. Look straight ahead. Do not bend your neck. 3. Use your fingers to push your chin backward (retraction). Do not bend your neck for this movement. Continue to face straight ahead. If you are doing the exercise properly, you will feel a slight sensation in your throat and a stretch at the back of your neck. 4. Hold the stretch for 1-2 seconds. Repeat __________ times. Complete this exercise __________ times a day. Strengthening exercises Neck press 1. Lie on your back on a firm bed or on the floor with a pillow under your head. 2. Use your neck muscles to push your head down on the pillow and straighten your spine. 3. Hold the position as well as you can. Keep your head facing up (  in a neutral position) and your chin tucked. 4. Slowly count to 5 while holding this position. Repeat __________ times. Complete this exercise __________ times a day. Isometrics These are exercises in which you strengthen the muscles in your neck while keeping your neck still (isometrics). 1. Sit in a supportive chair and place your hand on your forehead. 2. Keep your head and face facing straight ahead. Do not flex or extend your neck while doing isometrics. 3. Push forward with your head and neck while pushing back with your hand. Hold for 10 seconds. 4. Do the sequence again, this time putting your hand against the back of your head. Use your head and neck to push backward against the hand pressure. 5. Finally, do the same exercise on either side of your head, pushing sideways against the pressure of your hand. Repeat __________ times. Complete this exercise __________ times a day. Prone head lifts 1. Lie face-down (prone position), resting on your elbows so that your chest and upper back are raised. 2. Start with your head facing downward, near your chest. Position your chin  either on or near your chest. 3. Slowly lift your head upward. Lift until you are looking straight ahead. Then continue lifting your head as far back as you can comfortably stretch. 4. Hold your head up for 5 seconds. Then slowly lower it to your starting position. Repeat __________ times. Complete this exercise __________ times a day. Supine head lifts 1. Lie on your back (supine position), bending your knees to point to the ceiling and keeping your feet flat on the floor. 2. Lift your head slowly off the floor, raising your chin toward your chest. 3. Hold for 5 seconds. Repeat __________ times. Complete this exercise __________ times a day. Scapular retraction 1. Stand with your arms at your sides. Look straight ahead. 2. Slowly pull both shoulders (scapulae) backward and downward (retraction) until you feel a stretch between your shoulder blades in your upper back. 3. Hold for 10-30 seconds. 4. Relax and repeat. Repeat __________ times. Complete this exercise __________ times a day. Contact a health care provider if:  Your neck pain or discomfort gets much worse when you do an exercise.  Your neck pain or discomfort does not improve within 2 hours after you exercise. If you have any of these problems, stop exercising right away. Do not do the exercises again unless your health care provider says that you can. Get help right away if:  You develop sudden, severe neck pain. If this happens, stop exercising right away. Do not do the exercises again unless your health care provider says that you can. This information is not intended to replace advice given to you by your health care provider. Make sure you discuss any questions you have with your health care provider. Document Revised: 06/15/2018 Document Reviewed: 06/15/2018 Elsevier Patient Education  [place].

## 2020-07-15 ENCOUNTER — Ambulatory Visit: Payer: Medicare Other | Admitting: Physician Assistant

## 2020-08-14 NOTE — Progress Notes (Deleted)
HPI    =========================================== 05/18/2019- 39 yoF former smoker for pulmonary evaluation. Medical problem list includes Dilated Cardiomyopathy, COPD, IBS, GERD, Degenerative Disc Disease, Depression, Hx Breast Cancer/ Bilat mastectomy, no XRT, Chemo stopped due to cardiomyopathy, Fibromyalgia,  Proair hfa, Symbicort 80, Inhalers not clearly helpful. Notes mainly DOE esp picking up and carrying something heavy, or hurrying. Little cough or wheeze. Denies hx of pneumonia, Covid exposure, allergic rhinitis.  Cardiomyopathy limited chemotherapy but she doesn't  think it is an ongoing issue.  ENT+ septoplasty. No recent colds. Breathes ok while sleeping.  Retired from Radi[employer], therapeutic work.  PFT 03/31/17- Severe obstruction, Response to BD, DLCO mod reduced. FVC 2.20/ 83%, FEV1 1.06/ 52%, R 0.48, TLC 125%, DLCO 49%,  Lab-10/04/17 Hgb 11.7 CXR 03/22/17-  FINDINGS: Cardiac and mediastinal contours remain within normal limits. Surgical clips project over the soft tissues of the right and left chest as well of the left axilla. Bilateral breast reconstruction prostheses are present. The lungs are hyperinflated with increased lucency in the upper zones consistent with the clinical history of underlying emphysema. No focal airspace opacity, pleural effusion or pneumothorax. No suspicious pulmonary mass or nodule. Incompletely imaged cervicothoracic fusion hardware. No acute osseous abnormality. Dextroconvex scoliosis of the upper lumbar spine. IMPRESSION: Stable chest x-ray without evidence of acute cardiopulmonary Process.  08/15/20- 44 yoF former smoker followed for COPD, complicated by Dilated Cardiomyopathy( during Chemotherapy), COPD, IBS, GERD, Degenerative Disc Disease, Depression, Hx Breast Cancer/ Bilat mastectomy, no XRT, Chemo stopped due to cardiomyopathy, Fibromyalgia,  Proair hfa, Trelegy 100,  Covid vax- Flu vax-  CXR 05/18/19- IMPRESSION: Marked hyperinflation  and severe upper lobe predominant emphysema. Nodular opacity in the left lung base is stable from 6720, etiology uncertain. The patient may benefit from screening low-dose chest CT.  ROS-see HPI   + = positive Constitutional:    weight loss, night sweats, fevers, chills, fatigue, lassitude. HEENT:    headaches, difficulty swallowing, tooth/dental problems, sore throat,       sneezing, itching, ear ache, nasal congestion, post nasal drip, snoring CV:    chest pain, orthopnea, PND, swelling in lower extremities, anasarca,                                  dizziness, palpitations Resp:  + shortness of breath with exertion or at rest.                productive cough,   non-productive cough, coughing up of blood.              change in color of mucus.  wheezing.   Skin:    rash or lesions. GI:  No-   heartburn, indigestion, abdominal pain, nausea, vomiting, diarrhea,                 change in bowel habits, loss of appetite GU: dysuria, change in color of urine, no urgency or frequency.   flank pain. MS:   joint pain, stiffness, decreased range of motion, back pain. Neuro-     nothing unusual Psych:  change in mood or affect.  depression or anxiety.   memory loss.  OBJ- Physical Exam General- Alert, Oriented, Affect-appropriate, Distress- none acute, petite Skin- rash-none, lesions- none, excoriation- none Lymphadenopathy- none Head- atraumatic            Eyes- Gross vision intact, PERRLA, conjunctivae and secretions clear  Ears- Hearing, canals-normal            Nose- Clear, no-Septal dev, mucus, polyps, erosion, perforation             Throat- Mallampati II-III , mucosa clear , drainage- none, tonsils- atrophic Neck- flexible , trachea midline, no stridor , thyroid nl, carotid no bruit Chest - symmetrical excursion , unlabored           Heart/CV- RRR , no murmur , no gallop  , no rub, nl s1 s2                           - JVD- none , edema- none, stasis changes- none, varices-  none           Lung- clear to P&A/ distant, wheeze- none, cough- none , dullness-none, rub- none           Chest wall- +bilateral reconstruction after breast cancer Abd-  Br/ Gen/ Rectal- Not done, not indicated Extrem- cyanosis- none, clubbing, none, atrophy- none, strength- nl Neuro- grossly intact to observation

## 2020-08-15 ENCOUNTER — Ambulatory Visit: Payer: Medicare Other | Admitting: Internal Medicine

## 2020-09-06 ENCOUNTER — Encounter: Payer: Self-pay | Admitting: Primary Care

## 2020-09-06 ENCOUNTER — Ambulatory Visit: Payer: Medicare Other | Admitting: Primary Care

## 2020-09-06 VITALS — BP 100/58 | HR 76 | Temp 98.7°F | Ht 60.0 in | Wt 86.4 lb

## 2020-09-06 DIAGNOSIS — J449 Chronic obstructive pulmonary disease, unspecified: Secondary | ICD-10-CM | POA: Diagnosis not present

## 2020-09-06 DIAGNOSIS — R0981 Nasal congestion: Secondary | ICD-10-CM | POA: Insufficient documentation

## 2020-09-06 MED ORDER — TRELEGY ELLIPTA 200-62.5-25 MCG/INH IN AEPB: 1.0000 | INHALATION_SPRAY | Freq: Every day | RESPIRATORY_TRACT | 5 refills | Status: DC

## 2020-09-06 MED ORDER — TRELEGY ELLIPTA 200-62.5-25 MCG/INH IN AEPB: 1.0000 | INHALATION_SPRAY | Freq: Every day | RESPIRATORY_TRACT | 0 refills | Status: DC

## 2020-09-06 NOTE — Assessment & Plan Note (Addendum)
-   Patient was start on Trelegy 100 at last visit, she reports some improvement but still experiences moderate dyspnea on exertion and wheezing. She has significant reversibility on PFTs. Plan increased Trelegy to 200 (Sample given and RX sent). Patient needs covid -19 booster, declined influenza vaccine. Needs Alpha-1 testing at next visit. FU in 6 months with Dr. Annamaria Boots or APP

## 2020-09-06 NOTE — Patient Instructions (Addendum)
Pleasure seeing you today [name]  Recommendations: Change to Trelegy 200 (sample given and RX sent) Advise you get covid booster as soon as you are able   Refer: Pulmonary rehab re: COPD GOLD II  Follow-up: 6 months with Dr. Annamaria Boots or sooner if needed    COPD and Physical Activity Chronic obstructive pulmonary disease (COPD) is a long-term (chronic) condition that affects the lungs. COPD is a general term that can be used to describe many different lung problems that cause lung swelling (inflammation) and limit airflow, including chronic bronchitis and emphysema. The main symptom of COPD is shortness of breath, which makes it harder to do even simple tasks. This can also make it harder to exercise and be active. Talk with your health care provider about treatments to help you breathe better and actions you can take to prevent breathing problems during physical activity. What are the benefits of exercising with COPD? Exercising regularly is an important part of a healthy lifestyle. You can still exercise and do physical activities even though you have COPD. Exercise and physical activity improve your shortness of breath by increasing blood flow (circulation). This causes your heart to pump more oxygen through your body. Moderate exercise can improve your:  Oxygen use.  Energy level.  Shortness of breath.  Strength in your breathing muscles.  Heart health.  Sleep.  Self-esteem and feelings of self-worth.  Depression, stress, and anxiety levels. Exercise can benefit everyone with COPD. The severity of your disease may affect how hard you can exercise, especially at first, but everyone can benefit. Talk with your health care provider about how much exercise is safe for you, and which activities and exercises are safe for you. What actions can I take to prevent breathing problems during physical activity?  Sign up for a pulmonary rehabilitation program. This type of program may  include: ? Education about lung diseases. ? Exercise classes that teach you how to exercise and be more active while improving your breathing. This usually involves:  Exercise using your lower extremities, such as a stationary bicycle.  About 30 minutes of exercise, 2 to 5 times per week, for 6 to 12 weeks  Strength training, such as push ups or leg lifts. ? Nutrition education. ? Group classes in which you can talk with others who also have COPD and learn ways to manage stress.  If you use an oxygen tank, you should use it while you exercise. Work with your health care provider to adjust your oxygen for your physical activity. Your resting flow rate is different from your flow rate during physical activity.  While you are exercising: ? Take slow breaths. ? Pace yourself and do not try to go too fast. ? Purse your lips while breathing out. Pursing your lips is similar to a kissing or whistling position. ? If doing exercise that uses a quick burst of effort, such as weight lifting:  Breathe in before starting the exercise.  Breathe out during the hardest part of the exercise (such as raising the weights). Where to find support You can find support for exercising with COPD from:  Your health care provider.  A pulmonary rehabilitation program.  Your local health department or community health programs.  Support groups, online or in-person. Your health care provider may be able to recommend support groups. Where to find more information You can find more information about exercising with COPD from:  American Lung Association: ClassInsider.se.  COPD Foundation: https://www.rivera.net/. Contact a health care provider  if:  Your symptoms get worse.  You have chest pain.  You have nausea.  You have a fever.  You have trouble talking or catching your breath.  You want to start a new exercise program or a new activity. Summary  COPD is a general term that can be used to describe many  different lung problems that cause lung swelling (inflammation) and limit airflow. This includes chronic bronchitis and emphysema.  Exercise and physical activity improve your shortness of breath by increasing blood flow (circulation). This causes your heart to provide more oxygen to your body.  Contact your health care provider before starting any exercise program or new activity. Ask your health care provider what exercises and activities are safe for you. This information is not intended to replace advice given to you by your health care provider. Make sure you discuss any questions you have with your health care provider. Document Revised: 12/07/2018 Document Reviewed: 09/09/2017 Elsevier Patient Education  2020 [employer].

## 2020-09-06 NOTE — Progress Notes (Signed)
@Patient  ID: [name], female    DOB: 1951/07/13, 70 y.o.   MRN: [id number]  Chief Complaint  Patient presents with  . Follow-up    Pt states she has been doing okay since last visit. States she still has problems with SOB that can happen at any time. Denies any complaints of cough or chest discomfort. States she will have some occ wheezing.    Referring provider: Lorrene Reid, PA-C  HPI: 70 year old female, former smoker.  Medical history significant for COPD mixed type, cardiomyopathy, GERD, IBS, osteoporosis, breast cancer, anemia due to B12 deficiency, anxiety/depression, insomnia.  Patient of Dr. Annamaria Boots, in for initial consult on 05/18/2019.   09/06/2020 Patient presents today for overdue follow-up COPD/emphysema. She was given sample of Trelegy 100 during last office visit and has remain on this since. She reports some improvement but continues to experience dyspnea with activity and wheezing. She also reports long standing nasal congestion, states that she can not use nasal steriod sprays. She has not yet received Covid booster and declined influenza vaccine today. She is open to pulmonary rehab.    Allergies  Allergen Reactions  . Amoxicillin-Pot Clavulanate     diarrhea  . Doxycycline     Nausea & vomiting  . Venlafaxine     ? Reaction; ? Blurred vision    Immunization History  Administered Date(s) Administered  . Influenza Whole 07/14/2007, 05/31/2012  . Influenza, High Dose Seasonal PF 07/18/2018  . PFIZER SARS-COV-2 Vaccination 10/30/2019, 11/26/2019  . Td 04/01/1998  . Tdap 10/29/2011    Past Medical History:  Diagnosis Date  . Anemia    B12 deficient  . B12 deficiency   . Breast cancer ([place])    left breast.  . Chronic SI joint pain    Bilateral, Dr. Estanislado Pandy  . Fibromyalgia    Dr. Estanislado Pandy  . GERD (gastroesophageal reflux disease)   . Greater trochanteric bursitis of both hips    Dr. Estanislado Pandy  . Hyperlipidemia   . IBS (irritable bowel  syndrome)   . LV dysfunction    iatrogenic from chemotherapy ; on Carvedilol   . Migraines   . Osteoporosis    Dr Lomax---> transferring to a new gyn    Tobacco History: Social History   Tobacco Use  Smoking Status Former Smoker  . Packs/day: 1.00  . Years: 30.00  . Pack years: 30.00  . Types: Cigarettes  . Quit date: 08/31/1990  . Years since quitting: 30.0  Smokeless Tobacco Never Used   Counseling given: Not Answered   Outpatient Medications Prior to Visit  Medication Sig Dispense Refill  . albuterol (PROAIR HFA) 108 (90 Base) MCG/ACT inhaler Inhale 1-2 puffs into the lungs every 6 (six) hours as needed for wheezing or shortness of breath. RF per PULM 16 g 0  . Calcium Carbonate-Vitamin D 600-400 MG-UNIT tablet Take 1 tablet by mouth 2 (two) times daily. 60 tablet 11  . cetirizine (ZYRTEC) 10 MG tablet Take 10 mg by mouth daily.    . citalopram (CELEXA) 40 MG tablet 1po qd Filled by Malachi Paradise-  Nurse Practitioner, [facility] 5 tablet 0  . hydrOXYzine (ATARAX/VISTARIL) 10 MG tablet Take 10 mg by mouth 2 (two) times daily.    . methocarbamol (ROBAXIN) 750 MG tablet TAKE 1 TABLET BY MOUTH 2 TIMES DAILY AS NEEDED FOR MUSCLE SPASMS (NON FORMULARY) 60 tablet 0  . Probiotic Product (PROBIOTIC PO) Take 1 tablet by mouth daily.    . Vitamin D, Ergocalciferol, (DRISDOL)  1.25 MG (50000 UNIT) CAPS capsule TAKE 1 CAPSULE BY MOUTH EVERY 7 DAYS 12 capsule 1  . TRELEGY ELLIPTA 100-62.5-25 MCG/INH AEPB INHALE 1 PUFF INTO THE LUNGS DAILY *RINSE MOUTH 60 each 2  . fluconazole (DIFLUCAN) 100 MG tablet Take 1 tablet (100 mg total) by mouth daily. 7 tablet 0  . Risedronate Sodium 35 MG TBEC Take 1 tablet (35 mg total) by mouth once a week. 12 tablet 0   No facility-administered medications prior to visit.    Review of Systems  Review of Systems  Constitutional: Negative.   HENT: Positive for congestion and postnasal drip.        Nasal congestion   Respiratory: Positive for  shortness of breath and wheezing. Negative for cough and chest tightness.   Cardiovascular: Negative.     Physical Exam  BP (!) 100/58 (BP Location: Right Arm, Cuff Size: Normal)   Pulse 76   Temp 98.7 F (37.1 C) (Other (Comment)) Comment (Src): wrist  Ht 5' (1.524 m)   Wt 86 lb 6.4 oz (39.2 kg)   SpO2 98%   BMI 16.87 kg/m  Physical Exam Constitutional:      Appearance: Normal appearance.  HENT:     Mouth/Throat:     Comments: Deferred d/t masking Cardiovascular:     Rate and Rhythm: Normal rate and regular rhythm.  Pulmonary:     Effort: Pulmonary effort is normal.     Breath sounds: Normal breath sounds.  Musculoskeletal:        General: Normal range of motion.  Skin:    General: Skin is warm and dry.  Neurological:     General: No focal deficit present.     Mental Status: She is alert and oriented to person, place, and time. Mental status is at baseline.  Psychiatric:        Mood and Affect: Mood normal.        Behavior: Behavior normal.        Thought Content: Thought content normal.        Judgment: Judgment normal.      Lab Results:  CBC    Component Value Date/Time   WBC 3.9 06/28/2019 1024   WBC 5.3 09/21/2017 0510   RBC 4.00 06/28/2019 1024   RBC 3.44 (L) 09/21/2017 0510   HGB 12.1 06/28/2019 1024   HGB 11.7 07/10/2014 1004   HCT 35.8 06/28/2019 1024   HCT 35.7 07/10/2014 1004   PLT 168 06/28/2019 1024   MCV 90 06/28/2019 1024   MCV 90.4 07/10/2014 1004   MCH 30.3 06/28/2019 1024   MCH 30.2 09/21/2017 0510   MCHC 33.8 06/28/2019 1024   MCHC 32.6 09/21/2017 0510   RDW 12.3 06/28/2019 1024   RDW 13.3 07/10/2014 1004   LYMPHSABS 1.2 06/28/2019 1024   LYMPHSABS 1.6 07/10/2014 1004   MONOABS 0.8 09/20/2017 1432   MONOABS 0.4 07/10/2014 1004   EOSABS 0.1 06/28/2019 1024   BASOSABS 0.0 06/28/2019 1024   BASOSABS 0.0 07/10/2014 1004    BMET    Component Value Date/Time   NA 141 06/28/2019 1024   NA 140 07/10/2014 1005   K 4.2 06/28/2019  1024   K 3.6 07/10/2014 1005   CL 101 06/28/2019 1024   CL 102 09/27/2012 0936   CO2 27 06/28/2019 1024   CO2 28 07/10/2014 1005   GLUCOSE 81 06/28/2019 1024   GLUCOSE 103 (H) 09/22/2017 0506   GLUCOSE 123 07/10/2014 1005   GLUCOSE 96 09/27/2012 0936  BUN 15 06/28/2019 1024   BUN 17.7 07/10/2014 1005   CREATININE 0.86 06/28/2019 1024   CREATININE 0.87 11/24/2016 1059   CREATININE 1.0 07/10/2014 1005   CALCIUM 9.7 06/28/2019 1024   CALCIUM 9.3 07/10/2014 1005   GFRNONAA 70 06/28/2019 1024   GFRNONAA 70 11/24/2016 1059   GFRAA 80 06/28/2019 1024   GFRAA 80 11/24/2016 1059    BNP No results found for: BNP  ProBNP    Component Value Date/Time   PROBNP 72.0 05/08/2009 0000    Imaging: No results found.   Assessment & Plan:   COPD mixed type ([place]) - Patient was start on Trelegy 100 at last visit, she reports some improvement but still experiences moderate dyspnea on exertion and wheezing. She has significant reversibility on PFTs. Plan increased Trelegy to 200 (Sample given and RX sent). Patient needs covid -19 booster, declined influenza vaccine. Needs Alpha-1 testing at next visit. FU in 6 months with Dr. Annamaria Boots or APP  Nasal congestion - Recommend ocean saline nasal spray twice daily - If not better, would refer to ENT   Martyn Ehrich, NP 09/06/2020

## 2020-09-06 NOTE — Assessment & Plan Note (Signed)
-   Recommend ocean saline nasal spray twice daily - If not better, would refer to ENT

## 2020-09-09 ENCOUNTER — Encounter (HOSPITAL_COMMUNITY): Payer: Self-pay | Admitting: *Deleted

## 2020-09-09 NOTE — Progress Notes (Signed)
Received referral from Dr. Annamaria Boots for this pt to participate in pulmonary rehab with the the diagnosis of Emphysema. Clinical review of pt follow up appt on 09/06/20 with Geraldo Pitter NP with Dr. Annamaria Boots Pulmonary office note.  Reviewed PFT from 02/2017 which demonstrated COPD stage II.  Pt with Covid Risk Score - 3. Pt appropriate for scheduling for Pulmonary rehab.  Will forward to support staff for scheduling and verification of insurance eligibility/benefits with pt consent. Cherre Huger, BSN Cardiac and Training and development officer

## 2020-09-12 ENCOUNTER — Telehealth (HOSPITAL_COMMUNITY): Payer: Self-pay

## 2020-09-12 NOTE — Telephone Encounter (Signed)
Pt insurance is active and benefits verified through [facility] Medicare Co-pay $20, DED 0/0 met, out of pocket $3,600/0 met, co-insurance 0%. no pre-authorization required. Passport, [employer] 09/12/2020@2 :56pm, REF# [id number]

## 2020-10-08 ENCOUNTER — Encounter (HOSPITAL_COMMUNITY): Payer: Self-pay

## 2020-10-08 NOTE — Telephone Encounter (Signed)
Attempted to call patient in regards to Pulmonary Rehab - LM on VM Mailed letter 

## 2020-10-18 ENCOUNTER — Telehealth: Payer: Self-pay | Admitting: Internal Medicine

## 2020-10-18 ENCOUNTER — Ambulatory Visit: Payer: Medicare Other | Admitting: Physician Assistant

## 2020-10-18 ENCOUNTER — Encounter: Payer: Self-pay | Admitting: Physician Assistant

## 2020-10-18 ENCOUNTER — Other Ambulatory Visit: Payer: Self-pay

## 2020-10-18 VITALS — BP 114/65 | HR 72 | Resp 12 | Ht 60.0 in | Wt 86.2 lb

## 2020-10-18 DIAGNOSIS — M5136 Other intervertebral disc degeneration, lumbar region: Secondary | ICD-10-CM

## 2020-10-18 DIAGNOSIS — M7062 Trochanteric bursitis, left hip: Secondary | ICD-10-CM

## 2020-10-18 DIAGNOSIS — Z8639 Personal history of other endocrine, nutritional and metabolic disease: Secondary | ICD-10-CM

## 2020-10-18 DIAGNOSIS — M19041 Primary osteoarthritis, right hand: Secondary | ICD-10-CM

## 2020-10-18 DIAGNOSIS — M19042 Primary osteoarthritis, left hand: Secondary | ICD-10-CM

## 2020-10-18 DIAGNOSIS — M7711 Lateral epicondylitis, right elbow: Secondary | ICD-10-CM | POA: Diagnosis not present

## 2020-10-18 DIAGNOSIS — G4709 Other insomnia: Secondary | ICD-10-CM | POA: Diagnosis not present

## 2020-10-18 DIAGNOSIS — Z8669 Personal history of other diseases of the nervous system and sense organs: Secondary | ICD-10-CM

## 2020-10-18 DIAGNOSIS — M7061 Trochanteric bursitis, right hip: Secondary | ICD-10-CM

## 2020-10-18 DIAGNOSIS — M62838 Other muscle spasm: Secondary | ICD-10-CM | POA: Diagnosis not present

## 2020-10-18 DIAGNOSIS — Z8719 Personal history of other diseases of the digestive system: Secondary | ICD-10-CM

## 2020-10-18 DIAGNOSIS — M797 Fibromyalgia: Secondary | ICD-10-CM | POA: Diagnosis not present

## 2020-10-18 DIAGNOSIS — Z8739 Personal history of other diseases of the musculoskeletal system and connective tissue: Secondary | ICD-10-CM | POA: Diagnosis not present

## 2020-10-18 DIAGNOSIS — M5030 Other cervical disc degeneration, unspecified cervical region: Secondary | ICD-10-CM | POA: Diagnosis not present

## 2020-10-18 DIAGNOSIS — M810 Age-related osteoporosis without current pathological fracture: Secondary | ICD-10-CM | POA: Diagnosis not present

## 2020-10-18 DIAGNOSIS — Z8659 Personal history of other mental and behavioral disorders: Secondary | ICD-10-CM

## 2020-10-18 DIAGNOSIS — M51369 Other intervertebral disc degeneration, lumbar region without mention of lumbar back pain or lower extremity pain: Secondary | ICD-10-CM

## 2020-10-18 DIAGNOSIS — Z8709 Personal history of other diseases of the respiratory system: Secondary | ICD-10-CM

## 2020-10-18 DIAGNOSIS — M7712 Lateral epicondylitis, left elbow: Secondary | ICD-10-CM

## 2020-10-18 DIAGNOSIS — Z853 Personal history of malignant neoplasm of breast: Secondary | ICD-10-CM

## 2020-10-18 MED ORDER — TRIAMCINOLONE ACETONIDE 40 MG/ML IJ SUSP
10.0000 mg | INTRAMUSCULAR | Status: AC | PRN
  Administered 2020-10-18: 10 mg via INTRAMUSCULAR

## 2020-10-18 MED ORDER — LIDOCAINE HCL 1 % IJ SOLN
0.5000 mL | INTRAMUSCULAR | Status: AC | PRN
  Administered 2020-10-18: .5 mL

## 2020-10-18 MED ORDER — FLUCONAZOLE 150 MG PO TABS
150.0000 mg | ORAL_TABLET | Freq: Every day | ORAL | 0 refills | Status: DC
Start: 2020-10-18 — End: 2020-11-19

## 2020-10-18 NOTE — Patient Instructions (Signed)
Neck Exercises Ask your health care provider which exercises are safe for you. Do exercises exactly as told by your health care provider and adjust them as directed. It is normal to feel mild stretching, pulling, tightness, or discomfort as you do these exercises. Stop right away if you feel sudden pain or your pain gets worse. Do not begin these exercises until told by your health care provider. Neck exercises can be important for many reasons. They can improve strength and maintain flexibility in your neck, which will help your upper back and prevent neck pain. Stretching exercises Rotation neck stretching 1. Sit in a chair or stand up. 2. Place your feet flat on the floor, shoulder width apart. 3. Slowly turn your head (rotate) to the right until a slight stretch is felt. Turn it all the way to the right so you can look over your right shoulder. Do not tilt or tip your head. 4. Hold this position for 10-30 seconds. 5. Slowly turn your head (rotate) to the left until a slight stretch is felt. Turn it all the way to the left so you can look over your left shoulder. Do not tilt or tip your head. 6. Hold this position for 10-30 seconds. Repeat __________ times. Complete this exercise __________ times a day.   Neck retraction 1. Sit in a sturdy chair or stand up. 2. Look straight ahead. Do not bend your neck. 3. Use your fingers to push your chin backward (retraction). Do not bend your neck for this movement. Continue to face straight ahead. If you are doing the exercise properly, you will feel a slight sensation in your throat and a stretch at the back of your neck. 4. Hold the stretch for 1-2 seconds. Repeat __________ times. Complete this exercise __________ times a day. Strengthening exercises Neck press 1. Lie on your back on a firm bed or on the floor with a pillow under your head. 2. Use your neck muscles to push your head down on the pillow and straighten your spine. 3. Hold the position  as well as you can. Keep your head facing up (in a neutral position) and your chin tucked. 4. Slowly count to 5 while holding this position. Repeat __________ times. Complete this exercise __________ times a day. Isometrics These are exercises in which you strengthen the muscles in your neck while keeping your neck still (isometrics). 1. Sit in a supportive chair and place your hand on your forehead. 2. Keep your head and face facing straight ahead. Do not flex or extend your neck while doing isometrics. 3. Push forward with your head and neck while pushing back with your hand. Hold for 10 seconds. 4. Do the sequence again, this time putting your hand against the back of your head. Use your head and neck to push backward against the hand pressure. 5. Finally, do the same exercise on either side of your head, pushing sideways against the pressure of your hand. Repeat __________ times. Complete this exercise __________ times a day. Prone head lifts 1. Lie face-down (prone position), resting on your elbows so that your chest and upper back are raised. 2. Start with your head facing downward, near your chest. Position your chin either on or near your chest. 3. Slowly lift your head upward. Lift until you are looking straight ahead. Then continue lifting your head as far back as you can comfortably stretch. 4. Hold your head up for 5 seconds. Then slowly lower it to your starting position. Repeat  __________ times. Complete this exercise __________ times a day. Supine head lifts 1. Lie on your back (supine position), bending your knees to point to the ceiling and keeping your feet flat on the floor. 2. Lift your head slowly off the floor, raising your chin toward your chest. 3. Hold for 5 seconds. Repeat __________ times. Complete this exercise __________ times a day. Scapular retraction 1. Stand with your arms at your sides. Look straight ahead. 2. Slowly pull both shoulders (scapulae) backward  and downward (retraction) until you feel a stretch between your shoulder blades in your upper back. 3. Hold for 10-30 seconds. 4. Relax and repeat. Repeat __________ times. Complete this exercise __________ times a day. Contact a health care provider if:  Your neck pain or discomfort gets much worse when you do an exercise.  Your neck pain or discomfort does not improve within 2 hours after you exercise. If you have any of these problems, stop exercising right away. Do not do the exercises again unless your health care provider says that you can. Get help right away if:  You develop sudden, severe neck pain. If this happens, stop exercising right away. Do not do the exercises again unless your health care provider says that you can. This information is not intended to replace advice given to you by your health care provider. Make sure you discuss any questions you have with your health care provider. Document Revised: 06/15/2018 Document Reviewed: 06/15/2018 Elsevier Patient Education  2021 [place]. Hip Bursitis Rehab Ask your health care provider which exercises are safe for you. Do exercises exactly as told by your health care provider and adjust them as directed. It is normal to feel mild stretching, pulling, tightness, or discomfort as you do these exercises. Stop right away if you feel sudden pain or your pain gets worse. Do not begin these exercises until told by your health care provider. Stretching exercise This exercise warms up your muscles and joints and improves the movement and flexibility of your hip. This exercise also helps to relieve pain and stiffness. Iliotibial band stretch An iliotibial band is a strong band of muscle tissue that runs from the outer side of your hip to the outer side of your thigh and knee. 1. Lie on your side with your left / right leg in the top position. 2. Bend your left / right knee and grab your ankle. Stretch out your bottom arm to help you  balance. 3. Slowly bring your knee back so your thigh is behind your body. 4. Slowly lower your knee toward the floor until you feel a gentle stretch on the outside of your left / right thigh. If you do not feel a stretch and your knee will not fall farther, place the heel of your other foot on top of your knee and pull your knee down toward the floor with your foot. 5. Hold this position for __________ seconds. 6. Slowly return to the starting position. Repeat __________ times. Complete this exercise __________ times a day.   Strengthening exercises These exercises build strength and endurance in your hip and pelvis. Endurance is the ability to use your muscles for a long time, even after they get tired. Bridge This exercise strengthens the muscles that move your thigh backward (hip extensors). 1. Lie on your back on a firm surface with your knees bent and your feet flat on the floor. 2. Tighten your buttocks muscles and lift your buttocks off the floor until your trunk is  level with your thighs. ? Do not arch your back. ? You should feel the muscles working in your buttocks and the back of your thighs. If you do not feel these muscles, slide your feet 1-2 inches (2.5-5 cm) farther away from your buttocks. ? If this exercise is too easy, try doing it with your arms crossed over your chest. 3. Hold this position for __________ seconds. 4. Slowly lower your hips to the starting position. 5. Let your muscles relax completely after each repetition. Repeat __________ times. Complete this exercise __________ times a day.   Squats This exercise strengthens the muscles in front of your thigh and knee (quadriceps). 1. Stand in front of a table, with your feet and knees pointing straight ahead. You may rest your hands on the table for balance but not for support. 2. Slowly bend your knees and lower your hips like you are going to sit in a chair. ? Keep your weight over your heels, not over your  toes. ? Keep your lower legs upright so they are parallel with the table legs. ? Do not let your hips go lower than your knees. ? Do not bend lower than told by your health care provider. ? If your hip pain increases, do not bend as low. 3. Hold the squat position for __________ seconds. 4. Slowly push with your legs to return to standing. Do not use your hands to pull yourself to standing. Repeat __________ times. Complete this exercise __________ times a day. Hip hike 1. Stand sideways on a bottom step. Stand on your left / right leg with your other foot unsupported next to the step. You can hold on to the railing or wall for balance if needed. 2. Keep your knees straight and your torso square. Then lift your left / right hip up toward the ceiling. 3. Hold this position for __________ seconds. 4. Slowly let your left / right hip lower toward the floor, past the starting position. Your foot should get closer to the floor. Do not lean or bend your knees. Repeat __________ times. Complete this exercise __________ times a day. Single leg stand 1. Without shoes, stand near a railing or in a doorway. You may hold on to the railing or door frame as needed for balance. 2. Squeeze your left / right buttock muscles, then lift up your other foot. ? Do not let your left / right hip push out to the side. ? It is helpful to stand in front of a mirror for this exercise so you can watch your hip. 3. Hold this position for __________ seconds. Repeat __________ times. Complete this exercise __________ times a day. This information is not intended to replace advice given to you by your health care provider. Make sure you discuss any questions you have with your health care provider. Document Revised: 12/12/2018 Document Reviewed: 12/12/2018 Elsevier Patient Education  [place].

## 2020-10-18 NOTE — Telephone Encounter (Signed)
Called and spoke with pt and she is aware of CY recs.  dilfucan has been sent to the pharmacy and the pt is aware.  She voiced her understanding of CY recs.  Nothing further is needed.

## 2020-10-18 NOTE — Progress Notes (Signed)
Office Visit Note  Patient: [name]             Date of Birth: [birth year]           MRN: [id number]             PCP: Rachael Reid, PA-C Referring: Rachael Reid, PA-C Visit Date: 10/18/2020 Occupation: @GUAROCC @  Subjective:  Trapezius muscle spasms   History of Present Illness: [name] is a 70 y.o. female fibril myalgia, DDD, and osteoporosis.  Patient presents today with trapezius muscle tension and muscle tenderness bilaterally.  She has been experiencing muscle spasms intermittently over the past several weeks.  She takes Robaxin 750 mg twice daily as needed for muscle spasms which has been effective at alleviating her discomfort.  She is also been using a heating pad on a daily basis.  She continues to have ongoing discomfort due to trochanter bursitis of both hips.  She perform stretching exercises on a regular basis.  She continues to have difficulty sleeping at night and ongoing fatigue throughout the day.  She has had some increased discomfort in the left wrist.  She states that she previously had carpal tunnel release several years ago.  She has not been wearing her carpal tunnel night splints recently.  She denies any numbness in her hands at this time but states that the pain feels similar to when she had carpal tunnel syndrome.  She denies any joint swelling.    Activities of Daily Living:  Patient reports morning stiffness for 45-60 minutes.   Patient Reports nocturnal pain.  Difficulty dressing/grooming: Denies Difficulty climbing stairs: Denies Difficulty getting out of chair: Denies Difficulty using hands for taps, buttons, cutlery, and/or writing: Reports  Review of Systems  Constitutional: Positive for fatigue.  HENT: Negative for mouth sores, mouth dryness and nose dryness.   Eyes: Negative for pain, itching and dryness.  Respiratory: Negative for shortness of breath and difficulty breathing.   Cardiovascular: Negative for chest pain and  palpitations.  Gastrointestinal: Negative for blood in stool, constipation and diarrhea.  Endocrine: Negative for increased urination.  Genitourinary: Negative for difficulty urinating.  Musculoskeletal: Positive for arthralgias, joint pain, myalgias, morning stiffness, muscle tenderness and myalgias. Negative for joint swelling.  Skin: Negative for color change, rash and redness.  Allergic/Immunologic: Negative for susceptible to infections.  Neurological: Positive for headaches. Negative for dizziness, numbness, memory loss and weakness.  Hematological: Negative for bruising/bleeding tendency.  Psychiatric/Behavioral: Positive for sleep disturbance. Negative for confusion.    PMFS History:  Patient Active Problem List   Diagnosis Date Noted  . Nasal congestion 09/06/2020  . History of thrush 09/22/2019  . Estrogen deficiency 06/28/2019  . Post-menopausal 06/28/2019  . Mood disorder ([place]) 03/17/2018  . Osteopenia after menopause 11/06/2017  . Medial epicondylitis of right elbow 10/14/2017  . MDD (major depressive disorder), severe ([place]) 09/22/2017  . MDD (major depressive disorder), recurrent episode, moderate ([place])   . Prolonged Q-T interval on ECG 09/20/2017  . Overdose 09/20/2017  . Acute cystitis with hematuria   . Suicide attempt ([place])   . Family history of diabetes mellitus- mom in 50's 08-20-17    Class: Chronic  . Family history of colon cancer- sister died 37yo 08/20/2017  . Adjustment disorder with mixed anxiety < depressed mood 2017/08/20  . B12 deficiency 2017-08-20  . Fibromyalgia syndrome 11/23/2016  . Other insomnia 11/23/2016  . Trapezius muscle spasm 11/23/2016  . DDD (degenerative disc disease), cervical 11/23/2016  . DDD (degenerative  disc disease), lumbar 11/23/2016  . Other idiopathic scoliosis, lumbar region 11/23/2016  . Malignant neoplasm of female breast ([place]) 11/23/2016  . History of gastroesophageal reflux (GERD) 11/23/2016  . PCP NOTES  >>>>>>>>>>>>>>>>>>>> 11/21/2015  . Constipation 02/15/2015  . Annual physical exam 11/20/2013  . COPD mixed type ([place]) 11/20/2013  . Unspecified adverse effect of unspecified drug, medicinal and biological substance 12/10/2011  . Vitamin D deficiency 07/06/2011  . CARDIOMYOPATHY, PRIMARY, DILATED 01/01/2009  . BREAST CANCER, HX OF 11/02/2008  . h/o HYPERLIPIDEMIA 10/18/2007  . GERD 10/18/2007  . ANEMIA, B12 DEFICIENCY 10/12/2007  . Anxiety,depression, insomnia 10/12/2007  . IBS 10/12/2007  . Primary osteoarthritis of both hands 10/12/2007  . Osteoporosis 10/12/2007  . COLONIC POLYPS, HX OF 10/12/2007  . MIGRAINES, HX OF 10/12/2007  . Fibromyalgia--Dr. Herold Harms, on Ultram 07/14/2007    Past Medical History:  Diagnosis Date  . Anemia    B12 deficient  . B12 deficiency   . Breast cancer ([place])    left breast.  . Chronic SI joint pain    Bilateral, Dr. Estanislado Pandy  . Fibromyalgia    Dr. Estanislado Pandy  . GERD (gastroesophageal reflux disease)   . Greater trochanteric bursitis of both hips    Dr. Estanislado Pandy  . Hyperlipidemia   . IBS (irritable bowel syndrome)   . LV dysfunction    iatrogenic from chemotherapy ; on Carvedilol   . Migraines   . Osteoporosis    Dr Lomax---> transferring to a new gyn    Family History  Problem Relation Age of Onset  . Diabetes Mother   . Breast cancer Mother   . Depression Mother        anxiety  . Heart disease Mother        in her 9s  . Colon cancer Sister   . Healthy Daughter   . Stroke Neg Hx    Past Surgical History:  Procedure Laterality Date  . ABDOMINAL HYSTERECTOMY     BSO for  Endometriosis  . CARPAL TUNNEL RELEASE     left  . CERVICAL DISC SURGERY    . COLONOSCOPY W/ POLYPECTOMY  1999   Dr Earlean Shawl  . ESOPHAGEAL DILATION  2005   Dr Earlean Shawl  . MASTECTOMY  2009   bilateral, Dr Margot Chimes  . SEPTOPLASTY     Social History   Social History Narrative   Lives w/ husband   Immunization History  Administered Date(s) Administered   . Influenza Whole 07/14/2007, 05/31/2012  . Influenza, High Dose Seasonal PF 07/18/2018  . PFIZER(Purple Top)SARS-COV-2 Vaccination 10/30/2019, 11/26/2019  . Td 04/01/1998  . Tdap 10/29/2011     Objective: Vital Signs: BP 114/65 (BP Location: Right Arm, Patient Position: Sitting, Cuff Size: Normal)   Pulse 72   Resp 12   Ht 5' (1.524 m)   Wt 86 lb 3.2 oz (39.1 kg)   BMI 16.83 kg/m    Physical Exam Vitals and nursing note reviewed.  Constitutional:      Appearance: She is well-developed and well-nourished.  HENT:     Head: Normocephalic and atraumatic.  Eyes:     Extraocular Movements: EOM normal.     Conjunctiva/sclera: Conjunctivae normal.  Cardiovascular:     Pulses: Intact distal pulses.  Pulmonary:     Effort: Pulmonary effort is normal.     Breath sounds: Normal breath sounds.  Abdominal:     Palpations: Abdomen is soft.  Musculoskeletal:     Cervical back: Normal range of motion.  Skin:  General: Skin is warm and dry.     Capillary Refill: Capillary refill takes less than 2 seconds.  Neurological:     Mental Status: She is alert and oriented to person, place, and time.  Psychiatric:        Mood and Affect: Mood and affect normal.        Behavior: Behavior normal.      Musculoskeletal Exam: Generalized hyperalgesia and positive tender points. C-spine limited ROM with lateral rotation and flexion.  Trapezius muscle tension and muscle tenderness bilaterally.  Thoracic and lumbar spine good ROM.  Shoulder joints, elbow joints, wrist joints, MCPs, PIPs, and DIPs good ROM with no synovitis.  Complete fist formation bilaterally.  Hip joints good ROM with no discomfort.  Tenderness over bilateral trochanteric bursa bilaterally.  Knee joints good ROM with no warmth or effusion. Ankle joints good ROM with no tenderness or inflammation.    CDAI Exam: CDAI Score: -- Patient Global: --; Provider Global: -- Swollen: --; Tender: -- Joint Exam 10/18/2020   No joint exam  has been documented for this visit   There is currently no information documented on the homunculus. Go to the Rheumatology activity and complete the homunculus joint exam.  Investigation: No additional findings.  Imaging: No results found.  Recent Labs: Lab Results  Component Value Date   WBC 3.9 06/28/2019   HGB 12.1 06/28/2019   PLT 168 06/28/2019   NA 141 06/28/2019   K 4.2 06/28/2019   CL 101 06/28/2019   CO2 27 06/28/2019   GLUCOSE 81 06/28/2019   BUN 15 06/28/2019   CREATININE 0.86 06/28/2019   BILITOT 0.3 06/28/2019   ALKPHOS 57 06/28/2019   AST 32 06/28/2019   ALT 16 06/28/2019   PROT 6.6 06/28/2019   ALBUMIN 4.4 06/28/2019   CALCIUM 9.7 06/28/2019   GFRAA 80 06/28/2019    Speciality Comments: Narcotic Agreement Broken- [place] found on last UDS.   Procedures:  Trigger Point Inj  Date/Time: 10/18/2020 9:40 AM Performed by: Ofilia Neas, PA-C Authorized by: Ofilia Neas, PA-C   Consent Given by:  Patient Site marked: the procedure site was marked   Timeout: prior to procedure the correct patient, procedure, and site was verified   Indications:  Pain Total # of Trigger Points:  2 Location: neck   Needle Size:  27 G Approach:  Dorsal Medications #1:  0.5 mL lidocaine 1 %; 10 mg triamcinolone acetonide 40 MG/ML Medications #2:  0.5 mL lidocaine 1 %; 10 mg triamcinolone acetonide 40 MG/ML Patient tolerance:  Patient tolerated the procedure well with no immediate complications   Allergies: Amoxicillin-pot clavulanate, Doxycycline, and Venlafaxine   Assessment / Plan:     Visit Diagnoses: Fibromyalgia: She has generalized hyperalgesia and positive tender points on exam.  She has been experiencing increased trapezius muscle tension and muscle tenderness over the past several weeks.  She has had muscle spasms intermittently.  She takes methocarbamol 750 mg twice daily as needed for muscle spasms and has been using a heating pad on a daily basis.  She  continues to have discomfort due to trochanter bursitis of both hips.  She was encouraged to perform stretching exercises on a daily basis.  She was given a handout of these exercises to perform.  We discussed the importance of regular exercise and good sleep hygiene.  She will follow-up in the office in 3 months.   Trapezius muscle spasm -She presents today with trapezius muscle tension and muscle tenderness bilaterally.  She has been experiencing muscle spasms intermittently over the past several weeks.  She has been using a heating pad on a daily basis to try to alleviate some of her discomfort.  She is also tried taking methocarbamol 750 mg 1 tablet twice daily as needed for muscle spasms.  She requested bilateral trigger point injections today.  She tolerated procedure well. She noticed almost immediate relief after the injections in the office today. Procedure note was completed above.  Aftercare was discussed.  She was given a handout of neck exercises to perform.  She was advised to notify us if her discomfort persists or worsens.  Plan: Trigger Point Inj  DDD (degenerative disc disease), cervical: She has limited range of motion with lateral rotation bilaterally as well as with flexion of the C-spine.  She has chronic neck pain and stiffness.  No symptoms of radiculopathy. She has trapezius muscle tension and muscle tenderness bilaterally.  She has been experiencing muscle spasms over the past several weeks.  She requested bilateral trapezius trigger point injections today.  We discussed the importance of performing neck exercises on a daily basis.  She was given a handout of exercises to perform.  DDD (degenerative disc disease), lumbar: Chronic pain.  No symptoms of radiculopathy.  She was encouraged to perform lower back stretching and strengthening exercises.   Lateral epicondylitis of both elbows: She has mild tenderness to palpation bilaterally.   Trochanteric bursitis of both hips: She has  tenderness to palpation over bilateral trochanteric bursa.  She experiences discomfort when lying on her sides at night.  She has interrupted sleep at night due to nocturnal pain.  She was encouraged to perform stretching exercises on a daily basis.  She was given a handout of these exercises to perform.  Primary osteoarthritis of both hands: She has PIP and DIP thickening consistent with osteoarthritis of both hands.  No joint tenderness or synovitis was noted.  She does have an active complete fist bilaterally.  She has been experiencing some stiffness and aching in the left wrist joint over the past several weeks.  She has not been experiencing any paresthesias in the left hand, but she feels that the discomfort she has been experiencing is similar to when she had carpal tunnel syndrome.  She was encouraged to start wearing her carpal tunnel night splint again.  She can also wear arthritis compression gloves for symptomatic relief.   Age-related osteoporosis without current pathological fracture: DEXA on 01/01/2020: The BMD measured at [facility] Neck Right is 0.609 g/cm2 with a T-score of -3.1.  She has not had any recent falls or fractures.  She is taking calcium and vitamin D supplement daily.  History of vitamin D deficiency: She is taking calcium and vitamin D supplement on a daily basis.  Other insomnia: She continues to have difficulty sleeping at night due to nocturnal pain.  We discussed the importance of good sleep hygiene.   Other medical conditions are listed as follows:  History of IBS  History of scoliosis  History of gastroesophageal reflux (GERD)  History of migraine  History of COPD  History of depression  History of anxiety  History of hyperlipidemia  History of breast cancer  Orders: Orders Placed This Encounter  Procedures  . Trigger Point Inj   No orders of the defined types were placed in this encounter.     Follow-Up Instructions: Return in about 3 months  (around 01/15/2021) for Fibromyalgia, DDD.   Ofilia Neas, PA-C  Note -  This record has been created using [employer].  Chart creation errors have been sought, but may not always  have been located. Such creation errors do not reflect on  the standard of medical care.

## 2020-10-18 NOTE — Telephone Encounter (Signed)
Called and spoke with patient, states she thinks the has thrush, she uses Trelegy inhaler.  She always rinses 3 times, brushes her teeth and tongue with her tooth brush and uses mouth wash.  She feels like she is easy to get thrush.  She has white patches on her tongue, sides of her mouth on the inside and the back of her throat.  Dr. Annamaria Boots, Please advise.  Thank you.

## 2020-10-18 NOTE — Telephone Encounter (Signed)
We can offer Diflucan 150 mg, # 7, 1 daily Next, suggest she use her Trelegy right before a meal. Chewing and eating can mechanically help clear the mouth.  If the problem keeps coming back, we will work out something else.

## 2020-10-24 ENCOUNTER — Other Ambulatory Visit: Payer: Self-pay | Admitting: Physician Assistant

## 2020-10-24 ENCOUNTER — Telehealth (HOSPITAL_COMMUNITY): Payer: Self-pay

## 2020-10-24 NOTE — Telephone Encounter (Signed)
Last Visit: 10/18/2020 Next Visit: 01/07/2021  Current Dose per office note on 10/18/2020: methocarbamol 750 mg twice daily as needed for muscle spasms  Dx:  Fibromyalgia  Last Fill: 05/17/2020  Okay to refill Methocarbamol?

## 2020-10-24 NOTE — Telephone Encounter (Signed)
No response from pt.  Closed referral  

## 2020-11-18 ENCOUNTER — Telehealth: Payer: Self-pay | Admitting: Internal Medicine

## 2020-11-18 MED ORDER — AEROCHAMBER MV MISC
0 refills | Status: DC
Start: 2020-11-18 — End: 2022-12-30

## 2020-11-18 MED ORDER — BREZTRI AEROSPHERE 160-9-4.8 MCG/ACT IN AERO
2.0000 | INHALATION_SPRAY | Freq: Two times a day (BID) | RESPIRATORY_TRACT | 0 refills | Status: DC
Start: 2020-11-18 — End: 2021-03-18

## 2020-11-18 NOTE — Telephone Encounter (Signed)
Ok to send diflucan 150 mg, # 7, 1 daily  Ok to wait to start Breztri until thrush clears

## 2020-11-18 NOTE — Telephone Encounter (Signed)
Checked the formulary, it does appear that [name] is covered. Called and spoke with patient. I advised about the Breztri. She wishes to go ahead and try this. I have placed 2 samples of Breztri with a spacer at the front desk. She is aware to ask for someone in triage to show her how to use the spacer.   She still wanted to know if Dr Annamaria Boots would be willing to send in some Diflucan for her. She also wanted to know if she should be using an inhaler while dealing with thrush.   Dr. Annamaria Boots, can you please advise? Thanks!

## 2020-11-18 NOTE — Telephone Encounter (Signed)
Called and spoke with patient. She stated that she has developed thrush again from using the Trelegy 200 inhaler. She is rinsing her mouth out 3 times plus brushing her teeth and rinsing again after using the Trelegy. She had called last month and was prescribed Diflucan. She stated that this worked for her but the thrush keeps coming back. She denied any breathing difficulties or feeling like her throat was about to close.   Pharmacy is [facility] on [address].

## 2020-11-18 NOTE — Telephone Encounter (Signed)
Can we work with her to see if insurance covers Breztri? Breztri could be used with a spacer tube.

## 2020-11-19 MED ORDER — FLUCONAZOLE 150 MG PO TABS: 150.0000 mg | ORAL_TABLET | Freq: Every day | ORAL | 0 refills | Status: DC

## 2020-11-19 NOTE — Telephone Encounter (Signed)
Called and spoke with Patient. Dr. Janee Morn recommendations given. Diflucan prescription sent to requested [facility] pharmacy. Nothing further at this time.

## 2020-11-19 NOTE — Telephone Encounter (Signed)
Pt returning a phone call. Pt can be reached at [phone].

## 2020-11-19 NOTE — Telephone Encounter (Signed)
Left message for patient to call back  

## 2020-12-24 NOTE — Progress Notes (Deleted)
Office Visit Note  Patient: [name]             Date of Birth: 03-28-1951           MRN: [id number]             PCP: Lorrene Reid, PA-C Referring: Lorrene Reid, PA-C Visit Date: 01/07/2021 Occupation: @GUAROCC @  Subjective:  No chief complaint on file.   History of Present Illness: [name] is a 71 y.o. female ***   Activities of Daily Living:  Patient reports morning stiffness for *** {minute/hour:19697}.   Patient {ACTIONS;DENIES/REPORTS:21021675::"Denies"} nocturnal pain.  Difficulty dressing/grooming: {ACTIONS;DENIES/REPORTS:21021675::"Denies"} Difficulty climbing stairs: {ACTIONS;DENIES/REPORTS:21021675::"Denies"} Difficulty getting out of chair: {ACTIONS;DENIES/REPORTS:21021675::"Denies"} Difficulty using hands for taps, buttons, cutlery, and/or writing: {ACTIONS;DENIES/REPORTS:21021675::"Denies"}  No Rheumatology ROS completed.   PMFS History:  Patient Active Problem List   Diagnosis Date Noted  . Nasal congestion 09/06/2020  . History of thrush 09/22/2019  . Estrogen deficiency 06/28/2019  . Post-menopausal 06/28/2019  . Mood disorder ([place]) 03/17/2018  . Osteopenia after menopause 11/06/2017  . Medial epicondylitis of right elbow 10/14/2017  . MDD (major depressive disorder), severe ([place]) 09/22/2017  . MDD (major depressive disorder), recurrent episode, moderate ([place])   . Prolonged Q-T interval on ECG 09/20/2017  . Overdose 09/20/2017  . Acute cystitis with hematuria   . Suicide attempt ([place])   . Family history of diabetes mellitus- mom in 50's 08/07/2017    Class: Chronic  . Family history of colon cancer- sister died 82yo 08-07-2017  . Adjustment disorder with mixed anxiety < depressed mood 08/07/17  . B12 deficiency 08-07-2017  . Fibromyalgia syndrome 11/23/2016  . Other insomnia 11/23/2016  . Trapezius muscle spasm 11/23/2016  . DDD (degenerative disc disease), cervical 11/23/2016  . DDD (degenerative disc disease), lumbar  11/23/2016  . Other idiopathic scoliosis, lumbar region 11/23/2016  . Malignant neoplasm of female breast ([place]) 11/23/2016  . History of gastroesophageal reflux (GERD) 11/23/2016  . PCP NOTES >>>>>>>>>>>>>>>>>>>> 11/21/2015  . Constipation 02/15/2015  . Annual physical exam 11/20/2013  . COPD mixed type ([place]) 11/20/2013  . Unspecified adverse effect of unspecified drug, medicinal and biological substance 12/10/2011  . Vitamin D deficiency 07/06/2011  . CARDIOMYOPATHY, PRIMARY, DILATED 01/01/2009  . BREAST CANCER, HX OF 11/02/2008  . h/o HYPERLIPIDEMIA 10/18/2007  . GERD 10/18/2007  . ANEMIA, B12 DEFICIENCY 10/12/2007  . Anxiety,depression, insomnia 10/12/2007  . IBS 10/12/2007  . Primary osteoarthritis of both hands 10/12/2007  . Osteoporosis 10/12/2007  . COLONIC POLYPS, HX OF 10/12/2007  . MIGRAINES, HX OF 10/12/2007  . Fibromyalgia--Dr. Herold Harms, on Ultram 07/14/2007    Past Medical History:  Diagnosis Date  . Anemia    B12 deficient  . B12 deficiency   . Breast cancer ([place])    left breast.  . Chronic SI joint pain    Bilateral, Dr. Estanislado Pandy  . Fibromyalgia    Dr. Estanislado Pandy  . GERD (gastroesophageal reflux disease)   . Greater trochanteric bursitis of both hips    Dr. Estanislado Pandy  . Hyperlipidemia   . IBS (irritable bowel syndrome)   . LV dysfunction    iatrogenic from chemotherapy ; on Carvedilol   . Migraines   . Osteoporosis    Dr Lomax---> transferring to a new gyn    Family History  Problem Relation Age of Onset  . Diabetes Mother   . Breast cancer Mother   . Depression Mother        anxiety  . Heart disease Mother  in her 14s  . Colon cancer Sister   . Healthy Daughter   . Stroke Neg Hx    Past Surgical History:  Procedure Laterality Date  . ABDOMINAL HYSTERECTOMY     BSO for  Endometriosis  . CARPAL TUNNEL RELEASE     left  . CERVICAL DISC SURGERY    . COLONOSCOPY W/ POLYPECTOMY  1999   Dr Earlean Shawl  . ESOPHAGEAL DILATION  2005   Dr  Earlean Shawl  . MASTECTOMY  2009   bilateral, Dr Margot Chimes  . SEPTOPLASTY     Social History   Social History Narrative   Lives w/ husband   Immunization History  Administered Date(s) Administered  . Influenza Whole 07/14/2007, 05/31/2012  . Influenza, High Dose Seasonal PF 07/18/2018  . PFIZER(Purple Top)SARS-COV-2 Vaccination 10/30/2019, 11/26/2019  . Td 04/01/1998  . Tdap 10/29/2011     Objective: Vital Signs: There were no vitals taken for this visit.   Physical Exam   Musculoskeletal Exam: ***  CDAI Exam: CDAI Score: -- Patient Global: --; Provider Global: -- Swollen: --; Tender: -- Joint Exam 01/07/2021   No joint exam has been documented for this visit   There is currently no information documented on the homunculus. Go to the Rheumatology activity and complete the homunculus joint exam.  Investigation: No additional findings.  Imaging: No results found.  Recent Labs: Lab Results  Component Value Date   WBC 3.9 06/28/2019   HGB 12.1 06/28/2019   PLT 168 06/28/2019   NA 141 06/28/2019   K 4.2 06/28/2019   CL 101 06/28/2019   CO2 27 06/28/2019   GLUCOSE 81 06/28/2019   BUN 15 06/28/2019   CREATININE 0.86 06/28/2019   BILITOT 0.3 06/28/2019   ALKPHOS 57 06/28/2019   AST 32 06/28/2019   ALT 16 06/28/2019   PROT 6.6 06/28/2019   ALBUMIN 4.4 06/28/2019   CALCIUM 9.7 06/28/2019   GFRAA 80 06/28/2019    Speciality Comments: Narcotic Agreement Broken- [place] found on last UDS.   Procedures:  No procedures performed Allergies: Amoxicillin-pot clavulanate, Doxycycline, and Venlafaxine   Assessment / Plan:     Visit Diagnoses: No diagnosis found.  Orders: No orders of the defined types were placed in this encounter.  No orders of the defined types were placed in this encounter.   Face-to-face time spent with patient was *** minutes. Greater than 50% of time was spent in counseling and coordination of care.  Follow-Up Instructions: No follow-ups  on file.   Earnestine Mealing, CMA  Note - This record has been created using Editor, commissioning.  Chart creation errors have been sought, but may not always  have been located. Such creation errors do not reflect on  the standard of medical care.

## 2021-01-06 ENCOUNTER — Telehealth: Payer: Self-pay | Admitting: Internal Medicine

## 2021-01-06 DIAGNOSIS — J449 Chronic obstructive pulmonary disease, unspecified: Secondary | ICD-10-CM

## 2021-01-06 MED ORDER — ALBUTEROL SULFATE HFA 108 (90 BASE) MCG/ACT IN AERS
1.0000 | INHALATION_SPRAY | Freq: Four times a day (QID) | RESPIRATORY_TRACT | 5 refills | Status: DC | PRN
Start: 2021-01-06 — End: 2022-03-09

## 2021-01-06 NOTE — Telephone Encounter (Signed)
Pt requesting albuterol hfa refill. This has been sent to preferred pharmacy. Pt also had questions about spacer, which I answered to the best of my ability. Nothing further needed at this time- will close encounter.

## 2021-01-07 ENCOUNTER — Ambulatory Visit: Payer: Medicare Other | Admitting: Rheumatology

## 2021-01-07 DIAGNOSIS — Z8639 Personal history of other endocrine, nutritional and metabolic disease: Secondary | ICD-10-CM

## 2021-01-07 DIAGNOSIS — M19041 Primary osteoarthritis, right hand: Secondary | ICD-10-CM

## 2021-01-07 DIAGNOSIS — M5136 Other intervertebral disc degeneration, lumbar region: Secondary | ICD-10-CM

## 2021-01-07 DIAGNOSIS — Z853 Personal history of malignant neoplasm of breast: Secondary | ICD-10-CM

## 2021-01-07 DIAGNOSIS — M797 Fibromyalgia: Secondary | ICD-10-CM

## 2021-01-07 DIAGNOSIS — Z8719 Personal history of other diseases of the digestive system: Secondary | ICD-10-CM

## 2021-01-07 DIAGNOSIS — Z8709 Personal history of other diseases of the respiratory system: Secondary | ICD-10-CM

## 2021-01-07 DIAGNOSIS — G4709 Other insomnia: Secondary | ICD-10-CM

## 2021-01-07 DIAGNOSIS — Z8659 Personal history of other mental and behavioral disorders: Secondary | ICD-10-CM

## 2021-01-07 DIAGNOSIS — Z8669 Personal history of other diseases of the nervous system and sense organs: Secondary | ICD-10-CM

## 2021-01-07 DIAGNOSIS — M62838 Other muscle spasm: Secondary | ICD-10-CM

## 2021-01-07 DIAGNOSIS — Z8739 Personal history of other diseases of the musculoskeletal system and connective tissue: Secondary | ICD-10-CM

## 2021-01-07 DIAGNOSIS — M810 Age-related osteoporosis without current pathological fracture: Secondary | ICD-10-CM

## 2021-01-07 DIAGNOSIS — M7061 Trochanteric bursitis, right hip: Secondary | ICD-10-CM

## 2021-01-07 DIAGNOSIS — M5030 Other cervical disc degeneration, unspecified cervical region: Secondary | ICD-10-CM

## 2021-01-07 DIAGNOSIS — M7711 Lateral epicondylitis, right elbow: Secondary | ICD-10-CM

## 2021-03-04 ENCOUNTER — Telehealth: Payer: Self-pay | Admitting: Internal Medicine

## 2021-03-04 NOTE — Telephone Encounter (Signed)
Called patient but she did not answer. Left message for patient to call back.  

## 2021-03-05 NOTE — Telephone Encounter (Signed)
Lmtcb for pt.  Pt has OV with CY on 7/7.

## 2021-03-05 NOTE — Progress Notes (Signed)
HPI- F former smoker (30 pk yrs) followed for COPD, complicated by Dilated Cardiomyopathy, COPD, IBS, GERD, Degenerative Disc Disease, Depression, Hx Breast Cancer/ Bilat mastectomy, no XRT, Chemo stopped due to cardiomyopathy, Fibromyalgia, PFT 03/31/17- Severe obstruction, Response to BD, DLCO mod reduced. FVC 2.20/ 83%, FEV1 1.06/ 52%, R 0.48, TLC 125%, DLCO 49%,   ==============================================================================   05/18/2019- 65 yoF former smoker for pulmonary evaluation. Medical problem list includes Dilated Cardiomyopathy, COPD, IBS, GERD, Degenerative Disc Disease, Depression, Hx Breast Cancer/ Bilat mastectomy, no XRT, Chemo stopped due to cardiomyopathy, Fibromyalgia,  Proair hfa, Symbicort 80, Inhalers not clearly helpful. Notes mainly DOE esp picking up and carrying something heavy, or hurrying. Little cough or wheeze. Denies hx of pneumonia, Covid exposure, allergic rhinitis.  Cardiomyopathy limited chemotherapy but she doesn't  think it is an ongoing issue.  ENT+ septoplasty. No recent colds. Breathes ok while sleeping.  Retired from [employer], therapeutic work.  PFT 03/31/17- Severe obstruction, Response to BD, DLCO mod reduced. FVC 2.20/ 83%, FEV1 1.06/ 52%, R 0.48, TLC 125%, DLCO 49%,  Lab-10/04/17 Hgb 11.7 CXR 03/22/17-  FINDINGS: Cardiac and mediastinal contours remain within normal limits. Surgical clips project over the soft tissues of the right and left chest as well of the left axilla. Bilateral breast reconstruction prostheses are present. The lungs are hyperinflated with increased lucency in the upper zones consistent with the clinical history of underlying emphysema. No focal airspace opacity, pleural effusion or pneumothorax. No suspicious pulmonary mass or nodule. Incompletely imaged cervicothoracic fusion hardware. No acute osseous abnormality. Dextroconvex scoliosis of the upper lumbar spine. IMPRESSION: Stable chest x-ray without evidence  of acute cardiopulmonary Process.  03/06/21- 56 yoF former smoker (30 pk yrs) followed for COPD, complicated by Dilated Cardiomyopathy, COPD, IBS, GERD, Degenerative Disc Disease, Depression, Hx Breast Cancer/ Bilat mastectomy, no XRT, Chemo stopped due to cardiomyopathy, Fibromyalgia,  --Proair hfa, Breztri,  Had recurrent thrush> Diflucan -----Not had albuterol in 3 days, more SOB with activities No obvious infection or acute event CXR 05/18/2019-  IMPRESSION: Marked hyperinflation and severe upper lobe predominant emphysema. Nodular opacity in the left lung base is stable from 0109, etiology uncertain. The patient may benefit from screening low-dose chest CT.  ROS-see HPI   + = positive Constitutional:    weight loss, night sweats, fevers, chills, fatigue, lassitude. HEENT:    headaches, difficulty swallowing, tooth/dental problems, sore throat,       sneezing, itching, ear ache, nasal congestion, post nasal drip, snoring CV:    chest pain, orthopnea, PND, swelling in lower extremities, anasarca,                                   dizziness, palpitations Resp:  + shortness of breath with exertion or at rest.                productive cough,   non-productive cough, coughing up of blood.              change in color of mucus.  wheezing.   Skin:    rash or lesions. GI:  No-   heartburn, indigestion, abdominal pain, nausea, vomiting, diarrhea,                 change in bowel habits, loss of appetite GU: dysuria, change in color of urine, no urgency or frequency.   flank pain. MS:   joint pain, stiffness, decreased range of motion, back pain.  Neuro-     nothing unusual Psych:  change in mood or affect.  depression or anxiety.   memory loss.  OBJ- Physical Exam General- Alert, Oriented, Affect-appropriate, Distress- none acute, petite Skin- rash-none, lesions- none, excoriation- none Lymphadenopathy- none Head- atraumatic            Eyes- Gross vision intact, PERRLA, conjunctivae and  secretions clear            Ears- Hearing, canals-normal            Nose- Clear, no-Septal dev, mucus, polyps, erosion, perforation             Throat- Mallampati II-III , mucosa clear , drainage- none, tonsils- atrophic Neck- flexible , trachea midline, no stridor , thyroid nl, carotid no bruit Chest - symmetrical excursion , unlabored           Heart/CV- RRR , no murmur , no gallop  , no rub, nl s1 s2                           - JVD- none , edema- none, stasis changes- none, varices- none           Lung- clear to P&A/ distant, wheeze- none, cough- none , dullness-none, rub- none           Chest wall- +bilateral reconstruction after breast cancer Abd-  Br/ Gen/ Rectal- Not done, not indicated Extrem- cyanosis- none, clubbing, none, atrophy- none, strength- nl Neuro- grossly intact to observation

## 2021-03-06 ENCOUNTER — Encounter: Payer: Self-pay | Admitting: Internal Medicine

## 2021-03-06 ENCOUNTER — Ambulatory Visit (INDEPENDENT_AMBULATORY_CARE_PROVIDER_SITE_OTHER): Payer: Medicare Other

## 2021-03-06 ENCOUNTER — Ambulatory Visit: Payer: Medicare Other | Admitting: Internal Medicine

## 2021-03-06 ENCOUNTER — Other Ambulatory Visit: Payer: Self-pay

## 2021-03-06 VITALS — BP 120/58 | HR 97 | Temp 97.6°F | Ht 60.0 in | Wt 83.0 lb

## 2021-03-06 DIAGNOSIS — J449 Chronic obstructive pulmonary disease, unspecified: Secondary | ICD-10-CM

## 2021-03-06 DIAGNOSIS — J439 Emphysema, unspecified: Secondary | ICD-10-CM | POA: Diagnosis not present

## 2021-03-06 MED ORDER — TRELEGY ELLIPTA 100-62.5-25 MCG/INH IN AEPB
1.0000 | INHALATION_SPRAY | Freq: Every day | RESPIRATORY_TRACT | 0 refills | Status: DC
Start: 2021-03-06 — End: 2021-03-18

## 2021-03-06 NOTE — Patient Instructions (Signed)
Order- CXR  dx COPD mixed type  Order- sample x 2 Trelegy 100 inhaler     Inhale 1 puff, then rinse well, once daily. Try using this just before a meal, either breakfast or supper. See if that helps reduce the risk of thrush in your mouth.   If the Trelegy works well, please call for prescription.

## 2021-03-06 NOTE — Telephone Encounter (Signed)
Pt seen today 03/06/21 by Dr. Annamaria Boots. Nothing further needed.

## 2021-03-10 ENCOUNTER — Other Ambulatory Visit: Payer: Self-pay

## 2021-03-10 ENCOUNTER — Encounter: Payer: Self-pay | Admitting: Physician Assistant

## 2021-03-10 ENCOUNTER — Ambulatory Visit: Payer: Medicare Other | Admitting: Physician Assistant

## 2021-03-10 VITALS — BP 116/71 | HR 76 | Ht 60.0 in | Wt 84.0 lb

## 2021-03-10 DIAGNOSIS — M810 Age-related osteoporosis without current pathological fracture: Secondary | ICD-10-CM

## 2021-03-10 DIAGNOSIS — Z8659 Personal history of other mental and behavioral disorders: Secondary | ICD-10-CM

## 2021-03-10 DIAGNOSIS — M19041 Primary osteoarthritis, right hand: Secondary | ICD-10-CM

## 2021-03-10 DIAGNOSIS — Z8739 Personal history of other diseases of the musculoskeletal system and connective tissue: Secondary | ICD-10-CM | POA: Diagnosis not present

## 2021-03-10 DIAGNOSIS — Z853 Personal history of malignant neoplasm of breast: Secondary | ICD-10-CM

## 2021-03-10 DIAGNOSIS — G4709 Other insomnia: Secondary | ICD-10-CM

## 2021-03-10 DIAGNOSIS — M51369 Other intervertebral disc degeneration, lumbar region without mention of lumbar back pain or lower extremity pain: Secondary | ICD-10-CM

## 2021-03-10 DIAGNOSIS — Z8639 Personal history of other endocrine, nutritional and metabolic disease: Secondary | ICD-10-CM | POA: Diagnosis not present

## 2021-03-10 DIAGNOSIS — Z8719 Personal history of other diseases of the digestive system: Secondary | ICD-10-CM | POA: Diagnosis not present

## 2021-03-10 DIAGNOSIS — M797 Fibromyalgia: Secondary | ICD-10-CM

## 2021-03-10 DIAGNOSIS — M7061 Trochanteric bursitis, right hip: Secondary | ICD-10-CM | POA: Diagnosis not present

## 2021-03-10 DIAGNOSIS — M7711 Lateral epicondylitis, right elbow: Secondary | ICD-10-CM

## 2021-03-10 DIAGNOSIS — M5030 Other cervical disc degeneration, unspecified cervical region: Secondary | ICD-10-CM | POA: Diagnosis not present

## 2021-03-10 DIAGNOSIS — M19042 Primary osteoarthritis, left hand: Secondary | ICD-10-CM

## 2021-03-10 DIAGNOSIS — M62838 Other muscle spasm: Secondary | ICD-10-CM

## 2021-03-10 DIAGNOSIS — M5136 Other intervertebral disc degeneration, lumbar region: Secondary | ICD-10-CM

## 2021-03-10 DIAGNOSIS — M7712 Lateral epicondylitis, left elbow: Secondary | ICD-10-CM

## 2021-03-10 DIAGNOSIS — M7062 Trochanteric bursitis, left hip: Secondary | ICD-10-CM

## 2021-03-10 DIAGNOSIS — Z8669 Personal history of other diseases of the nervous system and sense organs: Secondary | ICD-10-CM

## 2021-03-10 DIAGNOSIS — Z8709 Personal history of other diseases of the respiratory system: Secondary | ICD-10-CM

## 2021-03-10 MED ORDER — TRIAMCINOLONE ACETONIDE 40 MG/ML IJ SUSP
10.0000 mg | INTRAMUSCULAR | Status: AC | PRN
  Administered 2021-03-10: 10 mg via INTRAMUSCULAR

## 2021-03-10 MED ORDER — LIDOCAINE HCL 1 % IJ SOLN
0.5000 mL | INTRAMUSCULAR | Status: AC | PRN
  Administered 2021-03-10: .5 mL

## 2021-03-10 NOTE — Progress Notes (Signed)
Office Visit Note  Patient: [name]             Date of Birth: [birth year]           MRN: [id number]             PCP: Lorrene Reid, PA-C Referring: Lorrene Reid, PA-C Visit Date: 03/10/2021 Occupation: @GUAROCC @  Subjective:  Trapezius muscle spasms   History of Present Illness: [name] is a 70 y.o. female with history of fibromyalgia, DDD, and osteoporosis.  She presents today with trapezius muscle spasms bilaterally.  She requested trigger point injections today.  She states that her last injections performed on 10/18/2020 provided significant relief and lasted longer than they usually do she was able to postpone her office visits until now.  She has been taking methocarbamol 500 mg twice daily as needed for muscle spasms.  She continues to have intermittent discomfort due to trochanteric bursitis of both hips.  She performs stretching exercises on occasion.  She also walks on occasion for exercise.  She denies any recent falls or fractures.  She has been taking calcium and vitamin D daily.  She has occasional discomfort in her knee joints but denies any joint swelling.  She continues to experience stiffness in both hands but denies any inflammation.     Activities of Daily Living:  Patient reports morning stiffness for 1 hour.   Patient Reports nocturnal pain.  Difficulty dressing/grooming: Denies Difficulty climbing stairs: Denies Difficulty getting out of chair: Denies Difficulty using hands for taps, buttons, cutlery, and/or writing: Reports  Review of Systems  Constitutional:  Positive for fatigue.  HENT:  Negative for mouth sores, mouth dryness and nose dryness.   Eyes:  Negative for pain, visual disturbance and dryness.  Respiratory:  Negative for cough, hemoptysis, shortness of breath and difficulty breathing.   Cardiovascular:  Negative for chest pain, palpitations, hypertension and swelling in legs/feet.  Gastrointestinal:  Negative for blood in  stool, constipation and diarrhea.  Endocrine: Negative for increased urination.  Genitourinary:  Negative for painful urination.  Musculoskeletal:  Positive for joint pain, joint pain, myalgias, morning stiffness, muscle tenderness and myalgias. Negative for joint swelling and muscle weakness.  Skin:  Negative for color change, pallor, rash, hair loss, nodules/bumps, skin tightness, ulcers and sensitivity to sunlight.  Allergic/Immunologic: Negative for susceptible to infections.  Neurological:  Positive for numbness, headaches, parasthesias and weakness. Negative for dizziness.  Hematological:  Positive for bruising/bleeding tendency. Negative for swollen glands.  Psychiatric/Behavioral:  Positive for depressed mood and sleep disturbance. The patient is nervous/anxious.    PMFS History:  Patient Active Problem List   Diagnosis Date Noted   Nasal congestion 09/06/2020   History of thrush 09/22/2019   Estrogen deficiency 06/28/2019   Post-menopausal 06/28/2019   Mood disorder ([place]) 03/17/2018   Osteopenia after menopause 11/06/2017   Medial epicondylitis of right elbow 10/14/2017   MDD (major depressive disorder), severe ([place]) 09/22/2017   MDD (major depressive disorder), recurrent episode, moderate ([facility])    Prolonged Q-T interval on ECG 09/20/2017   Overdose 09/20/2017   Acute cystitis with hematuria    Suicide attempt [facility])    Family history of diabetes mellitus- mom in 50's Aug 26, 2017    Class: Chronic   Family history of colon cancer- sister died 45yo August 26, 2017   Adjustment disorder with mixed anxiety < depressed mood 2017/08/26   B12 deficiency 2017-08-26   Fibromyalgia syndrome 11/23/2016   Other insomnia 11/23/2016   Trapezius muscle spasm  11/23/2016   DDD (degenerative disc disease), cervical 11/23/2016   DDD (degenerative disc disease), lumbar 11/23/2016   Other idiopathic scoliosis, lumbar region 11/23/2016   Malignant neoplasm of female breast ([place]) 11/23/2016    History of gastroesophageal reflux (GERD) 11/23/2016   PCP NOTES >>>>>>>>>>>>>>>>>>>> 11/21/2015   Constipation 02/15/2015   Annual physical exam 11/20/2013   COPD mixed type ([place]) 11/20/2013   Unspecified adverse effect of unspecified drug, medicinal and biological substance 12/10/2011   Vitamin D deficiency 07/06/2011   CARDIOMYOPATHY, PRIMARY, DILATED 01/01/2009   BREAST CANCER, HX OF 11/02/2008   h/o HYPERLIPIDEMIA 10/18/2007   GERD 10/18/2007   ANEMIA, B12 DEFICIENCY 10/12/2007   Anxiety,depression, insomnia 10/12/2007   IBS 10/12/2007   Primary osteoarthritis of both hands 10/12/2007   Osteoporosis 10/12/2007   COLONIC POLYPS, HX OF 10/12/2007   MIGRAINES, HX OF 10/12/2007   Fibromyalgia--Dr. Herold Harms, on Ultram 07/14/2007    Past Medical History:  Diagnosis Date   Anemia    B12 deficient   B12 deficiency    Breast cancer ([place])    left breast.   Chronic SI joint pain    Bilateral, Dr. Estanislado Pandy   Fibromyalgia    Dr. Estanislado Pandy   GERD (gastroesophageal reflux disease)    Greater trochanteric bursitis of both hips    Dr. Estanislado Pandy   Hyperlipidemia    IBS (irritable bowel syndrome)    LV dysfunction    iatrogenic from chemotherapy ; on Carvedilol    Migraines    Osteoporosis    Dr Lomax---> transferring to a new gyn    Family History  Problem Relation Age of Onset   Diabetes Mother    Breast cancer Mother    Depression Mother        anxiety   Heart disease Mother        in her 25s   Colon cancer Sister    Healthy Daughter    Stroke Neg Hx    Past Surgical History:  Procedure Laterality Date   ABDOMINAL HYSTERECTOMY     BSO for  Endometriosis   CARPAL TUNNEL RELEASE     left   CERVICAL DISC SURGERY     COLONOSCOPY W/ POLYPECTOMY  1999   Dr Earlean Shawl   ESOPHAGEAL DILATION  2005   Dr Earlean Shawl   MASTECTOMY  2009   bilateral, Dr Boyce Medici     Social History   Social History Narrative   Lives w/ husband   Immunization History   Administered Date(s) Administered   Influenza Whole 07/14/2007, 05/31/2012   Influenza, High Dose Seasonal PF 07/18/2018   PFIZER(Purple Top)SARS-COV-2 Vaccination 10/30/2019, 11/26/2019   Td 04/01/1998   Tdap 10/29/2011     Objective: Vital Signs: BP 116/71 (BP Location: Right Arm, Patient Position: Sitting, Cuff Size: Normal)   Pulse 76   Ht 5' (1.524 m)   Wt 84 lb (38.1 kg)   BMI 16.41 kg/m    Physical Exam Vitals and nursing note reviewed.  Constitutional:      Appearance: She is well-developed.  HENT:     Head: Normocephalic and atraumatic.  Eyes:     Conjunctiva/sclera: Conjunctivae normal.  Pulmonary:     Effort: Pulmonary effort is normal.  Abdominal:     Palpations: Abdomen is soft.  Musculoskeletal:     Cervical back: Normal range of motion.  Skin:    General: Skin is warm and dry.     Capillary Refill: Capillary refill takes less than 2 seconds.  Neurological:  Mental Status: She is alert and oriented to person, place, and time.  Psychiatric:        Behavior: Behavior normal.     Musculoskeletal Exam: Generalized hyperalgesia and positive tender points on exam. C-spine limited ROM with lateral rotation.  Trapezius muscle tension and tenderness bilaterally. Tenderness over both SI joints.  Shoulder joints, elbow joints, wrist joints, MCPs, PIPs, and DIPs good ROM with no synovitis.  PIP and DIP thickening consistent with osteoarthritis of both hands.  Complete fist formation bilaterally.  Hip joints, knee joints, and ankle joints good ROM with no discomfort.  No warmth or effusion of knee joints.  No tenderness or swelling of ankle joints.   CDAI Exam: CDAI Score: -- Patient Global: --; Provider Global: -- Swollen: --; Tender: -- Joint Exam 03/10/2021   No joint exam has been documented for this visit   There is currently no information documented on the homunculus. Go to the Rheumatology activity and complete the homunculus joint  exam.  Investigation: No additional findings.  Imaging: DG Chest 2 View  Result Date: 03/07/2021 CLINICAL DATA:  COPD EXAM: CHEST - 2 VIEW COMPARISON:  05/18/2019, 03/22/2017 FINDINGS: The heart size and mediastinal contours are within normal limits. Hyperinflated lungs with advanced emphysematous changes bilaterally. Nodular opacity at the periphery of the left lung base is unchanged compared to 2018. No focal airspace consolidation, pleural effusion, or pneumothorax. Bilateral breast prostheses. Lower cervical ACDF. IMPRESSION: COPD.  No active cardiopulmonary disease. Electronically Signed   By: Davina Poke D.O.   On: 03/07/2021 09:13    Recent Labs: Lab Results  Component Value Date   WBC 3.9 06/28/2019   HGB 12.1 06/28/2019   PLT 168 06/28/2019   NA 141 06/28/2019   K 4.2 06/28/2019   CL 101 06/28/2019   CO2 27 06/28/2019   GLUCOSE 81 06/28/2019   BUN 15 06/28/2019   CREATININE 0.86 06/28/2019   BILITOT 0.3 06/28/2019   ALKPHOS 57 06/28/2019   AST 32 06/28/2019   ALT 16 06/28/2019   PROT 6.6 06/28/2019   ALBUMIN 4.4 06/28/2019   CALCIUM 9.7 06/28/2019   GFRAA 80 06/28/2019    Speciality Comments: Narcotic Agreement Broken- [place] found on last UDS.   Procedures:  Trigger Point Inj  Date/Time: 03/10/2021 11:09 AM Performed by: Ofilia Neas, PA-C Authorized by: Ofilia Neas, PA-C   Consent Given by:  Patient Site marked: the procedure site was marked   Timeout: prior to procedure the correct patient, procedure, and site was verified   Indications:  Pain Total # of Trigger Points:  2 Location: neck   Needle Size:  27 G Approach:  Dorsal Medications #1:  0.5 mL lidocaine 1 %; 10 mg triamcinolone acetonide 40 MG/ML Medications #2:  0.5 mL lidocaine 1 %; 10 mg triamcinolone acetonide 40 MG/ML Patient tolerance:  Patient tolerated the procedure well with no immediate complications Allergies: Amoxicillin-pot clavulanate, Doxycycline, and Venlafaxine    Assessment / Plan:     Visit Diagnoses: Fibromyalgia: She has generalized hyperalgesia and positive tender points on examination.  She has 1-2 flares per month.  She presents today with trapezius muscle tension and muscle tenderness bilaterally.  She requested trigger point injections which she tolerated.  Procedure note was completed above.  She will continue to take methocarbamol 500 mg twice daily as needed for muscle spasms.  We discussed the importance of regular exercise and good sleep hygiene.  She will follow-up in the office in 3 months.  Trapezius  muscle spasm: She has trapezius muscle tension and muscle tenderness bilaterally.  She has been experiencing muscle spasms intermittently.  She had trigger point injections on 10/18/2020 which provided significant relief and lasted longer than usual.  She requested repeat trigger point injections today.  She tolerated the procedure well.  Procedure note was completed above.  Aftercare was discussed.  She will continue to take methocarbamol 500 mg twice daily as needed for muscle spasms.  She does not need a refill at this time.  We discussed the importance of performing neck exercises on a daily basis.  DDD (degenerative disc disease), cervical: Chronic pain and stiffness.  She has limited range of motion with lateral rotation bilaterally.  She presents today with trapezius muscle tension and muscle tenderness.  Trigger point injections were performed today.  DDD (degenerative disc disease), lumbar: Chronic pain.  No symptoms of radiculopathy at this time.  Lateral epicondylitis of both elbows: She has intermittent discomfort due to lateral epicondylitis of both elbows.  Trochanteric bursitis of both hips: She has tenderness palpation over bilateral trochanteric bursa.  Discussed the importance of performing stretching exercises on a daily basis.  Primary osteoarthritis of both hands: She has PIP and DIP thickening consistent with osteoarthritis  of both hands.  No joint tenderness or synovitis was noted.  She was able to make a complete fist bilaterally.  Discussed the importance of joint protection and muscle strengthening.  Age-related osteoporosis without current pathological fracture: DEXA on 01/01/2020: The BMD measured at Femur Neck Right is 0.609 g/cm2 with a T-score of -3.1.  She continues to take a calcium and vitamin D supplement.  She is not taking any medications for osteoporosis management.  She has not had any recent falls or fractures. She will be due to update DEXA in May 2023.  History of vitamin D deficiency: She has been taking a calcium and vitamin D supplement on a daily basis.  Other insomnia: She has interrupted sleep at night due to nocturnal pain.  We discussed the importance of good sleep hygiene.  Other medical conditions are listed as follows:  History of IBS  History of scoliosis  History of gastroesophageal reflux (GERD)  History of migraine  History of COPD  History of depression  History of anxiety  History of hyperlipidemia  History of breast cancer  Orders: Orders Placed This Encounter  Procedures   Trigger Point Inj    No orders of the defined types were placed in this encounter.    Follow-Up Instructions: Return in about 3 months (around 06/10/2021) for Fibromyalgia, Osteoporosis.   Ofilia Neas, PA-C  Note - This record has been created using Dragon software.  Chart creation errors have been sought, but may not always  have been located. Such creation errors do not reflect on  the standard of medical care.

## 2021-03-18 ENCOUNTER — Telehealth: Payer: Self-pay | Admitting: Internal Medicine

## 2021-03-18 MED ORDER — TRELEGY ELLIPTA 100-62.5-25 MCG/INH IN AEPB: 1.0000 | INHALATION_SPRAY | Freq: Every day | RESPIRATORY_TRACT | 5 refills | Status: DC

## 2021-03-18 NOTE — Telephone Encounter (Signed)
Patient stated she was given Trelegy samples at her last OV with Dr. Annamaria Boots.  Patient stated Trelegy helps and she would like a Trelegy prescription sent to [place].  Message routed to Dr. Annamaria Boots to advise    Allergies  Allergen Reactions   Amoxicillin-Pot Clavulanate     diarrhea   Doxycycline     Nausea & vomiting   Venlafaxine     ? Reaction; ? Blurred vision   Current Outpatient Medications on File Prior to Visit  Medication Sig Dispense Refill   albuterol (PROAIR HFA) 108 (90 Base) MCG/ACT inhaler Inhale 1-2 puffs into the lungs every 6 (six) hours as needed for wheezing or shortness of breath. RF per PULM 16 g 5   Budeson-Glycopyrrol-Formoterol (BREZTRI AEROSPHERE) 160-9-4.8 MCG/ACT AERO Inhale 2 puffs into the lungs in the morning and at bedtime. 11.8 g 0   Calcium Carbonate-Vitamin D 600-400 MG-UNIT tablet Take 1 tablet by mouth 2 (two) times daily. 60 tablet 11   cetirizine (ZYRTEC) 10 MG tablet Take 10 mg by mouth daily.     citalopram (CELEXA) 40 MG tablet 1po qd Filled by Malachi Paradise-  Nurse Practitioner, [facility] 5 tablet 0   fluconazole (DIFLUCAN) 150 MG tablet Take 1 tablet (150 mg total) by mouth daily. 7 tablet 0   Fluticasone-Umeclidin-Vilant (TRELEGY ELLIPTA) 100-62.5-25 MCG/INH AEPB Inhale 1 puff into the lungs daily. 1 each 0   hydrOXYzine (ATARAX/VISTARIL) 10 MG tablet Take 10 mg by mouth 2 (two) times daily.     methocarbamol (ROBAXIN) 750 MG tablet TAKE 1 TABLET BY MOUTH 2 TIMES DAILY AS NEEDED FOR MUSCLE SPASMS (NON FORMULARY) 60 tablet 2   Probiotic Product (PROBIOTIC PO) Take 1 tablet by mouth daily.     Spacer/Aero-Holding Chambers (AEROCHAMBER MV) inhaler Use as instructed 1 each 0   Vitamin D, Ergocalciferol, (DRISDOL) 1.25 MG (50000 UNIT) CAPS capsule TAKE 1 CAPSULE BY MOUTH EVERY 7 DAYS 12 capsule 1   No current facility-administered medications on file prior to visit.

## 2021-03-18 NOTE — Telephone Encounter (Signed)
Please order Trelegy 100, # 1, inhale 1 puff then rinse mouth, once daily, ref x 12  Please remove Breztri from med list

## 2021-03-18 NOTE — Telephone Encounter (Signed)
Called and spoke with Patient. Patient aware Trelegy prescription sent to requested [facility],with refills for year.  Nothing further at this time.

## 2021-03-20 ENCOUNTER — Telehealth: Payer: Self-pay | Admitting: Internal Medicine

## 2021-03-20 MED ORDER — INCRUSE ELLIPTA 62.5 MCG/INH IN AEPB: 1.0000 | INHALATION_SPRAY | Freq: Every day | RESPIRATORY_TRACT | 11 refills | Status: DC

## 2021-03-20 NOTE — Telephone Encounter (Signed)
Spoke with the pt and notified of response per DrYoung. Both rxs sent.

## 2021-03-20 NOTE — Telephone Encounter (Signed)
Ok, thank you. She wanted some diflucan, you you okay with this? Looks like last time she got 150 mg for 7 days

## 2021-03-20 NOTE — Telephone Encounter (Signed)
Instead of Stiolto, please Rx Incruse # 1, inhale 1 puff, once daily, ref x 12

## 2021-03-20 NOTE — Telephone Encounter (Signed)
Ok to refill that Diflucan  thanks

## 2021-03-20 NOTE — Telephone Encounter (Signed)
I spoke with [name] and notified of response per Dr Annamaria Boots. She is okay with trying Stiolto, but when I went to send orx the following warning appeared:    Please advise if still okay to send or what to do. She is also asking if we can prescribe diflucan for her thrush. Thanks

## 2021-03-20 NOTE — Telephone Encounter (Signed)
She seems very sensitive to getting thrush. Please remove Breztri/ Trelegy from her med list and send        Stiolto # 1, inhale 2 puffs, once daily. This has no steroid and shouldn't cause thrush. Ref x 12

## 2021-03-20 NOTE — Telephone Encounter (Signed)
Tried calling the pt and did not get an answer- LMTCB  I already removed Trelegy from the list

## 2021-03-20 NOTE — Telephone Encounter (Signed)
Primary Pulmonologist: Young Last office visit and with whom: Young  What do we see them for (pulmonary problems): COPD mixed Last OV assessment/plan:    Patient Instructions by Deneise Lever, MD at 03/06/2021 2:00 PM  Author: Deneise Lever, MD Author Type: Physician Filed: 03/06/2021  2:21 PM  Note Status: Signed Cosign: Cosign Not Required Encounter Date: 03/06/2021  Editor: Deneise Lever, MD (Physician)               Order- CXR  dx COPD mixed type   Order- sample x 2 Trelegy 100 inhaler     Inhale 1 puff, then rinse well, once daily. Try using this just before a meal, either breakfast or supper. See if that helps reduce the risk of thrush in your mouth.   If the Trelegy works well, please call for prescription.        Orthostatic Vitals Recorded in This Encounter   03/06/2021  1410     BP Location: Left Arm  Cuff Size: Normal   Instructions    Return in about 6 months (around 09/06/2021).  Order- CXR  dx COPD mixed type   Order- sample x 2 Trelegy 100 inhaler     Inhale 1 puff, then rinse well, once daily. Try using this just before a meal, either breakfast or supper. See if that helps reduce the risk of thrush in your mouth.   If the Trelegy works well, please call for prescription.       Reason for call: Patient states she has thrush, symptoms began this morning (white patches in both of her cheeks and has a sore place on her tongue.  She says she brushes her teeth 7 times after she uses the Trelegy inhaler and has still gotten thrush.  She likes this inhaler because it is only once a day, but now has thrush.  She would like to change the inhaler. Dr. Annamaria Boots, please advise.  Thank you.  (examples of things to ask: : When did symptoms start? Fever? Cough? Productive? Color to sputum? More sputum than usual? Wheezing? Have you needed increased oxygen? Are you taking your respiratory medications? What over the counter measures have you tried?)  Allergies  Allergen  Reactions   Amoxicillin-Pot Clavulanate     diarrhea   Doxycycline     Nausea & vomiting   Venlafaxine     ? Reaction; ? Blurred vision    Immunization History  Administered Date(s) Administered   Influenza Whole 07/14/2007, 05/31/2012   Influenza, High Dose Seasonal PF 07/18/2018   PFIZER(Purple Top)SARS-COV-2 Vaccination 10/30/2019, 11/26/2019   Td 04/01/1998   Tdap 10/29/2011

## 2021-03-21 ENCOUNTER — Telehealth: Payer: Self-pay | Admitting: Internal Medicine

## 2021-03-21 ENCOUNTER — Other Ambulatory Visit: Payer: Self-pay | Admitting: Internal Medicine

## 2021-03-21 MED ORDER — FLUCONAZOLE 150 MG PO TABS: 150.0000 mg | ORAL_TABLET | Freq: Every day | ORAL | 0 refills | Status: DC

## 2021-03-21 NOTE — Telephone Encounter (Signed)
Called and spoke with patient, she states that [facility] but [facility].  Verified that Dr. Annamaria Boots approved of refilling the Diflucan and refill sent.  Patient aware and verbalized understanding.  Nothing further needed.

## 2021-04-11 ENCOUNTER — Other Ambulatory Visit: Payer: Self-pay | Admitting: Physician Assistant

## 2021-04-11 NOTE — Telephone Encounter (Signed)
Next Visit: 06/09/2021  Last Visit: 03/10/2021  Last Fill: 10/24/2020  Dx: Fibromyalgia  Current Dose per office note on 03/10/2021: methocarbamol 500 mg twice daily as needed for muscle spasms  Okay to refill Methocarbamol and at which dose?  Patient has been receiving Methocarbamol 750 mg twice daily since 2018.

## 2021-05-26 NOTE — Progress Notes (Deleted)
Office Visit Note  Patient: [name]             Date of Birth: Apr 09, 1951           MRN: [id number]             PCP: Lorrene Reid, PA-C Referring: Lorrene Reid, PA-C Visit Date: 06/09/2021 Occupation: @GUAROCC @  Subjective:  No chief complaint on file.   History of Present Illness: [name] is a 70 y.o. female ***   Activities of Daily Living:  Patient reports morning stiffness for *** {minute/hour:19697}.   Patient {ACTIONS;DENIES/REPORTS:21021675::"Denies"} nocturnal pain.  Difficulty dressing/grooming: {ACTIONS;DENIES/REPORTS:21021675::"Denies"} Difficulty climbing stairs: {ACTIONS;DENIES/REPORTS:21021675::"Denies"} Difficulty getting out of chair: {ACTIONS;DENIES/REPORTS:21021675::"Denies"} Difficulty using hands for taps, buttons, cutlery, and/or writing: {ACTIONS;DENIES/REPORTS:21021675::"Denies"}  No Rheumatology ROS completed.   PMFS History:  Patient Active Problem List   Diagnosis Date Noted  . Nasal congestion 09/06/2020  . History of thrush 09/22/2019  . Estrogen deficiency 06/28/2019  . Post-menopausal 06/28/2019  . Mood disorder ([place]) 03/17/2018  . Osteopenia after menopause 11/06/2017  . Medial epicondylitis of right elbow 10/14/2017  . MDD (major depressive disorder), severe ([place]) 09/22/2017  . MDD (major depressive disorder), recurrent episode, moderate ([place])   . Prolonged Q-T interval on ECG 09/20/2017  . Overdose 09/20/2017  . Acute cystitis with hematuria   . Suicide attempt ([place])   . Family history of diabetes mellitus- mom in 50's 08-08-17    Class: Chronic  . Family history of colon cancer- sister died 16yo 08-Aug-2017  . Adjustment disorder with mixed anxiety < depressed mood 08/08/2017  . B12 deficiency August 08, 2017  . Fibromyalgia syndrome 11/23/2016  . Other insomnia 11/23/2016  . Trapezius muscle spasm 11/23/2016  . DDD (degenerative disc disease), cervical 11/23/2016  . DDD (degenerative disc disease), lumbar  11/23/2016  . Other idiopathic scoliosis, lumbar region 11/23/2016  . Malignant neoplasm of female breast ([place]) 11/23/2016  . History of gastroesophageal reflux (GERD) 11/23/2016  . PCP NOTES >>>>>>>>>>>>>>>>>>>> 11/21/2015  . Constipation 02/15/2015  . Annual physical exam 11/20/2013  . COPD mixed type ([place]) 11/20/2013  . Unspecified adverse effect of unspecified drug, medicinal and biological substance 12/10/2011  . Vitamin D deficiency 07/06/2011  . CARDIOMYOPATHY, PRIMARY, DILATED 01/01/2009  . BREAST CANCER, HX OF 11/02/2008  . h/o HYPERLIPIDEMIA 10/18/2007  . GERD 10/18/2007  . ANEMIA, B12 DEFICIENCY 10/12/2007  . Anxiety,depression, insomnia 10/12/2007  . IBS 10/12/2007  . Primary osteoarthritis of both hands 10/12/2007  . Osteoporosis 10/12/2007  . COLONIC POLYPS, HX OF 10/12/2007  . MIGRAINES, HX OF 10/12/2007  . Fibromyalgia--Dr. Herold Harms, on Ultram 07/14/2007    Past Medical History:  Diagnosis Date  . Anemia    B12 deficient  . B12 deficiency   . Breast cancer ([place])    left breast.  . Chronic SI joint pain    Bilateral, Dr. Estanislado Pandy  . Fibromyalgia    Dr. Estanislado Pandy  . GERD (gastroesophageal reflux disease)   . Greater trochanteric bursitis of both hips    Dr. Estanislado Pandy  . Hyperlipidemia   . IBS (irritable bowel syndrome)   . LV dysfunction    iatrogenic from chemotherapy ; on Carvedilol   . Migraines   . Osteoporosis    Dr Lomax---> transferring to a new gyn    Family History  Problem Relation Age of Onset  . Diabetes Mother   . Breast cancer Mother   . Depression Mother        anxiety  . Heart disease Mother  in her 33s  . Colon cancer Sister   . Healthy Daughter   . Stroke Neg Hx    Past Surgical History:  Procedure Laterality Date  . ABDOMINAL HYSTERECTOMY     BSO for  Endometriosis  . CARPAL TUNNEL RELEASE     left  . CERVICAL DISC SURGERY    . COLONOSCOPY W/ POLYPECTOMY  1999   Dr Earlean Shawl  . ESOPHAGEAL DILATION  2005   Dr  Earlean Shawl  . MASTECTOMY  2009   bilateral, Dr Margot Chimes  . SEPTOPLASTY     Social History   Social History Narrative   Lives w/ husband   Immunization History  Administered Date(s) Administered  . Influenza Whole 07/14/2007, 05/31/2012  . Influenza, High Dose Seasonal PF 07/18/2018  . PFIZER(Purple Top)SARS-COV-2 Vaccination 10/30/2019, 11/26/2019  . Td 04/01/1998  . Tdap 10/29/2011     Objective: Vital Signs: There were no vitals taken for this visit.   Physical Exam   Musculoskeletal Exam: ***  CDAI Exam: CDAI Score: -- Patient Global: --; Provider Global: -- Swollen: --; Tender: -- Joint Exam 06/09/2021   No joint exam has been documented for this visit   There is currently no information documented on the homunculus. Go to the Rheumatology activity and complete the homunculus joint exam.  Investigation: No additional findings.  Imaging: No results found.  Recent Labs: Lab Results  Component Value Date   WBC 3.9 06/28/2019   HGB 12.1 06/28/2019   PLT 168 06/28/2019   NA 141 06/28/2019   K 4.2 06/28/2019   CL 101 06/28/2019   CO2 27 06/28/2019   GLUCOSE 81 06/28/2019   BUN 15 06/28/2019   CREATININE 0.86 06/28/2019   BILITOT 0.3 06/28/2019   ALKPHOS 57 06/28/2019   AST 32 06/28/2019   ALT 16 06/28/2019   PROT 6.6 06/28/2019   ALBUMIN 4.4 06/28/2019   CALCIUM 9.7 06/28/2019   GFRAA 80 06/28/2019    Speciality Comments: Narcotic Agreement Broken- [place] found on last UDS.   Procedures:  No procedures performed Allergies: Amoxicillin-pot clavulanate, Doxycycline, and Venlafaxine   Assessment / Plan:     Visit Diagnoses: No diagnosis found.  Orders: No orders of the defined types were placed in this encounter.  No orders of the defined types were placed in this encounter.   Face-to-face time spent with patient was *** minutes. Greater than 50% of time was spent in counseling and coordination of care.  Follow-Up Instructions: No follow-ups  on file.   Earnestine Mealing, CMA  Note - This record has been created using Editor, commissioning.  Chart creation errors have been sought, but may not always  have been located. Such creation errors do not reflect on  the standard of medical care.

## 2021-06-09 ENCOUNTER — Ambulatory Visit: Payer: Medicare Other | Admitting: Physician Assistant

## 2021-06-09 DIAGNOSIS — Z853 Personal history of malignant neoplasm of breast: Secondary | ICD-10-CM

## 2021-06-09 DIAGNOSIS — Z8709 Personal history of other diseases of the respiratory system: Secondary | ICD-10-CM

## 2021-06-09 DIAGNOSIS — M7712 Lateral epicondylitis, left elbow: Secondary | ICD-10-CM

## 2021-06-09 DIAGNOSIS — M62838 Other muscle spasm: Secondary | ICD-10-CM

## 2021-06-09 DIAGNOSIS — M5136 Other intervertebral disc degeneration, lumbar region: Secondary | ICD-10-CM

## 2021-06-09 DIAGNOSIS — M797 Fibromyalgia: Secondary | ICD-10-CM

## 2021-06-09 DIAGNOSIS — M5030 Other cervical disc degeneration, unspecified cervical region: Secondary | ICD-10-CM

## 2021-06-09 DIAGNOSIS — M19041 Primary osteoarthritis, right hand: Secondary | ICD-10-CM

## 2021-06-09 DIAGNOSIS — Z8639 Personal history of other endocrine, nutritional and metabolic disease: Secondary | ICD-10-CM

## 2021-06-09 DIAGNOSIS — M7062 Trochanteric bursitis, left hip: Secondary | ICD-10-CM

## 2021-06-09 DIAGNOSIS — Z8659 Personal history of other mental and behavioral disorders: Secondary | ICD-10-CM

## 2021-06-09 DIAGNOSIS — Z8719 Personal history of other diseases of the digestive system: Secondary | ICD-10-CM

## 2021-06-09 DIAGNOSIS — Z8739 Personal history of other diseases of the musculoskeletal system and connective tissue: Secondary | ICD-10-CM

## 2021-06-09 DIAGNOSIS — Z8669 Personal history of other diseases of the nervous system and sense organs: Secondary | ICD-10-CM

## 2021-06-09 DIAGNOSIS — M810 Age-related osteoporosis without current pathological fracture: Secondary | ICD-10-CM

## 2021-06-09 DIAGNOSIS — G4709 Other insomnia: Secondary | ICD-10-CM

## 2021-06-18 NOTE — Progress Notes (Signed)
Office Visit Note  Patient: [name]             Date of Birth: June 30, 1951           MRN: [id number]             PCP: Rachael Reid, PA-C Referring: Rachael Reid, PA-C Visit Date: 06/23/2021 Occupation: @GUAROCC @  Subjective:  Trapezius muscle spasms   History of Present Illness: [name] is a 70 y.o. female with history of fibromyalgia, osteoarthritis, and osteoporosis.  She continues to experience intermittent myalgias and muscle tenderness due to fibromyalgia.  She has trapezius muscle tension and tenderness bilaterally.  She has been experiencing muscle spasms intermittently.  She takes methocarbamol 750 mg 1 tablet twice daily as needed for muscle spasms and Tylenol as needed for pain relief.  She requested trigger point injections today.  She continues to have discomfort in both hips due to trochanteric bursitis bilaterally.  She states about 6 weeks ago she fell while getting out of the bathtub and landed on her left knee.  She states that she noticed some bruising on her left knee and left shin after the fall.  She states that her discomfort has gradually been improving.  She denies any other recent falls or fractures.  She continues to take a calcium and vitamin D supplement on a daily basis.   Activities of Daily Living:  Patient reports morning stiffness for 45-60 minutes.   Patient Reports nocturnal pain.  Difficulty dressing/grooming: Denies Difficulty climbing stairs: Denies Difficulty getting out of chair: Denies Difficulty using hands for taps, buttons, cutlery, and/or writing: Reports  Review of Systems  Constitutional:  Positive for fatigue.  HENT:  Negative for mouth sores, mouth dryness and nose dryness.   Eyes:  Negative for pain, itching and dryness.  Respiratory:  Positive for shortness of breath. Negative for difficulty breathing.   Cardiovascular:  Negative for chest pain and palpitations.  Gastrointestinal:  Positive for constipation  and diarrhea. Negative for blood in stool.  Endocrine: Negative for increased urination.  Genitourinary:  Negative for difficulty urinating.  Musculoskeletal:  Positive for joint pain, joint pain, myalgias, morning stiffness, muscle tenderness and myalgias. Negative for joint swelling.  Skin:  Negative for color change, rash and redness.  Allergic/Immunologic: Negative for susceptible to infections.  Neurological:  Positive for dizziness and numbness. Negative for headaches and memory loss.  Hematological:  Positive for bruising/bleeding tendency.  Psychiatric/Behavioral:  Negative for confusion.    PMFS History:  Patient Active Problem List   Diagnosis Date Noted   Nasal congestion 09/06/2020   History of thrush 09/22/2019   Estrogen deficiency 06/28/2019   Post-menopausal 06/28/2019   Mood disorder ([place]) 03/17/2018   Osteopenia after menopause 11/06/2017   Medial epicondylitis of right elbow 10/14/2017   MDD (major depressive disorder), severe ([place]) 09/22/2017   MDD (major depressive disorder), recurrent episode, moderate ([facility])    Prolonged Q-T interval on ECG 09/20/2017   Overdose 09/20/2017   Acute cystitis with hematuria    Suicide attempt [facility])    Family history of diabetes mellitus- mom in 50's 24-Aug-2017    Class: Chronic   Family history of colon cancer- sister died 45yo 2017/08/24   Adjustment disorder with mixed anxiety < depressed mood Aug 24, 2017   B12 deficiency 08/24/2017   Fibromyalgia syndrome 11/23/2016   Other insomnia 11/23/2016   Trapezius muscle spasm 11/23/2016   DDD (degenerative disc disease), cervical 11/23/2016   DDD (degenerative disc disease), lumbar 11/23/2016  Other idiopathic scoliosis, lumbar region 11/23/2016   Malignant neoplasm of female breast ([place]) 11/23/2016   History of gastroesophageal reflux (GERD) 11/23/2016   PCP NOTES >>>>>>>>>>>>>>>>>>>> 11/21/2015   Constipation 02/15/2015   Annual physical exam 11/20/2013   COPD mixed type  ([place]) 11/20/2013   Unspecified adverse effect of unspecified drug, medicinal and biological substance 12/10/2011   Vitamin D deficiency 07/06/2011   CARDIOMYOPATHY, PRIMARY, DILATED 01/01/2009   BREAST CANCER, HX OF 11/02/2008   h/o HYPERLIPIDEMIA 10/18/2007   GERD 10/18/2007   ANEMIA, B12 DEFICIENCY 10/12/2007   Anxiety,depression, insomnia 10/12/2007   IBS 10/12/2007   Primary osteoarthritis of both hands 10/12/2007   Osteoporosis 10/12/2007   COLONIC POLYPS, HX OF 10/12/2007   MIGRAINES, HX OF 10/12/2007   Fibromyalgia--Dr. Herold Harms, on Ultram 07/14/2007    Past Medical History:  Diagnosis Date   Anemia    B12 deficient   B12 deficiency    Breast cancer ([place])    left breast.   Chronic SI joint pain    Bilateral, Dr. Estanislado Pandy   Fibromyalgia    Dr. Estanislado Pandy   GERD (gastroesophageal reflux disease)    Greater trochanteric bursitis of both hips    Dr. Estanislado Pandy   Hyperlipidemia    IBS (irritable bowel syndrome)    LV dysfunction    iatrogenic from chemotherapy ; on Carvedilol    Migraines    Osteoporosis    Dr Lomax---> transferring to a new gyn    Family History  Problem Relation Age of Onset   Diabetes Mother    Breast cancer Mother    Depression Mother        anxiety   Heart disease Mother        in her 42s   Colon cancer Sister    Healthy Daughter    Stroke Neg Hx    Past Surgical History:  Procedure Laterality Date   ABDOMINAL HYSTERECTOMY     BSO for  Endometriosis   CARPAL TUNNEL RELEASE     left   CERVICAL DISC SURGERY     COLONOSCOPY W/ POLYPECTOMY  1999   Dr Earlean Shawl   ESOPHAGEAL DILATION  2005   Dr Earlean Shawl   MASTECTOMY  2009   bilateral, Dr Boyce Medici     Social History   Social History Narrative   Lives w/ husband   Immunization History  Administered Date(s) Administered   Influenza Whole 07/14/2007, 05/31/2012   Influenza, High Dose Seasonal PF 07/18/2018   PFIZER(Purple Top)SARS-COV-2 Vaccination 10/30/2019, 11/26/2019    Td 04/01/1998   Tdap 10/29/2011     Objective: Vital Signs: BP 94/61 (BP Location: Left Arm, Patient Position: Sitting, Cuff Size: Normal)   Pulse 85   Ht 5' (1.524 m)   Wt 83 lb 6.4 oz (37.8 kg)   BMI 16.29 kg/m    Physical Exam Vitals and nursing note reviewed.  Constitutional:      Appearance: She is well-developed.  HENT:     Head: Normocephalic and atraumatic.  Eyes:     Conjunctiva/sclera: Conjunctivae normal.  Pulmonary:     Effort: Pulmonary effort is normal.  Abdominal:     Palpations: Abdomen is soft.  Musculoskeletal:     Cervical back: Normal range of motion.  Skin:    General: Skin is warm and dry.     Capillary Refill: Capillary refill takes less than 2 seconds.  Neurological:     Mental Status: She is alert and oriented to person, place, and time.  Psychiatric:        Behavior: Behavior normal.     Musculoskeletal Exam: Generalized hyperalgesia and positive tender points.  Has limited range of motion with lateral rotation and flexion.  Trapezius muscle tension and tenderness bilaterally.  Shoulder joints, elbow joints, wrist joints, MCPs, PIPs, DIPs have good range of motion with no synovitis.  PIP and DIP prominence consistent with osteoarthritis of both hands.  Complete fist formation bilaterally.  Left hip has limited range of motion with some discomfort.  Tenderness over bilateral trochanteric bursa.  Knee joints have good range of motion with some discomfort in the left knee.  No warmth or effusion of the knee joints noted.  Ankle joints have good range of motion with no tenderness or joint swelling.  CDAI Exam: CDAI Score: -- Patient Global: --; Provider Global: -- Swollen: --; Tender: -- Joint Exam 06/23/2021   No joint exam has been documented for this visit   There is currently no information documented on the homunculus. Go to the Rheumatology activity and complete the homunculus joint exam.  Investigation: No additional  findings.  Imaging: No results found.  Recent Labs: Lab Results  Component Value Date   WBC 3.9 06/28/2019   HGB 12.1 06/28/2019   PLT 168 06/28/2019   NA 141 06/28/2019   K 4.2 06/28/2019   CL 101 06/28/2019   CO2 27 06/28/2019   GLUCOSE 81 06/28/2019   BUN 15 06/28/2019   CREATININE 0.86 06/28/2019   BILITOT 0.3 06/28/2019   ALKPHOS 57 06/28/2019   AST 32 06/28/2019   ALT 16 06/28/2019   PROT 6.6 06/28/2019   ALBUMIN 4.4 06/28/2019   CALCIUM 9.7 06/28/2019   GFRAA 80 06/28/2019    Speciality Comments: Narcotic Agreement Broken- [place] found on last UDS.   Procedures:  Trigger Point Inj  Date/Time: 06/23/2021 11:16 AM Performed by: Ofilia Neas, PA-C Authorized by: Ofilia Neas, PA-C   Consent Given by:  Patient Site marked: the procedure site was marked   Timeout: prior to procedure the correct patient, procedure, and site was verified   Indications:  Pain Total # of Trigger Points:  2 Location: neck   Needle Size:  27 G Approach:  Dorsal Medications #1:  0.5 mL lidocaine 1 %; 10 mg triamcinolone acetonide 40 MG/ML Medications #2:  0.5 mL lidocaine 1 %; 10 mg triamcinolone acetonide 40 MG/ML Patient tolerance:  Patient tolerated the procedure well with no immediate complications Allergies: Amoxicillin-pot clavulanate, Doxycycline, and Venlafaxine   Assessment / Plan:     Visit Diagnoses: Fibromyalgia: She has generalized hyperalgesia and positive tender points on examination.  She presents today with trapezius muscle tension and tenderness bilaterally.  She requested trigger point injections and tolerated the procedure well.  She takes methocarbamol 750 mg twice daily as needed for muscle spasms.  She does not need a refill at this time.  We discussed the importance of regular exercise and good sleep hygiene.  She will follow up in the office in 3 months.   Trapezius muscle spasm: She presents today with trapezius muscle tension and tenderness  bilaterally.  She has been experiencing muscle spasms intermittently.  She takes methocarbamol 750 mg twice daily as needed for muscle spasms.  She does not need a refill at this time.  She requested trigger point injections today.  She tolerated the procedure well.  Aftercare was discussed.  Procedure note completed above.   DDD (degenerative disc disease), cervical: She has limited ROM with lateral  rotation and flexion of the C-spine.  No symptoms of radiculopathy at this time.  Trapezius muscle tension and tenderness bilaterally.  She experiences muscle spasms intermittently take.  She takes methocarbamol 750 mg 1 tablet twice daily as needed for muscle spasms.  She requested trigger point injections today.  DDD (degenerative disc disease), lumbar: Midline spinal tenderness in the lumbar region noted.  No symptoms of radiculopathy at this time. Discussed the importance of core strengthening and performing back exercises.  Primary osteoarthritis of both hands: She has PIP and DIP thickening consistent with osteoarthritis of both hands.  CMC joint prominence noted bilaterally.  Complete fist formation bilaterally.  No tenderness or synovitis was noted.  Discussed the importance of joint protection and muscle strengthening.  She  Lateral epicondylitis of both elbows: She has some tenderness over the lateral epicondyle both elbow joints.  Both elbows have good range of motion with no inflammation.  Trochanteric bursitis of both hips: She has some limited range of motion in the left hip but no groin pain currently.  Tenderness over bilateral trochanteric bursa noted.  Discussed the importance of performing stretching exercises on a daily basis.  Chronic pain of left knee: Patient reports that about 6 weeks ago she fell while getting out of the bathtub and landed on her left knee.  She noticed some bruising on the anterior surface of her left knee and shin which has since resolved.  She was able to bear  full weight on her left lower extremity after the fall.  Her discomfort is gradually been improving.  She has good range of motion of the left knee joint on examination today with no warmth or effusion.  She declined updated x-rays at this time.  She was advised to notify us if her discomfort persists or worsens. We discussed that she can try icing, elevating, and using compression as needed if her symptoms persist or worsen.  Age-related osteoporosis without current pathological fracture - DEXA on 01/01/2020: The BMD measured at Femur Neck Right is 0.609 g/cm2 with a T-score of -3.1.  She continues to take a calcium and vitamin D supplement.  Patient has declined treatment for osteoporosis. Discussed the importance of fall prevention.   History of vitamin D deficiency: She is taking a vitamin D supplement on a daily basis.  Other insomnia  Other medical conditions are listed as follows:  History of IBS  History of scoliosis  History of gastroesophageal reflux (GERD)  History of migraine  History of COPD  History of depression  History of anxiety  History of hyperlipidemia  History of breast cancer  Orders: Orders Placed This Encounter  Procedures   Trigger Point Inj   No orders of the defined types were placed in this encounter.   Follow-Up Instructions: Return in about 3 months (around 09/23/2021) for Fibromyalgia, Osteoarthritis.   Ofilia Neas, PA-C  Note - This record has been created using Dragon software.  Chart creation errors have been sought, but may not always  have been located. Such creation errors do not reflect on  the standard of medical care.

## 2021-06-23 ENCOUNTER — Encounter: Payer: Self-pay | Admitting: Physician Assistant

## 2021-06-23 ENCOUNTER — Ambulatory Visit: Payer: Medicare Other | Admitting: Physician Assistant

## 2021-06-23 ENCOUNTER — Other Ambulatory Visit: Payer: Self-pay

## 2021-06-23 VITALS — BP 94/61 | HR 85 | Ht 60.0 in | Wt 83.4 lb

## 2021-06-23 DIAGNOSIS — M5136 Other intervertebral disc degeneration, lumbar region: Secondary | ICD-10-CM | POA: Diagnosis not present

## 2021-06-23 DIAGNOSIS — Z8739 Personal history of other diseases of the musculoskeletal system and connective tissue: Secondary | ICD-10-CM

## 2021-06-23 DIAGNOSIS — M51369 Other intervertebral disc degeneration, lumbar region without mention of lumbar back pain or lower extremity pain: Secondary | ICD-10-CM

## 2021-06-23 DIAGNOSIS — M5030 Other cervical disc degeneration, unspecified cervical region: Secondary | ICD-10-CM | POA: Diagnosis not present

## 2021-06-23 DIAGNOSIS — Z8639 Personal history of other endocrine, nutritional and metabolic disease: Secondary | ICD-10-CM

## 2021-06-23 DIAGNOSIS — Z8719 Personal history of other diseases of the digestive system: Secondary | ICD-10-CM | POA: Diagnosis not present

## 2021-06-23 DIAGNOSIS — M19041 Primary osteoarthritis, right hand: Secondary | ICD-10-CM | POA: Diagnosis not present

## 2021-06-23 DIAGNOSIS — M62838 Other muscle spasm: Secondary | ICD-10-CM | POA: Diagnosis not present

## 2021-06-23 DIAGNOSIS — G4709 Other insomnia: Secondary | ICD-10-CM | POA: Diagnosis not present

## 2021-06-23 DIAGNOSIS — M7061 Trochanteric bursitis, right hip: Secondary | ICD-10-CM | POA: Diagnosis not present

## 2021-06-23 DIAGNOSIS — M7062 Trochanteric bursitis, left hip: Secondary | ICD-10-CM

## 2021-06-23 DIAGNOSIS — M810 Age-related osteoporosis without current pathological fracture: Secondary | ICD-10-CM

## 2021-06-23 DIAGNOSIS — Z8669 Personal history of other diseases of the nervous system and sense organs: Secondary | ICD-10-CM

## 2021-06-23 DIAGNOSIS — M25562 Pain in left knee: Secondary | ICD-10-CM

## 2021-06-23 DIAGNOSIS — M797 Fibromyalgia: Secondary | ICD-10-CM | POA: Diagnosis not present

## 2021-06-23 DIAGNOSIS — M7711 Lateral epicondylitis, right elbow: Secondary | ICD-10-CM | POA: Diagnosis not present

## 2021-06-23 DIAGNOSIS — Z8709 Personal history of other diseases of the respiratory system: Secondary | ICD-10-CM

## 2021-06-23 DIAGNOSIS — Z8659 Personal history of other mental and behavioral disorders: Secondary | ICD-10-CM

## 2021-06-23 DIAGNOSIS — M7712 Lateral epicondylitis, left elbow: Secondary | ICD-10-CM

## 2021-06-23 DIAGNOSIS — G8929 Other chronic pain: Secondary | ICD-10-CM

## 2021-06-23 DIAGNOSIS — M19042 Primary osteoarthritis, left hand: Secondary | ICD-10-CM

## 2021-06-23 DIAGNOSIS — Z853 Personal history of malignant neoplasm of breast: Secondary | ICD-10-CM

## 2021-06-23 MED ORDER — LIDOCAINE HCL 1 % IJ SOLN
0.5000 mL | INTRAMUSCULAR | Status: AC | PRN
  Administered 2021-06-23: .5 mL

## 2021-06-23 MED ORDER — TRIAMCINOLONE ACETONIDE 40 MG/ML IJ SUSP
10.0000 mg | INTRAMUSCULAR | Status: AC | PRN
  Administered 2021-06-23: 10 mg via INTRAMUSCULAR

## 2021-07-14 ENCOUNTER — Encounter: Payer: Self-pay | Admitting: Internal Medicine

## 2021-07-14 NOTE — Assessment & Plan Note (Addendum)
Nonspecific exacerbation- subacute Plan- sample trial Trelegy for comparison with Breztri, CXR

## 2021-07-14 NOTE — Assessment & Plan Note (Signed)
Not in obvious R heart failure at this visit

## 2021-07-19 ENCOUNTER — Other Ambulatory Visit: Payer: Self-pay | Admitting: Rheumatology

## 2021-07-21 NOTE — Telephone Encounter (Signed)
Next Visit: 09/30/2021  Last Visit: 06/23/2021  Last Fill: 04/11/2021  Dx: Fibromyalgia  Current Dose per office note on 06/23/2021: methocarbamol 750 mg twice daily as needed for muscle spasms.   Okay to refill Methocarbamol?

## 2021-09-01 IMAGING — DX DG CHEST 2V
2 series · 2 of 2 positions shown · non-contrast
Comparison: March 22, 2017

CLINICAL DATA: Follow-up emphysema.

EXAM:
CHEST - 2 VIEW

[chest pa]
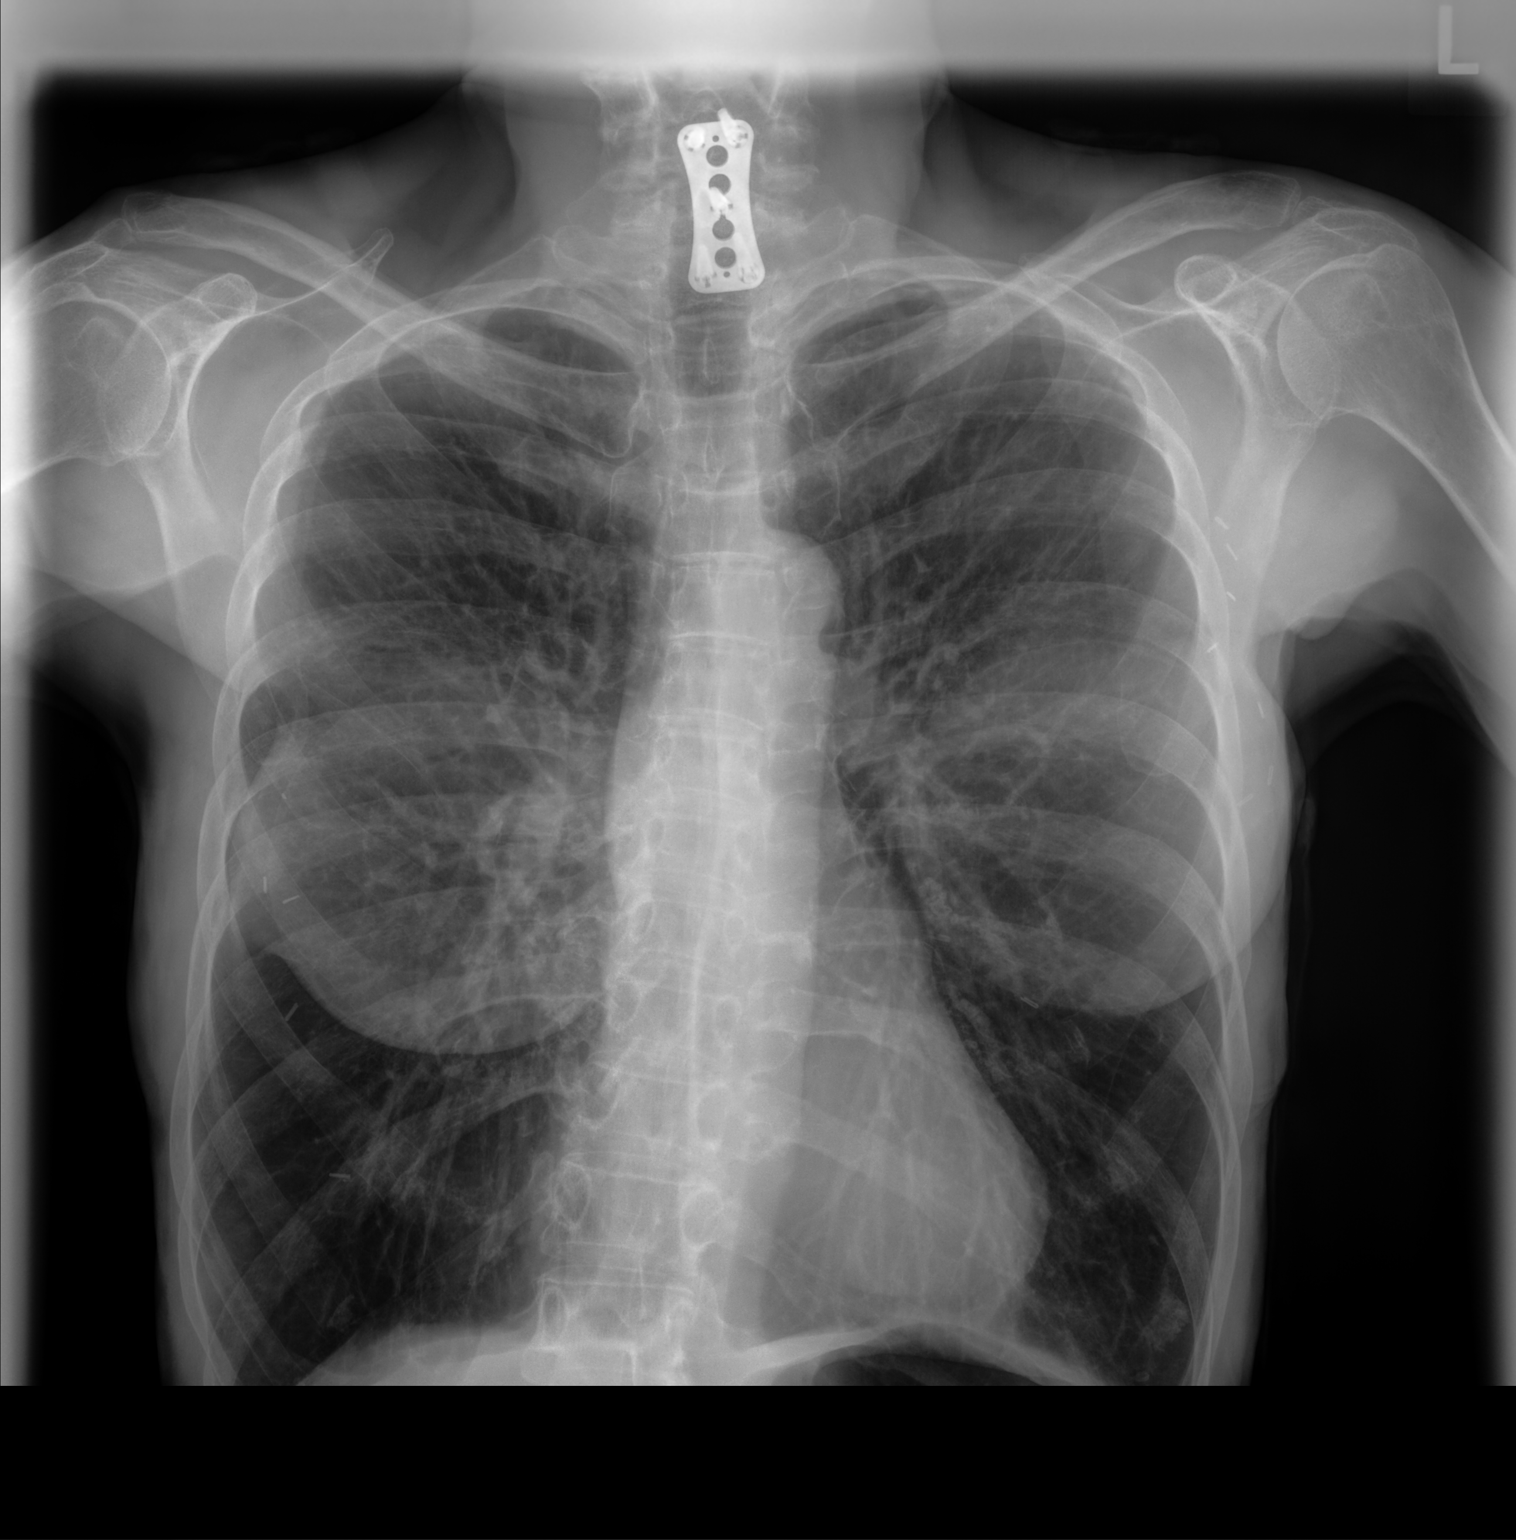

[chest lat]
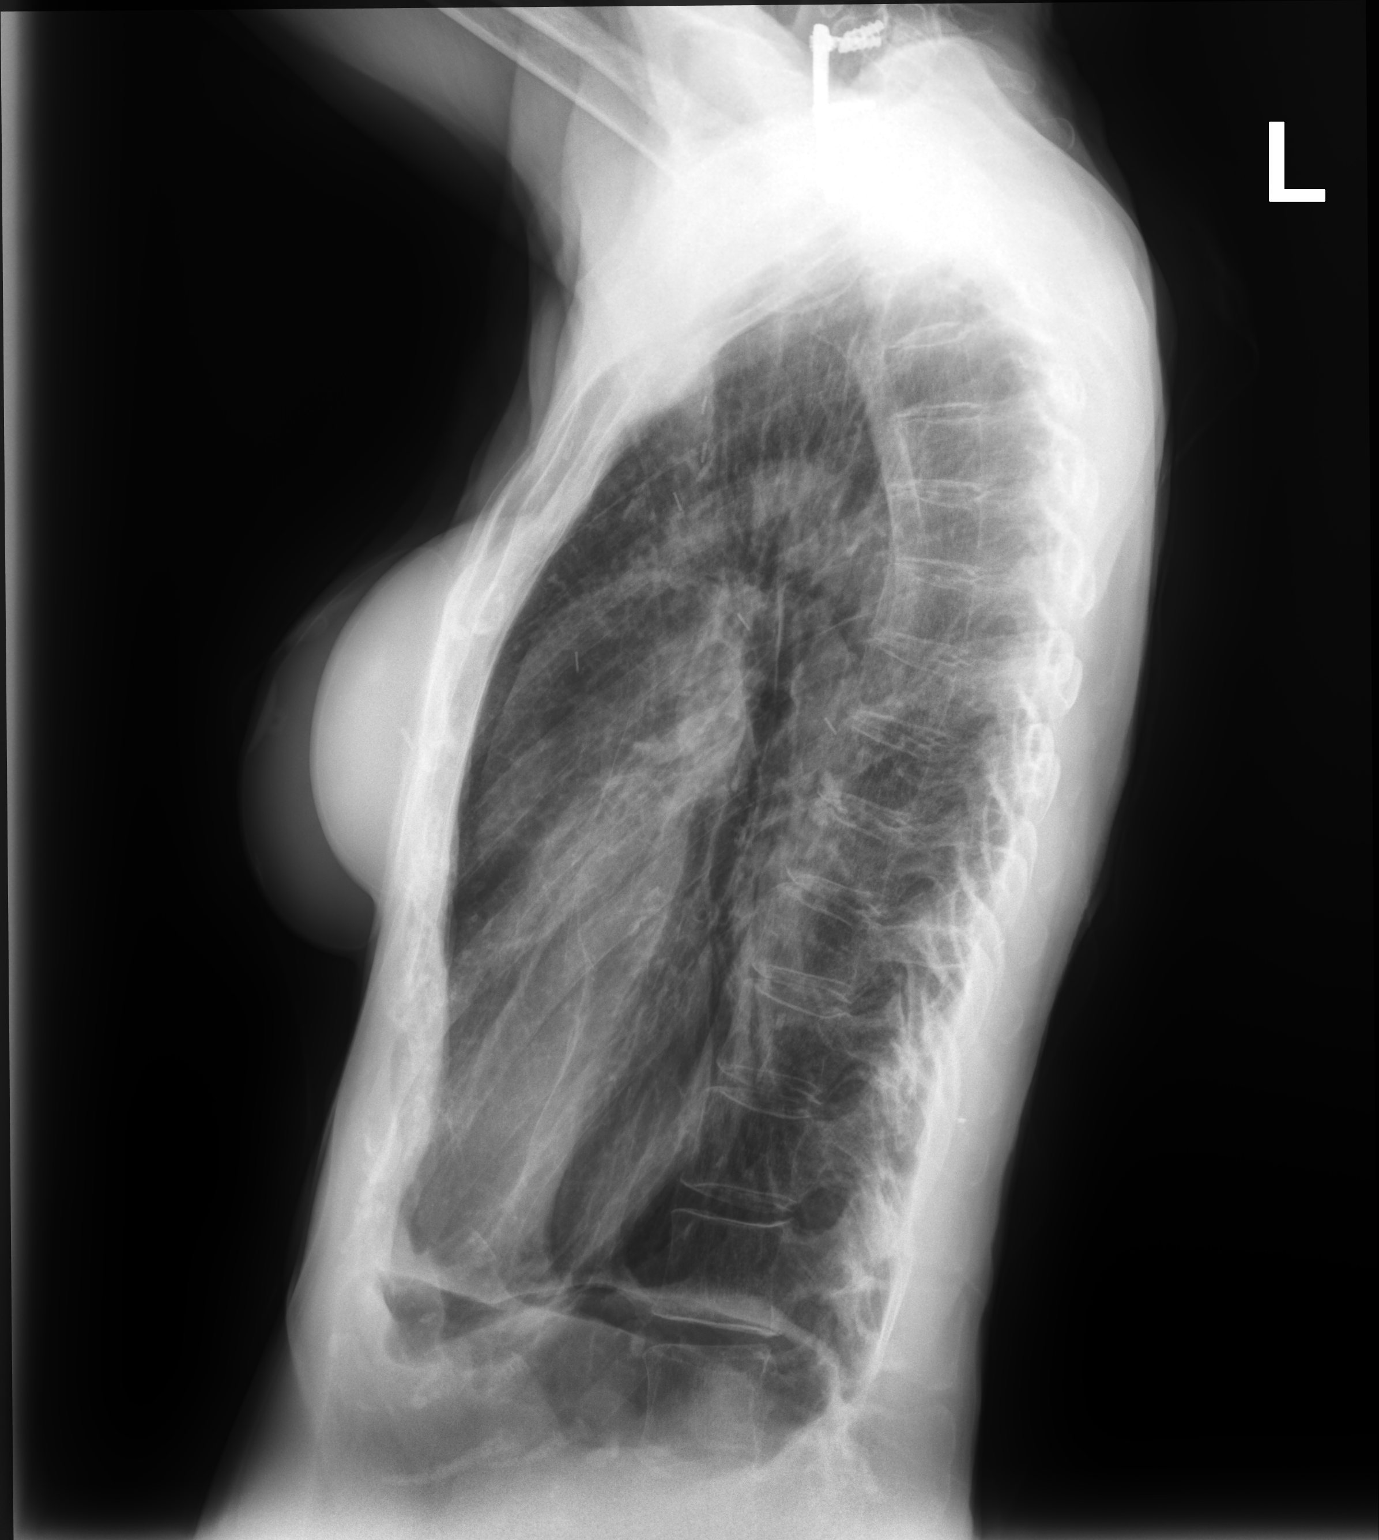

[2 of 2 positions shown; findings below may reference images not displayed]

FINDINGS: Cardiomediastinal silhouette is normal. Mediastinal contours appear
intact.

Marked hyperinflation. Severe upper lobe predominant emphysema.
Nodular opacity in the left lung base is stable from 8604.

Osseous structures are without acute abnormality. Postsurgical
changes of the chest wall. Breast implants.
IMPRESSION: Marked hyperinflation and severe upper lobe predominant emphysema.

Nodular opacity in the left lung base is stable from 8604, etiology
uncertain. The patient may benefit from screening low-dose chest CT.

## 2021-09-16 NOTE — Progress Notes (Signed)
Office Visit Note  Patient: [name]             Date of Birth: 08-05-1951           MRN: [id number]             PCP: Lorrene Reid, PA-C Referring: Lorrene Reid, PA-C Visit Date: 09/30/2021 Occupation: @GUAROCC @  Subjective:  Pain in both hips and neck  History of Present Illness: [name] is a 71 y.o. female with a history of fibromyalgia, osteoarthritis and degenerative disc disease.  She continues to have some generalized pain and discomfort from fibromyalgia.  She states she continues to have discomfort over bilateral trochanteric area over the several months.  The pain has been gradually getting worse and has difficulty sleeping on her side.  She also has discomfort in her elbows from lateral epicondylitis more so on the right side.  She has  trapezius spasm and pain in bilateral trapezius region..  She continues to have some discomfort in her hands, neck and her lower back.  She takes methocarbamol 750 mg p.o. daily for muscle spasms and discomfort.  She is not taking any treatment for osteoporosis.  She states she has tried bisphosphonates but had problems GI side effects and had to stop it.  Activities of Daily Living:  Patient reports morning stiffness for 45-60 minutes.   Patient Reports nocturnal pain.  Difficulty dressing/grooming: Denies Difficulty climbing stairs: Denies Difficulty getting out of chair: Denies Difficulty using hands for taps, buttons, cutlery, and/or writing: Reports  Review of Systems  Constitutional:  Negative for fatigue.  HENT:  Negative for mouth sores, mouth dryness and nose dryness.   Eyes:  Negative for pain, itching and dryness.  Respiratory:  Negative for shortness of breath and difficulty breathing.   Cardiovascular:  Negative for chest pain and palpitations.  Gastrointestinal:  Negative for blood in stool, constipation and diarrhea.  Endocrine: Negative for increased urination.  Genitourinary:  Negative for  difficulty urinating.  Musculoskeletal:  Positive for joint pain, joint pain, myalgias, morning stiffness, muscle tenderness and myalgias. Negative for joint swelling.  Skin:  Negative for color change, rash and redness.  Allergic/Immunologic: Negative for susceptible to infections.  Neurological:  Positive for dizziness and numbness. Negative for headaches, memory loss and weakness.  Hematological:  Positive for bruising/bleeding tendency.  Psychiatric/Behavioral:  Negative for confusion.    PMFS History:  Patient Active Problem List   Diagnosis Date Noted   Nasal congestion 09/06/2020   History of thrush 09/22/2019   Estrogen deficiency 06/28/2019   Post-menopausal 06/28/2019   Mood disorder ([place]) 03/17/2018   Osteopenia after menopause 11/06/2017   Medial epicondylitis of right elbow 10/14/2017   MDD (major depressive disorder), severe ([place]) 09/22/2017   MDD (major depressive disorder), recurrent episode, moderate ([facility])    Prolonged Q-T interval on ECG 09/20/2017   Overdose 09/20/2017   Acute cystitis with hematuria    Suicide attempt [facility] [place])    Family history of diabetes mellitus- mom in 50's 08-11-17    Class: Chronic   Family history of colon cancer- sister died 45yo 11-Aug-2017   Adjustment disorder with mixed anxiety < depressed mood 2017-08-11   B12 deficiency 08/11/2017   Fibromyalgia syndrome 11/23/2016   Other insomnia 11/23/2016   Trapezius muscle spasm 11/23/2016   DDD (degenerative disc disease), cervical 11/23/2016   DDD (degenerative disc disease), lumbar 11/23/2016   Other idiopathic scoliosis, lumbar region 11/23/2016   Malignant neoplasm of female breast ([place]) 11/23/2016  History of gastroesophageal reflux (GERD) 11/23/2016   PCP NOTES >>>>>>>>>>>>>>>>>>>> 11/21/2015   Constipation 02/15/2015   Annual physical exam 11/20/2013   COPD mixed type ([place]) 11/20/2013   Unspecified adverse effect of unspecified drug, medicinal and biological substance 12/10/2011    Vitamin D deficiency 07/06/2011   CARDIOMYOPATHY, PRIMARY, DILATED 01/01/2009   BREAST CANCER, HX OF 11/02/2008   h/o HYPERLIPIDEMIA 10/18/2007   GERD 10/18/2007   ANEMIA, B12 DEFICIENCY 10/12/2007   Anxiety,depression, insomnia 10/12/2007   IBS 10/12/2007   Primary osteoarthritis of both hands 10/12/2007   Osteoporosis 10/12/2007   COLONIC POLYPS, HX OF 10/12/2007   MIGRAINES, HX OF 10/12/2007   Fibromyalgia--Dr. Herold Harms, on Ultram 07/14/2007    Past Medical History:  Diagnosis Date   Anemia    B12 deficient   B12 deficiency    Breast cancer ([place])    left breast.   Chronic SI joint pain    Bilateral, Dr. Estanislado Pandy   Fibromyalgia    Dr. Estanislado Pandy   GERD (gastroesophageal reflux disease)    Greater trochanteric bursitis of both hips    Dr. Estanislado Pandy   Hyperlipidemia    IBS (irritable bowel syndrome)    LV dysfunction    iatrogenic from chemotherapy ; on Carvedilol    Migraines    Osteoporosis    Dr Lomax---> transferring to a new gyn    Family History  Problem Relation Age of Onset   Diabetes Mother    Breast cancer Mother    Depression Mother        anxiety   Heart disease Mother        in her 49s   Colon cancer Sister    Healthy Daughter    Stroke Neg Hx    Past Surgical History:  Procedure Laterality Date   ABDOMINAL HYSTERECTOMY     BSO for  Endometriosis   CARPAL TUNNEL RELEASE     left   CERVICAL DISC SURGERY     COLONOSCOPY W/ POLYPECTOMY  1999   Dr Earlean Shawl   ESOPHAGEAL DILATION  2005   Dr Earlean Shawl   MASTECTOMY  2009   bilateral, Dr Boyce Medici     Social History   Social History Narrative   Lives w/ husband   Immunization History  Administered Date(s) Administered   Influenza Whole 07/14/2007, 05/31/2012   Influenza, High Dose Seasonal PF 07/18/2018   PFIZER(Purple Top)SARS-COV-2 Vaccination 10/30/2019, 11/26/2019   Td 04/01/1998   Tdap 10/29/2011     Objective: Vital Signs: BP 109/69 (BP Location: Right Arm, Patient  Position: Sitting, Cuff Size: Normal)    Pulse 85    Ht 5' (1.524 m)    Wt 86 lb 9.6 oz (39.3 kg)    BMI 16.91 kg/m    Physical Exam Vitals and nursing note reviewed.  Constitutional:      Appearance: She is well-developed.  HENT:     Head: Normocephalic and atraumatic.  Eyes:     Conjunctiva/sclera: Conjunctivae normal.  Cardiovascular:     Rate and Rhythm: Normal rate and regular rhythm.     Heart sounds: Normal heart sounds.  Pulmonary:     Effort: Pulmonary effort is normal.     Breath sounds: Normal breath sounds.  Abdominal:     General: Bowel sounds are normal.     Palpations: Abdomen is soft.  Musculoskeletal:     Cervical back: Normal range of motion.  Lymphadenopathy:     Cervical: No cervical adenopathy.  Skin:  General: Skin is warm and dry.     Capillary Refill: Capillary refill takes less than 2 seconds.  Neurological:     Mental Status: She is alert and oriented to person, place, and time.  Psychiatric:        Behavior: Behavior normal.     Musculoskeletal Exam: C-spine was in good range of motion with some discomfort.  She had bilateral trapezius spasm.  She had discomfort range of motion of the lumbar spine without any point tenderness.  Shoulder joints, elbow joints, wrist joints, MCPs PIPs and DIPs with good range of motion with no synovitis.  Hip joints, knee joints, ankles, MTPs and PIPs with good range of motion with no synovitis.  She had tenderness over bilateral trochanteric area and SI joints.  She had bilateral PIP and DIP thickening consistent with osteoarthritis.  CDAI Exam: CDAI Score: -- Patient Global: --; Provider Global: -- Swollen: --; Tender: -- Joint Exam 09/30/2021   No joint exam has been documented for this visit   There is currently no information documented on the homunculus. Go to the Rheumatology activity and complete the homunculus joint exam.  Investigation: No additional findings.  Imaging: No results found.  Recent  Labs: Lab Results  Component Value Date   WBC 3.9 06/28/2019   HGB 12.1 06/28/2019   PLT 168 06/28/2019   NA 141 06/28/2019   K 4.2 06/28/2019   CL 101 06/28/2019   CO2 27 06/28/2019   GLUCOSE 81 06/28/2019   BUN 15 06/28/2019   CREATININE 0.86 06/28/2019   BILITOT 0.3 06/28/2019   ALKPHOS 57 06/28/2019   AST 32 06/28/2019   ALT 16 06/28/2019   PROT 6.6 06/28/2019   ALBUMIN 4.4 06/28/2019   CALCIUM 9.7 06/28/2019   GFRAA 80 06/28/2019    Speciality Comments: Narcotic Agreement Broken- [place] found on last UDS.   Procedures:  Trigger Point Inj  Date/Time: 09/30/2021 11:03 AM Performed by: Bo Merino, MD Authorized by: Bo Merino, MD   Consent Given by:  Patient Site marked: the procedure site was marked   Timeout: prior to procedure the correct patient, procedure, and site was verified   Indications:  Muscle spasm and pain Total # of Trigger Points:  2 Location: neck   Needle Size:  27 G Approach:  Dorsal Medications #1:  0.5 mL lidocaine 1 %; 10 mg triamcinolone acetonide 40 MG/ML Medications #2:  0.5 mL lidocaine 1 %; 10 mg triamcinolone acetonide 40 MG/ML Patient tolerance:  Patient tolerated the procedure well with no immediate complications Allergies: Amoxicillin-pot clavulanate, Doxycycline, and Venlafaxine   Assessment / Plan:     Visit Diagnoses: Fibromyalgia -she continues to have generalized pain and discomfort from fibromyalgia.  She takes methocarbamol 750 mg twice daily as needed for muscle spasms.  Increased risk of dizziness and falling with methocarbamol was discussed.  Advised to use methocarbamol sparingly and only on as needed basis.  Trapezius muscle spasm-she had bilateral trapezius spasm.  Per her request bilateral trapezius area were injected with cortisone and lidocaine as described above.  She tolerated the procedure well.  Postprocedure instructions were given.  Trochanteric bursitis of both hips-she had tenderness on  palpation of bilateral trochanteric area.  IT band stretches were discussed.  She also has some SI joint discomfort.  Primary osteoarthritis of both hands-bilateral PIP and DIP thickening was noted.  Joint protection was discussed.  Lateral epicondylitis of both elbows-she also had mild tenderness over lateral epicondyle region.  DDD (degenerative disc disease),  cervical-she had discomfort range of motion of her cervical spine.  DDD (degenerative disc disease), lumbar-she had limited painful range of motion of her lumbar spine.  History of scoliosis  Age-related osteoporosis without current pathological fracture - DEXA on 01/01/2020: The BMD measured at Femur Neck Right is 0.609 g/cm2 with a T-score of -3.1.  She is currently not taking any medications.  She had intolerance to bisphosphonates in the past.  Patient reports GI intolerance.  She is not interested in IV Reclast.  I discussed the option of Prolia subcutaneous.  Patient would like to read more about it.  I gave her a handout to review.  I advised her to contact us in case she wants to proceed with Prolia injections.  Calcium rich diet and exercise was emphasized.  History of vitamin D deficiency- last vitamin D level was normal.  Other insomnia-good sleep hygiene was discussed.  Other medical problems are listed as follows:  History of COPD  History of migraine  History of gastroesophageal reflux (GERD)  History of IBS  History of hyperlipidemia  History of anxiety  History of depression  History of breast cancer  Orders: Orders Placed This Encounter  Procedures   Trigger Point Inj   No orders of the defined types were placed in this encounter.    Follow-Up Instructions: Return in about 6 months (around 03/30/2022) for FMS, OA.   Bo Merino, MD  Note - This record has been created using Editor, commissioning.  Chart creation errors have been sought, but may not always  have been located. Such creation  errors do not reflect on  the standard of medical care.

## 2021-09-17 NOTE — Progress Notes (Deleted)
HPI- F former smoker (30 pk yrs) followed for COPD, complicated by Dilated Cardiomyopathy, COPD, IBS, GERD, Degenerative Disc Disease, Depression, Hx Breast Cancer/ Bilat mastectomy, no XRT, Chemo stopped due to cardiomyopathy, Fibromyalgia, PFT 03/31/17- Severe obstruction, Response to BD, DLCO mod reduced. FVC 2.20/ 83%, FEV1 1.06/ 52%, R 0.48, TLC 125%, DLCO 49%,   ==============================================================================   03/06/21- 36 yoF former smoker (30 pk yrs) followed for COPD, complicated by Dilated Cardiomyopathy, COPD, IBS, GERD, Degenerative Disc Disease, Depression, Hx Breast Cancer/ Bilat mastectomy, no XRT, Chemo stopped due to cardiomyopathy, Fibromyalgia,  --Proair hfa, Breztri,  Had recurrent thrush> Diflucan -----Not had albuterol in 3 days, more SOB with activities No obvious infection or acute event CXR 05/18/2019-  IMPRESSION: Marked hyperinflation and severe upper lobe predominant emphysema. Nodular opacity in the left lung base is stable from 1962, etiology uncertain. The patient may benefit from screening low-dose chest CT.  09/18/21- 70 yoF former smoker (30 pk yrs) followed for COPD, complicated by Dilated Cardiomyopathy, COPD, IBS, GERD, Degenerative Disc Disease, Depression, Hx Breast Cancer/ Bilat mastectomy, no XRT, Chemo stopped due to cardiomyopathy, Fibromyalgia,  --Proair hfa,  Incruse,   CXR 03/07/21- IMPRESSION: COPD.  No active cardiopulmonary disease.   ROS-see HPI   + = positive Constitutional:    weight loss, night sweats, fevers, chills, fatigue, lassitude. HEENT:    headaches, difficulty swallowing, tooth/dental problems, sore throat,       sneezing, itching, ear ache, nasal congestion, post nasal drip, snoring CV:    chest pain, orthopnea, PND, swelling in lower extremities, anasarca,                                   dizziness, palpitations Resp:  + shortness of breath with exertion or at rest.                productive  cough,   non-productive cough, coughing up of blood.              change in color of mucus.  wheezing.   Skin:    rash or lesions. GI:  No-   heartburn, indigestion, abdominal pain, nausea, vomiting, diarrhea,                 change in bowel habits, loss of appetite GU: dysuria, change in color of urine, no urgency or frequency.   flank pain. MS:   joint pain, stiffness, decreased range of motion, back pain. Neuro-     nothing unusual Psych:  change in mood or affect.  depression or anxiety.   memory loss.  OBJ- Physical Exam General- Alert, Oriented, Affect-appropriate, Distress- none acute, petite Skin- rash-none, lesions- none, excoriation- none Lymphadenopathy- none Head- atraumatic            Eyes- Gross vision intact, PERRLA, conjunctivae and secretions clear            Ears- Hearing, canals-normal            Nose- Clear, no-Septal dev, mucus, polyps, erosion, perforation             Throat- Mallampati II-III , mucosa clear , drainage- none, tonsils- atrophic Neck- flexible , trachea midline, no stridor , thyroid nl, carotid no bruit Chest - symmetrical excursion , unlabored           Heart/CV- RRR , no murmur , no gallop  , no rub, nl s1 s2                           -  JVD- none , edema- none, stasis changes- none, varices- none           Lung- clear to P&A/ distant, wheeze- none, cough- none , dullness-none, rub- none           Chest wall- +bilateral reconstruction after breast cancer Abd-  Br/ Gen/ Rectal- Not done, not indicated Extrem- cyanosis- none, clubbing, none, atrophy- none, strength- nl Neuro- grossly intact to observation

## 2021-09-18 ENCOUNTER — Ambulatory Visit: Payer: Medicare Other | Admitting: Internal Medicine

## 2021-09-25 ENCOUNTER — Telehealth: Payer: Self-pay | Admitting: *Deleted

## 2021-09-25 NOTE — Telephone Encounter (Signed)
Received a potential drug to drug interaction from [employer].   Possible interaction between: Methocarbamol >= 72 years old.  Possible Risks: risk of sedation, increased risk of fracture and have questionable efficacy in patients 101 years of age and older.   Reviewed by: Hazel Sams, PA-C  Recommendations: Please make patient aware of this alert we received. Please see if she is willing to try reducing Robaxin to 500 mg BID or 750 mg once daily PRN.   Attempted to contact the patient and left message for patient to call the office.

## 2021-09-30 ENCOUNTER — Other Ambulatory Visit: Payer: Self-pay

## 2021-09-30 ENCOUNTER — Encounter: Payer: Self-pay | Admitting: Rheumatology

## 2021-09-30 ENCOUNTER — Ambulatory Visit: Payer: Medicare Other | Admitting: Rheumatology

## 2021-09-30 VITALS — BP 109/69 | HR 85 | Ht 60.0 in | Wt 86.6 lb

## 2021-09-30 DIAGNOSIS — M5030 Other cervical disc degeneration, unspecified cervical region: Secondary | ICD-10-CM

## 2021-09-30 DIAGNOSIS — M810 Age-related osteoporosis without current pathological fracture: Secondary | ICD-10-CM | POA: Diagnosis not present

## 2021-09-30 DIAGNOSIS — Z8739 Personal history of other diseases of the musculoskeletal system and connective tissue: Secondary | ICD-10-CM | POA: Diagnosis not present

## 2021-09-30 DIAGNOSIS — M19041 Primary osteoarthritis, right hand: Secondary | ICD-10-CM | POA: Diagnosis not present

## 2021-09-30 DIAGNOSIS — Z8719 Personal history of other diseases of the digestive system: Secondary | ICD-10-CM

## 2021-09-30 DIAGNOSIS — M62838 Other muscle spasm: Secondary | ICD-10-CM | POA: Diagnosis not present

## 2021-09-30 DIAGNOSIS — M7061 Trochanteric bursitis, right hip: Secondary | ICD-10-CM

## 2021-09-30 DIAGNOSIS — M797 Fibromyalgia: Secondary | ICD-10-CM | POA: Diagnosis not present

## 2021-09-30 DIAGNOSIS — Z8709 Personal history of other diseases of the respiratory system: Secondary | ICD-10-CM

## 2021-09-30 DIAGNOSIS — Z8639 Personal history of other endocrine, nutritional and metabolic disease: Secondary | ICD-10-CM | POA: Diagnosis not present

## 2021-09-30 DIAGNOSIS — G4709 Other insomnia: Secondary | ICD-10-CM

## 2021-09-30 DIAGNOSIS — Z8669 Personal history of other diseases of the nervous system and sense organs: Secondary | ICD-10-CM

## 2021-09-30 DIAGNOSIS — M5136 Other intervertebral disc degeneration, lumbar region: Secondary | ICD-10-CM

## 2021-09-30 DIAGNOSIS — M7711 Lateral epicondylitis, right elbow: Secondary | ICD-10-CM

## 2021-09-30 DIAGNOSIS — M7712 Lateral epicondylitis, left elbow: Secondary | ICD-10-CM

## 2021-09-30 DIAGNOSIS — M19042 Primary osteoarthritis, left hand: Secondary | ICD-10-CM

## 2021-09-30 DIAGNOSIS — M7062 Trochanteric bursitis, left hip: Secondary | ICD-10-CM

## 2021-09-30 DIAGNOSIS — Z853 Personal history of malignant neoplasm of breast: Secondary | ICD-10-CM

## 2021-09-30 DIAGNOSIS — Z8659 Personal history of other mental and behavioral disorders: Secondary | ICD-10-CM

## 2021-09-30 MED ORDER — TRIAMCINOLONE ACETONIDE 40 MG/ML IJ SUSP
10.0000 mg | INTRAMUSCULAR | Status: AC | PRN
  Administered 2021-09-30: 10 mg via INTRAMUSCULAR

## 2021-09-30 MED ORDER — LIDOCAINE HCL 1 % IJ SOLN
0.5000 mL | INTRAMUSCULAR | Status: AC | PRN
  Administered 2021-09-30: .5 mL

## 2021-09-30 NOTE — Patient Instructions (Signed)
Iliotibial Band Syndrome Rehab Ask your health care provider which exercises are safe for you. Do exercises exactly as told by your health care provider and adjust them as directed. It is normal to feel mild stretching, pulling, tightness, or discomfort as you do these exercises. Stop right away if you feel sudden pain or your pain gets significantly worse. Do not begin these exercises until told by your health care provider. Stretching and range-of-motion exercises These exercises warm up your muscles and joints and improve the movement and flexibility of your hip and pelvis. Quadriceps stretch, prone  Lie on your abdomen (prone position) on a firm surface, such as a bed or padded floor. Bend your left / right knee and reach back to hold your ankle or pant leg. If you cannot reach your ankle or pant leg, loop a belt around your foot and grab the belt instead. Gently pull your heel toward your buttocks. Your knee should not slide out to the side. You should feel a stretch in the front of your thigh and knee (quadriceps). Hold this position for __________ seconds. Repeat __________ times. Complete this exercise __________ times a day. Iliotibial band stretch An iliotibial band is a strong band of muscle tissue that runs from the outer side of your hip to the outer side of your thigh and knee. Lie on your side with your left / right leg in the top position. Bend both of your knees and grab your left / right ankle. Stretch out your bottom arm to help you balance. Slowly bring your top knee back so your thigh goes behind your trunk. Slowly lower your top leg toward the floor until you feel a gentle stretch on the outside of your left / right hip and thigh. If you do not feel a stretch and your knee will not fall farther, place the heel of your other foot on top of your knee and pull your knee down toward the floor with your foot. Hold this position for __________ seconds. Repeat __________ times.  Complete this exercise __________ times a day. Strengthening exercises These exercises build strength and endurance in your hip and pelvis. Endurance is the ability to use your muscles for a long time, even after they get tired. Straight leg raises, side-lying This exercise strengthens the muscles that rotate the leg at the hip and move it away from your body (hip abductors). Lie on your side with your left / right leg in the top position. Lie so your head, shoulder, hip, and knee line up. You may bend your bottom knee to help you balance. Roll your hips slightly forward so your hips are stacked directly over each other and your left / right knee is facing forward. Tense the muscles in your outer thigh and lift your top leg 4-6 inches (10-15 cm). Hold this position for __________ seconds. Slowly lower your leg to return to the starting position. Let your muscles relax completely before doing another repetition. Repeat __________ times. Complete this exercise __________ times a day. Leg raises, prone This exercise strengthens the muscles that move the hips backward (hip extensors). Lie on your abdomen (prone position) on your bed or a firm surface. You can put a pillow under your hips if that is more comfortable for your lower back. Bend your left / right knee so your foot is straight up in the air. Squeeze your buttocks muscles and lift your left / right thigh off the bed. Do not let your back arch. Tense  your thigh muscle as hard as you can without increasing any knee pain. Hold this position for __________ seconds. Slowly lower your leg to return to the starting position and allow it to relax completely. Repeat __________ times. Complete this exercise __________ times a day. Hip hike Stand sideways on a bottom step. Stand on your left / right leg with your other foot unsupported next to the step. You can hold on to a railing or wall for balance if needed. Keep your knees straight and your  torso square. Then lift your left / right hip up toward the ceiling. Slowly let your left / right hip lower toward the floor, past the starting position. Your foot should get closer to the floor. Do not lean or bend your knees. Repeat __________ times. Complete this exercise __________ times a day. This information is not intended to replace advice given to you by your health care provider. Make sure you discuss any questions you have with your health care provider. Document Revised: 10/25/2019 Document Reviewed: 10/25/2019 Elsevier Patient Education  [place]. Denosumab injection What is this medication? DENOSUMAB (den oh sue mab) slows bone breakdown. Prolia is used to treat osteoporosis in women after menopause and in men, and in people who are taking corticosteroids for 6 months or more. Delton See is used to treat a high calcium level due to cancer and to prevent bone fractures and other bone problems caused by multiple myeloma or cancer bone metastases. Delton See is also used to treat giant cell tumor of the bone. This medicine may be used for other purposes; ask your health care provider or pharmacist if you have questions. COMMON BRAND NAME(S): Prolia, XGEVA What should I tell my care team before I take this medication? They need to know if you have any of these conditions: dental disease having surgery or tooth extraction infection kidney disease low levels of calcium or Vitamin D in the blood malnutrition on hemodialysis skin conditions or sensitivity thyroid or parathyroid disease an unusual reaction to denosumab, other medicines, foods, dyes, or preservatives pregnant or trying to get pregnant breast-feeding How should I use this medication? This medicine is for injection under the skin. It is given by a health care professional in a hospital or clinic setting. A special MedGuide will be given to you before each treatment. Be sure to read this information carefully each  time. For Prolia, talk to your pediatrician regarding the use of this medicine in children. Special care may be needed. For Delton See, talk to your pediatrician regarding the use of this medicine in children. While this drug may be prescribed for children as young as 13 years for selected conditions, precautions do apply. Overdosage: If you think you have taken too much of this medicine contact a poison control center or emergency room at once. NOTE: This medicine is only for you. Do not share this medicine with others. What if I miss a dose? It is important not to miss your dose. Call your doctor or health care professional if you are unable to keep an appointment. What may interact with this medication? Do not take this medicine with any of the following medications: other medicines containing denosumab This medicine may also interact with the following medications: medicines that lower your chance of fighting infection steroid medicines like prednisone or cortisone This list may not describe all possible interactions. Give your health care provider a list of all the medicines, herbs, non-prescription drugs, or dietary supplements you use. Also tell them  if you smoke, drink alcohol, or use illegal drugs. Some items may interact with your medicine. What should I watch for while using this medication? Visit your doctor or health care professional for regular checks on your progress. Your doctor or health care professional may order blood tests and other tests to see how you are doing. Call your doctor or health care professional for advice if you get a fever, chills or sore throat, or other symptoms of a cold or flu. Do not treat yourself. This drug may decrease your body's ability to fight infection. Try to avoid being around people who are sick. You should make sure you get enough calcium and vitamin D while you are taking this medicine, unless your doctor tells you not to. Discuss the foods you eat  and the vitamins you take with your health care professional. See your dentist regularly. Brush and floss your teeth as directed. Before you have any dental work done, tell your dentist you are receiving this medicine. Do not become pregnant while taking this medicine or for 5 months after stopping it. Talk with your doctor or health care professional about your birth control options while taking this medicine. Women should inform their doctor if they wish to become pregnant or think they might be pregnant. There is a potential for serious side effects to an unborn child. Talk to your health care professional or pharmacist for more information. What side effects may I notice from receiving this medication? Side effects that you should report to your doctor or health care professional as soon as possible: allergic reactions like skin rash, itching or hives, swelling of the face, lips, or tongue bone pain breathing problems dizziness jaw pain, especially after dental work redness, blistering, peeling of the skin signs and symptoms of infection like fever or chills; cough; sore throat; pain or trouble passing urine signs of low calcium like fast heartbeat, muscle cramps or muscle pain; pain, tingling, numbness in the hands or feet; seizures unusual bleeding or bruising unusually weak or tired Side effects that usually do not require medical attention (report to your doctor or health care professional if they continue or are bothersome): constipation diarrhea headache joint pain loss of appetite muscle pain runny nose tiredness upset stomach This list may not describe all possible side effects. Call your doctor for medical advice about side effects. You may report side effects to FDA at 1-800-FDA-1088. Where should I keep my medication? This medicine is only given in a clinic, doctor's office, or other health care setting and will not be stored at home. NOTE: This sheet is a summary. It may  not cover all possible information. If you have questions about this medicine, talk to your doctor, pharmacist, or health care provider.  2022 Elsevier/Gold Standard (2017-12-24 00:00:00)

## 2021-10-01 NOTE — Telephone Encounter (Signed)
Attempted to contact the patient and left message for patient to call the office.  

## 2021-10-15 ENCOUNTER — Other Ambulatory Visit: Payer: Self-pay | Admitting: Physician Assistant

## 2021-10-15 NOTE — Telephone Encounter (Signed)
Next Visit: 03/27/2022  Last Visit: 09/30/2021  Last Fill: 07/21/2021  Dx: Fibromyalgia  Current Dose per office note on 09/30/2021: methocarbamol 750 mg twice daily as needed for muscle spasms.  Okay to refill Methocarbamol?

## 2022-01-17 ENCOUNTER — Other Ambulatory Visit: Payer: Self-pay | Admitting: Physician Assistant

## 2022-01-19 NOTE — Telephone Encounter (Signed)
Next Visit: 03/27/2022   Last Visit: 09/30/2021   Last Fill: 10/15/2021   Dx: Fibromyalgia   Current Dose per office note on 09/30/2021: methocarbamol 750 mg twice daily as needed for muscle spasms.   Okay to refill Methocarbamol?

## 2022-02-26 NOTE — Progress Notes (Signed)
Office Visit Note  Patient: [name]             Date of Birth: Aug 17, 1951           MRN: [id number]             PCP: Rachael Reid, PA-C Referring: Rachael Reid, PA-C Visit Date: 03/06/2022 Occupation: '@GUAROCC'$ @  Subjective:  Trapezius muscle tenderness   History of Present Illness: [name] is a 71 y.o. female with history of fibromyalgia, osteoarthritis, and DDD.  She presents today with trapezius muscle tension and tenderness bilaterally.  She has been experiencing significant discomfort for the past 1 month.  She experiences muscle spasms intermittently.  She is taking robaxin 750 mg BID for muscle spasms which helps to alleviate her symptoms.  She requested trigger point injections today.  She continues to have discomfort due to trochanter bursitis of both hips.  She has intermittent myalgias and muscle tenderness due to fibromyalgia.  She denies any other new or worsening symptoms.  She denies any joint swelling.   Activities of Daily Living:  Patient reports morning stiffness for 1 hour.   Patient Reports nocturnal pain.  Difficulty dressing/grooming: Denies Difficulty climbing stairs: Reports Difficulty getting out of chair: Reports Difficulty using hands for taps, buttons, cutlery, and/or writing: Reports  Review of Systems  Constitutional:  Positive for fatigue.  HENT:  Negative for mouth sores, mouth dryness and nose dryness.   Eyes:  Negative for pain, itching and dryness.  Respiratory:  Negative for shortness of breath and difficulty breathing.   Cardiovascular:  Negative for chest pain and palpitations.  Gastrointestinal:  Negative for blood in stool, constipation and diarrhea.  Endocrine: Negative for increased urination.  Genitourinary:  Negative for difficulty urinating.  Musculoskeletal:  Positive for joint pain, joint pain, myalgias, morning stiffness, muscle tenderness and myalgias. Negative for joint swelling.  Skin:  Negative for  color change, rash and redness.  Allergic/Immunologic: Negative for susceptible to infections.  Neurological:  Positive for dizziness and headaches. Negative for memory loss.  Hematological:  Positive for bruising/bleeding tendency.  Psychiatric/Behavioral:  Negative for confusion.     PMFS History:  Patient Active Problem List   Diagnosis Date Noted   Nasal congestion 09/06/2020   History of thrush 09/22/2019   Estrogen deficiency 06/28/2019   Post-menopausal 06/28/2019   Mood disorder ([place]) 03/17/2018   Osteopenia after menopause 11/06/2017   Medial epicondylitis of right elbow 10/14/2017   MDD (major depressive disorder), severe ([place]) 09/22/2017   MDD (major depressive disorder), recurrent episode, moderate ([facility])    Prolonged Q-T interval on ECG 09/20/2017   Overdose 09/20/2017   Acute cystitis with hematuria    Suicide attempt [facility])    Family history of diabetes mellitus- mom in 50's 2017/08/30    Class: Chronic   Family history of colon cancer- sister died 45yo 2017/08/30   Adjustment disorder with mixed anxiety < depressed mood 08-30-17   B12 deficiency 08-30-17   Fibromyalgia syndrome 11/23/2016   Other insomnia 11/23/2016   Trapezius muscle spasm 11/23/2016   DDD (degenerative disc disease), cervical 11/23/2016   DDD (degenerative disc disease), lumbar 11/23/2016   Other idiopathic scoliosis, lumbar region 11/23/2016   Malignant neoplasm of female breast ([place]) 11/23/2016   History of gastroesophageal reflux (GERD) 11/23/2016   PCP NOTES >>>>>>>>>>>>>>>>>>>> 11/21/2015   Constipation 02/15/2015   Annual physical exam 11/20/2013   COPD mixed type ([place]) 11/20/2013   Unspecified adverse effect of unspecified drug, medicinal and biological  substance 12/10/2011   Vitamin D deficiency 07/06/2011   CARDIOMYOPATHY, PRIMARY, DILATED 01/01/2009   BREAST CANCER, HX OF 11/02/2008   h/o HYPERLIPIDEMIA 10/18/2007   GERD 10/18/2007   ANEMIA, B12 DEFICIENCY 10/12/2007    Anxiety,depression, insomnia 10/12/2007   IBS 10/12/2007   Primary osteoarthritis of both hands 10/12/2007   Osteoporosis 10/12/2007   COLONIC POLYPS, HX OF 10/12/2007   MIGRAINES, HX OF 10/12/2007   Fibromyalgia--Dr. Herold Harms, on Ultram 07/14/2007    Past Medical History:  Diagnosis Date   Anemia    B12 deficient   B12 deficiency    Breast cancer ([place])    left breast.   Chronic SI joint pain    Bilateral, Dr. Estanislado Pandy   Fibromyalgia    Dr. Estanislado Pandy   GERD (gastroesophageal reflux disease)    Greater trochanteric bursitis of both hips    Dr. Estanislado Pandy   Hyperlipidemia    IBS (irritable bowel syndrome)    LV dysfunction    iatrogenic from chemotherapy ; on Carvedilol    Migraines    Osteoporosis    Dr Lomax---> transferring to a new gyn    Family History  Problem Relation Age of Onset   Diabetes Mother    Breast cancer Mother    Depression Mother        anxiety   Heart disease Mother        in her 2s   Colon cancer Sister    Healthy Daughter    Stroke Neg Hx    Past Surgical History:  Procedure Laterality Date   ABDOMINAL HYSTERECTOMY     BSO for  Endometriosis   CARPAL TUNNEL RELEASE     left   CERVICAL DISC SURGERY     COLONOSCOPY W/ POLYPECTOMY  1999   Dr Earlean Shawl   ESOPHAGEAL DILATION  2005   Dr Earlean Shawl   MASTECTOMY  2009   bilateral, Dr Boyce Medici     Social History   Social History Narrative   Lives w/ husband   Immunization History  Administered Date(s) Administered   Influenza Whole 07/14/2007, 05/31/2012   Influenza, High Dose Seasonal PF 07/18/2018   PFIZER(Purple Top)SARS-COV-2 Vaccination 10/30/2019, 11/26/2019   Td 04/01/1998   Tdap 10/29/2011     Objective: Vital Signs: BP 111/71 (BP Location: Left Arm, Patient Position: Sitting, Cuff Size: Normal)   Pulse 76   Ht 5' (1.524 m)   Wt 84 lb 6.4 oz (38.3 kg)   BMI 16.48 kg/m    Physical Exam Vitals and nursing note reviewed.  Constitutional:      Appearance: She is  well-developed.  HENT:     Head: Normocephalic and atraumatic.  Eyes:     Conjunctiva/sclera: Conjunctivae normal.  Cardiovascular:     Rate and Rhythm: Normal rate and regular rhythm.     Heart sounds: Normal heart sounds.  Pulmonary:     Effort: Pulmonary effort is normal.     Breath sounds: Normal breath sounds.  Abdominal:     General: Bowel sounds are normal.     Palpations: Abdomen is soft.  Musculoskeletal:     Cervical back: Normal range of motion.  Skin:    General: Skin is warm and dry.     Capillary Refill: Capillary refill takes less than 2 seconds.  Neurological:     Mental Status: She is alert and oriented to person, place, and time.  Psychiatric:        Behavior: Behavior normal.  Musculoskeletal Exam: Generalized hyperalgesia and positive tender points on examination today.  C-spine has limited range of motion.  Trapezius muscle tension tenderness bilaterally.  No midline spinal tenderness.  Shoulder joints have good range of motion.  Elbow joints, wrist joints, MCPs, PIPs, DIPs have good range of motion with no synovitis.  Complete fist formation bilaterally.  Hip joints have good range of motion with no groin pain.  Tenderness palpation over bilateral trochanteric bursa.  Knee joints have good range of motion with no warmth or effusion.  Ankle joints have good range of motion with no tenderness or joint swelling.  CDAI Exam: CDAI Score: -- Patient Global: --; Provider Global: -- Swollen: --; Tender: -- Joint Exam 03/06/2022   No joint exam has been documented for this visit   There is currently no information documented on the homunculus. Go to the Rheumatology activity and complete the homunculus joint exam.  Investigation: No additional findings.  Imaging: No results found.  Recent Labs: Lab Results  Component Value Date   WBC 3.9 06/28/2019   HGB 12.1 06/28/2019   PLT 168 06/28/2019   NA 141 06/28/2019   K 4.2 06/28/2019   CL 101 06/28/2019    CO2 27 06/28/2019   GLUCOSE 81 06/28/2019   BUN 15 06/28/2019   CREATININE 0.86 06/28/2019   BILITOT 0.3 06/28/2019   ALKPHOS 57 06/28/2019   AST 32 06/28/2019   ALT 16 06/28/2019   PROT 6.6 06/28/2019   ALBUMIN 4.4 06/28/2019   CALCIUM 9.7 06/28/2019   GFRAA 80 06/28/2019    Speciality Comments: Narcotic Agreement Broken- [place] found on last UDS.   Procedures:  Trigger Point Inj  Date/Time: 03/06/2022 10:36 AM  Performed by: Ofilia Neas, PA-C Authorized by: Ofilia Neas, PA-C   Consent Given by:  Patient Site marked: the procedure site was marked   Timeout: prior to procedure the correct patient, procedure, and site was verified   Indications:  Pain Total # of Trigger Points:  2 Location: neck   Needle Size:  27 G Approach:  Dorsal Medications #1:  0.5 mL lidocaine 1 %; 10 mg triamcinolone acetonide 40 MG/ML Medications #2:  0.5 mL lidocaine 1 %; 10 mg triamcinolone acetonide 40 MG/ML Patient tolerance:  Patient tolerated the procedure well with no immediate complications  Allergies: Amoxicillin-pot clavulanate, Doxycycline, and Venlafaxine   Assessment / Plan:     Visit Diagnoses: Fibromyalgia - She has generalized hyperalgesia and positive tender points on examination.  She presents today with trapezius muscle tension and tenderness bilaterally.  She has been experiencing increased discomfort in the past 1 month.  She experiences muscle spasms intermittently.  She has been taking methocarbamol 750 mg twice daily for muscle spasms.  She requested trigger point injections today.  She tolerated procedure well.  Procedure note was completed above.  Aftercare was discussed.  Discussed the importance of regular exercise and good sleep hygiene.  She will follow-up in the office in 6 months or sooner if needed.    Trapezius muscle spasm: She presents today with trapezius muscle tension and tenderness bilaterally.  Her symptoms have progressively been worsening over the  past 1 month.  She experiences muscle spasms intermittently.  She is been taking methocarbamol 750 mg twice daily for symptomatic relief.  Her symptoms are typically alleviated by trigger point injections.  She requested trigger point injections bilaterally.  She tolerated procedures well.  Procedure notes were completed above.  Aftercare was discussed.  Trochanteric bursitis of both  hips: She has tenderness palpation over bilateral trochanteric bursa.  Discussed the importance of performing stretching exercises daily.  Primary osteoarthritis of both hands: She has PIP and DIP thickening consistent with osteoarthritis of both hands.  Complete fist formation bilaterally.  Discussed the importance of joint protection and muscle strengthening.  Lateral epicondylitis of both elbows: Tenderness upon palpation bilaterally.  DDD (degenerative disc disease), cervical: C-spine has limited range of motion without rotation.  Trapezius muscle tension tenderness bilaterally.  Trigger point injections were performed today.  DDD (degenerative disc disease), lumbar: No midline spinal tenderness or symptoms of radiculopathy at this time.  History of scoliosis  Age-related osteoporosis without current pathological fracture - DEXA on 01/01/2020: The BMD measured at Femur Neck Right is 0.609 g/cm2 with a T-score of -3.1.  She is taking vitamin D 50,000 units once weekly and a calcium supplement daily.  History of vitamin D deficiency: She is taking vitamin D 50,000 units once weekly.  Other insomnia: She continues to experience interrupted sleep at night.  Discussed the importance of good sleep hygiene.  Other medical conditions are listed as follows:  History of COPD  History of migraine  History of gastroesophageal reflux (GERD)  History of IBS  History of hyperlipidemia  History of anxiety  History of depression  History of breast cancer  Orders: Orders Placed This Encounter  Procedures    Trigger Point Inj   No orders of the defined types were placed in this encounter.    Follow-Up Instructions: Return in about 6 months (around 09/06/2022) for Fibromyalgia, Osteoarthritis, DDD.   Ofilia Neas, PA-C  Note - This record has been created using Dragon software.  Chart creation errors have been sought, but may not always  have been located. Such creation errors do not reflect on  the standard of medical care.

## 2022-03-06 ENCOUNTER — Ambulatory Visit: Payer: Medicare Other | Admitting: Physician Assistant

## 2022-03-06 ENCOUNTER — Encounter: Payer: Self-pay | Admitting: Physician Assistant

## 2022-03-06 VITALS — BP 111/71 | HR 76 | Ht 60.0 in | Wt 84.4 lb

## 2022-03-06 DIAGNOSIS — Z8639 Personal history of other endocrine, nutritional and metabolic disease: Secondary | ICD-10-CM

## 2022-03-06 DIAGNOSIS — M7711 Lateral epicondylitis, right elbow: Secondary | ICD-10-CM | POA: Diagnosis not present

## 2022-03-06 DIAGNOSIS — G4709 Other insomnia: Secondary | ICD-10-CM | POA: Diagnosis not present

## 2022-03-06 DIAGNOSIS — M5030 Other cervical disc degeneration, unspecified cervical region: Secondary | ICD-10-CM | POA: Diagnosis not present

## 2022-03-06 DIAGNOSIS — Z8739 Personal history of other diseases of the musculoskeletal system and connective tissue: Secondary | ICD-10-CM | POA: Diagnosis not present

## 2022-03-06 DIAGNOSIS — M5136 Other intervertebral disc degeneration, lumbar region: Secondary | ICD-10-CM | POA: Diagnosis not present

## 2022-03-06 DIAGNOSIS — Z8719 Personal history of other diseases of the digestive system: Secondary | ICD-10-CM

## 2022-03-06 DIAGNOSIS — M19042 Primary osteoarthritis, left hand: Secondary | ICD-10-CM

## 2022-03-06 DIAGNOSIS — M7061 Trochanteric bursitis, right hip: Secondary | ICD-10-CM | POA: Diagnosis not present

## 2022-03-06 DIAGNOSIS — Z853 Personal history of malignant neoplasm of breast: Secondary | ICD-10-CM

## 2022-03-06 DIAGNOSIS — Z8709 Personal history of other diseases of the respiratory system: Secondary | ICD-10-CM | POA: Diagnosis not present

## 2022-03-06 DIAGNOSIS — M810 Age-related osteoporosis without current pathological fracture: Secondary | ICD-10-CM

## 2022-03-06 DIAGNOSIS — Z8659 Personal history of other mental and behavioral disorders: Secondary | ICD-10-CM

## 2022-03-06 DIAGNOSIS — M62838 Other muscle spasm: Secondary | ICD-10-CM

## 2022-03-06 DIAGNOSIS — Z8669 Personal history of other diseases of the nervous system and sense organs: Secondary | ICD-10-CM

## 2022-03-06 DIAGNOSIS — M797 Fibromyalgia: Secondary | ICD-10-CM

## 2022-03-06 DIAGNOSIS — M7712 Lateral epicondylitis, left elbow: Secondary | ICD-10-CM

## 2022-03-06 DIAGNOSIS — M19041 Primary osteoarthritis, right hand: Secondary | ICD-10-CM

## 2022-03-06 DIAGNOSIS — M7062 Trochanteric bursitis, left hip: Secondary | ICD-10-CM

## 2022-03-06 MED ORDER — TRIAMCINOLONE ACETONIDE 40 MG/ML IJ SUSP
10.0000 mg | INTRAMUSCULAR | Status: AC | PRN
  Administered 2022-03-06: 10 mg via INTRAMUSCULAR

## 2022-03-06 MED ORDER — LIDOCAINE HCL 1 % IJ SOLN
0.5000 mL | INTRAMUSCULAR | Status: AC | PRN
  Administered 2022-03-06: .5 mL

## 2022-03-09 ENCOUNTER — Other Ambulatory Visit: Payer: Self-pay | Admitting: Primary Care

## 2022-03-09 DIAGNOSIS — J449 Chronic obstructive pulmonary disease, unspecified: Secondary | ICD-10-CM

## 2022-03-10 ENCOUNTER — Ambulatory Visit: Payer: Medicare Other

## 2022-03-27 ENCOUNTER — Ambulatory Visit: Payer: Medicare Other | Admitting: Physician Assistant

## 2022-03-29 ENCOUNTER — Other Ambulatory Visit: Payer: Self-pay | Admitting: Internal Medicine

## 2022-03-29 DIAGNOSIS — J449 Chronic obstructive pulmonary disease, unspecified: Secondary | ICD-10-CM

## 2022-04-16 ENCOUNTER — Telehealth: Payer: Self-pay | Admitting: Internal Medicine

## 2022-04-16 MED ORDER — UMECLIDINIUM BROMIDE 62.5 MCG/ACT IN AEPB: 1.0000 | INHALATION_SPRAY | Freq: Every day | RESPIRATORY_TRACT | 1 refills | Status: DC

## 2022-04-16 NOTE — Telephone Encounter (Signed)
Refill sent in. Noted that patient needs an office visit for more refills. Last seen in 2022. Nothing further needed

## 2022-04-19 NOTE — Progress Notes (Signed)
HPI- F former smoker (30 pk yrs) followed for COPD, complicated by Dilated Cardiomyopathy, COPD, IBS, GERD, Degenerative Disc Disease, Depression, Hx Breast Cancer/ Bilat mastectomy, no XRT, Chemo stopped due to cardiomyopathy, Fibromyalgia, PFT 03/31/17- Severe obstruction, Response to BD, DLCO mod reduced. FVC 2.20/ 83%, FEV1 1.06/ 52%, R 0.48, TLC 125%, DLCO 49%,   ==============================================================================   03/06/21- 59 yoF former smoker (30 pk yrs) followed for COPD, complicated by Dilated Cardiomyopathy, COPD, IBS, GERD, Degenerative Disc Disease, Depression, Hx Breast Cancer/ Bilat mastectomy, no XRT, Chemo stopped due to cardiomyopathy, Fibromyalgia,  --Proair hfa, Breztri,  Had recurrent thrush> Diflucan -----Not had albuterol in 3 days, more SOB with activities No obvious infection or acute event CXR 05/18/2019-  IMPRESSION: Marked hyperinflation and severe upper lobe predominant emphysema. Nodular opacity in the left lung base is stable from 0272, etiology uncertain. The patient may benefit from screening low-dose chest CT.1 04/21/22- 69 yoF former smoker (30 pk yrs) followed for COPD, complicated by Dilated Cardiomyopathy, COPD, IBS, GERD, Degenerative Disc Disease, Depression, Hx Breast Cancer/ Bilat mastectomy, no XRT, Chemo stopped due to cardiomyopathy, Fibromyalgia,  --Proair hfa, Incruse or Trelegy??   Steroid inhalers> thrush?? CXR 03/07/21-  IMPRESSION: COPD.  No active cardiopulmonary disease.  04/21/22- 27 yoF former smoker (30 pk yrs) followed for COPD, complicated by Dilated Cardiomyopathy, COPD, IBS, GERD, Degenerative Disc Disease, Depression, Hx Breast Cancer/ Bilat mastectomy, no XRT, Chemo stopped due to cardiomyopathy, Fibromyalgia,  --Proair hfa, Breztri> Incruse -----Pt f/u for COPD some SOB, and wheezing. No coughing to report. Inhalers are working, no questions or concerns. She feels Incruse inhaler working fine for her.  No  acute respiratory issues, routine cough or wheeze. Describes orthostatic lightheadedness if she gets a little dehydrated.  Discussed this especially in current summer heat.  ROS-see HPI   + = positive Constitutional:    weight loss, night sweats, fevers, chills, fatigue, lassitude. HEENT:    headaches, difficulty swallowing, tooth/dental problems, sore throat,       sneezing, itching, ear ache, nasal congestion, post nasal drip, snoring CV:    chest pain, orthopnea, PND, swelling in lower extremities, anasarca,                                   dizziness, palpitations Resp:  + shortness of breath with exertion or at rest.                productive cough,   non-productive cough, coughing up of blood.              change in color of mucus.  wheezing.   Skin:    rash or lesions. GI:  No-   heartburn, indigestion, abdominal pain, nausea, vomiting, diarrhea,                 change in bowel habits, loss of appetite GU: dysuria, change in color of urine, no urgency or frequency.   flank pain. MS:   joint pain, stiffness, decreased range of motion, back pain. Neuro-     nothing unusual Psych:  change in mood or affect.  depression or anxiety.   memory loss.  OBJ- Physical Exam General- Alert, Oriented, Affect-appropriate, Distress- none acute, petite Skin- rash-none, lesions- none, excoriation- none Lymphadenopathy- none Head- atraumatic            Eyes- Gross vision intact, PERRLA, conjunctivae and secretions clear  Ears- Hearing, canals-normal            Nose- Clear, no-Septal dev, mucus, polyps, erosion, perforation             Throat- Mallampati II-III , mucosa clear , drainage- none, tonsils- atrophic Neck- flexible , trachea midline, no stridor , thyroid nl, carotid no bruit Chest - symmetrical excursion , unlabored           Heart/CV- RRR , no murmur , no gallop  , no rub, nl s1 s2                           - JVD- none , edema- none, stasis changes- none, varices- none            Lung- +coarse in bases/ distant, wheeze- none, cough- none , dullness-none, rub- none           Chest wall- +bilateral reconstruction after breast cancer Abd-  Br/ Gen/ Rectal- Not done, not indicated Extrem- cyanosis- none, clubbing, none, atrophy- none, strength- nl Neuro- grossly intact to observation

## 2022-04-21 ENCOUNTER — Encounter: Payer: Self-pay | Admitting: Internal Medicine

## 2022-04-21 ENCOUNTER — Ambulatory Visit: Payer: Medicare Other | Admitting: Internal Medicine

## 2022-04-21 ENCOUNTER — Ambulatory Visit (INDEPENDENT_AMBULATORY_CARE_PROVIDER_SITE_OTHER): Payer: Medicare Other

## 2022-04-21 VITALS — BP 82/58 | HR 77 | Ht 60.0 in | Wt 85.0 lb

## 2022-04-21 DIAGNOSIS — J449 Chronic obstructive pulmonary disease, unspecified: Secondary | ICD-10-CM

## 2022-04-21 NOTE — Patient Instructions (Signed)
Order-CXR    dx COPD mixed type  We can continue current meds  Please call if we can help

## 2022-05-06 ENCOUNTER — Encounter: Payer: Self-pay | Admitting: Internal Medicine

## 2022-05-06 NOTE — Assessment & Plan Note (Signed)
I asked her to discuss the status of her cardiomyopathy and her orthostasis with her primary physician since she says she is not currently being followed by cardiology.

## 2022-05-06 NOTE — Assessment & Plan Note (Signed)
Incruse inhaler seems to be comfortable for her and she denies recent significant cough or wheeze.  Exercise tolerance is stable except for orthostasis as noted. Plan-CXR, continue Incruse

## 2022-05-31 ENCOUNTER — Other Ambulatory Visit: Payer: Self-pay | Admitting: Physician Assistant

## 2022-06-01 NOTE — Telephone Encounter (Signed)
Next Visit: 09/09/2022  Last Visit: 03/06/2022  Last Fill: 01/19/2022  DX: Fibromyalgia   Current Dose per office note on 03/06/2022: methocarbamol 750 mg twice daily for muscle spasms.    Okay to refill methocarbamol?

## 2022-06-06 ENCOUNTER — Other Ambulatory Visit: Payer: Self-pay | Admitting: Internal Medicine

## 2022-06-08 NOTE — Telephone Encounter (Signed)
Incruse refilled

## 2022-06-29 ENCOUNTER — Encounter: Payer: Self-pay | Admitting: Physician Assistant

## 2022-06-29 ENCOUNTER — Ambulatory Visit (INDEPENDENT_AMBULATORY_CARE_PROVIDER_SITE_OTHER): Payer: Medicare Other | Admitting: Physician Assistant

## 2022-06-29 VITALS — BP 108/67 | HR 97 | Ht 60.0 in | Wt 76.0 lb

## 2022-06-29 DIAGNOSIS — R110 Nausea: Secondary | ICD-10-CM | POA: Diagnosis not present

## 2022-06-29 DIAGNOSIS — K4090 Unilateral inguinal hernia, without obstruction or gangrene, not specified as recurrent: Secondary | ICD-10-CM

## 2022-06-29 DIAGNOSIS — R109 Unspecified abdominal pain: Secondary | ICD-10-CM

## 2022-06-29 MED ORDER — METOCLOPRAMIDE HCL 5 MG PO TABS: 5.0000 mg | ORAL_TABLET | Freq: Three times a day (TID) | ORAL | 0 refills | Status: DC | PRN

## 2022-06-29 NOTE — Progress Notes (Signed)
Established patient acute visit   Patient: [name]   DOB: Jul 06, 1951   71 y.o. Female  MRN: [id number] Visit Date: 06/29/2022  Chief Complaint  Patient presents with   Nausea   Subjective    HPI  Patient presenting with c/o passing out a few months ago after onset of abdominal pain. No re-occurrence of syncope. Reports having trouble with her stomach for a while now. Cannot eat because her stomach gets upset and tender which has been ongoing for about 6 months. Also reports nausea. Sometimes has constipation and at other times has diarrhea depending on what she eats. Reports has not tried anything yet. No fever, chest pain, palpitations, vomiting, belching, bloating or bloody stools. States had gastroenteritis about 20 years ago. Reports certain foods she is able to tolerate are fruit cups, jello, and mashed potatoes.     Medications: Outpatient Medications Prior to Visit  Medication Sig   albuterol (VENTOLIN HFA) 108 (90 Base) MCG/ACT inhaler INHALE 1-2 PUFFS BY MOUTH EVERY 6 HOURS AS NEEDED FOR WHEEZE OR SHORTNESS OF BREATH   Calcium Carbonate-Vitamin D 600-400 MG-UNIT tablet Take 1 tablet by mouth 2 (two) times daily.   cetirizine (ZYRTEC) 10 MG tablet Take 10 mg by mouth daily.   citalopram (CELEXA) 40 MG tablet 1po qd Filled by Malachi Paradise-  Nurse Practitioner, [facility]   fluconazole (DIFLUCAN) 150 MG tablet Take 1 tablet (150 mg total) by mouth daily. (Patient not taking: Reported on 04/21/2022)   hydrOXYzine (ATARAX/VISTARIL) 10 MG tablet Take 10 mg by mouth 2 (two) times daily.   methocarbamol (ROBAXIN) 750 MG tablet TAKE 1 TABLET BY MOUTH 2 TIMES DAILY AS NEEDED FOR MUSCLE SPASMS (NON FORMULARY)   Probiotic Product (PROBIOTIC PO) Take 1 tablet by mouth daily.   Spacer/Aero-Holding Chambers (AEROCHAMBER MV) inhaler Use as instructed   umeclidinium bromide (INCRUSE ELLIPTA) 62.5 MCG/ACT AEPB TAKE 1 PUFF BY MOUTH EVERY DAY   umeclidinium bromide (INCRUSE  ELLIPTA) 62.5 MCG/INH AEPB Inhale 1 puff into the lungs daily.   Vitamin D, Ergocalciferol, (DRISDOL) 1.25 MG (50000 UNIT) CAPS capsule TAKE 1 CAPSULE BY MOUTH EVERY 7 DAYS   No facility-administered medications prior to visit.    Review of Systems Review of Systems:  A fourteen system review of systems was performed and found to be positive as per HPI.     Objective    BP 108/67   Pulse 97   Ht 5' (1.524 m)   Wt 76 lb (34.5 kg)   SpO2 92%   BMI 14.84 kg/m    Physical Exam  General:  Well Developed, well nourished, appropriate for stated age.  Neuro:  Alert and oriented,  extra-ocular muscles intact, no focal deficits  HEENT:  Normocephalic, atraumatic, neck supple  Skin:  no gross rash, warm, pink. Abdomen: tenderness of RUQ, no bloating, +BS, no guarding or rebound tenderness, +Murphy's sign  Cardiac:  RRR, S1 S2 Respiratory: CTA B/L  GU: reducible non-tender mass at left inguinal  Vascular:  Ext warm, no cyanosis apprec.; cap RF less 2 sec. Psych:  No HI/SI, judgement and insight good, Euthymic mood. Full Affect.   No results found for any visits on 06/29/22.  Assessment & Plan     Discussed with patient has s/sx suggestive of gallbladder or biliary tract disease. Patient previously established with Dr. Earlean Shawl, so will place referral. Will collect labs for further evaluation. Patient with a history of Qtc prolongation so will send rx for metoclopramide 5 mg every 8  hrs as needed for nausea. Recommend to avoid provocative triggers including fatty foods. One time syncope episode that occurred more than 2 months ago could have been vasovagal response to abdominal pain, if symptoms re-occur recommend to follow-up for further evaluation. Discussed monitoring inguinal hernia for now and it if changes then recommend referral to general surgery. Pt verbalized understanding.   Return if symptoms worsen or fail to improve.        Lorrene Reid, PA-C  [facility]  at [facility] [phone] (phone) [phone] (fax)  [place]

## 2022-06-29 NOTE — Patient Instructions (Signed)
Cholelithiasis  Cholelithiasis happens when gallstones form in the gallbladder. The gallbladder stores bile. Bile is a fluid that helps digest fats. Bile can harden and form into gallstones. If they cause a blockage, they can cause pain (gallbladder attack). What are the causes? This condition may be caused by: Some blood diseases, such as sickle cell anemia. Too much of a fat-like substance (cholesterol) in your bile. Not enough bile salts in your bile. These salts help the body absorb and digest fats. The gallbladder not emptying fully or often enough. This is common in pregnant women. What increases the risk? The following factors may make you more likely to develop this condition: Being female. Being pregnant many times. Eating a lot of fried foods, fat, and refined carbs (refined carbohydrates). Being very overweight (obese). Being older than age 40. Using medicines with female hormones in them for a long time. Losing weight fast. Having gallstones in your family. Having some health problems, such as diabetes, Crohn's disease, or liver disease. What are the signs or symptoms? Often, there may be gallstones but no symptoms. These gallstones are called silent gallstones. If a gallstone causes a blockage, you may get sudden pain. The pain: Can be in the upper right part of your belly (abdomen). Normally comes at night or after you eat. Can last an hour or more. Can spread to your right shoulder, back, or chest. Can feel like discomfort, burning, or fullness in the upper part of your belly (indigestion). If the blockage lasts more than a few hours, you can get an infection or swelling. You may: Feel like you may vomit. Vomit. Feel bloated. Have belly pain for 5 hours or more. Feel tender in your belly, often in the upper right part and under your ribs. Have fever or chills. Have skin or the white parts of your eyes turn yellow (jaundice). Have dark pee (urine) or pale poop  (stool). How is this treated? Treatment for this condition depends on how bad you feel. If you have symptoms, you may need: Home care, if symptoms are not very bad. Do not eat for 12-24 hours. Drink only water and clear liquids. Start to eat simple or clear foods after 1 or 2 days. Try broths and crackers. You may need medicines for pain or stomach upset or both. If you have an infection, you will need antibiotics. A hospital stay, if you have very bad pain or a very bad infection. Surgery to remove your gallbladder. You may need this if: Gallstones keep coming back. You have very bad symptoms. Medicines to break up gallstones. Medicines: Are best for small gallstones. May be used for up to 6-12 months. A procedure to find and take out gallstones or to break up gallstones. Follow these instructions at home: Medicines Take over-the-counter and prescription medicines only as told by your doctor. If you were prescribed an antibiotic medicine, take it as told by your doctor. Do not stop taking the antibiotic even if you start to feel better. Ask your doctor if the medicine prescribed to you requires you to avoid driving or using machinery. Eating and drinking Drink enough fluid to keep your urine pale yellow. Drink water or clear fluids. This is important when you have pain. Eat healthy foods. Choose: Fewer fatty foods, such as fried foods. Fewer refined carbs. Avoid breads and grains that are highly processed, such as white bread and white rice. Choose whole grains, such as whole-wheat bread and brown rice. More fiber. Almonds, fresh fruit, and   beans are healthy sources. General instructions Keep a healthy weight. Keep all follow-up visits as told by your doctor. This is important. Where to find more information [employer] of Diabetes and Digestive and Kidney Diseases: [email or web address] Contact a doctor if: You have sudden pain in the upper right part of your belly. Pain might  spread to your right shoulder, back, or chest. You have been diagnosed with gallstones that have no symptoms and you get: Belly pain. Discomfort, burning, or fullness in the upper part of your abdomen. You have dark urine or pale stools. Get help right away if: You have sudden pain in the upper right part of your abdomen, and the pain lasts more than 2 hours. You have pain in your abdomen, and: It lasts more than 5 hours. It keeps getting worse. You have a fever or chills. You keep feeling like you may vomit. You keep vomiting. Your skin or the white parts of your eyes turn yellow. Summary Cholelithiasis happens when gallstones form in the gallbladder. This condition may be caused by a blood disease, too much of a fat-like substance in the bile, or not enough bile salts in bile. Treatment for this condition depends on how bad you feel. If you have symptoms, do not eat or drink. You may need medicines. You may need a hospital stay for very bad pain or a very bad infection. You may need surgery if gallstones keep coming back or if you have very bad symptoms. This information is not intended to replace advice given to you by your health care provider. Make sure you discuss any questions you have with your health care provider. Document Revised: 10/06/2019 Document Reviewed: 07/10/2019 Elsevier Patient Education  [place].

## 2022-06-30 LAB — COMPREHENSIVE METABOLIC PANEL
ALT: 11 IU/L (ref 0–32)
AST: 21 IU/L (ref 0–40)
Albumin/Globulin Ratio: 1.5 (ref 1.2–2.2)
Albumin: 4.2 g/dL (ref 3.8–4.8)
Alkaline Phosphatase: 77 IU/L (ref 44–121)
BUN/Creatinine Ratio: 15 (ref 12–28)
BUN: 12 mg/dL (ref 8–27)
Bilirubin Total: 0.3 mg/dL (ref 0.0–1.2)
CO2: 28 mmol/L (ref 20–29)
Calcium: 9.5 mg/dL (ref 8.7–10.3)
Chloride: 98 mmol/L (ref 96–106)
Creatinine, Ser: 0.78 mg/dL (ref 0.57–1.00)
Globulin, Total: 2.8 g/dL (ref 1.5–4.5)
Glucose: 140 mg/dL — ABNORMAL HIGH (ref 70–99)
Potassium: 3.5 mmol/L (ref 3.5–5.2)
Sodium: 141 mmol/L (ref 134–144)
Total Protein: 7 g/dL (ref 6.0–8.5)
eGFR: 81 mL/min/{1.73_m2} (ref >59)

## 2022-06-30 LAB — CBC WITH DIFFERENTIAL/PLATELET
Basophils Absolute: 0.1 10*3/uL (ref 0.0–0.2)
Basos: 1 %
EOS (ABSOLUTE): 0.1 10*3/uL (ref 0.0–0.4)
Eos: 2 %
Hematocrit: 37.5 % (ref 34.0–46.6)
Hemoglobin: 12.2 g/dL (ref 11.1–15.9)
Immature Grans (Abs): 0 10*3/uL (ref 0.0–0.1)
Immature Granulocytes: 0 %
Lymphocytes Absolute: 1.3 10*3/uL (ref 0.7–3.1)
Lymphs: 21 %
MCH: 29.5 pg (ref 26.6–33.0)
MCHC: 32.5 g/dL (ref 31.5–35.7)
MCV: 91 fL (ref 79–97)
Monocytes Absolute: 0.7 10*3/uL (ref 0.1–0.9)
Monocytes: 12 %
Neutrophils Absolute: 3.9 10*3/uL (ref 1.4–7.0)
Neutrophils: 64 %
Platelets: 507 10*3/uL — ABNORMAL HIGH (ref 150–450)
RBC: 4.13 x10E6/uL (ref 3.77–5.28)
RDW: 12.4 % (ref 11.7–15.4)
WBC: 6.1 10*3/uL (ref 3.4–10.8)

## 2022-06-30 LAB — LIPASE: Lipase: 35 U/L (ref 14–85)

## 2022-07-29 ENCOUNTER — Ambulatory Visit: Payer: Medicare Other | Attending: Rheumatology | Admitting: Rheumatology

## 2022-07-29 ENCOUNTER — Encounter: Payer: Self-pay | Admitting: Rheumatology

## 2022-07-29 VITALS — BP 86/47 | HR 86 | Resp 15 | Ht 60.0 in | Wt 84.0 lb

## 2022-07-29 DIAGNOSIS — G8929 Other chronic pain: Secondary | ICD-10-CM

## 2022-07-29 DIAGNOSIS — Z8739 Personal history of other diseases of the musculoskeletal system and connective tissue: Secondary | ICD-10-CM | POA: Diagnosis not present

## 2022-07-29 DIAGNOSIS — M25562 Pain in left knee: Secondary | ICD-10-CM | POA: Diagnosis not present

## 2022-07-29 DIAGNOSIS — M19041 Primary osteoarthritis, right hand: Secondary | ICD-10-CM

## 2022-07-29 DIAGNOSIS — M7061 Trochanteric bursitis, right hip: Secondary | ICD-10-CM

## 2022-07-29 DIAGNOSIS — M51369 Other intervertebral disc degeneration, lumbar region without mention of lumbar back pain or lower extremity pain: Secondary | ICD-10-CM

## 2022-07-29 DIAGNOSIS — M5030 Other cervical disc degeneration, unspecified cervical region: Secondary | ICD-10-CM

## 2022-07-29 DIAGNOSIS — G4709 Other insomnia: Secondary | ICD-10-CM

## 2022-07-29 DIAGNOSIS — Z8639 Personal history of other endocrine, nutritional and metabolic disease: Secondary | ICD-10-CM

## 2022-07-29 DIAGNOSIS — M62838 Other muscle spasm: Secondary | ICD-10-CM | POA: Diagnosis not present

## 2022-07-29 DIAGNOSIS — M5136 Other intervertebral disc degeneration, lumbar region: Secondary | ICD-10-CM | POA: Diagnosis not present

## 2022-07-29 DIAGNOSIS — Z8669 Personal history of other diseases of the nervous system and sense organs: Secondary | ICD-10-CM

## 2022-07-29 DIAGNOSIS — M810 Age-related osteoporosis without current pathological fracture: Secondary | ICD-10-CM | POA: Diagnosis not present

## 2022-07-29 DIAGNOSIS — M7711 Lateral epicondylitis, right elbow: Secondary | ICD-10-CM | POA: Diagnosis not present

## 2022-07-29 DIAGNOSIS — M7062 Trochanteric bursitis, left hip: Secondary | ICD-10-CM

## 2022-07-29 DIAGNOSIS — M7712 Lateral epicondylitis, left elbow: Secondary | ICD-10-CM

## 2022-07-29 DIAGNOSIS — Z8659 Personal history of other mental and behavioral disorders: Secondary | ICD-10-CM

## 2022-07-29 DIAGNOSIS — M19042 Primary osteoarthritis, left hand: Secondary | ICD-10-CM

## 2022-07-29 DIAGNOSIS — Z853 Personal history of malignant neoplasm of breast: Secondary | ICD-10-CM

## 2022-07-29 DIAGNOSIS — Z8719 Personal history of other diseases of the digestive system: Secondary | ICD-10-CM

## 2022-07-29 DIAGNOSIS — Z8709 Personal history of other diseases of the respiratory system: Secondary | ICD-10-CM

## 2022-07-29 DIAGNOSIS — M797 Fibromyalgia: Secondary | ICD-10-CM | POA: Diagnosis not present

## 2022-07-29 MED ORDER — LIDOCAINE HCL 1 % IJ SOLN
0.5000 mL | INTRAMUSCULAR | Status: AC | PRN
  Administered 2022-07-29: .5 mL

## 2022-07-29 MED ORDER — TRIAMCINOLONE ACETONIDE 40 MG/ML IJ SUSP
10.0000 mg | INTRAMUSCULAR | Status: AC | PRN
  Administered 2022-07-29: 10 mg via INTRAMUSCULAR

## 2022-07-29 NOTE — Patient Instructions (Signed)
Cervical Strain and Sprain Rehab Ask your health care provider which exercises are safe for you. Do exercises exactly as told by your health care provider and adjust them as directed. It is normal to feel mild stretching, pulling, tightness, or discomfort as you do these exercises. Stop right away if you feel sudden pain or your pain gets worse. Do not begin these exercises until told by your health care provider. Stretching and range-of-motion exercises Cervical side bending  Using good posture, sit on a stable chair or stand up. Without moving your shoulders, slowly tilt your left / right ear to your shoulder until you feel a stretch in the neck muscles on the opposite side. You should be looking straight ahead. Hold for __________ seconds. Repeat with the other side of your neck. Repeat __________ times. Complete this exercise __________ times a day. Cervical rotation  Using good posture, sit on a stable chair or stand up. Slowly turn your head to the side as if you are looking over your left / right shoulder. Keep your eyes level with the ground. Stop when you feel a stretch along the side and the back of your neck. Hold for __________ seconds. Repeat this by turning to your other side. Repeat __________ times. Complete this exercise __________ times a day. Thoracic extension and pectoral stretch  Roll a towel or a small blanket so it is about 4 inches (10 cm) in diameter. Lie down on your back on a firm surface. Put the towel in the middle of your back across your spine. It should not be under your shoulder blades. Put your hands behind your head and let your elbows fall out to your sides. Hold for __________ seconds. Repeat __________ times. Complete this exercise __________ times a day. Strengthening exercises Upper cervical flexion  Lie on your back with a thin pillow behind your head or a small, rolled-up towel under your neck. Gently tuck your chin toward your chest and nod  your head down to look toward your feet. Do not lift your head off the pillow. Hold for __________ seconds. Release the tension slowly. Relax your neck muscles completely before you repeat this exercise. Repeat __________ times. Complete this exercise __________ times a day. Cervical extension  Stand about 6 inches (15 cm) away from a wall, with your back facing the wall. Place a soft object, about 6-8 inches (15-20 cm) in diameter, between the back of your head and the wall. A soft object could be a small pillow, a ball, or a folded towel. Gently tilt your head back and press into the soft object. Keep your jaw and forehead relaxed. Hold for __________ seconds. Release the tension slowly. Relax your neck muscles completely before you repeat this exercise. Repeat __________ times. Complete this exercise __________ times a day. Posture and body mechanics Body mechanics refer to the movements and positions of your body while you do your daily activities. Posture is part of body mechanics. Good posture and healthy body mechanics can help to relieve stress in your body's tissues and joints. Good posture means that your spine is in its natural S-curve position (your spine is neutral), your shoulders are pulled back slightly, and your head is not tipped forward. The following are general guidelines for using improved posture and body mechanics in your everyday activities. Sitting  When sitting, keep your spine neutral and keep your feet flat on the floor. Use a footrest, if needed, and keep your thighs parallel to the floor. Avoid rounding   your shoulders. Avoid tilting your head forward. When working at a desk or a computer, keep your desk at a height where your hands are slightly lower than your elbows. Slide your chair under your desk so you are close enough to maintain good posture. When working at a computer, place your monitor at a height where you are looking straight ahead and you do not have to  tilt your head forward or downward to look at the screen. Standing  When standing, keep your spine neutral and keep your feet about hip-width apart. Keep a slight bend in your knees. Your ears, shoulders, and hips should line up. When you do a task in which you stand in one place for a long time, place one foot up on a stable object that is 2-4 inches (5-10 cm) high, such as a footstool. This helps keep your spine neutral. Resting When lying down and resting, avoid positions that are most painful for you. Try to support your neck in a neutral position. You can use a contour pillow or a small rolled-up towel. Your pillow should support your neck but not push on it. This information is not intended to replace advice given to you by your health care provider. Make sure you discuss any questions you have with your health care provider. Document Revised: 03/09/2022 Document Reviewed: 03/09/2022 Elsevier Patient Education  2023 [employer]  

## 2022-07-29 NOTE — Progress Notes (Signed)
Office Visit Note  Patient: [name]             Date of Birth: 1951/06/07           MRN: [id number]             PCP: Lorrene Reid, PA-C Referring: Lorrene Reid, PA-C Visit Date: 07/29/2022 Occupation: '@GUAROCC'$ @  Subjective:  Follow-up (Bil trap injections)   History of Present Illness: [name] is a 71 y.o. female history of osteoarthritis, degenerative disc disease, fibromyalgia and osteoporosis.  She states the pain and discomfort in the trapezius region has come back.  She wants to had bilateral trigger point injections.  She has ongoing pain and discomfort in her lower back.  She has been taking Robaxin 5 750 mg twice a day as needed for the muscle spasms.  She continues to have pain and discomfort in her bilateral hands, left lateral epicondyle region, bilateral trochanteric area and her left knee joint.  She also has been having flare of fibromyalgia with generalized pain and discomfort.  She has not noticed any joint swelling.  She does not want to take any treatment for osteoporosis.  Activities of Daily Living:  Patient reports morning stiffness for 1 hour.   Patient Reports nocturnal pain.  Difficulty dressing/grooming: Denies Difficulty climbing stairs: Reports Difficulty getting out of chair: Denies Difficulty using hands for taps, buttons, cutlery, and/or writing: Denies  Review of Systems  Constitutional:  Positive for fatigue.  HENT:  Negative for mouth sores and mouth dryness.   Eyes:  Negative for dryness.  Respiratory:  Negative for difficulty breathing.   Cardiovascular:  Negative for chest pain and palpitations.  Gastrointestinal:  Negative for blood in stool, constipation and diarrhea.  Endocrine: Negative for increased urination.  Genitourinary:  Negative for involuntary urination.  Musculoskeletal:  Positive for joint pain, joint pain, myalgias, morning stiffness, muscle tenderness and myalgias. Negative for gait problem, joint  swelling and muscle weakness.  Skin:  Negative for color change, rash, hair loss and sensitivity to sunlight.  Allergic/Immunologic: Negative for susceptible to infections.  Neurological:  Positive for dizziness and headaches.  Hematological:  Negative for swollen glands.  Psychiatric/Behavioral:  Positive for depressed mood and sleep disturbance. The patient is nervous/anxious.     PMFS History:  Patient Active Problem List   Diagnosis Date Noted   Nasal congestion 09/06/2020   History of thrush 09/22/2019   Estrogen deficiency 06/28/2019   Post-menopausal 06/28/2019   Mood disorder ([place]) 03/17/2018   Osteopenia after menopause 11/06/2017   Medial epicondylitis of right elbow 10/14/2017   MDD (major depressive disorder), severe ([place]) 09/22/2017   MDD (major depressive disorder), recurrent episode, moderate (HCC)    Prolonged Q-T interval on ECG 09/20/2017   Overdose 09/20/2017   Acute cystitis with hematuria    Suicide attempt [facility])    Family history of diabetes mellitus- mom in 50's August 30, 2017    Class: Chronic   Family history of colon cancer- sister died 45yo 08/30/2017   Adjustment disorder with mixed anxiety < depressed mood 08/30/17   B12 deficiency Aug 30, 2017   Fibromyalgia syndrome 11/23/2016   Other insomnia 11/23/2016   Trapezius muscle spasm 11/23/2016   DDD (degenerative disc disease), cervical 11/23/2016   DDD (degenerative disc disease), lumbar 11/23/2016   Other idiopathic scoliosis, lumbar region 11/23/2016   Malignant neoplasm of female breast ([place]) 11/23/2016   History of gastroesophageal reflux (GERD) 11/23/2016   PCP NOTES >>>>>>>>>>>>>>>>>>>> 11/21/2015   Constipation 02/15/2015  Annual physical exam 11/20/2013   COPD mixed type ([place]) 11/20/2013   Unspecified adverse effect of unspecified drug, medicinal and biological substance 12/10/2011   Vitamin D deficiency 07/06/2011   CARDIOMYOPATHY, PRIMARY, DILATED 01/01/2009   BREAST CANCER, HX OF  11/02/2008   h/o HYPERLIPIDEMIA 10/18/2007   GERD 10/18/2007   ANEMIA, B12 DEFICIENCY 10/12/2007   Anxiety,depression, insomnia 10/12/2007   IBS 10/12/2007   Primary osteoarthritis of both hands 10/12/2007   Osteoporosis 10/12/2007   COLONIC POLYPS, HX OF 10/12/2007   MIGRAINES, HX OF 10/12/2007   Fibromyalgia--Dr. Herold Harms, on Ultram 07/14/2007    Past Medical History:  Diagnosis Date   Anemia    B12 deficient   B12 deficiency    Breast cancer ([place])    left breast.   Chronic SI joint pain    Bilateral, Dr. Estanislado Pandy   Fibromyalgia    Dr. Estanislado Pandy   GERD (gastroesophageal reflux disease)    Greater trochanteric bursitis of both hips    Dr. Estanislado Pandy   Hyperlipidemia    IBS (irritable bowel syndrome)    LV dysfunction    iatrogenic from chemotherapy ; on Carvedilol    Migraines    Osteoporosis    Dr Lomax---> transferring to a new gyn    Family History  Problem Relation Age of Onset   Diabetes Mother    Breast cancer Mother    Depression Mother        anxiety   Heart disease Mother        in her 48s   Colon cancer Sister    Healthy Daughter    Stroke Neg Hx    Past Surgical History:  Procedure Laterality Date   ABDOMINAL HYSTERECTOMY     BSO for  Endometriosis   CARPAL TUNNEL RELEASE     left   CERVICAL DISC SURGERY     COLONOSCOPY W/ POLYPECTOMY  1999   Dr Earlean Shawl   ESOPHAGEAL DILATION  2005   Dr Earlean Shawl   MASTECTOMY  2009   bilateral, Dr Boyce Medici     Social History   Social History Narrative   Lives w/ husband   Immunization History  Administered Date(s) Administered   Influenza Whole 07/14/2007, 05/31/2012   Influenza, High Dose Seasonal PF 07/18/2018   PFIZER(Purple Top)SARS-COV-2 Vaccination 10/30/2019, 11/26/2019   Td 04/01/1998   Tdap 10/29/2011     Objective: Vital Signs: BP (!) 86/47 (BP Location: Left Arm, Patient Position: Sitting, Cuff Size: Normal)   Pulse 86   Resp 15   Ht 5' (1.524 m)   Wt 84 lb (38.1 kg)    BMI 16.41 kg/m    Physical Exam Vitals and nursing note reviewed.  Constitutional:      Appearance: She is well-developed.  HENT:     Head: Normocephalic and atraumatic.  Eyes:     Conjunctiva/sclera: Conjunctivae normal.  Cardiovascular:     Rate and Rhythm: Normal rate and regular rhythm.     Heart sounds: Normal heart sounds.  Pulmonary:     Effort: Pulmonary effort is normal.     Breath sounds: Normal breath sounds.  Abdominal:     General: Bowel sounds are normal.     Palpations: Abdomen is soft.  Musculoskeletal:     Cervical back: Normal range of motion.  Lymphadenopathy:     Cervical: No cervical adenopathy.  Skin:    General: Skin is warm and dry.     Capillary Refill: Capillary refill takes less than 2 seconds.  Neurological:     Mental Status: She is alert and oriented to person, place, and time.  Psychiatric:        Behavior: Behavior normal.      Musculoskeletal Exam: She had limited range of motion of the cervical spine with discomfort.  She had bilateral trapezius spasm.  She had not limited painful range of motion of the lumbar spine.  Shoulder joints, elbow joints, wrist joints with good range of motion.  She had tenderness over left lateral epicondyle region.  Hip joints and knee joints with good range of motion.  She had tenderness over bilateral trochanteric bursa.  Knee joints with good range of motion without any warmth swelling or effusion.  There was no tenderness over ankles or MTPs.  CDAI Exam: CDAI Score: -- Patient Global: --; Provider Global: -- Swollen: --; Tender: -- Joint Exam 07/29/2022   No joint exam has been documented for this visit   There is currently no information documented on the homunculus. Go to the Rheumatology activity and complete the homunculus joint exam.  Investigation: No additional findings.  Imaging: No results found.  Recent Labs: Lab Results  Component Value Date   WBC 6.1 06/29/2022   HGB 12.2 06/29/2022    PLT 507 (H) 06/29/2022   NA 141 06/29/2022   K 3.5 06/29/2022   CL 98 06/29/2022   CO2 28 06/29/2022   GLUCOSE 140 (H) 06/29/2022   BUN 12 06/29/2022   CREATININE 0.78 06/29/2022   BILITOT 0.3 06/29/2022   ALKPHOS 77 06/29/2022   AST 21 06/29/2022   ALT 11 06/29/2022   PROT 7.0 06/29/2022   ALBUMIN 4.2 06/29/2022   CALCIUM 9.5 06/29/2022   GFRAA 80 06/28/2019    Speciality Comments: Narcotic Agreement Broken- [place] found on last UDS.   Procedures:  Trigger Point Inj  Date/Time: 07/29/2022 2:53 PM  Performed by: Bo Merino, MD Authorized by: Bo Merino, MD   Consent Given by:  Patient Site marked: the procedure site was marked   Timeout: prior to procedure the correct patient, procedure, and site was verified   Indications:  Muscle spasm and pain Total # of Trigger Points:  2 Location: neck   Needle Size:  27 G Approach:  Dorsal Medications #1:  0.5 mL lidocaine 1 %; 10 mg triamcinolone acetonide 40 MG/ML Medications #2:  0.5 mL lidocaine 1 %; 10 mg triamcinolone acetonide 40 MG/ML Patient tolerance:  Patient tolerated the procedure well with no immediate complications  Allergies: Amoxicillin-pot clavulanate, Doxycycline, and Venlafaxine   Assessment / Plan:     Visit Diagnoses: Trapezius muscle spasm-she came today to get bilateral trapezius muscle injection.  She states she had good response to trigger point injections in the past.  She has been having increased pain and discomfort and having difficulty sleeping at night due to pain.  After informed consent was obtained bilateral trapezius region were injected with lidocaine and Kenalog as described above.  She tolerated the procedure well.  Postprocedure instructions were given.  A handout on cervical spine exercises was given.  DDD (degenerative disc disease), cervical-she has disc disease of cervical spine.  She has chronic pain and limited range of motion.  DDD (degenerative disc disease),  lumbar-she complains of discomfort in her lower back.  She had no point tenderness on the examination.  History of scoliosis  Primary osteoarthritis of both hands-she continues to have some discomfort in her hands.  Mild PIP and DIP thickening consistent with osteoarthritis was discussed.  Joint  protection muscle strengthening was discussed.  Lateral epicondylitis of both elbowsshe has off-and-on discomfort in the epicondyle region.  She has tenderness over the left lateral epicondyle area.  Forearm exercises were demonstrated.  Trochanteric bursitis of both hips-she continues to have pain and discomfort in bilateral trochanteric region.  IT band stretches were demonstrated.  Chronic pain of left knee-she has intermittent discomfort in her left knee joint.  No warmth swelling or effusion was noted.  Other insomnia-good sleep hygiene was discussed.  Fibromyalgia-she continues to have generalized pain and discomfort from fibromyalgia.  Age-related osteoporosis without current pathological fracture-DEXA on 01/01/2020: The BMD measured at Femur Neck Right is 0.609 g/cm2 with a T-score of -3.1.  I advised repeat DEXA scan which she declined.  She does not want to take any treatment for osteoporosis at this point.  She has been taking vitamin D.  History of vitamin D deficiency-she is on vitamin D 50,000 units a week.  I do not have any recent values of her vitamin D level.  Other medical problems are listed as follows:  History of hyperlipidemia  History of gastroesophageal reflux (GERD)  History of IBS  History of COPD  History of migraine  History of anxiety  History of depression  History of breast cancer  Orders: No orders of the defined types were placed in this encounter.  No orders of the defined types were placed in this encounter.   Follow-Up Instructions: Return in about 6 months (around 01/27/2023) for Osteoarthritis, FMS.   Bo Merino, MD  Note - This record  has been created using Editor, commissioning.  Chart creation errors have been sought, but may not always  have been located. Such creation errors do not reflect on  the standard of medical care.

## 2022-08-06 ENCOUNTER — Other Ambulatory Visit: Payer: Self-pay | Admitting: Physician Assistant

## 2022-08-06 NOTE — Telephone Encounter (Signed)
Next Visit: 10/29/2022  Last Visit: 07/29/2022  Last Fill: 06/01/2022  DX: Trapezius muscle spasm   Current Dose per office note on 07/29/2022: Robaxin 5 750 mg twice a day as needed for the muscle spasms.   Okay to refill robaxin?

## 2022-08-12 ENCOUNTER — Other Ambulatory Visit: Payer: Self-pay | Admitting: Internal Medicine

## 2022-08-12 DIAGNOSIS — J449 Chronic obstructive pulmonary disease, unspecified: Secondary | ICD-10-CM

## 2022-09-09 ENCOUNTER — Ambulatory Visit: Payer: Medicare Other | Admitting: Rheumatology

## 2022-10-15 NOTE — Progress Notes (Deleted)
Office Visit Note  Patient: [name]             Date of Birth: 01/03/1951           MRN: [id number]             PCP: Lorrene Reid, PA-C Referring: Lorrene Reid, PA-C Visit Date: 10/29/2022 Occupation: @GUAROCC$ @  Subjective:  No chief complaint on file.   History of Present Illness: [name] is a 72 y.o. female ***     Activities of Daily Living:  Patient reports morning stiffness for *** {minute/hour:19697}.   Patient {ACTIONS;DENIES/REPORTS:21021675::"Denies"} nocturnal pain.  Difficulty dressing/grooming: {ACTIONS;DENIES/REPORTS:21021675::"Denies"} Difficulty climbing stairs: {ACTIONS;DENIES/REPORTS:21021675::"Denies"} Difficulty getting out of chair: {ACTIONS;DENIES/REPORTS:21021675::"Denies"} Difficulty using hands for taps, buttons, cutlery, and/or writing: {ACTIONS;DENIES/REPORTS:21021675::"Denies"}  No Rheumatology ROS completed.   PMFS History:  Patient Active Problem List   Diagnosis Date Noted   Nasal congestion 09/06/2020   History of thrush 09/22/2019   Estrogen deficiency 06/28/2019   Post-menopausal 06/28/2019   Mood disorder ([place]) 03/17/2018   Osteopenia after menopause 11/06/2017   Medial epicondylitis of right elbow 10/14/2017   MDD (major depressive disorder), severe ([place]) 09/22/2017   MDD (major depressive disorder), recurrent episode, moderate (HCC)    Prolonged Q-T interval on ECG 09/20/2017   Overdose 09/20/2017   Acute cystitis with hematuria    Su[facility] attempt [facility])    Family history of diabetes mellitus- mom in 50's August 30, 2017    Class: Chronic   Family history of colon cancer- sister died 73yo 08-30-17   Adjustment disorder with mixed anxiety < depressed mood 08/30/2017   B12 deficiency 08/30/2017   Fibromyalgia syndrome 11/23/2016   Other insomnia 11/23/2016   Trapezius muscle spasm 11/23/2016   DDD (degenerative disc disease), cervical 11/23/2016   DDD (degenerative disc disease), lumbar 11/23/2016   Other  idiopathic scoliosis, lumbar region 11/23/2016   Malignant neoplasm of female breast ([place]) 11/23/2016   History of gastroesophageal reflux (GERD) 11/23/2016   PCP NOTES >>>>>>>>>>>>>>>>>>>> 11/21/2015   Constipation 02/15/2015   Annual physical exam 11/20/2013   COPD mixed type ([place]) 11/20/2013   Unspecified adverse effect of unspecified drug, medicinal and biological substance 12/10/2011   Vitamin D deficiency 07/06/2011   CARDIOMYOPATHY, PRIMARY, DILATED 01/01/2009   BREAST CANCER, HX OF 11/02/2008   h/o HYPERLIPIDEMIA 10/18/2007   GERD 10/18/2007   ANEMIA, B12 DEFICIENCY 10/12/2007   Anxiety,depression, insomnia 10/12/2007   IBS 10/12/2007   Primary osteoarthritis of both hands 10/12/2007   Osteoporosis 10/12/2007   COLONIC POLYPS, HX OF 10/12/2007   MIGRAINES, HX OF 10/12/2007   Fibromyalgia--Dr. Herold Harms, on Ultram 07/14/2007    Past Medical History:  Diagnosis Date   Anemia    B12 deficient   B12 deficiency    Breast cancer ([place])    left breast.   Chronic SI joint pain    Bilateral, Dr. Estanislado Pandy   Fibromyalgia    Dr. Estanislado Pandy   GERD (gastroesophageal reflux disease)    Greater trochanteric bursitis of both hips    Dr. Estanislado Pandy   Hyperlipidemia    IBS (irritable bowel syndrome)    LV dysfunction    iatrogenic from chemotherapy ; on Carvedilol    Migraines    Osteoporosis    Dr Lomax---> transferring to a new gyn    Family History  Problem Relation Age of Onset   Diabetes Mother    Breast cancer Mother    Depression Mother        anxiety   Heart disease Mother  in her 65s   Colon cancer Sister    Healthy Daughter    Stroke Neg Hx    Past Surgical History:  Procedure Laterality Date   ABDOMINAL HYSTERECTOMY     BSO for  Endometriosis   CARPAL TUNNEL RELEASE     left   CERVICAL DISC SURGERY     COLONOSCOPY W/ POLYPECTOMY  1999   Dr Earlean Shawl   ESOPHAGEAL DILATION  2005   Dr Earlean Shawl   MASTECTOMY  2009   bilateral, Dr Boyce Medici      Social History   Social History Narrative   Lives w/ husband   Immunization History  Administered Date(s) Administered   Influenza Whole 07/14/2007, 05/31/2012   Influenza, High Dose Seasonal PF 07/18/2018   PFIZER(Purple Top)SARS-COV-2 Vaccination 10/30/2019, 11/26/2019   Td 04/01/1998   Tdap 10/29/2011     Objective: Vital Signs: There were no vitals taken for this visit.   Physical Exam   Musculoskeletal Exam: ***  CDAI Exam: CDAI Score: -- Patient Global: --; Provider Global: -- Swollen: --; Tender: -- Joint Exam 10/29/2022   No joint exam has been documented for this visit   There is currently no information documented on the homunculus. Go to the Rheumatology activity and complete the homunculus joint exam.  Investigation: No additional findings.  Imaging: No results found.  Recent Labs: Lab Results  Component Value Date   WBC 6.1 06/29/2022   HGB 12.2 06/29/2022   PLT 507 (H) 06/29/2022   NA 141 06/29/2022   K 3.5 06/29/2022   CL 98 06/29/2022   CO2 28 06/29/2022   GLUCOSE 140 (H) 06/29/2022   BUN 12 06/29/2022   CREATININE 0.78 06/29/2022   BILITOT 0.3 06/29/2022   ALKPHOS 77 06/29/2022   AST 21 06/29/2022   ALT 11 06/29/2022   PROT 7.0 06/29/2022   ALBUMIN 4.2 06/29/2022   CALCIUM 9.5 06/29/2022   GFRAA 80 06/28/2019    Speciality Comments: Narcotic Agreement Broken- [place] found on last UDS.   Procedures:  No procedures performed Allergies: Amoxicillin-pot clavulanate, Doxycycline, and Venlafaxine   Assessment / Plan:     Visit Diagnoses: Fibromyalgia  Trapezius muscle spasm  DDD (degenerative disc disease), cervical  DDD (degenerative disc disease), lumbar  History of scoliosis  Primary osteoarthritis of both hands  Lateral epicondylitis of both elbows  Trochanteric bursitis of both hips  Chronic pain of left knee  Other insomnia  Age-related osteoporosis without current pathological fracture  History of  vitamin D deficiency  History of hyperlipidemia  History of gastroesophageal reflux (GERD)  History of IBS  History of COPD  History of migraine  History of anxiety  History of depression  History of breast cancer  Orders: No orders of the defined types were placed in this encounter.  No orders of the defined types were placed in this encounter.   Face-to-face time spent with patient was *** minutes. Greater than 50% of time was spent in counseling and coordination of care.  Follow-Up Instructions: No follow-ups on file.   Ofilia Neas, PA-C  Note - This record has been created using Dragon software.  Chart creation errors have been sought, but may not always  have been located. Such creation errors do not reflect on  the standard of medical care.

## 2022-10-29 ENCOUNTER — Ambulatory Visit: Payer: Medicare Other | Admitting: Physician Assistant

## 2022-10-29 DIAGNOSIS — Z8719 Personal history of other diseases of the digestive system: Secondary | ICD-10-CM

## 2022-10-29 DIAGNOSIS — Z853 Personal history of malignant neoplasm of breast: Secondary | ICD-10-CM

## 2022-10-29 DIAGNOSIS — Z8709 Personal history of other diseases of the respiratory system: Secondary | ICD-10-CM

## 2022-10-29 DIAGNOSIS — Z8669 Personal history of other diseases of the nervous system and sense organs: Secondary | ICD-10-CM

## 2022-10-29 DIAGNOSIS — G8929 Other chronic pain: Secondary | ICD-10-CM

## 2022-10-29 DIAGNOSIS — M19041 Primary osteoarthritis, right hand: Secondary | ICD-10-CM

## 2022-10-29 DIAGNOSIS — M5030 Other cervical disc degeneration, unspecified cervical region: Secondary | ICD-10-CM

## 2022-10-29 DIAGNOSIS — M5136 Other intervertebral disc degeneration, lumbar region: Secondary | ICD-10-CM

## 2022-10-29 DIAGNOSIS — M810 Age-related osteoporosis without current pathological fracture: Secondary | ICD-10-CM

## 2022-10-29 DIAGNOSIS — G4709 Other insomnia: Secondary | ICD-10-CM

## 2022-10-29 DIAGNOSIS — Z8639 Personal history of other endocrine, nutritional and metabolic disease: Secondary | ICD-10-CM

## 2022-10-29 DIAGNOSIS — M7711 Lateral epicondylitis, right elbow: Secondary | ICD-10-CM

## 2022-10-29 DIAGNOSIS — M797 Fibromyalgia: Secondary | ICD-10-CM

## 2022-10-29 DIAGNOSIS — M7061 Trochanteric bursitis, right hip: Secondary | ICD-10-CM

## 2022-10-29 DIAGNOSIS — M62838 Other muscle spasm: Secondary | ICD-10-CM

## 2022-10-29 DIAGNOSIS — Z8659 Personal history of other mental and behavioral disorders: Secondary | ICD-10-CM

## 2022-10-29 DIAGNOSIS — Z8739 Personal history of other diseases of the musculoskeletal system and connective tissue: Secondary | ICD-10-CM

## 2022-11-12 ENCOUNTER — Other Ambulatory Visit: Payer: Self-pay | Admitting: Internal Medicine

## 2022-11-12 DIAGNOSIS — J449 Chronic obstructive pulmonary disease, unspecified: Secondary | ICD-10-CM

## 2022-11-21 ENCOUNTER — Other Ambulatory Visit: Payer: Self-pay | Admitting: Physician Assistant

## 2022-11-24 NOTE — Telephone Encounter (Signed)
Last Fill: 08/06/2022  Next Visit: Due February 2024. Message sent to the front to schedule.    Last Visit: 07/29/2022   DX: Trapezius muscle spasm    Current Dose per office note on 07/29/2022: Robaxin 5 750 mg twice a day as needed for the muscle spasms.    Okay to refill robaxin?

## 2022-11-24 NOTE — Telephone Encounter (Signed)
Please schedule patient a follow up visit. Patient was due February 2024. Thanks!

## 2022-11-24 NOTE — Telephone Encounter (Signed)
LMOM for patient to call and schedule follow-up appointment.   °

## 2022-12-17 ENCOUNTER — Telehealth: Payer: Self-pay

## 2022-12-17 NOTE — Telephone Encounter (Signed)
Called patient to schedule Medicare Annual Wellness Visit (AWV). Unable to reach patient.  Last date of AWV: 06/28/19  Please schedule an appointment at any time on Annual Wellness Visit Schedule.

## 2022-12-22 ENCOUNTER — Other Ambulatory Visit: Payer: Self-pay

## 2022-12-22 ENCOUNTER — Inpatient Hospital Stay (HOSPITAL_COMMUNITY)
Admission: EM | Admit: 2022-12-22 | Discharge: 2022-12-25 | DRG: 202 | Disposition: A | Discharge status: Home / Self Care | Payer: Medicare Other | Source: Ambulatory Visit | Attending: Internal Medicine | Admitting: Internal Medicine

## 2022-12-22 ENCOUNTER — Ambulatory Visit (INDEPENDENT_AMBULATORY_CARE_PROVIDER_SITE_OTHER): Payer: Medicare Other | Admitting: Family Medicine

## 2022-12-22 ENCOUNTER — Encounter: Payer: Self-pay | Admitting: Family Medicine

## 2022-12-22 ENCOUNTER — Emergency Department (HOSPITAL_COMMUNITY): Payer: Medicare Other

## 2022-12-22 VITALS — BP 114/60 | HR 122 | Temp 97.7°F | Ht 60.0 in | Wt 80.0 lb

## 2022-12-22 DIAGNOSIS — J449 Chronic obstructive pulmonary disease, unspecified: Secondary | ICD-10-CM | POA: Diagnosis present

## 2022-12-22 DIAGNOSIS — F411 Generalized anxiety disorder: Secondary | ICD-10-CM | POA: Diagnosis present

## 2022-12-22 DIAGNOSIS — Z888 Allergy status to other drugs, medicaments and biological substances status: Secondary | ICD-10-CM

## 2022-12-22 DIAGNOSIS — Z1152 Encounter for screening for COVID-19: Secondary | ICD-10-CM

## 2022-12-22 DIAGNOSIS — J205 Acute bronchitis due to respiratory syncytial virus: Principal | ICD-10-CM | POA: Diagnosis present

## 2022-12-22 DIAGNOSIS — Z833 Family history of diabetes mellitus: Secondary | ICD-10-CM

## 2022-12-22 DIAGNOSIS — E785 Hyperlipidemia, unspecified: Secondary | ICD-10-CM | POA: Diagnosis not present

## 2022-12-22 DIAGNOSIS — F32A Depression, unspecified: Secondary | ICD-10-CM | POA: Diagnosis present

## 2022-12-22 DIAGNOSIS — G43909 Migraine, unspecified, not intractable, without status migrainosus: Secondary | ICD-10-CM | POA: Diagnosis not present

## 2022-12-22 DIAGNOSIS — F4381 Prolonged grief disorder: Secondary | ICD-10-CM | POA: Diagnosis present

## 2022-12-22 DIAGNOSIS — I4891 Unspecified atrial fibrillation: Secondary | ICD-10-CM

## 2022-12-22 DIAGNOSIS — M810 Age-related osteoporosis without current pathological fracture: Secondary | ICD-10-CM | POA: Diagnosis present

## 2022-12-22 DIAGNOSIS — M797 Fibromyalgia: Secondary | ICD-10-CM | POA: Diagnosis present

## 2022-12-22 DIAGNOSIS — C50919 Malignant neoplasm of unspecified site of unspecified female breast: Secondary | ICD-10-CM

## 2022-12-22 DIAGNOSIS — Z9013 Acquired absence of bilateral breasts and nipples: Secondary | ICD-10-CM | POA: Diagnosis not present

## 2022-12-22 DIAGNOSIS — E43 Unspecified severe protein-calorie malnutrition: Secondary | ICD-10-CM | POA: Diagnosis not present

## 2022-12-22 DIAGNOSIS — Z803 Family history of malignant neoplasm of breast: Secondary | ICD-10-CM

## 2022-12-22 DIAGNOSIS — Z87891 Personal history of nicotine dependence: Secondary | ICD-10-CM

## 2022-12-22 DIAGNOSIS — J209 Acute bronchitis, unspecified: Secondary | ICD-10-CM | POA: Diagnosis present

## 2022-12-22 DIAGNOSIS — E876 Hypokalemia: Secondary | ICD-10-CM | POA: Diagnosis present

## 2022-12-22 DIAGNOSIS — R059 Cough, unspecified: Secondary | ICD-10-CM | POA: Diagnosis not present

## 2022-12-22 DIAGNOSIS — R634 Abnormal weight loss: Secondary | ICD-10-CM | POA: Diagnosis present

## 2022-12-22 DIAGNOSIS — Z681 Body mass index (BMI) 19 or less, adult: Secondary | ICD-10-CM | POA: Diagnosis not present

## 2022-12-22 DIAGNOSIS — R54 Age-related physical debility: Secondary | ICD-10-CM | POA: Diagnosis present

## 2022-12-22 DIAGNOSIS — Z79899 Other long term (current) drug therapy: Secondary | ICD-10-CM | POA: Diagnosis not present

## 2022-12-22 DIAGNOSIS — J208 Acute bronchitis due to other specified organisms: Secondary | ICD-10-CM | POA: Diagnosis not present

## 2022-12-22 DIAGNOSIS — F419 Anxiety disorder, unspecified: Secondary | ICD-10-CM | POA: Diagnosis present

## 2022-12-22 DIAGNOSIS — I4719 Other supraventricular tachycardia: Secondary | ICD-10-CM | POA: Diagnosis not present

## 2022-12-22 DIAGNOSIS — R002 Palpitations: Secondary | ICD-10-CM | POA: Diagnosis present

## 2022-12-22 DIAGNOSIS — J441 Chronic obstructive pulmonary disease with (acute) exacerbation: Secondary | ICD-10-CM | POA: Diagnosis present

## 2022-12-22 DIAGNOSIS — Z8679 Personal history of other diseases of the circulatory system: Secondary | ICD-10-CM

## 2022-12-22 DIAGNOSIS — Z881 Allergy status to other antibiotic agents status: Secondary | ICD-10-CM

## 2022-12-22 DIAGNOSIS — K219 Gastro-esophageal reflux disease without esophagitis: Secondary | ICD-10-CM | POA: Diagnosis present

## 2022-12-22 DIAGNOSIS — R000 Tachycardia, unspecified: Secondary | ICD-10-CM | POA: Diagnosis not present

## 2022-12-22 DIAGNOSIS — I428 Other cardiomyopathies: Secondary | ICD-10-CM | POA: Diagnosis not present

## 2022-12-22 DIAGNOSIS — J440 Chronic obstructive pulmonary disease with acute lower respiratory infection: Secondary | ICD-10-CM | POA: Diagnosis present

## 2022-12-22 DIAGNOSIS — Z853 Personal history of malignant neoplasm of breast: Secondary | ICD-10-CM

## 2022-12-22 DIAGNOSIS — R051 Acute cough: Secondary | ICD-10-CM | POA: Diagnosis not present

## 2022-12-22 DIAGNOSIS — Z8249 Family history of ischemic heart disease and other diseases of the circulatory system: Secondary | ICD-10-CM

## 2022-12-22 DIAGNOSIS — Z800 Family history of malignant neoplasm of digestive organs: Secondary | ICD-10-CM

## 2022-12-22 DIAGNOSIS — Z818 Family history of other mental and behavioral disorders: Secondary | ICD-10-CM

## 2022-12-22 DIAGNOSIS — J40 Bronchitis, not specified as acute or chronic: Secondary | ICD-10-CM | POA: Diagnosis not present

## 2022-12-22 DIAGNOSIS — G4700 Insomnia, unspecified: Secondary | ICD-10-CM | POA: Diagnosis not present

## 2022-12-22 DIAGNOSIS — J9601 Acute respiratory failure with hypoxia: Secondary | ICD-10-CM | POA: Diagnosis not present

## 2022-12-22 DIAGNOSIS — E538 Deficiency of other specified B group vitamins: Secondary | ICD-10-CM | POA: Diagnosis present

## 2022-12-22 LAB — BASIC METABOLIC PANEL
Anion gap: 16 — ABNORMAL HIGH (ref 5–15)
BUN: 21 mg/dL (ref 8–23)
CO2: 21 mmol/L — ABNORMAL LOW (ref 22–32)
Calcium: 9.4 mg/dL (ref 8.9–10.3)
Chloride: 97 mmol/L — ABNORMAL LOW (ref 98–111)
Creatinine, Ser: 0.94 mg/dL (ref 0.44–1.00)
GFR, Estimated: >60 mL/min (ref >60)
Glucose, Bld: 144 mg/dL — ABNORMAL HIGH (ref 70–99)
Potassium: 2.8 mmol/L — ABNORMAL LOW (ref 3.5–5.1)
Sodium: 134 mmol/L — ABNORMAL LOW (ref 135–145)

## 2022-12-22 LAB — RESPIRATORY PANEL BY PCR

## 2022-12-22 LAB — CBC
HCT: 43.5 % (ref 36.0–46.0)
Hemoglobin: 14.5 g/dL (ref 12.0–15.0)
MCH: 30.5 pg (ref 26.0–34.0)
MCHC: 33.3 g/dL (ref 30.0–36.0)
MCV: 91.4 fL (ref 80.0–100.0)
Platelets: 265 10*3/uL (ref 150–400)
RBC: 4.76 MIL/uL (ref 3.87–5.11)
RDW: 12.9 % (ref 11.5–15.5)
WBC: 11.6 10*3/uL — ABNORMAL HIGH (ref 4.0–10.5)
nRBC: 0 % (ref 0.0–0.2)

## 2022-12-22 LAB — TROPONIN I (HIGH SENSITIVITY)
Troponin I (High Sensitivity): 6 ng/L (ref <18)
Troponin I (High Sensitivity): 5 ng/L (ref <18)

## 2022-12-22 LAB — LACTIC ACID, PLASMA: Lactic Acid, Venous: 2.2 mmol/L (ref 0.5–1.9)

## 2022-12-22 LAB — PROCALCITONIN: Procalcitonin: <0.1 ng/mL

## 2022-12-22 LAB — BRAIN NATRIURETIC PEPTIDE: B Natriuretic Peptide: 49 pg/mL (ref 0.0–100.0)

## 2022-12-22 LAB — SARS CORONAVIRUS 2 BY RT PCR: SARS Coronavirus 2 by RT PCR: NEGATIVE

## 2022-12-22 LAB — TSH: TSH: 3.375 u[IU]/mL (ref 0.350–4.500)

## 2022-12-22 MED ORDER — ALBUTEROL SULFATE (2.5 MG/3ML) 0.083% IN NEBU
2.5000 mg | INHALATION_SOLUTION | Freq: Four times a day (QID) | RESPIRATORY_TRACT | Status: DC
  Administered 2022-12-22 – 2022-12-23 (×2): 2.5 mg via RESPIRATORY_TRACT
  Filled 2022-12-22 (×2): qty 3

## 2022-12-22 MED ORDER — METHOCARBAMOL 500 MG PO TABS: 750.0000 mg | ORAL_TABLET | Freq: Four times a day (QID) | ORAL | Status: DC | PRN

## 2022-12-22 MED ORDER — ASPIRIN 81 MG PO CHEW
324.0000 mg | CHEWABLE_TABLET | Freq: Once | ORAL | Status: AC
  Administered 2022-12-22: 324 mg via ORAL
  Filled 2022-12-22: qty 4

## 2022-12-22 MED ORDER — SODIUM CHLORIDE 0.9% FLUSH
3.0000 mL | Freq: Two times a day (BID) | INTRAVENOUS | Status: DC
  Administered 2022-12-22 – 2022-12-25 (×7): 3 mL via INTRAVENOUS

## 2022-12-22 MED ORDER — HYDROXYZINE HCL 10 MG PO TABS: 10.0000 mg | ORAL_TABLET | Freq: Four times a day (QID) | ORAL | Status: DC | PRN

## 2022-12-22 MED ORDER — POTASSIUM CHLORIDE CRYS ER 20 MEQ PO TBCR
40.0000 meq | EXTENDED_RELEASE_TABLET | Freq: Once | ORAL | Status: AC
  Administered 2022-12-22: 40 meq via ORAL
  Filled 2022-12-22: qty 2

## 2022-12-22 MED ORDER — DILTIAZEM HCL-DEXTROSE 125-5 MG/125ML-% IV SOLN (PREMIX)
5.0000 mg/h | INTRAVENOUS | Status: DC
  Administered 2022-12-22: 5 mg/h via INTRAVENOUS
  Administered 2022-12-23: 7.5 mg/h via INTRAVENOUS
  Filled 2022-12-22 (×2): qty 125

## 2022-12-22 MED ORDER — DILTIAZEM LOAD VIA INFUSION
10.0000 mg | Freq: Once | INTRAVENOUS | Status: AC
  Administered 2022-12-22: 10 mg via INTRAVENOUS
  Filled 2022-12-22: qty 10

## 2022-12-22 MED ORDER — LORATADINE 10 MG PO TABS
10.0000 mg | ORAL_TABLET | Freq: Every day | ORAL | Status: DC
  Administered 2022-12-23 – 2022-12-25 (×3): 10 mg via ORAL
  Filled 2022-12-22 (×3): qty 1

## 2022-12-22 MED ORDER — APIXABAN 5 MG PO TABS
5.0000 mg | ORAL_TABLET | Freq: Once | ORAL | Status: AC
  Administered 2022-12-22: 5 mg via ORAL
  Filled 2022-12-22: qty 1

## 2022-12-22 MED ORDER — CITALOPRAM HYDROBROMIDE 20 MG PO TABS
40.0000 mg | ORAL_TABLET | Freq: Every day | ORAL | Status: DC
  Administered 2022-12-22 – 2022-12-25 (×4): 40 mg via ORAL
  Filled 2022-12-22: qty 4
  Filled 2022-12-22 (×2): qty 2
  Filled 2022-12-22: qty 4

## 2022-12-22 MED ORDER — METHYLPREDNISOLONE SODIUM SUCC 125 MG IJ SOLR
80.0000 mg | Freq: Once | INTRAMUSCULAR | Status: AC
  Administered 2022-12-22: 80 mg via INTRAVENOUS
  Filled 2022-12-22: qty 2

## 2022-12-22 MED ORDER — METOCLOPRAMIDE HCL 5 MG/ML IJ SOLN
5.0000 mg | Freq: Once | INTRAMUSCULAR | Status: AC
  Administered 2022-12-22: 5 mg via INTRAVENOUS
  Filled 2022-12-22: qty 2

## 2022-12-22 MED ORDER — UMECLIDINIUM BROMIDE 62.5 MCG/ACT IN AEPB
1.0000 | INHALATION_SPRAY | Freq: Every day | RESPIRATORY_TRACT | Status: DC
  Administered 2022-12-24 – 2022-12-25 (×2): 1 via RESPIRATORY_TRACT
  Filled 2022-12-22 (×2): qty 7

## 2022-12-22 MED ORDER — LACTATED RINGERS IV BOLUS
1000.0000 mL | Freq: Once | INTRAVENOUS | Status: AC
  Administered 2022-12-22: 1000 mL via INTRAVENOUS

## 2022-12-22 MED ORDER — GUAIFENESIN ER 600 MG PO TB12
600.0000 mg | ORAL_TABLET | Freq: Two times a day (BID) | ORAL | Status: DC
  Administered 2022-12-23 – 2022-12-25 (×4): 600 mg via ORAL
  Filled 2022-12-22 (×5): qty 1

## 2022-12-22 NOTE — Consult Note (Signed)
Cardiology Consultation:   Patient ID: [name]; [id number]; 1950-09-15   Admit date: 12/22/2022 Date of Consult: 12/22/2022  Primary Care Provider: Mayer Masker, PA-C (Inactive) Primary Cardiologist: None  Primary Electrophysiologist:  None   Patient Profile:   [name] is a 72 y.o. female with a hx of COPD who is being seen today for the evaluation of Dr. Katrinka Blazing at the request of tachycardia.  History of Present Illness:   [name]  was seen years ago by Dr. Gala Romney.  She was status post treatment for breast cancer and she had Herceptin and an EF of 34% at one point .   The last EF that I see was in 2011 and it was 55 - 60%.    She presented to the ED today with productive cough.  She is being treated with COPD and bronchitis.  She is noted to be in atrial fib with rapid rate.  She was started on Eliquis and Cardizem drip.    She has had no intervening cardiac issues.  She has COPD but is not O2 dependent and says she does relatively well with this.   She was caring for her husband who had a brain tumor and died two weeks ago.  She thought she was not feeling well because of this stress.  She started to have increased SOB with cough and then low grade fever.  She did not necessarily feel her heart rate going fast or palpitations other than what she thought was normal for her level of SOB.  She was not having presyncope or syncope.  She did not have chest pain, neck or arm pain.    She went to see her family MD and was noted to have tachycardia.  This likely represents sinus tach or multifocal atrial tach.  Rhythm strips in the ED show brief runs of MAT versus atrial fib.  She does not feel this.    Of note she was hypokalemic in the ED.     Past Medical History:  Diagnosis Date   Anemia    B12 deficient   B12 deficiency    Breast cancer    left breast.   Chronic SI joint pain    Bilateral, Dr. Corliss Skains   Fibromyalgia    Dr. Corliss Skains   GERD  (gastroesophageal reflux disease)    Greater trochanteric bursitis of both hips    Dr. Corliss Skains   Hyperlipidemia    IBS (irritable bowel syndrome)    LV dysfunction    iatrogenic from chemotherapy ; on Carvedilol    Migraines    Osteoporosis    Dr Lomax---> transferring to a new gyn    Past Surgical History:  Procedure Laterality Date   ABDOMINAL HYSTERECTOMY     BSO for  Endometriosis   CARPAL TUNNEL RELEASE     left   CERVICAL DISC SURGERY     COLONOSCOPY W/ POLYPECTOMY  1999   Dr Kinnie Scales   ESOPHAGEAL DILATION  2005   Dr Kinnie Scales   MASTECTOMY  2009   bilateral, Dr Jamey Ripa   SEPTOPLASTY       Home Medications:  Prior to Admission medications   Medication Sig Start Date End Date Taking? Authorizing Provider  albuterol (VENTOLIN HFA) 108 (90 Base) MCG/ACT inhaler INHALE 1-2 PUFFS BY MOUTH EVERY 6 HOURS AS NEEDED FOR WHEEZE OR SHORTNESS OF BREATH Patient taking differently: Inhale 1 puff into the lungs every 4 (four) hours as needed for shortness of breath. 11/16/22  Yes Jetty Duhamel D, MD  cetirizine (ZYRTEC) 10 MG tablet Take 10 mg by mouth daily.   Yes [provider]  citalopram (CELEXA) 40 MG tablet 1po qd Filled by Arlana Hove-  Nurse Practitioner, [facility] Patient taking differently: Take 40 mg by mouth daily. 09/12/19  Yes Opalski, Gavin Pound, DO  hydrOXYzine (ATARAX/VISTARIL) 10 MG tablet Take 10 mg by mouth every 6 (six) hours as needed for anxiety. 05/31/20  Yes [provider]  methocarbamol (ROBAXIN) 750 MG tablet TAKE 1 TABLET BY MOUTH 2 TIMES DAILY AS NEEDED FOR MUSCLE SPASMS (NON FORMULARY) Patient taking differently: Take 750 mg by mouth every 6 (six) hours as needed for muscle spasms. 11/24/22  Yes Gearldine Bienenstock, PA-C  umeclidinium bromide (INCRUSE ELLIPTA) 62.5 MCG/ACT AEPB TAKE 1 PUFF BY MOUTH EVERY DAY Patient taking differently: Inhale 1 puff into the lungs daily. 06/08/22  Yes Young, Joni Fears D, MD  Calcium Carbonate-Vitamin D  600-400 MG-UNIT tablet Take 1 tablet by mouth 2 (two) times daily. Patient not taking: Reported on 12/22/2022 10/14/17   Thomasene Lot, DO  Spacer/Aero-Holding Chambers (AEROCHAMBER MV) inhaler Use as instructed Patient not taking: Reported on 12/22/2022 11/18/20   Waymon Budge, MD  Vitamin D, Ergocalciferol, (DRISDOL) 1.25 MG (50000 UNIT) CAPS capsule TAKE 1 CAPSULE BY MOUTH EVERY 7 DAYS Patient not taking: Reported on 07/29/2022 11/23/19   Thomasene Lot, DO    Inpatient Medications: Scheduled Meds:  albuterol  2.5 mg Nebulization QID   citalopram  40 mg Oral Daily   guaiFENesin  600 mg Oral BID   [START ON 12/23/2022] loratadine  10 mg Oral Daily   potassium chloride  40 mEq Oral Once   sodium chloride flush  3 mL Intravenous Q12H   umeclidinium bromide  1 puff Inhalation Daily   Continuous Infusions:  diltiazem (CARDIZEM) infusion 5 mg/hr (12/22/22 1403)   PRN Meds: hydrOXYzine, methocarbamol  Allergies:    Allergies  Allergen Reactions   Amoxicillin-Pot Clavulanate     diarrhea   Doxycycline     Nausea & vomiting   Venlafaxine     ? Reaction; ? Blurred vision    Social History:   Social History   Socioeconomic History   Marital status: Married    Spouse name: Not on file   Number of children: 1   Years of education: Not on file   Highest education level: Not on file  Occupational History   Occupation: retired, Warehouse manager   Tobacco Use   Smoking status: Former    Packs/day: 1.00    Years: 30.00    Additional pack years: 0.00    Total pack years: 30.00    Types: Cigarettes    Quit date: 08/31/1990    Years since quitting: 32.3    Passive exposure: Never   Smokeless tobacco: Never  Vaping Use   Vaping Use: Never used  Substance and Sexual Activity   Alcohol use: No   Drug use: Not Currently    Types: Marijuana    Comment: 08/01/2017 last used    Sexual activity: Not Currently  Other Topics Concern   Not on file  Social History Narrative   Lives w/  husband   Social Determinants of Health   Financial Resource Strain: Not on file  Food Insecurity: Not on file  Transportation Needs: Not on file  Physical Activity: Not on file  Stress: Not on file  Social Connections: Not on file  Intimate Partner Violence: Not on file  Family History:    Family History  Problem Relation Age of Onset   Diabetes Mother    Breast cancer Mother    Depression Mother        anxiety   Heart disease Mother        in her 9s   Colon cancer Sister    Healthy Daughter    Stroke Neg Hx      ROS:  Please see the history of present illness.  ROS  All other ROS reviewed and negative.     Physical Exam/Data:   Vitals:   12/22/22 1430 12/22/22 1445 12/22/22 1525 12/22/22 1730  BP: 102/60 113/63 121/67   Pulse: (!) 105 100 97   Resp: 11 (!) 9 18   Temp:   99.1 F (37.3 C)   TempSrc:      SpO2: 97% 97% 94% 94%    Intake/Output Summary (Last 24 hours) at 12/22/2022 1945 Last data filed at 12/22/2022 1454 Gross per 24 hour  Intake 1012.14 ml  Output --  Net 1012.14 ml   There were no vitals filed for this visit. There is no height or weight on file to calculate BMI.  GENERAL:  Thin appearing, no acute distress HEENT:   Pupils equal round and reactive, fundi not visualized, oral mucosa unremarkable NECK:  No  jugular venous distention, waveform within normal limits, carotid upstroke brisk and symmetric, no bruits, no thyromegaly LYMPHATICS:  No cervical, inguinal adenopathy LUNGS:    Decreased breath sounds with diffuse wheezing BACK:  No CVA tenderness CHEST:   Unremarkable HEART:  PMI not displaced or sustained,S1 and S2 within normal limits, no S3, no S4, no clicks, no rubs,  murmurs ABD:  Flat, positive bowel sounds normal in frequency in pitch, no bruits, no rebound, no guarding, no midline pulsatile mass, no hepatomegaly, no splenomegaly EXT:  2 plus pulses throughout, no  edema, no cyanosis no clubbing SKIN:  No rashes no  nodules NEURO:   Cranial nerves II through XII grossly intact, motor grossly intact throughout PSYCH:    Cognitively intact, oriented to person place and time   EKG:  The EKG was personally reviewed and demonstrates:  Sinus tachycardia, MAT, IRBBB.   Telemetry:  Telemetry was personally reviewed and demonstrates:  NSR, brief runs of MAT  Relevant CV Studies: NA  Laboratory Data:  Chemistry Recent Labs  Lab 12/22/22 1300  NA 134*  K 2.8*  CL 97*  CO2 21*  GLUCOSE 144*  BUN 21  CREATININE 0.94  CALCIUM 9.4  GFRNONAA >60  ANIONGAP 16*    No results for input(s): "PROT", "ALBUMIN", "AST", "ALT", "ALKPHOS", "BILITOT" in the last 168 hours. Hematology Recent Labs  Lab 12/22/22 1300  WBC 11.6*  RBC 4.76  HGB 14.5  HCT 43.5  MCV 91.4  MCH 30.5  MCHC 33.3  RDW 12.9  PLT 265   Cardiac EnzymesNo results for input(s): "TROPONINI" in the last 168 hours. No results for input(s): "TROPIPOC" in the last 168 hours.  BNP Recent Labs  Lab 12/22/22 1300  BNP 49.0    DDimer No results for input(s): "DDIMER" in the last 168 hours.  Radiology/Studies:  DG Chest Port 1 View  Result Date: 12/22/2022 CLINICAL DATA:  Cough EXAM: PORTABLE CHEST 1 VIEW COMPARISON:  04/21/2022 FINDINGS: Hyperinflation. No consolidation, pneumothorax, effusion or edema. Normal cardiopericardial silhouette. Curvature of the spine. Surgical changes overlying the left hemithorax. Fixation hardware along the lower cervical spine. Overlapping cardiac leads. IMPRESSION: Hyperinflation.  No  acute cardiopulmonary disease Electronically Signed   By: Karen Kays M.D.   On: 12/22/2022 13:29    Assessment and Plan:   ATRIAL TACHYCARDIA WITH RAPID VENTRICULAR RATE.:  Interesting case as this would be classified as atrial high rate episodes or subclinical atrial fib if we were discovering this incidentally on a wearable.  This appears to be short duration.  This is discussed extensively in the new atrial fib  guidelines and the bottom line is that treatment is based on CHA2DS2 - VASc scoring AND the burden of the arrhythmia (less than 6 min vs greater than 24 hours with a grey zone in between.)  At this point I am not convinced that this is fib and if so it is thus far of short duration.  Ms. DONETTA ISAZA has a CHA2DS2 - VASc score of 2.     I think it is prudent to hold off on DOAC and check a 2 - 4 week monitor after she has recovered from any acute pulmonary process.  I will discontinue the Dilt IV tonight so we can see her rhythm without this on board tonight.  I will hold the Eliquis and we can make this decision based on the tele tonight or monitoring as an out patient.  I do agree with the echo the results of which will influence this discussion.     For questions or updates, please contact [facility] Please consult [email or web address] for contact info under Cardiology/STEMI.   Signed, Rollene Rotunda, MD  12/22/2022 7:45 PM

## 2022-12-22 NOTE — ED Provider Notes (Signed)
[place] EMERGENCY DEPARTMENT AT [facility] Provider Note   CSN: [id number] Arrival date & time: 12/22/22  1234     History  Chief Complaint  Patient presents with   Atrial Fibrillation    [name] is a 72 y.o. female.  Patient with history of COPD, GERD, breast cancer, fibromyalgia, IBS sent from her PCPs office with tachycardia and concern for A-fib with RVR.  No history of same.  She has been "sick" with a cough, URI and congestion for the past week or so.  Does have COPD and uses her inhalers at baseline.  Cough relatively clear and yellow mucus.  No known fever.  No chest pain.  Did not know if she was tachycardic and not having palpitations.  Not feeling dizzy or lightheaded.  Denies chest pain.  Denies history of atrial fibrillation.  Found to be in a rapid rhythm up to 160s at her PCPs office.   Denies having atrial fibrillation in the past.  She is not anticoagulated.  Denies any history of CHF.  No leg pain or leg swelling.  The history is provided by the patient. The history is limited by the condition of the patient.  Atrial Fibrillation Associated symptoms include shortness of breath. Pertinent negatives include no chest pain, no abdominal pain and no headaches.       Home Medications Prior to Admission medications   Medication Sig Start Date End Date Taking? Authorizing Provider  albuterol (VENTOLIN HFA) 108 (90 Base) MCG/ACT inhaler INHALE 1-2 PUFFS BY MOUTH EVERY 6 HOURS AS NEEDED FOR WHEEZE OR SHORTNESS OF BREATH 11/16/22   Jetty Duhamel D, MD  Calcium Carbonate-Vitamin D 600-400 MG-UNIT tablet Take 1 tablet by mouth 2 (two) times daily. Patient not taking: Reported on 12/22/2022 10/14/17   Thomasene Lot, DO  cetirizine (ZYRTEC) 10 MG tablet Take 10 mg by mouth daily.    [provider]  citalopram (CELEXA) 40 MG tablet 1po qd Filled by Arlana Hove-  Nurse Practitioner, [facility] Patient not taking: Reported on 12/22/2022  09/12/19   Thomasene Lot, DO  fluconazole (DIFLUCAN) 150 MG tablet Take 1 tablet (150 mg total) by mouth daily. Patient not taking: Reported on 04/21/2022 03/21/21   Jetty Duhamel D, MD  hydrOXYzine (ATARAX/VISTARIL) 10 MG tablet Take 10 mg by mouth 2 (two) times daily. Patient not taking: Reported on 12/22/2022 05/31/20   [provider]  methocarbamol (ROBAXIN) 750 MG tablet TAKE 1 TABLET BY MOUTH 2 TIMES DAILY AS NEEDED FOR MUSCLE SPASMS (NON FORMULARY) 11/24/22   Gearldine Bienenstock, PA-C  metoCLOPramide (REGLAN) 5 MG tablet Take 1 tablet (5 mg total) by mouth every 8 (eight) hours as needed for nausea. Patient not taking: Reported on 12/22/2022 06/29/22   Mayer Masker, PA-C  Probiotic Product (PROBIOTIC PO) Take 1 tablet by mouth daily. Patient not taking: Reported on 07/29/2022    [provider]  Spacer/Aero-Holding Chambers (AEROCHAMBER MV) inhaler Use as instructed Patient not taking: Reported on 12/22/2022 11/18/20   Jetty Duhamel D, MD  umeclidinium bromide (INCRUSE ELLIPTA) 62.5 MCG/ACT AEPB TAKE 1 PUFF BY MOUTH EVERY DAY 06/08/22   Young, Joni Fears D, MD  umeclidinium bromide (INCRUSE ELLIPTA) 62.5 MCG/INH AEPB Inhale 1 puff into the lungs daily. 03/20/21   Waymon Budge, MD  Vitamin D, Ergocalciferol, (DRISDOL) 1.25 MG (50000 UNIT) CAPS capsule TAKE 1 CAPSULE BY MOUTH EVERY 7 DAYS Patient not taking: Reported on 07/29/2022 11/23/19   Thomasene Lot, DO  Allergies    Amoxicillin-pot clavulanate, Doxycycline, and Venlafaxine    Review of Systems   Review of Systems  Constitutional:  Negative for activity change, appetite change and fever.  HENT:  Positive for congestion and rhinorrhea.   Respiratory:  Positive for cough, chest tightness and shortness of breath.   Cardiovascular:  Positive for palpitations. Negative for chest pain.  Gastrointestinal:  Negative for abdominal pain, nausea and vomiting.  Genitourinary:  Negative for dysuria and hematuria.   Musculoskeletal:  Negative for arthralgias and myalgias.  Skin:  Negative for rash.  Neurological:  Negative for dizziness, weakness, light-headedness and headaches.   all other systems are negative except as noted in the HPI and PMH.    Physical Exam Updated Vital Signs BP 135/85   Pulse (!) 131   Temp 98.1 F (36.7 C) (Oral)   Resp 16   SpO2 98%  Physical Exam Vitals and nursing note reviewed.  Constitutional:      General: She is not in acute distress.    Appearance: She is well-developed. She is ill-appearing.  HENT:     Head: Normocephalic and atraumatic.     Mouth/Throat:     Pharynx: No oropharyngeal exudate.  Eyes:     Conjunctiva/sclera: Conjunctivae normal.     Pupils: Pupils are equal, round, and reactive to light.  Neck:     Comments: No meningismus. Cardiovascular:     Rate and Rhythm: Tachycardia present. Rhythm irregular.     Heart sounds: Normal heart sounds. No murmur heard.    Comments: Irregular tachycardia 160s Pulmonary:     Effort: Pulmonary effort is normal. No respiratory distress.     Breath sounds: Normal breath sounds.  Chest:     Chest wall: No tenderness.  Abdominal:     Palpations: Abdomen is soft.     Tenderness: There is no abdominal tenderness. There is no guarding or rebound.  Musculoskeletal:        General: No tenderness. Normal range of motion.     Cervical back: Normal range of motion and neck supple.     Right lower leg: No edema.     Left lower leg: No edema.  Skin:    General: Skin is warm.  Neurological:     Mental Status: She is alert and oriented to person, place, and time.     Cranial Nerves: No cranial nerve deficit.     Motor: No abnormal muscle tone.     Coordination: Coordination normal.     Comments:  5/5 strength throughout. CN 2-12 intact.Equal grip strength.   Psychiatric:        Behavior: Behavior normal.     ED Results / Procedures / Treatments   Labs (all labs ordered are listed, but only abnormal  results are displayed) Labs Reviewed  BASIC METABOLIC PANEL - Abnormal; Notable for the following components:      Result Value   Sodium 134 (*)    Potassium 2.8 (*)    Chloride 97 (*)    CO2 21 (*)    Glucose, Bld 144 (*)    Anion gap 16 (*)    All other components within normal limits  CBC - Abnormal; Notable for the following components:   WBC 11.6 (*)    All other components within normal limits  LACTIC ACID, PLASMA - Abnormal; Notable for the following components:   Lactic Acid, Venous 2.2 (*)    All other components within normal limits  SARS CORONAVIRUS 2 BY  RT PCR  CULTURE, BLOOD (ROUTINE X 2)  CULTURE, BLOOD (ROUTINE X 2)  TSH  BRAIN NATRIURETIC PEPTIDE  LACTIC ACID, PLASMA  PROCALCITONIN  TROPONIN I (HIGH SENSITIVITY)  TROPONIN I (HIGH SENSITIVITY)    EKG EKG Interpretation  Date/Time:  Tuesday December 22 2022 13:00:16 EDT Ventricular Rate:  161 PR Interval:    QRS Duration: 108 QT Interval:  292 QTC Calculation: 477 R Axis:   94 Text Interpretation: Atrial fibrillation with rapid ventricular response Right bundle branch block Marked T wave abnormality, consider inferior ischemia Abnormal ECG When compared with ECG of 22-Sep-2017 10:06, PREVIOUS ECG IS PRESENT now A fib with RVR diffuse ST depressions Confirmed by Glynn Octave [phone]) on 12/22/2022 1:06:45 PM  Radiology DG Chest Port 1 View  Result Date: 12/22/2022 CLINICAL DATA:  Cough EXAM: PORTABLE CHEST 1 VIEW COMPARISON:  04/21/2022 FINDINGS: Hyperinflation. No consolidation, pneumothorax, effusion or edema. Normal cardiopericardial silhouette. Curvature of the spine. Surgical changes overlying the left hemithorax. Fixation hardware along the lower cervical spine. Overlapping cardiac leads. IMPRESSION: Hyperinflation.  No acute cardiopulmonary disease Electronically Signed   By: Karen Kays M.D.   On: 12/22/2022 13:29    Procedures .Critical Care  Performed by: Glynn Octave, MD Authorized by:  Glynn Octave, MD   Critical care provider statement:    Critical care time (minutes):  35   Critical care time was exclusive of:  Separately billable procedures and treating other patients   Critical care was necessary to treat or prevent imminent or life-threatening deterioration of the following conditions: Afib with RVR.   Critical care was time spent personally by me on the following activities:  Development of treatment plan with patient or surrogate, discussions with consultants, evaluation of patient's response to treatment, examination of patient, ordering and review of laboratory studies, ordering and review of radiographic studies, ordering and performing treatments and interventions, pulse oximetry, re-evaluation of patient's condition and review of old charts     Medications Ordered in ED Medications  diltiazem (CARDIZEM) 1 mg/mL load via infusion 10 mg (has no administration in time range)    And  diltiazem (CARDIZEM) 125 mg in dextrose 5% 125 mL (1 mg/mL) infusion (has no administration in time range)  metoCLOPramide (REGLAN) injection 5 mg (has no administration in time range)  lactated ringers bolus 1,000 mL (1,000 mLs Intravenous New Bag/Given 12/22/22 1319)  aspirin chewable tablet 324 mg (324 mg Oral Given 12/22/22 1318)    ED Course/ Medical Decision Making/ A&P                             Medical Decision Making Amount and/or Complexity of Data Reviewed Labs: ordered. Decision-making details documented in ED Course. Radiology: ordered and independent interpretation performed. Decision-making details documented in ED Course. ECG/medicine tests: ordered and independent interpretation performed. Decision-making details documented in ED Course.  Risk OTC drugs. Prescription drug management. Decision regarding hospitalization.   From PCPs office with URI symptoms as well as rapidly heartbeat for the past 1 week.  Denies chest pain.  Tachycardic to the 150s and  160s on arrival.  EKG shows atrial fibrillation with diffuse ST depressions.  Will hydrate, attempt rate control with Cardizem.  Check labs.  Chest x-ray, will rule out sepsis from pneumonia.  Labs remarkable for mild lactate elevation 2.2.  Potassium 2.8.  Heart rate improving on Cardizem. Chest x-ray negative for pneumonia.  Results reviewed interpreted by me.  Patient given bronchodilators  and steroids for presumed COPD exacerbation though she is not significantly wheezing.  Patient did convert to sinus rhythm on Cardizem.  Will likely need to initiate anticoagulation given her elevated CHA2DS2-VASc score of 3.   Patient has converted to sinus rhythm.  Will initiate Eliquis.  Continue treatment with bronchodilators and steroids for presumed COPD exacerbation and bronchitis.  Continue IV hydration.  Admission d/w Dr. Katrinka Blazing.            Final Clinical Impression(s) / ED Diagnoses Final diagnoses:  None    Rx / DC Orders ED Discharge Orders     None         Jama Krichbaum, Jeannett Senior, MD 12/22/22 1558

## 2022-12-22 NOTE — H&P (Addendum)
History and Physical    Patient: [name] FAO:[id number] DOB: 1951-05-03 DOA: 12/22/2022 DOS: the patient was seen and examined on 12/22/2022 PCP: Mayer Masker, PA-C (Inactive)  Patient coming from: From PCP office  Chief Complaint:  Chief Complaint  Patient presents with   Atrial Fibrillation   HPI: [name] is a 72 y.o. female with medical history significant of hyperlipidemia, COPD mixed type followed by Dr. Maple Hudson of pulmonology, anemia, history of breast cancer, GERD, who presents with complaints of productive cough.  Symptoms initially started about 9 days ago with complaints of cough.  Sinus congestion and sneezing.  She initially thought symptoms were related to her allergies.  Associated symptoms included sore throat, wheezing, loose stools, hot/cold flashes, and reports weight loss of approximately 5 pounds over last few months.    5 days ago she started having low-grade fevers and reported feeling more short of breath with any kind of movement or activity.  Patient denied having any chest pain or palpitations, or leg swelling. She does make note that she has been caring for her husband who had been sick and passed away approximately 2 weeks ago.  She makes note that she had a prior 50 smoking pack-year history, but quit over 20 years ago.  In the emergency department patient was noted to be afebrile with heart rates initially elevated into the 150s, and all other vital signs maintained.  Labs noted WBC 11.6, sodium 134, potassium 2.8, CO2 21, glucose 144, anion gap 16, TSH 3.375 and lactic acid 2.2.  Chest x-ray noted hyperinflation without acute abnormality.  COVID-19 screening was negative.  Patient be given full dose aspirin, 1 L of lactated Ringer's, Reglan, Solu-Medrol 80 mg IV, and Cardizem gtt.  review of Systems: As mentioned in the history of present illness. All other systems reviewed and are negative. Past Medical History:  Diagnosis Date   Anemia    B12  deficient   B12 deficiency    Breast cancer    left breast.   Chronic SI joint pain    Bilateral, Dr. Corliss Skains   Fibromyalgia    Dr. Corliss Skains   GERD (gastroesophageal reflux disease)    Greater trochanteric bursitis of both hips    Dr. Corliss Skains   Hyperlipidemia    IBS (irritable bowel syndrome)    LV dysfunction    iatrogenic from chemotherapy ; on Carvedilol    Migraines    Osteoporosis    Dr Lomax---> transferring to a new gyn   Past Surgical History:  Procedure Laterality Date   ABDOMINAL HYSTERECTOMY     BSO for  Endometriosis   CARPAL TUNNEL RELEASE     left   CERVICAL DISC SURGERY     COLONOSCOPY W/ POLYPECTOMY  1999   Dr Kinnie Scales   ESOPHAGEAL DILATION  2005   Dr Kinnie Scales   MASTECTOMY  2009   bilateral, Dr Leafy Ro     Social History:  reports that she quit smoking about 32 years ago. Her smoking use included cigarettes. She has a 30.00 pack-year smoking history. She has never been exposed to tobacco smoke. She has never used smokeless tobacco. She reports that she does not currently use drugs after having used the following drugs: Marijuana. She reports that she does not drink alcohol.  Allergies  Allergen Reactions   Amoxicillin-Pot Clavulanate     diarrhea   Doxycycline     Nausea & vomiting   Venlafaxine     ? Reaction; ?  Blurred vision    Family History  Problem Relation Age of Onset   Diabetes Mother    Breast cancer Mother    Depression Mother        anxiety   Heart disease Mother        in her 66s   Colon cancer Sister    Healthy Daughter    Stroke Neg Hx     Prior to Admission medications   Medication Sig Start Date End Date Taking? Authorizing Provider  albuterol (VENTOLIN HFA) 108 (90 Base) MCG/ACT inhaler INHALE 1-2 PUFFS BY MOUTH EVERY 6 HOURS AS NEEDED FOR WHEEZE OR SHORTNESS OF BREATH 11/16/22   Jetty Duhamel D, MD  Calcium Carbonate-Vitamin D 600-400 MG-UNIT tablet Take 1 tablet by mouth 2 (two) times daily. Patient not  taking: Reported on 12/22/2022 10/14/17   Thomasene Lot, DO  cetirizine (ZYRTEC) 10 MG tablet Take 10 mg by mouth daily.    [provider]  citalopram (CELEXA) 40 MG tablet 1po qd Filled by Arlana Hove-  Nurse Practitioner, [facility] Patient not taking: Reported on 12/22/2022 09/12/19   Thomasene Lot, DO  fluconazole (DIFLUCAN) 150 MG tablet Take 1 tablet (150 mg total) by mouth daily. Patient not taking: Reported on 04/21/2022 03/21/21   Jetty Duhamel D, MD  hydrOXYzine (ATARAX/VISTARIL) 10 MG tablet Take 10 mg by mouth 2 (two) times daily. Patient not taking: Reported on 12/22/2022 05/31/20   [provider]  methocarbamol (ROBAXIN) 750 MG tablet TAKE 1 TABLET BY MOUTH 2 TIMES DAILY AS NEEDED FOR MUSCLE SPASMS (NON FORMULARY) 11/24/22   Gearldine Bienenstock, PA-C  metoCLOPramide (REGLAN) 5 MG tablet Take 1 tablet (5 mg total) by mouth every 8 (eight) hours as needed for nausea. Patient not taking: Reported on 12/22/2022 06/29/22   Mayer Masker, PA-C  Probiotic Product (PROBIOTIC PO) Take 1 tablet by mouth daily. Patient not taking: Reported on 07/29/2022    [provider]  Spacer/Aero-Holding Chambers (AEROCHAMBER MV) inhaler Use as instructed Patient not taking: Reported on 12/22/2022 11/18/20   Jetty Duhamel D, MD  umeclidinium bromide (INCRUSE ELLIPTA) 62.5 MCG/ACT AEPB TAKE 1 PUFF BY MOUTH EVERY DAY 06/08/22   Young, Joni Fears D, MD  umeclidinium bromide (INCRUSE ELLIPTA) 62.5 MCG/INH AEPB Inhale 1 puff into the lungs daily. 03/20/21   Waymon Budge, MD  Vitamin D, Ergocalciferol, (DRISDOL) 1.25 MG (50000 UNIT) CAPS capsule TAKE 1 CAPSULE BY MOUTH EVERY 7 DAYS Patient not taking: Reported on 07/29/2022 11/23/19   Thomasene Lot, DO    Physical Exam: Vitals:   12/22/22 1400 12/22/22 1405 12/22/22 1430 12/22/22 1445  BP: 134/75  102/60 113/63  Pulse: (!) 122 (!) 109 (!) 105 100  Resp: 17  11 (!) 9  Temp:      TempSrc:      SpO2: 95%  97% 97%     Constitutional: elderly female who present  Eyes: PERRL, lids and conjunctivae normal ENMT: Mucous membranes are moist. Posterior pharynx clear of any exudate or lesions  Neck: normal, supple.  No significant JVD appreciated at this time. Respiratory: clear to auscultation bilaterally  Cardiovascular: Irregular irregular.  2+ pedal pulses. No carotid bruits.  Abdomen: no tenderness, no masses palpated. Bowel sounds positive.  Musculoskeletal: no clubbing / cyanosis. No joint deformity upper and lower extremities. Good ROM, no contractures. Normal muscle tone.  Skin: no rashes, lesions, ulcers. No induration Neurologic: CN 2-12 grossly intact. Strength 5/5 in all 4.  Psychiatric: Normal judgment and insight. Alert and  oriented x 3. Normal mood.   Data Reviewed:  EKG reveals atrial fibrillation at 133 bpm.  Reviewed labs, imaging, and pertinent records as noted above in HPI.  Assessment and Plan:  New onset atrial fibrillation Patient had been found to be in A-fib with heart rates initially elevated into the 150s.  Labs significant for TSH and BMP within limits and high-sensitivity troponins negative x 2.  CHA2DS2-VASc score equal to at least 2-3 based off age, sex, and +/- prior history of LV dysfunction related with chemotherapy. -Admit to a cardiac telemetry bed -Continue Cardizem drip -Goal potassium at least 4 and magnesium at least 2. -Check echocardiogram -Start Eliquis, but recommended to be held until further information available. -Cardiology consulted  COPD with acute bronchitis Upper respiratory infection Acute. Patient reports having symptoms of cough, congestion, wheezing, and low-grade fevers over the last week.  No wheezing currently appreciated on physical exam but decreased overall aeration.  Chest x-ray noted hyperinflation without acute abnormality.  COVID-19 screening was negative. -Incentive spirometry and flutter valve -Check complete respiratory virus  panel -Check procalcitonin -Albuterol nebs  -Continue Incruse inhaler -Mucinex  Hypokalemia Acute.  Initial potassium 2.8.  Patient had been given 40 mEq of potassium chloride p.o. -Given additional potassium chloride 40 mEq  -Continue to monitor and replace as needed  History of Cardiomyopathy Previously thought secondary to Herceptin with EF noted to be as low as 34% in 2009/2010 and subsequent recoveryin 2011 to 55-60%. No recent echo to compare.  Anxiety and depression -Continue Celexa and hydroxyzine as needed  History of breast cancer Patient previously diagnosed with left-sided breast cancer back in 11/2007.   She underwent bilateral mastectomies.  Patient was treated with Taxol and Herceptin chemotherapy. DVT prophylaxis: Eliquis Advance Care Planning:   Code Status: Full Code   Consults: Consults  Family Communication: none requested  Severity of Illness: The appropriate patient status for this patient is OBSERVATION. Observation status is judged to be reasonable and necessary in order to provide the required intensity of service to ensure the patient's safety. The patient's presenting symptoms, physical exam findings, and initial radiographic and laboratory data in the context of their medical condition is felt to place them at decreased risk for further clinical deterioration. Furthermore, it is anticipated that the patient will be medically stable for discharge from the hospital within 2 midnights of admission.   Author: Clydie Braun, MD 12/22/2022 2:57 PM  For on call review [email or web address].

## 2022-12-22 NOTE — Progress Notes (Signed)
   Acute Office Visit  Subjective:     Patient ID: [name], female    DOB: 05-Aug-1951, 72 y.o.   MRN: [id number]  Chief Complaint  Patient presents with   Cough    Cough This is a new problem. The current episode started in the past 7 days. The problem has been gradually improving. The problem occurs hourly. The cough is Productive of sputum. The symptoms are aggravated by exercise. Her past medical history is significant for COPD.   Symptoms started with sore throat, HA, cough last Sunday. On Wednesday she developed a fever.  (99-100).  She has tkaen tylenol, zyrtec and hydroxzine with little relief. Pt feels like the symptoms 'peaked' yesterday.  Having some loose stools.  Her husband died on the 9th and hospice/visitors have been coming in and out and she thinks she may have gotten sick from that.     Review of Systems  Respiratory:  Positive for cough.         Objective:    BP 114/60   Pulse (!) 122   Temp 97.7 F (36.5 C) (Oral)   Ht 5' (1.524 m)   Wt 80 lb 0.1 oz (36.3 kg)   SpO2 94%   BMI 15.62 kg/m    Physical Exam Gen: alert, oriented.  Frail appearing elderly female.   Cv: irregularly irregular rhythm.  Tachycardic rate.  Pulm: diffuse inspiratory crackles b/l.     No results found for any visits on 12/22/22.      Assessment & Plan:   Problem List Items Addressed This Visit       Cardiovascular and Mediastinum   Atrial fibrillation    EKG performed for irregular rhythm.  Machine reads 'undetermined rhythm" but on my read she has a irregularly irregular tachycardic rhythm consistent with afib w/ RVR.   Also of note there was new ST depressions in V3.   If pt needs anti arrhythmic medication she will need to be monitored for hypotension given her already borderline low bp's.           Other   Acute cough - Primary    1 week of productive cough, fever, sore throat, mild diarrhea in a pt with a hx of copd not on home o2.  Exam shows  diffuse crackles.  No wheezing.  O2 sats good on room air.   - would favor viral over bacterial cause given her upper airway symptoms earlier and the diffuse crackles on exam.  - sent pt to ed d/t her heart arrhythmia.  She will likely trigger sepsis protocol when evaluated.   - did not start any new medications/abx in pt until she was evaluated by ED - recommended f/u within one week of d/c from ED or hospital.        Other Visit Diagnoses     Tachycardia       Relevant Orders   EKG 12-Lead       No orders of the defined types were placed in this encounter.   No follow-ups on file.  Sandre Kitty, MD

## 2022-12-22 NOTE — Patient Instructions (Signed)
I would like you to go to the emergency department.    They need to evaluate you for something called atrial fibrillation with RVR.    They also need to evaluate the changes in your ST segment on EKG.  They will know what this means.   Please call our office after you are discharged from the ER to schedule a follow up later this week or early next week.    Dr. Constance Goltz

## 2022-12-22 NOTE — Assessment & Plan Note (Addendum)
1 week of productive cough, fever, sore throat, mild diarrhea in a pt with a hx of copd not on home o2.  Exam shows diffuse crackles.  No wheezing.  O2 sats good on room air.   - would favor viral over bacterial cause given her upper airway symptoms earlier and the diffuse crackles on exam.  - sent pt to ed d/t her heart arrhythmia.  She will likely trigger sepsis protocol when evaluated.   - did not start any new medications/abx in pt until she was evaluated by ED - recommended f/u within one week of d/c from ED or hospital.

## 2022-12-22 NOTE — ED Triage Notes (Signed)
Pt arrives from PCP for eval of afib RVR. Patient reports being sick with sore throat and fever since Friday and now having SOB.

## 2022-12-22 NOTE — Assessment & Plan Note (Signed)
EKG performed for irregular rhythm.  Machine reads 'undetermined rhythm" but on my read she has a irregularly irregular tachycardic rhythm consistent with afib w/ RVR.   Also of note there was new ST depressions in V3.   If pt needs anti arrhythmic medication she will need to be monitored for hypotension given her already borderline low bp's.

## 2022-12-23 ENCOUNTER — Observation Stay (HOSPITAL_COMMUNITY): Payer: Medicare Other

## 2022-12-23 ENCOUNTER — Encounter (HOSPITAL_COMMUNITY): Payer: Self-pay | Admitting: Internal Medicine

## 2022-12-23 DIAGNOSIS — Z8679 Personal history of other diseases of the circulatory system: Secondary | ICD-10-CM | POA: Diagnosis not present

## 2022-12-23 DIAGNOSIS — R000 Tachycardia, unspecified: Secondary | ICD-10-CM | POA: Diagnosis not present

## 2022-12-23 DIAGNOSIS — E876 Hypokalemia: Secondary | ICD-10-CM | POA: Diagnosis not present

## 2022-12-23 DIAGNOSIS — J440 Chronic obstructive pulmonary disease with acute lower respiratory infection: Secondary | ICD-10-CM | POA: Diagnosis not present

## 2022-12-23 DIAGNOSIS — I4891 Unspecified atrial fibrillation: Secondary | ICD-10-CM | POA: Diagnosis not present

## 2022-12-23 LAB — CBC
HCT: 36.6 % (ref 36.0–46.0)
Hemoglobin: 12.8 g/dL (ref 12.0–15.0)
MCH: 31.1 pg (ref 26.0–34.0)
MCHC: 35 g/dL (ref 30.0–36.0)
MCV: 89.1 fL (ref 80.0–100.0)
Platelets: 243 10*3/uL (ref 150–400)
RBC: 4.11 MIL/uL (ref 3.87–5.11)
RDW: 12.8 % (ref 11.5–15.5)
WBC: 7.1 10*3/uL (ref 4.0–10.5)
nRBC: 0 % (ref 0.0–0.2)

## 2022-12-23 LAB — ECHOCARDIOGRAM COMPLETE
Area-P 1/2: 3.48 cm2
Height: 60 in
S' Lateral: 1.9 cm
Weight: 1280.08 oz

## 2022-12-23 LAB — BASIC METABOLIC PANEL
Anion gap: 8 (ref 5–15)
BUN: 16 mg/dL (ref 8–23)
CO2: 24 mmol/L (ref 22–32)
Calcium: 8.8 mg/dL — ABNORMAL LOW (ref 8.9–10.3)
Chloride: 101 mmol/L (ref 98–111)
Creatinine, Ser: 0.69 mg/dL (ref 0.44–1.00)
GFR, Estimated: >60 mL/min (ref >60)
Glucose, Bld: 196 mg/dL — ABNORMAL HIGH (ref 70–99)
Potassium: 4.1 mmol/L (ref 3.5–5.1)
Sodium: 133 mmol/L — ABNORMAL LOW (ref 135–145)

## 2022-12-23 LAB — CULTURE, BLOOD (ROUTINE X 2)

## 2022-12-23 LAB — MAGNESIUM: Magnesium: 1.9 mg/dL (ref 1.7–2.4)

## 2022-12-23 MED ORDER — ENOXAPARIN SODIUM 40 MG/0.4ML IJ SOSY
40.0000 mg | PREFILLED_SYRINGE | INTRAMUSCULAR | Status: DC
Start: 2022-12-23 — End: 2022-12-23

## 2022-12-23 MED ORDER — PREDNISONE 20 MG PO TABS
20.0000 mg | ORAL_TABLET | Freq: Every day | ORAL | Status: DC
  Administered 2022-12-23 – 2022-12-25 (×3): 20 mg via ORAL
  Filled 2022-12-23 (×3): qty 1

## 2022-12-23 MED ORDER — ENOXAPARIN SODIUM 300 MG/3ML IJ SOLN
20.0000 mg | INTRAMUSCULAR | Status: DC
  Administered 2022-12-23 – 2022-12-25 (×3): 20 mg via SUBCUTANEOUS
  Filled 2022-12-23 (×3): qty 0.2

## 2022-12-23 MED ORDER — ALBUTEROL SULFATE (2.5 MG/3ML) 0.083% IN NEBU: 2.5000 mg | INHALATION_SOLUTION | RESPIRATORY_TRACT | Status: DC | PRN

## 2022-12-23 MED ORDER — LEVALBUTEROL HCL 0.63 MG/3ML IN NEBU
0.6300 mg | INHALATION_SOLUTION | Freq: Three times a day (TID) | RESPIRATORY_TRACT | Status: DC
  Administered 2022-12-23 – 2022-12-24 (×2): 0.63 mg via RESPIRATORY_TRACT
  Filled 2022-12-23 (×2): qty 3

## 2022-12-23 NOTE — ED Notes (Signed)
ED TO INPATIENT HANDOFF REPORT  ED Nurse Name and Phone #: Delice Bison, RN  S Name/Age/Gender [name] 72 y.o. female Room/Bed: 037C/037C  Code Status   Code Status: Full Code  Home/SNF/Other Home Patient oriented to: self, place, time, and situation Is this baseline? Yes   Triage Complete: Triage complete  Chief Complaint New onset atrial fibrillation [I48.91]  Triage Note Pt arrives from PCP for eval of afib RVR. Patient reports being sick with sore throat and fever since Friday and now having SOB.    Allergies Allergies  Allergen Reactions   Amoxicillin-Pot Clavulanate     diarrhea   Doxycycline     Nausea & vomiting   Venlafaxine     ? Reaction; ? Blurred vision    Level of Care/Admitting Diagnosis ED Disposition     ED Disposition  Admit   Condition  --   Comment  Hospital Area: [facility] [[id number]]  Level of Care: Telemetry Cardiac [103]  May place patient in observation at [facility] or [facility] if equivalent level of care is available:: No  Covid Evaluation: Asymptomatic - no recent exposure (last 10 days) testing not required  Diagnosis: New onset atrial fibrillation [318337]  Admitting Physician: Clydie Braun [[id number]]  Attending Physician: Clydie Braun [[id number]]          B Medical/Surgery History Past Medical History:  Diagnosis Date   Anemia    B12 deficient   B12 deficiency    Breast cancer    left breast.   Chronic SI joint pain    Bilateral, Dr. Corliss Skains   Fibromyalgia    Dr. Corliss Skains   GERD (gastroesophageal reflux disease)    Greater trochanteric bursitis of both hips    Dr. Corliss Skains   Hyperlipidemia    IBS (irritable bowel syndrome)    LV dysfunction    iatrogenic from chemotherapy ; on Carvedilol    Migraines    Osteoporosis    Dr Lomax---> transferring to a new gyn   Past Surgical History:  Procedure Laterality Date   ABDOMINAL HYSTERECTOMY     BSO for  Endometriosis   CARPAL  TUNNEL RELEASE     left   CERVICAL DISC SURGERY     COLONOSCOPY W/ POLYPECTOMY  1999   Dr Kinnie Scales   ESOPHAGEAL DILATION  2005   Dr Kinnie Scales   MASTECTOMY  2009   bilateral, Dr Jamey Ripa   SEPTOPLASTY       A IV Location/Drains/Wounds Patient Lines/Drains/Airways Status     Active Line/Drains/Airways     Name Placement date Placement time Site Days   Peripheral IV 12/22/22 20 G Anterior;Proximal;Right Forearm 12/22/22  1319  Forearm  1   Peripheral IV 12/22/22 20 G Left Antecubital 12/22/22  1332  Antecubital  1            Intake/Output Last 24 hours  Intake/Output Summary (Last 24 hours) at 12/23/2022 1137 Last data filed at 12/22/2022 1454 Gross per 24 hour  Intake 1012.14 ml  Output --  Net 1012.14 ml    Labs/Imaging Results for orders placed or performed during the hospital encounter of 12/22/22 (from the past 48 hour(s))  Basic metabolic panel     Status: Abnormal   Collection Time: 12/22/22  1:00 PM  Result Value Ref Range   Sodium 134 (L) 135 - 145 mmol/L   Potassium 2.8 (L) 3.5 - 5.1 mmol/L   Chloride 97 (L) 98 - 111 mmol/L   CO2 21 (  L) 22 - 32 mmol/L   Glucose, Bld 144 (H) 70 - 99 mg/dL    Comment: Glucose reference range applies only to samples taken after fasting for at least 8 hours.   BUN 21 8 - 23 mg/dL   Creatinine, Ser 9.60 0.44 - 1.00 mg/dL   Calcium 9.4 8.9 - 45.4 mg/dL   GFR, Estimated >09 >81 mL/min    Comment: (NOTE) Calculated using the CKD-EPI Creatinine Equation (2021)    Anion gap 16 (H) 5 - 15    Comment: Performed at [name], [address], [place], [place] [address]  CBC     Status: Abnormal   Collection Time: 12/22/22  1:00 PM  Result Value Ref Range   WBC 11.6 (H) 4.0 - 10.5 K/uL   RBC 4.76 3.87 - 5.11 MIL/uL   Hemoglobin 14.5 12.0 - 15.0 g/dL   HCT 82.9 56.2 - 13.0 %   MCV 91.4 80.0 - 100.0 fL   MCH 30.5 26.0 - 34.0 pg   MCHC 33.3 30.0 - 36.0 g/dL   RDW 86.5 78.4 - 69.6 %   Platelets 265 150 - 400 K/uL   nRBC 0.0  0.0 - 0.2 %    Comment: Performed at [facility], [address], [place], [place] [address]  Troponin I (High Sensitivity)     Status: None   Collection Time: 12/22/22  1:00 PM  Result Value Ref Range   Troponin I (High Sensitivity) 6 <18 ng/L    Comment: (NOTE) Elevated high sensitivity troponin I (hsTnI) values and significant  changes across serial measurements may suggest ACS but many other  chronic and acute conditions are known to elevate hsTnI results.  Refer to the "Links" section for chest pain algorithms and additional  guidance. Performed at [facility], [address], [place], [place] [address]   TSH     Status: None   Collection Time: 12/22/22  1:00 PM  Result Value Ref Range   TSH 3.375 0.350 - 4.500 uIU/mL    Comment: Performed by a 3rd Generation assay with a functional sensitivity of <=0.01 uIU/mL. Performed at [facility], [address] [place], [place] [address]   Brain natriuretic peptide     Status: None   Collection Time: 12/22/22  1:00 PM  Result Value Ref Range   B Natriuretic Peptide 49.0 0.0 - 100.0 pg/mL    Comment: Performed at [facility], [address], [place], [place] [address]  SARS Coronavirus 2 by RT PCR (hospital order, performed in [facility] hospital lab) *cepheid single result test* Anterior Nasal Swab     Status: None   Collection Time: 12/22/22  1:01 PM   Specimen: Anterior Nasal Swab  Result Value Ref Range   SARS Coronavirus 2 by RT PCR NEGATIVE NEGATIVE    Comment: Performed at [facility], [address], [place], [place] [address]  Blood culture (routine x 2)     Status: None (Preliminary result)   Collection Time: 12/22/22  1:08 PM   Specimen: BLOOD LEFT ARM  Result Value Ref Range   Specimen Description BLOOD LEFT ARM    Special Requests      BOTTLES DRAWN AEROBIC AND ANAEROBIC Blood Culture results may not be optimal due to an inadequate volume of blood received in culture bottles    Culture      NO GROWTH < 24 HOURS Performed at [facility], [address] [place], [place] [address]    Report Status PENDING   Lactic acid, plasma     Status: Abnormal   Collection Time: 12/22/22  1:08 PM  Result Value Ref Range   Lactic Acid, Venous 2.2 (HH) 0.5 - 1.9 mmol/L    Comment: CRITICAL RESULT CALLED TO, READ BACK BY AND VERIFIED WITH E.ANELLO RN 1413 12/22/22 MCCORMICK K Performed at [facility], [address] [place], [place] [address]   Blood culture (routine x 2)     Status: None (Preliminary result)   Collection Time: 12/22/22  1:13 PM   Specimen: BLOOD RIGHT ARM  Result Value Ref Range   Specimen Description BLOOD RIGHT ARM    Special Requests      BOTTLES DRAWN AEROBIC AND ANAEROBIC Blood Culture results may not be optimal due to an inadequate volume of blood received in culture bottles   Culture      NO GROWTH < 24 HOURS Performed at [facility], [address] [place], [place] [address]    Report Status PENDING   Troponin I (High Sensitivity)     Status: None   Collection Time: 12/22/22  3:35 PM  Result Value Ref Range   Troponin I (High Sensitivity) 5 <18 ng/L    Comment: (NOTE) Elevated high sensitivity troponin I (hsTnI) values and significant  changes across serial measurements may suggest ACS but many other  chronic and acute conditions are known to elevate hsTnI results.  Refer to the "Links" section for chest pain algorithms and additional  guidance. Performed at [facility], [address] [place], [place] [address]   Procalcitonin     Status: None   Collection Time: 12/22/22  3:35 PM  Result Value Ref Range   Procalcitonin <0.10 ng/mL    Comment:        Interpretation: PCT (Procalcitonin) <= 0.5 ng/mL: Systemic infection (sepsis) is not likely. Local bacterial infection is possible. (NOTE)       Sepsis PCT Algorithm           Lower Respiratory Tract                                      Infection PCT  Algorithm    ----------------------------     ----------------------------         PCT < 0.25 ng/mL                PCT < 0.10 ng/mL          Strongly encourage             Strongly discourage   discontinuation of antibiotics    initiation of antibiotics    ----------------------------     -----------------------------       PCT 0.25 - 0.50 ng/mL            PCT 0.10 - 0.25 ng/mL               OR       >80% decrease in PCT            Discourage initiation of                                            antibiotics  Encourage discontinuation           of antibiotics    ----------------------------     -----------------------------         PCT >= 0.50 ng/mL              PCT 0.26 - 0.50 ng/mL               AND        <80% decrease in PCT             Encourage initiation of                                             antibiotics       Encourage continuation           of antibiotics    ----------------------------     -----------------------------        PCT >= 0.50 ng/mL                  PCT > 0.50 ng/mL               AND         increase in PCT                  Strongly encourage                                      initiation of antibiotics    Strongly encourage escalation           of antibiotics                                     -----------------------------                                           PCT <= 0.25 ng/mL                                                 OR                                        > 80% decrease in PCT                                      Discontinue / Do not initiate                                             antibiotics  Performed at [facility], [address] [place], [place] [address]   Respiratory (~20 pathogens) panel by PCR     Status: Abnormal   Collection Time: 12/22/22  5:12 PM   Specimen: Nasopharyngeal Swab; Respiratory  Result Value Ref Range   Adenovirus NOT DETECTED NOT DETECTED   Coronavirus 229E NOT DETECTED NOT DETECTED     Comment: (NOTE) The Coronavirus on the Respiratory Panel, DOES NOT test for the novel  Coronavirus (2019 nCoV) CORRECTED ON 04/23 AT 2239: PREVIOUSLY REPORTED AS NOT DETECTED    Coronavirus HKU1 NOT DETECTED NOT DETECTED   Coronavirus NL63 NOT DETECTED NOT DETECTED   Coronavirus OC43 NOT DETECTED NOT DETECTED   Metapneumovirus NOT DETECTED NOT DETECTED   Rhinovirus / Enterovirus NOT DETECTED NOT DETECTED   Influenza A NOT DETECTED NOT DETECTED   Influenza B NOT DETECTED NOT DETECTED   Parainfluenza Virus 1 NOT DETECTED NOT DETECTED   Parainfluenza Virus 2 NOT DETECTED NOT DETECTED   Parainfluenza Virus 3 NOT DETECTED NOT DETECTED   Parainfluenza Virus 4 NOT DETECTED NOT DETECTED   Respiratory Syncytial Virus DETECTED (A) NOT DETECTED   Bordetella pertussis NOT DETECTED NOT DETECTED   Bordetella Parapertussis NOT DETECTED NOT DETECTED   Chlamydophila pneumoniae NOT DETECTED NOT DETECTED   Mycoplasma pneumoniae NOT DETECTED NOT DETECTED    Comment: Performed at [facility], [address] [place], [place] [address]  CBC     Status: None   Collection Time: 12/23/22  1:47 AM  Result Value Ref Range   WBC 7.1 4.0 - 10.5 K/uL   RBC 4.11 3.87 - 5.11 MIL/uL   Hemoglobin 12.8 12.0 - 15.0 g/dL   HCT 13.0 86.5 - 78.4 %   MCV 89.1 80.0 - 100.0 fL   MCH 31.1 26.0 - 34.0 pg   MCHC 35.0 30.0 - 36.0 g/dL   RDW 69.6 29.5 - 28.4 %   Platelets 243 150 - 400 K/uL   nRBC 0.0 0.0 - 0.2 %    Comment: Performed at [facility], [address], [place], [place] [address]  Magnesium     Status: None   Collection Time: 12/23/22  1:47 AM  Result Value Ref Range   Magnesium 1.9 1.7 - 2.4 mg/dL    Comment: Performed at [facility], [address], [place], [place] [address]  Basic metabolic panel     Status: Abnormal   Collection Time: 12/23/22  1:47 AM  Result Value Ref Range   Sodium 133 (L) 135 - 145 mmol/L   Potassium 4.1 3.5 - 5.1 mmol/L   Chloride 101 98 - 111 mmol/L    CO2 24 22 - 32 mmol/L   Glucose, Bld 196 (H) 70 - 99 mg/dL    Comment: Glucose reference range applies only to samples taken after fasting for at least 8 hours.   BUN 16 8 - 23 mg/dL   Creatinine, Ser 2.53 0.44 - 1.00 mg/dL   Calcium 8.8 (L) 8.9 - 10.3 mg/dL   GFR, Estimated >66 >44 mL/min    Comment: (NOTE) Calculated using the CKD-EPI Creatinine Equation (2021)    Anion gap 8 5 - 15    Comment: Performed at [facility], [address] [place], [place] [address]   DG Chest Port 1 View  Result Date: 12/22/2022 CLINICAL DATA:  Cough EXAM: PORTABLE CHEST 1 VIEW COMPARISON:  04/21/2022 FINDINGS: Hyperinflation. No consolidation, pneumothorax, effusion or edema. Normal cardiopericardial silhouette. Curvature of the spine. Surgical changes overlying the left hemithorax. Fixation hardware along the lower cervical spine. Overlapping cardiac leads. IMPRESSION: Hyperinflation.  No acute cardiopulmonary disease Electronically Signed   By: Scarlette Shorts  Chales Abrahams M.D.   On: 12/22/2022 13:29    Pending Labs Unresulted Labs (From admission, onward)     Start     Ordered   12/30/22 0500  Creatinine, serum  (enoxaparin (LOVENOX)    CrCl >/= 30 ml/min)  Weekly,   R     Comments: while on enoxaparin therapy    12/23/22 0733   12/22/22 1308  Lactic acid, plasma  Now then every 2 hours,   R (with STAT occurrences)      12/22/22 1307            Vitals/Pain Today's Vitals   12/23/22 0700 12/23/22 0800 12/23/22 0900 12/23/22 1000  BP: 111/70 120/73 122/77 111/64  Pulse: 78 82 87 (!) 101  Resp: 13 12 13 16   Temp:      TempSrc:      SpO2: 94% 97% 93% 94%  PainSc:        Isolation Precautions Droplet precaution  Medications Medications  sodium chloride flush (NS) 0.9 % injection 3 mL (3 mLs Intravenous Given 12/23/22 0914)  guaiFENesin (MUCINEX) 12 hr tablet 600 mg (600 mg Oral Patient Refused/Not Given 12/23/22 0910)  citalopram (CELEXA) tablet 40 mg (40 mg Oral Given 12/23/22 0910)   hydrOXYzine (ATARAX) tablet 10 mg (has no administration in time range)  loratadine (CLARITIN) tablet 10 mg (10 mg Oral Given 12/23/22 0910)  umeclidinium bromide (INCRUSE ELLIPTA) 62.5 MCG/ACT 1 puff (1 puff Inhalation Not Given 12/23/22 0917)  methocarbamol (ROBAXIN) tablet 750 mg (has no administration in time range)  enoxaparin (LOVENOX) 100 mg/mL injection 20 mg (20 mg Subcutaneous Given 12/23/22 0914)  predniSONE (DELTASONE) tablet 20 mg (20 mg Oral Given 12/23/22 1054)  levalbuterol (XOPENEX) nebulizer solution 0.63 mg (has no administration in time range)  albuterol (PROVENTIL) (2.5 MG/3ML) 0.083% nebulizer solution 2.5 mg (has no administration in time range)  lactated ringers bolus 1,000 mL (0 mLs Intravenous Stopped 12/22/22 1454)  diltiazem (CARDIZEM) 1 mg/mL load via infusion 10 mg (10 mg Intravenous Bolus from Bag 12/22/22 1335)  aspirin chewable tablet 324 mg (324 mg Oral Given 12/22/22 1318)  metoCLOPramide (REGLAN) injection 5 mg (5 mg Intravenous Given 12/22/22 1340)  methylPREDNISolone sodium succinate (SOLU-MEDROL) 125 mg/2 mL injection 80 mg (80 mg Intravenous Given 12/22/22 1548)  potassium chloride SA (KLOR-CON M) CR tablet 40 mEq (40 mEq Oral Given 12/22/22 1857)  apixaban (ELIQUIS) tablet 5 mg (5 mg Oral Given 12/22/22 1856)  potassium chloride SA (KLOR-CON M) CR tablet 40 mEq (40 mEq Oral Given 12/22/22 2027)    Mobility walks     Focused Assessments Pulmonary Assessment Handoff:  Lung sounds: L Breath Sounds: Diminished R Breath Sounds: Diminished O2 Device: Nasal Cannula  2L     R Recommendations: See Admitting Provider Note  Report given to:   Additional Notes:

## 2022-12-23 NOTE — Progress Notes (Signed)
PROGRESS NOTE    [name]  LKG:[id number] DOB: 02-17-1951 DOA: 12/22/2022 PCP: Mayer Masker, PA-C (Inactive)    Brief Narrative:   72 y.o. female with medical history significant of hyperlipidemia, COPD mixed type followed by Dr. Maple Hudson of pulmonology, anemia, history of breast cancer, GERD, who presents with complaints of productive cough.  Symptoms initially started about 9 days ago with complaints of cough.  Sinus congestion and sneezing.  She initially thought symptoms were related to her allergies.  Associated symptoms included sore throat, wheezing, loose stools, hot/cold flashes, and reports weight loss of approximately 5 pounds over last few months.    5 days ago she started having low-grade fevers and reported feeling more short of breath with any kind of movement or activity.  Patient denied having any chest pain or palpitations, or leg swelling. She does make note that she has been caring for her husband who had been sick and passed away approximately 2 weeks ago.  She makes note that she had a prior 50 smoking pack-year history, but quit over 20 years ago.   4/24: Respiratory viral panel positive for RSV.  Cardiology consult appreciated.  Unclear nature of atrial tachyarrhythmia.  MAT versus atrial fibrillation.  Anticoagulation not indicated per cardiology.   Assessment & Plan:   Principal Problem:   New onset atrial fibrillation Active Problems:   COPD (chronic obstructive pulmonary disease) with acute bronchitis   Hypokalemia   History of cardiomyopathy   Anxiety,depression, insomnia   BREAST CANCER, HX OF   Tachycardia  New onset atrial tachyarrhythmia Exact etiology of arrhythmia is unclear.  Atrial fibrillation versus MAT.  Episode short.  Cardiology consulted.  Patient not meeting criteria for anticoagulation at this time. Plan: Telemetry monitoring Check 2D echocardiogram Defer anticoagulation at this time Cardiology follow-up Would likely need event  monitor at time of discharge   COPD with acute bronchitis RSV infection Acute hypoxic respiratory failure Acute. Patient reports having symptoms of cough, congestion, wheezing, and low-grade fevers over the last week.  No wheezing currently appreciated on physical exam but decreased overall aeration.  Chest x-ray noted hyperinflation without acute abnormality.  COVID-19 screening was negative.  Patient requiring 3 L via nasal cannula Plan: Prednisone 20 mg daily Scheduled Xopenex Albuterol as needed I-S and flutter valve use Wean oxygen as tolerated   Hypokalemia Acute.  Initial potassium 2.8.  Received replacement.  Now recovered to 4.1.  Maintain potassium greater than 4.  Daily labs.     History of Cardiomyopathy Previously thought secondary to Herceptin with EF noted to be as low as 34% in 2009/2010 and subsequent recoveryin 2011 to 55-60%. No recent echo to compare.  Repeat 2D echocardiogram ordered   Anxiety and depression -Continue Celexa and hydroxyzine as needed   History of breast cancer Patient previously diagnosed with left-sided breast cancer back in 11/2007.   She underwent bilateral mastectomies.  Patient was treated with Taxol and Herceptin chemotherapy.  Complicated grief Patient unfortunately lost her husband 2 weeks prior to presentation.  It is possible that arrhythmia may be related to complicated grief.  Will involve chaplain  Underweight BMI 15.62.  Suspect protein calorie malnutrition.  Will involve dietitian.  DVT prophylaxis: SQ Lovenox Code Status: Full Family Communication: None Disposition Plan: Status is: Observation The patient will require care spanning > 2 midnights and should be moved to inpatient because: Acute hypoxic respiratory failure, decompensated COPD, new atrial tachyarrhythmia   Level of care: Telemetry Cardiac  Consultants:  Cardiology  Procedures:  None  Antimicrobials: None   Subjective: Seen and examined.  Resting  comfortably in bed.  Appears frail but otherwise asymptomatic.  Objective: Vitals:   12/23/22 0700 12/23/22 0800 12/23/22 0900 12/23/22 1000  BP: 111/70 120/73 122/77 111/64  Pulse: 78 82 87 (!) 101  Resp: 13 12 13 16   Temp:      TempSrc:      SpO2: 94% 97% 93% 94%    Intake/Output Summary (Last 24 hours) at 12/23/2022 1146 Last data filed at 12/22/2022 1454 Gross per 24 hour  Intake 1012.14 ml  Output --  Net 1012.14 ml   There were no vitals filed for this visit.  Examination:  General exam: NAD.  Appears frail Respiratory system: Decreased breath sounds bilaterally.  Mild end expiratory wheeze.  Mild scattered crackles.  Normal work of breathing.  3 L Cardiovascular system: S1-S2, RRR, no murmurs, no pedal edema Gastrointestinal system: Thin, soft, NT/ND, normal bowel sounds Central nervous system: Alert and oriented. No focal neurological deficits. Extremities: Symmetric 5 x 5 power. Skin: No rashes, lesions or ulcers Psychiatry: Judgement and insight appear normal. Mood & affect appropriate.     Data Reviewed: I have personally reviewed following labs and imaging studies  CBC: Recent Labs  Lab 12/22/22 1300 12/23/22 0147  WBC 11.6* 7.1  HGB 14.5 12.8  HCT 43.5 36.6  MCV 91.4 89.1  PLT 265 243   Basic Metabolic Panel: Recent Labs  Lab 12/22/22 1300 12/23/22 0147  NA 134* 133*  K 2.8* 4.1  CL 97* 101  CO2 21* 24  GLUCOSE 144* 196*  BUN 21 16  CREATININE 0.94 0.69  CALCIUM 9.4 8.8*  MG  --  1.9   GFR: Estimated Creatinine Clearance: 36.4 mL/min (by C-G formula based on SCr of 0.69 mg/dL). Liver Function Tests: No results for input(s): "AST", "ALT", "ALKPHOS", "BILITOT", "PROT", "ALBUMIN" in the last 168 hours. No results for input(s): "LIPASE", "AMYLASE" in the last 168 hours. No results for input(s): "AMMONIA" in the last 168 hours. Coagulation Profile: No results for input(s): "INR", "PROTIME" in the last 168 hours. Cardiac Enzymes: No results  for input(s): "CKTOTAL", "CKMB", "CKMBINDEX", "TROPONINI" in the last 168 hours. BNP (last 3 results) No results for input(s): "PROBNP" in the last 8760 hours. HbA1C: No results for input(s): "HGBA1C" in the last 72 hours. CBG: No results for input(s): "GLUCAP" in the last 168 hours. Lipid Profile: No results for input(s): "CHOL", "HDL", "LDLCALC", "TRIG", "CHOLHDL", "LDLDIRECT" in the last 72 hours. Thyroid Function Tests: Recent Labs    12/22/22 1300  TSH 3.375   Anemia Panel: No results for input(s): "VITAMINB12", "FOLATE", "FERRITIN", "TIBC", "IRON", "RETICCTPCT" in the last 72 hours. Sepsis Labs: Recent Labs  Lab 12/22/22 1308 12/22/22 1535  PROCALCITON  --  <0.10  LATICACIDVEN 2.2*  --     Recent Results (from the past 240 hour(s))  SARS Coronavirus 2 by RT PCR (hospital order, performed in [facility] hospital lab) *cepheid single result test* Anterior Nasal Swab     Status: None   Collection Time: 12/22/22  1:01 PM   Specimen: Anterior Nasal Swab  Result Value Ref Range Status   SARS Coronavirus 2 by RT PCR NEGATIVE NEGATIVE Final    Comment: Performed at [facility], [address], [place], [place] [address]  Blood culture (routine x 2)     Status: None (Preliminary result)   Collection Time: 12/22/22  1:08 PM   Specimen: BLOOD LEFT ARM  Result Value Ref Range Status   Specimen Description BLOOD LEFT ARM  Final   Special Requests   Final    BOTTLES DRAWN AEROBIC AND ANAEROBIC Blood Culture results may not be optimal due to an inadequate volume of blood received in culture bottles   Culture   Final    NO GROWTH < 24 HOURS Performed at [facility], [address] [place], [place] [address]    Report Status PENDING  Incomplete  Blood culture (routine x 2)     Status: None (Preliminary result)   Collection Time: 12/22/22  1:13 PM   Specimen: BLOOD RIGHT ARM  Result Value Ref Range Status   Specimen Description BLOOD RIGHT ARM  Final   Special  Requests   Final    BOTTLES DRAWN AEROBIC AND ANAEROBIC Blood Culture results may not be optimal due to an inadequate volume of blood received in culture bottles   Culture   Final    NO GROWTH < 24 HOURS Performed at [facility], [address], [place], [place] [address]    Report Status PENDING  Incomplete  Respiratory (~20 pathogens) panel by PCR     Status: Abnormal   Collection Time: 12/22/22  5:12 PM   Specimen: Nasopharyngeal Swab; Respiratory  Result Value Ref Range Status   Adenovirus NOT DETECTED NOT DETECTED Corrected   Coronavirus 229E NOT DETECTED NOT DETECTED Corrected    Comment: (NOTE) The Coronavirus on the Respiratory Panel, DOES NOT test for the novel  Coronavirus (2019 nCoV) CORRECTED ON 04/23 AT 2239: PREVIOUSLY REPORTED AS NOT DETECTED    Coronavirus HKU1 NOT DETECTED NOT DETECTED Corrected   Coronavirus NL63 NOT DETECTED NOT DETECTED Corrected   Coronavirus OC43 NOT DETECTED NOT DETECTED Corrected   Metapneumovirus NOT DETECTED NOT DETECTED Corrected   Rhinovirus / Enterovirus NOT DETECTED NOT DETECTED Corrected   Influenza A NOT DETECTED NOT DETECTED Corrected   Influenza B NOT DETECTED NOT DETECTED Corrected   Parainfluenza Virus 1 NOT DETECTED NOT DETECTED Corrected   Parainfluenza Virus 2 NOT DETECTED NOT DETECTED Corrected   Parainfluenza Virus 3 NOT DETECTED NOT DETECTED Corrected   Parainfluenza Virus 4 NOT DETECTED NOT DETECTED Corrected   Respiratory Syncytial Virus DETECTED (A) NOT DETECTED Corrected   Bordetella pertussis NOT DETECTED NOT DETECTED Corrected   Bordetella Parapertussis NOT DETECTED NOT DETECTED Corrected   Chlamydophila pneumoniae NOT DETECTED NOT DETECTED Corrected   Mycoplasma pneumoniae NOT DETECTED NOT DETECTED Corrected    Comment: Performed at [facility], [address], [place], [place] [address]         Radiology Studies: DG Chest Port 1 View  Result Date: 12/22/2022 CLINICAL DATA:  Cough EXAM:  PORTABLE CHEST 1 VIEW COMPARISON:  04/21/2022 FINDINGS: Hyperinflation. No consolidation, pneumothorax, effusion or edema. Normal cardiopericardial silhouette. Curvature of the spine. Surgical changes overlying the left hemithorax. Fixation hardware along the lower cervical spine. Overlapping cardiac leads. IMPRESSION: Hyperinflation.  No acute cardiopulmonary disease Electronically Signed   By: Karen Kays M.D.   On: 12/22/2022 13:29        Scheduled Meds:  citalopram  40 mg Oral Daily   enoxaparin (LOVENOX) injection  20 mg Subcutaneous Q24H   guaiFENesin  600 mg Oral BID   levalbuterol  0.63 mg Nebulization Q8H   loratadine  10 mg Oral Daily   predniSONE  20 mg Oral Q breakfast   sodium chloride flush  3 mL Intravenous Q12H   umeclidinium bromide  1 puff Inhalation Daily   Continuous Infusions:   LOS: 0 days     Tresa Moore, MD [facility]   If 7PM-7AM, please contact night-coverage  12/23/2022, 11:46 AM

## 2022-12-23 NOTE — ED Notes (Signed)
Pt tx to vascular and then will be going to her room 6E room 6.

## 2022-12-23 NOTE — Progress Notes (Signed)
Rounding Note    Patient Name: [name] Date of Encounter: 12/23/2022  [facility] Cardiologist: None   Subjective   Reports dyspnea improving, denies any chest chest pain.  Reports palpitations have resolved  Inpatient Medications    Scheduled Meds:  albuterol  2.5 mg Nebulization QID   citalopram  40 mg Oral Daily   enoxaparin (LOVENOX) injection  20 mg Subcutaneous Q24H   guaiFENesin  600 mg Oral BID   loratadine  10 mg Oral Daily   sodium chloride flush  3 mL Intravenous Q12H   umeclidinium bromide  1 puff Inhalation Daily   Continuous Infusions:  diltiazem (CARDIZEM) infusion 7.5 mg/hr (12/23/22 0441)   PRN Meds: hydrOXYzine, methocarbamol   Vital Signs    Vitals:   12/23/22 0138 12/23/22 0139 12/23/22 0505 12/23/22 0700  BP: 99/80  112/68 111/70  Pulse: 72  78 78  Resp: Temp: 98.6 F (37 C)  98.6 F (37 C)   TempSrc: Oral  Oral   SpO2: 91%  93% 94%    Intake/Output Summary (Last 24 hours) at 12/23/2022 0815 Last data filed at 12/22/2022 1454 Gross per 24 hour  Intake 1012.14 ml  Output --  Net 1012.14 ml      12/22/2022   11:26 AM 07/29/2022    2:37 PM 06/29/2022    2:26 PM  Last 3 Weights  Weight (lbs) 80 lb 0.1 oz 84 lb 76 lb  Weight (kg) 36.29 kg 38.102 kg 34.473 kg      Telemetry    Sinus rhythm, no further episodes of tachycardia- Personally Reviewed  ECG    No new ECG - Personally Reviewed  Physical Exam   GEN: No acute distress.   Neck: No JVD Cardiac: RRR, no murmurs Respiratory: Diminished breath sounds GI: Soft, nontende MS: No edema; No deformity. Neuro:  Nonfocal  Psych: Normal affect   Labs    High Sensitivity Troponin:   Recent Labs  Lab 12/22/22 1300 12/22/22 1535  TROPONINIHS 6 5     Chemistry Recent Labs  Lab 12/22/22 1300 12/23/22 0147  NA 134* 133*  K 2.8* 4.1  CL 97* 101  CO2 21* 24  GLUCOSE 144* 196*  BUN 21 16  CREATININE 0.94 0.69  CALCIUM 9.4 8.8*  MG  --   1.9  GFRNONAA >60 >60  ANIONGAP 16* 8    Lipids No results for input(s): "CHOL", "TRIG", "HDL", "LABVLDL", "LDLCALC", "CHOLHDL" in the last 168 hours.  Hematology Recent Labs  Lab 12/22/22 1300 12/23/22 0147  WBC 11.6* 7.1  RBC 4.76 4.11  HGB 14.5 12.8  HCT 43.5 36.6  MCV 91.4 89.1  MCH 30.5 31.1  MCHC 33.3 35.0  RDW 12.9 12.8  PLT 265 243   Thyroid  Recent Labs  Lab 12/22/22 1300  TSH 3.375    BNP Recent Labs  Lab 12/22/22 1300  BNP 49.0    DDimer No results for input(s): "DDIMER" in the last 168 hours.   Radiology    DG Chest Port 1 View  Result Date: 12/22/2022 CLINICAL DATA:  Cough EXAM: PORTABLE CHEST 1 VIEW COMPARISON:  04/21/2022 FINDINGS: Hyperinflation. No consolidation, pneumothorax, effusion or edema. Normal cardiopericardial silhouette. Curvature of the spine. Surgical changes overlying the left hemithorax. Fixation hardware along the lower cervical spine. Overlapping cardiac leads. IMPRESSION: Hyperinflation.  No acute cardiopulmonary disease Electronically Signed   By: Karen Kays M.D.   On: 12/22/2022 13:29    Cardiac  Studies     Patient Profile     72 y.o. female with COPD who is seen for evaluation of tachycardia  Assessment & Plan    Tachycardia: On admission was having runs of MAT versus A-fib.  Given short duration less than 6 minutes, anticoagulation would not be indicated even if it was A-fib.   -Check echocardiogram -Suspect episodes were driven by hypokalemia and respiratory issues.  Since improved, no further episodes.  Would stop diltiazem gtt and monitor for recurrence.  -Plan cardiac monitor at discharge for further evaluation  Cardiomyopathy: History of systolic dysfunction with EF as low as 34%, thought to be secondary to Herceptin use for breast cancer.  Subsequently had recovery with EF 55 to 60% in 2011 -Check echocardiogram  COPD exacerbation: Treatment per primary team.  Respiratory viral panel shows RSV  positive  Hypokalemia: Suspect contributing to heart rhythm abnormalities, initial potassium 2.8.  Potassium 4.1 this morning  For questions or updates, please contact [place] [facility] Please consult [email or web address] for contact info under        Signed, Little Ishikawa, MD  12/23/2022, 8:15 AM

## 2022-12-23 NOTE — Progress Notes (Signed)
Try to visit with Patient. Pt sleeping.  I Spoke with patient nurse.She said she  would let patient know that a Chaplain came to visit with her. Chaplain available as needed.  [name], [place], [facility], Pager [phone]

## 2022-12-23 NOTE — Plan of Care (Signed)
  Problem: Clinical Measurements: Goal: Will remain free from infection Outcome: Progressing Goal: Diagnostic test results will improve Outcome: Progressing Goal: Respiratory complications will improve Outcome: Progressing Goal: Cardiovascular complication will be avoided Outcome: Progressing   

## 2022-12-23 NOTE — Care Management Obs Status (Signed)
MEDICARE OBSERVATION STATUS NOTIFICATION   Patient Details  Name: [name] MRN: [id number] Date of Birth: 1951-02-28   Medicare Observation Status Notification Given:  Yes    Lawerance Sabal, RN 12/23/2022, 2:21 PM

## 2022-12-24 DIAGNOSIS — Z681 Body mass index (BMI) 19 or less, adult: Secondary | ICD-10-CM | POA: Diagnosis not present

## 2022-12-24 DIAGNOSIS — Z9013 Acquired absence of bilateral breasts and nipples: Secondary | ICD-10-CM | POA: Diagnosis not present

## 2022-12-24 DIAGNOSIS — Z8679 Personal history of other diseases of the circulatory system: Secondary | ICD-10-CM | POA: Diagnosis not present

## 2022-12-24 DIAGNOSIS — F419 Anxiety disorder, unspecified: Secondary | ICD-10-CM | POA: Diagnosis present

## 2022-12-24 DIAGNOSIS — J440 Chronic obstructive pulmonary disease with acute lower respiratory infection: Secondary | ICD-10-CM | POA: Diagnosis not present

## 2022-12-24 DIAGNOSIS — E876 Hypokalemia: Secondary | ICD-10-CM | POA: Diagnosis present

## 2022-12-24 DIAGNOSIS — F32A Depression, unspecified: Secondary | ICD-10-CM | POA: Diagnosis present

## 2022-12-24 DIAGNOSIS — Z1152 Encounter for screening for COVID-19: Secondary | ICD-10-CM | POA: Diagnosis not present

## 2022-12-24 DIAGNOSIS — J205 Acute bronchitis due to respiratory syncytial virus: Secondary | ICD-10-CM | POA: Diagnosis present

## 2022-12-24 DIAGNOSIS — G43909 Migraine, unspecified, not intractable, without status migrainosus: Secondary | ICD-10-CM | POA: Diagnosis present

## 2022-12-24 DIAGNOSIS — F4381 Prolonged grief disorder: Secondary | ICD-10-CM | POA: Diagnosis present

## 2022-12-24 DIAGNOSIS — Z79899 Other long term (current) drug therapy: Secondary | ICD-10-CM | POA: Diagnosis not present

## 2022-12-24 DIAGNOSIS — Z87891 Personal history of nicotine dependence: Secondary | ICD-10-CM | POA: Diagnosis not present

## 2022-12-24 DIAGNOSIS — I428 Other cardiomyopathies: Secondary | ICD-10-CM | POA: Diagnosis present

## 2022-12-24 DIAGNOSIS — I4891 Unspecified atrial fibrillation: Secondary | ICD-10-CM | POA: Diagnosis present

## 2022-12-24 DIAGNOSIS — M797 Fibromyalgia: Secondary | ICD-10-CM | POA: Diagnosis present

## 2022-12-24 DIAGNOSIS — R000 Tachycardia, unspecified: Secondary | ICD-10-CM | POA: Diagnosis not present

## 2022-12-24 DIAGNOSIS — I4719 Other supraventricular tachycardia: Secondary | ICD-10-CM | POA: Diagnosis present

## 2022-12-24 DIAGNOSIS — J209 Acute bronchitis, unspecified: Secondary | ICD-10-CM | POA: Diagnosis not present

## 2022-12-24 DIAGNOSIS — E785 Hyperlipidemia, unspecified: Secondary | ICD-10-CM | POA: Diagnosis present

## 2022-12-24 DIAGNOSIS — E43 Unspecified severe protein-calorie malnutrition: Secondary | ICD-10-CM | POA: Diagnosis present

## 2022-12-24 DIAGNOSIS — R54 Age-related physical debility: Secondary | ICD-10-CM | POA: Diagnosis present

## 2022-12-24 DIAGNOSIS — J9601 Acute respiratory failure with hypoxia: Secondary | ICD-10-CM | POA: Diagnosis present

## 2022-12-24 DIAGNOSIS — R634 Abnormal weight loss: Secondary | ICD-10-CM | POA: Diagnosis present

## 2022-12-24 DIAGNOSIS — J208 Acute bronchitis due to other specified organisms: Secondary | ICD-10-CM | POA: Diagnosis not present

## 2022-12-24 DIAGNOSIS — J441 Chronic obstructive pulmonary disease with (acute) exacerbation: Secondary | ICD-10-CM | POA: Diagnosis present

## 2022-12-24 DIAGNOSIS — Z853 Personal history of malignant neoplasm of breast: Secondary | ICD-10-CM | POA: Diagnosis not present

## 2022-12-24 DIAGNOSIS — G4700 Insomnia, unspecified: Secondary | ICD-10-CM | POA: Diagnosis present

## 2022-12-24 LAB — BASIC METABOLIC PANEL
Anion gap: 11 (ref 5–15)
BUN: 24 mg/dL — ABNORMAL HIGH (ref 8–23)
CO2: 25 mmol/L (ref 22–32)
Calcium: 8.9 mg/dL (ref 8.9–10.3)
Chloride: 101 mmol/L (ref 98–111)
Creatinine, Ser: 0.79 mg/dL (ref 0.44–1.00)
GFR, Estimated: >60 mL/min (ref >60)
Glucose, Bld: 119 mg/dL — ABNORMAL HIGH (ref 70–99)
Potassium: 3.6 mmol/L (ref 3.5–5.1)
Sodium: 137 mmol/L (ref 135–145)

## 2022-12-24 LAB — CULTURE, BLOOD (ROUTINE X 2)

## 2022-12-24 MED ORDER — DILTIAZEM HCL ER COATED BEADS 120 MG PO CP24
120.0000 mg | ORAL_CAPSULE | Freq: Every day | ORAL | Status: DC
  Administered 2022-12-24 – 2022-12-25 (×2): 120 mg via ORAL
  Filled 2022-12-24 (×2): qty 1

## 2022-12-24 MED ORDER — LEVALBUTEROL HCL 0.63 MG/3ML IN NEBU: 0.6300 mg | INHALATION_SOLUTION | Freq: Four times a day (QID) | RESPIRATORY_TRACT | Status: DC | PRN

## 2022-12-24 NOTE — Care Management (Signed)
  Transition of Care [facility]) Screening Note   Patient Details  Name: [name] Date of Birth: Nov 23, 1950   [facility] of [facility]) CM/SW Contact:    Gala Lewandowsky, RN Phone Number: 12/24/2022, 3:41 PM    Transition of Care Department [facility]) has reviewed the patient and no TOC needs have been identified at this time. Patient presented for Atrial Fib. Patient has insurance and PCP. We will continue to monitor patient advancement through interdisciplinary progression rounds. If new patient transition needs arise, please place a TOC consult.

## 2022-12-24 NOTE — Progress Notes (Signed)
SATURATION QUALIFICATIONS: (This note is used to comply with regulatory documentation for home oxygen)  Patient Saturations on Room Air at Rest = 89%  Patient Saturations on Room Air while Ambulating = 86%  Patient Saturations on 2 Liters of oxygen while Ambulating = 93%  Please briefly explain why patient needs home oxygen:  Pt qualifies for home O2 with the decrease in O2 saturation. Pt also showed signs of labored breathing and was SOB

## 2022-12-24 NOTE — Hospital Course (Signed)
72 y.o. female with medical history significant of hyperlipidemia, COPD mixed type followed by Dr. Maple Hudson of pulmonology, anemia, history of breast cancer, GERD, who presents with complaints of productive cough.  Symptoms initially started about 9 days ago with complaints of cough.  Sinus congestion and sneezing.  She initially thought symptoms were related to her allergies.  Associated symptoms included sore throat, wheezing, loose stools, hot/cold flashes, and reports weight loss of approximately 5 pounds over last few months.    5 days ago she started having low-grade fevers and reported feeling more short of breath with any kind of movement or activity.  Patient denied having any chest pain or palpitations, or leg swelling. She does make note that she has been caring for her husband who had been sick and passed away approximately 2 weeks ago.  She makes note that she had a prior 50 smoking pack-year history, but quit over 20 years ago.    4/24: Respiratory viral panel positive for RSV.  Cardiology consult appreciated.  Unclear nature of atrial tachyarrhythmia.  MAT versus atrial fibrillation.  Anticoagulation not indicated per cardiology.

## 2022-12-24 NOTE — Progress Notes (Signed)
Progress Note    [name]   ZOX:[id number]  DOB: 1950-10-23  DOA: 12/22/2022     0 PCP: Mayer Masker, PA-C (Inactive)  Initial CC: cough  Hospital Course: 72 y.o. female with medical history significant of hyperlipidemia, COPD mixed type followed by Dr. Maple Hudson of pulmonology, anemia, history of breast cancer, GERD, who presents with complaints of productive cough.  Symptoms initially started about 9 days ago with complaints of cough.  Sinus congestion and sneezing.  She initially thought symptoms were related to her allergies.  Associated symptoms included sore throat, wheezing, loose stools, hot/cold flashes, and reports weight loss of approximately 5 pounds over last few months.    5 days ago she started having low-grade fevers and reported feeling more short of breath with any kind of movement or activity.  Patient denied having any chest pain or palpitations, or leg swelling. She does make note that she has been caring for her husband who had been sick and passed away approximately 2 weeks ago.  She makes note that she had a prior 50 smoking pack-year history, but quit over 20 years ago.    4/24: Respiratory viral panel positive for RSV.  Cardiology consult appreciated.  Unclear nature of atrial tachyarrhythmia.  MAT versus atrial fibrillation.  Anticoagulation not indicated per cardiology.  Interval History:  No events overnight.  Resting in bed comfortably seen this morning.  Minimal cough and breathing comfortably.  Denies any chest pain or shortness of breath.  Assessment and Plan:  New onset atrial tachyarrhythmia Exact etiology of arrhythmia is unclear.  Atrial fibrillation versus MAT.  Episode short.  Cardiology consulted.  Patient not meeting criteria for anticoagulation at this time. Telemetry monitoring Defer anticoagulation at this time Cardiology follow-up Would likely need event monitor at time of discharge -Started on Cardizem per cardiology   COPD with  acute bronchitis RSV infection Acute hypoxic respiratory failure Acute. Patient reports having symptoms of cough, congestion, wheezing, and low-grade fevers over the last week.  No wheezing currently appreciated on physical exam but decreased overall aeration.  Chest x-ray noted hyperinflation without acute abnormality.  COVID-19 screening was negative.   Prednisone  Albuterol as needed I-S and flutter valve use Wean oxygen as tolerated; okay on room air but desaturated with ambulation   Hypokalemia Acute.  Initial potassium 2.8.  Received replacement.  Now recovered to 4.1.  Maintain potassium greater than 4.  Daily labs.     History of Cardiomyopathy Previously thought secondary to Herceptin with EF noted to be as low as 34% in 2009/2010 and subsequent recoveryin 2011 to 55-60%. No recent echo to compare.  Repeat 2D echocardiogram ordered   Anxiety and depression -Continue Celexa and hydroxyzine as needed   History of breast cancer Patient previously diagnosed with left-sided breast cancer back in 11/2007.   She underwent bilateral mastectomies.  Patient was treated with Taxol and Herceptin chemotherapy.   Complicated grief Patient unfortunately lost her husband 2 weeks prior to presentation.  It is possible that arrhythmia may be related to complicated grief.  Will involve chaplain   Underweight BMI 15.62.  Suspect protein calorie malnutrition.   Old records reviewed in assessment of this patient  Antimicrobials:   DVT prophylaxis:     Code Status:   Code Status: Full Code  Mobility Assessment (last 72 hours)     Mobility Assessment     Row Name 12/24/22 0900 12/23/22 2031 12/23/22 1423 12/23/22 1422 12/22/22 2300   Does patient have an  order for bedrest or is patient medically unstable No - Continue assessment No - Continue assessment No - Continue assessment No - Continue assessment No - Continue assessment   What is the highest level of mobility based on the  progressive mobility assessment? Level 5 (Walks with assist in room/hall) - Balance while stepping forward/back and can walk in room with assist - Complete Level 5 (Walks with assist in room/hall) - Balance while stepping forward/back and can walk in room with assist - Complete Level 5 (Walks with assist in room/hall) - Balance while stepping forward/back and can walk in room with assist - Complete Level 5 (Walks with assist in room/hall) - Balance while stepping forward/back and can walk in room with assist - Complete Level 5 (Walks with assist in room/hall) - Balance while stepping forward/back and can walk in room with assist - Complete            Barriers to discharge:  Disposition Plan:  Home Status is: Inpt  Objective: Blood pressure 122/84, pulse 78, temperature 98.8 F (37.1 C), temperature source Oral, resp. rate 18, SpO2 98 %.  Examination:  Physical Exam Constitutional:      Appearance: Normal appearance.  HENT:     Head: Normocephalic and atraumatic.     Mouth/Throat:     Mouth: Mucous membranes are moist.  Eyes:     Extraocular Movements: Extraocular movements intact.  Cardiovascular:     Rate and Rhythm: Normal rate.  Pulmonary:     Effort: Pulmonary effort is normal.     Breath sounds: Normal breath sounds.  Abdominal:     General: Bowel sounds are normal. There is no distension.     Palpations: Abdomen is soft.     Tenderness: There is no abdominal tenderness.  Musculoskeletal:        General: Normal range of motion.     Cervical back: Normal range of motion and neck supple.  Skin:    General: Skin is warm and dry.  Neurological:     General: No focal deficit present.     Mental Status: She is alert.  Psychiatric:        Mood and Affect: Mood normal.        Behavior: Behavior normal.      Consultants:  Cardiology  Procedures:    Data Reviewed: Results for orders placed or performed during the hospital encounter of 12/22/22 (from the past 24  hour(s))  Basic metabolic panel     Status: Abnormal   Collection Time: 12/24/22  3:14 AM  Result Value Ref Range   Sodium 137 135 - 145 mmol/L   Potassium 3.6 3.5 - 5.1 mmol/L   Chloride 101 98 - 111 mmol/L   CO2 25 22 - 32 mmol/L   Glucose, Bld 119 (H) 70 - 99 mg/dL   BUN 24 (H) 8 - 23 mg/dL   Creatinine, Ser 1.61 0.44 - 1.00 mg/dL   Calcium 8.9 8.9 - 09.6 mg/dL   GFR, Estimated >04 >54 mL/min   Anion gap 11 5 - 15    I have reviewed pertinent nursing notes, vitals, labs, and images as necessary. I have ordered labwork to follow up on as indicated.  I have reviewed the last notes from staff over past 24 hours. I have discussed patient's care plan and test results with nursing staff, CM/SW, and other staff as appropriate.  Time spent: Greater than 50% of the 55 minute visit was spent in counseling/coordination of care for  the patient as laid out in the A&P.   LOS: 0 days   Lewie Chamber, MD [facility] 12/24/2022, 5:01 PM

## 2022-12-24 NOTE — Progress Notes (Signed)
Per secure chat update, not discharging today therefore we'll hold off on placing the Zio order today. Will need reassessment tomorrow to determine when she is going home in order to finalize plans for monitor. Watch on tele overnight in the interim. Tentatively arranged 1 mo f/u and put on AVS.

## 2022-12-24 NOTE — Progress Notes (Addendum)
Progress Note  Patient Name: [name] Date of Encounter: 12/24/2022  Primary Cardiologist: Rollene Rotunda, MD  Subjective   Feeling much better, breathing improved. Brief narrow complex tachycardia (19 beats) noted around 8:30am, no symptoms. Dr. Frederick Peers anticipates DC today pending our input.  Inpatient Medications    Scheduled Meds:  citalopram  40 mg Oral Daily   enoxaparin (LOVENOX) injection  20 mg Subcutaneous Q24H   guaiFENesin  600 mg Oral BID   levalbuterol  0.63 mg Nebulization Q8H   loratadine  10 mg Oral Daily   predniSONE  20 mg Oral Q breakfast   sodium chloride flush  3 mL Intravenous Q12H   umeclidinium bromide  1 puff Inhalation Daily   Continuous Infusions:  PRN Meds: albuterol, hydrOXYzine, methocarbamol   Vital Signs    Vitals:   12/23/22 2031 12/24/22 0016 12/24/22 0415 12/24/22 0822  BP: 116/69 114/75 129/85 123/81  Pulse: 92 81 78   Resp: Temp: 99.3 F (37.4 C) 99.2 F (37.3 C) 99.5 F (37.5 C) 98 F (36.7 C)  TempSrc: Oral Oral Oral Oral  SpO2: 96% 96% 98%     Intake/Output Summary (Last 24 hours) at 12/24/2022 0907 Last data filed at 12/24/2022 0416 Gross per 24 hour  Intake --  Output 300 ml  Net -300 ml      12/22/2022   11:26 AM 07/29/2022    2:37 PM 06/29/2022    2:26 PM  Last 3 Weights  Weight (lbs) 80 lb 0.1 oz 84 lb 76 lb  Weight (kg) 36.29 kg 38.102 kg 34.473 kg     Telemetry    NSR, brief narrow complex tachy 8:30am 19 beats - Personally Reviewed  Physical Exam   GEN: No acute distress. Cachectic appearing. HEENT: Normocephalic, atraumatic, sclera non-icteric. Neck: No JVD or bruits. Cardiac: RRR no murmurs, rubs, or gallops.  Respiratory: Diffusely diminished BS, coarse. No wheezing, rales or rhonchi. Breathing is unlabored. GI: Soft, nontender, non-distended, BS +x 4. MS: no deformity. Extremities: No clubbing or cyanosis. No edema. Distal pedal pulses are 2+ and equal  bilaterally. Neuro:  AAOx3. Follows commands. Psych:  Responds to questions appropriately with a normal affect.  Labs    High Sensitivity Troponin:   Recent Labs  Lab 12/22/22 1300 12/22/22 1535  TROPONINIHS 6 5      Cardiac EnzymesNo results for input(s): "TROPONINI" in the last 168 hours. No results for input(s): "TROPIPOC" in the last 168 hours.   Chemistry Recent Labs  Lab 12/22/22 1300 12/23/22 0147 12/24/22 0314  NA 134* 133* 137  K 2.8* 4.1 3.6  CL 97* 101 101  CO2 21* 24 25  GLUCOSE 144* 196* 119*  BUN 21 16 24*  CREATININE 0.94 0.69 0.79  CALCIUM 9.4 8.8* 8.9  GFRNONAA >60 >60 >60  ANIONGAP 16* 8 11     Hematology Recent Labs  Lab 12/22/22 1300 12/23/22 0147  WBC 11.6* 7.1  RBC 4.76 4.11  HGB 14.5 12.8  HCT 43.5 36.6  MCV 91.4 89.1  MCH 30.5 31.1  MCHC 33.3 35.0  RDW 12.9 12.8  PLT 265 243    BNP Recent Labs  Lab 12/22/22 1300  BNP 49.0     DDimer No results for input(s): "DDIMER" in the last 168 hours.   Radiology    ECHOCARDIOGRAM COMPLETE  Result Date: 12/23/2022    ECHOCARDIOGRAM REPORT   Patient Name:   [name] Date of Exam: 12/23/2022 Medical Rec #:  [id number]         Height:       60.0 in Accession #:    [id number]        Weight:       80.0 lb Date of Birth:  [birth year]        BSA:          1.265 m Patient Age:    72 years          BP:           111/64 mmHg Patient Gender: F                 HR:           88 bpm. Exam Location:  Inpatient Procedure: 2D Echo, Color Doppler and Cardiac Doppler Indications:    Afib in ER  History:        Patient has no prior history of Echocardiogram examinations.                 Cardiomyopathy, COPD, Arrythmias:Atrial Fibrillation; Risk                 Factors:Dyslipidemia.  Sonographer:    Milbert Coulter Referring Phys: [id number] RONDELL A SMITH  Sonographer Comments: Suboptimal apical window. Image acquisition challenging due to mastectomy and Image acquisition challenging due to breast implants.  IMPRESSIONS  1. Left ventricular ejection fraction, by estimation, is 60 to 65%. The left ventricle has normal function. The left ventricle has no regional wall motion abnormalities. Left ventricular diastolic parameters are consistent with Grade I diastolic dysfunction (impaired relaxation).  2. Right ventricular systolic function is normal. The right ventricular size is normal. There is normal pulmonary artery systolic pressure. The estimated right ventricular systolic pressure is 32.6 mmHg.  3. The mitral valve is normal in structure. Mild mitral valve regurgitation. No evidence of mitral stenosis.  4. The aortic valve is normal in structure. Aortic valve regurgitation is not visualized. No aortic stenosis is present.  5. Aortic dilatation noted. There is mild dilatation of the aortic root, measuring 40 mm.  6. The inferior vena cava is normal in size with greater than 50% respiratory variability, suggesting right atrial pressure of 3 mmHg. FINDINGS  Left Ventricle: Left ventricular ejection fraction, by estimation, is 60 to 65%. The left ventricle has normal function. The left ventricle has no regional wall motion abnormalities. The left ventricular internal cavity size was normal in size. There is  no left ventricular hypertrophy. Left ventricular diastolic parameters are consistent with Grade I diastolic dysfunction (impaired relaxation). Normal left ventricular filling pressure. Right Ventricle: The right ventricular size is normal. No increase in right ventricular wall thickness. Right ventricular systolic function is normal. There is normal pulmonary artery systolic pressure. The tricuspid regurgitant velocity is 2.72 m/s, and  with an assumed right atrial pressure of 3 mmHg, the estimated right ventricular systolic pressure is 32.6 mmHg. Left Atrium: Left atrial size was normal in size. Right Atrium: Right atrial size was normal in size. Pericardium: Trivial pericardial effusion is present. The pericardial  effusion is anterior to the right ventricle. Mitral Valve: The mitral valve is normal in structure. Mild mitral valve regurgitation. No evidence of mitral valve stenosis. Tricuspid Valve: The tricuspid valve is normal in structure. Tricuspid valve regurgitation is mild . No evidence of tricuspid stenosis. Aortic Valve: The aortic valve is normal in structure. Aortic valve regurgitation is not visualized. No aortic stenosis is present. Pulmonic Valve: The pulmonic valve was normal  in structure. Pulmonic valve regurgitation is not visualized. No evidence of pulmonic stenosis. Aorta: Aortic dilatation noted. There is mild dilatation of the aortic root, measuring 40 mm. Venous: The inferior vena cava is normal in size with greater than 50% respiratory variability, suggesting right atrial pressure of 3 mmHg. IAS/Shunts: No atrial level shunt detected by color flow Doppler.  LEFT VENTRICLE PLAX 2D LVIDd:         3.40 cm   Diastology LVIDs:         1.90 cm   LV e' medial:    8.03 cm/s LV PW:         0.90 cm   LV E/e' medial:  6.1 LV IVS:        0.80 cm   LV e' lateral:   10.30 cm/s LVOT diam:     1.50 cm   LV E/e' lateral: 4.7 LVOT Area:     1.77 cm  RIGHT VENTRICLE RV S prime:     19.70 cm/s TAPSE (M-mode): 2.0 cm LEFT ATRIUM         Index LA diam:    3.10 cm 2.45 cm/m   AORTA Ao Root diam: 4.00 cm MITRAL VALVE               TRICUSPID VALVE MV Area (PHT): 3.48 cm    TR Peak grad:   29.6 mmHg MV Decel Time: 218 msec    TR Vmax:        272.00 cm/s MV E velocity: 48.70 cm/s MV A velocity: 77.00 cm/s  SHUNTS MV E/A ratio:  0.63        Systemic Diam: 1.50 cm Armanda Magic MD Electronically signed by Armanda Magic MD Signature Date/Time: 12/23/2022/2:11:03 PM    Final    DG Chest Port 1 View  Result Date: 12/22/2022 CLINICAL DATA:  Cough EXAM: PORTABLE CHEST 1 VIEW COMPARISON:  04/21/2022 FINDINGS: Hyperinflation. No consolidation, pneumothorax, effusion or edema. Normal cardiopericardial silhouette. Curvature of the spine.  Surgical changes overlying the left hemithorax. Fixation hardware along the lower cervical spine. Overlapping cardiac leads. IMPRESSION: Hyperinflation.  No acute cardiopulmonary disease Electronically Signed   By: Karen Kays M.D.   On: 12/22/2022 13:29    Cardiac Studies   2D echo 12/23/22   1. Left ventricular ejection fraction, by estimation, is 60 to 65%. The  left ventricle has normal function. The left ventricle has no regional  wall motion abnormalities. Left ventricular diastolic parameters are  consistent with Grade I diastolic  dysfunction (impaired relaxation).   2. Right ventricular systolic function is normal. The right ventricular  size is normal. There is normal pulmonary artery systolic pressure. The  estimated right ventricular systolic pressure is 32.6 mmHg.   3. The mitral valve is normal in structure. Mild mitral valve  regurgitation. No evidence of mitral stenosis.   4. The aortic valve is normal in structure. Aortic valve regurgitation is  not visualized. No aortic stenosis is present.   5. Aortic dilatation noted. There is mild dilatation of the aortic root,  measuring 40 mm.   6. The inferior vena cava is normal in size with greater than 50%  respiratory variability, suggesting right atrial pressure of 3 mmHg.   Patient Profile     72 y.o. female with COPD, reported prior NICM in setting of Herceptin breast CA treatment with remote EF 34% by cMRI 11/2008 that subsequently normalized, anemia, B12 deficiency, fibromyalgia, GERD, HLD, IBS, migraines, osteoporosis, prior tobacco abuse. Recent increased stress  when husband died of brain tumor. Presented with increased SOB, cough, low grade fever. Admitted with acute hypoxic respiratory failure in setting of RSV with AECOPD, hypokalemia, underweight (36kg). Cardiology consulted for tachy-arrhythmia, suspected MAT vs AF.  Assessment & Plan    1. Tachycardia: On admission was having runs of MAT versus A-fib.  Given short  duration less than 6 minutes, anticoagulation would not be indicated even if it was A-fib.   Suspect driven by #3, #4. Did have brief recurrence of narrow complex tachycardia this AM - difficult to characterize, minor irregularity, not overtly AF. (? AT vs AFL). Will reivew with MD - start diltiazem 120 mg daily - echo reassuring - TSH wnl - will plan non-live monitor on discharge  2. Cardiomyopathy: History of systolic dysfunction with EF as low as 34% by cMRI 2010, thought to be secondary to Herceptin use for breast cancer.  Subsequently had recovery with EF 55 to 60% in 2011. - this admission, echo reassuring with EF 60-65%,  G1DD, mild MR, mild dilation of aortic root - consider repeat echo 1 year for dilation of aortic root, can be reviewed in follow-up  3. COPD exacerbation: Respiratory viral panel shows RSV positive. - per primary team   4. Hypokalemia: Suspect contributing to heart rhythm abnormalities, initial potassium 2.8, improved - lyte management per primary team  [facility] will sign off.   Medication Recommendations:  Diltiazem 120 mg daily Other recommendations (labs, testing, etc):  Zio patch x 2 weeks  Follow up as an outpatient:  Will schedule   For questions or updates, please contact [facility] Please consult [email or web address] for contact info under Cardiology/STEMI.  Signed, Laurann Montana, PA-C 12/24/2022, 9:07 AM     Patient seen and examined.  Agree with above documentation.  On exam, patient is alert and oriented, regular rate and rhythm, no murmurs, lungs CTAB, no LE edema or JVD.  Telemetry shows short run of tachyarrhythmia this morning, suspect likely atrial tachycardia.  Will start diltiazem 120 mg daily.  Plan Zio patch x 2 weeks on discharge.  Will schedule follow-up  Little Ishikawa, MD

## 2022-12-24 NOTE — Progress Notes (Signed)
Initial Nutrition Assessment  DOCUMENTATION CODES:   Severe malnutrition in context of chronic illness, Underweight  INTERVENTION:  Encourage po intake  Magic cup BID with meals, each supplement provides 290 kcal and 9 grams of protein Education on adequate nutrition  Education on increased nutrition needs at this time   NUTRITION DIAGNOSIS:   Severe Malnutrition related to chronic illness as evidenced by severe fat depletion, severe muscle depletion.   GOAL:   Patient will meet greater than or equal to 90% of their needs   MONITOR:   PO intake, Weight trends, Supplement acceptance, Skin, I & O's, Labs  REASON FOR ASSESSMENT:   Consult Assessment of nutrition requirement/status   ASSESSMENT:  72 y.o. female with PMHx including HLD, COPD mixed type, anemia, hx of breast cancer, GERD presents with c/o productive cough x 9days along with sore throat, wheezing, loose stools, hot/cold flashes, and wt loss 5# x1 month  +RSV  Husband passed x 2 weeks ago  Labs: Glu 119, BUN 24 Meds: deltasone, NS Wt: 5# wt loss x 1 month per patient PO: no meals documented at this time  I/O's:  +712 mL   Visited patient at bedside who reports improving appetite. Patient reports not eating well since 4/14. Patient developed nausea on this date and reports only eating soups.   At a baseline, patient eats 2 meals per day (lunch and dinner). She reports eating most of her eggs this morning although she tends to skip breakfast.    UBW 85#  She denies N/V/D/C, trouble chewing/swallowing. She does report trouble swallowing big pills and feeling like they get stuck sometimes so she eats something immediately after taking her vitamins. Patient forgot her bottom row of teeth at home and is gravitating towards softer textures.   Patient declines ONS such as boost and ensure, but will try magic cups.   NUTRITION - FOCUSED PHYSICAL EXAM:  Flowsheet Row Most Recent Value  Orbital Region Severe  depletion  Upper Arm Region Severe depletion  Thoracic and Lumbar Region Severe depletion  Buccal Region Severe depletion  Temple Region Severe depletion  Clavicle Bone Region Severe depletion  Clavicle and Acromion Bone Region Severe depletion  Scapular Bone Region Unable to assess  Dorsal Hand Severe depletion  Patellar Region Severe depletion  Anterior Thigh Region Severe depletion  Posterior Calf Region Severe depletion  Edema (RD Assessment) None  Hair Reviewed  Eyes Reviewed  Mouth Reviewed  Skin Reviewed  Nails Reviewed       Diet Order:   Diet Order             DIET SOFT Room service appropriate? Yes; Fluid consistency: Thin  Diet effective now                   EDUCATION NEEDS:   Education needs have been addressed  Skin:  Skin Assessment: Reviewed RN Assessment  Last BM:  4/24  Height:   Ht Readings from Last 1 Encounters:  12/22/22 5' (1.524 m)    Weight:   Wt Readings from Last 1 Encounters:  12/22/22 36.3 kg    Ideal Body Weight:  45.5 kg  BMI:  There is no height or weight on file to calculate BMI.  Estimated Nutritional Needs:   Kcal:  1200-1400  Protein:  45-55 g  Fluid:  >1.4 L    Leodis Rains, RDN, LDN  Clinical Nutrition

## 2022-12-25 ENCOUNTER — Inpatient Hospital Stay (INDEPENDENT_AMBULATORY_CARE_PROVIDER_SITE_OTHER): Payer: Medicare Other

## 2022-12-25 ENCOUNTER — Telehealth: Payer: Self-pay | Admitting: Physician Assistant

## 2022-12-25 ENCOUNTER — Other Ambulatory Visit (HOSPITAL_COMMUNITY): Payer: Self-pay

## 2022-12-25 DIAGNOSIS — J9601 Acute respiratory failure with hypoxia: Secondary | ICD-10-CM

## 2022-12-25 DIAGNOSIS — I4719 Other supraventricular tachycardia: Secondary | ICD-10-CM

## 2022-12-25 DIAGNOSIS — J205 Acute bronchitis due to respiratory syncytial virus: Secondary | ICD-10-CM

## 2022-12-25 DIAGNOSIS — J208 Acute bronchitis due to other specified organisms: Secondary | ICD-10-CM | POA: Diagnosis not present

## 2022-12-25 DIAGNOSIS — I4891 Unspecified atrial fibrillation: Secondary | ICD-10-CM | POA: Diagnosis not present

## 2022-12-25 LAB — CULTURE, BLOOD (ROUTINE X 2)

## 2022-12-25 MED ORDER — DILTIAZEM HCL ER COATED BEADS 120 MG PO CP24
120.0000 mg | ORAL_CAPSULE | Freq: Every day | ORAL | 3 refills | Status: DC
  Filled 2022-12-25: qty 30, 30d supply, fill #0

## 2022-12-25 MED ORDER — PREDNISONE 20 MG PO TABS
40.0000 mg | ORAL_TABLET | Freq: Every day | ORAL | 0 refills | Status: DC
  Filled 2022-12-25: qty 10, 5d supply, fill #0

## 2022-12-25 NOTE — Progress Notes (Signed)
Nurse requested Mobility Specialist to perform oxygen saturation test with pt which includes removing pt from oxygen both at rest and while ambulating.  Below are the results from that testing.     Patient Saturations on Room Air at Rest = spO2 89%  Patient Saturations on Room Air while Ambulating = sp02 77% .  Patient Saturations on 4 Liters of oxygen while Ambulating = sp02 89%  At end of testing pt left in room on 4  Liters of oxygen.  Reported results to nurse.  Addison Lank Mobility Specialist Please contact via SecureChat or  Rehab office at [phone]

## 2022-12-25 NOTE — Progress Notes (Addendum)
Progress Note  Patient Name: [name] Date of Encounter: 12/25/2022  Primary Cardiologist: Rollene Rotunda, MD  Subjective   Feeling much better. No complaints. Breathing better. Dr. Frederick Peers confirms likely d/c later today.   Inpatient Medications    Scheduled Meds:  citalopram  40 mg Oral Daily   diltiazem  120 mg Oral Daily   enoxaparin (LOVENOX) injection  20 mg Subcutaneous Q24H   guaiFENesin  600 mg Oral BID   loratadine  10 mg Oral Daily   predniSONE  20 mg Oral Q breakfast   sodium chloride flush  3 mL Intravenous Q12H   umeclidinium bromide  1 puff Inhalation Daily   Continuous Infusions:  PRN Meds: hydrOXYzine, levalbuterol, methocarbamol   Vital Signs    Vitals:   12/24/22 1614 12/24/22 2023 12/25/22 0025 12/25/22 0426  BP: 122/84 122/70 109/60 111/70  Pulse:  89  78  Resp: 18 20  18   Temp: 98.8 F (37.1 C) 98.9 F (37.2 C)  97.8 F (36.6 C)  TempSrc: Oral Oral  Oral  SpO2:  93%  91%   No intake or output data in the 24 hours ending 12/25/22 1031    12/22/2022   11:26 AM 07/29/2022    2:37 PM 06/29/2022    2:26 PM  Last 3 Weights  Weight (lbs) 80 lb 0.1 oz 84 lb 76 lb  Weight (kg) 36.29 kg 38.102 kg 34.473 kg     Telemetry    NSR with 3, then 4 beat atrial run yesterday, 9 beat run narrow complex tach this AM with mild widening/acceleration at end of tracing - Personally Reviewed  ECG    N/a - Personally Reviewed  Physical Exam   GEN: No acute distress. Cachectic appearing HEENT: Normocephalic, atraumatic, sclera non-icteric. Neck: No JVD or bruits. Cardiac: RRR no murmurs, rubs, or gallops.  Respiratory:  Diffusely diminished without wheezing, rales or rhonchi. Breathing is unlabored. GI: Soft, nontender, non-distended, BS +x 4. MS: no deformity. Extremities: No clubbing or cyanosis. No edema. Distal pedal pulses are 2+ and equal bilaterally. Neuro:  AAOx3. Follows commands. Psych:  Responds to questions appropriately with a  normal affect.  Labs    High Sensitivity Troponin:   Recent Labs  Lab 12/22/22 1300 12/22/22 1535  TROPONINIHS 6 5      Cardiac EnzymesNo results for input(s): "TROPONINI" in the last 168 hours. No results for input(s): "TROPIPOC" in the last 168 hours.   Chemistry Recent Labs  Lab 12/22/22 1300 12/23/22 0147 12/24/22 0314  NA 134* 133* 137  K 2.8* 4.1 3.6  CL 97* 101 101  CO2 21* 24 25  GLUCOSE 144* 196* 119*  BUN 21 16 24*  CREATININE 0.94 0.69 0.79  CALCIUM 9.4 8.8* 8.9  GFRNONAA >60 >60 >60  ANIONGAP 16* 8 11     Hematology Recent Labs  Lab 12/22/22 1300 12/23/22 0147  WBC 11.6* 7.1  RBC 4.76 4.11  HGB 14.5 12.8  HCT 43.5 36.6  MCV 91.4 89.1  MCH 30.5 31.1  MCHC 33.3 35.0  RDW 12.9 12.8  PLT 265 243    BNP Recent Labs  Lab 12/22/22 1300  BNP 49.0     DDimer No results for input(s): "DDIMER" in the last 168 hours.   Radiology    ECHOCARDIOGRAM COMPLETE  Result Date: 12/23/2022    ECHOCARDIOGRAM REPORT   Patient Name:   [name] Date of Exam: 12/23/2022 Medical Rec #:  [id number]  Height:       60.0 in Accession #:    [id number]        Weight:       80.0 lb Date of Birth:  1951-06-15        BSA:          1.265 m Patient Age:    71 years          BP:           111/64 mmHg Patient Gender: F                 HR:           88 bpm. Exam Location:  Inpatient Procedure: 2D Echo, Color Doppler and Cardiac Doppler Indications:    Afib in ER  History:        Patient has no prior history of Echocardiogram examinations.                 Cardiomyopathy, COPD, Arrythmias:Atrial Fibrillation; Risk                 Factors:Dyslipidemia.  Sonographer:    Milbert Coulter Referring Phys: [id number] RONDELL A SMITH  Sonographer Comments: Suboptimal apical window. Image acquisition challenging due to mastectomy and Image acquisition challenging due to breast implants. IMPRESSIONS  1. Left ventricular ejection fraction, by estimation, is 60 to 65%. The left ventricle  has normal function. The left ventricle has no regional wall motion abnormalities. Left ventricular diastolic parameters are consistent with Grade I diastolic dysfunction (impaired relaxation).  2. Right ventricular systolic function is normal. The right ventricular size is normal. There is normal pulmonary artery systolic pressure. The estimated right ventricular systolic pressure is 32.6 mmHg.  3. The mitral valve is normal in structure. Mild mitral valve regurgitation. No evidence of mitral stenosis.  4. The aortic valve is normal in structure. Aortic valve regurgitation is not visualized. No aortic stenosis is present.  5. Aortic dilatation noted. There is mild dilatation of the aortic root, measuring 40 mm.  6. The inferior vena cava is normal in size with greater than 50% respiratory variability, suggesting right atrial pressure of 3 mmHg. FINDINGS  Left Ventricle: Left ventricular ejection fraction, by estimation, is 60 to 65%. The left ventricle has normal function. The left ventricle has no regional wall motion abnormalities. The left ventricular internal cavity size was normal in size. There is  no left ventricular hypertrophy. Left ventricular diastolic parameters are consistent with Grade I diastolic dysfunction (impaired relaxation). Normal left ventricular filling pressure. Right Ventricle: The right ventricular size is normal. No increase in right ventricular wall thickness. Right ventricular systolic function is normal. There is normal pulmonary artery systolic pressure. The tricuspid regurgitant velocity is 2.72 m/s, and  with an assumed right atrial pressure of 3 mmHg, the estimated right ventricular systolic pressure is 32.6 mmHg. Left Atrium: Left atrial size was normal in size. Right Atrium: Right atrial size was normal in size. Pericardium: Trivial pericardial effusion is present. The pericardial effusion is anterior to the right ventricle. Mitral Valve: The mitral valve is normal in structure.  Mild mitral valve regurgitation. No evidence of mitral valve stenosis. Tricuspid Valve: The tricuspid valve is normal in structure. Tricuspid valve regurgitation is mild . No evidence of tricuspid stenosis. Aortic Valve: The aortic valve is normal in structure. Aortic valve regurgitation is not visualized. No aortic stenosis is present. Pulmonic Valve: The pulmonic valve was normal in structure. Pulmonic valve regurgitation is not visualized. No  evidence of pulmonic stenosis. Aorta: Aortic dilatation noted. There is mild dilatation of the aortic root, measuring 40 mm. Venous: The inferior vena cava is normal in size with greater than 50% respiratory variability, suggesting right atrial pressure of 3 mmHg. IAS/Shunts: No atrial level shunt detected by color flow Doppler.  LEFT VENTRICLE PLAX 2D LVIDd:         3.40 cm   Diastology LVIDs:         1.90 cm   LV e' medial:    8.03 cm/s LV PW:         0.90 cm   LV E/e' medial:  6.1 LV IVS:        0.80 cm   LV e' lateral:   10.30 cm/s LVOT diam:     1.50 cm   LV E/e' lateral: 4.7 LVOT Area:     1.77 cm  RIGHT VENTRICLE RV S prime:     19.70 cm/s TAPSE (M-mode): 2.0 cm LEFT ATRIUM         Index LA diam:    3.10 cm 2.45 cm/m   AORTA Ao Root diam: 4.00 cm MITRAL VALVE               TRICUSPID VALVE MV Area (PHT): 3.48 cm    TR Peak grad:   29.6 mmHg MV Decel Time: 218 msec    TR Vmax:        272.00 cm/s MV E velocity: 48.70 cm/s MV A velocity: 77.00 cm/s  SHUNTS MV E/A ratio:  0.63        Systemic Diam: 1.50 cm Armanda Magic MD Electronically signed by Armanda Magic MD Signature Date/Time: 12/23/2022/2:11:03 PM    Final     Cardiac Studies   2D echo 12/23/22   1. Left ventricular ejection fraction, by estimation, is 60 to 65%. The  left ventricle has normal function. The left ventricle has no regional  wall motion abnormalities. Left ventricular diastolic parameters are  consistent with Grade I diastolic  dysfunction (impaired relaxation).   2. Right ventricular  systolic function is normal. The right ventricular  size is normal. There is normal pulmonary artery systolic pressure. The  estimated right ventricular systolic pressure is 32.6 mmHg.   3. The mitral valve is normal in structure. Mild mitral valve  regurgitation. No evidence of mitral stenosis.   4. The aortic valve is normal in structure. Aortic valve regurgitation is  not visualized. No aortic stenosis is present.   5. Aortic dilatation noted. There is mild dilatation of the aortic root,  measuring 40 mm.   6. The inferior vena cava is normal in size with greater than 50%  respiratory variability, suggesting right atrial pressure of 3 mmHg.   Patient Profile     72 y.o. female with COPD, reported prior NICM in setting of Herceptin breast CA treatment with remote EF 34% by cMRI 11/2008 that subsequently normalized, anemia, B12 deficiency, fibromyalgia, GERD, HLD, IBS, migraines, osteoporosis, prior tobacco abuse. Recent increased stress when husband died of brain tumor. Presented with increased SOB, cough, low grade fever. Admitted with acute hypoxic respiratory failure in setting of RSV with AECOPD, hypokalemia, underweight (36kg). Cardiology consulted for tachy-arrhythmia, suspected MAT vs AF.   Assessment & Plan    1. Tachycardia: On admission was having runs of MAT versus A-fib.  Given short duration less than 6 minutes, anticoagulation would not be indicated even if it was A-fib.   Suspect driven by #3, #4. Since that time  has had very brief runs narrow complex tachycardia, not clearly fib/flutter, suspect A-tach, asymptomatic. - telemetry overnight showed prominantly NSR with 3, then 4 beat atrial run yesterday, with 9 beat run narrow complex tach this AM with mild widening/acceleration at end of tracing - will review with MD - started diltiazem 120 mg daily on 4/25, BP will not allow for more aggressive rx - echo reassuring - TSH wnl - msg sent to office to mail 14 day non live Zio to  house (patient aware) - has f/u 5/28 with APP in [place] office on AVS, also updated AVS to give reminder that monitor being mailed to home   2. Cardiomyopathy: History of systolic dysfunction with EF as low as 34% by cMRI 2010, thought to be secondary to Herceptin use for breast cancer.  Subsequently had recovery with EF 55 to 60% in 2011. - this admission, echo reassuring with EF 60-65%,  G1DD, mild MR, mild dilation of aortic root - consider repeat echo 1 year for dilation of aortic root, can be reviewed in follow-up   3. COPD exacerbation: Respiratory viral panel shows RSV positive. - per primary team - may require home O2 at DC due to desaturations   4. Hypokalemia: Suspect contributing to heart rhythm abnormalities, initial potassium 2.8, improved - lyte management per primary team - sodium improving as well   [facility] will sign off.   Medication Recommendations:  Diltiazem 120 mg daily Other recommendations (labs, testing, etc):  Zio patch x 2 weeks  Follow up as an outpatient:  Scheduled for 5/28    For questions or updates, please contact [facility] Please consult [email or web address] for contact info under Cardiology/STEMI.  Signed, Laurann Montana, PA-C 12/25/2022, 10:31 AM    Patient seen and examined.  Agree with above documentation.  On exam, patient is alert and oriented, regular rate and rhythm, no murmurs, lungs CTAB, no LE edema.  Brief run of tachycardia on telemetry.  Continue diltiazem.  Will plan Zio x 2 weeks on discharge.  Little Ishikawa, MD

## 2022-12-25 NOTE — Progress Notes (Signed)
Delivered TOC medications from pharmacy to patient room. RN made aware as well.

## 2022-12-25 NOTE — Progress Notes (Unsigned)
Enrolled for [employer] to mail a ZIO XT long term holter monitor to the patients address on file.  ? ?Dr. Hochrein to read. ?

## 2022-12-25 NOTE — Progress Notes (Signed)
Discharge instructions provided to patient and she verbalized understanding. Patient discharged home with family on oxygen with additional tanks for home.

## 2022-12-25 NOTE — Telephone Encounter (Signed)
Patient being discharged today, needs 14 day non live Zio to evaluate burden of atrial tachycardia. Patient aware will be mailed to her house. Thank you!

## 2022-12-25 NOTE — Discharge Summary (Addendum)
Physician Discharge Summary   KYIESHA MILLWARD ZOX:[id number] DOB: 12/06/1950 DOA: 12/22/2022  PCP: Mayer Masker, PA-C (Inactive)  Admit date: 12/22/2022 Discharge date:  12/25/2022  Admitted From: Home Disposition:  Home Discharging physician: Lewie Chamber, MD Barriers to discharge: none  Recommendations at discharge: Wean O2 to off Follow up with cardiology and review zio patch   Equipment/Devices: Home Oxygen  Discharge Condition: stable CODE STATUS: Full Diet recommendation:  Diet Orders (From admission, onward)     Start     Ordered   12/23/22 0651  DIET SOFT Room service appropriate? Yes; Fluid consistency: Thin  Diet effective now       Question Answer Comment  Room service appropriate? Yes   Fluid consistency: Thin      12/23/22 0651            Hospital Course: 72 y.o. female with medical history significant of hyperlipidemia, COPD mixed type followed by Dr. Maple Hudson of pulmonology, anemia, history of breast cancer, GERD, who presents with complaints of productive cough.  Symptoms initially started about 9 days ago with complaints of cough.  Sinus congestion and sneezing.  She initially thought symptoms were related to her allergies.  Associated symptoms included sore throat, wheezing, loose stools, hot/cold flashes, and reports weight loss of approximately 5 pounds over last few months.    5 days ago she started having low-grade fevers and reported feeling more short of breath with any kind of movement or activity.  Patient denied having any chest pain or palpitations, or leg swelling. She does make note that she has been caring for her husband who had been sick and passed away approximately 2 weeks ago.  She makes note that she had a prior 50 smoking pack-year history, but quit over 20 years ago.    4/24: Respiratory viral panel positive for RSV.  Cardiology consult appreciated.  Unclear nature of atrial tachyarrhythmia.  MAT versus atrial fibrillation.   Anticoagulation not indicated per cardiology.  Assessment and Plan:  New onset atrial tachyarrhythmia Exact etiology of arrhythmia is unclear.  Atrial fibrillation versus MAT.  Episode short.  Cardiology consulted.  Patient not meeting criteria for anticoagulation at this time. - Monitored on telemetry during hospitalization and will be arranged for Zio patch per cardiology at discharge -Cardizem continued at discharge   COPD with acute bronchitis RSV infection Acute hypoxic respiratory failure Acute. Patient reports having symptoms of cough, congestion, wheezing, and low-grade fevers over the last week.  No wheezing currently appreciated on physical exam but decreased overall aeration.  Chest x-ray noted hyperinflation without acute abnormality.  COVID-19 screening was negative.   Prednisone course continued at discharge  -Unable to wean oxygen off prior to discharge.  Patient amenable with home O2   Hypokalemia - repleted    History of Cardiomyopathy Previously thought secondary to Herceptin with EF noted to be as low as 34% in 2009/2010 and subsequent recoveryin 2011 to 55-60%. -Echo repeated during hospitalization.  EF 60 to 65%, no RWMA, grade 1 diastolic dysfunction   Anxiety and depression -Continue Celexa and hydroxyzine as needed   History of breast cancer Patient previously diagnosed with left-sided breast cancer back in 11/2007.   She underwent bilateral mastectomies.  Patient was treated with Taxol and Herceptin chemotherapy.   Complicated grief Patient unfortunately lost her husband 2 weeks prior to presentation.  It is possible that arrhythmia may be related to complicated grief.  Will involve chaplain   Underweight BMI 15.62.  Suspect protein calorie malnutrition.  The patient's chronic medical conditions were treated accordingly per the patient's home medication regimen except as noted.  On day of discharge, patient was felt deemed stable for discharge.  Patient/family member advised to call PCP or come back to ER if needed.   Principal Diagnosis: Tachyarrhythmia  Discharge Diagnoses: Active Hospital Problems   Diagnosis Date Noted   Tachyarrhythmia 12/22/2022    Priority: 1.   Acute bronchitis due to respiratory syncytial virus (RSV) 12/25/2022    Priority: 1.   Acute respiratory failure with hypoxia (HCC) 12/25/2022    Priority: 2.   COPD (chronic obstructive pulmonary disease) with acute bronchitis (HCC) 12/22/2022    Priority: 2.   Hypokalemia 12/22/2022    Priority: 3.   History of cardiomyopathy 12/22/2022    Priority: 4.   Anxiety,depression, insomnia 10/12/2007    Priority: 5.   BREAST CANCER, HX OF 11/02/2008    Priority: 6.   Protein-calorie malnutrition, severe (HCC) 12/24/2022    Resolved Hospital Problems  No resolved problems to display.     Discharge Instructions     Increase activity slowly   Complete by: As directed       Allergies as of 12/25/2022       Reactions   Amoxicillin-pot Clavulanate    diarrhea   Doxycycline    Nausea & vomiting   Venlafaxine    ? Reaction; ? Blurred vision        Medication List     STOP taking these medications    Calcium Carbonate-Vitamin D 600-400 MG-UNIT tablet       TAKE these medications    AeroChamber MV inhaler Use as instructed   albuterol 108 (90 Base) MCG/ACT inhaler Commonly known as: VENTOLIN HFA INHALE 1-2 PUFFS BY MOUTH EVERY 6 HOURS AS NEEDED FOR WHEEZE OR SHORTNESS OF BREATH What changed: See the new instructions.   cetirizine 10 MG tablet Commonly known as: ZYRTEC Take 10 mg by mouth daily.   citalopram 40 MG tablet Commonly known as: CeleXA 1po qd Filled by Arlana Hove-  Nurse Practitioner, [facility] What changed:  how much to take how to take this when to take this additional instructions   diltiazem 120 MG 24 hr capsule Commonly known as: CARDIZEM CD Take 1 capsule (120 mg total) by mouth daily. Start  taking on: December 26, 2022   hydrOXYzine 10 MG tablet Commonly known as: ATARAX Take 10 mg by mouth every 6 (six) hours as needed for anxiety.   Incruse Ellipta 62.5 MCG/ACT Aepb Generic drug: umeclidinium bromide TAKE 1 PUFF BY MOUTH EVERY DAY What changed: See the new instructions.   methocarbamol 750 MG tablet Commonly known as: ROBAXIN TAKE 1 TABLET BY MOUTH 2 TIMES DAILY AS NEEDED FOR MUSCLE SPASMS (NON FORMULARY) What changed: See the new instructions.   predniSONE 20 MG tablet Commonly known as: DELTASONE Take 2 tablets (40 mg total) by mouth daily with breakfast for 5 days. Start taking on: December 26, 2022   Vitamin D (Ergocalciferol) 1.25 MG (50000 UNIT) Caps capsule Commonly known as: DRISDOL TAKE 1 CAPSULE BY MOUTH EVERY 7 DAYS               Durable Medical Equipment  (From admission, onward)           Start     Ordered   12/25/22 1442  For home use only DME oxygen  Once       Question Answer Comment  Length of Need 6 Months  Mode or (Route) Nasal cannula   Liters per Minute 4   Frequency Continuous (stationary and portable oxygen unit needed)   Oxygen conserving device Yes   Oxygen delivery system Gas      12/25/22 1441            Follow-up Information     Monge, Petra Kuba, NP Follow up.   Specialties: Cardiology, Family Medicine Why: [facility] location - follow-up arranged on Tuesday Jan 26, 2023 1:55 PM (Arrive by 1:40 PM). Irving Burton is one of our nurse practitioners with our cardiology team. Contact information: [address] [place] [address] [phone]         [facility] Follow up.   Specialty: Cardiology Why: The cardiology office will mail you a heart monitor to wear for 2 weeks. It will come with instructions for how to apply, use, and mail back. Please call the monitor company on the box if you have any questions or our office number above. Contact  information: [address] [id number] mc [place] [phone]        Mayer Masker, PA-C. Schedule an appointment as soon as possible for a visit in 1 week(s).   Specialty: Physician Assistant Contact information: [address] Suite [place] [address] [phone]         [facility] Follow up.   Why: Oxygen Contact information: [address] [place] [address] [phone]                Allergies  Allergen Reactions   Amoxicillin-Pot Clavulanate     diarrhea   Doxycycline     Nausea & vomiting   Venlafaxine     ? Reaction; ? Blurred vision    Consultations: Cardiology  Procedures:   Discharge Exam: BP 117/73 (BP Location: Left Arm)   Pulse 86   Temp 97.7 F (36.5 C) (Oral)   Resp 18   SpO2 91%  Physical Exam Constitutional:      Appearance: Normal appearance.  HENT:     Head: Normocephalic and atraumatic.     Mouth/Throat:     Mouth: Mucous membranes are moist.  Eyes:     Extraocular Movements: Extraocular movements intact.  Cardiovascular:     Rate and Rhythm: Normal rate. Rhythm irregular.  Pulmonary:     Effort: Pulmonary effort is normal.     Breath sounds: Normal breath sounds.  Abdominal:     General: Bowel sounds are normal. There is no distension.     Palpations: Abdomen is soft.     Tenderness: There is no abdominal tenderness.  Musculoskeletal:        General: Normal range of motion.     Cervical back: Normal range of motion and neck supple.  Skin:    General: Skin is warm and dry.  Neurological:     General: No focal deficit present.     Mental Status: She is alert.  Psychiatric:        Mood and Affect: Mood normal.        Behavior: Behavior normal.      The results of significant diagnostics from this hospitalization (including imaging, microbiology, ancillary and laboratory) are listed below for reference.   Microbiology: Recent Results (from  the past 240 hour(s))  SARS Coronavirus 2 by RT PCR (hospital order, performed in [facility] hospital lab) *cepheid single result test* Anterior Nasal Swab  Status: None   Collection Time: 12/22/22  1:01 PM   Specimen: Anterior Nasal Swab  Result Value Ref Range Status   SARS Coronavirus 2 by RT PCR NEGATIVE NEGATIVE Final    Comment: Performed at [facility], [address], [place], [place] [address]  Blood culture (routine x 2)     Status: None (Preliminary result)   Collection Time: 12/22/22  1:08 PM   Specimen: BLOOD LEFT ARM  Result Value Ref Range Status   Specimen Description BLOOD LEFT ARM  Final   Special Requests   Final    BOTTLES DRAWN AEROBIC AND ANAEROBIC Blood Culture results may not be optimal due to an inadequate volume of blood received in culture bottles   Culture   Final    NO GROWTH 3 DAYS Performed at [facility], [address] [place], [place] [address]    Report Status PENDING  Incomplete  Blood culture (routine x 2)     Status: None (Preliminary result)   Collection Time: 12/22/22  1:13 PM   Specimen: BLOOD RIGHT ARM  Result Value Ref Range Status   Specimen Description BLOOD RIGHT ARM  Final   Special Requests   Final    BOTTLES DRAWN AEROBIC AND ANAEROBIC Blood Culture results may not be optimal due to an inadequate volume of blood received in culture bottles   Culture   Final    NO GROWTH 3 DAYS Performed at [facility], [address], [place], [place] [address]    Report Status PENDING  Incomplete  Respiratory (~20 pathogens) panel by PCR     Status: Abnormal   Collection Time: 12/22/22  5:12 PM   Specimen: Nasopharyngeal Swab; Respiratory  Result Value Ref Range Status   Adenovirus NOT DETECTED NOT DETECTED Corrected   Coronavirus 229E NOT DETECTED NOT DETECTED Corrected    Comment: (NOTE) The Coronavirus on the Respiratory Panel, DOES NOT test for the novel  Coronavirus (2019 nCoV) CORRECTED ON 04/23 AT 2239:  PREVIOUSLY REPORTED AS NOT DETECTED    Coronavirus HKU1 NOT DETECTED NOT DETECTED Corrected   Coronavirus NL63 NOT DETECTED NOT DETECTED Corrected   Coronavirus OC43 NOT DETECTED NOT DETECTED Corrected   Metapneumovirus NOT DETECTED NOT DETECTED Corrected   Rhinovirus / Enterovirus NOT DETECTED NOT DETECTED Corrected   Influenza A NOT DETECTED NOT DETECTED Corrected   Influenza B NOT DETECTED NOT DETECTED Corrected   Parainfluenza Virus 1 NOT DETECTED NOT DETECTED Corrected   Parainfluenza Virus 2 NOT DETECTED NOT DETECTED Corrected   Parainfluenza Virus 3 NOT DETECTED NOT DETECTED Corrected   Parainfluenza Virus 4 NOT DETECTED NOT DETECTED Corrected   Respiratory Syncytial Virus DETECTED (A) NOT DETECTED Corrected   Bordetella pertussis NOT DETECTED NOT DETECTED Corrected   Bordetella Parapertussis NOT DETECTED NOT DETECTED Corrected   Chlamydophila pneumoniae NOT DETECTED NOT DETECTED Corrected   Mycoplasma pneumoniae NOT DETECTED NOT DETECTED Corrected    Comment: Performed at [facility], [address], [place], [place] [address]     Labs: BNP (last 3 results) Recent Labs    12/22/22 1300  BNP 49.0   Basic Metabolic Panel: Recent Labs  Lab 12/22/22 1300 12/23/22 0147 12/24/22 0314  NA 134* 133* 137  K 2.8* 4.1 3.6  CL 97* 101 101  CO2 21* 24 25  GLUCOSE 144* 196* 119*  BUN 21 16 24*  CREATININE 0.94 0.69 0.79  CALCIUM 9.4 8.8* 8.9  MG  --  1.9  --  Liver Function Tests: No results for input(s): "AST", "ALT", "ALKPHOS", "BILITOT", "PROT", "ALBUMIN" in the last 168 hours. No results for input(s): "LIPASE", "AMYLASE" in the last 168 hours. No results for input(s): "AMMONIA" in the last 168 hours. CBC: Recent Labs  Lab 12/22/22 1300 12/23/22 0147  WBC 11.6* 7.1  HGB 14.5 12.8  HCT 43.5 36.6  MCV 91.4 89.1  PLT 265 243   Cardiac Enzymes: No results for input(s): "CKTOTAL", "CKMB", "CKMBINDEX", "TROPONINI" in the last 168 hours. BNP: Invalid  input(s): "POCBNP" CBG: No results for input(s): "GLUCAP" in the last 168 hours. D-Dimer No results for input(s): "DDIMER" in the last 72 hours. Hgb A1c No results for input(s): "HGBA1C" in the last 72 hours. Lipid Profile No results for input(s): "CHOL", "HDL", "LDLCALC", "TRIG", "CHOLHDL", "LDLDIRECT" in the last 72 hours. Thyroid function studies No results for input(s): "TSH", "T4TOTAL", "T3FREE", "THYROIDAB" in the last 72 hours.  Invalid input(s): "FREET3" Anemia work up No results for input(s): "VITAMINB12", "FOLATE", "FERRITIN", "TIBC", "IRON", "RETICCTPCT" in the last 72 hours. Urinalysis    Component Value Date/Time   COLORURINE STRAW (A) 09/20/2017 1439   APPEARANCEUR CLEAR 09/20/2017 1439   LABSPEC 1.006 09/20/2017 1439   PHURINE 5.0 09/20/2017 1439   GLUCOSEU 50 (A) 09/20/2017 1439   GLUCOSEU NEGATIVE 08/12/2006 0922   HGBUR MODERATE (A) 09/20/2017 1439   BILIRUBINUR NEGATIVE 09/20/2017 1439   BILIRUBINUR negative 09/09/2011 1535   KETONESUR NEGATIVE 09/20/2017 1439   PROTEINUR NEGATIVE 09/20/2017 1439   UROBILINOGEN 0.2 09/09/2011 1535   UROBILINOGEN 0.2 08/03/2008 1405   NITRITE NEGATIVE 09/20/2017 1439   LEUKOCYTESUR LARGE (A) 09/20/2017 1439   Sepsis Labs Recent Labs  Lab 12/22/22 1300 12/23/22 0147  WBC 11.6* 7.1   Microbiology Recent Results (from the past 240 hour(s))  SARS Coronavirus 2 by RT PCR (hospital order, performed in [facility] hospital lab) *cepheid single result test* Anterior Nasal Swab     Status: None   Collection Time: 12/22/22  1:01 PM   Specimen: Anterior Nasal Swab  Result Value Ref Range Status   SARS Coronavirus 2 by RT PCR NEGATIVE NEGATIVE Final    Comment: Performed at [facility], [address] [place], [place] [address]  Blood culture (routine x 2)     Status: None (Preliminary result)   Collection Time: 12/22/22  1:08 PM   Specimen: BLOOD LEFT ARM  Result Value Ref Range Status   Specimen Description BLOOD  LEFT ARM  Final   Special Requests   Final    BOTTLES DRAWN AEROBIC AND ANAEROBIC Blood Culture results may not be optimal due to an inadequate volume of blood received in culture bottles   Culture   Final    NO GROWTH 3 DAYS Performed at [facility], [address], [place], [place] [address]    Report Status PENDING  Incomplete  Blood culture (routine x 2)     Status: None (Preliminary result)   Collection Time: 12/22/22  1:13 PM   Specimen: BLOOD RIGHT ARM  Result Value Ref Range Status   Specimen Description BLOOD RIGHT ARM  Final   Special Requests   Final    BOTTLES DRAWN AEROBIC AND ANAEROBIC Blood Culture results may not be optimal due to an inadequate volume of blood received in culture bottles   Culture   Final    NO GROWTH 3 DAYS Performed at [facility], [address] [place], [place] [address]    Report Status  PENDING  Incomplete  Respiratory (~20 pathogens) panel by PCR     Status: Abnormal   Collection Time: 12/22/22  5:12 PM   Specimen: Nasopharyngeal Swab; Respiratory  Result Value Ref Range Status   Adenovirus NOT DETECTED NOT DETECTED Corrected   Coronavirus 229E NOT DETECTED NOT DETECTED Corrected    Comment: (NOTE) The Coronavirus on the Respiratory Panel, DOES NOT test for the novel  Coronavirus (2019 nCoV) CORRECTED ON 04/23 AT 2239: PREVIOUSLY REPORTED AS NOT DETECTED    Coronavirus HKU1 NOT DETECTED NOT DETECTED Corrected   Coronavirus NL63 NOT DETECTED NOT DETECTED Corrected   Coronavirus OC43 NOT DETECTED NOT DETECTED Corrected   Metapneumovirus NOT DETECTED NOT DETECTED Corrected   Rhinovirus / Enterovirus NOT DETECTED NOT DETECTED Corrected   Influenza A NOT DETECTED NOT DETECTED Corrected   Influenza B NOT DETECTED NOT DETECTED Corrected   Parainfluenza Virus 1 NOT DETECTED NOT DETECTED Corrected   Parainfluenza Virus 2 NOT DETECTED NOT DETECTED Corrected   Parainfluenza Virus 3 NOT DETECTED NOT DETECTED Corrected   Parainfluenza  Virus 4 NOT DETECTED NOT DETECTED Corrected   Respiratory Syncytial Virus DETECTED (A) NOT DETECTED Corrected   Bordetella pertussis NOT DETECTED NOT DETECTED Corrected   Bordetella Parapertussis NOT DETECTED NOT DETECTED Corrected   Chlamydophila pneumoniae NOT DETECTED NOT DETECTED Corrected   Mycoplasma pneumoniae NOT DETECTED NOT DETECTED Corrected    Comment: Performed at [facility], [address] [place], [place] [address]    Procedures/Studies: ECHOCARDIOGRAM COMPLETE  Result Date: 12/23/2022    ECHOCARDIOGRAM REPORT   Patient Name:   BERNELL HAYNIE Date of Exam: 12/23/2022 Medical Rec #:  [id number]         Height:       60.0 in Accession #:    [id number]        Weight:       80.0 lb Date of Birth:  [birth year]        BSA:          1.265 m Patient Age:    71 years          BP:           111/64 mmHg Patient Gender: F                 HR:           88 bpm. Exam Location:  Inpatient Procedure: 2D Echo, Color Doppler and Cardiac Doppler Indications:    Afib in ER  History:        Patient has no prior history of Echocardiogram examinations.                 Cardiomyopathy, COPD, Arrythmias:Atrial Fibrillation; Risk                 Factors:Dyslipidemia.  Sonographer:    Milbert Coulter Referring Phys: [id number] RONDELL A SMITH  Sonographer Comments: Suboptimal apical window. Image acquisition challenging due to mastectomy and Image acquisition challenging due to breast implants. IMPRESSIONS  1. Left ventricular ejection fraction, by estimation, is 60 to 65%. The left ventricle has normal function. The left ventricle has no regional wall motion abnormalities. Left ventricular diastolic parameters are consistent with Grade I diastolic dysfunction (impaired relaxation).  2. Right ventricular systolic function is normal. The right ventricular size is normal. There is normal pulmonary artery systolic pressure. The estimated right ventricular systolic pressure is 32.6 mmHg.  3. The mitral valve is normal  in structure. Mild mitral valve  regurgitation. No evidence of mitral stenosis.  4. The aortic valve is normal in structure. Aortic valve regurgitation is not visualized. No aortic stenosis is present.  5. Aortic dilatation noted. There is mild dilatation of the aortic root, measuring 40 mm.  6. The inferior vena cava is normal in size with greater than 50% respiratory variability, suggesting right atrial pressure of 3 mmHg. FINDINGS  Left Ventricle: Left ventricular ejection fraction, by estimation, is 60 to 65%. The left ventricle has normal function. The left ventricle has no regional wall motion abnormalities. The left ventricular internal cavity size was normal in size. There is  no left ventricular hypertrophy. Left ventricular diastolic parameters are consistent with Grade I diastolic dysfunction (impaired relaxation). Normal left ventricular filling pressure. Right Ventricle: The right ventricular size is normal. No increase in right ventricular wall thickness. Right ventricular systolic function is normal. There is normal pulmonary artery systolic pressure. The tricuspid regurgitant velocity is 2.72 m/s, and  with an assumed right atrial pressure of 3 mmHg, the estimated right ventricular systolic pressure is 32.6 mmHg. Left Atrium: Left atrial size was normal in size. Right Atrium: Right atrial size was normal in size. Pericardium: Trivial pericardial effusion is present. The pericardial effusion is anterior to the right ventricle. Mitral Valve: The mitral valve is normal in structure. Mild mitral valve regurgitation. No evidence of mitral valve stenosis. Tricuspid Valve: The tricuspid valve is normal in structure. Tricuspid valve regurgitation is mild . No evidence of tricuspid stenosis. Aortic Valve: The aortic valve is normal in structure. Aortic valve regurgitation is not visualized. No aortic stenosis is present. Pulmonic Valve: The pulmonic valve was normal in structure. Pulmonic valve regurgitation  is not visualized. No evidence of pulmonic stenosis. Aorta: Aortic dilatation noted. There is mild dilatation of the aortic root, measuring 40 mm. Venous: The inferior vena cava is normal in size with greater than 50% respiratory variability, suggesting right atrial pressure of 3 mmHg. IAS/Shunts: No atrial level shunt detected by color flow Doppler.  LEFT VENTRICLE PLAX 2D LVIDd:         3.40 cm   Diastology LVIDs:         1.90 cm   LV e' medial:    8.03 cm/s LV PW:         0.90 cm   LV E/e' medial:  6.1 LV IVS:        0.80 cm   LV e' lateral:   10.30 cm/s LVOT diam:     1.50 cm   LV E/e' lateral: 4.7 LVOT Area:     1.77 cm  RIGHT VENTRICLE RV S prime:     19.70 cm/s TAPSE (M-mode): 2.0 cm LEFT ATRIUM         Index LA diam:    3.10 cm 2.45 cm/m   AORTA Ao Root diam: 4.00 cm MITRAL VALVE               TRICUSPID VALVE MV Area (PHT): 3.48 cm    TR Peak grad:   29.6 mmHg MV Decel Time: 218 msec    TR Vmax:        272.00 cm/s MV E velocity: 48.70 cm/s MV A velocity: 77.00 cm/s  SHUNTS MV E/A ratio:  0.63        Systemic Diam: 1.50 cm Armanda Magic MD Electronically signed by Armanda Magic MD Signature Date/Time: 12/23/2022/2:11:03 PM    Final    DG Chest Port 1 View  Result Date: 12/22/2022 CLINICAL DATA:  Cough EXAM: PORTABLE CHEST 1 VIEW COMPARISON:  04/21/2022 FINDINGS: Hyperinflation. No consolidation, pneumothorax, effusion or edema. Normal cardiopericardial silhouette. Curvature of the spine. Surgical changes overlying the left hemithorax. Fixation hardware along the lower cervical spine. Overlapping cardiac leads. IMPRESSION: Hyperinflation.  No acute cardiopulmonary disease Electronically Signed   By: Karen Kays M.D.   On: 12/22/2022 13:29     Time coordinating discharge: Over 30 minutes    Lewie Chamber, MD  [facility] 12/25/2022, 4:44 PM

## 2022-12-25 NOTE — Progress Notes (Signed)
Mobility Specialist Progress Note:   12/25/22 1140  Mobility  Activity Ambulated with assistance in hallway  Level of Assistance Contact guard assist, steadying assist  Assistive Device None  Distance Ambulated (ft) 100 ft  Activity Response Tolerated fair  Mobility Referral Yes  $Mobility charge 1 Mobility   Pt agreeable to mobility session. Required no physical assistance, only minG for safety. Pt required 4 LO2 to maintain SpO2 WFL. Pt back in bed with all needs met.   Addison Lank Mobility Specialist Please contact via SecureChat or  Rehab office at [phone]

## 2022-12-25 NOTE — TOC Initial Note (Signed)
Transition of Care [facility]) - Initial/Assessment Note    Patient Details  Name: [name] MRN: [id number] Date of Birth: 01/15/1951  Transition of Care [facility]) CM/SW Contact:    Gala Lewandowsky, RN Phone Number: 12/25/2022, 2:57 PM  Clinical Narrative:  Plan for patient to transition home today. Patient has qualified for oxygen for home. Case Manager spoke with patient and she wants to use [employer] for DME needs. Referral submitted to [employer] and oxygen will be delivered to the room. No further needs identified at this time.                  Expected Discharge Plan: Home/Self Care Barriers to Discharge: No Barriers Identified  Patient Goals and CMS Choice Patient states their goals for this hospitalization and ongoing recovery are:: Plan to return home.  Expected Discharge Plan and Services In-house Referral: NA Discharge Planning Services: CM Consult Post Acute Care Choice: Durable Medical Equipment Living arrangements for the past 2 months: Single Family Home Expected Discharge Date: 12/25/22               DME Arranged: Oxygen DME Agency: [employer] Date DME Agency Contacted: 12/25/22 Time DME Agency Contacted: [phone] Representative spoke with at DME Agency: Barbara Cower HH Arranged: NA  Prior Living Arrangements/Services Living arrangements for the past 2 months: Single Family Home Lives with:: Self Patient language and need for interpreter reviewed:: Yes Do you feel safe going back to the place where you live?: Yes      Need for Family Participation in Patient Care: No (Comment) Care giver support system in place?: No (comment)   Criminal Activity/Legal Involvement Pertinent to Current Situation/Hospitalization: No - Comment as needed  Activities of Daily Living Home Assistive Devices/Equipment: Eyeglasses, Hearing aid, Dentures (specify type) (lowers (left at home) hearing aids left at home) ADL Screening (condition at time of admission) Patient's cognitive ability  adequate to safely complete daily activities?: Yes Is the patient deaf or have difficulty hearing?: No Does the patient have difficulty seeing, even when wearing glasses/contacts?: No Does the patient have difficulty concentrating, remembering, or making decisions?: No Patient able to express need for assistance with ADLs?: Yes Does the patient have difficulty dressing or bathing?: No Independently performs ADLs?: Yes (appropriate for developmental age) Does the patient have difficulty walking or climbing stairs?: No Weakness of Legs: None Weakness of Arms/Hands: None  Permission Sought/Granted Permission sought to share information with : Case Manager       Permission granted to share info w AGENCY: [employer]  Alcohol / Substance Use: Not Applicable Psych Involvement: No (comment)  Admission diagnosis:  Bronchitis [J40] New onset atrial fibrillation (HCC) [I48.91] Atrial fibrillation with RVR (HCC) [I48.91] Patient Active Problem List   Diagnosis Date Noted   Protein-calorie malnutrition, severe (HCC) 12/24/2022   Tachycardia 12/23/2022   Acute cough 12/22/2022   Atrial fibrillation (HCC) 12/22/2022   New onset atrial fibrillation (HCC) 12/22/2022   COPD (chronic obstructive pulmonary disease) with acute bronchitis (HCC) 12/22/2022   Hypokalemia 12/22/2022   History of cardiomyopathy 12/22/2022   Nasal congestion 09/06/2020   History of thrush 09/22/2019   Estrogen deficiency 06/28/2019   Post-menopausal 06/28/2019   Mood disorder (HCC) 03/17/2018   Osteopenia after menopause 11/06/2017   Medial epicondylitis of right elbow 10/14/2017   MDD (major depressive disorder), severe (HCC) 09/22/2017   MDD (major depressive disorder), recurrent episode, moderate (HCC)    Prolonged Q-T interval on ECG 09/20/2017   Overdose 09/20/2017   Acute cystitis  with hematuria    Suicide attempt [facility])    Family history of diabetes mellitus- mom in 73's 08/24/17    Class: Chronic   Family  history of colon cancer- sister died 45yo 08-24-17   Adjustment disorder with mixed anxiety < depressed mood 08-24-2017   B12 deficiency 2017-08-24   Fibromyalgia syndrome 11/23/2016   Other insomnia 11/23/2016   Trapezius muscle spasm 11/23/2016   DDD (degenerative disc disease), cervical 11/23/2016   DDD (degenerative disc disease), lumbar 11/23/2016   Other idiopathic scoliosis, lumbar region 11/23/2016   Malignant neoplasm of female breast (HCC) 11/23/2016   History of gastroesophageal reflux (GERD) 11/23/2016   PCP NOTES >>>>>>>>>>>>>>>>>>>> 11/21/2015   Constipation 02/15/2015   Annual physical exam 11/20/2013   COPD mixed type (HCC) 11/20/2013   Unspecified adverse effect of unspecified drug, medicinal and biological substance 12/10/2011   Vitamin D deficiency 07/06/2011   CARDIOMYOPATHY, PRIMARY, DILATED 01/01/2009   BREAST CANCER, HX OF 11/02/2008   h/o HYPERLIPIDEMIA 10/18/2007   GERD 10/18/2007   ANEMIA, B12 DEFICIENCY 10/12/2007   Anxiety,depression, insomnia 10/12/2007   IBS 10/12/2007   Primary osteoarthritis of both hands 10/12/2007   Osteoporosis 10/12/2007   COLONIC POLYPS, HX OF 10/12/2007   MIGRAINES, HX OF 10/12/2007   Fibromyalgia--Dr. Victory Dakin, on Ultram 07/14/2007   PCP:  Mayer Masker, PA-C (Inactive) Pharmacy:   [id number] - [place], [place] - [address] [place] [address] Phone: [phone] Fax: [phone]  [facility] [address] [place] [address] Phone: [phone] Fax: [phone]  Social Determinants of Health (SDOH) Social History: SDOH Screenings   Food Insecurity: No Food Insecurity (12/22/2022)  Housing: Low Risk  (12/22/2022)  Transportation Needs: No Transportation Needs (12/22/2022)  Utilities: Not At Risk (12/22/2022)  Alcohol Screen: Low Risk  (09/22/2017)  Depression (PHQ2-9): Medium Risk (09/21/2019)  Tobacco Use: Medium Risk (12/23/2022)    Readmission Risk Interventions     No data to display

## 2022-12-25 NOTE — Care Management (Signed)
    Durable Medical Equipment  (From admission, onward)           Start     Ordered   12/25/22 1442  For home use only DME oxygen  Once       Question Answer Comment  Length of Need 6 Months   Mode or (Route) Nasal cannula   Liters per Minute 4   Frequency Continuous (stationary and portable oxygen unit needed)   Oxygen conserving device Yes   Oxygen delivery system Gas      12/25/22 1441              Nurse requested Mobility Specialist to perform oxygen saturation test with pt which includes removing pt from oxygen both at rest and while ambulating.  Below are the results from that testing.      Patient Saturations on Room Air at Rest = spO2 89%   Patient Saturations on Room Air while Ambulating = sp02 77% .   Patient Saturations on 4 Liters of oxygen while Ambulating = sp02 89%   At end of testing pt left in room on 4  Liters of oxygen.   Reported results to nurse.  Addison Lank Mobility Specialist Please contact via SecureChat or  Rehab office at [phone]

## 2022-12-27 LAB — CULTURE, BLOOD (ROUTINE X 2)
Culture: NO GROWTH
Culture: NO GROWTH

## 2022-12-28 ENCOUNTER — Telehealth: Payer: Self-pay

## 2022-12-28 ENCOUNTER — Telehealth: Payer: Self-pay | Admitting: *Deleted

## 2022-12-28 NOTE — Transitions of Care (Post Inpatient/ED Visit) (Signed)
   12/28/2022  Name: [name] MRN: [id number] DOB: [birth year]  Today's TOC FU Call Status: Today's TOC FU Call Status:: Successful TOC FU Call Competed TOC FU Call Complete Date: 12/28/22  Transition Care Management Follow-up Telephone Call Date of Discharge: 12/25/22 Discharge Facility: [facility]) Type of Discharge: Inpatient Admission Primary Inpatient Discharge Diagnosis:: Tachycardia How have you been since you were released from the hospital?: Better Any questions or concerns?: No  Items Reviewed: Did you receive and understand the discharge instructions provided?: Yes Medications obtained and verified?: Yes (Medications Reviewed) Any new allergies since your discharge?: No Dietary orders reviewed?: Yes Type of Diet Ordered:: Soft Do you have support at home?: Yes People in Home: child(ren), dependent Name of Support/Comfort Primary Source: [name] (patient's husband passed 2 weeks ago)  Home Care and Equipment/Supplies: Were Home Health Services Ordered?: No Any new equipment or medical supplies ordered?: Yes Name of Medical supply agency?: [employer] Were you able to get the equipment/medical supplies?: Yes Do you have any questions related to the use of the equipment/supplies?: No  Functional Questionnaire: Do you need assistance with bathing/showering or dressing?: No Do you need assistance with meal preparation?: No Do you need assistance with eating?: No Do you have difficulty maintaining continence: No Do you need assistance with getting out of bed/getting out of a chair/moving?: No Do you have difficulty managing or taking your medications?: No  Follow up appointments reviewed: PCP Follow-up appointment confirmed?: Yes Date of PCP follow-up appointment?: 12/01/22 Follow-up Provider: Vincent Peterson Specialist [facility] Follow-up appointment confirmed?: Yes Date of Specialist follow-up appointment?: 01/26/23 Follow-Up Specialty Provider:: Rachael Person,  NP Do you need transportation to your follow-up appointment?: No Do you understand care options if your condition(s) worsen?: Yes-patient verbalized understanding  SDOH Interventions Today    Flowsheet Row Most Recent Value  SDOH Interventions   Food Insecurity Interventions Intervention Not Indicated  Housing Interventions Intervention Not Indicated      TOC Interventions Today    Flowsheet Row Most Recent Value  TOC Interventions   TOC Interventions Discussed/Reviewed TOC Interventions Discussed, Arranged PCP follow up within 7 days/Care Guide scheduled       Interventions Today    Flowsheet Row Most Recent Value  Chronic Disease   Chronic disease during today's visit Other  [Tachycardia]  General Interventions   General Interventions Discussed/Reviewed General Interventions Discussed  Mental Health Interventions   Mental Health Discussed/Reviewed Mental Health Discussed, Other  [Offered LCSW for counseling, patient declined]       Jodelle Gross, RN, BSN, CCM Care Management Coordinator [facility] Phone: [phone]/Fax: [phone]

## 2022-12-28 NOTE — Telephone Encounter (Signed)
LVM for pt to call office needing to change her appointment to a different time and provider due to provider being out of the office.

## 2022-12-30 DIAGNOSIS — I4719 Other supraventricular tachycardia: Secondary | ICD-10-CM

## 2022-12-30 NOTE — Progress Notes (Signed)
   Established Patient Office Visit  Subjective   Patient ID: [name], female    DOB: 04/10/1951  Age: 72 y.o. MRN: [id number]  Chief Complaint  Patient presents with   Hospitalization Follow-up    HPI [name] is a 72 y.o. female presenting today for follow up of hospitalization from 12/22/2022 through 12/25/2022.  Presented with productive cough, sinus congestion, sore throat, wheezing, loose stools, hot/cold flashes, low-grade fevers, and shortness of breath.  Tested positive for RSV, atrial fibrillation versus MAT present on EKG, cardiology consulted and reassured that anticoagulation not necessary at this time but was discharged with a Zio patch.  Sent home with oxygen and prednisone course for lingering symptoms.  Hypokalemia corrected in the hospital.  Echocardiogram was repeated, EF 60 to 65%.  She has continued with home oxygen and finish prednisone course.  She endorses some continued sinus congestion, but simultaneous nasal dryness to the point that she frequently sees blood when she blows her nose.  She has started using a humidifier and plans to get new saline spray at the store. Recommendations at discharge were to wean oxygen to off and follow-up with cardiology to review Zio patch data.  Cardiology appointment scheduled for 01/26/2023.  ROS Negative unless otherwise noted in HPI   Objective:     BP 107/79 (BP Location: Right Arm, Patient Position: Sitting, Cuff Size: Small)   Pulse 88   Resp (!) 22   Ht 5' (1.524 m)   Wt 82 lb (37.2 kg)   SpO2 96% Comment: O2  BMI 16.01 kg/m   Physical Exam Constitutional:      General: She is not in acute distress.    Appearance: Normal appearance.     Comments: Nasal cannula in place  HENT:     Head: Normocephalic and atraumatic.  Cardiovascular:     Rate and Rhythm: Normal rate and regular rhythm.     Pulses: Normal pulses.     Heart sounds: No murmur heard.    No friction rub. No gallop.  Pulmonary:      Effort: Pulmonary effort is normal. No respiratory distress.     Breath sounds: No wheezing, rhonchi or rales.  Skin:    General: Skin is warm and dry.  Neurological:     Mental Status: She is alert and oriented to person, place, and time.     Assessment & Plan:  Hospital discharge follow-up  Acute bronchitis due to respiratory syncytial virus (RSV) Assessment & Plan: Majority of symptoms have resolved.  Continue with humidifier and saline spray to reduce likelihood of getting a nosebleed because of dry mucous membranes.  Also emphasized the importance of maintaining adequate hydration.  Patient will need to follow-up with pulmonology, continue at home oxygen until that appointment.    Return in about 2 months (around 03/02/2023) for annual physical, fasting blood work 1 week before.  Return in about 8 months for follow-up on chronic conditions.  I spent 35 minutes on the day of the encounter to include pre-visit record review of hospital encounter and face-to-face time with the patient.   Melida Quitter, PA

## 2022-12-31 ENCOUNTER — Inpatient Hospital Stay: Payer: Medicare Other | Admitting: Nurse Practitioner

## 2022-12-31 ENCOUNTER — Ambulatory Visit (INDEPENDENT_AMBULATORY_CARE_PROVIDER_SITE_OTHER): Payer: Medicare Other | Admitting: Family Medicine

## 2022-12-31 VITALS — BP 107/79 | HR 88 | Resp 22 | Ht 60.0 in | Wt 82.0 lb

## 2022-12-31 DIAGNOSIS — J205 Acute bronchitis due to respiratory syncytial virus: Secondary | ICD-10-CM

## 2022-12-31 DIAGNOSIS — Z09 Encounter for follow-up examination after completed treatment for conditions other than malignant neoplasm: Secondary | ICD-10-CM

## 2022-12-31 NOTE — Telephone Encounter (Signed)
Pt had appointment today with different provider.

## 2022-12-31 NOTE — Patient Instructions (Addendum)
Continue using your oxygen until you see the pulmonologist.  Call them to schedule a hospital follow-up visit so that you can see them sooner than August.

## 2022-12-31 NOTE — Assessment & Plan Note (Signed)
Majority of symptoms have resolved.  Continue with humidifier and saline spray to reduce likelihood of getting a nosebleed because of dry mucous membranes.  Also emphasized the importance of maintaining adequate hydration.  Patient will need to follow-up with pulmonology, continue at home oxygen until that appointment.

## 2023-01-22 ENCOUNTER — Ambulatory Visit: Payer: Medicare Other | Attending: Nurse Practitioner | Admitting: Nurse Practitioner

## 2023-01-22 ENCOUNTER — Encounter: Payer: Self-pay | Admitting: Nurse Practitioner

## 2023-01-22 VITALS — BP 102/48 | HR 78 | Ht 60.0 in | Wt 85.0 lb

## 2023-01-22 DIAGNOSIS — J449 Chronic obstructive pulmonary disease, unspecified: Secondary | ICD-10-CM

## 2023-01-22 DIAGNOSIS — I428 Other cardiomyopathies: Secondary | ICD-10-CM | POA: Diagnosis not present

## 2023-01-22 DIAGNOSIS — I4719 Other supraventricular tachycardia: Secondary | ICD-10-CM | POA: Diagnosis not present

## 2023-01-22 NOTE — Patient Instructions (Signed)
Medication Instructions:  Your physician recommends that you continue on your current medications as directed. Please refer to the Current Medication list given to you today.  *If you need a refill on your cardiac medications before your next appointment, please call your pharmacy*   Lab Work: NONE ordered at this time of appointment   If you have labs (blood work) drawn today and your tests are completely normal, you will receive your results only by: MyChart Message (if you have MyChart) OR A paper copy in the mail If you have any lab test that is abnormal or we need to change your treatment, we will call you to review the results.   Testing/Procedures: NONE ordered at this time of appointment     Follow-Up: At [facility], you and your health needs are our priority.  As part of our continuing mission to provide you with exceptional heart care, we have created designated Provider Care Teams.  These Care Teams include your primary Cardiologist (physician) and Advanced Practice Providers (APPs -  Physician Assistants and Nurse Practitioners) who all work together to provide you with the care you need, when you need it.  We recommend signing up for the patient portal called "MyChart".  Sign up information is provided on this After Visit Summary.  MyChart is used to connect with patients for Virtual Visits (Telemedicine).  Patients are able to view lab/test results, encounter notes, upcoming appointments, etc.  Non-urgent messages can be sent to your provider as well.   To learn more about what you can do with MyChart, go to [email or web address].    Your next appointment:   6-8 week(s)  Provider:   [name], NP        Other Instructions

## 2023-01-22 NOTE — Progress Notes (Signed)
Office Visit    Patient Name: [name] Date of Encounter: 01/22/2023  Primary Care Provider:  Melida Quitter, PA Primary Cardiologist:  Rollene Rotunda, MD  Chief Complaint    72 year old female with a history of NICM, paroxysmal atrial fibrillation versus MAT, hyperlipidemia, B12 deficiency, fibromyalgia, COPD, breast cancer, and GERD who presents for hospital follow-up related to atrial tachycardia, possible atrial fibrillation.  Past Medical History    Past Medical History:  Diagnosis Date   Anemia    B12 deficient   B12 deficiency    Breast cancer (HCC)    left breast.   Chronic SI joint pain    Bilateral, Dr. Corliss Skains   Fibromyalgia    Dr. Corliss Skains   GERD (gastroesophageal reflux disease)    Greater trochanteric bursitis of both hips    Dr. Corliss Skains   Hyperlipidemia    IBS (irritable bowel syndrome)    LV dysfunction    iatrogenic from chemotherapy ; on Carvedilol    Migraines    Osteoporosis    Dr Lomax---> transferring to a new gyn   Past Surgical History:  Procedure Laterality Date   ABDOMINAL HYSTERECTOMY     BSO for  Endometriosis   CARPAL TUNNEL RELEASE     left   CERVICAL DISC SURGERY     COLONOSCOPY W/ POLYPECTOMY  1999   Dr Kinnie Scales   ESOPHAGEAL DILATION  2005   Dr Kinnie Scales   MASTECTOMY  2009   bilateral, Dr Jamey Ripa   SEPTOPLASTY      Allergies  Allergies  Allergen Reactions   Amoxicillin-Pot Clavulanate     diarrhea   Doxycycline     Nausea & vomiting   Venlafaxine     ? Reaction; ? Blurred vision     Labs/Other Studies Reviewed    The following studies were reviewed today:  Cardiac Studies & Procedures       ECHOCARDIOGRAM  ECHOCARDIOGRAM COMPLETE 12/23/2022  Narrative ECHOCARDIOGRAM REPORT    Patient Name:   [name] Date of Exam: 12/23/2022 Medical Rec #:  [id number]         Height:       60.0 in Accession #:    [id number]        Weight:       80.0 lb Date of Birth:  [birth year]        BSA:           1.265 m Patient Age:    71 years          BP:           111/64 mmHg Patient Gender: F                 HR:           88 bpm. Exam Location:  Inpatient  Procedure: 2D Echo, Color Doppler and Cardiac Doppler  Indications:    Afib in ER  History:        Patient has no prior history of Echocardiogram examinations. Cardiomyopathy, COPD, Arrythmias:Atrial Fibrillation; Risk Factors:Dyslipidemia.  Sonographer:    Milbert Coulter Referring Phys: [id number] RONDELL A SMITH   Sonographer Comments: Suboptimal apical window. Image acquisition challenging due to mastectomy and Image acquisition challenging due to breast implants. IMPRESSIONS   1. Left ventricular ejection fraction, by estimation, is 60 to 65%. The left ventricle has normal function. The left ventricle has no regional wall motion abnormalities. Left ventricular diastolic parameters are consistent with Grade I diastolic dysfunction (impaired  relaxation). 2. Right ventricular systolic function is normal. The right ventricular size is normal. There is normal pulmonary artery systolic pressure. The estimated right ventricular systolic pressure is 32.6 mmHg. 3. The mitral valve is normal in structure. Mild mitral valve regurgitation. No evidence of mitral stenosis. 4. The aortic valve is normal in structure. Aortic valve regurgitation is not visualized. No aortic stenosis is present. 5. Aortic dilatation noted. There is mild dilatation of the aortic root, measuring 40 mm. 6. The inferior vena cava is normal in size with greater than 50% respiratory variability, suggesting right atrial pressure of 3 mmHg.  FINDINGS Left Ventricle: Left ventricular ejection fraction, by estimation, is 60 to 65%. The left ventricle has normal function. The left ventricle has no regional wall motion abnormalities. The left ventricular internal cavity size was normal in size. There is no left ventricular hypertrophy. Left ventricular diastolic parameters are  consistent with Grade I diastolic dysfunction (impaired relaxation). Normal left ventricular filling pressure.  Right Ventricle: The right ventricular size is normal. No increase in right ventricular wall thickness. Right ventricular systolic function is normal. There is normal pulmonary artery systolic pressure. The tricuspid regurgitant velocity is 2.72 m/s, and with an assumed right atrial pressure of 3 mmHg, the estimated right ventricular systolic pressure is 32.6 mmHg.  Left Atrium: Left atrial size was normal in size.  Right Atrium: Right atrial size was normal in size.  Pericardium: Trivial pericardial effusion is present. The pericardial effusion is anterior to the right ventricle.  Mitral Valve: The mitral valve is normal in structure. Mild mitral valve regurgitation. No evidence of mitral valve stenosis.  Tricuspid Valve: The tricuspid valve is normal in structure. Tricuspid valve regurgitation is mild . No evidence of tricuspid stenosis.  Aortic Valve: The aortic valve is normal in structure. Aortic valve regurgitation is not visualized. No aortic stenosis is present.  Pulmonic Valve: The pulmonic valve was normal in structure. Pulmonic valve regurgitation is not visualized. No evidence of pulmonic stenosis.  Aorta: Aortic dilatation noted. There is mild dilatation of the aortic root, measuring 40 mm.  Venous: The inferior vena cava is normal in size with greater than 50% respiratory variability, suggesting right atrial pressure of 3 mmHg.  IAS/Shunts: No atrial level shunt detected by color flow Doppler.   LEFT VENTRICLE PLAX 2D LVIDd:         3.40 cm   Diastology LVIDs:         1.90 cm   LV e' medial:    8.03 cm/s LV PW:         0.90 cm   LV E/e' medial:  6.1 LV IVS:        0.80 cm   LV e' lateral:   10.30 cm/s LVOT diam:     1.50 cm   LV E/e' lateral: 4.7 LVOT Area:     1.77 cm   RIGHT VENTRICLE RV S prime:     19.70 cm/s TAPSE (M-mode): 2.0 cm  LEFT ATRIUM          Index LA diam:    3.10 cm 2.45 cm/m  AORTA Ao Root diam: 4.00 cm  MITRAL VALVE               TRICUSPID VALVE MV Area (PHT): 3.48 cm    TR Peak grad:   29.6 mmHg MV Decel Time: 218 msec    TR Vmax:        272.00 cm/s MV E velocity: 48.70 cm/s MV A velocity:  77.00 cm/s  SHUNTS MV E/A ratio:  0.63        Systemic Diam: 1.50 cm  Armanda Magic MD Electronically signed by Armanda Magic MD Signature Date/Time: 12/23/2022/2:11:03 PM    Final            Recent Labs: 06/29/2022: ALT 11 12/22/2022: B Natriuretic Peptide 49.0; TSH 3.375 12/23/2022: Hemoglobin 12.8; Magnesium 1.9; Platelets 243 12/24/2022: BUN 24; Creatinine, Ser 0.79; Potassium 3.6; Sodium 137  Recent Lipid Panel    Component Value Date/Time   CHOL 224 (H) 06/28/2019 1024   TRIG 95 06/28/2019 1024   TRIG 78 08/12/2006 0922   HDL 103 06/28/2019 1024   CHOLHDL 2.2 06/28/2019 1024   CHOLHDL 2 04/01/2017 0958   VLDL 16.6 04/01/2017 0958   LDLCALC 105 (H) 06/28/2019 1024   LDLDIRECT 143.0 10/18/2007 0000    History of Present Illness    72 year old female with the above past medical history including NICM, paroxysmal atrial fibrillation versus MAT, hyperlipidemia, B12 deficiency, fibromyalgia, COPD, breast cancer, and GERD.  She was previously seen by Dr. Gala Romney in the setting of nonischemic cardiomyopathy following treatment for breast cancer, EF as low as 34%, improved to 60 to 65%.  She presented to the ED on 12/22/2022 with complaints of productive cough in the setting of COPD exacerbation, bronchitis.  Two weeks prior to her hospitalization and her husband died of an inoperable brain tumor.  She was noted to be in possible atrial fibrillation versus multifocal atrial tachycardia with rapid rate.  Cardiology was consulted.  Repeat echocardiogram showed EF 60 to 65%, normal LV function, no RWMA, G1 DD, normal RV systolic function, mild mitral valve regurgitation.  Anticoagulation was deferred.  It was difficult to  discern between atrial fibrillation and multifocal atrial tachycardia.  She was started on diltiazem.  Outpatient cardiac monitor was placed, results pending.  She presents today for follow-up accompanied by her sister.  Since her hospitalization she has done well from a cardiac standpoint.  Her first monitor fell off.  She was sent in a monitor which she has brought with her today, she is requesting assistance with placement.  She has stable dyspnea, she is currently on 3 L O2 continuous.  She denies any chest pain, palpitations, dizziness, presyncope, syncope, edema, PND, orthopnea, weight gain.  Other than her ongoing dyspnea, she reports feeling well.  Home Medications    Current Outpatient Medications  Medication Sig Dispense Refill   albuterol (VENTOLIN HFA) 108 (90 Base) MCG/ACT inhaler INHALE 1-2 PUFFS BY MOUTH EVERY 6 HOURS AS NEEDED FOR WHEEZE OR SHORTNESS OF BREATH (Patient taking differently: Inhale 1 puff into the lungs every 4 (four) hours as needed for shortness of breath.) 8.5 each 0   cetirizine (ZYRTEC) 10 MG tablet Take 10 mg by mouth daily.     citalopram (CELEXA) 40 MG tablet 1po qd Filled by Arlana Hove-  Nurse Practitioner, [facility] (Patient taking differently: Take 40 mg by mouth daily.) 5 tablet 0   diltiazem (CARDIZEM CD) 120 MG 24 hr capsule Take 1 capsule (120 mg total) by mouth daily. 30 capsule 3   hydrOXYzine (ATARAX/VISTARIL) 10 MG tablet Take 10 mg by mouth every 6 (six) hours as needed for anxiety.     methocarbamol (ROBAXIN) 750 MG tablet TAKE 1 TABLET BY MOUTH 2 TIMES DAILY AS NEEDED FOR MUSCLE SPASMS (NON FORMULARY) (Patient taking differently: Take 750 mg by mouth every 6 (six) hours as needed for muscle spasms.) 60 tablet 2  umeclidinium bromide (INCRUSE ELLIPTA) 62.5 MCG/ACT AEPB TAKE 1 PUFF BY MOUTH EVERY DAY (Patient taking differently: Inhale 1 puff into the lungs daily.) 30 each 12   No current facility-administered medications for this  visit.     Review of Systems    She denies chest pain, palpitations, pnd, orthopnea, n, v, dizziness, syncope, edema, weight gain, or early satiety. All other systems reviewed and are otherwise negative except as noted above.   Physical Exam    VS:  BP (!) 102/48 (BP Location: Right Arm, Patient Position: Sitting, Cuff Size: Normal)   Pulse 78   Ht 5' (1.524 m)   Wt 85 lb (38.6 kg)   BMI 16.60 kg/m   GEN: Well nourished, well developed, in no acute distress. HEENT: normal. Neck: Supple, no JVD, carotid bruits, or masses. Cardiac: RRR, no murmurs, rubs, or gallops. No clubbing, cyanosis, edema.  Radials/DP/PT 2+ and equal bilaterally.  Respiratory:  Respirations regular and unlabored, clear to auscultation bilaterally. GI: Soft, nontender, nondistended, BS + x 4. MS: no deformity or atrophy. Skin: warm and dry, no rash. Neuro:  Strength and sensation are intact. Psych: Normal affect.  Accessory Clinical Findings    ECG personally reviewed by me today - NSR, 78 bpm- no acute changes.   Lab Results  Component Value Date   WBC 7.1 12/23/2022   HGB 12.8 12/23/2022   HCT 36.6 12/23/2022   MCV 89.1 12/23/2022   PLT 243 12/23/2022   Lab Results  Component Value Date   CREATININE 0.79 12/24/2022   BUN 24 (H) 12/24/2022   NA 137 12/24/2022   K 3.6 12/24/2022   CL 101 12/24/2022   CO2 25 12/24/2022   Lab Results  Component Value Date   ALT 11 06/29/2022   AST 21 06/29/2022   ALKPHOS 77 06/29/2022   BILITOT 0.3 06/29/2022   Lab Results  Component Value Date   CHOL 224 (H) 06/28/2019   HDL 103 06/28/2019   LDLCALC 105 (H) 06/28/2019   LDLDIRECT 143.0 10/18/2007   TRIG 95 06/28/2019   CHOLHDL 2.2 06/28/2019    Lab Results  Component Value Date   HGBA1C 5.4 06/28/2019    Assessment & Plan   1. Atrial tachycardia: Recent hospitalization in the setting of COPD exacerbation, acute bronchitis.  She was noted to have possible atrial fibrillation versus multifocal  atrial tachycardia with rapid rate.  Echo as below.  Anticoagulation was deferred.  She was started on diltiazem.  ZIO monitor placed, results pending.  She denies any recent palpitations.  Discussed ED precautions.  Continue diltiazem.  2. NICM: Most recent echo showed EF 60 to 65%, normal LV function, no RWMA, G1 DD, normal RV systolic function, mild mitral valve regurgitation.  Euvolemic and well compensated on exam.   3. COPD: Stable chronic dyspnea, on 3 L home O2 continuous.  Follows with pulmonology.   4. Disposition: Follow-up in 6-8 weeks.       Joylene Grapes, NP 01/22/2023, 10:40 AM

## 2023-01-24 DIAGNOSIS — J440 Chronic obstructive pulmonary disease with acute lower respiratory infection: Secondary | ICD-10-CM | POA: Diagnosis not present

## 2023-01-24 DIAGNOSIS — E876 Hypokalemia: Secondary | ICD-10-CM | POA: Diagnosis not present

## 2023-01-24 DIAGNOSIS — I4891 Unspecified atrial fibrillation: Secondary | ICD-10-CM | POA: Diagnosis not present

## 2023-01-24 DIAGNOSIS — Z8679 Personal history of other diseases of the circulatory system: Secondary | ICD-10-CM | POA: Diagnosis not present

## 2023-01-26 ENCOUNTER — Ambulatory Visit: Payer: Medicare Other | Admitting: Nurse Practitioner

## 2023-02-11 DIAGNOSIS — I4719 Other supraventricular tachycardia: Secondary | ICD-10-CM | POA: Diagnosis not present

## 2023-02-12 ENCOUNTER — Telehealth: Payer: Self-pay | Admitting: Physician Assistant

## 2023-02-12 ENCOUNTER — Other Ambulatory Visit: Payer: Self-pay

## 2023-02-12 ENCOUNTER — Ambulatory Visit: Payer: Medicare Other | Attending: Physician Assistant

## 2023-02-12 DIAGNOSIS — I4719 Other supraventricular tachycardia: Secondary | ICD-10-CM

## 2023-02-12 DIAGNOSIS — Z79899 Other long term (current) drug therapy: Secondary | ICD-10-CM

## 2023-02-12 DIAGNOSIS — I471 Supraventricular tachycardia, unspecified: Secondary | ICD-10-CM

## 2023-02-12 MED ORDER — DILTIAZEM HCL ER COATED BEADS 120 MG PO CP24: 120.0000 mg | ORAL_CAPSULE | Freq: Every day | ORAL | 3 refills | Status: DC

## 2023-02-12 NOTE — Telephone Encounter (Signed)
Laurann Montana, PA-C  to Me  Cv Div [facility] Triage     02/12/23  1:32 PM Yes please! Let's start the Toprol at low dose 12.5mg  daily, while continuing the diltiazem 120mg  daily. She has a history of softer BP at times and since she was still having breakthrough SVT on the diltiazem, I wanted to trial addition of Toprol. Thank you________________________________________________   Called patient.  She said she will need a refill of cardizem 120.  She only took it for 30 days.  There were no refills so she did not worry about it.      She checked her bp at home this morning, but up until now only would get it checked when she goes to the doctor.   I talked to her about the Toprol XL but wanted to clarify w Dayna now since learning she is not even on the diltiazem at this point.  Pt aware I will call her back.

## 2023-02-12 NOTE — Telephone Encounter (Signed)
Rachael Montana, PA-C  You15 minutes ago (3:31 PM)   Discussed with Dr. Bjorn Pippin (he had seen pt in hospital with me). The monitor came back in 2 parts: 5/1 - 12/31/22 (19 hours of data) showed avg 70, 15 runs SVT (max 7 beats) then 5/24-6/7 (13 days 16 hours) showing avg 79bpm, 183 runs of SVT max lasting 5 minutes  Presumably for the 2nd one she was NOT on diltiazem at the time when she was having longer runs. Recommend restart diltiazem CD 120mg  daily and repeat Zio for 1 week to evaluate SVT burden (non-live). Please have her report her BP within a few days of restarting diltiazem to ensure stable.  Thank you!  __________________________________________________________   [name] the patient. Adv to restart the diltiazem 120 mg daily.  Adv to monitor BP while on medication to make sure that her BP remains stable.  Adv to notify if getting readings <100 SBP.  She voices understanding.  Also adv of plan for her to wear another monitor for one week.  Asked her to put it on after 10-14 days of being back on diltiazem.  --apply it by June 28.  She voices understanding and agreement.

## 2023-02-12 NOTE — Telephone Encounter (Signed)
Pt calling back after speak to nurse about her monitor results. She states her bp today is 120/65. She is also wants to know if she should continue take diltiazem given to her at the hospital. Please advise.

## 2023-02-12 NOTE — Progress Notes (Unsigned)
Enrolled patient for a 7 day Zio XT Monitor to be mailed to patients home  [place] to read

## 2023-02-15 ENCOUNTER — Ambulatory Visit: Payer: Medicare Other | Attending: Physician Assistant

## 2023-02-15 DIAGNOSIS — I4719 Other supraventricular tachycardia: Secondary | ICD-10-CM

## 2023-02-15 DIAGNOSIS — Z79899 Other long term (current) drug therapy: Secondary | ICD-10-CM | POA: Diagnosis not present

## 2023-02-16 ENCOUNTER — Telehealth: Payer: Self-pay

## 2023-02-16 LAB — BASIC METABOLIC PANEL
BUN/Creatinine Ratio: 21 (ref 12–28)
BUN: 15 mg/dL (ref 8–27)
CO2: 30 mmol/L — ABNORMAL HIGH (ref 20–29)
Calcium: 9.3 mg/dL (ref 8.7–10.3)
Chloride: 99 mmol/L (ref 96–106)
Creatinine, Ser: 0.7 mg/dL (ref 0.57–1.00)
Glucose: 83 mg/dL (ref 70–99)
Potassium: 4.3 mmol/L (ref 3.5–5.2)
Sodium: 140 mmol/L (ref 134–144)
eGFR: 92 mL/min/{1.73_m2} (ref >59)

## 2023-02-16 LAB — MAGNESIUM: Magnesium: 1.8 mg/dL (ref 1.6–2.3)

## 2023-02-16 NOTE — Telephone Encounter (Signed)
-----   Message from Laurann Montana, [place] sent at 02/16/2023  7:45 AM EDT ----- Please let pt know labs stable except magnesium slightly below goal of 2.0. Recommend start rx Mag Ox 400mg  BID or Mag glycinate 200mg  BID (OTC) and recheck Mg level in about 10 days, make sure to take suppplement a few hours before taking that day. Please increase dietary intake of healthy sources of magnesium including leafy greens, nuts, seeds, fish, beans, whole grains, avocados, yogurt,if not allergic to these things.

## 2023-02-16 NOTE — Telephone Encounter (Signed)
Left voicemail to return call to office.

## 2023-02-17 ENCOUNTER — Other Ambulatory Visit: Payer: Self-pay | Admitting: Family Medicine

## 2023-02-17 DIAGNOSIS — Z0000 Encounter for general adult medical examination without abnormal findings: Secondary | ICD-10-CM

## 2023-02-17 NOTE — Progress Notes (Signed)
HPI- F former smoker (30 pk yrs) followed for COPD, complicated by Dilated Cardiomyopathy, COPD, IBS, GERD, Degenerative Disc Disease, Depression, Hx Breast Cancer/ Bilat mastectomy, no XRT, Chemo stopped due to cardiomyopathy, Fibromyalgia, PFT 03/31/17- Severe obstruction, Response to BD, DLCO mod reduced. FVC 2.20/ 83%, FEV1 1.06/ 52%, R 0.48, TLC 125%, DLCO 49%,   ============================================================================== 04/21/22- 70 yoF former smoker (30 pk yrs) followed for COPD, complicated by Dilated Cardiomyopathy, COPD, IBS, GERD, Degenerative Disc Disease, Depression, Hx Breast Cancer/ Bilat mastectomy, no XRT, Chemo stopped due to cardiomyopathy, Fibromyalgia,  --Proair hfa, Breztri> Incruse -----Pt f/u for COPD some SOB, and wheezing. No coughing to report. Inhalers are working, no questions or concerns. She feels Incruse inhaler working fine for her.  No acute respiratory issues, routine cough or wheeze. Describes orthostatic lightheadedness if she gets a little dehydrated.  Discussed this especially in current summer heat.  02/19/23- 71 yoF former smoker (30 pk yrs) followed for COPD, complicated by Dilated Cardiomyopathy, AFib,  COPD, IBS, GERD, Degenerative Disc Disease, Depression, Hx Breast Cancer/ Bilat mastectomy, no XRT, Chemo stopped due to cardiomyopathy, Fibromyalgia,  O2 3L  started in hosp for exac COPD in April --Proair hfa, Breztri> Incruse -----Breathing has been ok SOB  On oxygen concentrator at night.  Using E tanks for portable oxygen.  We discussed lighter portable systems. She needs refills for Ventolin and Incruse inhalers which do seem to help. CXR 12/01/22-  IMPRESSION: Hyperinflation.  No acute cardiopulmonary disease   ROS-see HPI   + = positive Constitutional:    weight loss, night sweats, fevers, chills, fatigue, lassitude. HEENT:    headaches, difficulty swallowing, tooth/dental problems, sore throat,       sneezing, itching, ear  ache, nasal congestion, post nasal drip, snoring CV:    chest pain, orthopnea, PND, swelling in lower extremities, anasarca,                                   dizziness, palpitations Resp:  + shortness of breath with exertion or at rest.                productive cough,   non-productive cough, coughing up of blood.              change in color of mucus.  wheezing.   Skin:    rash or lesions. GI:  No-   heartburn, indigestion, abdominal pain, nausea, vomiting, diarrhea,                 change in bowel habits, loss of appetite GU: dysuria, change in color of urine, no urgency or frequency.   flank pain. MS:   joint pain, stiffness, decreased range of motion, back pain. Neuro-     nothing unusual Psych:  change in mood or affect.  depression or anxiety.   memory loss.  OBJ- Physical Exam  O2 2L tank General- Alert, Oriented, Affect-appropriate, Distress- none acute, +petite Skin- rash-none, lesions- none, excoriation- none Lymphadenopathy- none Head- atraumatic            Eyes- Gross vision intact, PERRLA, conjunctivae and secretions clear            Ears- Hearing, canals-normal            Nose- Clear, no-Septal dev, mucus, polyps, erosion, perforation             Throat- Mallampati II-III , mucosa clear , drainage- none,  tonsils- atrophic Neck- flexible , trachea midline, no stridor , thyroid nl, carotid no bruit Chest - symmetrical excursion , unlabored           Heart/CV- RRR , no murmur , no gallop  , no rub, nl s1 s2                           - JVD- none , edema- none, stasis changes- none, varices- none           Lung- +coarse in bases/ distant, wheeze- none, cough- none , dullness-none, rub- none           Chest wall- +bilateral reconstruction after breast cancer Abd-  Br/ Gen/ Rectal- Not done, not indicated Extrem- cyanosis- none, clubbing, none, atrophy- none, strength- nl Neuro- grossly intact to observation

## 2023-02-18 ENCOUNTER — Other Ambulatory Visit: Payer: Self-pay | Admitting: Family Medicine

## 2023-02-18 DIAGNOSIS — E538 Deficiency of other specified B group vitamins: Secondary | ICD-10-CM

## 2023-02-18 DIAGNOSIS — R000 Tachycardia, unspecified: Secondary | ICD-10-CM

## 2023-02-18 DIAGNOSIS — E559 Vitamin D deficiency, unspecified: Secondary | ICD-10-CM

## 2023-02-18 DIAGNOSIS — Z0000 Encounter for general adult medical examination without abnormal findings: Secondary | ICD-10-CM

## 2023-02-19 ENCOUNTER — Telehealth (HOSPITAL_COMMUNITY): Payer: Self-pay

## 2023-02-19 ENCOUNTER — Encounter: Payer: Self-pay | Admitting: Internal Medicine

## 2023-02-19 ENCOUNTER — Ambulatory Visit: Payer: Medicare Other | Admitting: Internal Medicine

## 2023-02-19 VITALS — BP 104/62 | HR 70 | Ht 60.0 in | Wt 87.8 lb

## 2023-02-19 DIAGNOSIS — J9611 Chronic respiratory failure with hypoxia: Secondary | ICD-10-CM | POA: Insufficient documentation

## 2023-02-19 DIAGNOSIS — J449 Chronic obstructive pulmonary disease, unspecified: Secondary | ICD-10-CM | POA: Diagnosis not present

## 2023-02-19 MED ORDER — UMECLIDINIUM BROMIDE 62.5 MCG/ACT IN AEPB: 1.0000 | INHALATION_SPRAY | Freq: Every day | RESPIRATORY_TRACT | 12 refills | Status: AC

## 2023-02-19 MED ORDER — ALBUTEROL SULFATE HFA 108 (90 BASE) MCG/ACT IN AERS: INHALATION_SPRAY | RESPIRATORY_TRACT | 12 refills | Status: AC

## 2023-02-19 NOTE — Assessment & Plan Note (Signed)
Home O2 concentrator provided by [employer] at hospitalization April, 2024.  We will ask [employer] for light portable system.  She may need qualifying walk test, consideration of [employer] POC etc. depending on what [employer] is able to do.

## 2023-02-19 NOTE — Patient Instructions (Addendum)
Order- DME [employer]- lightest available portable O2 2L   prefer POC      dx chronic respiratory failure with hypoxia  Order- referral to [facility]     dx COPD mixed type  We will re-order your albuterol and Incruse inhalers  Handicapped Parking form

## 2023-02-19 NOTE — Telephone Encounter (Signed)
Pt insurance is active and benefits verified through [facility] Medicare Co-pay $15, DED 0/0 met, out of pocket $3,600/$372.76 met, co-insurance 0%. no pre-authorization required, [place]/[employer] 02/19/23@11 :43, REF# [id number] No visit limit.

## 2023-02-19 NOTE — Telephone Encounter (Signed)
Called patient to see if she is interested in the Pulmonary Rehab Program. Patient expressed interest. Explained scheduling process and went over insurance, patient verbalized understanding. Someone from our pulmonary rehab staff will contact pt at a later time. 

## 2023-02-19 NOTE — Assessment & Plan Note (Signed)
Nearing baseline after exacerbation in April. Plan-refill inhalers.  Refer for pulmonary rehab for help with severe COPD.

## 2023-02-22 ENCOUNTER — Telehealth: Payer: Self-pay | Admitting: Internal Medicine

## 2023-02-22 NOTE — Telephone Encounter (Signed)
I rec'd a message from [place] with [employer] "We will need order to state the below to eval pt.  "If new O2 start,, Please evaluate and titrate for best fit POC or portable O2 system. RT to evaluate and titrate patient for POC or Homefill with OCD maintain sats >/=90%. if patient qualifies, dispense POC or homefill with OCD 1-5 pulse dose."

## 2023-02-23 ENCOUNTER — Other Ambulatory Visit: Payer: Medicare Other

## 2023-02-23 DIAGNOSIS — Z0000 Encounter for general adult medical examination without abnormal findings: Secondary | ICD-10-CM

## 2023-02-23 DIAGNOSIS — E559 Vitamin D deficiency, unspecified: Secondary | ICD-10-CM

## 2023-02-23 DIAGNOSIS — R7303 Prediabetes: Secondary | ICD-10-CM | POA: Diagnosis not present

## 2023-02-23 DIAGNOSIS — E782 Mixed hyperlipidemia: Secondary | ICD-10-CM | POA: Diagnosis not present

## 2023-02-23 DIAGNOSIS — E538 Deficiency of other specified B group vitamins: Secondary | ICD-10-CM

## 2023-02-24 ENCOUNTER — Other Ambulatory Visit: Payer: Self-pay | Admitting: *Deleted

## 2023-02-24 DIAGNOSIS — J9611 Chronic respiratory failure with hypoxia: Secondary | ICD-10-CM

## 2023-02-24 DIAGNOSIS — Z8679 Personal history of other diseases of the circulatory system: Secondary | ICD-10-CM | POA: Diagnosis not present

## 2023-02-24 DIAGNOSIS — E876 Hypokalemia: Secondary | ICD-10-CM | POA: Diagnosis not present

## 2023-02-24 DIAGNOSIS — J449 Chronic obstructive pulmonary disease, unspecified: Secondary | ICD-10-CM

## 2023-02-24 DIAGNOSIS — I4891 Unspecified atrial fibrillation: Secondary | ICD-10-CM | POA: Diagnosis not present

## 2023-02-24 DIAGNOSIS — J440 Chronic obstructive pulmonary disease with acute lower respiratory infection: Secondary | ICD-10-CM | POA: Diagnosis not present

## 2023-02-24 LAB — HEPATIC FUNCTION PANEL
ALT: 19 IU/L (ref 0–32)
AST: 31 IU/L (ref 0–40)
Albumin: 4.3 g/dL (ref 3.8–4.8)
Alkaline Phosphatase: 60 IU/L (ref 44–121)
Bilirubin Total: 0.3 mg/dL (ref 0.0–1.2)
Bilirubin, Direct: <0.1 mg/dL (ref 0.00–0.40)
Total Protein: 6.3 g/dL (ref 6.0–8.5)

## 2023-02-24 LAB — HEMOGLOBIN A1C
Est. average glucose Bld gHb Est-mCnc: 120 mg/dL
Hgb A1c MFr Bld: 5.8 % — ABNORMAL HIGH (ref 4.8–5.6)

## 2023-02-24 LAB — CBC WITH DIFFERENTIAL/PLATELET
Basophils Absolute: 0.1 10*3/uL (ref 0.0–0.2)
Basos: 1 %
EOS (ABSOLUTE): 0.1 10*3/uL (ref 0.0–0.4)
Eos: 3 %
Hematocrit: 32.7 % — ABNORMAL LOW (ref 34.0–46.6)
Hemoglobin: 10.4 g/dL — ABNORMAL LOW (ref 11.1–15.9)
Immature Grans (Abs): 0 10*3/uL (ref 0.0–0.1)
Immature Granulocytes: 0 %
Lymphocytes Absolute: 1.7 10*3/uL (ref 0.7–3.1)
Lymphs: 39 %
MCH: 29.5 pg (ref 26.6–33.0)
MCHC: 31.8 g/dL (ref 31.5–35.7)
MCV: 93 fL (ref 79–97)
Monocytes Absolute: 0.5 10*3/uL (ref 0.1–0.9)
Monocytes: 13 %
Neutrophils Absolute: 1.9 10*3/uL (ref 1.4–7.0)
Neutrophils: 44 %
Platelets: 298 10*3/uL (ref 150–450)
RBC: 3.53 x10E6/uL — ABNORMAL LOW (ref 3.77–5.28)
RDW: 13.1 % (ref 11.7–15.4)
WBC: 4.3 10*3/uL (ref 3.4–10.8)

## 2023-02-24 LAB — LIPID PANEL
Chol/HDL Ratio: 2.6 ratio (ref 0.0–4.4)
Cholesterol, Total: 230 mg/dL — ABNORMAL HIGH (ref 100–199)
HDL: 89 mg/dL (ref >39)
LDL Chol Calc (NIH): 121 mg/dL — ABNORMAL HIGH (ref 0–99)
Triglycerides: 114 mg/dL (ref 0–149)
VLDL Cholesterol Cal: 20 mg/dL (ref 5–40)

## 2023-02-24 LAB — B12 AND FOLATE PANEL
Folate: >20 ng/mL (ref >3.0)
Vitamin B-12: 312 pg/mL (ref 232–1245)

## 2023-02-24 LAB — VITAMIN D 25 HYDROXY (VIT D DEFICIENCY, FRACTURES): Vit D, 25-Hydroxy: 18 ng/mL — ABNORMAL LOW (ref 30.0–100.0)

## 2023-02-24 NOTE — Telephone Encounter (Signed)
[employer] requested DME order for lightest POC order to have the following added "If new O2 start,, Please evaluate and titrate for best fit POC or portable O2 system. RT to evaluate and titrate patient for POC or Homefill with OCD maintain sats >/=90%. if patient qualifies, dispense POC or homefill with OCD 1-5 pulse dose."  With this  phrase added to order [employer] can eval patient with different portable O2 options to find best for patient.  New DME order placed with Dr. Roxy Cedar request for lightest portable O2 with additional phrase requested by [employer].  Nothing further at this time.

## 2023-03-01 ENCOUNTER — Encounter: Payer: Self-pay | Admitting: Nurse Practitioner

## 2023-03-01 ENCOUNTER — Ambulatory Visit: Payer: Medicare Other | Attending: Nurse Practitioner | Admitting: Nurse Practitioner

## 2023-03-01 VITALS — BP 100/62 | HR 71 | Ht 60.0 in | Wt 87.4 lb

## 2023-03-01 DIAGNOSIS — I471 Supraventricular tachycardia, unspecified: Secondary | ICD-10-CM

## 2023-03-01 DIAGNOSIS — J449 Chronic obstructive pulmonary disease, unspecified: Secondary | ICD-10-CM

## 2023-03-01 DIAGNOSIS — I428 Other cardiomyopathies: Secondary | ICD-10-CM | POA: Diagnosis not present

## 2023-03-01 DIAGNOSIS — I4719 Other supraventricular tachycardia: Secondary | ICD-10-CM | POA: Diagnosis not present

## 2023-03-01 NOTE — Patient Instructions (Signed)
Medication Instructions:  Your physician recommends that you continue on your current medications as directed. Please refer to the Current Medication list given to you today.  *If you need a refill on your cardiac medications before your next appointment, please call your pharmacy*   Lab Work: NONE ordered at this time of appointment     Testing/Procedures: NONE ordered at this time of appointment     Follow-Up: At [facility], you and your health needs are our priority.  As part of our continuing mission to provide you with exceptional heart care, we have created designated Provider Care Teams.  These Care Teams include your primary Cardiologist (physician) and Advanced Practice Providers (APPs -  Physician Assistants and Nurse Practitioners) who all work together to provide you with the care you need, when you need it.  We recommend signing up for the patient portal called "MyChart".  Sign up information is provided on this After Visit Summary.  MyChart is used to connect with patients for Virtual Visits (Telemedicine).  Patients are able to view lab/test results, encounter notes, upcoming appointments, etc.  Non-urgent messages can be sent to your provider as well.   To learn more about what you can do with MyChart, go to [email or web address].    Your next appointment:   3-4 month(s)  Provider:   Rollene Rotunda, MD  or [name], NP

## 2023-03-01 NOTE — Progress Notes (Signed)
Office Visit    Patient Name: [name] Date of Encounter: 03/01/2023  Primary Care Provider:  Melida Quitter, PA Primary Cardiologist:  Rollene Rotunda, MD  Chief Complaint    72 year old female with a history of NICM, SVT/atrial tachycardia, RBBB, hyperlipidemia, B12 deficiency, fibromyalgia, COPD, breast cancer, and GERD who presents for follow-up related to atrial tachycardia.   Past Medical History    Past Medical History:  Diagnosis Date   Anemia    B12 deficient   B12 deficiency    Breast cancer (HCC)    left breast.   Chronic SI joint pain    Bilateral, Dr. Corliss Skains   Fibromyalgia    Dr. Corliss Skains   GERD (gastroesophageal reflux disease)    Greater trochanteric bursitis of both hips    Dr. Corliss Skains   Hyperlipidemia    IBS (irritable bowel syndrome)    LV dysfunction    iatrogenic from chemotherapy ; on Carvedilol    Migraines    Osteoporosis    Dr Lomax---> transferring to a new gyn   Past Surgical History:  Procedure Laterality Date   ABDOMINAL HYSTERECTOMY     BSO for  Endometriosis   CARPAL TUNNEL RELEASE     left   CERVICAL DISC SURGERY     COLONOSCOPY W/ POLYPECTOMY  1999   Dr Kinnie Scales   ESOPHAGEAL DILATION  2005   Dr Kinnie Scales   MASTECTOMY  2009   bilateral, Dr Jamey Ripa   SEPTOPLASTY      Allergies  Allergies  Allergen Reactions   Amoxicillin-Pot Clavulanate     diarrhea   Doxycycline     Nausea & vomiting   Venlafaxine     ? Reaction; ? Blurred vision     Labs/Other Studies Reviewed    The following studies were reviewed today:  Cardiac Studies & Procedures       ECHOCARDIOGRAM  ECHOCARDIOGRAM COMPLETE 12/23/2022  Narrative ECHOCARDIOGRAM REPORT    Patient Name:   [name] Date of Exam: 12/23/2022 Medical Rec #:  [id number]         Height:       60.0 in Accession #:    [id number]        Weight:       80.0 lb Date of Birth:  [birth year]        BSA:          1.265 m Patient Age:    71 years          BP:            111/64 mmHg Patient Gender: F                 HR:           88 bpm. Exam Location:  Inpatient  Procedure: 2D Echo, Color Doppler and Cardiac Doppler  Indications:    Afib in ER  History:        Patient has no prior history of Echocardiogram examinations. Cardiomyopathy, COPD, Arrythmias:Atrial Fibrillation; Risk Factors:Dyslipidemia.  Sonographer:    [name] Referring Phys: [id number] [name]   Sonographer Comments: Suboptimal apical window. Image acquisition challenging due to mastectomy and Image acquisition challenging due to breast implants. IMPRESSIONS   1. Left ventricular ejection fraction, by estimation, is 60 to 65%. The left ventricle has normal function. The left ventricle has no regional wall motion abnormalities. Left ventricular diastolic parameters are consistent with Grade I diastolic dysfunction (impaired relaxation). 2. Right ventricular systolic  function is normal. The right ventricular size is normal. There is normal pulmonary artery systolic pressure. The estimated right ventricular systolic pressure is 32.6 mmHg. 3. The mitral valve is normal in structure. Mild mitral valve regurgitation. No evidence of mitral stenosis. 4. The aortic valve is normal in structure. Aortic valve regurgitation is not visualized. No aortic stenosis is present. 5. Aortic dilatation noted. There is mild dilatation of the aortic root, measuring 40 mm. 6. The inferior vena cava is normal in size with greater than 50% respiratory variability, suggesting right atrial pressure of 3 mmHg.  FINDINGS Left Ventricle: Left ventricular ejection fraction, by estimation, is 60 to 65%. The left ventricle has normal function. The left ventricle has no regional wall motion abnormalities. The left ventricular internal cavity size was normal in size. There is no left ventricular hypertrophy. Left ventricular diastolic parameters are consistent with Grade I diastolic dysfunction (impaired  relaxation). Normal left ventricular filling pressure.  Right Ventricle: The right ventricular size is normal. No increase in right ventricular wall thickness. Right ventricular systolic function is normal. There is normal pulmonary artery systolic pressure. The tricuspid regurgitant velocity is 2.72 m/s, and with an assumed right atrial pressure of 3 mmHg, the estimated right ventricular systolic pressure is 32.6 mmHg.  Left Atrium: Left atrial size was normal in size.  Right Atrium: Right atrial size was normal in size.  Pericardium: Trivial pericardial effusion is present. The pericardial effusion is anterior to the right ventricle.  Mitral Valve: The mitral valve is normal in structure. Mild mitral valve regurgitation. No evidence of mitral valve stenosis.  Tricuspid Valve: The tricuspid valve is normal in structure. Tricuspid valve regurgitation is mild . No evidence of tricuspid stenosis.  Aortic Valve: The aortic valve is normal in structure. Aortic valve regurgitation is not visualized. No aortic stenosis is present.  Pulmonic Valve: The pulmonic valve was normal in structure. Pulmonic valve regurgitation is not visualized. No evidence of pulmonic stenosis.  Aorta: Aortic dilatation noted. There is mild dilatation of the aortic root, measuring 40 mm.  Venous: The inferior vena cava is normal in size with greater than 50% respiratory variability, suggesting right atrial pressure of 3 mmHg.  IAS/Shunts: No atrial level shunt detected by color flow Doppler.   LEFT VENTRICLE PLAX 2D LVIDd:         3.40 cm   Diastology LVIDs:         1.90 cm   LV e' medial:    8.03 cm/s LV PW:         0.90 cm   LV E/e' medial:  6.1 LV IVS:        0.80 cm   LV e' lateral:   10.30 cm/s LVOT diam:     1.50 cm   LV E/e' lateral: 4.7 LVOT Area:     1.77 cm   RIGHT VENTRICLE RV S prime:     19.70 cm/s TAPSE (M-mode): 2.0 cm  LEFT ATRIUM         Index LA diam:    3.10 cm 2.45 cm/m  AORTA Ao  Root diam: 4.00 cm  MITRAL VALVE               TRICUSPID VALVE MV Area (PHT): 3.48 cm    TR Peak grad:   29.6 mmHg MV Decel Time: 218 msec    TR Vmax:        272.00 cm/s MV E velocity: 48.70 cm/s MV A velocity: 77.00 cm/s  SHUNTS MV  E/A ratio:  0.63        Systemic Diam: 1.50 cm  Armanda Magic MD Electronically signed by Armanda Magic MD Signature Date/Time: 12/23/2022/2:11:03 PM    Final    MONITORS  LONG TERM MONITOR (3-14 DAYS) 02/11/2023  Narrative Predominant rhythm is normal sinus Frequent runs of SVT with the longest lasting 5 minutes. Frequent isolated premature atrial contractions. Ventricular bigeminy is present.          Recent Labs: 12/22/2022: B Natriuretic Peptide 49.0; TSH 3.375 02/15/2023: BUN 15; Creatinine, Ser 0.70; Magnesium 1.8; Potassium 4.3; Sodium 140 02/23/2023: ALT 19; Hemoglobin 10.4; Platelets 298  Recent Lipid Panel    Component Value Date/Time   CHOL 230 (H) 02/23/2023 0920   TRIG 114 02/23/2023 0920   TRIG 78 08/12/2006 0922   HDL 89 02/23/2023 0920   CHOLHDL 2.6 02/23/2023 0920   CHOLHDL 2 04/01/2017 0958   VLDL 16.6 04/01/2017 0958   LDLCALC 121 (H) 02/23/2023 0920   LDLDIRECT 143.0 10/18/2007 0000    History of Present Illness    72 year old female with the above past medical history including NICM, SVT/atrial tachycardia, RBBB, hyperlipidemia, B12 deficiency, fibromyalgia, COPD, breast cancer, and GERD.   She was previously seen by Dr. Gala Romney in the setting of nonischemic cardiomyopathy following treatment for breast cancer, EF as low as 34%, improved to 60 to 65%.  She presented to the ED on 12/22/2022 with complaints of productive cough in the setting of COPD exacerbation, bronchitis.  Two weeks prior to her hospitalization and her husband died of an inoperable brain tumor.  She was noted to be in possible atrial fibrillation versus multifocal atrial tachycardia with rapid rate.  Cardiology was consulted.  Repeat echocardiogram showed  EF 60 to 65%, normal LV function, no RWMA, G1 DD, normal RV systolic function, mild mitral valve regurgitation.  Anticoagulation was deferred.  It was difficult to discern between atrial fibrillation and multifocal atrial tachycardia.  She was started on diltiazem.  Outpatient cardiac monitor revealed predominantly sinus rhythm, frequent runs of SVT, frequent isolated PACs, ventricular bigeminy.  It was noted that she was not taking diltiazem at time of order.  She was instructed to start taking diltiazem and repeat 7-day ZIO.  Additionally, she was referred to EP given frequent SVT.   She presents today for follow-up accompanied by her sister.  Since her last visit she has been stable from a cardiac standpoint.  She has stable chronic dyspnea.  He remains on 3 L home O2, continuous use.  She denies any significant palpitations, denies chest pain, worsening dyspnea, edema, PND, orthopnea, weight gain.  She has been taking her diltiazem as prescribed, she has not yet applied her second heart monitor.  Overall, she reports feeling well.  Home Medications    Current Outpatient Medications  Medication Sig Dispense Refill   albuterol (VENTOLIN HFA) 108 (90 Base) MCG/ACT inhaler Inhale 2 puffs every 6 hours as needed 8 g 12   cetirizine (ZYRTEC) 10 MG tablet Take 10 mg by mouth daily.     citalopram (CELEXA) 40 MG tablet 1po qd Filled by Arlana Hove-  Nurse Practitioner, [facility] (Patient taking differently: Take 40 mg by mouth daily.) 5 tablet 0   diltiazem (CARDIZEM CD) 120 MG 24 hr capsule Take 1 capsule (120 mg total) by mouth daily. 90 capsule 3   hydrOXYzine (ATARAX/VISTARIL) 10 MG tablet Take 10 mg by mouth every 6 (six) hours as needed for anxiety.     methocarbamol (ROBAXIN)  750 MG tablet TAKE 1 TABLET BY MOUTH 2 TIMES DAILY AS NEEDED FOR MUSCLE SPASMS (NON FORMULARY) (Patient taking differently: Take 750 mg by mouth every 6 (six) hours as needed for muscle spasms.) 60 tablet 2    umeclidinium bromide (INCRUSE ELLIPTA) 62.5 MCG/ACT AEPB Inhale 1 puff into the lungs daily. 30 each 12   No current facility-administered medications for this visit.     Review of Systems   She denies chest pain, palpitations, pnd, orthopnea, n, v, dizziness, syncope, edema, weight gain, or early satiety. All other systems reviewed and are otherwise negative except as noted above.     Cardiac Rehabilitation Eligibility Assessment  The patient is ready to start cardiac rehabilitation from a cardiac standpoint.    Physical Exam    VS:  BP 100/62 (BP Location: Left Arm, Patient Position: Sitting, Cuff Size: Normal)   Pulse 71   Ht 5' (1.524 m)   Wt 87 lb 6.4 oz (39.6 kg)   SpO2 96%   BMI 17.07 kg/m   GEN: Well nourished, well developed, in no acute distress. HEENT: normal. Neck: Supple, no JVD, carotid bruits, or masses. Cardiac: RRR, no murmurs, rubs, or gallops. No clubbing, cyanosis, edema.  Radials/DP/PT 2+ and equal bilaterally.  Respiratory:  Respirations regular and unlabored, clear to auscultation bilaterally. GI: Soft, nontender, nondistended, BS + x 4. MS: no deformity or atrophy. Skin: warm and dry, no rash. Neuro:  Strength and sensation are intact. Psych: Normal affect.  Accessory Clinical Findings    ECG personally reviewed by me today - EKG Interpretation Date/Time:  Monday March 01 2023 11:08:00 EDT Ventricular Rate:  71 PR Interval:  192 QRS Duration:  114 QT Interval:  440 QTC Calculation: 478 R Axis:   92  Text Interpretation: Normal sinus rhythm Rightward axis When compared with ECG of 22-Dec-2022 13:00, Sinus rhythm has replaced Atrial fibrillation Vent. rate has decreased BY  90 BPM Incomplete right bundle branch block Confirmed by [name] ([id number]) on 03/01/2023 11:20:44 AM  - no acute changes.   Lab Results  Component Value Date   WBC 4.3 02/23/2023   HGB 10.4 (L) 02/23/2023   HCT 32.7 (L) 02/23/2023   MCV 93 02/23/2023   PLT 298 02/23/2023    Lab Results  Component Value Date   CREATININE 0.70 02/15/2023   BUN 15 02/15/2023   NA 140 02/15/2023   K 4.3 02/15/2023   CL 99 02/15/2023   CO2 30 (H) 02/15/2023   Lab Results  Component Value Date   ALT 19 02/23/2023   AST 31 02/23/2023   ALKPHOS 60 02/23/2023   BILITOT 0.3 02/23/2023   Lab Results  Component Value Date   CHOL 230 (H) 02/23/2023   HDL 89 02/23/2023   LDLCALC 121 (H) 02/23/2023   LDLDIRECT 143.0 10/18/2007   TRIG 114 02/23/2023   CHOLHDL 2.6 02/23/2023    Lab Results  Component Value Date   HGBA1C 5.8 (H) 02/23/2023    Assessment & Plan    1. Atrial tachycardia/SVT: She was hospitalized in 11/2022 in the setting of COPD exacerbation, acute bronchitis.  She was noted to have possible atrial fibrillation versus multifocal atrial tachycardia with rapid rate.  Echo as below.  Anticoagulation was deferred.  She was started on diltiazem. Outpatient cardiac monitor revealed predominantly sinus rhythm, frequent runs of SVT, frequent isolated PACs, ventricular bigeminy.  She was not taking diltiazem at the time.  She was advised to resume diltiazem and repeat cardiac monitor.  Additionally, she was referred to EP.  She notes she has received phone calls from EP but has not returned their phone call to schedule an appointment. She notes she will call to schedule appt. Repeat monitor pending.  She denies any recent palpitations.  Discussed ED precautions.  Continue diltiazem.   2. NICM: Most recent echo showed EF 60 to 65%, normal LV function, no RWMA, G1 DD, normal RV systolic function, mild mitral valve regurgitation.  She has stable chronic dyspnea in the setting of COPD.  Euvolemic and well compensated on exam.    3. COPD: Stable chronic dyspnea, on 3 L home O2 continuous.  Follows with pulmonology.    4. Disposition: Follow-up in 3 to 4 months with Dr. Antoine Poche, follow-up with EP.      Joylene Grapes, NP 03/01/2023, 11:38 AM

## 2023-03-02 ENCOUNTER — Ambulatory Visit (INDEPENDENT_AMBULATORY_CARE_PROVIDER_SITE_OTHER): Payer: Medicare Other | Admitting: Family Medicine

## 2023-03-02 ENCOUNTER — Encounter: Payer: Self-pay | Admitting: Family Medicine

## 2023-03-02 VITALS — BP 110/67 | HR 78 | Resp 20 | Ht 60.0 in | Wt 87.0 lb

## 2023-03-02 DIAGNOSIS — J9611 Chronic respiratory failure with hypoxia: Secondary | ICD-10-CM

## 2023-03-02 DIAGNOSIS — F411 Generalized anxiety disorder: Secondary | ICD-10-CM

## 2023-03-02 DIAGNOSIS — J449 Chronic obstructive pulmonary disease, unspecified: Secondary | ICD-10-CM | POA: Diagnosis not present

## 2023-03-02 DIAGNOSIS — Z0000 Encounter for general adult medical examination without abnormal findings: Secondary | ICD-10-CM

## 2023-03-02 DIAGNOSIS — E559 Vitamin D deficiency, unspecified: Secondary | ICD-10-CM

## 2023-03-02 DIAGNOSIS — E43 Unspecified severe protein-calorie malnutrition: Secondary | ICD-10-CM

## 2023-03-02 DIAGNOSIS — F322 Major depressive disorder, single episode, severe without psychotic features: Secondary | ICD-10-CM

## 2023-03-02 DIAGNOSIS — Z1211 Encounter for screening for malignant neoplasm of colon: Secondary | ICD-10-CM

## 2023-03-02 DIAGNOSIS — D649 Anemia, unspecified: Secondary | ICD-10-CM | POA: Diagnosis not present

## 2023-03-02 DIAGNOSIS — F331 Major depressive disorder, recurrent, moderate: Secondary | ICD-10-CM

## 2023-03-02 MED ORDER — VITAMIN D (ERGOCALCIFEROL) 1.25 MG (50000 UNIT) PO CAPS
50000.0000 [IU] | ORAL_CAPSULE | ORAL | 0 refills | Status: DC
Start: 2023-03-02 — End: 2023-06-21

## 2023-03-02 NOTE — Assessment & Plan Note (Signed)
Continue continuous oxygen via nasal cannula, 3 L.

## 2023-03-02 NOTE — Assessment & Plan Note (Signed)
Encouraged to increase overall nutritional intake especially prioritizing protein.  Patient verbalized understanding and is agreeable to this plan.  She has already been making efforts to "eat better".

## 2023-03-02 NOTE — Progress Notes (Signed)
Office Visit Note  Patient: [name]             Date of Birth: 12/30/1950           MRN: [id number]             PCP: Melida Quitter, PA Referring: Melida Quitter, PA Visit Date: 03/16/2023 Occupation: @GUAROCC @  Subjective:  Generalized pain  History of Present Illness: [name] is a 72 y.o. female with history of osteoarthritis, degenerative disc disease and fibromyalgia.  She was admitted in April 2024 with shortness of breath and upper respiratory tract infection.  She was diagnosed with RSV and infection.  She also had new onset atrial tachyarrhythmia for which cardiology was consulted.  She developed acute hypoxic respiratory failure.  She was given a prednisone taper and also was placed on oxygen.  Oxygen was gradually tapered and she was discharged home on 3 L of oxygen per nasal cannula.  She continues to have generalized pain.  She states she has discomfort in her neck.  Continues to have lower back pain.  She complains of discomfort in her elbows, hands, knees and her feet.  She continues to have flares of fibromyalgia.  She denies insomnia currently.    Activities of Daily Living:  Patient reports morning stiffness for 1 hour.   Patient Reports nocturnal pain.  Difficulty dressing/grooming: Denies Difficulty climbing stairs: Denies Difficulty getting out of chair: Reports Difficulty using hands for taps, buttons, cutlery, and/or writing: Reports  Review of Systems  Constitutional:  Positive for fatigue.  HENT:  Positive for mouth dryness. Negative for mouth sores.   Eyes:  Negative for dryness.  Respiratory:  Positive for shortness of breath.   Cardiovascular:  Negative for chest pain and palpitations.  Gastrointestinal:  Negative for blood in stool, constipation and diarrhea.  Endocrine: Negative for increased urination.  Genitourinary:  Positive for involuntary urination.  Musculoskeletal:  Positive for joint pain, gait problem, joint pain,  myalgias, muscle weakness, morning stiffness, muscle tenderness and myalgias. Negative for joint swelling.  Skin:  Negative for color change, rash, hair loss and sensitivity to sunlight.  Allergic/Immunologic: Negative for susceptible to infections.  Neurological:  Positive for dizziness and headaches.  Hematological:  Negative for swollen glands.  Psychiatric/Behavioral:  Negative for depressed mood and sleep disturbance. The patient is not nervous/anxious.     PMFS History:  Patient Active Problem List   Diagnosis Date Noted   Chronic respiratory failure with hypoxia (HCC) 02/19/2023   Protein-calorie malnutrition, severe (HCC) 12/24/2022   Tachycardia 12/23/2022   Tachyarrhythmia 12/22/2022   Hypokalemia 12/22/2022   Nasal congestion 09/06/2020   Estrogen deficiency 06/28/2019   Osteopenia after menopause 11/06/2017   MDD (major depressive disorder), recurrent episode, moderate (HCC) 09/22/2017   Suicide attempt (HCC)    B12 deficiency 08/03/2017   Other insomnia 11/23/2016   DDD (degenerative disc disease), cervical 11/23/2016   DDD (degenerative disc disease), lumbar 11/23/2016   Other idiopathic scoliosis, lumbar region 11/23/2016   COPD mixed type (HCC) 11/20/2013   Vitamin D deficiency 07/06/2011   CARDIOMYOPATHY, PRIMARY, DILATED 01/01/2009   Malignant tumor of breast (HCC) 07/31/2008   h/o HYPERLIPIDEMIA 10/18/2007   GERD 10/18/2007   GAD (generalized anxiety disorder) 10/12/2007   IBS 10/12/2007   Primary osteoarthritis of both hands 10/12/2007   Osteoporosis 10/12/2007   COLONIC POLYPS, HX OF 10/12/2007   MIGRAINES, HX OF 10/12/2007   Fibromyalgia--Dr. Victory Dakin, on Ultram 07/14/2007    Past  Medical History:  Diagnosis Date   Anemia    B12 deficient   B12 deficiency    Breast cancer (HCC)    left breast.   Chronic SI joint pain    Bilateral, Dr. Corliss Skains   Fibromyalgia    Dr. Corliss Skains   GERD (gastroesophageal reflux disease)    Greater trochanteric  bursitis of both hips    Dr. Corliss Skains   Hyperlipidemia    IBS (irritable bowel syndrome)    LV dysfunction    iatrogenic from chemotherapy ; on Carvedilol    Migraines    Osteoporosis    Dr Lomax---> transferring to a new gyn    Family History  Problem Relation Age of Onset   Diabetes Mother    Breast cancer Mother    Depression Mother        anxiety   Heart disease Mother        in her 81s   Colon cancer Sister    Healthy Daughter    Stroke Neg Hx    Past Surgical History:  Procedure Laterality Date   ABDOMINAL HYSTERECTOMY     BSO for  Endometriosis   CARPAL TUNNEL RELEASE     left   CERVICAL DISC SURGERY     COLONOSCOPY W/ POLYPECTOMY  1999   Dr Kinnie Scales   ESOPHAGEAL DILATION  2005   Dr Kinnie Scales   MASTECTOMY  2009   bilateral, Dr Leafy Ro     Social History   Social History Narrative   Lives w/ husband   Immunization History  Administered Date(s) Administered   Influenza Whole 07/14/2007, 05/31/2012   Influenza, High Dose Seasonal PF 07/18/2018   PFIZER(Purple Top)SARS-COV-2 Vaccination 10/30/2019, 11/26/2019   Td 04/01/1998   Tdap 10/29/2011     Objective: Vital Signs: BP 98/62 (BP Location: Left Arm, Patient Position: Sitting, Cuff Size: Normal)   Pulse 74   Resp 14   Ht 5' (1.524 m)   Wt 88 lb (39.9 kg)   BMI 17.19 kg/m    Physical Exam Vitals and nursing note reviewed.  Constitutional:      Appearance: She is well-developed.  HENT:     Head: Normocephalic and atraumatic.  Eyes:     Conjunctiva/sclera: Conjunctivae normal.  Cardiovascular:     Rate and Rhythm: Normal rate and regular rhythm.     Heart sounds: Normal heart sounds.  Pulmonary:     Effort: Pulmonary effort is normal.     Breath sounds: Normal breath sounds.  Abdominal:     General: Bowel sounds are normal.     Palpations: Abdomen is soft.  Musculoskeletal:     Cervical back: Normal range of motion.  Lymphadenopathy:     Cervical: No cervical adenopathy.   Skin:    General: Skin is warm and dry.     Capillary Refill: Capillary refill takes less than 2 seconds.  Neurological:     Mental Status: She is alert and oriented to person, place, and time.  Psychiatric:        Behavior: Behavior normal.      Musculoskeletal Exam: She had limited painful range of motion of the cervical spine.  She had bilateral trapezius spasm.  She had discomfort range of motion of the lumbar spine.  Shoulders, elbows, wrist joints were in good range of motion.  Bilateral PIP and DIP thickening with no synovitis was noted.  She had mild tenderness over trochanteric bursa.  Knee joints in good range of motion without  any warmth swelling or effusion.  There was no tenderness over ankles or MTPs.  CDAI Exam: CDAI Score: -- Patient Global: --; Provider Global: -- Swollen: --; Tender: -- Joint Exam 03/16/2023   No joint exam has been documented for this visit   There is currently no information documented on the homunculus. Go to the Rheumatology activity and complete the homunculus joint exam.  Investigation: No additional findings.  Imaging: No results found.  Recent Labs: Lab Results  Component Value Date   WBC 5.9 03/02/2023   HGB 10.8 (L) 03/02/2023   PLT 298 03/02/2023   NA 140 02/15/2023   K 4.3 02/15/2023   CL 99 02/15/2023   CO2 30 (H) 02/15/2023   GLUCOSE 83 02/15/2023   BUN 15 02/15/2023   CREATININE 0.70 02/15/2023   BILITOT 0.3 02/23/2023   ALKPHOS 60 02/23/2023   AST 31 02/23/2023   ALT 19 02/23/2023   PROT 6.3 02/23/2023   ALBUMIN 4.3 02/23/2023   CALCIUM 9.3 02/15/2023   GFRAA 80 06/28/2019    Speciality Comments: Narcotic Agreement Broken- [place] found on last UDS.   Procedures:  Trigger Point Inj  Date/Time: 03/16/2023 1:23 PM  Performed by: Pollyann Savoy, MD Authorized by: Pollyann Savoy, MD   Consent Given by:  Patient Site marked: the procedure site was marked   Timeout: prior to procedure the correct  patient, procedure, and site was verified   Indications:  Muscle spasm and pain Total # of Trigger Points:  2 Location: neck   Needle Size:  27 G Approach:  Dorsal Medications #1:  0.5 mL lidocaine 1 %; 10 mg triamcinolone acetonide 40 MG/ML Medications #2:  0.5 mL lidocaine 1 %; 10 mg triamcinolone acetonide 40 MG/ML Patient tolerance:  Patient tolerated the procedure well with no immediate complications  Allergies: Amoxicillin-pot clavulanate, Doxycycline, and Venlafaxine   Assessment / Plan:     Visit Diagnoses: Trapezius muscle spasm she continues to have bilateral trapezius spasm.  She request cortisone injections.  After informed consent was obtained bilateral trapezius area were injected with lidocaine and Kenalog as described above.  Patient tolerated the procedure well.  Postprocedure instructions were given.  DDD (degenerative disc disease), cervical-she continues to have discomfort in the cervical region.  A handout on exercises was given.  DDD (degenerative disc disease), lumbar-she has chronic discomfort.  History of scoliosis  Primary osteoarthritis of both hands-she had bilateral mild PIP and DIP thickening.  No synovitis was noted.  Lateral epicondylitis of both elbows-she gets intermittent pain.  Trochanteric bursitis of both hips-she had bilateral trochanteric bursa discomfort.  IT band stretches were discussed.  Chronic pain of left knee-no warmth swelling or effusion was noted.  Other insomnia-improved per patient.  Fibromyalgia-she continues to have generalized pain and discomfort.  Age-related osteoporosis without current pathological fracture - DEXA on 01/01/2020: The BMD measured at Femur Neck Right is 0.609 g/cm2 with a T-score of -3.1.  I advised repeat DEXA scan which she declined.  She declined treatment for osteoporosis.  History of vitamin D deficiency-vitamin D was low at 44 on February 23, 2023.  She is currently on vitamin D supplement.  Other medical  problems listed as follows:  History of gastroesophageal reflux (GERD)  History of hyperlipidemia  History of IBS  History of migraine  History of COPD-she is on 3 L of oxygen per nasal cannula.  She was hospitalized in April 2 weeks after her husband passed away for RSV infection.  She was discharged on oxygen  due to respiratory failure.  She is followed by Dr. Maple Hudson.  History of anxiety  History of depression  History of breast cancer-she had bilateral mastectomy, chemotherapy was a stopped due to cardiomyopathy.  She had no radiation therapy.  Orders: Orders Placed This Encounter  Procedures   Trigger Point Inj   No orders of the defined types were placed in this encounter.    Follow-Up Instructions: Return in about 4 months (around 07/17/2023) for OA, FMS.   Pollyann Savoy, MD  Note - This record has been created using Animal nutritionist.  Chart creation errors have been sought, but may not always  have been located. Such creation errors do not reflect on  the standard of medical care.

## 2023-03-02 NOTE — Assessment & Plan Note (Signed)
Continue following with pulmonology.  Continue pulmonary rehab, Ellipta inhaler, Ventolin rescue inhaler.

## 2023-03-02 NOTE — Assessment & Plan Note (Signed)
Start prescription weekly vitamin D supplement.  After 12-week course, switch to over-the-counter vitamin D2 1000 unit daily supplement.

## 2023-03-02 NOTE — Patient Instructions (Addendum)
After you finish the prescription strength vitamin D weekly doses, switch to an over the counter vitamin D 2000 unit daily supplement.  AT THE PHARMACY: -Due for tetanus booster (Tdap) -Due for PCV20 pneumonia vaccine and Shingles vaccine if you decide to do it to protect yourself :)

## 2023-03-02 NOTE — Assessment & Plan Note (Signed)
Stable, continue Celexa 40 mg daily.  Will continue to monitor.

## 2023-03-02 NOTE — Assessment & Plan Note (Signed)
Stable, continue Celexa 40 mg daily.

## 2023-03-02 NOTE — Progress Notes (Signed)
Complete physical exam  Patient: [name]   DOB: Apr 14, 1951   72 y.o. Female  MRN: [id number]  Subjective:    Chief Complaint  Patient presents with   Annual Exam    [name] is a 72 y.o. female who presents today for a complete physical exam. She reports consuming a general diet, she is trying to eat better.  She generally feels well. She reports sleeping well. She does not have additional problems to discuss today.    Most recent fall risk assessment:    06/28/2019   10:15 AM  Fall Risk   Falls in the past year? 0  Number falls in past yr: 0  Injury with Fall? 0  Follow up Falls evaluation completed     Most recent depression and anxiety screenings:    03/02/2023   10:19 AM 12/31/2022    2:29 PM  PHQ 2/9 Scores  PHQ - 2 Score 0 4  PHQ- 9 Score 3 12      03/02/2023   10:22 AM 12/31/2022    2:30 PM 09/21/2019    9:50 AM 04/17/2019    4:22 PM  GAD 7 : Generalized Anxiety Score  Nervous, Anxious, on Edge 0 1 1 0  Control/stop worrying 0 0 0 1  Worry too much - different things 0 0 1 1  Trouble relaxing 1 2 3 3   Restless 0 0 1 0  Easily annoyed or irritable 1 3 1 1   Afraid - awful might happen 1 0 3 0  Total GAD 7 Score 3 6 10 6   Anxiety Difficulty Not difficult at all Very difficult Not difficult at all Not difficult at all    Patient Active Problem List   Diagnosis Date Noted   Chronic respiratory failure with hypoxia (HCC) 02/19/2023   Protein-calorie malnutrition, severe (HCC) 12/24/2022   Tachycardia 12/23/2022   Tachyarrhythmia 12/22/2022   Hypokalemia 12/22/2022   Nasal congestion 09/06/2020   Estrogen deficiency 06/28/2019   Osteopenia after menopause 11/06/2017   MDD (major depressive disorder), recurrent episode, moderate (HCC) 09/22/2017   Suicide attempt (HCC)    B12 deficiency 08/03/2017   Other insomnia 11/23/2016   DDD (degenerative disc disease), cervical 11/23/2016   DDD (degenerative disc disease), lumbar 11/23/2016   Other  idiopathic scoliosis, lumbar region 11/23/2016   COPD mixed type (HCC) 11/20/2013   Vitamin D deficiency 07/06/2011   CARDIOMYOPATHY, PRIMARY, DILATED 01/01/2009   Malignant tumor of breast (HCC) 07/31/2008   h/o HYPERLIPIDEMIA 10/18/2007   GERD 10/18/2007   GAD (generalized anxiety disorder) 10/12/2007   IBS 10/12/2007   Primary osteoarthritis of both hands 10/12/2007   Osteoporosis 10/12/2007   COLONIC POLYPS, HX OF 10/12/2007   MIGRAINES, HX OF 10/12/2007   Fibromyalgia--Dr. Victory Dakin, on Ultram 07/14/2007    Past Surgical History:  Procedure Laterality Date   ABDOMINAL HYSTERECTOMY     BSO for  Endometriosis   CARPAL TUNNEL RELEASE     left   CERVICAL DISC SURGERY     COLONOSCOPY W/ POLYPECTOMY  1999   Dr Kinnie Scales   ESOPHAGEAL DILATION  2005   Dr Kinnie Scales   MASTECTOMY  2009   bilateral, Dr Jamey Ripa   SEPTOPLASTY     Social History   Tobacco Use   Smoking status: Former    Packs/day: 1.00    Years: 30.00    Additional pack years: 0.00    Total pack years: 30.00    Types: Cigarettes    Quit date:  08/31/1990    Years since quitting: 32.5    Passive exposure: Never   Smokeless tobacco: Never  Vaping Use   Vaping Use: Never used  Substance Use Topics   Alcohol use: No   Drug use: Not Currently    Types: Marijuana    Comment: 08/01/2017 last used    Family History  Problem Relation Age of Onset   Diabetes Mother    Breast cancer Mother    Depression Mother        anxiety   Heart disease Mother        in her 33s   Colon cancer Sister    Healthy Daughter    Stroke Neg Hx    Allergies  Allergen Reactions   Amoxicillin-Pot Clavulanate     diarrhea   Doxycycline     Nausea & vomiting   Venlafaxine     ? Reaction; ? Blurred vision     Patient Care Team: Melida Quitter, PA as PCP - General (Family Medicine) Rollene Rotunda, MD as PCP - Cardiology (Cardiology) Pollyann Savoy, MD as Consulting Physician (Rheumatology) Jean Rosenthal, NP (Inactive)  as Nurse Practitioner (Gynecology) Sharrell Ku, MD as Consulting Physician (Gastroenterology) Cherlyn Roberts, MD as Consulting Physician (Dermatology) Waymon Budge, MD as Consulting Physician (Pulmonary Disease) Vesta Mixer (Psychiatry) Waymon Budge, MD as Consulting Physician (Pulmonary Disease)   Outpatient Medications Prior to Visit  Medication Sig   albuterol (VENTOLIN HFA) 108 (90 Base) MCG/ACT inhaler Inhale 2 puffs every 6 hours as needed   cetirizine (ZYRTEC) 10 MG tablet Take 10 mg by mouth daily.   citalopram (CELEXA) 40 MG tablet 1po qd Filled by Arlana Hove-  Nurse Practitioner, [facility] Psych[facility] (Patient taking differently: Take 40 mg by mouth daily.)   diltiazem (CARDIZEM CD) 120 MG 24 hr capsule Take 1 capsule (120 mg total) by mouth daily.   hydrOXYzine (ATARAX/VISTARIL) 10 MG tablet Take 10 mg by mouth every 6 (six) hours as needed for anxiety.   methocarbamol (ROBAXIN) 750 MG tablet TAKE 1 TABLET BY MOUTH 2 TIMES DAILY AS NEEDED FOR MUSCLE SPASMS (NON FORMULARY) (Patient taking differently: Take 750 mg by mouth every 6 (six) hours as needed for muscle spasms.)   umeclidinium bromide (INCRUSE ELLIPTA) 62.5 MCG/ACT AEPB Inhale 1 puff into the lungs daily.   No facility-administered medications prior to visit.    Review of Systems  Constitutional:  Negative for chills, fever and malaise/fatigue.  HENT:  Negative for congestion and hearing loss.   Eyes:  Negative for blurred vision and double vision.  Respiratory:  Negative for cough and shortness of breath.   Cardiovascular:  Negative for chest pain, palpitations and leg swelling.  Gastrointestinal:  Negative for abdominal pain, constipation, diarrhea and heartburn.  Genitourinary:  Negative for frequency and urgency.  Musculoskeletal:  Negative for myalgias and neck pain.  Neurological:  Negative for headaches.  Endo/Heme/Allergies:  Negative for polydipsia.  Psychiatric/Behavioral:  Negative for  depression. The patient is not nervous/anxious and does not have insomnia.       Objective:    BP 110/67 (BP Location: Left Arm, Patient Position: Sitting, Cuff Size: Small)   Pulse 78   Resp 20   Ht 5' (1.524 m)   Wt 87 lb (39.5 kg)   SpO2 100%   BMI 16.99 kg/m    Physical Exam Constitutional:      General: She is not in acute distress.    Appearance: Normal appearance.  HENT:  Head: Normocephalic and atraumatic.     Right Ear: Tympanic membrane, ear canal and external ear normal.     Left Ear: Tympanic membrane, ear canal and external ear normal.     Ears:     Comments: Right ear hearing aid    Nose: Nose normal.     Mouth/Throat:     Mouth: Mucous membranes are moist.     Pharynx: No oropharyngeal exudate or posterior oropharyngeal erythema.  Eyes:     Extraocular Movements: Extraocular movements intact.     Conjunctiva/sclera: Conjunctivae normal.     Pupils: Pupils are equal, round, and reactive to light.     Comments: Wearing glasses, needs to schedule appt to update  Neck:     Thyroid: No thyromegaly or thyroid tenderness.  Cardiovascular:     Rate and Rhythm: Normal rate and regular rhythm.     Heart sounds: Normal heart sounds. No murmur heard.    No friction rub. No gallop.  Pulmonary:     Effort: Pulmonary effort is normal. No respiratory distress.     Breath sounds: Normal breath sounds. No wheezing, rhonchi or rales.  Abdominal:     General: Abdomen is flat. Bowel sounds are normal. There is no distension.     Palpations: There is no mass.     Tenderness: There is no abdominal tenderness.     Hernia: A hernia (small left inguinal) is present.  Musculoskeletal:        General: No swelling. Normal range of motion.     Cervical back: Normal range of motion and neck supple.  Lymphadenopathy:     Cervical: No cervical adenopathy.  Skin:    General: Skin is warm and dry.  Neurological:     Mental Status: She is alert and oriented to person, place, and  time.     Cranial Nerves: No cranial nerve deficit.     Deep Tendon Reflexes: Reflexes normal.  Psychiatric:        Mood and Affect: Mood normal.       Assessment & Plan:    Routine Health Maintenance and Physical Exam  Immunization History  Administered Date(s) Administered   Influenza Whole 07/14/2007, 05/31/2012   Influenza, High Dose Seasonal PF 07/18/2018   PFIZER(Purple Top)SARS-COV-2 Vaccination 10/30/2019, 11/26/2019   Td 04/01/1998   Tdap 10/29/2011    Health Maintenance  Topic Date Due   Zoster Vaccines- Shingrix (1 of 2) Never done   Pneumonia Vaccine 29+ Years old (1 of 1 - PCV) Never done   COVID-19 Vaccine (3 - Pfizer risk series) 12/24/2019   Medicare Annual Wellness (AWV)  06/27/2020   DTaP/Tdap/Td (3 - Td or Tdap) 10/28/2021   Colonoscopy  02/21/2023   INFLUENZA VACCINE  04/01/2023   DEXA SCAN  Completed   Hepatitis C Screening  Completed   HPV VACCINES  Aged Out   Reviewed recent lab work including low vitamin D and hemoglobin.  Starting prescription vitamin D for 12 weeks before switching to over-the-counter supplement, checking anemia profile today to search for cause.  We will continue to monitor cholesterol and A1c.  Discussed health benefits of physical activity, and encouraged her to engage in regular exercise appropriate for her age and condition.  Wellness examination  Normocytic anemia -     Anemia Profile B; Future  Chronic respiratory failure with hypoxia (HCC) Assessment & Plan: Continue continuous oxygen via nasal cannula, 3 L.   Vitamin D deficiency Assessment & Plan: Start prescription weekly vitamin  D supplement.  After 12-week course, switch to over-the-counter vitamin D2 1000 unit daily supplement.   COPD mixed type [facility]) Assessment & Plan: Continue following with pulmonology.  Continue pulmonary rehab, Ellipta inhaler, Ventolin rescue inhaler.   Protein-calorie malnutrition, severe (HCC) Assessment & Plan: Encouraged to  increase overall nutritional intake especially prioritizing protein.  Patient verbalized understanding and is agreeable to this plan.  She has already been making efforts to "eat better".   MDD (major depressive disorder), recurrent episode, moderate (HCC) Assessment & Plan: Stable, continue Celexa 40 mg daily.  Will continue to monitor.   GAD (generalized anxiety disorder) Assessment & Plan: Stable, continue Celexa 40 mg daily.     Return in about 6 months (around 09/02/2023) for follow-up for HLD, mood, COPD, fasting blood work 1 week before.     Melida Quitter, PA

## 2023-03-03 LAB — ANEMIA PROFILE B
Basophils Absolute: 0.1 10*3/uL (ref 0.0–0.2)
Basos: 1 %
EOS (ABSOLUTE): 0.2 10*3/uL (ref 0.0–0.4)
Eos: 3 %
Ferritin: 162 ng/mL — ABNORMAL HIGH (ref 15–150)
Folate: >20 ng/mL (ref >3.0)
Hematocrit: 33.8 % — ABNORMAL LOW (ref 34.0–46.6)
Hemoglobin: 10.8 g/dL — ABNORMAL LOW (ref 11.1–15.9)
Immature Grans (Abs): 0 10*3/uL (ref 0.0–0.1)
Immature Granulocytes: 0 %
Iron Saturation: 17 % (ref 15–55)
Iron: 72 ug/dL (ref 27–139)
Lymphocytes Absolute: 1.5 10*3/uL (ref 0.7–3.1)
Lymphs: 26 %
MCH: 29.5 pg (ref 26.6–33.0)
MCHC: 32 g/dL (ref 31.5–35.7)
MCV: 92 fL (ref 79–97)
Monocytes Absolute: 0.7 10*3/uL (ref 0.1–0.9)
Monocytes: 12 %
Neutrophils Absolute: 3.4 10*3/uL (ref 1.4–7.0)
Neutrophils: 58 %
Platelets: 298 10*3/uL (ref 150–450)
RBC: 3.66 x10E6/uL — ABNORMAL LOW (ref 3.77–5.28)
RDW: 13.4 % (ref 11.7–15.4)
Retic Ct Pct: 0.9 % (ref 0.6–2.6)
Total Iron Binding Capacity: 414 ug/dL (ref 250–450)
UIBC: 342 ug/dL (ref 118–369)
Vitamin B-12: 290 pg/mL (ref 232–1245)
WBC: 5.9 10*3/uL (ref 3.4–10.8)

## 2023-03-11 ENCOUNTER — Encounter: Payer: Self-pay | Admitting: Physician Assistant

## 2023-03-16 ENCOUNTER — Ambulatory Visit: Payer: Medicare Other | Attending: Rheumatology | Admitting: Rheumatology

## 2023-03-16 ENCOUNTER — Encounter: Payer: Self-pay | Admitting: Rheumatology

## 2023-03-16 VITALS — BP 98/62 | HR 74 | Resp 14 | Ht 60.0 in | Wt 88.0 lb

## 2023-03-16 DIAGNOSIS — M5030 Other cervical disc degeneration, unspecified cervical region: Secondary | ICD-10-CM

## 2023-03-16 DIAGNOSIS — M19042 Primary osteoarthritis, left hand: Secondary | ICD-10-CM

## 2023-03-16 DIAGNOSIS — M810 Age-related osteoporosis without current pathological fracture: Secondary | ICD-10-CM

## 2023-03-16 DIAGNOSIS — M62838 Other muscle spasm: Secondary | ICD-10-CM

## 2023-03-16 DIAGNOSIS — M25562 Pain in left knee: Secondary | ICD-10-CM

## 2023-03-16 DIAGNOSIS — M7712 Lateral epicondylitis, left elbow: Secondary | ICD-10-CM

## 2023-03-16 DIAGNOSIS — M19041 Primary osteoarthritis, right hand: Secondary | ICD-10-CM | POA: Diagnosis not present

## 2023-03-16 DIAGNOSIS — G8929 Other chronic pain: Secondary | ICD-10-CM

## 2023-03-16 DIAGNOSIS — Z8639 Personal history of other endocrine, nutritional and metabolic disease: Secondary | ICD-10-CM

## 2023-03-16 DIAGNOSIS — M797 Fibromyalgia: Secondary | ICD-10-CM | POA: Diagnosis not present

## 2023-03-16 DIAGNOSIS — M7062 Trochanteric bursitis, left hip: Secondary | ICD-10-CM

## 2023-03-16 DIAGNOSIS — M7711 Lateral epicondylitis, right elbow: Secondary | ICD-10-CM

## 2023-03-16 DIAGNOSIS — Z8669 Personal history of other diseases of the nervous system and sense organs: Secondary | ICD-10-CM

## 2023-03-16 DIAGNOSIS — Z8709 Personal history of other diseases of the respiratory system: Secondary | ICD-10-CM

## 2023-03-16 DIAGNOSIS — Z8739 Personal history of other diseases of the musculoskeletal system and connective tissue: Secondary | ICD-10-CM | POA: Diagnosis not present

## 2023-03-16 DIAGNOSIS — M5136 Other intervertebral disc degeneration, lumbar region: Secondary | ICD-10-CM

## 2023-03-16 DIAGNOSIS — Z8659 Personal history of other mental and behavioral disorders: Secondary | ICD-10-CM

## 2023-03-16 DIAGNOSIS — Z8719 Personal history of other diseases of the digestive system: Secondary | ICD-10-CM

## 2023-03-16 DIAGNOSIS — Z853 Personal history of malignant neoplasm of breast: Secondary | ICD-10-CM

## 2023-03-16 DIAGNOSIS — M7061 Trochanteric bursitis, right hip: Secondary | ICD-10-CM

## 2023-03-16 DIAGNOSIS — G4709 Other insomnia: Secondary | ICD-10-CM

## 2023-03-16 MED ORDER — TRIAMCINOLONE ACETONIDE 40 MG/ML IJ SUSP
10.0000 mg | INTRAMUSCULAR | Status: AC | PRN
Start: 2023-03-16 — End: 2023-03-16
  Administered 2023-03-16: 10 mg via INTRAMUSCULAR

## 2023-03-16 MED ORDER — LIDOCAINE HCL 1 % IJ SOLN
0.5000 mL | INTRAMUSCULAR | Status: AC | PRN
Start: 2023-03-16 — End: 2023-03-16
  Administered 2023-03-16: .5 mL

## 2023-03-16 NOTE — Patient Instructions (Signed)
Cervical Strain and Sprain Rehab Ask your health care provider which exercises are safe for you. Do exercises exactly as told by your health care provider and adjust them as directed. It is normal to feel mild stretching, pulling, tightness, or discomfort as you do these exercises. Stop right away if you feel sudden pain or your pain gets worse. Do not begin these exercises until told by your health care provider. Stretching and range-of-motion exercises Cervical side bending  Using good posture, sit on a stable chair or stand up. Without moving your shoulders, slowly tilt your left / right ear to your shoulder until you feel a stretch in the neck muscles on the opposite side. You should be looking straight ahead. Hold for __________ seconds. Repeat with the other side of your neck. Repeat __________ times. Complete this exercise __________ times a day. Cervical rotation  Using good posture, sit on a stable chair or stand up. Slowly turn your head to the side as if you are looking over your left / right shoulder. Keep your eyes level with the ground. Stop when you feel a stretch along the side and the back of your neck. Hold for __________ seconds. Repeat this by turning to your other side. Repeat __________ times. Complete this exercise __________ times a day. Thoracic extension and pectoral stretch  Roll a towel or a small blanket so it is about 4 inches (10 cm) in diameter. Lie down on your back on a firm surface. Put the towel in the middle of your back across your spine. It should not be under your shoulder blades. Put your hands behind your head and let your elbows fall out to your sides. Hold for __________ seconds. Repeat __________ times. Complete this exercise __________ times a day. Strengthening exercises Upper cervical flexion  Lie on your back with a thin pillow behind your head or a small, rolled-up towel under your neck. Gently tuck your chin toward your chest and nod  your head down to look toward your feet. Do not lift your head off the pillow. Hold for __________ seconds. Release the tension slowly. Relax your neck muscles completely before you repeat this exercise. Repeat __________ times. Complete this exercise __________ times a day. Cervical extension  Stand about 6 inches (15 cm) away from a wall, with your back facing the wall. Place a soft object, about 6-8 inches (15-20 cm) in diameter, between the back of your head and the wall. A soft object could be a small pillow, a ball, or a folded towel. Gently tilt your head back and press into the soft object. Keep your jaw and forehead relaxed. Hold for __________ seconds. Release the tension slowly. Relax your neck muscles completely before you repeat this exercise. Repeat __________ times. Complete this exercise __________ times a day. Posture and body mechanics Body mechanics refer to the movements and positions of your body while you do your daily activities. Posture is part of body mechanics. Good posture and healthy body mechanics can help to relieve stress in your body's tissues and joints. Good posture means that your spine is in its natural S-curve position (your spine is neutral), your shoulders are pulled back slightly, and your head is not tipped forward. The following are general guidelines for using improved posture and body mechanics in your everyday activities. Sitting  When sitting, keep your spine neutral and keep your feet flat on the floor. Use a footrest, if needed, and keep your thighs parallel to the floor. Avoid rounding   your shoulders. Avoid tilting your head forward. When working at a desk or a computer, keep your desk at a height where your hands are slightly lower than your elbows. Slide your chair under your desk so you are close enough to maintain good posture. When working at a computer, place your monitor at a height where you are looking straight ahead and you do not have to  tilt your head forward or downward to look at the screen. Standing  When standing, keep your spine neutral and keep your feet about hip-width apart. Keep a slight bend in your knees. Your ears, shoulders, and hips should line up. When you do a task in which you stand in one place for a long time, place one foot up on a stable object that is 2-4 inches (5-10 cm) high, such as a footstool. This helps keep your spine neutral. Resting When lying down and resting, avoid positions that are most painful for you. Try to support your neck in a neutral position. You can use a contour pillow or a small rolled-up towel. Your pillow should support your neck but not push on it. This information is not intended to replace advice given to you by your health care provider. Make sure you discuss any questions you have with your health care provider. Document Revised: 03/09/2022 Document Reviewed: 03/09/2022 Elsevier Patient Education  2024 Elsevier Inc.  

## 2023-03-17 DIAGNOSIS — I4719 Other supraventricular tachycardia: Secondary | ICD-10-CM | POA: Diagnosis not present

## 2023-03-17 DIAGNOSIS — I471 Supraventricular tachycardia, unspecified: Secondary | ICD-10-CM | POA: Diagnosis not present

## 2023-03-23 ENCOUNTER — Other Ambulatory Visit: Payer: Self-pay | Admitting: Family Medicine

## 2023-03-23 DIAGNOSIS — E559 Vitamin D deficiency, unspecified: Secondary | ICD-10-CM

## 2023-03-26 DIAGNOSIS — I4891 Unspecified atrial fibrillation: Secondary | ICD-10-CM | POA: Diagnosis not present

## 2023-03-26 DIAGNOSIS — Z8679 Personal history of other diseases of the circulatory system: Secondary | ICD-10-CM | POA: Diagnosis not present

## 2023-03-26 DIAGNOSIS — E876 Hypokalemia: Secondary | ICD-10-CM | POA: Diagnosis not present

## 2023-03-26 DIAGNOSIS — J440 Chronic obstructive pulmonary disease with acute lower respiratory infection: Secondary | ICD-10-CM | POA: Diagnosis not present

## 2023-03-29 ENCOUNTER — Telehealth: Payer: Self-pay | Admitting: Nurse Practitioner

## 2023-03-29 NOTE — Telephone Encounter (Signed)
Pt calling for heart monitor results

## 2023-03-29 NOTE — Telephone Encounter (Signed)
Patient aware of monitor results 

## 2023-04-19 ENCOUNTER — Other Ambulatory Visit: Payer: Self-pay | Admitting: Physician Assistant

## 2023-04-19 ENCOUNTER — Other Ambulatory Visit: Payer: Self-pay | Admitting: Family Medicine

## 2023-04-19 DIAGNOSIS — E559 Vitamin D deficiency, unspecified: Secondary | ICD-10-CM

## 2023-04-19 NOTE — Telephone Encounter (Signed)
Last Fill: 11/24/2022  Next Visit: 07/20/2023  Last Visit: 03/16/2023  Dx:  Trapezius muscle spasm   Current Dose per office note on 03/16/2023: not discussed  Okay to refill Methocarbamol?

## 2023-04-22 ENCOUNTER — Ambulatory Visit: Payer: Medicare Other | Admitting: Internal Medicine

## 2023-04-26 DIAGNOSIS — Z8679 Personal history of other diseases of the circulatory system: Secondary | ICD-10-CM | POA: Diagnosis not present

## 2023-04-26 DIAGNOSIS — I4891 Unspecified atrial fibrillation: Secondary | ICD-10-CM | POA: Diagnosis not present

## 2023-04-26 DIAGNOSIS — J440 Chronic obstructive pulmonary disease with acute lower respiratory infection: Secondary | ICD-10-CM | POA: Diagnosis not present

## 2023-04-26 DIAGNOSIS — E876 Hypokalemia: Secondary | ICD-10-CM | POA: Diagnosis not present

## 2023-05-09 ENCOUNTER — Other Ambulatory Visit: Payer: Self-pay | Admitting: Family Medicine

## 2023-05-09 DIAGNOSIS — E559 Vitamin D deficiency, unspecified: Secondary | ICD-10-CM

## 2023-05-11 ENCOUNTER — Ambulatory Visit (INDEPENDENT_AMBULATORY_CARE_PROVIDER_SITE_OTHER): Payer: Medicare Other

## 2023-05-11 ENCOUNTER — Ambulatory Visit (INDEPENDENT_AMBULATORY_CARE_PROVIDER_SITE_OTHER): Payer: Medicare Other | Admitting: Family Medicine

## 2023-05-11 VITALS — Ht 60.0 in | Wt 88.0 lb

## 2023-05-11 DIAGNOSIS — Z23 Encounter for immunization: Secondary | ICD-10-CM | POA: Diagnosis not present

## 2023-05-11 DIAGNOSIS — Z0000 Encounter for general adult medical examination without abnormal findings: Secondary | ICD-10-CM

## 2023-05-11 NOTE — Progress Notes (Signed)
After obtaining consent, and per orders of  Edstrom,PA injection of Fluad given by Tonny Bollman. Patient instructed to remain in clinic for 20 minutes afterwards, and to report any adverse reaction to me immediately.

## 2023-05-11 NOTE — Progress Notes (Signed)
Subjective:   [name] is a 72 y.o. female who presents for Medicare Annual (Subsequent) preventive examination.  Visit Complete: Virtual  I connected with  [name] on 05/11/23 by a audio enabled telemedicine application and verified that I am speaking with the correct person using two identifiers.  Patient Location: Home  Provider Location: Home Office  I discussed the limitations of evaluation and management by telemedicine. The patient expressed understanding and agreed to proceed.   Review of Systems    Vital Signs: Unable to obtain new vitals due to this being a telehealth visit.  Cardiac Risk Factors include: advanced age (>7men, >66 women);Other (see comment), Risk factor comments: Dx: DDD     Objective:    Today's Vitals   05/11/23 1111  Weight: 88 lb (39.9 kg)  Height: 5' (1.524 m)   Body mass index is 17.19 kg/m.     05/11/2023   11:25 AM 12/22/2022    2:06 PM 09/22/2017    5:05 PM 09/20/2017    1:40 PM 08/03/2017    4:02 PM 07/10/2014   11:21 AM  Advanced Directives  Does Patient Have a Medical Advance Directive? No No  No No No  Would patient like information on creating a medical advance directive? No - Patient declined No - Patient declined  No - Patient declined No - Patient declined      Information is confidential and restricted. Go to Review Flowsheets to unlock data.    Current Medications (verified) Outpatient Encounter Medications as of 05/11/2023  Medication Sig   albuterol (VENTOLIN HFA) 108 (90 Base) MCG/ACT inhaler Inhale 2 puffs every 6 hours as needed   Calcium Carbonate (CALTRATE 600 PO) Take by mouth.   cetirizine (ZYRTEC) 10 MG tablet Take 10 mg by mouth daily.   citalopram (CELEXA) 40 MG tablet 1po qd Filled by Arlana Hove-  Nurse Practitioner, [facility] (Patient taking differently: Take 40 mg by mouth daily.)   diltiazem (CARDIZEM CD) 120 MG 24 hr capsule Take 1 capsule (120 mg total) by mouth daily.    hydrOXYzine (ATARAX/VISTARIL) 10 MG tablet Take 10 mg by mouth every 6 (six) hours as needed for anxiety.   Magnesium 400 MG CAPS Take by mouth.   methocarbamol (ROBAXIN) 750 MG tablet TAKE 1 TABLET BY MOUTH 2 TIMES DAILY AS NEEDED FOR MUSCLE SPASMS (NON FORMULARY)   Multiple Vitamins-Minerals (MULTIVITAMIN WITH MINERALS) tablet Take 1 tablet by mouth daily.   umeclidinium bromide (INCRUSE ELLIPTA) 62.5 MCG/ACT AEPB Inhale 1 puff into the lungs daily.   Vitamin D, Ergocalciferol, (DRISDOL) 1.25 MG (50000 UNIT) CAPS capsule Take 1 capsule (50,000 Units total) by mouth every 7 (seven) days.   No facility-administered encounter medications on file as of 05/11/2023.    Allergies (verified) Amoxicillin-pot clavulanate, Doxycycline, and Venlafaxine   History: Past Medical History:  Diagnosis Date   Anemia    B12 deficient   B12 deficiency    Breast cancer (HCC)    left breast.   Chronic SI joint pain    Bilateral, Dr. Corliss Skains   Fibromyalgia    Dr. Corliss Skains   GERD (gastroesophageal reflux disease)    Greater trochanteric bursitis of both hips    Dr. Corliss Skains   Hyperlipidemia    IBS (irritable bowel syndrome)    LV dysfunction    iatrogenic from chemotherapy ; on Carvedilol    Migraines    Osteoporosis    Dr Lomax---> transferring to a new gyn   Past Surgical  History:  Procedure Laterality Date   ABDOMINAL HYSTERECTOMY     BSO for  Endometriosis   CARPAL TUNNEL RELEASE     left   CERVICAL DISC SURGERY     COLONOSCOPY W/ POLYPECTOMY  1999   Dr Kinnie Scales   ESOPHAGEAL DILATION  2005   Dr Kinnie Scales   MASTECTOMY  2009   bilateral, Dr Leafy Ro     Family History  Problem Relation Age of Onset   Diabetes Mother    Breast cancer Mother    Depression Mother        anxiety   Heart disease Mother        in her 23s   Colon cancer Sister    Healthy Daughter    Stroke Neg Hx    Social History   Socioeconomic History   Marital status: Widowed    Spouse name: Not  on file   Number of children: 1   Years of education: Not on file   Highest education level: 12th grade  Occupational History   Occupation: retired, Warehouse manager   Tobacco Use   Smoking status: Former    Current packs/day: 0.00    Average packs/day: 1 pack/day for 30.0 years (30.0 ttl pk-yrs)    Types: Cigarettes    Start date: 08/31/1960    Quit date: 08/31/1990    Years since quitting: 32.7    Passive exposure: Never   Smokeless tobacco: Never  Vaping Use   Vaping status: Never Used  Substance and Sexual Activity   Alcohol use: No   Drug use: Not Currently    Types: Marijuana    Comment: 08/01/2017 last used    Sexual activity: Not Currently  Other Topics Concern   Not on file  Social History Narrative   Lives w/ husband   Social Determinants of Health   Financial Resource Strain: Low Risk  (05/11/2023)   Overall Financial Resource Strain (CARDIA)    Difficulty of Paying Living Expenses: Not hard at all  Food Insecurity: No Food Insecurity (05/11/2023)   Hunger Vital Sign    Worried About Running Out of Food in the Last Year: Never true    Ran Out of Food in the Last Year: Never true  Transportation Needs: No Transportation Needs (05/11/2023)   PRAPARE - Administrator, Civil Service (Medical): No    Lack of Transportation (Non-Medical): No  Physical Activity: Inactive (05/11/2023)   Exercise Vital Sign    Days of Exercise per Week: 0 days    Minutes of Exercise per Session: 0 min  Stress: No Stress Concern Present (05/11/2023)   [employer] of Occupational Health - Occupational Stress Questionnaire    Feeling of Stress : Not at all  Social Connections: Socially Isolated (05/11/2023)   Social Connection and Isolation Panel [NHANES]    Frequency of Communication with Friends and Family: More than three times a week    Frequency of Social Gatherings with Friends and Family: More than three times a week    Attends Religious Services: Never    Doctor, general practice or Organizations: No    Attends Banker Meetings: Never    Marital Status: Widowed    Tobacco Counseling Counseling given: Not Answered   Clinical Intake:  Pre-visit preparation completed: Yes  Pain : No/denies pain     BMI - recorded: 17.19 Nutritional Status: BMI <19  Underweight Nutritional Risks: None Diabetes: No  How often do you need to  have someone help you when you read instructions, pamphlets, or other written materials from your doctor or pharmacy?: 1 - Never  Interpreter Needed?: No  Information entered by :: Theresa Mulligan LPN   Activities of Daily Living    05/11/2023   11:23 AM 12/22/2022   11:00 PM  In your present state of health, do you have any difficulty performing the following activities:  Hearing? 1 0  Comment Wears hearing aids   Vision? 0 0  Difficulty concentrating or making decisions? 0 0  Walking or climbing stairs? 0 0  Dressing or bathing? 0 0  Doing errands, shopping? 0 0  Preparing Food and eating ? N   Using the Toilet? N   In the past six months, have you accidently leaked urine? N   Do you have problems with loss of bowel control? N   Managing your Medications? N   Managing your Finances? N   Housekeeping or managing your Housekeeping? N     Patient Care Team: Melida Quitter, PA as PCP - General (Family Medicine) Rollene Rotunda, MD as PCP - Cardiology (Cardiology) Pollyann Savoy, MD as Consulting Physician (Rheumatology) Jean Rosenthal, NP (Inactive) as Nurse Practitioner (Gynecology) Sharrell Ku, MD as Consulting Physician (Gastroenterology) Cherlyn Roberts, MD as Consulting Physician (Dermatology) Waymon Budge, MD as Consulting Physician (Pulmonary Disease) Vesta Mixer (Psychiatry) Waymon Budge, MD as Consulting Physician (Pulmonary Disease)  Indicate any recent Medical Services you may have received from other than [facility] providers in the past year (date may be approximate).      Assessment:   This is a routine wellness examination for Liyah.  Hearing/Vision screen Hearing Screening - Comments:: Wears hearing aids Vision Screening - Comments:: Wears rx glasses - up to date with routine eye exams with  Deferred   Goals Addressed               This Visit's Progress     Increase physical activity (pt-stated)         Depression Screen    05/11/2023   11:18 AM 03/02/2023   10:19 AM 12/31/2022    2:29 PM 09/21/2019    9:48 AM 09/12/2019    1:13 PM 06/28/2019   10:16 AM 04/17/2019    4:18 PM  PHQ 2/9 Scores  PHQ - 2 Score 0 0 4 2 0 0 2  PHQ- 9 Score  3 12 12 3 2 8     Fall Risk    05/11/2023   11:24 AM 06/28/2019   10:15 AM 03/17/2018   11:55 AM 08/03/2017    4:02 PM 03/22/2017    3:52 PM  Fall Risk   Falls in the past year? 0 0 No No No  Number falls in past yr: 0 0     Injury with Fall? 0 0     Risk for fall due to : No Fall Risks      Follow up Falls prevention discussed Falls evaluation completed       MEDICARE RISK AT HOME: Medicare Risk at Home Any stairs in or around the home?: No If so, are there any without handrails?: No Home free of loose throw rugs in walkways, pet beds, electrical cords, etc?: Yes Adequate lighting in your home to reduce risk of falls?: Yes Life alert?: No Use of a cane, walker or w/c?: No Grab bars in the bathroom?: No Shower chair or bench in shower?: No Elevated toilet seat or a handicapped toilet?: No  TIMED UP  AND GO:  Was the test performed?  No    Cognitive Function:        05/11/2023   11:25 AM 06/28/2019   10:13 AM  6CIT Screen  What Year? 0 points 0 points  What month? 0 points 0 points  What time? 0 points 0 points  Count back from 20 0 points 0 points  Months in reverse 0 points 0 points  Repeat phrase 0 points 0 points  Total Score 0 points 0 points    Immunizations Immunization History  Administered Date(s) Administered   Fluad Trivalent(High Dose 65+) 05/11/2023   Influenza Whole  07/14/2007, 05/31/2012   Influenza, High Dose Seasonal PF 07/18/2018   PFIZER(Purple Top)SARS-COV-2 Vaccination 10/30/2019, 11/26/2019   Td 04/01/1998   Tdap 10/29/2011    TDAP status: Due, Education has been provided regarding the importance of this vaccine. Advised may receive this vaccine at local pharmacy or Health Dept. Aware to provide a copy of the vaccination record if obtained from local pharmacy or Health Dept. Verbalized acceptance and understanding.  Flu Vaccine status: Up to date  Pneumococcal vaccine status: Due, Education has been provided regarding the importance of this vaccine. Advised may receive this vaccine at local pharmacy or Health Dept. Aware to provide a copy of the vaccination record if obtained from local pharmacy or Health Dept. Verbalized acceptance and understanding.  Covid-19 vaccine status: Declined, Education has been provided regarding the importance of this vaccine but patient still declined. Advised may receive this vaccine at local pharmacy or Health Dept.or vaccine clinic. Aware to provide a copy of the vaccination record if obtained from local pharmacy or Health Dept. Verbalized acceptance and understanding.  Qualifies for Shingles Vaccine? Yes   Zostavax completed No   Shingrix Completed?: No.    Education has been provided regarding the importance of this vaccine. Patient has been advised to call insurance company to determine out of pocket expense if they have not yet received this vaccine. Advised may also receive vaccine at local pharmacy or Health Dept. Verbalized acceptance and understanding.  Screening Tests Health Maintenance  Topic Date Due   Zoster Vaccines- Shingrix (1 of 2) Never done   Pneumonia Vaccine 52+ Years old (1 of 1 - PCV) Never done   COVID-19 Vaccine (3 - Pfizer risk series) 12/24/2019   DTaP/Tdap/Td (3 - Td or Tdap) 10/28/2021   Colonoscopy  02/21/2023   Medicare Annual Wellness (AWV)  05/10/2024   INFLUENZA VACCINE   Completed   DEXA SCAN  Completed   Hepatitis C Screening  Completed   HPV VACCINES  Aged Out    Health Maintenance  Health Maintenance Due  Topic Date Due   Zoster Vaccines- Shingrix (1 of 2) Never done   Pneumonia Vaccine 76+ Years old (1 of 1 - PCV) Never done   COVID-19 Vaccine (3 - Pfizer risk series) 12/24/2019   DTaP/Tdap/Td (3 - Td or Tdap) 10/28/2021   Colonoscopy  02/21/2023    Colorectal cancer screening: Referral to GI placed Patient declined. Pt aware the office will call re: appt.    Bone Density status: Completed 01/01/20. Results reflect: Bone density results: OSTEOPOROSIS. Repeat every   years.  Lung Cancer Screening: (Low Dose CT Chest recommended if Age 56-80 years, 20 pack-year currently smoking OR have quit w/in 15years.) does not qualify.     Additional Screening:  Hepatitis C Screening: does qualify; Completed 03/02/23  Vision Screening: Recommended annual ophthalmology exams for early detection of glaucoma and other disorders  of the eye. Is the patient up to date with their annual eye exam?  Yes  Who is the provider or what is the name of the office in which the patient attends annual eye exams? Deferred If pt is not established with a provider, would they like to be referred to a provider to establish care? No .   Dental Screening: Recommended annual dental exams for proper oral hygiene   Community Resource Referral / Chronic Care Management:  CRR required this visit?  No   CCM required this visit?  No     Plan:     I have personally reviewed and noted the following in the patient's chart:   Medical and social history Use of alcohol, tobacco or illicit drugs  Current medications and supplements including opioid prescriptions. Patient is not currently taking opioid prescriptions. Functional ability and status Nutritional status Physical activity Advanced directives List of other physicians Hospitalizations, surgeries, and ER visits in  previous 12 months Vitals Screenings to include cognitive, depression, and falls Referrals and appointments  In addition, I have reviewed and discussed with patient certain preventive protocols, quality metrics, and best practice recommendations. A written personalized care plan for preventive services as well as general preventive health recommendations were provided to patient.     Tillie Rung, LPN   12/07/8117   After Visit Summary: (MyChart) Due to this being a telephonic visit, the after visit summary with patients personalized plan was offered to patient via MyChart   Nurse Notes: None

## 2023-05-11 NOTE — Patient Instructions (Addendum)
[name] , Thank you for taking time to come for your Medicare Wellness Visit. I appreciate your ongoing commitment to your health goals. Please review the following plan we discussed and let me know if I can assist you in the future.   Referrals/Orders/Follow-Ups/Clinician Recommendations:   This is a list of the screening recommended for you and due dates:  Health Maintenance  Topic Date Due   Zoster (Shingles) Vaccine (1 of 2) Never done   Pneumonia Vaccine (1 of 1 - PCV) Never done   COVID-19 Vaccine (3 - Pfizer risk series) 12/24/2019   DTaP/Tdap/Td vaccine (3 - Td or Tdap) 10/28/2021   Colon Cancer Screening  02/21/2023   Medicare Annual Wellness Visit  05/10/2024   Flu Shot  Completed   DEXA scan (bone density measurement)  Completed   Hepatitis C Screening  Completed   HPV Vaccine  Aged Out    Advanced directives: (Declined) Advance directive discussed with you today. Even though you declined this today, please call our office should you change your mind, and we can give you the proper paperwork for you to fill out.  Next Medicare Annual Wellness Visit scheduled for next year: Yes

## 2023-05-29 ENCOUNTER — Other Ambulatory Visit: Payer: Self-pay | Admitting: Family Medicine

## 2023-05-29 DIAGNOSIS — E559 Vitamin D deficiency, unspecified: Secondary | ICD-10-CM

## 2023-06-05 DIAGNOSIS — I4719 Other supraventricular tachycardia: Secondary | ICD-10-CM | POA: Insufficient documentation

## 2023-06-05 NOTE — Progress Notes (Unsigned)
Cardiology Office Note:   Date:  06/07/2023  ID:  [name], DOB 03-May-1951, MRN [id number] PCP: Melida Quitter, PA  [facility] Providers Cardiologist:  Rollene Rotunda, MD {   History of Present Illness:   [name] is a 72 y.o. female who was previously seen by Dr. Gala Romney in the setting of nonischemic cardiomyopathy following treatment for breast cancer, EF as low as 34%, improved to 60 to 65%.  She presented to the ED on 12/22/2022 with complaints of productive cough in the setting of COPD exacerbation, bronchitis.  Two weeks prior to her hospitalization and her husband died of an inoperable brain tumor.  She was noted to be in possible atrial fibrillation versus multifocal atrial tachycardia with rapid rate.  Cardiology was consulted.  Repeat echocardiogram showed EF 60 to 65%, normal LV function, no RWMA, G1 DD, normal RV systolic function, mild mitral valve regurgitation.  Anticoagulation was deferred.  It was difficult to discern between atrial fibrillation and multifocal atrial tachycardia.  She was started on diltiazem.  Outpatient cardiac monitor revealed predominantly sinus rhythm, frequent runs of SVT, frequent isolated PACs, ventricular bigeminy.  It was noted that she was not taking diltiazem at time of order.  She was instructed to start taking diltiazem and repeat 7-day ZIO.  Additionally, she was referred to EP given frequent SVT.  ZIO demonstrated runs of SVT but no atrial fibrillation.  She is chronically on 4 L of oxygen at home.   She presents today for follow-up.   She follows with Dr. Maple Hudson for pulmonary.  She lives by herself.  She goes to the grocery store.  The patient denies any new symptoms such as chest discomfort, neck or arm discomfort. There has been no new shortness of breath, PND or orthopnea. There have been no reported palpitations, presyncope or syncope.   ROS: As stated in the HPI and negative for all other systems.  Studies  Reviewed:    EKG:   NA  Risk Assessment/Calculations:              Physical Exam:   VS:  BP 110/68 (BP Location: Left Arm, Patient Position: Sitting, Cuff Size: Normal)   Pulse 80   Ht 5' (1.524 m)   Wt 92 lb (41.7 kg)   SpO2 99%   BMI 17.97 kg/m    Wt Readings from Last 3 Encounters:  06/07/23 92 lb (41.7 kg)  05/11/23 88 lb (39.9 kg)  03/16/23 88 lb (39.9 kg)     GEN: Well nourished, well developed in no acute distress NECK: No JVD; No carotid bruits CARDIAC: RRR, no murmurs, rubs, gallops RESPIRATORY:    Decreased breath sounds ABDOMEN: Soft, non-tender, non-distended EXTREMITIES:  No edema; No deformity   ASSESSMENT AND PLAN:   Atrial tachycardia/SVT:     She has had no symptomatic dysrhythmias.  No change in therapy.  She will continue the meds as listed.  NICM: She had previously had a reduced ejection fraction but improved on the last evaluation to normal.  No change in therapy.  MR: She has some mild mitral regurgitation we can follow this clinically   COPD:   She has had some worsening pulmonary function is on 4 L having previously been on 3.  She is followed closely by Dr. Maple Hudson.       Follow up with me in one year.   Signed, Rollene Rotunda, MD

## 2023-06-07 ENCOUNTER — Encounter: Payer: Self-pay | Admitting: Cardiology

## 2023-06-07 ENCOUNTER — Ambulatory Visit: Payer: Medicare Other | Attending: Cardiology | Admitting: Cardiology

## 2023-06-07 VITALS — BP 110/68 | HR 80 | Ht 60.0 in | Wt 92.0 lb

## 2023-06-07 DIAGNOSIS — I4719 Other supraventricular tachycardia: Secondary | ICD-10-CM

## 2023-06-07 DIAGNOSIS — J449 Chronic obstructive pulmonary disease, unspecified: Secondary | ICD-10-CM | POA: Diagnosis not present

## 2023-06-07 DIAGNOSIS — I428 Other cardiomyopathies: Secondary | ICD-10-CM

## 2023-06-07 NOTE — Patient Instructions (Signed)

## 2023-06-19 NOTE — Progress Notes (Signed)
HPI- F former smoker (30 pk yrs) followed for COPD, complicated by Dilated Cardiomyopathy, COPD, IBS, GERD, Degenerative Disc Disease, Depression, Hx Breast Cancer/ Bilat mastectomy, no XRT, Chemo stopped due to cardiomyopathy, Fibromyalgia, PFT 03/31/17- Severe obstruction, Response to BD, DLCO mod reduced. FVC 2.20/ 83%, FEV1 1.06/ 52%, R 0.48, TLC 125%, DLCO 49%,   ==============================================================================   02/19/23- 71 yoF former smoker (30 pk yrs) followed for COPD, complicated by Dilated Cardiomyopathy, AFib,  COPD, IBS, GERD, Degenerative Disc Disease, Depression, Hx Breast Cancer/ Bilat mastectomy, no XRT, Chemo stopped due to cardiomyopa --Proair hfa, Breztri> Incrusethy, Fibromyalgia,  O2 3L  started in hosp for exac COPD in April -----Breathing has been ok SOB  On oxygen concentrator at night.  Using E tanks for portable oxygen.  We discussed lighter portable systems. She needs refills for Ventolin and Incruse inhalers which do seem to help. CXR 12/01/22-  IMPRESSION: Hyperinflation.  No acute cardiopulmonary disease  06/21/23- 71 yoF former smoker (30 pk yrs) followed for COPD, complicated by Dilated Cardiomyopathy, AFib,   IBS, GERD, Degenerative Disc Disease, Depression, Hx Breast Cancer/ Bilat mastectomy, no XRT, Chemo stopped due to cardiomyopathy, Fibromyalgia,  --Proair hfa, Breztri> Incruse,  O2 3L  started in hosp for exac COPD in April, 2024/ [facility] Still easy DOE, but no exacerbation since last hospital stay in April. Mild dry cough. Had flu and RSV vax.  Discussed pneumonia vax. Watching adequacy of current inhalers.  ROS-see HPI   + = positive Constitutional:    weight loss, night sweats, fevers, chills, fatigue, lassitude. HEENT:    headaches, difficulty swallowing, tooth/dental problems, sore throat,       sneezing, itching, ear ache, nasal congestion, post nasal drip, snoring CV:    chest pain, orthopnea, PND, swelling in lower  extremities, anasarca,                                   dizziness, palpitations Resp:  + shortness of breath with exertion or at rest.                productive cough,   non-productive cough, coughing up of blood.              change in color of mucus.  wheezing.   Skin:    rash or lesions. GI:  No-   heartburn, indigestion, abdominal pain, nausea, vomiting, diarrhea,                 change in bowel habits, loss of appetite GU: dysuria, change in color of urine, no urgency or frequency.   flank pain. MS:   joint pain, stiffness, decreased range of motion, back pain. Neuro-     nothing unusual Psych:  change in mood or affect.  depression or anxiety.   memory loss.  OBJ- Physical Exam  O2 2L tank General- Alert, Oriented, Affect-appropriate, Distress- none acute, +petite Skin- rash-none, lesions- none, excoriation- none Lymphadenopathy- none Head- atraumatic            Eyes- Gross vision intact, PERRLA, conjunctivae and secretions clear            Ears- Hearing, canals-normal            Nose- Clear, no-Septal dev, mucus, polyps, erosion, perforation             Throat- Mallampati II-III , mucosa clear , drainage- none, tonsils- atrophic Neck- flexible , trachea midline,  no stridor , thyroid nl, carotid no bruit Chest - symmetrical excursion , unlabored           Heart/CV- RRR , no murmur , no gallop  , no rub, nl s1 s2                           - JVD- none , edema- none, stasis changes- none, varices- none           Lung- +coarse in bases/ distant, wheeze- none, cough- none , dullness-none, rub- none           Chest wall- +bilateral reconstruction after breast cancer Abd-  Br/ Gen/ Rectal- Not done, not indicated Extrem- cyanosis- none, clubbing, none, atrophy- none, strength- nl Neuro- grossly intact to observation

## 2023-06-21 ENCOUNTER — Encounter: Payer: Self-pay | Admitting: Internal Medicine

## 2023-06-21 ENCOUNTER — Ambulatory Visit: Payer: Medicare Other | Admitting: Internal Medicine

## 2023-06-21 VITALS — BP 104/58 | HR 73 | Ht 60.0 in | Wt 92.6 lb

## 2023-06-21 DIAGNOSIS — J449 Chronic obstructive pulmonary disease, unspecified: Secondary | ICD-10-CM | POA: Diagnosis not present

## 2023-06-21 DIAGNOSIS — J9611 Chronic respiratory failure with hypoxia: Secondary | ICD-10-CM | POA: Diagnosis not present

## 2023-06-21 NOTE — Patient Instructions (Signed)
We can continue O2 3L  We can continue your inhalers- call for refills when needed  Order- Prevnar 20 pneumonia vaccine

## 2023-07-06 NOTE — Progress Notes (Deleted)
Office Visit Note  Patient: [name]             Date of Birth: July 12, 1951           MRN: [id number]             PCP: Melida Quitter, PA Referring: Melida Quitter, PA Visit Date: 07/20/2023 Occupation: @GUAROCC @  Subjective:    History of Present Illness: [name] is a 72 y.o. female with history of fibromyalgia and osteoarthritis.   Trapezius trigger point injections performed on 03/16/23   Activities of Daily Living:  Patient reports morning stiffness for *** {minute/hour:19697}.   Patient {ACTIONS;DENIES/REPORTS:21021675::"Denies"} nocturnal pain.  Difficulty dressing/grooming: {ACTIONS;DENIES/REPORTS:21021675::"Denies"} Difficulty climbing stairs: {ACTIONS;DENIES/REPORTS:21021675::"Denies"} Difficulty getting out of chair: {ACTIONS;DENIES/REPORTS:21021675::"Denies"} Difficulty using hands for taps, buttons, cutlery, and/or writing: {ACTIONS;DENIES/REPORTS:21021675::"Denies"}  No Rheumatology ROS completed.   PMFS History:  Patient Active Problem List   Diagnosis Date Noted   Atrial tachycardia (HCC) 06/05/2023   Chronic respiratory failure with hypoxia (HCC) 02/19/2023   Protein-calorie malnutrition, severe (HCC) 12/24/2022   Tachycardia 12/23/2022   Tachyarrhythmia 12/22/2022   Hypokalemia 12/22/2022   Nasal congestion 09/06/2020   Estrogen deficiency 06/28/2019   Osteopenia after menopause 11/06/2017   MDD (major depressive disorder), recurrent episode, moderate (HCC) 09/22/2017   Suicide attempt (HCC)    B12 deficiency 08/03/2017   Other insomnia 11/23/2016   DDD (degenerative disc disease), cervical 11/23/2016   DDD (degenerative disc disease), lumbar 11/23/2016   Other idiopathic scoliosis, lumbar region 11/23/2016   COPD mixed type (HCC) 11/20/2013   Vitamin D deficiency 07/06/2011   CARDIOMYOPATHY, PRIMARY, DILATED 01/01/2009   Malignant tumor of breast (HCC) 07/31/2008   h/o HYPERLIPIDEMIA 10/18/2007   Esophagitis 10/18/2007    GAD (generalized anxiety disorder) 10/12/2007   IBS 10/12/2007   Primary osteoarthritis of both hands 10/12/2007   Osteoporosis 10/12/2007   History of colonic polyps 10/12/2007   MIGRAINES, HX OF 10/12/2007   Fibromyalgia--Dr. Victory Dakin, on Ultram 07/14/2007    Past Medical History:  Diagnosis Date   Anemia    B12 deficient   B12 deficiency    Breast cancer (HCC)    left breast.   Chronic SI joint pain    Bilateral, Dr. Corliss Skains   Fibromyalgia    Dr. Corliss Skains   GERD (gastroesophageal reflux disease)    Greater trochanteric bursitis of both hips    Dr. Corliss Skains   Hyperlipidemia    IBS (irritable bowel syndrome)    LV dysfunction    iatrogenic from chemotherapy ; on Carvedilol    Migraines    Osteoporosis    Dr Lomax---> transferring to a new gyn    Family History  Problem Relation Age of Onset   Diabetes Mother    Breast cancer Mother    Depression Mother        anxiety   Heart disease Mother        in her 32s   Colon cancer Sister    Healthy Daughter    Stroke Neg Hx    Past Surgical History:  Procedure Laterality Date   ABDOMINAL HYSTERECTOMY     BSO for  Endometriosis   CARPAL TUNNEL RELEASE     left   CERVICAL DISC SURGERY     COLONOSCOPY W/ POLYPECTOMY  1999   Dr Kinnie Scales   ESOPHAGEAL DILATION  2005   Dr Kinnie Scales   MASTECTOMY  2009   bilateral, Dr Leafy Ro     Social History  Social History Narrative   Lives w/ husband   Immunization History  Administered Date(s) Administered   Fluad Trivalent(High Dose 65+) 05/11/2023   Influenza Whole 07/14/2007, 05/31/2012   Influenza, High Dose Seasonal PF 07/18/2018   PFIZER(Purple Top)SARS-COV-2 Vaccination 10/30/2019, 11/26/2019   PNEUMOCOCCAL CONJUGATE-20 06/21/2023   Td 04/01/1998   Tdap 10/29/2011     Objective: Vital Signs: There were no vitals taken for this visit.   Physical Exam Vitals and nursing note reviewed.  Constitutional:      Appearance: She is well-developed.   HENT:     Head: Normocephalic and atraumatic.  Eyes:     Conjunctiva/sclera: Conjunctivae normal.  Cardiovascular:     Rate and Rhythm: Normal rate and regular rhythm.     Heart sounds: Normal heart sounds.  Pulmonary:     Effort: Pulmonary effort is normal.     Breath sounds: Normal breath sounds.  Abdominal:     General: Bowel sounds are normal.     Palpations: Abdomen is soft.  Musculoskeletal:     Cervical back: Normal range of motion.  Lymphadenopathy:     Cervical: No cervical adenopathy.  Skin:    General: Skin is warm and dry.     Capillary Refill: Capillary refill takes less than 2 seconds.  Neurological:     Mental Status: She is alert and oriented to person, place, and time.  Psychiatric:        Behavior: Behavior normal.      Musculoskeletal Exam: ***  CDAI Exam: CDAI Score: -- Patient Global: --; Provider Global: -- Swollen: --; Tender: -- Joint Exam 07/20/2023   No joint exam has been documented for this visit   There is currently no information documented on the homunculus. Go to the Rheumatology activity and complete the homunculus joint exam.  Investigation: No additional findings.  Imaging: No results found.  Recent Labs: Lab Results  Component Value Date   WBC 5.9 03/02/2023   HGB 10.8 (L) 03/02/2023   PLT 298 03/02/2023   NA 140 02/15/2023   K 4.3 02/15/2023   CL 99 02/15/2023   CO2 30 (H) 02/15/2023   GLUCOSE 83 02/15/2023   BUN 15 02/15/2023   CREATININE 0.70 02/15/2023   BILITOT 0.3 02/23/2023   ALKPHOS 60 02/23/2023   AST 31 02/23/2023   ALT 19 02/23/2023   PROT 6.3 02/23/2023   ALBUMIN 4.3 02/23/2023   CALCIUM 9.3 02/15/2023   GFRAA 80 06/28/2019    Speciality Comments: Narcotic Agreement Broken- [place]jun[place] found on last UDS.   Procedures:  No procedures performed Allergies: Amoxicillin-pot clavulanate, Doxycycline, and Venlafaxine   Assessment / Plan:     Visit Diagnoses: Trapezius muscle spasm  DDD (degenerative  disc disease), cervical  Degeneration of intervertebral disc of lumbar region without discogenic back pain or lower extremity pain  History of scoliosis  Primary osteoarthritis of both hands  Lateral epicondylitis of both elbows  Fibromyalgia  Trochanteric bursitis of both hips  Chronic pain of left knee  Other insomnia  Age-related osteoporosis without current pathological fracture  History of vitamin D deficiency  History of gastroesophageal reflux (GERD)  History of hyperlipidemia  History of IBS  History of migraine  History of COPD  History of anxiety  History of depression  History of breast cancer  Orders: No orders of the defined types were placed in this encounter.  No orders of the defined types were placed in this encounter.   Face-to-face time spent with patient was *** minutes.  Greater than 50% of time was spent in counseling and coordination of care.  Follow-Up Instructions: No follow-ups on file.   Gearldine Bienenstock, PA-C  Note - This record has been created using Dragon software.  Chart creation errors have been sought, but may not always  have been located. Such creation errors do not reflect on  the standard of medical care.

## 2023-07-09 ENCOUNTER — Other Ambulatory Visit: Payer: Self-pay | Admitting: Physician Assistant

## 2023-07-09 NOTE — Telephone Encounter (Signed)
Last Fill: 04/19/2023   Next Visit: 07/20/2023   Last Visit: 03/16/2023   Dx:  Trapezius muscle spasm    Current Dose per office note on 03/16/2023: not discussed   Okay to refill Methocarbamol?

## 2023-07-17 ENCOUNTER — Encounter: Payer: Self-pay | Admitting: Internal Medicine

## 2023-07-17 NOTE — Assessment & Plan Note (Signed)
Continue albuterol HFA and Incruse Plan- consider nebulizer. Reconsider maintenance inhaler

## 2023-07-17 NOTE — Assessment & Plan Note (Signed)
Benefits from oxygen - sleep and exertion Plan- continue O2 3L

## 2023-07-20 ENCOUNTER — Ambulatory Visit: Payer: Medicare Other | Admitting: Physician Assistant

## 2023-07-20 DIAGNOSIS — M5030 Other cervical disc degeneration, unspecified cervical region: Secondary | ICD-10-CM

## 2023-07-20 DIAGNOSIS — Z8739 Personal history of other diseases of the musculoskeletal system and connective tissue: Secondary | ICD-10-CM

## 2023-07-20 DIAGNOSIS — M62838 Other muscle spasm: Secondary | ICD-10-CM

## 2023-07-20 DIAGNOSIS — Z8639 Personal history of other endocrine, nutritional and metabolic disease: Secondary | ICD-10-CM

## 2023-07-20 DIAGNOSIS — M7061 Trochanteric bursitis, right hip: Secondary | ICD-10-CM

## 2023-07-20 DIAGNOSIS — Z853 Personal history of malignant neoplasm of breast: Secondary | ICD-10-CM

## 2023-07-20 DIAGNOSIS — M51369 Other intervertebral disc degeneration, lumbar region without mention of lumbar back pain or lower extremity pain: Secondary | ICD-10-CM

## 2023-07-20 DIAGNOSIS — M19041 Primary osteoarthritis, right hand: Secondary | ICD-10-CM

## 2023-07-20 DIAGNOSIS — Z8659 Personal history of other mental and behavioral disorders: Secondary | ICD-10-CM

## 2023-07-20 DIAGNOSIS — M810 Age-related osteoporosis without current pathological fracture: Secondary | ICD-10-CM

## 2023-07-20 DIAGNOSIS — Z8709 Personal history of other diseases of the respiratory system: Secondary | ICD-10-CM

## 2023-07-20 DIAGNOSIS — G8929 Other chronic pain: Secondary | ICD-10-CM

## 2023-07-20 DIAGNOSIS — G4709 Other insomnia: Secondary | ICD-10-CM

## 2023-07-20 DIAGNOSIS — M797 Fibromyalgia: Secondary | ICD-10-CM

## 2023-07-20 DIAGNOSIS — Z8669 Personal history of other diseases of the nervous system and sense organs: Secondary | ICD-10-CM

## 2023-07-20 DIAGNOSIS — M7711 Lateral epicondylitis, right elbow: Secondary | ICD-10-CM

## 2023-07-20 DIAGNOSIS — Z8719 Personal history of other diseases of the digestive system: Secondary | ICD-10-CM

## 2023-07-20 NOTE — Progress Notes (Deleted)
Office Visit Note  Patient: [name]             Date of Birth: [birth year]           MRN: [id number]             PCP: Melida Quitter, PA Referring: Melida Quitter, PA Visit Date: 07/21/2023 Occupation: @GUAROCC @  Subjective:    History of Present Illness: [name] is a 72 y.o. female with history of fibromyalgia and osteoarthritis.   Trapezius trigger point injections performed on 03/16/23   Activities of Daily Living:  Patient reports morning stiffness for *** {minute/hour:19697}.   Patient {ACTIONS;DENIES/REPORTS:21021675::"Denies"} nocturnal pain.  Difficulty dressing/grooming: {ACTIONS;DENIES/REPORTS:21021675::"Denies"} Difficulty climbing stairs: {ACTIONS;DENIES/REPORTS:21021675::"Denies"} Difficulty getting out of chair: {ACTIONS;DENIES/REPORTS:21021675::"Denies"} Difficulty using hands for taps, buttons, cutlery, and/or writing: {ACTIONS;DENIES/REPORTS:21021675::"Denies"}  No Rheumatology ROS completed.   PMFS History:  Patient Active Problem List   Diagnosis Date Noted   Atrial tachycardia (HCC) 06/05/2023   Chronic respiratory failure with hypoxia (HCC) 02/19/2023   Protein-calorie malnutrition, severe (HCC) 12/24/2022   Tachycardia 12/23/2022   Tachyarrhythmia 12/22/2022   Hypokalemia 12/22/2022   Nasal congestion 09/06/2020   Estrogen deficiency 06/28/2019   Osteopenia after menopause 11/06/2017   MDD (major depressive disorder), recurrent episode, moderate (HCC) 09/22/2017   Suicide attempt (HCC)    B12 deficiency 08/03/2017   Other insomnia 11/23/2016   DDD (degenerative disc disease), cervical 11/23/2016   DDD (degenerative disc disease), lumbar 11/23/2016   Other idiopathic scoliosis, lumbar region 11/23/2016   COPD mixed type (HCC) 11/20/2013   Vitamin D deficiency 07/06/2011   CARDIOMYOPATHY, PRIMARY, DILATED 01/01/2009   Malignant tumor of breast (HCC) 07/31/2008   h/o HYPERLIPIDEMIA 10/18/2007   Esophagitis 10/18/2007    GAD (generalized anxiety disorder) 10/12/2007   IBS 10/12/2007   Primary osteoarthritis of both hands 10/12/2007   Osteoporosis 10/12/2007   History of colonic polyps 10/12/2007   MIGRAINES, HX OF 10/12/2007   Fibromyalgia--Dr. Victory Dakin, on Ultram 07/14/2007    Past Medical History:  Diagnosis Date   Anemia    B12 deficient   B12 deficiency    Breast cancer (HCC)    left breast.   Chronic SI joint pain    Bilateral, Dr. Corliss Skains   Fibromyalgia    Dr. Corliss Skains   GERD (gastroesophageal reflux disease)    Greater trochanteric bursitis of both hips    Dr. Corliss Skains   Hyperlipidemia    IBS (irritable bowel syndrome)    LV dysfunction    iatrogenic from chemotherapy ; on Carvedilol    Migraines    Osteoporosis    Dr Lomax---> transferring to a new gyn    Family History  Problem Relation Age of Onset   Diabetes Mother    Breast cancer Mother    Depression Mother        anxiety   Heart disease Mother        in her 8s   Colon cancer Sister    Healthy Daughter    Stroke Neg Hx    Past Surgical History:  Procedure Laterality Date   ABDOMINAL HYSTERECTOMY     BSO for  Endometriosis   CARPAL TUNNEL RELEASE     left   CERVICAL DISC SURGERY     COLONOSCOPY W/ POLYPECTOMY  1999   Dr Kinnie Scales   ESOPHAGEAL DILATION  2005   Dr Kinnie Scales   MASTECTOMY  2009   bilateral, Dr Leafy Ro     Social History  Social History Narrative   Lives w/ husband   Immunization History  Administered Date(s) Administered   Fluad Trivalent(High Dose 65+) 05/11/2023   Influenza Whole 07/14/2007, 05/31/2012   Influenza, High Dose Seasonal PF 07/18/2018   PFIZER(Purple Top)SARS-COV-2 Vaccination 10/30/2019, 11/26/2019   PNEUMOCOCCAL CONJUGATE-20 06/21/2023   Td 04/01/1998   Tdap 10/29/2011     Objective: Vital Signs: There were no vitals taken for this visit.   Physical Exam Vitals and nursing note reviewed.  Constitutional:      Appearance: She is well-developed.   HENT:     Head: Normocephalic and atraumatic.  Eyes:     Conjunctiva/sclera: Conjunctivae normal.  Cardiovascular:     Rate and Rhythm: Normal rate and regular rhythm.     Heart sounds: Normal heart sounds.  Pulmonary:     Effort: Pulmonary effort is normal.     Breath sounds: Normal breath sounds.  Abdominal:     General: Bowel sounds are normal.     Palpations: Abdomen is soft.  Musculoskeletal:     Cervical back: Normal range of motion.  Lymphadenopathy:     Cervical: No cervical adenopathy.  Skin:    General: Skin is warm and dry.     Capillary Refill: Capillary refill takes less than 2 seconds.  Neurological:     Mental Status: She is alert and oriented to person, place, and time.  Psychiatric:        Behavior: Behavior normal.      Musculoskeletal Exam: ***  CDAI Exam: CDAI Score: -- Patient Global: --; Provider Global: -- Swollen: --; Tender: -- Joint Exam 07/21/2023   No joint exam has been documented for this visit   There is currently no information documented on the homunculus. Go to the Rheumatology activity and complete the homunculus joint exam.  Investigation: No additional findings.  Imaging: No results found.  Recent Labs: Lab Results  Component Value Date   WBC 5.9 03/02/2023   HGB 10.8 (L) 03/02/2023   PLT 298 03/02/2023   NA 140 02/15/2023   K 4.3 02/15/2023   CL 99 02/15/2023   CO2 30 (H) 02/15/2023   GLUCOSE 83 02/15/2023   BUN 15 02/15/2023   CREATININE 0.70 02/15/2023   BILITOT 0.3 02/23/2023   ALKPHOS 60 02/23/2023   AST 31 02/23/2023   ALT 19 02/23/2023   PROT 6.3 02/23/2023   ALBUMIN 4.3 02/23/2023   CALCIUM 9.3 02/15/2023   GFRAA 80 06/28/2019    Speciality Comments: Narcotic Agreement Broken- [place]junana found on last UDS.   Procedures:  No procedures performed Allergies: Amoxicillin-pot clavulanate, Doxycycline, and Venlafaxine   Assessment / Plan:     Visit Diagnoses: Trapezius muscle spasm  DDD (degenerative  disc disease), cervical  Spondylosis of lumbar spine  History of scoliosis  Primary osteoarthritis of both hands  Lateral epicondylitis of both elbows  Trochanteric bursitis of both hips  Chronic pain of left knee  Other insomnia  Fibromyalgia  Age-related osteoporosis without current pathological fracture - DEXA on 01/01/2020: The BMD measured at Femur Neck Right is 0.609 g/cm2 with a T-score of -3.1.  I advised repeat DEXA scan which she declined.  History of vitamin D deficiency  History of gastroesophageal reflux (GERD)  History of IBS  History of hyperlipidemia  History of migraine  History of COPD - 3 L of oxygen per nasal cannula.  She was hospitalized in April 2 weeks after her husband passed away for RSV infection. She is followed by Dr. Maple Hudson.  History of anxiety  History of breast cancer - she had bilateral mastectomy, chemotherapy was a stopped due to cardiomyopathy.  She had no radiation therapy.  History of depression  Orders: No orders of the defined types were placed in this encounter.  No orders of the defined types were placed in this encounter.   Face-to-face time spent with patient was *** minutes. Greater than 50% of time was spent in counseling and coordination of care.  Follow-Up Instructions: No follow-ups on file.   Gearldine Bienenstock, PA-C  Note - This record has been created using Dragon software.  Chart creation errors have been sought, but may not always  have been located. Such creation errors do not reflect on  the standard of medical care.

## 2023-07-21 ENCOUNTER — Ambulatory Visit: Payer: Medicare Other | Admitting: Physician Assistant

## 2023-07-21 DIAGNOSIS — Z8639 Personal history of other endocrine, nutritional and metabolic disease: Secondary | ICD-10-CM

## 2023-07-21 DIAGNOSIS — Z8709 Personal history of other diseases of the respiratory system: Secondary | ICD-10-CM

## 2023-07-21 DIAGNOSIS — Z8719 Personal history of other diseases of the digestive system: Secondary | ICD-10-CM

## 2023-07-21 DIAGNOSIS — M7711 Lateral epicondylitis, right elbow: Secondary | ICD-10-CM

## 2023-07-21 DIAGNOSIS — G8929 Other chronic pain: Secondary | ICD-10-CM

## 2023-07-21 DIAGNOSIS — M47816 Spondylosis without myelopathy or radiculopathy, lumbar region: Secondary | ICD-10-CM

## 2023-07-21 DIAGNOSIS — M62838 Other muscle spasm: Secondary | ICD-10-CM

## 2023-07-21 DIAGNOSIS — Z8739 Personal history of other diseases of the musculoskeletal system and connective tissue: Secondary | ICD-10-CM

## 2023-07-21 DIAGNOSIS — M5030 Other cervical disc degeneration, unspecified cervical region: Secondary | ICD-10-CM

## 2023-07-21 DIAGNOSIS — M19041 Primary osteoarthritis, right hand: Secondary | ICD-10-CM

## 2023-07-21 DIAGNOSIS — Z8669 Personal history of other diseases of the nervous system and sense organs: Secondary | ICD-10-CM

## 2023-07-21 DIAGNOSIS — Z853 Personal history of malignant neoplasm of breast: Secondary | ICD-10-CM

## 2023-07-21 DIAGNOSIS — M810 Age-related osteoporosis without current pathological fracture: Secondary | ICD-10-CM

## 2023-07-21 DIAGNOSIS — M797 Fibromyalgia: Secondary | ICD-10-CM

## 2023-07-21 DIAGNOSIS — G4709 Other insomnia: Secondary | ICD-10-CM

## 2023-07-21 DIAGNOSIS — Z8659 Personal history of other mental and behavioral disorders: Secondary | ICD-10-CM

## 2023-07-21 DIAGNOSIS — M7061 Trochanteric bursitis, right hip: Secondary | ICD-10-CM

## 2023-07-23 NOTE — Progress Notes (Unsigned)
Office Visit Note  Patient: [name]             Date of Birth: July 19, 1951           MRN: [id number]             PCP: Melida Quitter, PA Referring: Melida Quitter, PA Visit Date: 07/28/2023 Occupation: @GUAROCC @  Subjective:  Pain in joints and muscles  History of Present Illness: [name] is a 72 y.o. female with osteoarthritis, degenerative disc disease and fibromyalgia syndrome.  She returns today after last visit in July 2024.  She states she has been having pain and discomfort in her neck and bilateral trapezius region.  She wants to have trigger point injections which has been helpful in the past.  She also has some lower back discomfort.  She has ongoing pain in her hands, knees and her feet.  She has not noticed joint swelling.  She continues to have some generalized pain and discomfort from fibromyalgia.  She has intermittent insomnia.  She continues to be on 3 L of oxygen per nasal cannula.  Patient is currently not taking any treatment for osteoporosis.  She states she had a prescription of vitamin D from her PCP which ran out and she is not taking vitamin D.  She has severe vitamin D deficiency.    Activities of Daily Living:  Patient reports morning stiffness for 1 hour.   Patient Reports nocturnal pain.  Difficulty dressing/grooming: Denies Difficulty climbing stairs: Denies Difficulty getting out of chair: Denies Difficulty using hands for taps, buttons, cutlery, and/or writing: Reports  Review of Systems  Constitutional:  Positive for fatigue.  HENT:  Negative for mouth sores and mouth dryness.   Eyes:  Negative for dryness.  Respiratory:  Positive for cough and shortness of breath. Negative for wheezing.   Cardiovascular:  Negative for chest pain and palpitations.  Gastrointestinal:  Negative for blood in stool, constipation and diarrhea.  Endocrine: Negative for increased urination.  Genitourinary:  Negative for involuntary urination.   Musculoskeletal:  Positive for joint pain, joint pain, myalgias, muscle weakness, morning stiffness, muscle tenderness and myalgias. Negative for gait problem and joint swelling.  Skin:  Negative for color change, rash, hair loss and sensitivity to sunlight.  Allergic/Immunologic: Negative for susceptible to infections.  Neurological:  Positive for headaches. Negative for dizziness.  Hematological:  Negative for swollen glands.  Psychiatric/Behavioral:  Positive for sleep disturbance. Negative for depressed mood. The patient is not nervous/anxious.     PMFS History:  Patient Active Problem List   Diagnosis Date Noted   Atrial tachycardia (HCC) 06/05/2023   Chronic respiratory failure with hypoxia (HCC) 02/19/2023   Protein-calorie malnutrition, severe (HCC) 12/24/2022   Tachycardia 12/23/2022   Tachyarrhythmia 12/22/2022   Hypokalemia 12/22/2022   Nasal congestion 09/06/2020   Estrogen deficiency 06/28/2019   Osteopenia after menopause 11/06/2017   MDD (major depressive disorder), recurrent episode, moderate (HCC) 09/22/2017   Suicide attempt (HCC)    B12 deficiency 08/03/2017   Other insomnia 11/23/2016   DDD (degenerative disc disease), cervical 11/23/2016   DDD (degenerative disc disease), lumbar 11/23/2016   Other idiopathic scoliosis, lumbar region 11/23/2016   COPD mixed type (HCC) 11/20/2013   Vitamin D deficiency 07/06/2011   CARDIOMYOPATHY, PRIMARY, DILATED 01/01/2009   Malignant tumor of breast (HCC) 07/31/2008   h/o HYPERLIPIDEMIA 10/18/2007   Esophagitis 10/18/2007   GAD (generalized anxiety disorder) 10/12/2007   IBS 10/12/2007   Primary osteoarthritis of both  hands 10/12/2007   Osteoporosis 10/12/2007   History of colonic polyps 10/12/2007   MIGRAINES, HX OF 10/12/2007   Fibromyalgia--Dr. Victory Dakin, on Ultram 07/14/2007    Past Medical History:  Diagnosis Date   Anemia    B12 deficient   B12 deficiency    Breast cancer (HCC)    left breast.   Chronic  SI joint pain    Bilateral, Dr. Corliss Skains   Fibromyalgia    Dr. Corliss Skains   GERD (gastroesophageal reflux disease)    Greater trochanteric bursitis of both hips    Dr. Corliss Skains   Hyperlipidemia    IBS (irritable bowel syndrome)    LV dysfunction    iatrogenic from chemotherapy ; on Carvedilol    Migraines    Osteoporosis    Dr Lomax---> transferring to a new gyn    Family History  Problem Relation Age of Onset   Diabetes Mother    Breast cancer Mother    Depression Mother        anxiety   Heart disease Mother        in her 59s   Colon cancer Sister    Healthy Daughter    Stroke Neg Hx    Past Surgical History:  Procedure Laterality Date   ABDOMINAL HYSTERECTOMY     BSO for  Endometriosis   CARPAL TUNNEL RELEASE     left   CERVICAL DISC SURGERY     COLONOSCOPY W/ POLYPECTOMY  1999   Dr Kinnie Scales   ESOPHAGEAL DILATION  2005   Dr Kinnie Scales   MASTECTOMY  2009   bilateral, Dr Leafy Ro     Social History   Social History Narrative   Lives w/ husband   Immunization History  Administered Date(s) Administered   Fluad Trivalent(High Dose 65+) 05/11/2023   Influenza Whole 07/14/2007, 05/31/2012   Influenza, High Dose Seasonal PF 07/18/2018   PFIZER(Purple Top)SARS-COV-2 Vaccination 10/30/2019, 11/26/2019   PNEUMOCOCCAL CONJUGATE-20 06/21/2023   Td 04/01/1998   Tdap 10/29/2011     Objective: Vital Signs: BP 114/70 (BP Location: Left Arm, Patient Position: Sitting, Cuff Size: Normal)   Pulse 80   Ht 5' (1.524 m)   Wt 93 lb 9.6 oz (42.5 kg)   BMI 18.28 kg/m    Physical Exam Vitals and nursing note reviewed.  Constitutional:      Appearance: She is well-developed.  HENT:     Head: Normocephalic and atraumatic.  Eyes:     Conjunctiva/sclera: Conjunctivae normal.  Cardiovascular:     Rate and Rhythm: Normal rate and regular rhythm.     Heart sounds: Normal heart sounds.  Pulmonary:     Effort: Pulmonary effort is normal.     Breath sounds: Normal  breath sounds.  Abdominal:     General: Bowel sounds are normal.     Palpations: Abdomen is soft.  Musculoskeletal:     Cervical back: Normal range of motion.  Lymphadenopathy:     Cervical: No cervical adenopathy.  Skin:    General: Skin is warm and dry.     Capillary Refill: Capillary refill takes less than 2 seconds.  Neurological:     Mental Status: She is alert and oriented to person, place, and time.  Psychiatric:        Behavior: Behavior normal.      Musculoskeletal Exam: Patient had limited lateral rotation of the cervical spine.  She had no tenderness over thoracic or lumbar spine.  She had bilateral trapezius spasm.  Shoulders,  elbows, wrist joints with good range of motion without any synovitis.  She had bilateral PIP and DIP thickening.  Hip joints were in good range of motion.  Knee joints were in good range of motion without any warmth swelling or effusion.  There was no tenderness over ankles or MTPs.  CDAI Exam: CDAI Score: -- Patient Global: --; Provider Global: -- Swollen: --; Tender: -- Joint Exam 07/28/2023   No joint exam has been documented for this visit   There is currently no information documented on the homunculus. Go to the Rheumatology activity and complete the homunculus joint exam.  Investigation: No additional findings.  Imaging: No results found.  Recent Labs: Lab Results  Component Value Date   WBC 5.9 03/02/2023   HGB 10.8 (L) 03/02/2023   PLT 298 03/02/2023   NA 140 02/15/2023   K 4.3 02/15/2023   CL 99 02/15/2023   CO2 30 (H) 02/15/2023   GLUCOSE 83 02/15/2023   BUN 15 02/15/2023   CREATININE 0.70 02/15/2023   BILITOT 0.3 02/23/2023   ALKPHOS 60 02/23/2023   AST 31 02/23/2023   ALT 19 02/23/2023   PROT 6.3 02/23/2023   ALBUMIN 4.3 02/23/2023   CALCIUM 9.3 02/15/2023   GFRAA 80 06/28/2019    Speciality Comments: Narcotic Agreement Broken- [place] found on last UDS.   Procedures:  Trigger Point Inj  Date/Time:  07/28/2023 2:14 PM  Performed by: Pollyann Savoy, MD Authorized by: Pollyann Savoy, MD   Consent Given by:  Patient Site marked: the procedure site was marked   Timeout: prior to procedure the correct patient, procedure, and site was verified   Indications:  Muscle spasm and pain Total # of Trigger Points:  2 Location: neck   Needle Size:  27 G Approach:  Dorsal Medications #1:  0.5 mL lidocaine 1 %; 20 mg triamcinolone acetonide 40 MG/ML Medications #2:  0.5 mL lidocaine 1 %; 20 mg triamcinolone acetonide 40 MG/ML Patient tolerance:  Patient tolerated the procedure well with no immediate complications  Allergies: Amoxicillin-pot clavulanate, Doxycycline, and Venlafaxine   Assessment / Plan:     Visit Diagnoses: Trapezius muscle spasm-patient bilateral trapezius spasm.  She requested cortisone injection to the bilateral trapezius region.  She had good response to trigger point injections in the past.  Per patient's request after an informed consent was obtained bilateral trigger point injections were performed as described above.  Patient tolerated the procedure well.  DDD (degenerative disc disease), cervical-she continues to have discomfort and limited range of motion of the cervical spine.  Range of motion exercises were emphasized.  Spondylosis of lumbar spine-she is lower back pain.  Core strength exercises were discussed.  History of scoliosis  Primary osteoarthritis of both hands-she had bilateral PIP and DIP prominence with no synovitis.  Joint protection was discussed.  Lateral epicondylitis of both elbows-resolved.  Trochanteric bursitis of both hips-she continues to have intermittent trochanteric bursitis.  IT band stretches were discussed.  Chronic pain of left knee-she has intermittent discomfort in her knee joint.  No warmth swelling or effusion was noted.  Other insomnia-good sleep hygiene was discussed.  Fibromyalgia-she continues to have some generalized  pain and discomfort.  Need for regular exercise and stretching was discussed.  Age-related osteoporosis without current pathological fracture - DEXA on 01/01/2020: The BMD measured at Femur Neck Right is 0.609 g/cm2 with a T-score of -3.1. -Patient was advised to get repeat DEXA scan with her PCP.  I briefly discussed treatment options for osteoporosis.  Patient states she would like to have dental work completed before initiating treatment.  I advised her to get following labs to her PCP as she has an appointment coming up.  Plan: Parathyroid hormone, intact (no Ca), VITAMIN D 25 Hydroxy (Vit-D Deficiency, Fractures)  History of vitamin D deficiency-patient has history of vitamin D deficiency.  Her vitamin D was 18 on February 23, 2023.  Patient states she had a prescription vitamin D from her PCP.  She did not get a refill on it and has not been taking vitamin D supplement.  I advised her to discuss further with her PCP.  Other medical problems are listed as follows:  History of hyperlipidemia  History of gastroesophageal reflux (GERD)  History of IBS  History of migraine  History of anxiety  History of COPD - she is on 3 L of oxygen per nasal cannula.  She was hospitalized in April 2 weeks after her husband passed away for RSV infection.She is followed by Dr. Maple Hudson.  History of depression  History of breast cancer - she had bilateral mastectomy, chemotherapy was a stopped due to cardiomyopathy.  She had no radiation therapy.  Other fatigue - Plan: TSH, Serum protein electrophoresis with reflex, COMPLETE METABOLIC PANEL WITH GFR  Orders: Orders Placed This Encounter  Procedures   Trigger Point Inj   Parathyroid hormone, intact (no Ca)   VITAMIN D 25 Hydroxy (Vit-D Deficiency, Fractures)   TSH   Serum protein electrophoresis with reflex   COMPLETE METABOLIC PANEL WITH GFR   No orders of the defined types were placed in this encounter.    Follow-Up Instructions: Return in about 6  months (around 01/25/2024) for Osteoarthritis.   Pollyann Savoy, MD  Note - This record has been created using Animal nutritionist.  Chart creation errors have been sought, but may not always  have been located. Such creation errors do not reflect on  the standard of medical care.

## 2023-07-28 ENCOUNTER — Encounter: Payer: Self-pay | Admitting: Rheumatology

## 2023-07-28 ENCOUNTER — Ambulatory Visit: Payer: Medicare Other | Attending: Rheumatology | Admitting: Rheumatology

## 2023-07-28 VITALS — BP 114/70 | HR 80 | Ht 60.0 in | Wt 93.6 lb

## 2023-07-28 DIAGNOSIS — Z8709 Personal history of other diseases of the respiratory system: Secondary | ICD-10-CM

## 2023-07-28 DIAGNOSIS — M7062 Trochanteric bursitis, left hip: Secondary | ICD-10-CM

## 2023-07-28 DIAGNOSIS — M62838 Other muscle spasm: Secondary | ICD-10-CM

## 2023-07-28 DIAGNOSIS — Z8669 Personal history of other diseases of the nervous system and sense organs: Secondary | ICD-10-CM

## 2023-07-28 DIAGNOSIS — Z8719 Personal history of other diseases of the digestive system: Secondary | ICD-10-CM | POA: Diagnosis not present

## 2023-07-28 DIAGNOSIS — Z8739 Personal history of other diseases of the musculoskeletal system and connective tissue: Secondary | ICD-10-CM

## 2023-07-28 DIAGNOSIS — R5383 Other fatigue: Secondary | ICD-10-CM

## 2023-07-28 DIAGNOSIS — M7711 Lateral epicondylitis, right elbow: Secondary | ICD-10-CM

## 2023-07-28 DIAGNOSIS — M25562 Pain in left knee: Secondary | ICD-10-CM | POA: Diagnosis not present

## 2023-07-28 DIAGNOSIS — M47816 Spondylosis without myelopathy or radiculopathy, lumbar region: Secondary | ICD-10-CM

## 2023-07-28 DIAGNOSIS — Z853 Personal history of malignant neoplasm of breast: Secondary | ICD-10-CM

## 2023-07-28 DIAGNOSIS — Z8659 Personal history of other mental and behavioral disorders: Secondary | ICD-10-CM

## 2023-07-28 DIAGNOSIS — M810 Age-related osteoporosis without current pathological fracture: Secondary | ICD-10-CM

## 2023-07-28 DIAGNOSIS — Z8639 Personal history of other endocrine, nutritional and metabolic disease: Secondary | ICD-10-CM

## 2023-07-28 DIAGNOSIS — M797 Fibromyalgia: Secondary | ICD-10-CM

## 2023-07-28 DIAGNOSIS — M5030 Other cervical disc degeneration, unspecified cervical region: Secondary | ICD-10-CM | POA: Diagnosis not present

## 2023-07-28 DIAGNOSIS — G4709 Other insomnia: Secondary | ICD-10-CM

## 2023-07-28 DIAGNOSIS — M19042 Primary osteoarthritis, left hand: Secondary | ICD-10-CM

## 2023-07-28 DIAGNOSIS — M7061 Trochanteric bursitis, right hip: Secondary | ICD-10-CM | POA: Diagnosis not present

## 2023-07-28 DIAGNOSIS — G8929 Other chronic pain: Secondary | ICD-10-CM

## 2023-07-28 DIAGNOSIS — M19041 Primary osteoarthritis, right hand: Secondary | ICD-10-CM

## 2023-07-28 MED ORDER — LIDOCAINE HCL 1 % IJ SOLN
0.5000 mL | INTRAMUSCULAR | Status: AC | PRN
  Administered 2023-07-28: .5 mL

## 2023-07-28 MED ORDER — TRIAMCINOLONE ACETONIDE 40 MG/ML IJ SUSP
20.0000 mg | INTRAMUSCULAR | Status: AC | PRN
  Administered 2023-07-28: 20 mg via INTRAMUSCULAR

## 2023-07-28 NOTE — Patient Instructions (Signed)
Please ask your PCP to order a DEXA scan to monitor bone density.  You have history of osteoporosis.

## 2023-08-15 ENCOUNTER — Other Ambulatory Visit: Payer: Self-pay | Admitting: Family Medicine

## 2023-08-15 DIAGNOSIS — E559 Vitamin D deficiency, unspecified: Secondary | ICD-10-CM

## 2023-08-30 ENCOUNTER — Other Ambulatory Visit: Payer: Medicare Other

## 2023-08-30 DIAGNOSIS — E782 Mixed hyperlipidemia: Secondary | ICD-10-CM | POA: Diagnosis not present

## 2023-08-30 DIAGNOSIS — F331 Major depressive disorder, recurrent, moderate: Secondary | ICD-10-CM

## 2023-08-30 DIAGNOSIS — F411 Generalized anxiety disorder: Secondary | ICD-10-CM

## 2023-08-30 DIAGNOSIS — D649 Anemia, unspecified: Secondary | ICD-10-CM

## 2023-08-31 LAB — CBC WITH DIFFERENTIAL/PLATELET
Basophils Absolute: 0 10*3/uL (ref 0.0–0.2)
Basos: 1 %
EOS (ABSOLUTE): 0.1 10*3/uL (ref 0.0–0.4)
Eos: 3 %
Hematocrit: 33.4 % — ABNORMAL LOW (ref 34.0–46.6)
Hemoglobin: 11.1 g/dL (ref 11.1–15.9)
Immature Grans (Abs): 0 10*3/uL (ref 0.0–0.1)
Immature Granulocytes: 0 %
Lymphocytes Absolute: 1.8 10*3/uL (ref 0.7–3.1)
Lymphs: 37 %
MCH: 31.5 pg (ref 26.6–33.0)
MCHC: 33.2 g/dL (ref 31.5–35.7)
MCV: 95 fL (ref 79–97)
Monocytes Absolute: 0.6 10*3/uL (ref 0.1–0.9)
Monocytes: 11 %
Neutrophils Absolute: 2.4 10*3/uL (ref 1.4–7.0)
Neutrophils: 48 %
Platelets: 348 10*3/uL (ref 150–450)
RBC: 3.52 x10E6/uL — ABNORMAL LOW (ref 3.77–5.28)
RDW: 12.2 % (ref 11.7–15.4)
WBC: 5 10*3/uL (ref 3.4–10.8)

## 2023-08-31 LAB — COMPREHENSIVE METABOLIC PANEL
ALT: 12 [IU]/L (ref 0–32)
AST: 24 [IU]/L (ref 0–40)
Albumin: 4.4 g/dL (ref 3.8–4.8)
Alkaline Phosphatase: 55 [IU]/L (ref 44–121)
BUN/Creatinine Ratio: 27 (ref 12–28)
BUN: 21 mg/dL (ref 8–27)
Bilirubin Total: 0.3 mg/dL (ref 0.0–1.2)
CO2: 29 mmol/L (ref 20–29)
Calcium: 9.7 mg/dL (ref 8.7–10.3)
Chloride: 99 mmol/L (ref 96–106)
Creatinine, Ser: 0.78 mg/dL (ref 0.57–1.00)
Globulin, Total: 2.4 g/dL (ref 1.5–4.5)
Glucose: 84 mg/dL (ref 70–99)
Potassium: 4.3 mmol/L (ref 3.5–5.2)
Sodium: 142 mmol/L (ref 134–144)
Total Protein: 6.8 g/dL (ref 6.0–8.5)
eGFR: 80 mL/min/{1.73_m2} (ref >59)

## 2023-08-31 LAB — LIPID PANEL
Chol/HDL Ratio: 2.8 {ratio} (ref 0.0–4.4)
Cholesterol, Total: 267 mg/dL — ABNORMAL HIGH (ref 100–199)
HDL: 96 mg/dL (ref >39)
LDL Chol Calc (NIH): 160 mg/dL — ABNORMAL HIGH (ref 0–99)
Triglycerides: 67 mg/dL (ref 0–149)
VLDL Cholesterol Cal: 11 mg/dL (ref 5–40)

## 2023-09-02 ENCOUNTER — Ambulatory Visit: Payer: Medicare Other | Admitting: Family Medicine

## 2023-09-06 ENCOUNTER — Ambulatory Visit: Payer: Medicare Other | Admitting: Family Medicine

## 2023-09-16 ENCOUNTER — Encounter: Payer: Self-pay | Admitting: Family Medicine

## 2023-09-16 ENCOUNTER — Ambulatory Visit (INDEPENDENT_AMBULATORY_CARE_PROVIDER_SITE_OTHER): Payer: Medicare Other | Admitting: Family Medicine

## 2023-09-16 VITALS — BP 122/71 | HR 84 | Ht 60.0 in | Wt 96.0 lb

## 2023-09-16 DIAGNOSIS — J9611 Chronic respiratory failure with hypoxia: Secondary | ICD-10-CM

## 2023-09-16 DIAGNOSIS — F411 Generalized anxiety disorder: Secondary | ICD-10-CM | POA: Diagnosis not present

## 2023-09-16 DIAGNOSIS — Z1211 Encounter for screening for malignant neoplasm of colon: Secondary | ICD-10-CM | POA: Diagnosis not present

## 2023-09-16 DIAGNOSIS — Z1212 Encounter for screening for malignant neoplasm of rectum: Secondary | ICD-10-CM

## 2023-09-16 DIAGNOSIS — J449 Chronic obstructive pulmonary disease, unspecified: Secondary | ICD-10-CM | POA: Diagnosis not present

## 2023-09-16 DIAGNOSIS — E782 Mixed hyperlipidemia: Secondary | ICD-10-CM

## 2023-09-16 DIAGNOSIS — F331 Major depressive disorder, recurrent, moderate: Secondary | ICD-10-CM

## 2023-09-16 NOTE — Assessment & Plan Note (Signed)
Stable, continue Celexa 40 mg daily.  Will continue to monitor.

## 2023-09-16 NOTE — Assessment & Plan Note (Signed)
Benefits from oxygen: sleep and exertion.  Continue 3L oxygen via nasal cannula.

## 2023-09-16 NOTE — Patient Instructions (Addendum)
I am going to get in touch with Dr. Corliss Skains about pain medicine to see if she recommends tramadol or thinks that another option might work better for you. I will let you know!  PREVENTATIVE CARE: At pharmacy, please update your tetanus booster! If you have not done so already, I would also recommend considering the 2 dose shingles shots to protect yourself from shingles.

## 2023-09-16 NOTE — Assessment & Plan Note (Signed)
Continue routine follow-up with pulmonology.  For now, continue Ellipta maintenance inhaler and albuterol rescue inhaler.  Per most recent pulmonology note, they may consider a nebulizer and reconsider maintenance inhaler.  Encourage patient to discuss this with her pulmonologist at their next appointment given her daily use of albuterol inhaler.

## 2023-09-16 NOTE — Assessment & Plan Note (Signed)
Last lipid panel: LDL 160, HDL 96, triglycerides 67. LDL increased to 160, likely due to the holidays.

## 2023-09-16 NOTE — Progress Notes (Signed)
Established Patient Office Visit  Subjective   Patient ID: [name], female    DOB: 1950-11-01  Age: 73 y.o. MRN: [id number]  Chief Complaint  Patient presents with   Hyperlipidemia   Mood Follow Up   COPD    HPI [name] is a 73 y.o. female presenting today for follow up of hyperlipidemia, COPD, mood. Hyperlipidemia: Currently consuming a low fat diet. Exercise is limited by respiratory condition(s): COPD.  She notes that her eating habits did change from baseline over the holidays the last few months. The 10-year ASCVD risk score (Arnett DK, et al., 2019) is: 12.1% COPD: Currently using Ellipta inhaler for maintenance as prescribed by pulmonology, uses rescue inhaler almost daily. She reports excellent compliance with treatment. She is not having side effects. She reports breathing is Unchanged. She IS experiencing dyspnea prompting the need for rescue inhaler, denies cough, wheezing, fatigue, chills, fever, increased sputum, colored sputum, or weight changes. Using 3 L of oxygen per nasal cannula. Mood: Patient is here to follow up for depression and anxiety, currently managing with Celexa. Taking medication without side effects, reports excellent compliance with treatment. Denies mood changes or SI/HI. She feels mood is stable since last visit.     09/16/2023    8:14 AM 05/11/2023   11:18 AM 03/02/2023   10:19 AM  Depression screen PHQ 2/9  Decreased Interest 1 0 0  Down, Depressed, Hopeless 0 0 0  PHQ - 2 Score 1 0 0  Altered sleeping 3  1  Tired, decreased energy 3  1  Change in appetite 0  0  Feeling bad or failure about yourself  0  0  Trouble concentrating 0  1  Moving slowly or fidgety/restless 0  0  Suicidal thoughts 0  0  PHQ-9 Score 7  3  Difficult doing work/chores   Somewhat difficult       09/16/2023    8:14 AM 03/02/2023   10:22 AM 12/31/2022    2:30 PM 09/21/2019    9:50 AM  GAD 7 : Generalized Anxiety Score  Nervous, Anxious, on Edge 0 0 1 1   Control/stop worrying 0 0 0 0  Worry too much - different things 0 0 0 1  Trouble relaxing 1 1 2 3   Restless 0 0 0 1  Easily annoyed or irritable 0 1 3 1   Afraid - awful might happen 0 1 0 3  Total GAD 7 Score 1 3 6 10   Anxiety Difficulty Not difficult at all Not difficult at all Very difficult Not difficult at all     Outpatient Medications Prior to Visit  Medication Sig   albuterol (VENTOLIN HFA) 108 (90 Base) MCG/ACT inhaler Inhale 2 puffs every 6 hours as needed   Calcium Carbonate (CALTRATE 600 PO) Take by mouth.   citalopram (CELEXA) 40 MG tablet Take 40 mg by mouth daily.   diltiazem (CARDIZEM CD) 120 MG 24 hr capsule Take 1 capsule (120 mg total) by mouth daily.   hydrOXYzine (ATARAX/VISTARIL) 10 MG tablet Take 10 mg by mouth every 6 (six) hours as needed for anxiety.   Magnesium 400 MG CAPS Take by mouth.   methocarbamol (ROBAXIN) 750 MG tablet TAKE 1 TABLET BY MOUTH 2 TIMES DAILY AS NEEDED FOR MUSCLE SPASMS (NON FORMULARY)   Multiple Vitamins-Minerals (MULTIVITAMIN WITH MINERALS) tablet Take 1 tablet by mouth daily.   umeclidinium bromide (INCRUSE ELLIPTA) 62.5 MCG/ACT AEPB Inhale 1 puff into the lungs daily.   [DISCONTINUED]  citalopram (CELEXA) 40 MG tablet 1po qd Filled by Arlana Hove-  Nurse Practitioner, [facility] (Patient taking differently: Take 40 mg by mouth daily.)   No facility-administered medications prior to visit.    ROS Negative unless otherwise noted in HPI   Objective:     BP 122/71   Pulse 84   Ht 5' (1.524 m)   Wt 96 lb (43.5 kg)   SpO2 96%   BMI 18.75 kg/m   Physical Exam Constitutional:      General: She is not in acute distress.    Appearance: Normal appearance.     Comments: Nasal cannula in place  HENT:     Head: Normocephalic and atraumatic.  Cardiovascular:     Rate and Rhythm: Normal rate and regular rhythm.     Heart sounds: No murmur heard.    No friction rub. No gallop.  Pulmonary:     Effort: Pulmonary effort  is normal. No respiratory distress.     Breath sounds: No wheezing, rhonchi or rales.  Skin:    General: Skin is warm and dry.  Neurological:     Mental Status: She is alert and oriented to person, place, and time.      Assessment & Plan:  Mixed hyperlipidemia Assessment & Plan: Last lipid panel: LDL 160, HDL 96, triglycerides 67. LDL increased to 160, likely due to the holidays.   COPD mixed type [facility]) Assessment & Plan: Continue routine follow-up with pulmonology.  For now, continue Ellipta maintenance inhaler and albuterol rescue inhaler.  Per most recent pulmonology note, they may consider a nebulizer and reconsider maintenance inhaler.  Encourage patient to discuss this with her pulmonologist at their next appointment given her daily use of albuterol inhaler.   Chronic respiratory failure with hypoxia (HCC) Assessment & Plan: Benefits from oxygen: sleep and exertion.  Continue 3L oxygen via nasal cannula.   GAD (generalized anxiety disorder) Assessment & Plan: Followed by Vesta Mixer, Celexa prescribed by Arlana Hove. Stable, continue Celexa 40 mg daily.   MDD (major depressive disorder), recurrent episode, moderate (HCC) Assessment & Plan: Stable, continue Celexa 40 mg daily.  Will continue to monitor.   Screening for colorectal cancer -     Ambulatory referral to Gastroenterology    Return in about 6 months (around 03/15/2024) for annual physical, fasting labs 1 week before.    Melida Quitter, PA

## 2023-09-16 NOTE — Assessment & Plan Note (Addendum)
Followed by Vesta Mixer, Celexa prescribed by Arlana Hove. Stable, continue Celexa 40 mg daily.

## 2023-10-04 ENCOUNTER — Other Ambulatory Visit: Payer: Self-pay

## 2023-10-04 ENCOUNTER — Emergency Department (HOSPITAL_BASED_OUTPATIENT_CLINIC_OR_DEPARTMENT_OTHER): Payer: Medicare Other | Admitting: Radiology

## 2023-10-04 ENCOUNTER — Encounter (HOSPITAL_BASED_OUTPATIENT_CLINIC_OR_DEPARTMENT_OTHER): Payer: Self-pay | Admitting: Emergency Medicine

## 2023-10-04 ENCOUNTER — Other Ambulatory Visit: Payer: Self-pay | Admitting: Rheumatology

## 2023-10-04 ENCOUNTER — Emergency Department (HOSPITAL_BASED_OUTPATIENT_CLINIC_OR_DEPARTMENT_OTHER)
Admission: EM | Admit: 2023-10-04 | Discharge: 2023-10-05 | Disposition: A | Discharge status: Home / Self Care | Payer: Medicare Other | Attending: Emergency Medicine | Admitting: Emergency Medicine

## 2023-10-04 DIAGNOSIS — Z87891 Personal history of nicotine dependence: Secondary | ICD-10-CM | POA: Insufficient documentation

## 2023-10-04 DIAGNOSIS — S82841A Displaced bimalleolar fracture of right lower leg, initial encounter for closed fracture: Secondary | ICD-10-CM

## 2023-10-04 DIAGNOSIS — W010XXA Fall on same level from slipping, tripping and stumbling without subsequent striking against object, initial encounter: Secondary | ICD-10-CM | POA: Diagnosis not present

## 2023-10-04 DIAGNOSIS — J449 Chronic obstructive pulmonary disease, unspecified: Secondary | ICD-10-CM | POA: Diagnosis not present

## 2023-10-04 DIAGNOSIS — S0003XA Contusion of scalp, initial encounter: Secondary | ICD-10-CM | POA: Insufficient documentation

## 2023-10-04 DIAGNOSIS — S0990XA Unspecified injury of head, initial encounter: Secondary | ICD-10-CM | POA: Diagnosis not present

## 2023-10-04 DIAGNOSIS — R55 Syncope and collapse: Secondary | ICD-10-CM | POA: Diagnosis not present

## 2023-10-04 DIAGNOSIS — Z79899 Other long term (current) drug therapy: Secondary | ICD-10-CM | POA: Diagnosis not present

## 2023-10-04 DIAGNOSIS — Z853 Personal history of malignant neoplasm of breast: Secondary | ICD-10-CM | POA: Insufficient documentation

## 2023-10-04 DIAGNOSIS — S99911A Unspecified injury of right ankle, initial encounter: Secondary | ICD-10-CM | POA: Diagnosis present

## 2023-10-04 DIAGNOSIS — S82451A Displaced comminuted fracture of shaft of right fibula, initial encounter for closed fracture: Secondary | ICD-10-CM | POA: Diagnosis not present

## 2023-10-04 DIAGNOSIS — W19XXXA Unspecified fall, initial encounter: Secondary | ICD-10-CM

## 2023-10-04 HISTORY — DX: Dependence on supplemental oxygen: Z99.81

## 2023-10-04 NOTE — Telephone Encounter (Signed)
Last Fill: 07/09/2023  Next Visit: 01/25/2023  Last Visit: 07/28/2023  Dx: Trapezius muscle spasm   Current Dose per office note on 07/28/2023: not discussed  Okay to refill Methocarbamol?

## 2023-10-04 NOTE — ED Triage Notes (Addendum)
Patient presents with right ankle pain/swelling s/p fall few hours ago.  Reports feeling "hot" prior to fall. Hematoma noted to back of head. Denies loc.  Patient admits to taking "2 hydrocodone" approx 1 hour prior to fall

## 2023-10-05 ENCOUNTER — Other Ambulatory Visit: Payer: Self-pay

## 2023-10-05 ENCOUNTER — Emergency Department (HOSPITAL_BASED_OUTPATIENT_CLINIC_OR_DEPARTMENT_OTHER): Payer: Medicare Other | Admitting: Radiology

## 2023-10-05 ENCOUNTER — Emergency Department (HOSPITAL_BASED_OUTPATIENT_CLINIC_OR_DEPARTMENT_OTHER): Payer: Medicare Other

## 2023-10-05 DIAGNOSIS — Z981 Arthrodesis status: Secondary | ICD-10-CM | POA: Diagnosis not present

## 2023-10-05 DIAGNOSIS — Z043 Encounter for examination and observation following other accident: Secondary | ICD-10-CM | POA: Diagnosis not present

## 2023-10-05 LAB — CBC WITH DIFFERENTIAL/PLATELET
Abs Immature Granulocytes: 0.02 10*3/uL (ref 0.00–0.07)
Basophils Absolute: 0 10*3/uL (ref 0.0–0.1)
Basophils Relative: 0 %
Eosinophils Absolute: 0 10*3/uL (ref 0.0–0.5)
Eosinophils Relative: 0 %
HCT: 30.9 % — ABNORMAL LOW (ref 36.0–46.0)
Hemoglobin: 10 g/dL — ABNORMAL LOW (ref 12.0–15.0)
Immature Granulocytes: 0 %
Lymphocytes Relative: 16 %
Lymphs Abs: 1.3 10*3/uL (ref 0.7–4.0)
MCH: 30.5 pg (ref 26.0–34.0)
MCHC: 32.4 g/dL (ref 30.0–36.0)
MCV: 94.2 fL (ref 80.0–100.0)
Monocytes Absolute: 0.8 10*3/uL (ref 0.1–1.0)
Monocytes Relative: 10 %
Neutro Abs: 6.1 10*3/uL (ref 1.7–7.7)
Neutrophils Relative %: 74 %
Platelets: 243 10*3/uL (ref 150–400)
RBC: 3.28 MIL/uL — ABNORMAL LOW (ref 3.87–5.11)
RDW: 12.8 % (ref 11.5–15.5)
WBC: 8.3 10*3/uL (ref 4.0–10.5)
nRBC: 0 % (ref 0.0–0.2)

## 2023-10-05 LAB — BASIC METABOLIC PANEL
Anion gap: 7 (ref 5–15)
BUN: 24 mg/dL — ABNORMAL HIGH (ref 8–23)
CO2: 31 mmol/L (ref 22–32)
Calcium: 9.2 mg/dL (ref 8.9–10.3)
Chloride: 99 mmol/L (ref 98–111)
Creatinine, Ser: 0.67 mg/dL (ref 0.44–1.00)
GFR, Estimated: >60 mL/min (ref >60)
Glucose, Bld: 105 mg/dL — ABNORMAL HIGH (ref 70–99)
Potassium: 3.9 mmol/L (ref 3.5–5.1)
Sodium: 137 mmol/L (ref 135–145)

## 2023-10-05 LAB — TROPONIN I (HIGH SENSITIVITY): Troponin I (High Sensitivity): 4 ng/L (ref <18)

## 2023-10-05 MED ORDER — HYDROCODONE-ACETAMINOPHEN 5-325 MG PO TABS
1.0000 | ORAL_TABLET | Freq: Once | ORAL | Status: AC
  Administered 2023-10-05: 1 via ORAL
  Filled 2023-10-05: qty 1

## 2023-10-05 NOTE — ED Notes (Signed)
 Patient transported to CT

## 2023-10-05 NOTE — Discharge Instructions (Addendum)
 Based on the events which brought you to the ER today, it is possible that you may have a concussion. A concussion occurs when there is a blow to the head or body, with enough force to shake the brain and disrupt how the brain functions. You may experience symptoms such as headaches, sensitivity to light/noise, dizziness, cognitive slowing, difficulty concentrating / remembering, trouble sleeping and drowsiness. These symptoms may last anywhere from hours/days to potentially weeks/months. While these symptoms are very frustrating and perhaps debilitating, it is important that you remember that they will improve over time. Everyone has a different rate of recovery; it is difficult to predict when your symptoms will resolve. In order to allow for your brain to heal after the injury, we recommend that you see your primary physician or a physician knowledgeable in concussion management. We also advise you to let your body and brain rest: avoid physical activities (sports, gym, and exercise) and reduce cognitive demands (reading, texting, TV watching, computer use, video games, etc). School attendance, after-school activities and work may need to be modified to avoid increasing symptoms. We recommend against driving until until all symptoms have resolved. Come back to the ER right away if you are having repeated episodes of vomiting, severe/worsening headache/dizziness or any other symptom that alarms you. We recommended that someone stay with you for the next 24 hours to monitor for these worrisome symptoms.   It was a pleasure caring for you today in the emergency department.  Please return to the emergency department for any worsening or worrisome symptoms.

## 2023-10-05 NOTE — ED Provider Notes (Addendum)
 [place] EMERGENCY DEPARTMENT AT [facility] Provider Note  CSN: [id number] Arrival date & time: 10/04/23 2140  Chief Complaint(s) Dizziness and Fall  HPI [name] is a 73 y.o. female with past medical history as below, significant for fibromyalgia, chronic neck pain, IBS, migraines, osteoporosis, 3 L O2 at all times who presents to the ED with complaint of fall, head injury, ankle injury  Patient reports she had dental procedure done earlier today, she took oxycodone  x 2 when she got home.  Around 6 or 7 PM she felt hot, turned around and fell to the ground. Thinks she might have tripped over her slippers but not sure.  Does not believe she had LOC.  No chest pain or palpitations preceding or following the episode.  Unable to get up off the ground secondary to right ankle injury.  Also hit the back of her head on the ground.  No nausea or vomiting.  No chest pain, dyspnea or palpitations.  Similar episode in the past when she got hot and had LOC.  No LOC but he  Past Medical History Past Medical History:  Diagnosis Date   Anemia    B12 deficient   B12 deficiency    Breast cancer (HCC)    left breast.   Chronic SI joint pain    Bilateral, Dr. Dolphus   Fibromyalgia    Dr. Dolphus   GERD (gastroesophageal reflux disease)    Greater trochanteric bursitis of both hips    Dr. Dolphus   Hyperlipidemia    IBS (irritable bowel syndrome)    LV dysfunction    iatrogenic from chemotherapy ; on Carvedilol    Migraines    Osteoporosis    Dr Lomax---> transferring to a new gyn   Oxygen  dependent    3 liters   Patient Active Problem List   Diagnosis Date Noted   Atrial tachycardia (HCC) 06/05/2023   Chronic respiratory failure with hypoxia (HCC) 02/19/2023   Protein-calorie malnutrition, severe (HCC) 12/24/2022   Tachyarrhythmia 12/22/2022   Hypokalemia 12/22/2022   Estrogen deficiency 06/28/2019   Osteopenia after menopause 11/06/2017   MDD (major depressive  disorder), recurrent episode, moderate (HCC) 09/22/2017   Suicide attempt (HCC)    B12 deficiency 08/03/2017   Other insomnia 11/23/2016   DDD (degenerative disc disease), cervical 11/23/2016   DDD (degenerative disc disease), lumbar 11/23/2016   Other idiopathic scoliosis, lumbar region 11/23/2016   COPD mixed type (HCC) 11/20/2013   Vitamin D  deficiency 07/06/2011   CARDIOMYOPATHY, PRIMARY, DILATED 01/01/2009   Malignant tumor of breast (HCC) 07/31/2008   Mixed hyperlipidemia 10/18/2007   Esophagitis 10/18/2007   GAD (generalized anxiety disorder) 10/12/2007   IBS 10/12/2007   Primary osteoarthritis of both hands 10/12/2007   Osteoporosis 10/12/2007   History of colonic polyps 10/12/2007   MIGRAINES, HX OF 10/12/2007   Fibromyalgia--Dr. Margarita, on Ultram 07/14/2007   Home Medication(s) Prior to Admission medications   Medication Sig Start Date End Date Taking? Authorizing Provider  albuterol  (VENTOLIN  HFA) 108 (90 Base) MCG/ACT inhaler Inhale 2 puffs every 6 hours as needed 02/19/23   Neysa Rama D, MD  Calcium  Carbonate (CALTRATE 600 PO) Take by mouth.    [provider]  citalopram  (CELEXA ) 40 MG tablet Take 40 mg by mouth daily.    Amematsro, Fred, NP  diltiazem  (CARDIZEM  CD) 120 MG 24 hr capsule Take 1 capsule (120 mg total) by mouth daily. 02/12/23   Dunn, Dayna N, PA-C  hydrOXYzine  (ATARAX /VISTARIL ) 10 MG tablet  Take 10 mg by mouth every 6 (six) hours as needed for anxiety. 05/31/20   [provider]  Magnesium  400 MG CAPS Take by mouth.    [provider]  methocarbamol  (ROBAXIN ) 750 MG tablet TAKE 1 TABLET BY MOUTH 2 TIMES DAILY AS NEEDED FOR MUSCLE SPASMS (NON FORMULARY) 10/04/23   Dolphus Reiter, MD  Multiple Vitamins-Minerals (MULTIVITAMIN WITH MINERALS) tablet Take 1 tablet by mouth daily.    [provider]  umeclidinium bromide  (INCRUSE ELLIPTA ) 62.5 MCG/ACT AEPB Inhale 1 puff into the lungs daily. 02/19/23   Neysa Reggy BIRCH, MD                                                                                                                                     Past Surgical History Past Surgical History:  Procedure Laterality Date   ABDOMINAL HYSTERECTOMY     BSO for  Endometriosis   CARPAL TUNNEL RELEASE     left   CERVICAL DISC SURGERY     COLONOSCOPY W/ POLYPECTOMY  1999   Dr Luis   ESOPHAGEAL DILATION  2005   Dr Luis   MASTECTOMY  2009   bilateral, Dr Merrilyn REPINE     Family History Family History  Problem Relation Age of Onset   Diabetes Mother    Breast cancer Mother    Depression Mother        anxiety   Heart disease Mother        in her 26s   Colon cancer Sister    Healthy Daughter    Stroke Neg Hx     Social History Social History   Tobacco Use   Smoking status: Former    Current packs/day: 0.00    Average packs/day: 1 pack/day for 30.0 years (30.0 ttl pk-yrs)    Types: Cigarettes    Start date: 08/31/1960    Quit date: 08/31/1990    Years since quitting: 33.1    Passive exposure: Never   Smokeless tobacco: Never  Vaping Use   Vaping status: Never Used  Substance Use Topics   Alcohol use: No   Drug use: Not Currently    Types: Marijuana    Comment: 08/01/2017 last used    Allergies Amoxicillin-pot clavulanate, Doxycycline, and Venlafaxine  Review of Systems Review of Systems  Constitutional:  Negative for chills and fever.  Respiratory:  Negative for chest tightness and shortness of breath.   Cardiovascular:  Negative for chest pain and palpitations.  Gastrointestinal:  Negative for nausea and vomiting.  Genitourinary:  Negative for dysuria.  Musculoskeletal:  Positive for arthralgias, joint swelling and neck pain.  Skin:  Positive for color change.  Neurological:  Positive for headaches.  Psychiatric/Behavioral:  Negative for agitation.   All other systems reviewed and are negative.   Physical Exam Vital Signs  I have reviewed the triage vital signs BP  116/66   Pulse 77  Temp 98.7 F (37.1 C) (Oral)   Resp 16   Ht 5' (1.524 m)   Wt 41.7 kg   SpO2 100%   BMI 17.97 kg/m  Physical Exam Vitals and nursing note reviewed.  Constitutional:      General: She is not in acute distress.    Appearance: Normal appearance.  HENT:     Head: Normocephalic.     Comments: Hematoma occiput    Right Ear: External ear normal.     Left Ear: External ear normal.     Nose: Nose normal.     Mouth/Throat:     Mouth: Mucous membranes are moist.  Eyes:     General: No scleral icterus.       Right eye: No discharge.        Left eye: No discharge.     Extraocular Movements: Extraocular movements intact.     Pupils: Pupils are equal, round, and reactive to light.  Neck:   Cardiovascular:     Rate and Rhythm: Normal rate and regular rhythm.     Pulses: Normal pulses.     Heart sounds: Normal heart sounds.  Pulmonary:     Effort: Pulmonary effort is normal. No respiratory distress.     Breath sounds: Normal breath sounds. No stridor.  Abdominal:     General: Abdomen is flat. There is no distension.     Palpations: Abdomen is soft.     Tenderness: There is no abdominal tenderness.  Musculoskeletal:     Cervical back: No rigidity.     Right lower leg: No edema.     Left lower leg: No edema.       Feet:  Feet:     Comments: Bruising and swelling noted to right ankle.  Achilles tendon intact.  DP pulse intact  She has mild TTP to Skin:    General: Skin is warm and dry.     Capillary Refill: Capillary refill takes less than 2 seconds.  Neurological:     Mental Status: She is alert and oriented to person, place, and time. Mental status is at baseline.     GCS: GCS eye subscore is 4. GCS verbal subscore is 5. GCS motor subscore is 6.     Cranial Nerves: Cranial nerves 2-12 are intact.     Sensory: Sensation is intact.     Motor: Motor function is intact.     Coordination: Coordination is intact.     Comments: Gait testing deferred secondary  to patient safety. Strength 5/5 to BLUE/BLLE, equal and symmetric    Psychiatric:        Mood and Affect: Mood normal.        Behavior: Behavior normal. Behavior is cooperative.     ED Results and Treatments Labs (all labs ordered are listed, but only abnormal results are displayed) Labs Reviewed  CBC WITH DIFFERENTIAL/PLATELET - Abnormal; Notable for the following components:      Result Value   RBC 3.28 (*)    Hemoglobin 10.0 (*)    HCT 30.9 (*)    All other components within normal limits  BASIC METABOLIC PANEL - Abnormal; Notable for the following components:   Glucose, Bld 105 (*)    BUN 24 (*)    All other components within normal limits  TROPONIN I (HIGH SENSITIVITY)  Radiology CT Head Wo Contrast Result Date: 10/05/2023 CLINICAL DATA:  Fall EXAM: CT HEAD WITHOUT CONTRAST CT CERVICAL SPINE WITHOUT CONTRAST TECHNIQUE: Multidetector CT imaging of the head and cervical spine was performed following the standard protocol without intravenous contrast. Multiplanar CT image reconstructions of the cervical spine were also generated. RADIATION DOSE REDUCTION: This exam was performed according to the departmental dose-optimization program which includes automated exposure control, adjustment of the mA and/or kV according to patient size and/or use of iterative reconstruction technique. COMPARISON:  None Available. FINDINGS: CT HEAD FINDINGS Brain: No mass,hemorrhage or extra-axial collection. Normal appearance of the parenchyma and CSF spaces. Vascular: No hyperdense vessel or unexpected vascular calcification. Skull: The visualized skull base, calvarium and extracranial soft tissues are normal. Sinuses/Orbits: Right frontal and ethmoid sinus opacification. Small amount of fluid in the right maxillary sinus. Mastoids are clear. Other: None. CT CERVICAL SPINE FINDINGS  Alignment: Grade 1 anterolisthesis at C3-4 and C4-5 Skull base and vertebrae: C5-7 ACDF.  No acute fracture. Soft tissues and spinal canal: No prevertebral fluid or swelling. No visible canal hematoma. Disc levels: No advanced spinal canal or neural foraminal stenosis. Upper chest: No pneumothorax, pulmonary nodule or pleural effusion. Other: Normal visualized paraspinal cervical soft tissues. IMPRESSION: 1. No acute intracranial abnormality. 2. No acute fracture or static subluxation of the cervical spine. 3. C5-7 ACDF without hardware complication. Electronically Signed   By: Franky Stanford M.D.   On: 10/05/2023 02:16   CT Cervical Spine Wo Contrast Result Date: 10/05/2023 CLINICAL DATA:  Fall EXAM: CT HEAD WITHOUT CONTRAST CT CERVICAL SPINE WITHOUT CONTRAST TECHNIQUE: Multidetector CT imaging of the head and cervical spine was performed following the standard protocol without intravenous contrast. Multiplanar CT image reconstructions of the cervical spine were also generated. RADIATION DOSE REDUCTION: This exam was performed according to the departmental dose-optimization program which includes automated exposure control, adjustment of the mA and/or kV according to patient size and/or use of iterative reconstruction technique. COMPARISON:  None Available. FINDINGS: CT HEAD FINDINGS Brain: No mass,hemorrhage or extra-axial collection. Normal appearance of the parenchyma and CSF spaces. Vascular: No hyperdense vessel or unexpected vascular calcification. Skull: The visualized skull base, calvarium and extracranial soft tissues are normal. Sinuses/Orbits: Right frontal and ethmoid sinus opacification. Small amount of fluid in the right maxillary sinus. Mastoids are clear. Other: None. CT CERVICAL SPINE FINDINGS Alignment: Grade 1 anterolisthesis at C3-4 and C4-5 Skull base and vertebrae: C5-7 ACDF.  No acute fracture. Soft tissues and spinal canal: No prevertebral fluid or swelling. No visible canal hematoma. Disc  levels: No advanced spinal canal or neural foraminal stenosis. Upper chest: No pneumothorax, pulmonary nodule or pleural effusion. Other: Normal visualized paraspinal cervical soft tissues. IMPRESSION: 1. No acute intracranial abnormality. 2. No acute fracture or static subluxation of the cervical spine. 3. C5-7 ACDF without hardware complication. Electronically Signed   By: Franky Stanford M.D.   On: 10/05/2023 02:16   DG Ankle Complete Right Result Date: 10/04/2023 CLINICAL DATA:  Injury. Right ankle pain and swelling after a fall a few hours ago. EXAM: RIGHT ANKLE - COMPLETE 3+ VIEW COMPARISON:  Right foot 02/03/2009 FINDINGS: Comminuted fractures with mild impaction and minimal displacement involving the distal fibula. No intra articular involvement. Linear lucency suggested over the medial malleolus probably represents an additional nondisplaced fracture. No dislocation. Joint spaces are normal. Soft tissue swelling over the lateral ankle. IMPRESSION: Comminuted minimally displaced fractures of the distal fibula with overlying soft tissue swelling. Linear lucency over the medial malleolus  likely represents an additional nondisplaced fracture. Electronically Signed   By: Elsie Gravely M.D.   On: 10/04/2023 22:20    Pertinent labs & imaging results that were available during my care of the patient were reviewed by me and considered in my medical decision making (see MDM for details).  Medications Ordered in ED Medications  HYDROcodone -acetaminophen  (NORCO/VICODIN) 5-325 MG per tablet 1 tablet (1 tablet Oral Given 10/05/23 0223)                                                                                                                                     Procedures Procedures  (including critical care time)  Medical Decision Making / ED Course    Medical Decision Making:    NAKHIA LEVITAN is a 73 y.o. female with past medical history as below, significant for fibromyalgia, chronic neck  pain, IBS, migraines, osteoporosis, 3 L O2 at all times who presents to the ED with complaint of fall, head injury, ankle injury. The complaint involves an extensive differential diagnosis and also carries with it a high risk of complications and morbidity.  Serious etiology was considered. Ddx includes but is not limited to: Differential diagnoses for head trauma includes subdural hematoma, epidural hematoma, acute concussion, traumatic subarachnoid hemorrhage, cerebral contusions, orthostatic, vasovagal, neurally mediated, ACS, arrhythmia, . etc.   Complete initial physical exam performed, notably the patient was in no distress, reporting pain to ankle/headache.    Reviewed and confirmed nursing documentation for past medical history, family history, social history.  Vital signs reviewed.     Clinical Course as of 10/05/23 0457  Tue Oct 05, 2023  0125 Ankle xr  IMPRESSION: Comminuted minimally displaced fractures of the distal fibula with overlying soft tissue swelling. Linear lucency over the medial malleolus likely represents an additional nondisplaced fracture.  Concerning for bimal fx [SG]    Clinical Course User Index [SG] Elnor Jayson LABOR, DO    Brief summary: 73 year old female history as above here with fall, ankle and head injury  Neuroexam is nonfocal.  She denies LOC.  No thinners.  She has headache, hematoma noted to occiput, midline C-spine pain.  Will obtain CT head and C-spine.  X-ray of ankle obtained in triage concerning for possible bimalleolar fracture.  NVI.  Will get knee x-ray to rule out proximal fibular fracture given she has some pain to her knee.  Placed in splint.  Will get screening labs and EKG given near-syncope   She previously was followed by Dr. Bensimhon for nonischemic cardiomyopathy following breast cancer with reduced EF as low as 34%.  Has since improved.  With echocardiogram 4/24 with LVEF 60 - 65% and G1 DD.  She had cart monitoring the past with  frequent runs of SVT and isolated PACs and bigeminy.  She was started back on diltiazem  was referred to EP for her frequent SVT; is not seen EP yet.  Has been  compliant with her diltiazem  since seen by cardiology around 6 months ago.  She has not had any palpitations the past 24 hours.  Respiratory status unchanged from baseline history of COPD  On recheck she is feeling much better.  Headache has resolved.  Ankle pain is improved.  Labs and imaging otherwise are stable other than fracture demonstrated on ankle x-ray.    Patient presents with syncopal symptoms without worrisome features. Presentation most suggestive of neuro-cardiogenic or orthostatic cause. Very low suspicion for serious arrhythmia, cardiac ischemia or other serious etiology. ECG reviewed, no evidence of a cardiac arrhythmia such as Brugada, WPW, HOCM, IHSS, Long or short QT. Neurologic exam is nonfocal, not consistent with CVA or primary neurologic abnormality. Patient appears safe for discharge with outpatient observation and close PCP F/U. Syncope warnings discussed with patient.  Concussion precautions provided.  The patient improved significantly and was discharged in stable condition. Detailed discussions were had with the patient/guardian regarding current findings, and need for close f/u with PCP or on call doctor, ortho. The patient/guardian has been instructed to return immediately if the symptoms worsen in any way for re-evaluation. Patient/guardian verbalized understanding and is in agreement with current care plan. All questions answered prior to discharge.           Additional history obtained: -Additional history obtained from family -External records from outside source obtained and reviewed including: Chart review including previous notes, labs, imaging, consultation notes including  Primary care documentation, home medications Cardiology documentation   Lab Tests: -I ordered, reviewed, and interpreted  labs.   The pertinent results include:   Labs Reviewed  CBC WITH DIFFERENTIAL/PLATELET - Abnormal; Notable for the following components:      Result Value   RBC 3.28 (*)    Hemoglobin 10.0 (*)    HCT 30.9 (*)    All other components within normal limits  BASIC METABOLIC PANEL - Abnormal; Notable for the following components:   Glucose, Bld 105 (*)    BUN 24 (*)    All other components within normal limits  TROPONIN I (HIGH SENSITIVITY)    Notable for labs stable  EKG   EKG Interpretation Date/Time:    Ventricular Rate:    PR Interval:    QRS Duration:    QT Interval:    QTC Calculation:   R Axis:      Text Interpretation:           Imaging Studies ordered: I ordered imaging studies including CTH CT c/s ,ankle xr I independently visualized the following imaging with scope of interpretation limited to determining acute life threatening conditions related to emergency care; findings noted above I independently visualized and interpreted imaging. I agree with the radiologist interpretation   Medicines ordered and prescription drug management: Meds ordered this encounter  Medications   HYDROcodone -acetaminophen  (NORCO/VICODIN) 5-325 MG per tablet 1 tablet    Refill:  0    -I have reviewed the patients home medicines and have made adjustments as needed   Consultations Obtained: na  Cardiac Monitoring: The patient was maintained on a cardiac monitor.  I personally viewed and interpreted the cardiac monitored which showed an underlying rhythm of: NSR Continuous pulse oximetry interpreted by myself, 98% on 3L.    Social Determinants of Health:  Diagnosis or treatment significantly limited by social determinants of health: former smoker   Reevaluation: After the interventions noted above, I reevaluated the patient and found that they have improved  Co morbidities that complicate the patient  evaluation  Past Medical History:  Diagnosis Date   Anemia    B12  deficient   B12 deficiency    Breast cancer (HCC)    left breast.   Chronic SI joint pain    Bilateral, Dr. Dolphus   Fibromyalgia    Dr. Dolphus   GERD (gastroesophageal reflux disease)    Greater trochanteric bursitis of both hips    Dr. Dolphus   Hyperlipidemia    IBS (irritable bowel syndrome)    LV dysfunction    iatrogenic from chemotherapy ; on Carvedilol    Migraines    Osteoporosis    Dr Lomax---> transferring to a new gyn   Oxygen  dependent    3 liters      Dispostion: Disposition decision including need for hospitalization was considered, and patient discharged from emergency department.    Final Clinical Impression(s) / ED Diagnoses Final diagnoses:  Closed bimalleolar fracture of right ankle, initial encounter  Injury of head, initial encounter  Fall, initial encounter  Near syncope        Elnor Jayson LABOR, DO 10/05/23 0454    Elnor Jayson LABOR, DO 10/05/23 [phone]

## 2023-10-07 ENCOUNTER — Telehealth: Payer: Self-pay | Admitting: *Deleted

## 2023-10-07 ENCOUNTER — Encounter: Payer: Self-pay | Admitting: Family Medicine

## 2023-10-07 ENCOUNTER — Ambulatory Visit (INDEPENDENT_AMBULATORY_CARE_PROVIDER_SITE_OTHER): Payer: Medicare Other | Admitting: Student

## 2023-10-07 ENCOUNTER — Encounter (HOSPITAL_BASED_OUTPATIENT_CLINIC_OR_DEPARTMENT_OTHER): Payer: Self-pay | Admitting: Student

## 2023-10-07 DIAGNOSIS — S82841A Displaced bimalleolar fracture of right lower leg, initial encounter for closed fracture: Secondary | ICD-10-CM

## 2023-10-07 NOTE — Telephone Encounter (Signed)
   Pre-operative Risk Assessment    Patient Name: [name]  DOB: October 22, 1950 MRN: [id number]   Date of last office visit: 06/07/23 DR. HOCHREIN Date of next office visit: NONE   Request for Surgical Clearance    Procedure:   RIGHT OPEN REDUCTION INTERNAL FIXATION ANKLE BI MALLEOLAR  ANKLE Fx  Date of Surgery:  Clearance 10/11/23  OR 10/12/23                              Surgeon:  DR. ELSPETH PARKER Surgeon's Group or Practice Name:  [facility]  Phone number:  [phone] Fax number:  [phone] ATTN: APRIL   Type of Clearance Requested:   - Medical ; NONE INDICATED TO BE HELD   Type of Anesthesia:  General    Additional requests/questions:    [name]   10/07/2023, 5:51 PM

## 2023-10-07 NOTE — Progress Notes (Signed)
 Chief Complaint: Right ankle fracture     History of Present Illness:   Discussed the use of AI scribe software for clinical note transcription with the patient, who gave verbal consent to proceed.  [name] is a 73 year old female with osteoporosis who presents with a bimalleolar ankle fracture after a fall. She is accompanied by her daughter, [name].  She sustained a bimalleolar ankle fracture involving both the medial and lateral malleolus, with mild displacement of the fibula, after a fall. She experiences pain and swelling in the ankle, with subsequent bruising. No numbness or tingling in the foot. She uses a walker with a seat for mobility since the injury.  The fall occurred on Monday evening around 7:30 PM while she was standing and texting. She felt hot and decided to sit down but fell flat on her back, resulting in a knot on the back of her head and the ankle injury. She was taken to the emergency department for evaluation.  She was diagnosed with a concussion in the emergency department, with initial symptoms including a knot on the back of the head. The knot has since resolved, but she reports persistent soreness.  Earlier on the day of the fall, she had a tooth extraction and an implant placed which made her feel unwell. She was on oxycodone  for pain management.  Her past medical history includes COPD, for which she uses oxygen , and osteoporosis. She has a history of gastroenteritis, which affects her ability to take aspirin .     Surgical History:   None  PMH/PSH/Family History/Social History/Meds/Allergies:    Past Medical History:  Diagnosis Date   Anemia    B12 deficient   B12 deficiency    Breast cancer (HCC)    left breast.   Chronic SI joint pain    Bilateral, Dr. Dolphus   Fibromyalgia    Dr. Dolphus   GERD (gastroesophageal reflux disease)    Greater trochanteric bursitis of both hips    Dr. Dolphus    Hyperlipidemia    IBS (irritable bowel syndrome)    LV dysfunction    iatrogenic from chemotherapy ; on Carvedilol    Migraines    Osteoporosis    Dr Lomax---> transferring to a new gyn   Oxygen  dependent    3 liters   Past Surgical History:  Procedure Laterality Date   ABDOMINAL HYSTERECTOMY     BSO for  Endometriosis   CARPAL TUNNEL RELEASE     left   CERVICAL DISC SURGERY     COLONOSCOPY W/ POLYPECTOMY  1999   Dr Luis   ESOPHAGEAL DILATION  2005   Dr Luis   MASTECTOMY  2009   bilateral, Dr Merrilyn REPINE     Social History   Socioeconomic History   Marital status: Widowed    Spouse name: Not on file   Number of children: 1   Years of education: Not on file   Highest education level: 12th grade  Occupational History   Occupation: retired, warehouse manager   Tobacco Use   Smoking status: Former    Current packs/day: 0.00    Average packs/day: 1 pack/day for 30.0 years (30.0 ttl pk-yrs)    Types: Cigarettes    Start date: 08/31/1960    Quit date: 08/31/1990    Years  since quitting: 33.1    Passive exposure: Never   Smokeless tobacco: Never  Vaping Use   Vaping status: Never Used  Substance and Sexual Activity   Alcohol use: No   Drug use: Not Currently    Types: Marijuana    Comment: 08/01/2017 last used    Sexual activity: Not Currently  Other Topics Concern   Not on file  Social History Narrative   Lives w/ husband   Social Drivers of Health   Financial Resource Strain: Low Risk  (05/11/2023)   Overall Financial Resource Strain (CARDIA)    Difficulty of Paying Living Expenses: Not hard at all  Food Insecurity: No Food Insecurity (05/11/2023)   Hunger Vital Sign    Worried About Running Out of Food in the Last Year: Never true    Ran Out of Food in the Last Year: Never true  Transportation Needs: No Transportation Needs (05/11/2023)   PRAPARE - Administrator, Civil Service (Medical): No    Lack of Transportation (Non-Medical): No  Physical  Activity: Inactive (05/11/2023)   Exercise Vital Sign    Days of Exercise per Week: 0 days    Minutes of Exercise per Session: 0 min  Stress: No Stress Concern Present (05/11/2023)   [employer] of Occupational Health - Occupational Stress Questionnaire    Feeling of Stress : Not at all  Social Connections: Socially Isolated (05/11/2023)   Social Connection and Isolation Panel [NHANES]    Frequency of Communication with Friends and Family: More than three times a week    Frequency of Social Gatherings with Friends and Family: More than three times a week    Attends Religious Services: Never    Database Administrator or Organizations: No    Attends Banker Meetings: Never    Marital Status: Widowed   Family History  Problem Relation Age of Onset   Diabetes Mother    Breast cancer Mother    Depression Mother        anxiety   Heart disease Mother        in her 46s   Colon cancer Sister    Healthy Daughter    Stroke Neg Hx    Allergies  Allergen Reactions   Amoxicillin-Pot Clavulanate     diarrhea   Doxycycline     Nausea & vomiting   Venlafaxine     ? Reaction; ? Blurred vision   Current Outpatient Medications  Medication Sig Dispense Refill   albuterol  (VENTOLIN  HFA) 108 (90 Base) MCG/ACT inhaler Inhale 2 puffs every 6 hours as needed 8 g 12   Calcium  Carbonate (CALTRATE 600 PO) Take by mouth.     citalopram  (CELEXA ) 40 MG tablet Take 40 mg by mouth daily.     diltiazem  (CARDIZEM  CD) 120 MG 24 hr capsule Take 1 capsule (120 mg total) by mouth daily. 90 capsule 3   hydrOXYzine  (ATARAX /VISTARIL ) 10 MG tablet Take 10 mg by mouth every 6 (six) hours as needed for anxiety.     Magnesium  400 MG CAPS Take by mouth.     methocarbamol  (ROBAXIN ) 750 MG tablet TAKE 1 TABLET BY MOUTH 2 TIMES DAILY AS NEEDED FOR MUSCLE SPASMS (NON FORMULARY) 60 tablet 2   Multiple Vitamins-Minerals (MULTIVITAMIN WITH MINERALS) tablet Take 1 tablet by mouth daily.     umeclidinium  bromide (INCRUSE ELLIPTA ) 62.5 MCG/ACT AEPB Inhale 1 puff into the lungs daily. 30 each 12   No current facility-administered medications for this  visit.   No results found.  Review of Systems:   A ROS was performed including pertinent positives and negatives as documented in the HPI.  Physical Exam :   Constitutional: NAD and appears stated age Neurological: Alert and oriented Psych: Appropriate affect and cooperative There were no vitals taken for this visit.   Comprehensive Musculoskeletal Exam:    Physical Exam   EXTREMITIES: Bruising and swelling on ankle. Capillary refill time approximately five seconds on toes. NEUROLOGICAL: Sensation to light touch intact on toes. Good movement of toes.      Imaging:   Xray review from ED on 10/03/22 (right ankle 3 views): Bimalleolar ankle fracture involving nondisplaced transverse fracture of the medial malleolus and comminuted distal fibula fracture with mild lateral displacement.  Lateral soft tissue edema.   I personally reviewed and interpreted the radiographs.   Assessment:    Bimalleolar Ankle Fracture   Following a fall on 10/04/2023, there is pain and swelling with X-ray confirmation of fractures on both sides of the ankle and mild displacement of the fibula fracture. Conservative management and surgical intervention were discussed, highlighting the risks and benefits of each. Plan for surgical intervention with Dr. Genelle early next week. Obtain clearances from pulmonology and cardiology. Pre-operative medications discussed and will be sent to pharmacy by Dr. Genelle. A boot will be provided to bring to surgery.  Post-operative Rehabilitation   Inpatient rehabilitation post-surgery may be necessary due to the inability to be self-supporting immediately after the operation. Will consider overnight stay in the hospital with possible discharge to SNF versus home.    Plan :    - Plan for right ankle ORIF with Dr. Genelle     I  personally saw and evaluated the patient, and participated in the management and treatment plan.  Leonce Reveal, PA-C Orthopedics

## 2023-10-08 ENCOUNTER — Telehealth: Payer: Self-pay | Admitting: *Deleted

## 2023-10-08 ENCOUNTER — Ambulatory Visit: Payer: Medicare Other | Admitting: Family Medicine

## 2023-10-08 ENCOUNTER — Telehealth: Payer: Self-pay | Admitting: Internal Medicine

## 2023-10-08 NOTE — Telephone Encounter (Signed)
 Per preop APP Rosabel Mose, NP to please add on Monday 10/11/23. Med rec and consent are done.     Patient Consent for Virtual Visit        [name] has provided verbal consent on 10/08/2023 for a virtual visit (video or telephone).   CONSENT FOR VIRTUAL VISIT FOR:  [name]  By participating in this virtual visit I agree to the following:  I hereby voluntarily request, consent and authorize [facility] and its employed or contracted physicians, physician assistants, nurse practitioners or other licensed health care professionals (the Practitioner), to provide me with telemedicine health care services (the "Services) as deemed necessary by the treating Practitioner. I acknowledge and consent to receive the Services by the Practitioner via telemedicine. I understand that the telemedicine visit will involve communicating with the Practitioner through live audiovisual communication technology and the disclosure of certain medical information by electronic transmission. I acknowledge that I have been given the opportunity to request an in-person assessment or other available alternative prior to the telemedicine visit and am voluntarily participating in the telemedicine visit.  I understand that I have the right to withhold or withdraw my consent to the use of telemedicine in the course of my care at any time, without affecting my right to future care or treatment, and that the Practitioner or I may terminate the telemedicine visit at any time. I understand that I have the right to inspect all information obtained and/or recorded in the course of the telemedicine visit and may receive copies of available information for a reasonable fee.  I understand that some of the potential risks of receiving the Services via telemedicine include:  Delay or interruption in medical evaluation due to technological equipment failure or disruption; Information transmitted may not be sufficient  (e.g. poor resolution of images) to allow for appropriate medical decision making by the Practitioner; and/or  In rare instances, security protocols could fail, causing a breach of personal health information.  Furthermore, I acknowledge that it is my responsibility to provide information about my medical history, conditions and care that is complete and accurate to the best of my ability. I acknowledge that Practitioner's advice, recommendations, and/or decision may be based on factors not within their control, such as incomplete or inaccurate data provided by me or distortions of diagnostic images or specimens that may result from electronic transmissions. I understand that the practice of medicine is not an exact science and that Practitioner makes no warranties or guarantees regarding treatment outcomes. I acknowledge that a copy of this consent can be made available to me via my patient portal [facility] MyChart), or I can request a printed copy by calling the office of [facility].    I understand that my insurance will be billed for this visit.   I have read or had this consent read to me. I understand the contents of this consent, which adequately explains the benefits and risks of the Services being provided via telemedicine.  I have been provided ample opportunity to ask questions regarding this consent and the Services and have had my questions answered to my satisfaction. I give my informed consent for the services to be provided through the use of telemedicine in my medical care

## 2023-10-08 NOTE — Telephone Encounter (Signed)
 Fax received from Dr. Elspeth Parker with [facility] to perform a open reduction internal fixation right bimalleolar ankle fracture on patient under general anesthesia  Patient needs surgery clearance. Surgery is pending clearance. Patient was seen on 06/21/23. Office protocol is a risk assessment can be sent to surgeon if patient has been seen in 60 days or less.   Sending to Dr Neysa for risk assessment or recommendations if patient needs to be seen in office prior to surgical procedure.   Pt has ov with you on 12/20/23- can you clear now or wait until this appt?

## 2023-10-08 NOTE — Telephone Encounter (Signed)
 Clearance note added as addendum to my 06/21/23 office note.

## 2023-10-08 NOTE — Telephone Encounter (Signed)
  Per preop APP Koren Persons, NP to please add on Monday 10/11/23. Med rec and consent are done.

## 2023-10-08 NOTE — Telephone Encounter (Signed)
 Left message for the pt to call back as we will need to add her on today for tele preop appt per preop APP Katlyn West, NP. Pt will need to be added to 3:40 today.   I will send a message as FYI to Dr. Hermina Loosen.

## 2023-10-08 NOTE — Telephone Encounter (Signed)
   Name: [name]  DOB: 18-Apr-1951  MRN: [id number]  Primary Cardiologist: Lynwood Schilling, MD   Preoperative team, please contact this patient and set up a phone call appointment for further preoperative risk assessment. Please obtain consent and complete medication review. Thank you for your help.  I confirm that guidance regarding antiplatelet and oral anticoagulation therapy has been completed and, if necessary, noted below.  None requested  I also confirmed the patient resides in the state of [place] . As per [facility] telemedicine laws, the patient must reside in the state in which the provider is licensed.   [name], NP 10/08/2023, 7:39 AM  [facility]

## 2023-10-11 ENCOUNTER — Encounter (HOSPITAL_COMMUNITY): Payer: Self-pay | Admitting: Orthopaedic Surgery

## 2023-10-11 ENCOUNTER — Other Ambulatory Visit: Payer: Self-pay

## 2023-10-11 ENCOUNTER — Telehealth: Payer: Self-pay | Admitting: Orthopaedic Surgery

## 2023-10-11 ENCOUNTER — Telehealth (HOSPITAL_BASED_OUTPATIENT_CLINIC_OR_DEPARTMENT_OTHER): Payer: Self-pay | Admitting: Orthopaedic Surgery

## 2023-10-11 ENCOUNTER — Ambulatory Visit: Payer: Medicare Other | Attending: Nurse Practitioner | Admitting: Nurse Practitioner

## 2023-10-11 ENCOUNTER — Encounter: Payer: Self-pay | Admitting: Nurse Practitioner

## 2023-10-11 DIAGNOSIS — Z01810 Encounter for preprocedural cardiovascular examination: Secondary | ICD-10-CM | POA: Diagnosis not present

## 2023-10-11 NOTE — Progress Notes (Signed)
 Spoke to patient and she is on 3 liters Nasal Cannula continously at home. Per our guidelines, patient will have to be moved to main. April at Dr. Verline Glow office aware.

## 2023-10-11 NOTE — Progress Notes (Addendum)
 PCP -  Noreene Bearded, PA  Cardiologist - Eilleen Grates, MD   PPM/ICD - denies Device Orders - n/a Rep Notified - n/a  Chest x-ray - 12-23-22 EKG - 10-05-23 Stress Test -  ECHO - 12-23-22 Cardiac Cath -   CPAP - denies  GLP-1 - denies  DM - denies  Blood Thinner Instructions: denies Aspirin  Instructions: n/a  ERAS Protcol - clear liquids until 6:15  COVID TEST- no  Anesthesia review: Hx of GERD,  Breast ca, 02 dependent  Patient verbally denies any shortness of breath, fever, cough and chest pain during phone call   -------------  SDW INSTRUCTIONS given:  Your procedure is scheduled on October 12, 2023.  Report to [facility] Main Entrance "A" at 6:45 A.M., and check in at the Admitting office.  Call this number if you have problems the morning of surgery:  [phone]   Remember:  Do not eat after midnight the night before your surgery  You may drink clear liquids until 6:15 the morning of your surgery.   Clear liquids allowed are: Water, Non-Citrus Juices (without pulp), Carbonated Beverages, Clear Tea, Black Coffee Only, and Gatorade    Take these medicines the morning of surgery with A SIP OF WATER  citalopram  (CELEXA )  diltiazem  (CARDIZEM  CD)  umeclidinium bromide  (INCRUSE ELLIPTA )   IF NEEDED albuterol  (VENTOLIN  HFA) inhaler  hydrOXYzine  (ATARAX /VISTARIL )  methocarbamol  (ROBAXIN )   As of today, STOP taking any Aspirin  (unless otherwise instructed by your surgeon) Aleve, Naproxen, Ibuprofen, Motrin, Advil, Goody's, BC's, all herbal medications, fish oil, and all vitamins.                      Do not wear jewelry, make up, or nail polish            Do not wear lotions, powders, perfumes/colognes, or deodorant.            Do not shave 48 hours prior to surgery.  Men may shave face and neck.            Do not bring valuables to the hospital.            [facility] is not responsible for any belongings or valuables.  Do NOT Smoke (Tobacco/Vaping) 24  hours prior to your procedure If you use a CPAP at night, you may bring all equipment for your overnight stay.   Contacts, glasses, dentures or bridgework may not be worn into surgery.      For patients admitted to the hospital, discharge time will be determined by your treatment team.   Patients discharged the day of surgery will not be allowed to drive home, and someone needs to stay with them for 24 hours.    Special instructions:   [place]- Preparing For Surgery  Before surgery, you can play an important role. Because skin is not sterile, your skin needs to be as free of germs as possible. You can reduce the number of germs on your skin by washing with CHG (chlorahexidine gluconate) Soap before surgery.  CHG is an antiseptic cleaner which kills germs and bonds with the skin to continue killing germs even after washing.    Oral Hygiene is also important to reduce your risk of infection.  Remember - BRUSH YOUR TEETH THE MORNING OF SURGERY WITH YOUR REGULAR TOOTHPASTE  Please do not use if you have an allergy to CHG or antibacterial soaps. If your skin becomes reddened/irritated stop using the CHG.  Do not shave (including legs and underarms) for at least 48 hours prior to first CHG shower. It is OK to shave your face.  Please follow these instructions carefully.   Shower the NIGHT BEFORE SURGERY and the MORNING OF SURGERY with DIAL Soap.   Pat yourself dry with a CLEAN TOWEL.  Wear CLEAN PAJAMAS to bed the night before surgery  Place CLEAN SHEETS on your bed the night of your first shower and DO NOT SLEEP WITH PETS.   Day of Surgery: Please shower morning of surgery  Wear Clean/Comfortable clothing the morning of surgery Do not apply any deodorants/lotions.   Remember to brush your teeth WITH YOUR REGULAR TOOTHPASTE.   Questions were answered. Patient verbalized understanding of instructions.

## 2023-10-11 NOTE — Telephone Encounter (Signed)
 Patient lives alone and daughter is concerned about mother after surgery because daughter cannot provide 24 hour assistance for her. Daughter was asking if mother could be sent to rehab post surgery? Please call to discuss.

## 2023-10-11 NOTE — Progress Notes (Signed)
 Virtual Visit via Telephone Note   Because of [name]'s co-morbid illnesses, she is at least at moderate risk for complications without adequate follow up.  This format is felt to be most appropriate for this patient at this time.  The patient did not have access to video technology/had technical difficulties with video requiring transitioning to audio format only (telephone).  All issues noted in this document were discussed and addressed.  No physical exam could be performed with this format.  Please refer to the patient's chart for her consent to telehealth for [name].  Evaluation Performed:  Preoperative cardiovascular risk assessment _____________   Date:  10/11/2023   Patient ID:  [name], DOB 10-Aug-1951, MRN [id number] Patient Location:  Home Provider location:   Office  Primary Care Provider:  Noreene Bearded, PA Primary Cardiologist:  Eilleen Grates, MD  Chief Complaint / Patient Profile   73 y.o. y/o female with a h/o NICM, chronic HFrEF, COPD on chronic oxygen , mitral regurgitaiton, SVT who is pending right open reduction internal fixation ankle fracture with Dr. Hermina Loosen and presents today for telephonic preoperative cardiovascular risk assessment.  History of Present Illness    [name] is a 73 y.o. female who presents via audio/video conferencing for a telehealth visit today.  Pt was last seen in cardiology clinic on 06/07/2023 by Dr. Lavonne Prairie.  At that time [name] was doing well.  The patient is now pending procedure as outlined above. Since her last visit, she denies chest pain, lower extremity edema, fatigue, palpitations, presyncope, syncope, orthopnea, and PND. She reports chronic shortness of breath that she feels is stable. She is limited by COPD and chronic O2 use, but is able to achieve > 4 METS activity with moderate house work and ADLs.    Past Medical History    Past Medical History:  Diagnosis Date    Anemia    B12 deficient   B12 deficiency    Breast cancer (HCC)    left breast.   Chronic SI joint pain    Bilateral, Dr. Alvira Josephs   Fibromyalgia    Dr. Alvira Josephs   GERD (gastroesophageal reflux disease)    Greater trochanteric bursitis of both hips    Dr. Alvira Josephs   Hyperlipidemia    IBS (irritable bowel syndrome)    LV dysfunction    iatrogenic from chemotherapy ; on Carvedilol    Migraines    Osteoporosis    Dr Lomax---> transferring to a new gyn   Oxygen  dependent    3 liters   Past Surgical History:  Procedure Laterality Date   ABDOMINAL HYSTERECTOMY     BSO for  Endometriosis   CARPAL TUNNEL RELEASE     left   CERVICAL DISC SURGERY     COLONOSCOPY W/ POLYPECTOMY  1999   Dr Andriette Keeling   ESOPHAGEAL DILATION  2005   Dr Andriette Keeling   MASTECTOMY  2009   bilateral, Dr Linell Rhymes   SEPTOPLASTY      Allergies  Allergies  Allergen Reactions   Amoxicillin-Pot Clavulanate     diarrhea   Doxycycline     Nausea & vomiting   Venlafaxine     ? Reaction; ? Blurred vision    Home Medications    Prior to Admission medications   Medication Sig Start Date End Date Taking? Authorizing Provider  albuterol  (VENTOLIN  HFA) 108 (90 Base) MCG/ACT inhaler Inhale 2 puffs every 6 hours as needed 02/19/23   Rosa College D,  MD  Calcium  Carbonate (CALTRATE 600 PO) Take by mouth.    [provider]  citalopram  (CELEXA ) 40 MG tablet Take 40 mg by mouth daily.    Amematsro, Fred, NP  diltiazem  (CARDIZEM  CD) 120 MG 24 hr capsule Take 1 capsule (120 mg total) by mouth daily. 02/12/23   Dunn, Dayna N, PA-C  hydrOXYzine  (ATARAX /VISTARIL ) 10 MG tablet Take 10 mg by mouth every 6 (six) hours as needed for anxiety. 05/31/20   [provider]  Magnesium  400 MG CAPS Take by mouth.    [provider]  methocarbamol  (ROBAXIN ) 750 MG tablet TAKE 1 TABLET BY MOUTH 2 TIMES DAILY AS NEEDED FOR MUSCLE SPASMS (NON FORMULARY) 10/04/23   Nicholas Bari, MD  Multiple Vitamins-Minerals  (MULTIVITAMIN WITH MINERALS) tablet Take 1 tablet by mouth daily.    [provider]  umeclidinium bromide  (INCRUSE ELLIPTA ) 62.5 MCG/ACT AEPB Inhale 1 puff into the lungs daily. 02/19/23   Faustina Hood, MD    Physical Exam    Vital Signs:  [name] does not have vital signs available for review today.  Given telephonic nature of communication, physical exam is limited. AAOx3. NAD. Normal affect.  Speech and respirations are unlabored.  Accessory Clinical Findings    None  Assessment & Plan    1.  Preoperative Cardiovascular Risk Assessment: According to the Revised Cardiac Risk Index (RCRI), her Perioperative Risk of Major Cardiac Event is (%): 0.9. Her Functional Capacity in METs is: 4.4 according to the [facility] Activity Status Index (DASI). The patient is doing well from a cardiac perspective. Therefore, based on ACC/AHA guidelines, the patient would be at acceptable risk for the planned procedure without further cardiovascular testing.  \ The patient was advised that if she develops new symptoms prior to surgery to contact our office to arrange for a follow-up visit, and she verbalized understanding.  No request to hold cardiac medications  A copy of this note will be routed to requesting surgeon.  Time:   Today, I have spent 10 minutes with the patient with telehealth technology discussing medical history, symptoms, and management plan.    Gerldine Koch, NP-C  10/11/2023, 10:45 AM [address] Office [phone] Fax [phone]

## 2023-10-11 NOTE — Telephone Encounter (Signed)
 Daughter has questions about her moms surgery. Her mom can not remember the best contact number  for [facility] [phone]

## 2023-10-12 ENCOUNTER — Observation Stay (HOSPITAL_COMMUNITY)
Admission: RE | Admit: 2023-10-12 | Discharge: 2023-10-15 | Disposition: A | Discharge status: Skilled Nursing Facility | Payer: Medicare Other | Attending: Orthopaedic Surgery | Admitting: Orthopaedic Surgery

## 2023-10-12 ENCOUNTER — Encounter (HOSPITAL_COMMUNITY)
Admission: RE | Disposition: A | Discharge status: Skilled Nursing Facility | Payer: Self-pay | Source: Home / Self Care | Attending: Orthopaedic Surgery

## 2023-10-12 ENCOUNTER — Ambulatory Visit (HOSPITAL_COMMUNITY): Payer: Medicare Other | Admitting: Anesthesiology

## 2023-10-12 ENCOUNTER — Other Ambulatory Visit: Payer: Self-pay

## 2023-10-12 ENCOUNTER — Ambulatory Visit (HOSPITAL_COMMUNITY): Payer: Medicare Other

## 2023-10-12 ENCOUNTER — Encounter (HOSPITAL_COMMUNITY): Payer: Self-pay | Admitting: Orthopaedic Surgery

## 2023-10-12 DIAGNOSIS — X58XXXA Exposure to other specified factors, initial encounter: Secondary | ICD-10-CM | POA: Insufficient documentation

## 2023-10-12 DIAGNOSIS — I10 Essential (primary) hypertension: Secondary | ICD-10-CM | POA: Diagnosis not present

## 2023-10-12 DIAGNOSIS — Z87891 Personal history of nicotine dependence: Secondary | ICD-10-CM | POA: Insufficient documentation

## 2023-10-12 DIAGNOSIS — S82401A Unspecified fracture of shaft of right fibula, initial encounter for closed fracture: Secondary | ICD-10-CM | POA: Diagnosis not present

## 2023-10-12 DIAGNOSIS — Z853 Personal history of malignant neoplasm of breast: Secondary | ICD-10-CM | POA: Diagnosis not present

## 2023-10-12 DIAGNOSIS — S82841A Displaced bimalleolar fracture of right lower leg, initial encounter for closed fracture: Secondary | ICD-10-CM

## 2023-10-12 DIAGNOSIS — J449 Chronic obstructive pulmonary disease, unspecified: Secondary | ICD-10-CM | POA: Diagnosis not present

## 2023-10-12 DIAGNOSIS — F418 Other specified anxiety disorders: Secondary | ICD-10-CM

## 2023-10-12 DIAGNOSIS — Z79899 Other long term (current) drug therapy: Secondary | ICD-10-CM | POA: Diagnosis not present

## 2023-10-12 DIAGNOSIS — Z01818 Encounter for other preprocedural examination: Principal | ICD-10-CM

## 2023-10-12 DIAGNOSIS — S8251XA Displaced fracture of medial malleolus of right tibia, initial encounter for closed fracture: Secondary | ICD-10-CM | POA: Diagnosis not present

## 2023-10-12 HISTORY — DX: Unspecified osteoarthritis, unspecified site: M19.90

## 2023-10-12 HISTORY — PX: ORIF ANKLE FRACTURE: SHX5408

## 2023-10-12 HISTORY — DX: Chronic obstructive pulmonary disease, unspecified: J44.9

## 2023-10-12 LAB — GLUCOSE, CAPILLARY: Glucose-Capillary: 157 mg/dL — ABNORMAL HIGH (ref 70–99)

## 2023-10-12 LAB — SURGICAL PCR SCREEN
MRSA, PCR: NEGATIVE
Staphylococcus aureus: NEGATIVE

## 2023-10-12 SURGERY — OPEN REDUCTION INTERNAL FIXATION (ORIF) ANKLE FRACTURE
Anesthesia: Monitor Anesthesia Care | Site: Ankle | Laterality: Right

## 2023-10-12 MED ORDER — CEFAZOLIN SODIUM-DEXTROSE 2-3 GM-%(50ML) IV SOLR
INTRAVENOUS | Status: DC | PRN
  Administered 2023-10-12: 2 g via INTRAVENOUS

## 2023-10-12 MED ORDER — LACTATED RINGERS IV SOLN: INTRAVENOUS | Status: DC

## 2023-10-12 MED ORDER — FENTANYL CITRATE (PF) 100 MCG/2ML IJ SOLN: 25.0000 ug | Freq: Once | INTRAMUSCULAR | Status: AC

## 2023-10-12 MED ORDER — MAGNESIUM OXIDE -MG SUPPLEMENT 400 (240 MG) MG PO TABS
400.0000 mg | ORAL_TABLET | Freq: Every day | ORAL | Status: DC
  Filled 2023-10-12 (×3): qty 1

## 2023-10-12 MED ORDER — PROPOFOL 10 MG/ML IV BOLUS
INTRAVENOUS | Status: AC
  Filled 2023-10-12: qty 20

## 2023-10-12 MED ORDER — OXYCODONE HCL 5 MG PO TABS: 5.0000 mg | ORAL_TABLET | Freq: Once | ORAL | Status: DC | PRN

## 2023-10-12 MED ORDER — ACETAMINOPHEN 325 MG PO TABS
325.0000 mg | ORAL_TABLET | Freq: Four times a day (QID) | ORAL | Status: DC | PRN
  Administered 2023-10-14 (×2): 650 mg via ORAL
  Filled 2023-10-12 (×2): qty 2

## 2023-10-12 MED ORDER — ACETAMINOPHEN 500 MG PO TABS
1000.0000 mg | ORAL_TABLET | Freq: Once | ORAL | Status: AC
  Administered 2023-10-12: 1000 mg via ORAL
  Filled 2023-10-12: qty 2

## 2023-10-12 MED ORDER — MIDAZOLAM HCL 2 MG/2ML IJ SOLN
INTRAMUSCULAR | Status: AC
  Administered 2023-10-12: 0.5 mg via INTRAVENOUS
  Filled 2023-10-12: qty 2

## 2023-10-12 MED ORDER — MEPERIDINE HCL 25 MG/ML IJ SOLN: 6.2500 mg | INTRAMUSCULAR | Status: DC | PRN

## 2023-10-12 MED ORDER — UMECLIDINIUM BROMIDE 62.5 MCG/ACT IN AEPB
1.0000 | INHALATION_SPRAY | Freq: Every day | RESPIRATORY_TRACT | Status: DC
  Administered 2023-10-13 – 2023-10-15 (×3): 1 via RESPIRATORY_TRACT
  Filled 2023-10-12: qty 7

## 2023-10-12 MED ORDER — OXYCODONE HCL 5 MG/5ML PO SOLN: 5.0000 mg | Freq: Once | ORAL | Status: DC | PRN

## 2023-10-12 MED ORDER — ALBUTEROL SULFATE (2.5 MG/3ML) 0.083% IN NEBU
2.5000 mg | INHALATION_SOLUTION | Freq: Four times a day (QID) | RESPIRATORY_TRACT | Status: DC | PRN
  Administered 2023-10-15: 2.5 mg via RESPIRATORY_TRACT
  Filled 2023-10-12: qty 3

## 2023-10-12 MED ORDER — HYDROMORPHONE HCL 1 MG/ML IJ SOLN: 0.5000 mg | INTRAMUSCULAR | Status: DC | PRN

## 2023-10-12 MED ORDER — OXYCODONE HCL 5 MG PO TABS
5.0000 mg | ORAL_TABLET | ORAL | Status: DC | PRN
  Administered 2023-10-13 – 2023-10-15 (×10): 10 mg via ORAL
  Filled 2023-10-12 (×13): qty 2

## 2023-10-12 MED ORDER — ONDANSETRON HCL 4 MG PO TABS
4.0000 mg | ORAL_TABLET | Freq: Four times a day (QID) | ORAL | Status: DC | PRN
  Administered 2023-10-13 – 2023-10-15 (×2): 4 mg via ORAL
  Filled 2023-10-12 (×2): qty 1

## 2023-10-12 MED ORDER — ORAL CARE MOUTH RINSE: 15.0000 mL | Freq: Once | OROMUCOSAL | Status: AC

## 2023-10-12 MED ORDER — MIDAZOLAM HCL 2 MG/2ML IJ SOLN: 0.5000 mg | Freq: Once | INTRAMUSCULAR | Status: AC

## 2023-10-12 MED ORDER — ADULT MULTIVITAMIN W/MINERALS CH
1.0000 | ORAL_TABLET | Freq: Every day | ORAL | Status: DC
  Administered 2023-10-12: 1 via ORAL
  Filled 2023-10-12 (×4): qty 1

## 2023-10-12 MED ORDER — DILTIAZEM HCL ER COATED BEADS 120 MG PO CP24
120.0000 mg | ORAL_CAPSULE | Freq: Every day | ORAL | Status: DC
  Administered 2023-10-13 – 2023-10-15 (×3): 120 mg via ORAL
  Filled 2023-10-12 (×3): qty 1

## 2023-10-12 MED ORDER — HYDROXYZINE HCL 10 MG PO TABS: 10.0000 mg | ORAL_TABLET | Freq: Four times a day (QID) | ORAL | Status: DC | PRN

## 2023-10-12 MED ORDER — BUPIVACAINE-EPINEPHRINE (PF) 0.5% -1:200000 IJ SOLN
INTRAMUSCULAR | Status: DC | PRN
  Administered 2023-10-12: 20 mL via PERINEURAL
  Administered 2023-10-12: 10 mL via PERINEURAL

## 2023-10-12 MED ORDER — OXYCODONE HCL 5 MG PO TABS
10.0000 mg | ORAL_TABLET | ORAL | Status: DC | PRN
  Administered 2023-10-13 – 2023-10-15 (×4): 10 mg via ORAL
  Filled 2023-10-12: qty 3

## 2023-10-12 MED ORDER — ONDANSETRON HCL 4 MG/2ML IJ SOLN
4.0000 mg | Freq: Four times a day (QID) | INTRAMUSCULAR | Status: DC | PRN
  Filled 2023-10-12 (×2): qty 2

## 2023-10-12 MED ORDER — DOCUSATE SODIUM 100 MG PO CAPS
100.0000 mg | ORAL_CAPSULE | Freq: Two times a day (BID) | ORAL | Status: DC
  Administered 2023-10-12 – 2023-10-15 (×7): 100 mg via ORAL
  Filled 2023-10-12 (×7): qty 1

## 2023-10-12 MED ORDER — MIDAZOLAM HCL 2 MG/2ML IJ SOLN: 0.5000 mg | Freq: Once | INTRAMUSCULAR | Status: DC | PRN

## 2023-10-12 MED ORDER — VANCOMYCIN HCL 1000 MG IV SOLR
INTRAVENOUS | Status: AC
  Filled 2023-10-12: qty 20

## 2023-10-12 MED ORDER — CHLORHEXIDINE GLUCONATE 0.12 % MT SOLN
15.0000 mL | Freq: Once | OROMUCOSAL | Status: AC
  Administered 2023-10-12: 15 mL via OROMUCOSAL
  Filled 2023-10-12: qty 15

## 2023-10-12 MED ORDER — ACETAMINOPHEN 500 MG PO TABS
1000.0000 mg | ORAL_TABLET | Freq: Four times a day (QID) | ORAL | Status: AC
  Administered 2023-10-12 – 2023-10-13 (×3): 1000 mg via ORAL
  Filled 2023-10-12 (×3): qty 2

## 2023-10-12 MED ORDER — ASPIRIN 325 MG PO TABS
325.0000 mg | ORAL_TABLET | Freq: Every day | ORAL | Status: DC
  Administered 2023-10-13 – 2023-10-15 (×3): 325 mg via ORAL
  Filled 2023-10-12 (×3): qty 1

## 2023-10-12 MED ORDER — HYDROMORPHONE HCL 1 MG/ML IJ SOLN: 0.2500 mg | INTRAMUSCULAR | Status: DC | PRN

## 2023-10-12 MED ORDER — ZINC SULFATE 220 (50 ZN) MG PO CAPS
220.0000 mg | ORAL_CAPSULE | Freq: Every day | ORAL | Status: DC
  Administered 2023-10-12 – 2023-10-14 (×3): 220 mg via ORAL
  Filled 2023-10-12 (×4): qty 1

## 2023-10-12 MED ORDER — PROPOFOL 500 MG/50ML IV EMUL
INTRAVENOUS | Status: DC | PRN
Start: 2023-10-12 — End: 2023-10-12
  Administered 2023-10-12: 50 ug/kg/min via INTRAVENOUS

## 2023-10-12 MED ORDER — FENTANYL CITRATE (PF) 100 MCG/2ML IJ SOLN
INTRAMUSCULAR | Status: AC
Start: 2023-10-12 — End: 2023-10-12
  Administered 2023-10-12: 25 ug via INTRAVENOUS
  Filled 2023-10-12: qty 2

## 2023-10-12 MED ORDER — 0.9 % SODIUM CHLORIDE (POUR BTL) OPTIME
TOPICAL | Status: DC | PRN
  Administered 2023-10-12: 1000 mL

## 2023-10-12 MED ORDER — CITALOPRAM HYDROBROMIDE 20 MG PO TABS
40.0000 mg | ORAL_TABLET | Freq: Every day | ORAL | Status: DC
  Administered 2023-10-13 – 2023-10-15 (×3): 40 mg via ORAL
  Filled 2023-10-12 (×3): qty 2

## 2023-10-12 MED ORDER — SODIUM CHLORIDE 0.9 % IV SOLN: INTRAVENOUS | Status: DC

## 2023-10-12 SURGICAL SUPPLY — 55 items
BAG COUNTER SPONGE SURGICOUNT (BAG) ×1 IMPLANT
BANDAGE ESMARK 6X9 LF (GAUZE/BANDAGES/DRESSINGS) IMPLANT
BIT DRILL 2.7 QC CANN 155 (BIT) IMPLANT
BIT DRILL QC 2.0 SHORT EVOS SM (DRILL) IMPLANT
BIT DRILL QC 2.5MM SHRT EVO SM (DRILL) IMPLANT
BNDG COHESIVE 4X5 TAN STRL (GAUZE/BANDAGES/DRESSINGS) ×1 IMPLANT
BNDG COHESIVE 6X5 TAN ST LF (GAUZE/BANDAGES/DRESSINGS) ×1 IMPLANT
BNDG ELASTIC 4X5.8 VLCR NS LF (GAUZE/BANDAGES/DRESSINGS) IMPLANT
BNDG ESMARK 6X9 LF (GAUZE/BANDAGES/DRESSINGS) IMPLANT
BNDG GAUZE DERMACEA FLUFF 4 (GAUZE/BANDAGES/DRESSINGS) ×1 IMPLANT
COVER SURGICAL LIGHT HANDLE (MISCELLANEOUS) ×1 IMPLANT
DRAPE C-ARM 42X120 X-RAY (DRAPES) ×1 IMPLANT
DRAPE C-ARMOR (DRAPES) IMPLANT
DRAPE OEC MINIVIEW 54X84 (DRAPES) IMPLANT
DRAPE U-SHAPE 47X51 STRL (DRAPES) ×1 IMPLANT
DRILL QC 2.0 SHORT EVOS SM (DRILL) ×1 IMPLANT
DRILL QC 2.5MM SHORT EVOS SM (DRILL) ×1 IMPLANT
DRSG ADAPTIC 3X8 NADH LF (GAUZE/BANDAGES/DRESSINGS) ×1 IMPLANT
DRSG XEROFORM 1X8 (GAUZE/BANDAGES/DRESSINGS) IMPLANT
DURAPREP 26ML APPLICATOR (WOUND CARE) ×1 IMPLANT
ELECT REM PT RETURN 9FT ADLT (ELECTROSURGICAL) ×1 IMPLANT
ELECTRODE REM PT RTRN 9FT ADLT (ELECTROSURGICAL) ×1 IMPLANT
GAUZE PAD ABD 8X10 STRL (GAUZE/BANDAGES/DRESSINGS) ×1 IMPLANT
GAUZE SPONGE 4X4 12PLY STRL (GAUZE/BANDAGES/DRESSINGS) ×1 IMPLANT
GLOVE BIOGEL PI IND STRL 6.5 (GLOVE) ×1 IMPLANT
GLOVE BIOGEL PI IND STRL 8 (GLOVE) ×1 IMPLANT
GLOVE ECLIPSE 6.0 STRL STRAW (GLOVE) ×1 IMPLANT
GLOVE INDICATOR 8.0 STRL GRN (GLOVE) ×1 IMPLANT
GOWN STRL REUS W/ TWL LRG LVL3 (GOWN DISPOSABLE) ×1 IMPLANT
GOWN STRL REUS W/ TWL XL LVL3 (GOWN DISPOSABLE) ×3 IMPLANT
GUIDE PIN 1.3 (PIN) ×2 IMPLANT
K-WIRE FX150X1.6XTROC PNT (WIRE) ×1 IMPLANT
KIT BASIN OR (CUSTOM PROCEDURE TRAY) ×1 IMPLANT
KIT TURNOVER KIT B (KITS) ×1 IMPLANT
KWIRE FX150X1.6XTROC PNT (WIRE) IMPLANT
MANIFOLD NEPTUNE II (INSTRUMENTS) ×1 IMPLANT
NS IRRIG 1000ML POUR BTL (IV SOLUTION) ×1 IMPLANT
PACK ORTHO EXTREMITY (CUSTOM PROCEDURE TRAY) ×1 IMPLANT
PAD ABD 8X10 STRL (GAUZE/BANDAGES/DRESSINGS) IMPLANT
PAD ARMBOARD 7.5X6 YLW CONV (MISCELLANEOUS) ×2 IMPLANT
PADDING CAST ABS COTTON 4X4 ST (CAST SUPPLIES) IMPLANT
PIN GUIDE 1.3 (PIN) IMPLANT
PLATE FIB EVOS 5H R 2.7X93 (Plate) IMPLANT
SCREW BONE 4.0X12 CANC (Screw) IMPLANT
SCREW CANN 4.0X14 (Screw) IMPLANT
SCREW CANNULATED 4.0X60 (Screw) IMPLANT
SCREW CORT 2.7X16 ST EVOS (Screw) IMPLANT
SCREW CORT 2.7X16 STAR T8 EVOS (Screw) IMPLANT
SCREW CORT EVOS ST 3.5X12 (Screw) IMPLANT
SUCTION TUBE FRAZIER 10FR DISP (SUCTIONS) ×1 IMPLANT
SUT ETHILON 3 0 FSL (SUTURE) ×2 IMPLANT
SUT VIC AB 2-0 CT1 TAPERPNT 27 (SUTURE) ×1 IMPLANT
TOWEL GREEN STERILE (TOWEL DISPOSABLE) ×1 IMPLANT
TOWEL GREEN STERILE FF (TOWEL DISPOSABLE) ×1 IMPLANT
TUBE CONNECTING 12X1/4 (SUCTIONS) ×1 IMPLANT

## 2023-10-12 NOTE — Brief Op Note (Signed)
   Brief Op Note  Date of Surgery: 10/12/2023  Preoperative Diagnosis: RIGHT BIMALLEOLAR ANKLE FRACTURE  Postoperative Diagnosis: same  Procedure: Procedure(s): RIGHT OPEN REDUCTION INTERNAL FIXATION (ORIF) ANKLE FRACTURE  Implants: Implant Name Type Inv. Item Serial No. Manufacturer Lot No. LRB No. Used Action  SCREW CORT 3.5X17 ST EVOS - [id number] Screw SCREW CORT 3.5X17 ST EVOS  [employer]  Right 4 Implanted  PLATE FIB EVOS 5H R 2.7X93 - [id number] Plate PLATE FIB EVOS 5H R 2.7X93  [employer]  Right 1 Implanted  SCREW CORT 2.7X16 ST EVOS - [id number] Screw SCREW CORT 2.7X16 ST EVOS  [employer]  Right 1 Implanted  SCREW CANN 4.0X14 - [id number] Screw SCREW CANN 4.0X14  [employer]  Right 1 Implanted  SCREW BONE 4.0X12 CANC - [id number] Screw SCREW BONE 4.0X12 CANC  [employer]  Right 1 Implanted  SCREW CORT 2.7X16 STAR T8 EVOS - [id number] Screw SCREW CORT 2.7X16 STAR T8 EVOS  [employer]  Right 2 Implanted  SCREW CANNULATED 4.0X60 - [id number] Screw SCREW CANNULATED 4.0X60  [employer]  Right 2 Implanted    Surgeons: Surgeon(s): Huel Cote, MD  Anesthesia: Choice    Estimated Blood Loss: See anesthesia record  Complications: None  Condition to PACU: Stable  Benancio Deeds, MD 10/12/2023 10:41 AM

## 2023-10-12 NOTE — Anesthesia Procedure Notes (Signed)
Anesthesia Regional Block: Popliteal block   Pre-Anesthetic Checklist: , timeout performed,  Correct Patient, Correct Site, Correct Laterality,  Correct Procedure,, site marked,  Risks and benefits discussed,  Surgical consent,  Pre-op evaluation,  At surgeon's request and post-op pain management  Laterality: Right and Lower  Prep: chloraprep       Needles:  Injection technique: Single-shot  Needle Type: Echogenic Needle     Needle Length: 9cm  Needle Gauge: 21     Additional Needles:   Procedures:,,,, ultrasound used (permanent image in chart),,     Nerve Stimulator or Paresthesia:  Response: 0.45 mA Response: 0.45 mA  Additional Responses:   Narrative:  Start time: 10/12/2023 9:06 AM End time: 10/12/2023 9:09 AM Injection made incrementally with aspirations every 5 mL.  Performed by: Personally   Additional Notes: Pt identified in Holding room.  Monitors applied. Working IV access confirmed. Timeout, Sterile prep R lateral knee.  #21ga ECHOgenic Arrow block needle to sciatic nerve at split in pop fossa with US guidance.  20cc 0.5% Bupivacaine with 1:200k epi injected incrementally after negative test dose.  Patient asymptomatic, VSS, no heme aspirated, tolerated well.   Sandford Craze, MD

## 2023-10-12 NOTE — Anesthesia Procedure Notes (Signed)
Anesthesia Regional Block: Adductor canal block   Pre-Anesthetic Checklist: , timeout performed,  Correct Patient, Correct Site, Correct Laterality,  Correct Procedure, Correct Position, site marked,  Risks and benefits discussed,  Surgical consent,  Pre-op evaluation,  At surgeon's request and post-op pain management  Laterality: Right and Lower  Prep: chloraprep       Needles:  Injection technique: Single-shot  Needle Type: Echogenic Needle     Needle Length: 9cm  Needle Gauge: 21     Additional Needles:   Procedures:,,,, ultrasound used (permanent image in chart),,    Narrative:  Start time: 10/12/2023 8:59 AM End time: 10/12/2023 9:05 AM Injection made incrementally with aspirations every 5 mL.  Performed by: Personally  Anesthesiologist: Jairo Ben, MD  Additional Notes: Pt identified in Holding room.  Monitors applied. Working IV access confirmed. Timeout, Sterile prep R thigh.  #21ga ECHOgenic Arrow block needle into adductor canal with US guidance.  10cc 0.5% Bupivacaine 1:200k epi injected incrementally after negative test dose.  Patient asymptomatic, VSS, no heme aspirated, tolerated well.   Sandford Craze, MD

## 2023-10-12 NOTE — Anesthesia Postprocedure Evaluation (Signed)
Anesthesia Post Note  Patient: [name]  Procedure(s) Performed: RIGHT OPEN REDUCTION INTERNAL FIXATION (ORIF) ANKLE FRACTURE (Right: Ankle)     Patient location during evaluation: PACU Anesthesia Type: MAC Level of consciousness: awake and alert, patient cooperative and oriented Pain management: pain level controlled Vital Signs Assessment: post-procedure vital signs reviewed and stable Respiratory status: nonlabored ventilation, spontaneous breathing, respiratory function stable and patient connected to nasal cannula oxygen Cardiovascular status: blood pressure returned to baseline and stable Postop Assessment: no apparent nausea or vomiting Anesthetic complications: no   No notable events documented.  Last Vitals:  Vitals:   10/12/23 1130 10/12/23 1145  BP: 114/70 116/71  Pulse: 68 71  Resp: 12 13  Temp: 36.7 C   SpO2: 100% 98%    Last Pain:  Vitals:   10/12/23 1130  TempSrc:   PainSc: 0-No pain                 Ambrose Wile,E. Reina Wilton

## 2023-10-12 NOTE — Discharge Instructions (Signed)
Discharge Instructions    Attending Surgeon: Huel Cote, MD Office Phone Number: [phone]   Diagnosis and Procedures:    Surgeries Performed: Right ankle fixation  Discharge Plan:    Diet: Resume usual diet. Begin with light or bland foods.  Drink plenty of fluids.  Activity:  Weightbearing as tolerated right ankle. You are advised to go home directly from the hospital or surgical center. Restrict your activities.  GENERAL INSTRUCTIONS: 1.  Please apply ice to your wound to help with swelling and inflammation. This will improve your comfort and your overall recovery following surgery.     2. Please call Dr. Serena Croissant office at [phone] with questions Monday-Friday during business hours. If no one answers, please leave a message and someone should get back to the patient within 24 hours. For emergencies please call 911 or proceed to the emergency room.   3. Patient to notify surgical team if experiences any of the following: Bowel/Bladder dysfunction, uncontrolled pain, nerve/muscle weakness, incision with increased drainage or redness, nausea/vomiting and Fever greater than 101.0 F.  Be alert for signs of infection including redness, streaking, odor, fever or chills. Be alert for excessive pain or bleeding and notify your surgeon immediately.  WOUND INSTRUCTIONS:   Leave your dressing, cast, or splint in place until your post operative visit.  Keep it clean and dry.  Always keep the incision clean and dry until the staples/sutures are removed. If there is no drainage from the incision you should keep it open to air. If there is drainage from the incision you must keep it covered at all times until the drainage stops  Do not soak in a bath tub, hot tub, pool, lake or other body of water until 21 days after your surgery and your incision is completely dry and healed.  If you have removable sutures (or staples) they must be removed 10-14 days (unless otherwise  instructed) from the day of your surgery.     1)  Elevate the extremity as much as possible.  2)  Keep the dressing clean and dry.  3)  Please call us if the dressing becomes wet or dirty.  4)  If you are experiencing worsening pain or worsening swelling, please call.     MEDICATIONS: Resume all previous home medications at the previous prescribed dose and frequency unless otherwise noted Start taking the  pain medications on an as-needed basis as prescribed  Please taper down pain medication over the next week following surgery.  Ideally you should not require a refill of any narcotic pain medication.  Take pain medication with food to minimize nausea. In addition to the prescribed pain medication, you may take over-the-counter pain relievers such as Tylenol.  Do NOT take additional tylenol if your pain medication already has tylenol in it.  Aspirin 325mg  daily per instructions on bottle. Narcotic policy: Per [facility] clinic policy, our goal is ensure optimal postoperative pain control with a multimodal pain management strategy. For all [facility] patients, our goal is to wean post-operative narcotic medications by 6 weeks post-operatively, and many times sooner. If this is not possible due to utilization of pain medication prior to surgery, your [facility] doctor will support your acute post-operative pain control for the first 6 weeks postoperatively, with a plan to transition you back to your primary pain team following that. Cyndia Skeeters will work to ensure a Therapist, occupational.       FOLLOWUP INSTRUCTIONS: 1. Follow up at the Physical Therapy Clinic 3-4  days following surgery. This appointment should be scheduled unless other arrangements have been made.The Physical Therapy scheduling number is [phone] if an appointment has not already been arranged.  2. Contact Dr. Serena Croissant office during office hours at [phone] or the practice after hours line at [phone] for non-emergencies.  For medical emergencies call 911.   Discharge Location: Home

## 2023-10-12 NOTE — H&P (Signed)
Chief Complaint: Right ankle fracture        History of Present Illness:    Discussed the use of AI scribe software for clinical note transcription with the patient, who gave verbal consent to proceed.   [name] is a 74 year old female with osteoporosis who presents with a bimalleolar ankle fracture after a fall. She is accompanied by her daughter, [name].   She sustained a bimalleolar ankle fracture involving both the medial and lateral malleolus, with mild displacement of the fibula, after a fall. She experiences pain and swelling in the ankle, with subsequent bruising. No numbness or tingling in the foot. She uses a walker with a seat for mobility since the injury.   The fall occurred on Monday evening around 7:30 PM while she was standing and texting. She felt hot and decided to sit down but fell flat on her back, resulting in a knot on the back of her head and the ankle injury. She was taken to the emergency department for evaluation.   She was diagnosed with a concussion in the emergency department, with initial symptoms including a knot on the back of the head. The knot has since resolved, but she reports persistent soreness.   Earlier on the day of the fall, she had a tooth extraction and an implant placed which made her feel unwell. She was on oxycodone for pain management.   Her past medical history includes COPD, for which she uses oxygen, and osteoporosis. She has a history of gastroenteritis, which affects her ability to take aspirin.     Surgical History:   None   PMH/PSH/Family History/Social History/Meds/Allergies:         Past Medical History:  Diagnosis Date   Anemia      B12 deficient   B12 deficiency     Breast cancer (HCC)      left breast.   Chronic SI joint pain      Bilateral, Dr. Corliss Skains   Fibromyalgia      Dr. Corliss Skains   GERD (gastroesophageal reflux disease)     Greater trochanteric bursitis of both hips       Dr. Corliss Skains   Hyperlipidemia     IBS (irritable bowel syndrome)     LV dysfunction      iatrogenic from chemotherapy ; on Carvedilol    Migraines     Osteoporosis      Dr Lomax---> transferring to a new gyn   Oxygen dependent      3 liters             Past Surgical History:  Procedure Laterality Date   ABDOMINAL HYSTERECTOMY        BSO for  Endometriosis   CARPAL TUNNEL RELEASE        left   CERVICAL DISC SURGERY       COLONOSCOPY W/ POLYPECTOMY   1999    Dr Kinnie Scales   ESOPHAGEAL DILATION   2005    Dr Kinnie Scales   MASTECTOMY   2009    bilateral, Dr Leafy Ro            Social History         Socioeconomic History   Marital status: Widowed      Spouse name: Not on file   Number of children: 1   Years of education: Not on file   Highest education level: 12th grade  Occupational History   Occupation: retired, Warehouse manager  Tobacco Use   Smoking status: Former      Current packs/day: 0.00      Average packs/day: 1 pack/day for 30.0 years (30.0 ttl pk-yrs)      Types: Cigarettes      Start date: 08/31/1960      Quit date: 08/31/1990      Years since quitting: 33.1      Passive exposure: Never   Smokeless tobacco: Never  Vaping Use   Vaping status: Never Used  Substance and Sexual Activity   Alcohol use: No   Drug use: Not Currently      Types: Marijuana      Comment: 08/01/2017 last used    Sexual activity: Not Currently  Other Topics Concern   Not on file  Social History Narrative    Lives w/ husband    Social Drivers of Health        Financial Resource Strain: Low Risk  (05/11/2023)    Overall Financial Resource Strain (CARDIA)     Difficulty of Paying Living Expenses: Not hard at all  Food Insecurity: No Food Insecurity (05/11/2023)    Hunger Vital Sign     Worried About Running Out of Food in the Last Year: Never true     Ran Out of Food in the Last Year: Never true  Transportation Needs: No Transportation Needs (05/11/2023)    PRAPARE -  Therapist, art (Medical): No     Lack of Transportation (Non-Medical): No  Physical Activity: Inactive (05/11/2023)    Exercise Vital Sign     Days of Exercise per Week: 0 days     Minutes of Exercise per Session: 0 min  Stress: No Stress Concern Present (05/11/2023)    [employer] of Occupational Health - Occupational Stress Questionnaire     Feeling of Stress : Not at all  Social Connections: Socially Isolated (05/11/2023)    Social Connection and Isolation Panel [NHANES]     Frequency of Communication with Friends and Family: More than three times a week     Frequency of Social Gatherings with Friends and Family: More than three times a week     Attends Religious Services: Never     Database administrator or Organizations: No     Attends Banker Meetings: Never     Marital Status: Widowed         Family History  Problem Relation Age of Onset   Diabetes Mother     Breast cancer Mother     Depression Mother          anxiety   Heart disease Mother          in her 83s   Colon cancer Sister     Healthy Daughter     Stroke Neg Hx          Allergies       Allergies  Allergen Reactions   Amoxicillin-Pot Clavulanate        diarrhea   Doxycycline        Nausea & vomiting   Venlafaxine        ? Reaction; ? Blurred vision            Current Outpatient Medications  Medication Sig Dispense Refill   albuterol (VENTOLIN HFA) 108 (90 Base) MCG/ACT inhaler Inhale 2 puffs every 6 hours as needed 8 g 12   Calcium Carbonate (CALTRATE 600 PO) Take by mouth.  citalopram (CELEXA) 40 MG tablet Take 40 mg by mouth daily.       diltiazem (CARDIZEM CD) 120 MG 24 hr capsule Take 1 capsule (120 mg total) by mouth daily. 90 capsule 3   hydrOXYzine (ATARAX/VISTARIL) 10 MG tablet Take 10 mg by mouth every 6 (six) hours as needed for anxiety.       Magnesium 400 MG CAPS Take by mouth.       methocarbamol (ROBAXIN) 750 MG tablet TAKE 1 TABLET BY  MOUTH 2 TIMES DAILY AS NEEDED FOR MUSCLE SPASMS (NON FORMULARY) 60 tablet 2   Multiple Vitamins-Minerals (MULTIVITAMIN WITH MINERALS) tablet Take 1 tablet by mouth daily.       umeclidinium bromide (INCRUSE ELLIPTA) 62.5 MCG/ACT AEPB Inhale 1 puff into the lungs daily. 30 each 12      No current facility-administered medications for this visit.      Imaging Results (Last 48 hours)  No results found.     Review of Systems:   A ROS was performed including pertinent positives and negatives as documented in the HPI.   Physical Exam :   Constitutional: NAD and appears stated age Neurological: Alert and oriented Psych: Appropriate affect and cooperative There were no vitals taken for this visit.    Comprehensive Musculoskeletal Exam:     Physical Exam   EXTREMITIES: Bruising and swelling on ankle. Capillary refill time approximately five seconds on toes. NEUROLOGICAL: Sensation to light touch intact on toes. Good movement of toes.       Imaging:   Xray review from ED on 10/03/22 (right ankle 3 views): Bimalleolar ankle fracture involving nondisplaced transverse fracture of the medial malleolus and comminuted distal fibula fracture with mild lateral displacement.  Lateral soft tissue edema.     I personally reviewed and interpreted the radiographs.     Assessment:     Bimalleolar Ankle Fracture   Following a fall on 10/04/2023, there is pain and swelling with X-ray confirmation of fractures on both sides of the ankle and mild displacement of the fibula fracture. Conservative management and surgical intervention were discussed, highlighting the risks and benefits of each. Plan for surgical intervention early next week. Obtain clearances from pulmonology and cardiology. Pre-operative medications discussed and will be sent to pharmacy by Dr. Steward Drone.  I did discuss overall conservative versus nonconservative management.  At this point she does have a bimalleolar equivalent type ankle  fracture.  Given this I did discuss that I would likely consider nonweightbearing in the absence of fixation.  I do believe that fixation will allow for earlier weightbearing.  I believe this would allow her more independent mobility at home.  Given this discussion with her and her daughter we will plan for fixation and overnight admission with physical therapy  Post-operative Rehabilitation   Inpatient rehabilitation post-surgery may be necessary due to the inability to be self-supporting immediately after the operation. Will consider overnight stay in the hospital with possible discharge to SNF versus home.  Plan :     - Plan for right ankle open reduction internal fixation         I personally saw and evaluated the patient, and participated in the management and treatment plan.

## 2023-10-12 NOTE — Telephone Encounter (Signed)
06/21/23 note faxed to [facility] attn April [phone].

## 2023-10-12 NOTE — Transfer of Care (Signed)
Immediate Anesthesia Transfer of Care Note  Patient: [name]  Procedure(s) Performed: RIGHT OPEN REDUCTION INTERNAL FIXATION (ORIF) ANKLE FRACTURE (Right: Ankle)  Patient Location: PACU  Anesthesia Type:MAC  Level of Consciousness: awake, alert , and oriented  Airway & Oxygen Therapy: Patient Spontanous Breathing and Patient connected to face mask oxygen  Post-op Assessment: Report given to RN and Post -op Vital signs reviewed and stable  Post vital signs: Reviewed and stable  Last Vitals:  Vitals Value Taken Time  BP 120/70 10/12/23 1045  Temp 36.6 C 10/12/23 1038  Pulse 78 10/12/23 1045  Resp 13 10/12/23 1045  SpO2 100 % 10/12/23 1045  Vitals shown include unfiled device data.  Last Pain:  Vitals:   10/12/23 0910  TempSrc:   PainSc: 0-No pain         Complications: No notable events documented.

## 2023-10-12 NOTE — Progress Notes (Signed)
Patient arrived to unit from PACU pt is alert and oriented, verbally speaking to staff, pt on portable O2 at 3L via [place], she was placed on wall oxygen at her baseline of 3L via [place] [facility], RLE elevated, bed in low position, wheels locked, call bell within reach and she was oriented to room, daughter [name] at bedside.

## 2023-10-12 NOTE — Op Note (Signed)
Date of Surgery: 10/12/2023  INDICATIONS: [name] is a 73 y.o.-year-old female with Right bimalleolar ankle Peterson.  The risk and benefits of the procedure were discussed in detail and documented in the pre-operative evaluation.   PREOPERATIVE DIAGNOSIS: 1.  Right ankle bimalleolar Peterson  POSTOPERATIVE DIAGNOSIS: Same.  PROCEDURE: 1.  Open reduction internal fixation bimalleolar ankle Peterson  SURGEON: Benancio Deeds MD  ASSISTANT: Ardeen Fillers, ATC  ANESTHESIA:  general peripheral nerve block  IV FLUIDS AND URINE: See anesthesia record.  ANTIBIOTICS: Ancef  ESTIMATED BLOOD LOSS: 10 mL.  IMPLANTS:  Implant Name Type Inv. Item Serial No. Manufacturer Lot No. LRB No. Used Action  SCREW CORT 3.5X17 ST EVOS - [id number] Screw SCREW CORT 3.5X17 ST EVOS  [employer]  Right 4 Implanted  PLATE FIB EVOS 5H R 2.7X93 - [id number] Plate PLATE FIB EVOS 5H R 2.7X93  [employer]  Right 1 Implanted  SCREW CORT 2.7X16 ST EVOS - [id number] Screw SCREW CORT 2.7X16 ST EVOS  [employer]  Right 1 Implanted  SCREW CANN 4.0X14 - [id number] Screw SCREW CANN 4.0X14  [employer]  Right 1 Implanted  SCREW BONE 4.0X12 CANC - [id number] Screw SCREW BONE 4.0X12 CANC  [employer]  Right 1 Implanted  SCREW CORT 2.7X16 STAR T8 EVOS - [id number] Screw SCREW CORT 2.7X16 STAR T8 EVOS  [employer]  Right 2 Implanted  SCREW CANNULATED 4.0X60 - [id number] Screw SCREW CANNULATED 4.0X60  [employer]  Right 2 Implanted    DRAINS: None  CULTURES: None  COMPLICATIONS: none  DESCRIPTION OF PROCEDURE:   I identified the patient in the pre-operative holding area.  I marked the operative right with my initials. I reviewed the risks and benefits of the proposed surgical intervention and the patient (and/or patient's guardian) wished to proceed.  Anesthesia was then performed with a regional  block.  The patient was transferred to the operative suite and placed in the supine position with all bony prominences padded.     SCDs were placed on the non-operative lower extremity. Appropriate antibiotics was administered within 1 hour before incision. The operative extremity was then prepped and draped in standard fashion. A time out was performed confirming the correct extremity, correct patient and correct procedure.   A lateral incision over the fibula was made centered around the Peterson. The incision was taken sharply down to bone. Full thickness anterior and posterior flaps were made. The Peterson site was cleared of debris. Reduction clamps were used to mobilize the Peterson ends and hold the Peterson in place. Fluoroscopy was used to confirm proper alignment.  Posterior lateral locking plate was used.  A 3.5 screw was placed in the buttress hole.  Additional screws were placed with excellent positioning on the AP and lateral fluoroscopy.  At this time the distal screws were placed.  A mixture of the osteoporosis and normal screws were initially first placed under direct fluoroscopy followed by 2 additional locking screws.   Next, attention was turned to the medial side.  Two pins were placed percutaneously from the distal aspect of the medial malleolus into the tibial bone with care not to penetrate the joint. The screws were overdrilled. Two 60mm screws were placed. Fluoroscopy was used to confirm placement.  The wounds were thoroughly irrigated. The incisions were closed with vertical mattress 3-0 nylons. The incisions were dressed with xeroform, 4x4s, webril, and bias. A boot was placed.  POSTOPERATIVE PLAN: To be weightbearing as tolerated on the right ankle.  She will be placed on aspirin for blood clot prevention.  She will be seen by physical therapy and evaluated for possible need for rehab placement  Benancio Deeds, MD 10:41 AM

## 2023-10-12 NOTE — Anesthesia Preprocedure Evaluation (Addendum)
Anesthesia Evaluation  Patient identified by MRN, date of birth, ID band Patient awake    Reviewed: Allergy & Precautions, NPO status , Patient's Chart, lab work & pertinent test results  History of Anesthesia Complications Negative for: history of anesthetic complications  Airway Mallampati: II  TM Distance: >3 FB Neck ROM: Full    Dental  (+) Caps, Implants, Dental Advisory Given, Lower Dentures   Pulmonary COPD,  oxygen dependent, former smoker   breath sounds clear to auscultation       Cardiovascular hypertension, Pt. on medications (-) angina  Rhythm:Regular Rate:Normal  11/2022 ECHO: EF 60 to 65%.  1. The LV has normal function, no regional wall motion abnormalities. Grade I diastolic  dysfunction (impaired relaxation).   2. RVF is normal. The right ventricular size is normal. There is normal pulmonary artery systolic pressure. The estimated right ventricular systolic pressure is 32.6 mmHg.   3. The mitral valve is normal in structure. Mild mitral valve regurgitation. No evidence of mitral stenosis.   4. The aortic valve is normal in structure. Aortic valve regurgitation is not visualized. No aortic stenosis is present.     Neuro/Psych  Headaches  Anxiety Depression       GI/Hepatic Neg liver ROS,GERD  Controlled and Medicated,,  Endo/Other  negative endocrine ROS    Renal/GU negative Renal ROS     Musculoskeletal  (+) Arthritis ,  Fibromyalgia -  Abdominal   Peds  Hematology negative hematology ROS (+) Blood dyscrasia (Hb 10), anemia   Anesthesia Other Findings   Reproductive/Obstetrics                             Anesthesia Physical Anesthesia Plan  ASA: 3  Anesthesia Plan: MAC and Regional   Post-op Pain Management: Tylenol PO (pre-op)* and Regional block*   Induction: Intravenous  PONV Risk Score and Plan: 2 and Ondansetron and Dexamethasone  Airway Management Planned:  Natural Airway and Simple Face Mask  Additional Equipment: None  Intra-op Plan:   Post-operative Plan:   Informed Consent: I have reviewed the patients History and Physical, chart, labs and discussed the procedure including the risks, benefits and alternatives for the proposed anesthesia with the patient or authorized representative who has indicated his/her understanding and acceptance.     Dental advisory given  Plan Discussed with: CRNA and Surgeon  Anesthesia Plan Comments: (Plan routine monitors, MAC with adductor canal and popliteal blocks for post op analgesia)       Anesthesia Quick Evaluation

## 2023-10-13 DIAGNOSIS — S82841A Displaced bimalleolar fracture of right lower leg, initial encounter for closed fracture: Secondary | ICD-10-CM | POA: Diagnosis not present

## 2023-10-13 DIAGNOSIS — J449 Chronic obstructive pulmonary disease, unspecified: Secondary | ICD-10-CM | POA: Diagnosis not present

## 2023-10-13 DIAGNOSIS — Z853 Personal history of malignant neoplasm of breast: Secondary | ICD-10-CM | POA: Diagnosis not present

## 2023-10-13 DIAGNOSIS — Z87891 Personal history of nicotine dependence: Secondary | ICD-10-CM | POA: Diagnosis not present

## 2023-10-13 DIAGNOSIS — Z79899 Other long term (current) drug therapy: Secondary | ICD-10-CM | POA: Diagnosis not present

## 2023-10-13 MED ORDER — OXYCODONE HCL 5 MG PO TABS: 5.0000 mg | ORAL_TABLET | ORAL | 0 refills | Status: DC | PRN

## 2023-10-13 NOTE — TOC Initial Note (Signed)
Transition of Care [facility]) - Initial/Assessment Note    Patient Details  Name: [name] MRN: [id number] Date of Birth: 1951/06/22  Transition [facility] Care [facility]) CM/SW Contact:    Lorri Frederick, LCSW Phone Number: 10/13/2023, 4:03 PM  Clinical Narrative:    CSW met with pt regarding PT recommendation for SNF.  Pt agreeable to SNF, medicare choice document provider, permission given to send out referral in hub.  Permission given to speak with daughter [name], sister Talbert Forest.  Pt from home alone, no current services.  Referral sent out in hub for SNF.                Expected Discharge Plan: Skilled Nursing Facility Barriers to Discharge: Continued Medical Work up, SNF Pending bed offer   Patient Goals and CMS Choice Patient states their goals for this hospitalization and ongoing recovery are:: better than normal CMS Medicare.gov Compare Post Acute Care list provided to:: Patient Choice offered to / list presented to : Patient      Expected Discharge Plan and Services In-house Referral: Clinical Social Work   Post Acute Care Choice: Skilled Nursing Facility Living arrangements for the past 2 months: Single Family Home                                      Prior Living Arrangements/Services Living arrangements for the past 2 months: Single Family Home Lives with:: Self Patient language and need for interpreter reviewed:: Yes Do you feel safe going back to the place where you live?: Yes      Need for Family Participation in Patient Care: Yes (Comment) Care giver support system in place?: Yes (comment) Current home services: Other (comment) (none) Criminal Activity/Legal Involvement Pertinent to Current Situation/Hospitalization: No - Comment as needed  Activities of Daily Living   ADL Screening (condition at time of admission) Independently performs ADLs?: No Is the patient deaf or have difficulty hearing?: Yes Does the patient have difficulty seeing, even  when wearing glasses/contacts?: Yes Does the patient have difficulty concentrating, remembering, or making decisions?: No  Permission Sought/Granted Permission sought to share information with : Family Supports Permission granted to share information with : Yes, Verbal Permission Granted  Share Information with NAME: daughter [name]  Permission granted to share info w AGENCY: SNF        Emotional Assessment Appearance:: Appears stated age Attitude/Demeanor/Rapport: Engaged Affect (typically observed): Appropriate, Pleasant Orientation: : Oriented to Self, Oriented to Place, Oriented to  Time, Oriented to Situation      Admission diagnosis:  Closed bimalleolar fracture of right ankle, initial encounter [W09.811B] Patient Active Problem List   Diagnosis Date Noted   Closed bimalleolar fracture of right ankle 10/12/2023   Closed bimalleolar fracture of right ankle, initial encounter 10/12/2023   Atrial tachycardia (HCC) 06/05/2023   Chronic respiratory failure with hypoxia (HCC) 02/19/2023   Protein-calorie malnutrition, severe (HCC) 12/24/2022   Tachyarrhythmia 12/22/2022   Hypokalemia 12/22/2022   Estrogen deficiency 06/28/2019   Osteopenia after menopause 11/06/2017   MDD (major depressive disorder), recurrent episode, moderate (HCC) 09/22/2017   Suicide attempt (HCC)    B12 deficiency 08/03/2017   Other insomnia 11/23/2016   DDD (degenerative disc disease), cervical 11/23/2016   DDD (degenerative disc disease), lumbar 11/23/2016   Other idiopathic scoliosis, lumbar region 11/23/2016   COPD mixed type (HCC) 11/20/2013   Vitamin D deficiency 07/06/2011   CARDIOMYOPATHY, PRIMARY, DILATED  01/01/2009   Malignant tumor of breast (HCC) 07/31/2008   Mixed hyperlipidemia 10/18/2007   Esophagitis 10/18/2007   GAD (generalized anxiety disorder) 10/12/2007   IBS 10/12/2007   Primary osteoarthritis of both hands 10/12/2007   Osteoporosis 10/12/2007   History of colonic polyps  10/12/2007   MIGRAINES, HX OF 10/12/2007   Fibromyalgia--Dr. Victory Dakin, on Ultram 07/14/2007   PCP:  Melida Quitter, PA Pharmacy:   [id number] - [place], [place] - [address] [place] [address] Phone: [phone] Fax: [phone]  [facility] [address] [place] [address] Phone: [phone] Fax: [phone]     Social Drivers of Health (SDOH) Social History: SDOH Screenings   Food Insecurity: No Food Insecurity (10/12/2023)  Housing: Low Risk  (10/13/2023)  Transportation Needs: No Transportation Needs (10/12/2023)  Utilities: Not At Risk (10/12/2023)  Alcohol Screen: Low Risk  (05/11/2023)  Depression (PHQ2-9): Medium Risk (09/16/2023)  Financial Resource Strain: Low Risk  (05/11/2023)  Physical Activity: Inactive (05/11/2023)  Social Connections: Socially Isolated (10/13/2023)  Stress: No Stress Concern Present (05/11/2023)  Tobacco Use: Medium Risk (10/12/2023)  Health Literacy: Adequate Health Literacy (05/11/2023)   SDOH Interventions: Housing Interventions: Intervention Not Indicated Social Connections Interventions: Intervention Not Indicated   Readmission Risk Interventions     No data to display

## 2023-10-13 NOTE — NC FL2 (Signed)
[place] MEDICAID FL2 LEVEL OF CARE FORM     IDENTIFICATION  Patient Name: [name] Birthdate: [birth year] Sex: female Admission Date (Current Location): 10/12/2023  [facility] and [place] Number:  Producer, television/film/video and Address:  [facility], [address], [place], [place] [address]      Provider Number: [id number]  Attending Physician Name and Address:  Huel Cote, MD  Relative Name and Phone Number:  Kathaleen Bury   [phone]    Current Level of Care: Hospital Recommended Level of Care: Skilled Nursing Facility Prior Approval Number:    Date Approved/Denied:   PASRR Number:    Discharge Plan: SNF    Current Diagnoses: Patient Active Problem List   Diagnosis Date Noted   Closed bimalleolar fracture of right ankle 10/12/2023   Closed bimalleolar fracture of right ankle, initial encounter 10/12/2023   Atrial tachycardia (HCC) 06/05/2023   Chronic respiratory failure with hypoxia (HCC) 02/19/2023   Protein-calorie malnutrition, severe (HCC) 12/24/2022   Tachyarrhythmia 12/22/2022   Hypokalemia 12/22/2022   Estrogen deficiency 06/28/2019   Osteopenia after menopause 11/06/2017   MDD (major depressive disorder), recurrent episode, moderate (HCC) 09/22/2017   Suicide attempt (HCC)    B12 deficiency 08/03/2017   Other insomnia 11/23/2016   DDD (degenerative disc disease), cervical 11/23/2016   DDD (degenerative disc disease), lumbar 11/23/2016   Other idiopathic scoliosis, lumbar region 11/23/2016   COPD mixed type (HCC) 11/20/2013   Vitamin D deficiency 07/06/2011   CARDIOMYOPATHY, PRIMARY, DILATED 01/01/2009   Malignant tumor of breast (HCC) 07/31/2008   Mixed hyperlipidemia 10/18/2007   Esophagitis 10/18/2007   GAD (generalized anxiety disorder) 10/12/2007   IBS 10/12/2007   Primary osteoarthritis of both hands 10/12/2007   Osteoporosis 10/12/2007   History of colonic polyps 10/12/2007   MIGRAINES, HX OF  10/12/2007   Fibromyalgia--Dr. Victory Dakin, on Ultram 07/14/2007    Orientation RESPIRATION BLADDER Height & Weight     Self, Time, Situation, Place  O2 Continent Weight: 92 lb (41.7 kg) Height:  5' (152.4 cm)  BEHAVIORAL SYMPTOMS/MOOD NEUROLOGICAL BOWEL NUTRITION STATUS      Continent Diet (see discharge summary)  AMBULATORY STATUS COMMUNICATION OF NEEDS Skin   Limited Assist Verbally Surgical wounds, Other (Comment) (ecchymosis, redness)                       Personal Care Assistance Level of Assistance  Bathing, Feeding, Dressing Bathing Assistance: Limited assistance Feeding assistance: Independent Dressing Assistance: Limited assistance     Functional Limitations Info  Sight, Hearing, Speech Sight Info: Adequate Hearing Info: Adequate Speech Info: Adequate    SPECIAL CARE FACTORS FREQUENCY  PT (By licensed PT), OT (By licensed OT)     PT Frequency: 5x week OT Frequency: 5x week            Contractures Contractures Info: Not present    Additional Factors Info  Code Status, Allergies Code Status Info: full Allergies Info: Amoxicillin-pot Clavulanate, Doxycycline, Venlafaxine           Current Medications (10/13/2023):  This is the current hospital active medication list Current Facility-Administered Medications  Medication Dose Route Frequency Provider Last Rate Last Admin   acetaminophen (TYLENOL) tablet 325-650 mg  325-650 mg Oral Q6H PRN Huel Cote, MD       albuterol (PROVENTIL) (2.5 MG/3ML) 0.083% nebulizer solution 2.5 mg  2.5 mg Inhalation Q6H PRN Huel Cote, MD       aspirin tablet 325 mg  325 mg Oral Daily Huel Cote, MD   325 mg at 10/13/23 4098   citalopram (CELEXA) tablet 40 mg  40 mg Oral Daily Huel Cote, MD   40 mg at 10/13/23 0908   diltiazem (CARDIZEM CD) 24 hr capsule 120 mg  120 mg Oral Daily Huel Cote, MD   120 mg at 10/13/23 0908   docusate sodium (COLACE) capsule 100 mg  100 mg Oral BID Huel Cote, MD    100 mg at 10/13/23 1191   HYDROmorphone (DILAUDID) injection 0.5-1 mg  0.5-1 mg Intravenous Q4H PRN Huel Cote, MD       hydrOXYzine (ATARAX) tablet 10 mg  10 mg Oral Q6H PRN Huel Cote, MD       magnesium oxide (MAG-OX) tablet 400 mg  400 mg Oral Daily Huel Cote, MD       multivitamin with minerals tablet 1 tablet  1 tablet Oral Daily Huel Cote, MD   1 tablet at 10/12/23 1535   ondansetron (ZOFRAN) tablet 4 mg  4 mg Oral Q6H PRN Huel Cote, MD   4 mg at 10/13/23 1304   Or   ondansetron (ZOFRAN) injection 4 mg  4 mg Intravenous Q6H PRN Huel Cote, MD       oxyCODONE (Oxy IR/ROXICODONE) immediate release tablet 10-15 mg  10-15 mg Oral Q4H PRN Huel Cote, MD   10 mg at 10/13/23 0108   oxyCODONE (Oxy IR/ROXICODONE) immediate release tablet 5-10 mg  5-10 mg Oral Q4H PRN Huel Cote, MD   10 mg at 10/13/23 1308   umeclidinium bromide (INCRUSE ELLIPTA) 62.5 MCG/ACT 1 puff  1 puff Inhalation Daily Huel Cote, MD   1 puff at 10/13/23 4782   zinc sulfate (50mg  elemental zinc) capsule 220 mg  220 mg Oral Daily Huel Cote, MD   220 mg at 10/13/23 9562     Discharge Medications: Please see discharge summary for a list of discharge medications.  Relevant Imaging Results:  Relevant Lab Results:   Additional Information SSN: [id number]  Lorri Frederick, LCSW

## 2023-10-13 NOTE — Progress Notes (Signed)
   Subjective:  Patient reports pain as mild.Voiding, tolerating diet. Pending PT eval.  Objective:   VITALS:   Vitals:   10/12/23 1300 10/12/23 1356 10/12/23 2026 10/13/23 0446  BP: 120/65 119/71 106/62 (!) 97/58  Pulse: 70 78 79 79  Resp: 12 15 18 16   Temp: 98.1 F (36.7 C) 98.4 F (36.9 C) 99.3 F (37.4 C) 98.9 F (37.2 C)  TempSrc:  Oral  Oral  SpO2: 100% 100% 100% 100%  Weight:      Height:        Boot in place, fires EHL/plantar flexors. SILT all toes, wwp Lab Results  Component Value Date   WBC 8.3 10/05/2023   HGB 10.0 (L) 10/05/2023   HCT 30.9 (L) 10/05/2023   MCV 94.2 10/05/2023   PLT 243 10/05/2023     Assessment/Plan:  1 Day Post-Op right bimalleolar ankle fracture doing well.   - Patient to work with PT to optimize mobilization safely - DVT ppx - SCDs, ambulation, Aspirin - WBAT operative extremity - Pain control - multimodal pain management, ATC acetaminophen in conjunction with as needed narcotic (oxycodone), although this should be minimized with other modalities  - Discharge planning pending PT eval, patient may require SNF placement given her mobility  [name] [facility] 10/13/2023, 6:51 AM

## 2023-10-13 NOTE — Care Management Obs Status (Signed)
MEDICARE OBSERVATION STATUS NOTIFICATION   Patient Details  Name: [name] MRN: [id number] Date of Birth: 1951-07-27   Medicare Observation Status Notification Given:  Yes    Lawerance Sabal, RN 10/13/2023, 10:50 AM

## 2023-10-13 NOTE — Evaluation (Addendum)
Physical Therapy Evaluation Patient Details Name: [name] MRN: [id number] DOB: 07/28/1951 Today's Date: 10/13/2023  History of Present Illness  73 y.o. female presents to [facility] 10/12/23 after falling with R bimalleolar ankle fx involving medial and lateral malleolus w/ mild displacement of fibula. Pt also diagnosed with concussion in ED. S/p ORIF of R bimalleolar fx 2/11. PMHx: osteoporosis, COPD on 3L, fibromyalgia, chronic SI joint paint   Clinical Impression  Pt in bed upon arrival and agreeable to PT eval. Prior to admit, pt was ModI with rollator. Pt reports two prior falls due to losing her balance. In today's session, pt was able to stand with MinA and RW. Pt had a posterior lean and needed assist to correct. Pt was unable to bear weight on R LE 2/2 pain, however, pt was able to perform stand pivot to [facility] with MinA. Pt was able to perform pericare and don/doff underwear with assist for balance. Pt felt slightly nauseous after transfer, RN notified. Pt presents to therapy session with decreased balance, R>L strength, and mobility. Pt would benefit from acute skilled PT to address functional impairments. Recommending post-acute rehab <3hrs to work towards independence with mobility. Acute PT to follow.          If plan is discharge home, recommend the following: A lot of help with walking and/or transfers;A lot of help with bathing/dressing/bathroom;Assist for transportation;Help with stairs or ramp for entrance;Assistance with cooking/housework   Can travel by private vehicle   No    Equipment Recommendations BSC/3in1;Wheelchair cushion (measurements PT);Wheelchair (measurements PT)  Recommendations for Other Services  OT consult    Functional Status Assessment Patient has had a recent decline in their functional status and demonstrates the ability to make significant improvements in function in a reasonable and predictable amount of time.     Precautions / Restrictions  Precautions Precautions: Fall Precaution/Restrictions Comments: on 3L at baseline, diagnosed with concussion in ED Required Braces or Orthoses: Other Brace Other Brace: R CAM boot Restrictions Weight Bearing Restrictions Per Provider Order: Yes RLE Weight Bearing Per Provider Order: Weight bearing as tolerated      Mobility  Bed Mobility Overal bed mobility: Needs Assistance Bed Mobility: Supine to Sit, Sit to Supine    Supine to sit: Contact guard, HOB elevated, Used rails Sit to supine: Min assist, HOB elevated   General bed mobility comments: MinA to return to supine for R LE management    Transfers Overall transfer level: Needs assistance Equipment used: Rolling walker (2 wheels) Transfers: Sit to/from Stand, Bed to chair/wheelchair/BSC Sit to Stand: Min assist Stand pivot transfers: Min assist      General transfer comment: MinA for steadying with rise, pt with posterior lean requiring assist to correct. Pt unable to tolerate WB on R LE. Able to perform stand pivot with heel toe method to/from [facility].    Ambulation/Gait     General Gait Details: deferred    Balance Overall balance assessment: Needs assistance, Mild deficits observed, not formally tested, History of Falls Sitting-balance support: No upper extremity supported, Feet supported Sitting balance-Leahy Scale: Good   Postural control: Posterior lean Standing balance support: Bilateral upper extremity supported, During functional activity, Reliant on assistive device for balance Standing balance-Leahy Scale: Poor Standing balance comment: reliant on UE support and external support       Pertinent Vitals/Pain Pain Assessment Pain Assessment: Faces Faces Pain Scale: Hurts even more Pain Location: R ankle Pain Descriptors / Indicators: Aching, Discomfort Pain Intervention(s): Limited activity within  patient's tolerance, Monitored during session, Repositioned, Premedicated before session    Home Living  Family/patient expects to be discharged to:: Private residence Living Arrangements: Alone Available Help at Discharge: Family;Available PRN/intermittently Type of Home: House Home Access: Level entry  Home Layout: One level Home Equipment: Grab bars - tub/shower;Cane Dance movement psychotherapist (4 wheels)      Prior Function Prior Level of Function : Independent/Modified Independent;Driving;History of Falls (last six months)      Mobility Comments: ModI with rollator, fell after turning around to sit in a chair ADLs Comments: ind     Extremity/Trunk Assessment   Upper Extremity Assessment Upper Extremity Assessment: Defer to OT evaluation    Lower Extremity Assessment Lower Extremity Assessment: Generalized weakness (R>L)    Cervical / Trunk Assessment Cervical / Trunk Assessment: Normal  Communication   Communication Communication: Impaired Factors Affecting Communication: Hearing impaired    Cognition Arousal: Alert Behavior During Therapy: WFL for tasks assessed/performed   PT - Cognitive impairments: No apparent impairments    Following commands: Intact       Cueing Cueing Techniques: Verbal cues     General Comments General comments (skin integrity, edema, etc.): VSS on 3L, brusing on R lower leg. Denied h/a, changes in vision, and sensitivity to light/noise. Educated on symptoms of concussion and to notify provider if she notices worsening symptoms     PT Assessment Patient needs continued PT services  PT Problem List Decreased strength;Decreased range of motion;Decreased activity tolerance;Decreased balance;Decreased mobility;Decreased knowledge of use of DME;Cardiopulmonary status limiting activity       PT Treatment Interventions DME instruction;Gait training;Functional mobility training;Therapeutic activities;Therapeutic exercise;Neuromuscular re-education;Balance training;Patient/family education    PT Goals (Current goals can be found in the Care Plan section)   Acute Rehab PT Goals Patient Stated Goal: to be able to walk PT Goal Formulation: With patient Time For Goal Achievement: 10/27/23 Potential to Achieve Goals: Good    Frequency Min 1X/week        AM-PAC PT "6 Clicks" Mobility  Outcome Measure Help needed turning from your back to your side while in a flat bed without using bedrails?: A Little Help needed moving from lying on your back to sitting on the side of a flat bed without using bedrails?: A Little Help needed moving to and from a bed to a chair (including a wheelchair)?: A Little Help needed standing up from a chair using your arms (e.g., wheelchair or bedside chair)?: A Little Help needed to walk in hospital room?: Total Help needed climbing 3-5 steps with a railing? : Total 6 Click Score: 14    End of Session Equipment Utilized During Treatment: Gait belt;Oxygen;Other (comment) (R CAM boot) Activity Tolerance: Patient tolerated treatment well Patient left: in bed;with call bell/phone within reach;with bed alarm set Nurse Communication: Mobility status;Other (comment) (pt requesting anti-nausea meds) PT Visit Diagnosis: Unsteadiness on feet (R26.81);Muscle weakness (generalized) (M62.81);History of falling (Z91.81)    Time: 4098-1191 PT Time Calculation (min) (ACUTE ONLY): 27 min   Charges:   PT Evaluation $PT Eval Low Complexity: 1 Low PT Treatments $Therapeutic Activity: 8-22 mins PT General Charges $$ ACUTE PT VISIT: 1 Visit    Hilton Cork, PT, DPT Secure Chat Preferred  Rehab Office [phone]   Arturo Morton Brion Aliment 10/13/2023, 1:26 PM

## 2023-10-13 NOTE — Progress Notes (Signed)
RE:  [name]      Date of Birth: Jul 06, 2051      Date:   10/13/23       To Whom It May Concern:  Please be advised that the above-named patient will require a short-term nursing home stay - anticipated 30 days or less for rehabilitation and strengthening.  The plan is for return home.                 MD signature        Date

## 2023-10-14 DIAGNOSIS — S82841A Displaced bimalleolar fracture of right lower leg, initial encounter for closed fracture: Secondary | ICD-10-CM | POA: Diagnosis not present

## 2023-10-14 DIAGNOSIS — Z853 Personal history of malignant neoplasm of breast: Secondary | ICD-10-CM | POA: Diagnosis not present

## 2023-10-14 DIAGNOSIS — Z87891 Personal history of nicotine dependence: Secondary | ICD-10-CM | POA: Diagnosis not present

## 2023-10-14 DIAGNOSIS — J449 Chronic obstructive pulmonary disease, unspecified: Secondary | ICD-10-CM | POA: Diagnosis not present

## 2023-10-14 DIAGNOSIS — Z79899 Other long term (current) drug therapy: Secondary | ICD-10-CM | POA: Diagnosis not present

## 2023-10-14 MED ORDER — OXYCODONE HCL 5 MG PO TABS: 5.0000 mg | ORAL_TABLET | ORAL | 0 refills | Status: DC | PRN

## 2023-10-14 NOTE — Progress Notes (Signed)
   Subjective:  Patient reports pain as mild.Voiding, tolerating diet. Worked to ambulate with PT who recommend SNF.  Objective:   VITALS:   Vitals:   10/13/23 0832 10/13/23 0839 10/13/23 2000 10/14/23 0400  BP:  (!) 101/59 (!) 144/71 108/61  Pulse:  79 99 83  Resp:   19 18  Temp:  98.7 F (37.1 C) 99.8 F (37.7 C) 98.6 F (37 C)  TempSrc:  Oral Oral Oral  SpO2: 100% 100% 94% 99%  Weight:      Height:        Boot in place, fires EHL/plantar flexors. SILT all toes, wwp Lab Results  Component Value Date   WBC 8.3 10/05/2023   HGB 10.0 (L) 10/05/2023   HCT 30.9 (L) 10/05/2023   MCV 94.2 10/05/2023   PLT 243 10/05/2023     Assessment/Plan:  2 Days Post-Op right bimalleolar ankle fracture doing well.   - Patient to work with PT to optimize mobilization safely - DVT ppx - SCDs, ambulation, Aspirin - WBAT operative extremity - Pain control - multimodal pain management, ATC acetaminophen in conjunction with as needed narcotic (oxycodone), although this should be minimized with other modalities  - Discharge planning pending SNF placement  [name] 10/14/2023, 7:11 AM

## 2023-10-14 NOTE — TOC Progression Note (Addendum)
Transition of Care [facility]) - Progression Note    Patient Details  Name: [name] MRN: [id number] Date of Birth: 03-Apr-1951  Transition of Care [facility]) CM/SW Contact  Lorri Frederick, LCSW Phone Number: 10/14/2023, 10:05 AM  Clinical Narrative:   Bed offers provided to pt.  She will review.  1040: CSW spoke with pt again, she would like to accept offer at [facility].  SNF auth request submitted in navi and approved: [id number], 5 days: 2/13-2/17.  1200: [facility] does not have bed today, can receive pt tomorrow. MD notified.  Expected Discharge Plan: Skilled Nursing Facility Barriers to Discharge: Continued Medical Work up, SNF Pending bed offer  Expected Discharge Plan and Services In-house Referral: Clinical Social Work   Post Acute Care Choice: Skilled Nursing Facility Living arrangements for the past 2 months: Single Family Home                                       Social Determinants of Health (SDOH) Interventions SDOH Screenings   Food Insecurity: No Food Insecurity (10/12/2023)  Housing: Low Risk  (10/13/2023)  Transportation Needs: No Transportation Needs (10/12/2023)  Utilities: Not At Risk (10/12/2023)  Alcohol Screen: Low Risk  (05/11/2023)  Depression (PHQ2-9): Medium Risk (09/16/2023)  Financial Resource Strain: Low Risk  (05/11/2023)  Physical Activity: Inactive (05/11/2023)  Social Connections: Socially Isolated (10/13/2023)  Stress: No Stress Concern Present (05/11/2023)  Tobacco Use: Medium Risk (10/12/2023)  Health Literacy: Adequate Health Literacy (05/11/2023)    Readmission Risk Interventions     No data to display

## 2023-10-14 NOTE — Progress Notes (Signed)
Physical Therapy Treatment Patient Details Name: [name] MRN: [id number] DOB: 1951/01/06 Today's Date: 10/14/2023   History of Present Illness 73 y.o. female presents to [facility] 10/12/23 after falling with R bimalleolar ankle fx involving medial and lateral malleolus w/ mild displacement of fibula. Pt also diagnosed with concussion in ED. S/p ORIF of R bimalleolar fx 2/11. PMHx: osteoporosis, COPD on 3L, fibromyalgia, chronic SI joint paint    PT Comments  Pt received in supine, pleasantly agreeable to therapy session and with good participation and tolerance for transfer and very limited gait trial at bedside with RW support and min to modA for stability. Pt attempted sidesteps with rollator but tending to maintain brakes throughout and having difficulty managing device without mod/maxA from PTA, pt agreeable to continue using RW while pain remains severe to pivot OOB to BSC, etc. Pt given HEP handout and demo back with good technique. Pt will continue to benefit from skilled rehab in a post acute setting < 3 hours per day to maximize functional gains before returning home.    If plan is discharge home, recommend the following: A lot of help with walking and/or transfers;A lot of help with bathing/dressing/bathroom;Assist for transportation;Help with stairs or ramp for entrance;Assistance with cooking/housework   Can travel by private vehicle     No  Equipment Recommendations  BSC/3in1;Wheelchair cushion (measurements PT);Wheelchair (measurements PT);Rolling walker (2 wheels) (RW pending progress, may progress back to [phone])    Recommendations for Other Services       Precautions / Restrictions Precautions Precautions: Fall Recall of Precautions/Restrictions: Intact Precaution/Restrictions Comments: on 3L at baseline, diagnosed with concussion in ED Required Braces or Orthoses: Other Brace Other Brace: R CAM boot Restrictions Weight Bearing Restrictions Per Provider Order: Yes RLE  Weight Bearing Per Provider Order: Weight bearing as tolerated     Mobility  Bed Mobility Overal bed mobility: Needs Assistance Bed Mobility: Supine to Sit, Sit to Supine     Supine to sit: Supervision Sit to supine: Supervision   General bed mobility comments: no assist required, managing R LE with increased time    Transfers Overall transfer level: Needs assistance Equipment used: Rollator (4 wheels), Rolling walker (2 wheels) Transfers: Sit to/from Stand Sit to Stand: Min assist, Mod assist Stand pivot transfers: Mod assist         General transfer comment: min assist standing to locked rollator; up to modA standing to regular RW due to posterior instability upon standing; modA sidesteps toward her L side along EOB as pt tending to keep rollator brakes locked and unable to move AD laterally without heavy assist    Ambulation/Gait Ambulation/Gait assistance: Mod assist, Min assist Gait Distance (Feet): 4 Feet Assistive device: Rolling walker (2 wheels) Gait Pattern/deviations: Step-to pattern, Decreased stance time - right, Decreased weight shift to right, Leaning posteriorly, Narrow base of support       General Gait Details: limited distance to pivot to/from [facility], ~53ft x2 with RW, cues for better RW proximity and management, pt tending to TTWB on RLE due to pain. DOE 2/4 on 3L O2 [place]   Stairs             Wheelchair Mobility     Tilt Bed    Modified Rankin (Stroke Patients Only)       Balance Overall balance assessment: Needs assistance, Mild deficits observed, not formally tested, History of Falls Sitting-balance support: No upper extremity supported, Feet supported Sitting balance-Leahy Scale: Good     Standing balance  support: Bilateral upper extremity supported, During functional activity Standing balance-Leahy Scale: Poor Standing balance comment: reliant on UE support and external support                            Communication  Communication Communication: Impaired Factors Affecting Communication: Hearing impaired  Cognition Arousal: Alert Behavior During Therapy: WFL for tasks assessed/performed   PT - Cognitive impairments: No apparent impairments                         Following commands: Intact      Cueing Cueing Techniques: Verbal cues  Exercises Other Exercises Other Exercises: supine RLE AROM: heel slides, hip abduction, SAQ, QS, GS x5-10 reps ea Other Exercises: supine LLE AROM: ankle pumps, Quad sets x10 reps ea    General Comments General comments (skin integrity, edema, etc.): SpO2 WFL on 3L O2 [place], pt briefly removed [place] while seated and SpO2 <90% on RA, but improves when [place] reapplied.      Pertinent Vitals/Pain Pain Assessment Pain Assessment: 0-10 Pain Score: 5  Pain Location: R ankle Pain Descriptors / Indicators: Aching, Discomfort, Grimacing Pain Intervention(s): Limited activity within patient's tolerance, Monitored during session, Repositioned, Other (comment) (pt instructed to notify RN via call bell if she needs pain meds, pt expressed understanding)    Home Living                          Prior Function            PT Goals (current goals can now be found in the care plan section) Acute Rehab PT Goals Patient Stated Goal: to be able to walk PT Goal Formulation: With patient Time For Goal Achievement: 10/27/23 Progress towards PT goals: Progressing toward goals    Frequency    Min 1X/week      PT Plan      Co-evaluation              AM-PAC PT "6 Clicks" Mobility   Outcome Measure  Help needed turning from your back to your side while in a flat bed without using bedrails?: A Little Help needed moving from lying on your back to sitting on the side of a flat bed without using bedrails?: A Little Help needed moving to and from a bed to a chair (including a wheelchair)?: A Little Help needed standing up from a chair using your arms (e.g.,  wheelchair or bedside chair)?: A Lot Help needed to walk in hospital room?: Total Help needed climbing 3-5 steps with a railing? : Total 6 Click Score: 13    End of Session Equipment Utilized During Treatment: Gait belt;Oxygen;Other (comment) (RLE CAM boot donned throughout) Activity Tolerance: Patient tolerated treatment well;Patient limited by pain Patient left: in bed;with call bell/phone within reach;with bed alarm set;Other (comment) (RLE elevated on yellow bone foam) Nurse Communication: Mobility status;Other (comment) (pain score) PT Visit Diagnosis: Unsteadiness on feet (R26.81);Muscle weakness (generalized) (M62.81);History of falling (Z91.81)     Time: 1610-9604 PT Time Calculation (min) (ACUTE ONLY): 28 min  Charges:    $Therapeutic Exercise: 8-22 mins $Therapeutic Activity: 8-22 mins PT General Charges $$ ACUTE PT VISIT: 1 Visit                     Florena Kozma P., PTA Acute Rehabilitation Services Secure Chat Preferred 9a-5:30pm Office: [phone]    [facility]  M Shunda Rabadi 10/14/2023, 6:21 PM

## 2023-10-14 NOTE — Evaluation (Signed)
Occupational Therapy Evaluation Patient Details Name: [name] MRN: [id number] DOB: 01-Mar-1951 Today's Date: 10/14/2023   History of Present Illness   73 y.o. female presents to [facility] 10/12/23 after falling with R bimalleolar ankle fx involving medial and lateral malleolus w/ mild displacement of fibula. Pt also diagnosed with concussion in ED. S/p ORIF of R bimalleolar fx 2/11. PMHx: osteoporosis, COPD on 3L, fibromyalgia, chronic SI joint paint     Clinical Impressions PTA patient reports independent and driving. Admitted for above and presents with problem list below.  She completes bed mobility with supervision, transfers and side stepping towards [facility] with min assist using rollator and Adls with setup to mod assist.  She is motivated but limited by pain in R LE, has limited support at home.  Based on performance today, believe patient will best benefit from continued OT services acutely and after dc at inpatient setting with <3hrs/day to optimize independence, safety with ADLs and mobility.       If plan is discharge home, recommend the following:   A little help with walking and/or transfers;A lot of help with bathing/dressing/bathroom;Assistance with cooking/housework;Assist for transportation;Help with stairs or ramp for entrance     Functional Status Assessment   Patient has had a recent decline in their functional status and demonstrates the ability to make significant improvements in function in a reasonable and predictable amount of time.     Equipment Recommendations   BSC/3in1     Recommendations for Other Services         Precautions/Restrictions   Precautions Precautions: Fall Recall of Precautions/Restrictions: Intact Precaution/Restrictions Comments: on 3L at baseline, diagnosed with concussion in ED Required Braces or Orthoses: Other Brace Other Brace: R CAM boot Restrictions Weight Bearing Restrictions Per Provider Order: Yes RLE Weight  Bearing Per Provider Order: Weight bearing as tolerated     Mobility Bed Mobility Overal bed mobility: Needs Assistance Bed Mobility: Supine to Sit, Sit to Supine     Supine to sit: Supervision Sit to supine: Supervision   General bed mobility comments: no assist required, managing R LE with increased time    Transfers Overall transfer level: Needs assistance Equipment used: Rollator (4 wheels) Transfers: Sit to/from Stand Sit to Stand: Min assist           General transfer comment: min assist to stedy and for safety, min guard for side stepping towards HOB.      Balance Overall balance assessment: Needs assistance, Mild deficits observed, not formally tested, History of Falls Sitting-balance support: No upper extremity supported, Feet supported Sitting balance-Leahy Scale: Good     Standing balance support: Bilateral upper extremity supported, During functional activity Standing balance-Leahy Scale: Poor Standing balance comment: reliant on UE support and external support                           ADL either performed or assessed with clinical judgement   ADL Overall ADL's : Needs assistance/impaired     Grooming: Set up;Sitting           Upper Body Dressing : Set up;Sitting   Lower Body Dressing: Moderate assistance;Sit to/from stand Lower Body Dressing Details (indicate cue type and reason): able to manage L sock, but would need assist to managing clothing over hips and R LE Toilet Transfer: Rollator (4 wheels);Minimal assistance Toilet Transfer Details (indicate cue type and reason): simulated side stepping towards [facility] Toileting- Clothing Manipulation and Hygiene: Moderate assistance  Functional mobility during ADLs: Minimal assistance;Rollator (4 wheels) General ADL Comments: side stepping at [facility], pt declined OOB to recliner today     Vision   Vision Assessment?: No apparent visual deficits     Perception         Praxis          Pertinent Vitals/Pain Pain Assessment Pain Assessment: Faces Faces Pain Scale: Hurts little more Pain Location: R ankle Pain Descriptors / Indicators: Aching, Discomfort Pain Intervention(s): Limited activity within patient's tolerance, Monitored during session, Repositioned     Extremity/Trunk Assessment Upper Extremity Assessment Upper Extremity Assessment: Overall WFL for tasks assessed   Lower Extremity Assessment Lower Extremity Assessment: Defer to PT evaluation   Cervical / Trunk Assessment Cervical / Trunk Assessment: Normal   Communication Communication Communication: Impaired Factors Affecting Communication: Hearing impaired   Cognition Arousal: Alert Behavior During Therapy: WFL for tasks assessed/performed Cognition: No apparent impairments             OT - Cognition Comments: pt able to recall precautions and denies concusion symptoms at this time. Educated on symptoms to be aware of, pt voiced understanding and when to notify MD/RN.                 Following commands: Intact       Cueing  General Comments   Cueing Techniques: Verbal cues      Exercises     Shoulder Instructions      Home Living Family/patient expects to be discharged to:: Private residence Living Arrangements: Alone Available Help at Discharge: Family;Available PRN/intermittently Type of Home: House Home Access: Level entry     Home Layout: One level     Bathroom Shower/Tub: Producer, television/film/video: Handicapped height Bathroom Accessibility: Yes   Home Equipment: Grab bars - tub/shower;Cane - quad;Rollator (4 wheels);Shower seat          Prior Functioning/Environment Prior Level of Function : Independent/Modified Independent;Driving;History of Falls (last six months)             Mobility Comments: ModI with rollator, fell after turning around to sit in a chair ADLs Comments: ind ADLs and IADLs    OT Problem List: Decreased  strength;Decreased activity tolerance;Impaired balance (sitting and/or standing);Pain;Decreased knowledge of precautions;Decreased knowledge of use of DME or AE   OT Treatment/Interventions: Self-care/ADL training;Therapeutic exercise;DME and/or AE instruction;Therapeutic activities;Patient/family education;Balance training      OT Goals(Current goals can be found in the care plan section)   Acute Rehab OT Goals Patient Stated Goal: home, less pain OT Goal Formulation: With patient Time For Goal Achievement: 10/28/23 Potential to Achieve Goals: Good   OT Frequency:  Min 1X/week    Co-evaluation              AM-PAC OT "6 Clicks" Daily Activity     Outcome Measure Help from another person eating meals?: None Help from another person taking care of personal grooming?: A Little Help from another person toileting, which includes using toliet, bedpan, or urinal?: A Lot Help from another person bathing (including washing, rinsing, drying)?: A Lot Help from another person to put on and taking off regular upper body clothing?: A Little Help from another person to put on and taking off regular lower body clothing?: A Lot 6 Click Score: 16   End of Session Equipment Utilized During Treatment: Gait belt;Rollator (4 wheels) Nurse Communication: Mobility status  Activity Tolerance: Patient tolerated treatment well Patient left: in bed;with call bell/phone within  reach;with bed alarm set  OT Visit Diagnosis: Other abnormalities of gait and mobility (R26.89);Muscle weakness (generalized) (M62.81);History of falling (Z91.81);Pain Pain - Right/Left: Right Pain - part of body: Ankle and joints of foot                Time: 1610-9604 OT Time Calculation (min): 19 min Charges:  OT General Charges $OT Visit: 1 Visit OT Evaluation $OT Eval Moderate Complexity: 1 Mod  [name], OT Acute Rehabilitation Services Office [phone]   [name] 10/14/2023, 11:00 AM

## 2023-10-14 NOTE — Plan of Care (Signed)

## 2023-10-15 DIAGNOSIS — Z853 Personal history of malignant neoplasm of breast: Secondary | ICD-10-CM | POA: Diagnosis not present

## 2023-10-15 DIAGNOSIS — M8580 Other specified disorders of bone density and structure, unspecified site: Secondary | ICD-10-CM | POA: Diagnosis not present

## 2023-10-15 DIAGNOSIS — M4126 Other idiopathic scoliosis, lumbar region: Secondary | ICD-10-CM | POA: Diagnosis not present

## 2023-10-15 DIAGNOSIS — R531 Weakness: Secondary | ICD-10-CM | POA: Diagnosis not present

## 2023-10-15 DIAGNOSIS — S82841A Displaced bimalleolar fracture of right lower leg, initial encounter for closed fracture: Secondary | ICD-10-CM | POA: Diagnosis not present

## 2023-10-15 DIAGNOSIS — J9611 Chronic respiratory failure with hypoxia: Secondary | ICD-10-CM | POA: Diagnosis not present

## 2023-10-15 DIAGNOSIS — M5030 Other cervical disc degeneration, unspecified cervical region: Secondary | ICD-10-CM | POA: Diagnosis not present

## 2023-10-15 DIAGNOSIS — S82841D Displaced bimalleolar fracture of right lower leg, subsequent encounter for closed fracture with routine healing: Secondary | ICD-10-CM | POA: Diagnosis not present

## 2023-10-15 DIAGNOSIS — M810 Age-related osteoporosis without current pathological fracture: Secondary | ICD-10-CM | POA: Diagnosis not present

## 2023-10-15 DIAGNOSIS — R110 Nausea: Secondary | ICD-10-CM | POA: Diagnosis not present

## 2023-10-15 DIAGNOSIS — Z9181 History of falling: Secondary | ICD-10-CM | POA: Diagnosis not present

## 2023-10-15 DIAGNOSIS — J449 Chronic obstructive pulmonary disease, unspecified: Secondary | ICD-10-CM | POA: Diagnosis not present

## 2023-10-15 DIAGNOSIS — M797 Fibromyalgia: Secondary | ICD-10-CM | POA: Diagnosis not present

## 2023-10-15 DIAGNOSIS — I428 Other cardiomyopathies: Secondary | ICD-10-CM | POA: Diagnosis not present

## 2023-10-15 DIAGNOSIS — K5900 Constipation, unspecified: Secondary | ICD-10-CM | POA: Diagnosis not present

## 2023-10-15 DIAGNOSIS — Z743 Need for continuous supervision: Secondary | ICD-10-CM | POA: Diagnosis not present

## 2023-10-15 DIAGNOSIS — R262 Difficulty in walking, not elsewhere classified: Secondary | ICD-10-CM | POA: Diagnosis not present

## 2023-10-15 DIAGNOSIS — Z87891 Personal history of nicotine dependence: Secondary | ICD-10-CM | POA: Diagnosis not present

## 2023-10-15 DIAGNOSIS — I4719 Other supraventricular tachycardia: Secondary | ICD-10-CM | POA: Diagnosis not present

## 2023-10-15 DIAGNOSIS — M6281 Muscle weakness (generalized): Secondary | ICD-10-CM | POA: Diagnosis not present

## 2023-10-15 DIAGNOSIS — S8251XD Displaced fracture of medial malleolus of right tibia, subsequent encounter for closed fracture with routine healing: Secondary | ICD-10-CM | POA: Diagnosis not present

## 2023-10-15 DIAGNOSIS — S82401D Unspecified fracture of shaft of right fibula, subsequent encounter for closed fracture with routine healing: Secondary | ICD-10-CM | POA: Diagnosis not present

## 2023-10-15 DIAGNOSIS — E43 Unspecified severe protein-calorie malnutrition: Secondary | ICD-10-CM | POA: Diagnosis not present

## 2023-10-15 DIAGNOSIS — Z79899 Other long term (current) drug therapy: Secondary | ICD-10-CM | POA: Diagnosis not present

## 2023-10-15 DIAGNOSIS — Z5971 Insufficient health insurance coverage: Secondary | ICD-10-CM | POA: Diagnosis not present

## 2023-10-15 DIAGNOSIS — K589 Irritable bowel syndrome without diarrhea: Secondary | ICD-10-CM | POA: Diagnosis not present

## 2023-10-15 DIAGNOSIS — Z7189 Other specified counseling: Secondary | ICD-10-CM | POA: Diagnosis not present

## 2023-10-15 MED ORDER — OXYCODONE HCL 5 MG PO TABS: 5.0000 mg | ORAL_TABLET | ORAL | 0 refills | Status: DC | PRN

## 2023-10-15 MED ORDER — FLEET ENEMA RE ENEM
1.0000 | ENEMA | Freq: Once | RECTAL | Status: AC
  Administered 2023-10-15: 1 via RECTAL
  Filled 2023-10-15: qty 1

## 2023-10-15 NOTE — Progress Notes (Signed)
Patient discharge to [facility], report called. [facility] to provide transportation.

## 2023-10-15 NOTE — TOC Transition Note (Signed)
Transition of Care [facility]) - Discharge Note   Patient Details  Name: [name] MRN: [id number] Date of Birth: 27-Jul-1951  [facility]) CM/SW Contact:  Lorri Frederick, LCSW Phone Number: 10/15/2023, 3:17 PM   Clinical Narrative:   Pt discharging to [facility].  RN call report to [phone].    Final next level of care: Skilled Nursing Facility Barriers to Discharge: Barriers Resolved   Patient Goals and CMS Choice Patient states their goals for this hospitalization and ongoing recovery are:: better than normal CMS Medicare.gov Compare Post Acute Care list provided to:: Patient Choice offered to / list presented to : Patient      Discharge Placement              Patient chooses bed at: [facility] Patient to be transferred to facility by: ptar Name of family member notified: daughter [name] Patient and family notified of of transfer: 10/15/23  Discharge Plan and Services Additional resources added to the After Visit Summary for   In-house Referral: Clinical Social Work   Post Acute Care Choice: Skilled Nursing Facility                               Social Drivers of Health (SDOH) Interventions SDOH Screenings   Food Insecurity: No Food Insecurity (10/12/2023)  Housing: Low Risk  (10/13/2023)  Transportation Needs: No Transportation Needs (10/12/2023)  Utilities: Not At Risk (10/12/2023)  Alcohol Screen: Low Risk  (05/11/2023)  Depression (PHQ2-9): Medium Risk (09/16/2023)  Financial Resource Strain: Low Risk  (05/11/2023)  Physical Activity: Inactive (05/11/2023)  Social Connections: Socially Isolated (10/13/2023)  Stress: No Stress Concern Present (05/11/2023)  Tobacco Use: Medium Risk (10/12/2023)  Health Literacy: Adequate Health Literacy (05/11/2023)     Readmission Risk Interventions     No data to display

## 2023-10-15 NOTE — Progress Notes (Signed)
Patient c/o severe abdominal pain, nausea, and reports she has not had a bowel movement since 10/07/23 but is passing gas. Abdomen is distended and firm.   Notified Steward Drone, MD see new orders

## 2023-10-15 NOTE — Discharge Summary (Signed)
Patient ID: [name] MRN: [id number] DOB/AGE: 01/02/1951 73 y.o.  Admit date: 10/12/2023 Discharge date: 10/15/2023  Admission Diagnoses:  Closed bimalleolar fracture of right ankle, initial encounter  Discharge Diagnoses:  Principal Problem:   Closed bimalleolar fracture of right ankle, initial encounter Active Problems:   Closed bimalleolar fracture of right ankle   Past Medical History:  Diagnosis Date   Anemia    B12 deficient   Arthritis    B12 deficiency    Breast cancer (HCC)    left breast.   Chronic SI joint pain    Bilateral, Dr. Corliss Skains   COPD (chronic obstructive pulmonary disease) (HCC)    Fibromyalgia    Dr. Corliss Skains   GERD (gastroesophageal reflux disease)    Greater trochanteric bursitis of both hips    Dr. Corliss Skains   Hyperlipidemia    IBS (irritable bowel syndrome)    LV dysfunction    iatrogenic from chemotherapy ; on Carvedilol    Migraines    Osteoporosis    Dr Lomax---> transferring to a new gyn   Oxygen dependent    3 liters    Surgeries: Procedure(s): RIGHT OPEN REDUCTION INTERNAL FIXATION (ORIF) ANKLE FRACTURE on 10/12/2023   Consultants (if any):   Discharged Condition: Improved  Hospital Course: [name] is an 73 y.o. female who was admitted 10/12/2023 with a diagnosis of Closed bimalleolar fracture of right ankle, initial encounter and went to the operating room on 10/12/2023 and underwent the above named procedures.    She was given perioperative antibiotics:  Anti-infectives (From admission, onward)    None     .  She was given sequential compression devices, early ambulation, and appropriate chemoprophylaxis for DVT prophylaxis.  She benefited maximally from the hospital stay and there were no complications.    Recent vital signs:  Vitals:   10/14/23 2039 10/15/23 0400  BP: 102/62 (!) 100/59  Pulse: 76 80  Resp: 19 18  Temp: 98.1 F (36.7 C) 98.9 F (37.2 C)  SpO2: 99% 100%    Recent  laboratory studies:  Lab Results  Component Value Date   HGB 10.0 (L) 10/05/2023   HGB 11.1 08/30/2023   HGB 10.8 (L) 03/02/2023   Lab Results  Component Value Date   WBC 8.3 10/05/2023   PLT 243 10/05/2023   No results found for: "INR" Lab Results  Component Value Date   NA 137 10/05/2023   K 3.9 10/05/2023   CL 99 10/05/2023   CO2 31 10/05/2023   BUN 24 (H) 10/05/2023   CREATININE 0.67 10/05/2023   GLUCOSE 105 (H) 10/05/2023    Discharge Medications:   Allergies as of 10/15/2023       Reactions   Amoxicillin-pot Clavulanate    diarrhea   Doxycycline    Nausea & vomiting   Venlafaxine    ? Reaction; ? Blurred vision        Medication List     TAKE these medications    albuterol 108 (90 Base) MCG/ACT inhaler Commonly known as: VENTOLIN HFA Inhale 2 puffs every 6 hours as needed   CALTRATE 600 PO Take 600 mg by mouth daily. Vit d3   citalopram 40 MG tablet Commonly known as: CELEXA Take 40 mg by mouth daily.   diltiazem 120 MG 24 hr capsule Commonly known as: CARDIZEM CD Take 1 capsule (120 mg total) by mouth daily.   hydrOXYzine 10 MG tablet Commonly known as: ATARAX Take 10 mg by mouth every  6 (six) hours as needed for anxiety.   Magnesium 300 MG Caps Take 300 mg by mouth daily.   methocarbamol 750 MG tablet Commonly known as: ROBAXIN TAKE 1 TABLET BY MOUTH 2 TIMES DAILY AS NEEDED FOR MUSCLE SPASMS (NON FORMULARY)   multivitamin with minerals tablet Take 1 tablet by mouth daily.   oxyCODONE 5 MG immediate release tablet Commonly known as: Roxicodone Take 1 tablet (5 mg total) by mouth every 4 (four) hours as needed for severe pain (pain score 7-10) or breakthrough pain.   umeclidinium bromide 62.5 MCG/ACT Aepb Commonly known as: INCRUSE ELLIPTA Inhale 1 puff into the lungs daily.   zinc gluconate 50 MG tablet Take 50 mg by mouth daily.        Diagnostic Studies: DG Ankle Complete Right Result Date: 10/12/2023 CLINICAL DATA:   Elective surgery. EXAM: RIGHT ANKLE - COMPLETE 3+ VIEW COMPARISON:  10/04/2023 FINDINGS: Two fluoroscopic spot views of the ankle submitted from the operating room. Lateral plate and screw fixation of distal fibular fracture. Two screws traverse medial malleolar fracture. Fluoroscopy time 60 seconds. Dose 2.3 mGy. IMPRESSION: Procedural fluoroscopy during right ankle surgery. Electronically Signed   By: Narda Rutherford M.D.   On: 10/12/2023 11:59   DG C-Arm 1-60 Min-No Report Result Date: 10/12/2023 Fluoroscopy was utilized by the requesting physician.  No radiographic interpretation.   CT Head Wo Contrast Result Date: 10/05/2023 CLINICAL DATA:  Fall EXAM: CT HEAD WITHOUT CONTRAST CT CERVICAL SPINE WITHOUT CONTRAST TECHNIQUE: Multidetector CT imaging of the head and cervical spine was performed following the standard protocol without intravenous contrast. Multiplanar CT image reconstructions of the cervical spine were also generated. RADIATION DOSE REDUCTION: This exam was performed according to the departmental dose-optimization program which includes automated exposure control, adjustment of the mA and/or kV according to patient size and/or use of iterative reconstruction technique. COMPARISON:  None Available. FINDINGS: CT HEAD FINDINGS Brain: No mass,hemorrhage or extra-axial collection. Normal appearance of the parenchyma and CSF spaces. Vascular: No hyperdense vessel or unexpected vascular calcification. Skull: The visualized skull base, calvarium and extracranial soft tissues are normal. Sinuses/Orbits: Right frontal and ethmoid sinus opacification. Small amount of fluid in the right maxillary sinus. Mastoids are clear. Other: None. CT CERVICAL SPINE FINDINGS Alignment: Grade 1 anterolisthesis at C3-4 and C4-5 Skull base and vertebrae: C5-7 ACDF.  No acute fracture. Soft tissues and spinal canal: No prevertebral fluid or swelling. No visible canal hematoma. Disc levels: No advanced spinal canal or  neural foraminal stenosis. Upper chest: No pneumothorax, pulmonary nodule or pleural effusion. Other: Normal visualized paraspinal cervical soft tissues. IMPRESSION: 1. No acute intracranial abnormality. 2. No acute fracture or static subluxation of the cervical spine. 3. C5-7 ACDF without hardware complication. Electronically Signed   By: Deatra Robinson M.D.   On: 10/05/2023 02:16   CT Cervical Spine Wo Contrast Result Date: 10/05/2023 CLINICAL DATA:  Fall EXAM: CT HEAD WITHOUT CONTRAST CT CERVICAL SPINE WITHOUT CONTRAST TECHNIQUE: Multidetector CT imaging of the head and cervical spine was performed following the standard protocol without intravenous contrast. Multiplanar CT image reconstructions of the cervical spine were also generated. RADIATION DOSE REDUCTION: This exam was performed according to the departmental dose-optimization program which includes automated exposure control, adjustment of the mA and/or kV according to patient size and/or use of iterative reconstruction technique. COMPARISON:  None Available. FINDINGS: CT HEAD FINDINGS Brain: No mass,hemorrhage or extra-axial collection. Normal appearance of the parenchyma and CSF spaces. Vascular: No hyperdense vessel or unexpected vascular calcification.  Skull: The visualized skull base, calvarium and extracranial soft tissues are normal. Sinuses/Orbits: Right frontal and ethmoid sinus opacification. Small amount of fluid in the right maxillary sinus. Mastoids are clear. Other: None. CT CERVICAL SPINE FINDINGS Alignment: Grade 1 anterolisthesis at C3-4 and C4-5 Skull base and vertebrae: C5-7 ACDF.  No acute fracture. Soft tissues and spinal canal: No prevertebral fluid or swelling. No visible canal hematoma. Disc levels: No advanced spinal canal or neural foraminal stenosis. Upper chest: No pneumothorax, pulmonary nodule or pleural effusion. Other: Normal visualized paraspinal cervical soft tissues. IMPRESSION: 1. No acute intracranial abnormality. 2.  No acute fracture or static subluxation of the cervical spine. 3. C5-7 ACDF without hardware complication. Electronically Signed   By: Deatra Robinson M.D.   On: 10/05/2023 02:16   DG Ankle Complete Right Result Date: 10/04/2023 CLINICAL DATA:  Injury. Right ankle pain and swelling after a fall a few hours ago. EXAM: RIGHT ANKLE - COMPLETE 3+ VIEW COMPARISON:  Right foot 02/03/2009 FINDINGS: Comminuted fractures with mild impaction and minimal displacement involving the distal fibula. No intra articular involvement. Linear lucency suggested over the medial malleolus probably represents an additional nondisplaced fracture. No dislocation. Joint spaces are normal. Soft tissue swelling over the lateral ankle. IMPRESSION: Comminuted minimally displaced fractures of the distal fibula with overlying soft tissue swelling. Linear lucency over the medial malleolus likely represents an additional nondisplaced fracture. Electronically Signed   By: Burman Nieves M.D.   On: 10/04/2023 22:20    Disposition:      Follow-up Information     Huel Cote, MD Follow up.   Specialty: Orthopedic Surgery Contact information: [address] [place] [address] [phone]                  Signed: Huel Cote 10/15/2023, 7:03 AM

## 2023-10-15 NOTE — Progress Notes (Signed)
   Subjective:  Patient reports pain as mild.Voiding, tolerating diet. Worked to ambulate with PT. Pending SNF placement  Objective:   VITALS:   Vitals:   10/14/23 0810 10/14/23 1346 10/14/23 2039 10/15/23 0400  BP:  98/62 102/62 (!) 100/59  Pulse: 81 75 76 80  Resp: 16 16 19 18   Temp:  98.2 F (36.8 C) 98.1 F (36.7 C) 98.9 F (37.2 C)  TempSrc:  Oral Oral Oral  SpO2: 98% 97% 99% 100%  Weight:      Height:        Boot in place, fires EHL/plantar flexors. SILT all toes, wwp Lab Results  Component Value Date   WBC 8.3 10/05/2023   HGB 10.0 (L) 10/05/2023   HCT 30.9 (L) 10/05/2023   MCV 94.2 10/05/2023   PLT 243 10/05/2023     Assessment/Plan:  3 Days Post-Op right bimalleolar ankle fracture doing well.   - Patient to work with PT to optimize mobilization safely - DVT ppx - SCDs, ambulation, Aspirin - WBAT operative extremity - Pain control - multimodal pain management, ATC acetaminophen in conjunction with as needed narcotic (oxycodone), although this should be minimized with other modalities  - Discharge planning pending SNF placement  [name] 10/15/2023, 7:02 AM

## 2023-10-15 NOTE — Progress Notes (Signed)
Patient left floor via PTAR, confirmed she had all belongings.

## 2023-10-15 NOTE — TOC Progression Note (Signed)
Transition of Care [facility]) - Progression Note    Patient Details  Name: [name] MRN: [id number] Date of Birth: Mar 29, 1951  Transition of Care [facility]) CM/SW Contact  Lorri Frederick, LCSW Phone Number: 10/15/2023, 10:17 AM  Clinical Narrative:   CSW confirmed with [facility] that they can receive pt today. PASSR received: [id number] [id number]    Expected Discharge Plan: Skilled Nursing Facility Barriers to Discharge: Continued Medical Work up, SNF Pending bed offer  Expected Discharge Plan and Services In-house Referral: Clinical Social Work   Post Acute Care Choice: Skilled Nursing Facility Living arrangements for the past 2 months: Single Family Home Expected Discharge Date: 10/15/23                                     Social Determinants of Health (SDOH) Interventions SDOH Screenings   Food Insecurity: No Food Insecurity (10/12/2023)  Housing: Low Risk  (10/13/2023)  Transportation Needs: No Transportation Needs (10/12/2023)  Utilities: Not At Risk (10/12/2023)  Alcohol Screen: Low Risk  (05/11/2023)  Depression (PHQ2-9): Medium Risk (09/16/2023)  Financial Resource Strain: Low Risk  (05/11/2023)  Physical Activity: Inactive (05/11/2023)  Social Connections: Socially Isolated (10/13/2023)  Stress: No Stress Concern Present (05/11/2023)  Tobacco Use: Medium Risk (10/12/2023)  Health Literacy: Adequate Health Literacy (05/11/2023)    Readmission Risk Interventions     No data to display

## 2023-10-15 NOTE — Plan of Care (Signed)

## 2023-10-18 DIAGNOSIS — K589 Irritable bowel syndrome without diarrhea: Secondary | ICD-10-CM | POA: Diagnosis not present

## 2023-10-18 DIAGNOSIS — M5030 Other cervical disc degeneration, unspecified cervical region: Secondary | ICD-10-CM | POA: Diagnosis not present

## 2023-10-18 DIAGNOSIS — I4719 Other supraventricular tachycardia: Secondary | ICD-10-CM | POA: Diagnosis not present

## 2023-10-18 DIAGNOSIS — M4126 Other idiopathic scoliosis, lumbar region: Secondary | ICD-10-CM | POA: Diagnosis not present

## 2023-10-18 DIAGNOSIS — J9611 Chronic respiratory failure with hypoxia: Secondary | ICD-10-CM | POA: Diagnosis not present

## 2023-10-18 DIAGNOSIS — K5900 Constipation, unspecified: Secondary | ICD-10-CM | POA: Diagnosis not present

## 2023-10-18 DIAGNOSIS — J449 Chronic obstructive pulmonary disease, unspecified: Secondary | ICD-10-CM | POA: Diagnosis not present

## 2023-10-18 DIAGNOSIS — E43 Unspecified severe protein-calorie malnutrition: Secondary | ICD-10-CM | POA: Diagnosis not present

## 2023-10-18 DIAGNOSIS — Z9181 History of falling: Secondary | ICD-10-CM | POA: Diagnosis not present

## 2023-10-18 DIAGNOSIS — S82841D Displaced bimalleolar fracture of right lower leg, subsequent encounter for closed fracture with routine healing: Secondary | ICD-10-CM | POA: Diagnosis not present

## 2023-10-18 DIAGNOSIS — M8580 Other specified disorders of bone density and structure, unspecified site: Secondary | ICD-10-CM | POA: Diagnosis not present

## 2023-10-19 ENCOUNTER — Encounter (HOSPITAL_COMMUNITY): Payer: Self-pay | Admitting: Orthopaedic Surgery

## 2023-10-19 DIAGNOSIS — K5900 Constipation, unspecified: Secondary | ICD-10-CM | POA: Diagnosis not present

## 2023-10-19 DIAGNOSIS — S82841D Displaced bimalleolar fracture of right lower leg, subsequent encounter for closed fracture with routine healing: Secondary | ICD-10-CM | POA: Diagnosis not present

## 2023-10-19 DIAGNOSIS — M797 Fibromyalgia: Secondary | ICD-10-CM | POA: Diagnosis not present

## 2023-10-20 DIAGNOSIS — Z9181 History of falling: Secondary | ICD-10-CM | POA: Diagnosis not present

## 2023-10-20 DIAGNOSIS — E43 Unspecified severe protein-calorie malnutrition: Secondary | ICD-10-CM | POA: Diagnosis not present

## 2023-10-20 DIAGNOSIS — K5900 Constipation, unspecified: Secondary | ICD-10-CM | POA: Diagnosis not present

## 2023-10-20 DIAGNOSIS — J9611 Chronic respiratory failure with hypoxia: Secondary | ICD-10-CM | POA: Diagnosis not present

## 2023-10-20 DIAGNOSIS — S82841D Displaced bimalleolar fracture of right lower leg, subsequent encounter for closed fracture with routine healing: Secondary | ICD-10-CM | POA: Diagnosis not present

## 2023-10-21 ENCOUNTER — Other Ambulatory Visit: Payer: Self-pay | Admitting: *Deleted

## 2023-10-21 NOTE — Patient Outreach (Signed)
Mrs. [name] resides in [facility].  Screening for potential complex care management services as benefit of health plan and primary care provider.  Collaboration with Finis Bud social worker. Mrs. [name]'s goal is to return home alone.   Writer will continue to follow for transition plans and care management needs. Will plan outreach as appropriate.  Raiford Noble, MSN, RN, BSN [place]  [facility], Healthy Communities RN Post- Acute Care Manager Direct Dial: [phone]

## 2023-10-25 DIAGNOSIS — J9611 Chronic respiratory failure with hypoxia: Secondary | ICD-10-CM | POA: Diagnosis not present

## 2023-10-25 DIAGNOSIS — S82841D Displaced bimalleolar fracture of right lower leg, subsequent encounter for closed fracture with routine healing: Secondary | ICD-10-CM | POA: Diagnosis not present

## 2023-10-25 DIAGNOSIS — Z9181 History of falling: Secondary | ICD-10-CM | POA: Diagnosis not present

## 2023-10-25 DIAGNOSIS — E43 Unspecified severe protein-calorie malnutrition: Secondary | ICD-10-CM | POA: Diagnosis not present

## 2023-10-25 DIAGNOSIS — K5900 Constipation, unspecified: Secondary | ICD-10-CM | POA: Diagnosis not present

## 2023-10-26 DIAGNOSIS — M797 Fibromyalgia: Secondary | ICD-10-CM | POA: Diagnosis not present

## 2023-10-26 DIAGNOSIS — S82841D Displaced bimalleolar fracture of right lower leg, subsequent encounter for closed fracture with routine healing: Secondary | ICD-10-CM | POA: Diagnosis not present

## 2023-10-27 ENCOUNTER — Ambulatory Visit (HOSPITAL_BASED_OUTPATIENT_CLINIC_OR_DEPARTMENT_OTHER): Payer: Medicare Other | Admitting: Orthopaedic Surgery

## 2023-10-27 ENCOUNTER — Ambulatory Visit (HOSPITAL_BASED_OUTPATIENT_CLINIC_OR_DEPARTMENT_OTHER): Payer: Medicare Other

## 2023-10-27 DIAGNOSIS — S82401D Unspecified fracture of shaft of right fibula, subsequent encounter for closed fracture with routine healing: Secondary | ICD-10-CM | POA: Diagnosis not present

## 2023-10-27 DIAGNOSIS — S82841A Displaced bimalleolar fracture of right lower leg, initial encounter for closed fracture: Secondary | ICD-10-CM

## 2023-10-27 DIAGNOSIS — S8251XD Displaced fracture of medial malleolus of right tibia, subsequent encounter for closed fracture with routine healing: Secondary | ICD-10-CM | POA: Diagnosis not present

## 2023-10-27 NOTE — Progress Notes (Signed)
 Post Operative Evaluation    Procedure/Date of Surgery: Open reduction internal fixation right ankle 2/11  Interval History:   Presents 2 weeks status post the above procedure.  Overall she has been able to do some early walking.  This has been somewhat limited.  She has been having some soreness and tenderness about the ankle   PMH/PSH/Family History/Social History/Meds/Allergies:    Past Medical History:  Diagnosis Date   Anemia    B12 deficient   Arthritis    B12 deficiency    Breast cancer (HCC)    left breast.   Chronic SI joint pain    Bilateral, Dr. Corliss Skains   COPD (chronic obstructive pulmonary disease) (HCC)    Fibromyalgia    Dr. Corliss Skains   GERD (gastroesophageal reflux disease)    Greater trochanteric bursitis of both hips    Dr. Corliss Skains   Hyperlipidemia    IBS (irritable bowel syndrome)    LV dysfunction    iatrogenic from chemotherapy ; on Carvedilol    Migraines    Osteoporosis    Dr Lomax---> transferring to a new gyn   Oxygen dependent    3 liters   Past Surgical History:  Procedure Laterality Date   ABDOMINAL HYSTERECTOMY     BSO for  Endometriosis   CARPAL TUNNEL RELEASE     left   CERVICAL DISC SURGERY     COLONOSCOPY W/ POLYPECTOMY  1999   Dr Kinnie Scales   ESOPHAGEAL DILATION  2005   Dr Kinnie Scales   MASTECTOMY  2009   bilateral, Dr Jamey Ripa   ORIF ANKLE FRACTURE Right 10/12/2023   Procedure: RIGHT OPEN REDUCTION INTERNAL FIXATION (ORIF) ANKLE FRACTURE;  Surgeon: Huel Cote, MD;  Location: [facility] OR;  Service: Orthopedics;  Laterality: Right;   SEPTOPLASTY     Social History   Socioeconomic History   Marital status: Widowed    Spouse name: Not on file   Number of children: 1   Years of education: Not on file   Highest education level: 12th grade  Occupational History   Occupation: retired, Warehouse manager   Tobacco Use   Smoking status: Former    Current packs/day: 0.00    Average packs/day: 1 pack/day for  30.0 years (30.0 ttl pk-yrs)    Types: Cigarettes    Start date: 08/31/1960    Quit date: 08/31/1990    Years since quitting: 33.1    Passive exposure: Never   Smokeless tobacco: Never  Vaping Use   Vaping status: Never Used  Substance and Sexual Activity   Alcohol use: No   Drug use: Not Currently    Types: Marijuana    Comment: 08/01/2017 last used    Sexual activity: Not Currently  Other Topics Concern   Not on file  Social History Narrative   Lives w/ husband   Social Drivers of Health   Financial Resource Strain: Low Risk  (05/11/2023)   Overall Financial Resource Strain (CARDIA)    Difficulty of Paying Living Expenses: Not hard at all  Food Insecurity: No Food Insecurity (10/12/2023)   Hunger Vital Sign    Worried About Running Out of Food in the Last Year: Never true    Ran Out of Food in the Last Year: Never true  Transportation Needs: No Transportation Needs (10/12/2023)   PRAPARE - Transportation  Lack of Transportation (Medical): No    Lack of Transportation (Non-Medical): No  Physical Activity: Inactive (05/11/2023)   Exercise Vital Sign    Days of Exercise per Week: 0 days    Minutes of Exercise per Session: 0 min  Stress: No Stress Concern Present (05/11/2023)   [employer] of Occupational Health - Occupational Stress Questionnaire    Feeling of Stress : Not at all  Social Connections: Socially Isolated (10/13/2023)   Social Connection and Isolation Panel [NHANES]    Frequency of Communication with Friends and Family: More than three times a week    Frequency of Social Gatherings with Friends and Family: More than three times a week    Attends Religious Services: Never    Database administrator or Organizations: No    Attends Banker Meetings: Never    Marital Status: Widowed   Family History  Problem Relation Age of Onset   Diabetes Mother    Breast cancer Mother    Depression Mother        anxiety   Heart disease Mother        in her  81s   Colon cancer Sister    Healthy Daughter    Stroke Neg Hx    Allergies  Allergen Reactions   Amoxicillin-Pot Clavulanate     diarrhea   Doxycycline     Nausea & vomiting   Venlafaxine     ? Reaction; ? Blurred vision   Current Outpatient Medications  Medication Sig Dispense Refill   albuterol (VENTOLIN HFA) 108 (90 Base) MCG/ACT inhaler Inhale 2 puffs every 6 hours as needed 8 g 12   Calcium Carbonate (CALTRATE 600 PO) Take 600 mg by mouth daily. Vit d3     citalopram (CELEXA) 40 MG tablet Take 40 mg by mouth daily.     diltiazem (CARDIZEM CD) 120 MG 24 hr capsule Take 1 capsule (120 mg total) by mouth daily. 90 capsule 3   hydrOXYzine (ATARAX/VISTARIL) 10 MG tablet Take 10 mg by mouth every 6 (six) hours as needed for anxiety.     Magnesium 300 MG CAPS Take 300 mg by mouth daily.     methocarbamol (ROBAXIN) 750 MG tablet TAKE 1 TABLET BY MOUTH 2 TIMES DAILY AS NEEDED FOR MUSCLE SPASMS (NON FORMULARY) 60 tablet 2   Multiple Vitamins-Minerals (MULTIVITAMIN WITH MINERALS) tablet Take 1 tablet by mouth daily.     oxyCODONE (ROXICODONE) 5 MG immediate release tablet Take 1 tablet (5 mg total) by mouth every 4 (four) hours as needed for severe pain (pain score 7-10) or breakthrough pain. 30 tablet 0   umeclidinium bromide (INCRUSE ELLIPTA) 62.5 MCG/ACT AEPB Inhale 1 puff into the lungs daily. 30 each 12   zinc gluconate 50 MG tablet Take 50 mg by mouth daily.     No current facility-administered medications for this visit.   No results found.  Review of Systems:   A ROS was performed including pertinent positives and negatives as documented in the HPI.   Musculoskeletal Exam:    There were no vitals taken for this visit.  Right ankle with swelling and bruising are well-appearing incisions.  There is some skin irritation by the medial malleolus not near the incision.  No active drainage.  Mild swelling about the ankle which is improved  Imaging:    3 views right  ankle: Status post bimalleolar open reduction internal fixation without evidence of complication  I personally reviewed and interpreted the radiographs.  Assessment:   2 weeks status post right ankle bimalleolar ankle fracture open reduction internal fixation doing well.  At this time I would like her to wean out of the boot as this is causing irritation.  I will plan to see her back in 4 weeks for reassessment.  She will continue to be activity weightbearing as tolerated  Plan :    -Return to clinic 4 weeks for reassessment      I personally saw and evaluated the patient, and participated in the management and treatment plan.  Huel Cote, MD Attending Physician, Orthopedic Surgery  This document was dictated using Dragon voice recognition software. A reasonable attempt at proof reading has been made to minimize errors.

## 2023-10-28 ENCOUNTER — Other Ambulatory Visit: Payer: Self-pay | Admitting: *Deleted

## 2023-10-28 NOTE — Patient Outreach (Signed)
 Post-Acute Care Manager follow up. Screening for potential complex care management services as benefit of health plan and primary care provider.  Collaboration meeting with SNF social work Copywriter, advertising. [name] is slated to return home alone. She is working on short distances and stairs with therapy.  Met with [name] at bedside. Explained [facility] complex care management services. She is agreeable. Confirms PCP is with [facility]. Encouraged [name] to schedule PCP follow up appointment upon SNF discharge. [name] states her daughter assists with transportation and the like. Confirms best contact number for [name] is [phone]. Confirms she has been on home oxygen, it is not new.   Will continue to follow and will plan to refer to complex care management team upon SNF discharge.   Raiford Noble, MSN, RN, BSN [place]  [facility], Healthy Communities RN Post- Acute Care Manager Direct Dial: [phone]

## 2023-11-02 DIAGNOSIS — S82841D Displaced bimalleolar fracture of right lower leg, subsequent encounter for closed fracture with routine healing: Secondary | ICD-10-CM | POA: Diagnosis not present

## 2023-11-02 DIAGNOSIS — K5900 Constipation, unspecified: Secondary | ICD-10-CM | POA: Diagnosis not present

## 2023-11-02 DIAGNOSIS — Z9181 History of falling: Secondary | ICD-10-CM | POA: Diagnosis not present

## 2023-11-02 DIAGNOSIS — E43 Unspecified severe protein-calorie malnutrition: Secondary | ICD-10-CM | POA: Diagnosis not present

## 2023-11-02 DIAGNOSIS — J9611 Chronic respiratory failure with hypoxia: Secondary | ICD-10-CM | POA: Diagnosis not present

## 2023-11-04 ENCOUNTER — Other Ambulatory Visit: Payer: Self-pay | Admitting: *Deleted

## 2023-11-04 DIAGNOSIS — K5900 Constipation, unspecified: Secondary | ICD-10-CM | POA: Diagnosis not present

## 2023-11-04 DIAGNOSIS — Z7189 Other specified counseling: Secondary | ICD-10-CM | POA: Diagnosis not present

## 2023-11-04 DIAGNOSIS — Z9181 History of falling: Secondary | ICD-10-CM | POA: Diagnosis not present

## 2023-11-04 DIAGNOSIS — Z5971 Insufficient health insurance coverage: Secondary | ICD-10-CM | POA: Diagnosis not present

## 2023-11-04 DIAGNOSIS — M810 Age-related osteoporosis without current pathological fracture: Secondary | ICD-10-CM | POA: Diagnosis not present

## 2023-11-04 DIAGNOSIS — I4719 Other supraventricular tachycardia: Secondary | ICD-10-CM | POA: Diagnosis not present

## 2023-11-04 DIAGNOSIS — M797 Fibromyalgia: Secondary | ICD-10-CM | POA: Diagnosis not present

## 2023-11-04 DIAGNOSIS — J9611 Chronic respiratory failure with hypoxia: Secondary | ICD-10-CM | POA: Diagnosis not present

## 2023-11-04 DIAGNOSIS — K589 Irritable bowel syndrome without diarrhea: Secondary | ICD-10-CM | POA: Diagnosis not present

## 2023-11-04 DIAGNOSIS — E43 Unspecified severe protein-calorie malnutrition: Secondary | ICD-10-CM | POA: Diagnosis not present

## 2023-11-04 DIAGNOSIS — S82841D Displaced bimalleolar fracture of right lower leg, subsequent encounter for closed fracture with routine healing: Secondary | ICD-10-CM | POA: Diagnosis not present

## 2023-11-04 DIAGNOSIS — J449 Chronic obstructive pulmonary disease, unspecified: Secondary | ICD-10-CM | POA: Diagnosis not present

## 2023-11-04 NOTE — Patient Outreach (Signed)
 Post- Acute Care Manager follow up. Mrs. Rawl resides in [facility]. Mrs. Lucien previously agreed to [facility] complex care management as benefit of health plan and PCP.   Collaborative meeting with [facility] social work Copywriter, advertising. Mrs. Vale's insurance has issued discharge notice. Mrs. Schlosser has appealed discharge. Therapy reports Mrs. Covell's heart rate has been fluctuating. Family has requested cardiology appointment to be scheduled. Everardo Pacific, Child psychotherapist, reports daughter has indicated if Mrs. Baxley does not win appeal, she will return home on Saturday. [facility] for PT/OT/RN has been arranged.   Will follow up with [place] on appeals status. Will plan to make referral to [facility] CCM team upon SNF discharge.   Raiford Noble, MSN, RN, BSN [place]  [facility], Healthy Communities RN Post- Acute Care Manager Direct Dial: [phone]

## 2023-11-05 ENCOUNTER — Telehealth: Payer: Self-pay | Admitting: Cardiology

## 2023-11-05 DIAGNOSIS — J9611 Chronic respiratory failure with hypoxia: Secondary | ICD-10-CM | POA: Diagnosis not present

## 2023-11-05 DIAGNOSIS — S82841D Displaced bimalleolar fracture of right lower leg, subsequent encounter for closed fracture with routine healing: Secondary | ICD-10-CM | POA: Diagnosis not present

## 2023-11-05 DIAGNOSIS — I428 Other cardiomyopathies: Secondary | ICD-10-CM | POA: Diagnosis not present

## 2023-11-05 DIAGNOSIS — J449 Chronic obstructive pulmonary disease, unspecified: Secondary | ICD-10-CM | POA: Diagnosis not present

## 2023-11-05 DIAGNOSIS — I4719 Other supraventricular tachycardia: Secondary | ICD-10-CM | POA: Diagnosis not present

## 2023-11-05 NOTE — Telephone Encounter (Signed)
 Patient c/o Palpitations:  STAT if patient reporting lightheadedness, shortness of breath, or chest pain  How long have you had palpitations/irregular HR/ Afib? Are you having the symptoms now? Patient was in Tacycardia  and Palpitations yesterday. The NP at the facility wants t he patient to be seen  Are you currently experiencing lightheadedness, SOB or CP? no  Do you have a history of afib (atrial fibrillation) or irregular heart rhythm?  History of Tachycardia  Have you checked your BP or HR? (document readings if available): pulse 78 120/68 today  Are you experiencing any other symptoms? No- patient needs to be seenmo

## 2023-11-05 NOTE — Telephone Encounter (Signed)
 Called and spoke to [place] with [facility]  Shanda Bumps states:   -Patient had complaints of palpitations while walking with PT   -Her heart rate was 110-130   -Patient evaluated by NP who recommended cardiology follow up   -NP noted adding Lopressor Rx (unsure what dosing)   -patient continues to take Diltiazem 120mg    -Today BP 120/68 and HR: 78  -Patient did not have complaints of SOB   -Symptoms only occur while walking  Patient scheduled for OV 3/17 at 2:45pm with NP Janee Morn has no further questions at this time

## 2023-11-08 DIAGNOSIS — K5900 Constipation, unspecified: Secondary | ICD-10-CM | POA: Diagnosis not present

## 2023-11-08 DIAGNOSIS — R110 Nausea: Secondary | ICD-10-CM | POA: Diagnosis not present

## 2023-11-08 NOTE — Telephone Encounter (Signed)
 Rachael Rotunda, MD  You2 days ago    Continue with current meds until she is seen.   Noted  Patient has OV scheduled for 3/17

## 2023-11-10 ENCOUNTER — Other Ambulatory Visit: Payer: Self-pay | Admitting: *Deleted

## 2023-11-10 NOTE — Patient Outreach (Signed)
 [name] resides in [facility]. [name] previously agreeable to complex care management services as benefit of health plan and Primary Care Provider.  Secure communication sent to [employer] [facility] [employer] social work team to inquire about transition plans/date.  Will continue to follow and will refer to [facility] upon SNF discharge.   Raiford Noble, MSN, RN, BSN [place]  [facility], Healthy Communities RN Post- Acute Care Manager Direct Dial: [phone]

## 2023-11-12 DIAGNOSIS — S82841D Displaced bimalleolar fracture of right lower leg, subsequent encounter for closed fracture with routine healing: Secondary | ICD-10-CM | POA: Diagnosis not present

## 2023-11-12 DIAGNOSIS — K589 Irritable bowel syndrome without diarrhea: Secondary | ICD-10-CM | POA: Diagnosis not present

## 2023-11-12 DIAGNOSIS — J449 Chronic obstructive pulmonary disease, unspecified: Secondary | ICD-10-CM | POA: Diagnosis not present

## 2023-11-12 DIAGNOSIS — J9611 Chronic respiratory failure with hypoxia: Secondary | ICD-10-CM | POA: Diagnosis not present

## 2023-11-12 DIAGNOSIS — M4126 Other idiopathic scoliosis, lumbar region: Secondary | ICD-10-CM | POA: Diagnosis not present

## 2023-11-12 DIAGNOSIS — I4719 Other supraventricular tachycardia: Secondary | ICD-10-CM | POA: Diagnosis not present

## 2023-11-12 DIAGNOSIS — M797 Fibromyalgia: Secondary | ICD-10-CM | POA: Diagnosis not present

## 2023-11-12 DIAGNOSIS — M5030 Other cervical disc degeneration, unspecified cervical region: Secondary | ICD-10-CM | POA: Diagnosis not present

## 2023-11-12 DIAGNOSIS — K5900 Constipation, unspecified: Secondary | ICD-10-CM | POA: Diagnosis not present

## 2023-11-12 DIAGNOSIS — M810 Age-related osteoporosis without current pathological fracture: Secondary | ICD-10-CM | POA: Diagnosis not present

## 2023-11-13 NOTE — Progress Notes (Deleted)
 Cardiology Office Note    Date:  11/13/2023  ID:  [name], [name] Feb 05, 1951, MRN [id number] PCP:  Melida Quitter, PA  Cardiologist:  Rachael Rotunda, MD  Electrophysiologist:  None   Chief Complaint: ***  History of Present Illness: .    [name] is a 73 y.o. female with visit-pertinent history of ***  Labwork independently reviewed:   ROS: .   *** denies chest pain, shortness of breath, lower extremity edema, fatigue, palpitations, melena, hematuria, hemoptysis, diaphoresis, weakness, presyncope, syncope, orthopnea, and PND.  All other systems are reviewed and otherwise negative.  Studies Reviewed: Marland Kitchen    EKG:  EKG is ordered today, personally reviewed, demonstrating ***     CV Studies: Cardiac studies reviewed are outlined and summarized above. Otherwise please see EMR for full report. Cardiac Studies & Procedures   ______________________________________________________________________________________________     ECHOCARDIOGRAM  ECHOCARDIOGRAM COMPLETE 12/23/2022  Narrative ECHOCARDIOGRAM REPORT    Patient Name:   [name] Date of Exam: 12/23/2022 Medical Rec #:  [id number]         Height:       60.0 in Accession #:    [id number]        Weight:       80.0 lb Date of Birth:  Jun 19, 1951        BSA:          1.265 m Patient Age:    71 years          BP:           111/64 mmHg Patient Gender: F                 HR:           88 bpm. Exam Location:  Inpatient  Procedure: 2D Echo, Color Doppler and Cardiac Doppler  Indications:    Afib in ER  History:        Patient has no prior history of Echocardiogram examinations. Cardiomyopathy, COPD, Arrythmias:Atrial Fibrillation; Risk Factors:Dyslipidemia.  Sonographer:    Milbert Coulter Referring Phys: [id number] [name]   Sonographer Comments: Suboptimal apical window. Image acquisition challenging due to mastectomy and Image acquisition challenging due to breast  implants. IMPRESSIONS   1. Left ventricular ejection fraction, by estimation, is 60 to 65%. The left ventricle has normal function. The left ventricle has no regional wall motion abnormalities. Left ventricular diastolic parameters are consistent with Grade I diastolic dysfunction (impaired relaxation). 2. Right ventricular systolic function is normal. The right ventricular size is normal. There is normal pulmonary artery systolic pressure. The estimated right ventricular systolic pressure is 32.6 mmHg. 3. The mitral valve is normal in structure. Mild mitral valve regurgitation. No evidence of mitral stenosis. 4. The aortic valve is normal in structure. Aortic valve regurgitation is not visualized. No aortic stenosis is present. 5. Aortic dilatation noted. There is mild dilatation of the aortic root, measuring 40 mm. 6. The inferior vena cava is normal in size with greater than 50% respiratory variability, suggesting right atrial pressure of 3 mmHg.  FINDINGS Left Ventricle: Left ventricular ejection fraction, by estimation, is 60 to 65%. The left ventricle has normal function. The left ventricle has no regional wall motion abnormalities. The left ventricular internal cavity size was normal in size. There is no left ventricular hypertrophy. Left ventricular diastolic parameters are consistent with Grade I diastolic dysfunction (impaired relaxation). Normal left ventricular filling pressure.  Right Ventricle: The right ventricular size is normal.  No increase in right ventricular wall thickness. Right ventricular systolic function is normal. There is normal pulmonary artery systolic pressure. The tricuspid regurgitant velocity is 2.72 m/s, and with an assumed right atrial pressure of 3 mmHg, the estimated right ventricular systolic pressure is 32.6 mmHg.  Left Atrium: Left atrial size was normal in size.  Right Atrium: Right atrial size was normal in size.  Pericardium: Trivial pericardial  effusion is present. The pericardial effusion is anterior to the right ventricle.  Mitral Valve: The mitral valve is normal in structure. Mild mitral valve regurgitation. No evidence of mitral valve stenosis.  Tricuspid Valve: The tricuspid valve is normal in structure. Tricuspid valve regurgitation is mild . No evidence of tricuspid stenosis.  Aortic Valve: The aortic valve is normal in structure. Aortic valve regurgitation is not visualized. No aortic stenosis is present.  Pulmonic Valve: The pulmonic valve was normal in structure. Pulmonic valve regurgitation is not visualized. No evidence of pulmonic stenosis.  Aorta: Aortic dilatation noted. There is mild dilatation of the aortic root, measuring 40 mm.  Venous: The inferior vena cava is normal in size with greater than 50% respiratory variability, suggesting right atrial pressure of 3 mmHg.  IAS/Shunts: No atrial level shunt detected by color flow Doppler.   LEFT VENTRICLE PLAX 2D LVIDd:         3.40 cm   Diastology LVIDs:         1.90 cm   LV e' medial:    8.03 cm/s LV PW:         0.90 cm   LV E/e' medial:  6.1 LV IVS:        0.80 cm   LV e' lateral:   10.30 cm/s LVOT diam:     1.50 cm   LV E/e' lateral: 4.7 LVOT Area:     1.77 cm   RIGHT VENTRICLE RV S prime:     19.70 cm/s TAPSE (M-mode): 2.0 cm  LEFT ATRIUM         Index LA diam:    3.10 cm 2.45 cm/m  AORTA Ao Root diam: 4.00 cm  MITRAL VALVE               TRICUSPID VALVE MV Area (PHT): 3.48 cm    TR Peak grad:   29.6 mmHg MV Decel Time: 218 msec    TR Vmax:        272.00 cm/s MV E velocity: 48.70 cm/s MV A velocity: 77.00 cm/s  SHUNTS MV E/A ratio:  0.63        Systemic Diam: 1.50 cm  Armanda Magic MD Electronically signed by Armanda Magic MD Signature Date/Time: 12/23/2022/2:11:03 PM    Final    MONITORS  LONG TERM MONITOR (3-14 DAYS) 03/17/2023  Narrative The predominant rhythm was normal sinus. There were runs of supraventricular tachycardia  noted with the longest lasting 11 seconds Rare ventricular ectopy.       ______________________________________________________________________________________________       Current Reported Medications:.    No outpatient medications have been marked as taking for the 11/15/23 encounter (Appointment) with Rip Harbour, NP.    Physical Exam:    VS:  There were no vitals taken for this visit.   Wt Readings from Last 3 Encounters:  10/12/23 92 lb (41.7 kg)  10/04/23 92 lb (41.7 kg)  09/16/23 96 lb (43.5 kg)    GEN: Well nourished, well developed in no acute distress NECK: No JVD; No carotid bruits CARDIAC: ***RRR, no  murmurs, rubs, gallops RESPIRATORY:  Clear to auscultation without rales, wheezing or rhonchi  ABDOMEN: Soft, non-tender, non-distended EXTREMITIES:  No edema; No acute deformity     Asessement and Plan:.     ***     Disposition: F/u with ***  Signed, Rip Harbour, NP

## 2023-11-14 ENCOUNTER — Emergency Department (HOSPITAL_COMMUNITY)

## 2023-11-14 ENCOUNTER — Inpatient Hospital Stay (HOSPITAL_COMMUNITY)
Admission: EM | Admit: 2023-11-14 | Discharge: 2023-11-22 | DRG: 291 | Disposition: A | Discharge status: Skilled Nursing Facility | Attending: Internal Medicine | Admitting: Internal Medicine

## 2023-11-14 ENCOUNTER — Other Ambulatory Visit: Payer: Self-pay

## 2023-11-14 ENCOUNTER — Encounter (HOSPITAL_COMMUNITY): Payer: Self-pay

## 2023-11-14 DIAGNOSIS — D649 Anemia, unspecified: Secondary | ICD-10-CM | POA: Diagnosis present

## 2023-11-14 DIAGNOSIS — R2689 Other abnormalities of gait and mobility: Secondary | ICD-10-CM | POA: Diagnosis not present

## 2023-11-14 DIAGNOSIS — M797 Fibromyalgia: Secondary | ICD-10-CM | POA: Diagnosis not present

## 2023-11-14 DIAGNOSIS — W19XXXD Unspecified fall, subsequent encounter: Secondary | ICD-10-CM | POA: Diagnosis present

## 2023-11-14 DIAGNOSIS — E782 Mixed hyperlipidemia: Secondary | ICD-10-CM | POA: Diagnosis not present

## 2023-11-14 DIAGNOSIS — R002 Palpitations: Secondary | ICD-10-CM | POA: Diagnosis present

## 2023-11-14 DIAGNOSIS — I959 Hypotension, unspecified: Secondary | ICD-10-CM | POA: Diagnosis present

## 2023-11-14 DIAGNOSIS — Z803 Family history of malignant neoplasm of breast: Secondary | ICD-10-CM

## 2023-11-14 DIAGNOSIS — I2510 Atherosclerotic heart disease of native coronary artery without angina pectoris: Secondary | ICD-10-CM | POA: Diagnosis present

## 2023-11-14 DIAGNOSIS — M7989 Other specified soft tissue disorders: Secondary | ICD-10-CM | POA: Diagnosis not present

## 2023-11-14 DIAGNOSIS — I4891 Unspecified atrial fibrillation: Secondary | ICD-10-CM | POA: Diagnosis not present

## 2023-11-14 DIAGNOSIS — Z7901 Long term (current) use of anticoagulants: Secondary | ICD-10-CM

## 2023-11-14 DIAGNOSIS — I5043 Acute on chronic combined systolic (congestive) and diastolic (congestive) heart failure: Secondary | ICD-10-CM | POA: Diagnosis present

## 2023-11-14 DIAGNOSIS — I509 Heart failure, unspecified: Secondary | ICD-10-CM | POA: Diagnosis not present

## 2023-11-14 DIAGNOSIS — R935 Abnormal findings on diagnostic imaging of other abdominal regions, including retroperitoneum: Secondary | ICD-10-CM | POA: Diagnosis not present

## 2023-11-14 DIAGNOSIS — Z881 Allergy status to other antibiotic agents status: Secondary | ICD-10-CM

## 2023-11-14 DIAGNOSIS — N12 Tubulo-interstitial nephritis, not specified as acute or chronic: Secondary | ICD-10-CM | POA: Diagnosis not present

## 2023-11-14 DIAGNOSIS — J90 Pleural effusion, not elsewhere classified: Secondary | ICD-10-CM | POA: Diagnosis not present

## 2023-11-14 DIAGNOSIS — Z853 Personal history of malignant neoplasm of breast: Secondary | ICD-10-CM

## 2023-11-14 DIAGNOSIS — E876 Hypokalemia: Secondary | ICD-10-CM | POA: Diagnosis not present

## 2023-11-14 DIAGNOSIS — Z833 Family history of diabetes mellitus: Secondary | ICD-10-CM

## 2023-11-14 DIAGNOSIS — J9811 Atelectasis: Secondary | ICD-10-CM | POA: Diagnosis not present

## 2023-11-14 DIAGNOSIS — I480 Paroxysmal atrial fibrillation: Secondary | ICD-10-CM | POA: Diagnosis not present

## 2023-11-14 DIAGNOSIS — J9611 Chronic respiratory failure with hypoxia: Secondary | ICD-10-CM | POA: Diagnosis not present

## 2023-11-14 DIAGNOSIS — Z888 Allergy status to other drugs, medicaments and biological substances status: Secondary | ICD-10-CM

## 2023-11-14 DIAGNOSIS — E43 Unspecified severe protein-calorie malnutrition: Secondary | ICD-10-CM | POA: Diagnosis not present

## 2023-11-14 DIAGNOSIS — I428 Other cardiomyopathies: Secondary | ICD-10-CM | POA: Diagnosis not present

## 2023-11-14 DIAGNOSIS — Z9981 Dependence on supplemental oxygen: Secondary | ICD-10-CM

## 2023-11-14 DIAGNOSIS — S82841A Displaced bimalleolar fracture of right lower leg, initial encounter for closed fracture: Secondary | ICD-10-CM | POA: Diagnosis present

## 2023-11-14 DIAGNOSIS — Z87891 Personal history of nicotine dependence: Secondary | ICD-10-CM

## 2023-11-14 DIAGNOSIS — Z681 Body mass index (BMI) 19 or less, adult: Secondary | ICD-10-CM

## 2023-11-14 DIAGNOSIS — I4719 Other supraventricular tachycardia: Secondary | ICD-10-CM | POA: Diagnosis not present

## 2023-11-14 DIAGNOSIS — I340 Nonrheumatic mitral (valve) insufficiency: Secondary | ICD-10-CM | POA: Diagnosis present

## 2023-11-14 DIAGNOSIS — J449 Chronic obstructive pulmonary disease, unspecified: Secondary | ICD-10-CM | POA: Diagnosis present

## 2023-11-14 DIAGNOSIS — M810 Age-related osteoporosis without current pathological fracture: Secondary | ICD-10-CM | POA: Diagnosis present

## 2023-11-14 DIAGNOSIS — I110 Hypertensive heart disease with heart failure: Secondary | ICD-10-CM | POA: Diagnosis not present

## 2023-11-14 DIAGNOSIS — Z79899 Other long term (current) drug therapy: Secondary | ICD-10-CM

## 2023-11-14 DIAGNOSIS — S82841D Displaced bimalleolar fracture of right lower leg, subsequent encounter for closed fracture with routine healing: Secondary | ICD-10-CM

## 2023-11-14 DIAGNOSIS — Z9181 History of falling: Secondary | ICD-10-CM | POA: Diagnosis not present

## 2023-11-14 DIAGNOSIS — I4892 Unspecified atrial flutter: Secondary | ICD-10-CM | POA: Diagnosis not present

## 2023-11-14 DIAGNOSIS — R1110 Vomiting, unspecified: Secondary | ICD-10-CM | POA: Diagnosis not present

## 2023-11-14 DIAGNOSIS — J441 Chronic obstructive pulmonary disease with (acute) exacerbation: Secondary | ICD-10-CM | POA: Diagnosis present

## 2023-11-14 DIAGNOSIS — I3139 Other pericardial effusion (noninflammatory): Secondary | ICD-10-CM | POA: Diagnosis present

## 2023-11-14 DIAGNOSIS — R918 Other nonspecific abnormal finding of lung field: Secondary | ICD-10-CM | POA: Diagnosis not present

## 2023-11-14 DIAGNOSIS — Z5941 Food insecurity: Secondary | ICD-10-CM

## 2023-11-14 DIAGNOSIS — J849 Interstitial pulmonary disease, unspecified: Secondary | ICD-10-CM | POA: Diagnosis not present

## 2023-11-14 DIAGNOSIS — R000 Tachycardia, unspecified: Secondary | ICD-10-CM | POA: Diagnosis not present

## 2023-11-14 DIAGNOSIS — R109 Unspecified abdominal pain: Secondary | ICD-10-CM | POA: Diagnosis not present

## 2023-11-14 DIAGNOSIS — R54 Age-related physical debility: Secondary | ICD-10-CM | POA: Diagnosis present

## 2023-11-14 DIAGNOSIS — E538 Deficiency of other specified B group vitamins: Secondary | ICD-10-CM | POA: Diagnosis present

## 2023-11-14 DIAGNOSIS — R0602 Shortness of breath: Secondary | ICD-10-CM

## 2023-11-14 DIAGNOSIS — I5023 Acute on chronic systolic (congestive) heart failure: Secondary | ICD-10-CM | POA: Diagnosis not present

## 2023-11-14 DIAGNOSIS — R531 Weakness: Secondary | ICD-10-CM | POA: Diagnosis not present

## 2023-11-14 DIAGNOSIS — R262 Difficulty in walking, not elsewhere classified: Secondary | ICD-10-CM | POA: Diagnosis not present

## 2023-11-14 DIAGNOSIS — I499 Cardiac arrhythmia, unspecified: Secondary | ICD-10-CM | POA: Diagnosis not present

## 2023-11-14 DIAGNOSIS — R1310 Dysphagia, unspecified: Secondary | ICD-10-CM | POA: Diagnosis not present

## 2023-11-14 DIAGNOSIS — Z604 Social exclusion and rejection: Secondary | ICD-10-CM | POA: Diagnosis present

## 2023-11-14 DIAGNOSIS — I77810 Thoracic aortic ectasia: Secondary | ICD-10-CM | POA: Diagnosis present

## 2023-11-14 DIAGNOSIS — Z90710 Acquired absence of both cervix and uterus: Secondary | ICD-10-CM

## 2023-11-14 DIAGNOSIS — Z880 Allergy status to penicillin: Secondary | ICD-10-CM

## 2023-11-14 DIAGNOSIS — E785 Hyperlipidemia, unspecified: Secondary | ICD-10-CM | POA: Diagnosis not present

## 2023-11-14 DIAGNOSIS — K219 Gastro-esophageal reflux disease without esophagitis: Secondary | ICD-10-CM | POA: Diagnosis present

## 2023-11-14 DIAGNOSIS — Z818 Family history of other mental and behavioral disorders: Secondary | ICD-10-CM

## 2023-11-14 DIAGNOSIS — M6281 Muscle weakness (generalized): Secondary | ICD-10-CM | POA: Diagnosis not present

## 2023-11-14 DIAGNOSIS — Z743 Need for continuous supervision: Secondary | ICD-10-CM | POA: Diagnosis not present

## 2023-11-14 DIAGNOSIS — R6889 Other general symptoms and signs: Secondary | ICD-10-CM | POA: Diagnosis not present

## 2023-11-14 DIAGNOSIS — K589 Irritable bowel syndrome without diarrhea: Secondary | ICD-10-CM | POA: Diagnosis present

## 2023-11-14 DIAGNOSIS — Z8673 Personal history of transient ischemic attack (TIA), and cerebral infarction without residual deficits: Secondary | ICD-10-CM

## 2023-11-14 DIAGNOSIS — Z8249 Family history of ischemic heart disease and other diseases of the circulatory system: Secondary | ICD-10-CM

## 2023-11-14 DIAGNOSIS — I491 Atrial premature depolarization: Secondary | ICD-10-CM | POA: Diagnosis present

## 2023-11-14 DIAGNOSIS — Z981 Arthrodesis status: Secondary | ICD-10-CM | POA: Diagnosis not present

## 2023-11-14 DIAGNOSIS — Z800 Family history of malignant neoplasm of digestive organs: Secondary | ICD-10-CM

## 2023-11-14 DIAGNOSIS — G43909 Migraine, unspecified, not intractable, without status migrainosus: Secondary | ICD-10-CM | POA: Diagnosis present

## 2023-11-14 DIAGNOSIS — Z9049 Acquired absence of other specified parts of digestive tract: Secondary | ICD-10-CM | POA: Diagnosis not present

## 2023-11-14 DIAGNOSIS — Z7401 Bed confinement status: Secondary | ICD-10-CM | POA: Diagnosis not present

## 2023-11-14 DIAGNOSIS — Z9010 Acquired absence of unspecified breast and nipple: Secondary | ICD-10-CM

## 2023-11-14 LAB — BRAIN NATRIURETIC PEPTIDE: B Natriuretic Peptide: 891.7 pg/mL — ABNORMAL HIGH (ref 0.0–100.0)

## 2023-11-14 LAB — CBC
HCT: 30.5 % — ABNORMAL LOW (ref 36.0–46.0)
Hemoglobin: 9.8 g/dL — ABNORMAL LOW (ref 12.0–15.0)
MCH: 29.4 pg (ref 26.0–34.0)
MCHC: 32.1 g/dL (ref 30.0–36.0)
MCV: 91.6 fL (ref 80.0–100.0)
Platelets: 557 10*3/uL — ABNORMAL HIGH (ref 150–400)
RBC: 3.33 MIL/uL — ABNORMAL LOW (ref 3.87–5.11)
RDW: 14 % (ref 11.5–15.5)
WBC: 9.7 10*3/uL (ref 4.0–10.5)
nRBC: 0 % (ref 0.0–0.2)

## 2023-11-14 LAB — BASIC METABOLIC PANEL
Anion gap: 12 (ref 5–15)
BUN: 7 mg/dL — ABNORMAL LOW (ref 8–23)
CO2: 30 mmol/L (ref 22–32)
Calcium: 8.7 mg/dL — ABNORMAL LOW (ref 8.9–10.3)
Chloride: 96 mmol/L — ABNORMAL LOW (ref 98–111)
Creatinine, Ser: 0.62 mg/dL (ref 0.44–1.00)
GFR, Estimated: >60 mL/min (ref >60)
Glucose, Bld: 122 mg/dL — ABNORMAL HIGH (ref 70–99)
Potassium: 4 mmol/L (ref 3.5–5.1)
Sodium: 138 mmol/L (ref 135–145)

## 2023-11-14 LAB — TSH: TSH: 2.991 u[IU]/mL (ref 0.350–4.500)

## 2023-11-14 LAB — D-DIMER, QUANTITATIVE: D-Dimer, Quant: 4.03 ug{FEU}/mL — ABNORMAL HIGH (ref 0.00–0.50)

## 2023-11-14 LAB — MAGNESIUM: Magnesium: 1.9 mg/dL (ref 1.7–2.4)

## 2023-11-14 MED ORDER — DILTIAZEM HCL-DEXTROSE 125-5 MG/125ML-% IV SOLN (PREMIX)
5.0000 mg/h | INTRAVENOUS | Status: DC
  Administered 2023-11-14: 5 mg/h via INTRAVENOUS
  Administered 2023-11-15: 10 mg/h via INTRAVENOUS
  Administered 2023-11-15: 15 mg/h via INTRAVENOUS
  Filled 2023-11-14 (×3): qty 125

## 2023-11-14 MED ORDER — PANTOPRAZOLE SODIUM 40 MG PO TBEC
40.0000 mg | DELAYED_RELEASE_TABLET | Freq: Every day | ORAL | Status: DC
  Administered 2023-11-14 – 2023-11-22 (×9): 40 mg via ORAL
  Filled 2023-11-14 (×10): qty 1

## 2023-11-14 MED ORDER — SODIUM CHLORIDE 0.9 % IV SOLN
500.0000 mg | Freq: Once | INTRAVENOUS | Status: AC
  Administered 2023-11-14: 500 mg via INTRAVENOUS
  Filled 2023-11-14: qty 5

## 2023-11-14 MED ORDER — CEFTRIAXONE SODIUM 2 G IJ SOLR
2.0000 g | Freq: Once | INTRAMUSCULAR | Status: AC
  Administered 2023-11-14: 2 g via INTRAVENOUS
  Filled 2023-11-14: qty 20

## 2023-11-14 MED ORDER — CALCIUM GLUCONATE-NACL 1-0.675 GM/50ML-% IV SOLN
1.0000 g | Freq: Once | INTRAVENOUS | Status: AC
  Administered 2023-11-14: 1000 mg via INTRAVENOUS
  Filled 2023-11-14: qty 50

## 2023-11-14 MED ORDER — ACETAMINOPHEN 325 MG PO TABS
650.0000 mg | ORAL_TABLET | Freq: Four times a day (QID) | ORAL | Status: DC | PRN
  Administered 2023-11-15 – 2023-11-19 (×2): 650 mg via ORAL
  Filled 2023-11-14 (×2): qty 2

## 2023-11-14 MED ORDER — FUROSEMIDE 10 MG/ML IJ SOLN
40.0000 mg | Freq: Once | INTRAMUSCULAR | Status: AC
  Administered 2023-11-14: 40 mg via INTRAVENOUS
  Filled 2023-11-14: qty 4

## 2023-11-14 MED ORDER — PROMETHAZINE HCL 12.5 MG PO TABS
12.5000 mg | ORAL_TABLET | Freq: Four times a day (QID) | ORAL | Status: DC | PRN
  Administered 2023-11-15 – 2023-11-22 (×6): 12.5 mg via ORAL
  Filled 2023-11-14 (×7): qty 1

## 2023-11-14 MED ORDER — SODIUM CHLORIDE 0.9 % IV BOLUS
500.0000 mL | Freq: Once | INTRAVENOUS | Status: AC
  Administered 2023-11-14: 500 mL via INTRAVENOUS

## 2023-11-14 MED ORDER — ACETAMINOPHEN 650 MG RE SUPP: 650.0000 mg | Freq: Four times a day (QID) | RECTAL | Status: DC | PRN

## 2023-11-14 MED ORDER — IOHEXOL 350 MG/ML SOLN
50.0000 mL | Freq: Once | INTRAVENOUS | Status: AC | PRN
Start: 2023-11-14 — End: 2023-11-14
  Administered 2023-11-14: 50 mL via INTRAVENOUS

## 2023-11-14 MED ORDER — LEVALBUTEROL HCL 0.63 MG/3ML IN NEBU: 0.6300 mg | INHALATION_SOLUTION | Freq: Four times a day (QID) | RESPIRATORY_TRACT | Status: DC | PRN

## 2023-11-14 MED ORDER — HEPARIN SODIUM (PORCINE) 5000 UNIT/ML IJ SOLN
5000.0000 [IU] | Freq: Three times a day (TID) | INTRAMUSCULAR | Status: DC
  Administered 2023-11-14 – 2023-11-15 (×3): 5000 [IU] via SUBCUTANEOUS
  Filled 2023-11-14 (×3): qty 1

## 2023-11-14 MED ORDER — DILTIAZEM LOAD VIA INFUSION
10.0000 mg | Freq: Once | INTRAVENOUS | Status: AC
  Administered 2023-11-14: 10 mg via INTRAVENOUS
  Filled 2023-11-14: qty 10

## 2023-11-14 MED ORDER — FUROSEMIDE 10 MG/ML IJ SOLN
40.0000 mg | Freq: Two times a day (BID) | INTRAMUSCULAR | Status: DC
  Administered 2023-11-15 (×2): 40 mg via INTRAVENOUS
  Filled 2023-11-14 (×4): qty 4

## 2023-11-14 NOTE — ED Provider Notes (Addendum)
 Patient care transferred to me.  CTA is negative for PE.  Does show pleural effusions and so she will be given some Lasix as she was having a lot of trouble laying flat for the scanner.  A-fib seems to have resolved and now patient is in a sinus rhythm.  Will admit to the hospital service, consulted with Dr. Maisie Fus.Pricilla Loveless, MD 11/14/23 [phone]  The CT did show some free fluid in the abdomen and so a CT abdomen pelvis will be added on. She has some mild tenderness, no peritonitis.   Pricilla Loveless, MD 11/14/23 2043

## 2023-11-14 NOTE — ED Notes (Addendum)
 Per EDP diltiazem drip upped to 10 mg/hr

## 2023-11-14 NOTE — Progress Notes (Signed)
 VASCULAR LAB    Right lower extremity venous duplex has been performed.  See CV proc for preliminary results.  Relayed results to Dr. Eloise Harman via secure chat  Sherren Kerns, RVT 11/14/2023, 2:53 PM

## 2023-11-14 NOTE — ED Triage Notes (Signed)
 Pt BIB [facility] from home with [facility] and palpitations x2 days. Per EMS pt takes cardizem at home. Pulse between 190-220 per EMS, gave 20mg  cardizem total en route, HR lowered to 140. BP 114/72 per EMS. Pt 99% on 3L [place].

## 2023-11-14 NOTE — H&P (Incomplete)
 History and Physical    MYYA MEENACH ZOX:[id number] DOB: 07-22-1951 DOA: 11/14/2023  PCP: Melida Quitter, PA  Patient coming from: home  I have personally briefly reviewed patient's old medical records in [facility]  Chief Complaint:  sob x 2 days  HPI: [name] is a 73 y.o. female with medical history significant of   h/o NICM following treatment of breast cancer, chronic HFrEF with recovery with last EF of 60-65%, COPD on chronic oxygen, mitral regurgitaiton, SVT as noted Zio 06/2023, GERD, HLD, IBS, osteoporosis, recent interim history of admission for repair for bimalleolar ankle fracture after a fall. Patient s/p surgical repair was discharged to SNF. At SNF patient while working with PT was noted to have increase hr to 130's. At that time metoprolol was added to her home regimen of cardizem. Patient now discharged from rehab, presented to ED with 2 days of increasing sob. Patient notes no associated fever/chills/ n/v/d/ or chest pain.  ED Course:  IN ED patient was found to have  afib with rvr/ectopic atrial tachycardia as well as CHF with noted b/l pleural effusion and hypoxemia. Patient admitted for further evaluation.  Vitals: Temp 99.3, bp 119/79, hr 158, rr 16 sat 100% ON 2l WBC 9.7, HGB 9.8 ( 10),PLT557 D-DIMER 4.03 nA 138, k 4, cl96, GLU 122, CR 0.62,  <AG 1.9 TSH 2.99 BNP 891.7  EKG: Afib with RVR at 150 , repeat ectopic atrial rythm  Treatment NS 1 gram calcium gluconate Cardizem drip   Review of Systems: As per HPI otherwise 10 point review of systems negative.   Past Medical History:  Diagnosis Date   Anemia    B12 deficient   Arthritis    B12 deficiency    Breast cancer (HCC)    left breast.   Chronic SI joint pain    Bilateral, Dr. Corliss Skains   COPD (chronic obstructive pulmonary disease) (HCC)    Fibromyalgia    Dr. Corliss Skains   GERD (gastroesophageal reflux disease)    Greater trochanteric bursitis of both hips    Dr.  Corliss Skains   Hyperlipidemia    IBS (irritable bowel syndrome)    LV dysfunction    iatrogenic from chemotherapy ; on Carvedilol    Migraines    Osteoporosis    Dr Lomax---> transferring to a new gyn   Oxygen dependent    3 liters    Past Surgical History:  Procedure Laterality Date   ABDOMINAL HYSTERECTOMY     BSO for  Endometriosis   CARPAL TUNNEL RELEASE     left   CERVICAL DISC SURGERY     COLONOSCOPY W/ POLYPECTOMY  1999   Dr Kinnie Scales   ESOPHAGEAL DILATION  2005   Dr Kinnie Scales   MASTECTOMY  2009   bilateral, Dr Jamey Ripa   ORIF ANKLE FRACTURE Right 10/12/2023   Procedure: RIGHT OPEN REDUCTION INTERNAL FIXATION (ORIF) ANKLE FRACTURE;  Surgeon: Huel Cote, MD;  Location: [facility] OR;  Service: Orthopedics;  Laterality: Right;   SEPTOPLASTY       reports that she quit smoking about 33 years ago. Her smoking use included cigarettes. She started smoking about 63 years ago. She has a 30 pack-year smoking history. She has never been exposed to tobacco smoke. She has never used smokeless tobacco. She reports that she does not currently use drugs after having used the following drugs: Marijuana. She reports that she does not drink alcohol.  Allergies  Allergen Reactions   Amoxicillin-Pot Clavulanate  diarrhea   Doxycycline     Nausea & vomiting   Venlafaxine     ? Reaction; ? Blurred vision    Family History  Problem Relation Age of Onset   Diabetes Mother    Breast cancer Mother    Depression Mother        anxiety   Heart disease Mother        in her 81s   Colon cancer Sister    Healthy Daughter    Stroke Neg Hx     Prior to Admission medications   Medication Sig Start Date End Date Taking? Authorizing Provider  albuterol (VENTOLIN HFA) 108 (90 Base) MCG/ACT inhaler Inhale 2 puffs every 6 hours as needed 02/19/23  Yes Young, Clinton D, MD  hydrOXYzine (ATARAX/VISTARIL) 10 MG tablet Take 10 mg by mouth every 6 (six) hours as needed for anxiety. 05/31/20  Yes [provider]  metoprolol tartrate (LOPRESSOR) 25 MG tablet Take 25 mg by mouth daily. 11/04/23  Yes [provider]  ondansetron (ZOFRAN-ODT) 4 MG disintegrating tablet Take 4 mg by mouth every 8 (eight) hours as needed. 11/09/23  Yes [provider]  pantoprazole (PROTONIX) 20 MG tablet Take 20 mg by mouth daily. 11/09/23  Yes [provider]  Calcium Carbonate (CALTRATE 600 PO) Take 600 mg by mouth daily. Vit d3    [provider]  citalopram (CELEXA) 40 MG tablet Take 40 mg by mouth daily.    Amematsro, Fred, NP  diltiazem (CARDIZEM CD) 120 MG 24 hr capsule Take 1 capsule (120 mg total) by mouth daily. 02/12/23   Dunn, Tacey Ruiz, PA-C  Magnesium 300 MG CAPS Take 300 mg by mouth daily.    [provider]  methocarbamol (ROBAXIN) 750 MG tablet TAKE 1 TABLET BY MOUTH 2 TIMES DAILY AS NEEDED FOR MUSCLE SPASMS (NON FORMULARY) 10/04/23   Pollyann Savoy, MD  Multiple Vitamins-Minerals (MULTIVITAMIN WITH MINERALS) tablet Take 1 tablet by mouth daily.    [provider]  oxyCODONE (ROXICODONE) 5 MG immediate release tablet Take 1 tablet (5 mg total) by mouth every 4 (four) hours as needed for severe pain (pain score 7-10) or breakthrough pain. 10/15/23   Huel Cote, MD  umeclidinium bromide (INCRUSE ELLIPTA) 62.5 MCG/ACT AEPB Inhale 1 puff into the lungs daily. 02/19/23   Jetty Duhamel D, MD  zinc gluconate 50 MG tablet Take 50 mg by mouth daily.    [provider]    Physical Exam: Vitals:   11/14/23 1700 11/14/23 1745 11/14/23 1800 11/14/23 1815  BP: 125/82 116/75 115/76 114/79  Pulse: (!) 118 95 98 95  Resp: (!) 29 (!) 27 20 19   Temp:      TempSrc:      SpO2: 100% 100% 100% 100%  Weight:      Height:        Constitutional: NAD, calm, comfortable Vitals:   11/14/23 1700 11/14/23 1745 11/14/23 1800 11/14/23 1815  BP: 125/82 116/75 115/76 114/79  Pulse: (!) 118 95 98 95  Resp: (!) 29 (!) 27 20 19   Temp:      TempSrc:       SpO2: 100% 100% 100% 100%  Weight:      Height:       Eyes: PERRL, lids and conjunctivae normal ENMT: Mucous membranes are moist. Posterior pharynx clear of any exudate or lesions.Normal dentition.  Neck: normal, supple, no masses, no thyromegaly Respiratory: clear to auscultation bilaterally, no wheezing, no crackles. Normal respiratory effort.  No accessory muscle use.  Cardiovascular: Regular rate and rhythm, no murmurs / rubs / gallops. No extremity edema. 2+ pedal pulses. No carotid bruits.  Abdomen: no tenderness, no masses palpated. No hepatosplenomegaly. Bowel sounds positive.  Musculoskeletal: no clubbing / cyanosis. No joint deformity upper and lower extremities. Good ROM, no contractures. Normal muscle tone.  Skin: no rashes, lesions, ulcers. No induration Neurologic: CN 2-12 grossly intact. Sensation intact, DTR normal. Strength 5/5 in all 4.  Psychiatric: Normal judgment and insight. Alert and oriented x 3. Normal mood.    Labs on Admission: I have personally reviewed following labs and imaging studies  CBC: Recent Labs  Lab 11/14/23 1250  WBC 9.7  HGB 9.8*  HCT 30.5*  MCV 91.6  PLT 557*   Basic Metabolic Panel: Recent Labs  Lab 11/14/23 1250  NA 138  K 4.0  CL 96*  CO2 30  GLUCOSE 122*  BUN 7*  CREATININE 0.62  CALCIUM 8.7*  MG 1.9   GFR: Estimated Creatinine Clearance: 41.2 mL/min (by C-G formula based on SCr of 0.62 mg/dL). Liver Function Tests: No results for input(s): "AST", "ALT", "ALKPHOS", "BILITOT", "PROT", "ALBUMIN" in the last 168 hours. No results for input(s): "LIPASE", "AMYLASE" in the last 168 hours. No results for input(s): "AMMONIA" in the last 168 hours. Coagulation Profile: No results for input(s): "INR", "PROTIME" in the last 168 hours. Cardiac Enzymes: No results for input(s): "CKTOTAL", "CKMB", "CKMBINDEX", "TROPONINI" in the last 168 hours. BNP (last 3 results) No results for input(s): "PROBNP" in the last 8760  hours. HbA1C: No results for input(s): "HGBA1C" in the last 72 hours. CBG: No results for input(s): "GLUCAP" in the last 168 hours. Lipid Profile: No results for input(s): "CHOL", "HDL", "LDLCALC", "TRIG", "CHOLHDL", "LDLDIRECT" in the last 72 hours. Thyroid Function Tests: Recent Labs    11/14/23 1250  TSH 2.991   Anemia Panel: No results for input(s): "VITAMINB12", "FOLATE", "FERRITIN", "TIBC", "IRON", "RETICCTPCT" in the last 72 hours. Urine analysis:    Component Value Date/Time   COLORURINE STRAW (A) 09/20/2017 1439   APPEARANCEUR CLEAR 09/20/2017 1439   LABSPEC 1.006 09/20/2017 1439   PHURINE 5.0 09/20/2017 1439   GLUCOSEU 50 (A) 09/20/2017 1439   GLUCOSEU NEGATIVE 08/12/2006 0922   HGBUR MODERATE (A) 09/20/2017 1439   BILIRUBINUR NEGATIVE 09/20/2017 1439   BILIRUBINUR negative 09/09/2011 1535   KETONESUR NEGATIVE 09/20/2017 1439   PROTEINUR NEGATIVE 09/20/2017 1439   UROBILINOGEN 0.2 09/09/2011 1535   UROBILINOGEN 0.2 08/03/2008 1405   NITRITE NEGATIVE 09/20/2017 1439   LEUKOCYTESUR LARGE (A) 09/20/2017 1439    Radiological Exams on Admission: CT Angio Chest PE W and/or Wo Contrast Result Date: 11/14/2023 CLINICAL DATA:  Shortness of breath.  PE suspected EXAM: CT ANGIOGRAPHY CHEST WITH CONTRAST TECHNIQUE: Multidetector CT imaging of the chest was performed using the standard protocol during bolus administration of intravenous contrast. Multiplanar CT image reconstructions and MIPs were obtained to evaluate the vascular anatomy. RADIATION DOSE REDUCTION: This exam was performed according to the departmental dose-optimization program which includes automated exposure control, adjustment of the mA and/or kV according to patient size and/or use of iterative reconstruction technique. CONTRAST:  50mL OMNIPAQUE IOHEXOL 350 MG/ML SOLN COMPARISON:  Same day chest radiograph and PET/CT 12/27/1998 FINDINGS: Cardiovascular: Cardiomegaly. Small pericardial effusion. Negative for acute  pulmonary embolism. Aortic and coronary artery atherosclerotic calcification. Mediastinum/Nodes: Trachea and esophagus are unremarkable. Shotty mediastinal lymph nodes are likely reactive. Lungs/Pleura: Respiratory motion obscures detail. Emphysema. Small right-greater-than-left pleural effusions and compressive atelectasis.  Mild diffuse bronchial wall thickening. Question interlobular septal thickening though evaluation is limited by motion artifact. No pneumothorax. Upper Abdomen: Low-density free fluid in the left upper quadrant. Musculoskeletal: No acute fracture. Postoperative changes both breasts. Review of the MIP images confirms the above findings. IMPRESSION: 1. Negative for acute pulmonary embolism. 2. Question interstitial pulmonary edema. Small right-greater-than-left pleural effusions and compressive atelectasis. 3. Cardiomegaly with small pericardial effusion. 4. Low-density free fluid in the left upper quadrant. CT abdomen pelvis with IV contrast is recommended for further evaluation. Aortic Atherosclerosis (ICD10-I70.0) and Emphysema (ICD10-J43.9). Electronically Signed   By: Minerva Fester M.D.   On: 11/14/2023 19:08   VAS [place] LOWER EXTREMITY VENOUS (DVT) (ONLY [facility] & [facility]) Result Date: 11/14/2023  Lower Venous DVT Study Patient Name:  IARA MONDS  Date of Exam:   11/14/2023 Medical Rec #: [id number]          Accession #:    [id number] Date of Birth: [birth year]         Patient Gender: F Patient Age:   61 years Exam Location:  [facility] Procedure:      VAS [place] LOWER EXTREMITY VENOUS (DVT) Referring Phys: Molly Maduro PATERSON --------------------------------------------------------------------------------  Indications: Edema, and Patient tachycardic on arrival with pulse between 190-220, now post Cardizem rate decreased to 140s-160s..  Risk Factors: Right ORIF of ankle 10/12/23. Limitations: Adult undergarment shadowing in bilateral groins Comparison Study: No prior study on file Performing  Technologist: Sherren Kerns RVS  Examination Guidelines: A complete evaluation includes B-mode imaging, spectral Doppler, color Doppler, and power Doppler as needed of all accessible portions of each vessel. Bilateral testing is considered an integral part of a complete examination. Limited examinations for reoccurring indications may be performed as noted. The reflux portion of the exam is performed with the patient in reverse Trendelenburg.  +---------+---------------+---------+-----------+----------+--------------+ RIGHT    CompressibilityPhasicitySpontaneityPropertiesThrombus Aging +---------+---------------+---------+-----------+----------+--------------+ CFV      Full           Yes      No                                  +---------+---------------+---------+-----------+----------+--------------+ SFJ      Full                                                        +---------+---------------+---------+-----------+----------+--------------+ FV Prox  Full                                                        +---------+---------------+---------+-----------+----------+--------------+ FV Mid   Full                                                        +---------+---------------+---------+-----------+----------+--------------+ FV DistalFull                                                        +---------+---------------+---------+-----------+----------+--------------+  PFV      Full                                                        +---------+---------------+---------+-----------+----------+--------------+ POP      Full           No       Yes                                 +---------+---------------+---------+-----------+----------+--------------+ PTV      Full                                                        +---------+---------------+---------+-----------+----------+--------------+ PERO     Full                                                         +---------+---------------+---------+-----------+----------+--------------+   +----+---------------+---------+-----------+----------+--------------+ LEFTCompressibilityPhasicitySpontaneityPropertiesThrombus Aging +----+---------------+---------+-----------+----------+--------------+ CFV Full           No       Yes                                 +----+---------------+---------+-----------+----------+--------------+ SFJ Full                                                        +----+---------------+---------+-----------+----------+--------------+    Summary: RIGHT: - There is no evidence of deep vein thrombosis in the lower extremity.  - No cystic structure found in the popliteal fossa. interstitial edema noted throughout  LEFT: - No evidence of common femoral vein obstruction.   *See table(s) above for measurements and observations.    Preliminary    DG Chest Portable 1 View Result Date: 11/14/2023 CLINICAL DATA:  Short of breath. EXAM: PORTABLE CHEST 1 VIEW COMPARISON:  12/22/2022. FINDINGS: Bilateral lung base opacities obscure the hemidiaphragms. Mid and upper lungs are clear. Cardiac silhouette is normal in size. No mediastinal or hilar masses. No pneumothorax.  Suspect small bilateral effusions. Stable changes from prior breast surgery. Skeletal structures are grossly intact. IMPRESSION: 1. Bilateral lung base opacities new since the prior chest radiographs, suspected to be a combination of atelectasis/pneumonia and small bilateral effusions. No evidence of pulmonary edema. Electronically Signed   By: Amie Portland M.D.   On: 11/14/2023 13:07    EKG: Independently reviewed.see above   Assessment/Plan  Atrial tachycardia r/o Afib with RVR vs Multifocal Atrial tachycardia  -hx of SVT , had zio x 2 in fall 2024 : notes svt  no afib  -admit to progressive are  -continue on diltiazem drip , wean as able and resume cardizem -  resume metoprolol  -echo in am   -cardiology consult   Acute  on Chronic CHFref with recovered EF -h/o NICM following treatment of breast cancer, chronic HFrEF with recovery with last EF of 60-65%, - due to persistent tachycardia -lasix 40 mg iv bid  -monitor renal function   COPD on chronic oxygen -resume controller medication  -prn nebs   mitral regurgitation -stable on last echo   GERD -ppi   HLD -cont statin    IBS -no active issues   Osteoporosis S/p recent repair for bimalleolar ankle fracture after a fall -Pt/OT -supportive care     DVT prophylaxis: heparin Code Status:full/ as discussed per patient wishes in event of cardiac arrest  Family Communication: none at bedside Disposition Plan: patient  expected to be admitted greater than 2 midnights  Consults called: cardiology Admission status: progressive care   Lurline Del MD [facility]   If 7PM-7AM, please contact night-coverage [email or web address] Password [facility]  11/14/2023, 7:42 PM

## 2023-11-14 NOTE — ED Notes (Signed)
 Per EDP diltiazem drip upped to 7.5 mg/hr

## 2023-11-14 NOTE — ED Provider Notes (Incomplete)
 [facility] DEPARTMENT AT [facility] Provider Note   CSN: [id number] Arrival date & time: 11/14/23  1229     History {Add pertinent medical, surgical, social history, OB history to HPI:1} Chief Complaint  Patient presents with   Shortness of Breath   Palpitations    [name] is a 73 y.o. female.  73 year old female with a history of reported atrial fibrillation on diltiazem, COPD, breast cancer, left ankle ORIF on 10/12/2023, and ILD on 3 L home oxygen who presents emergency department with palpitations.  Says that for the past few days she has been having palpitations.  Also has developed some mild shortness of breath.  Not on blood thinners.  Has been taking diltiazem for her atrial fibrillation.  When EMS arrived she was tachycardic into the 150s and 160s and was given diltiazem 10 mg x 2.  Has been persistently tachycardic.  Says she is also had a dry cough.  No fevers.  No chest pain.         Home Medications Prior to Admission medications   Medication Sig Start Date End Date Taking? Authorizing Provider  albuterol (VENTOLIN HFA) 108 (90 Base) MCG/ACT inhaler Inhale 2 puffs every 6 hours as needed 02/19/23   Jetty Duhamel D, MD  Calcium Carbonate (CALTRATE 600 PO) Take 600 mg by mouth daily. Vit d3    [provider]  citalopram (CELEXA) 40 MG tablet Take 40 mg by mouth daily.    Amematsro, Fred, NP  diltiazem (CARDIZEM CD) 120 MG 24 hr capsule Take 1 capsule (120 mg total) by mouth daily. 02/12/23   Dunn, Tacey Ruiz, PA-C  hydrOXYzine (ATARAX/VISTARIL) 10 MG tablet Take 10 mg by mouth every 6 (six) hours as needed for anxiety. 05/31/20   [provider]  Magnesium 300 MG CAPS Take 300 mg by mouth daily.    [provider]  methocarbamol (ROBAXIN) 750 MG tablet TAKE 1 TABLET BY MOUTH 2 TIMES DAILY AS NEEDED FOR MUSCLE SPASMS (NON FORMULARY) 10/04/23   Pollyann Savoy, MD  Multiple Vitamins-Minerals (MULTIVITAMIN WITH MINERALS)  tablet Take 1 tablet by mouth daily.    [provider]  oxyCODONE (ROXICODONE) 5 MG immediate release tablet Take 1 tablet (5 mg total) by mouth every 4 (four) hours as needed for severe pain (pain score 7-10) or breakthrough pain. 10/15/23   Huel Cote, MD  umeclidinium bromide (INCRUSE ELLIPTA) 62.5 MCG/ACT AEPB Inhale 1 puff into the lungs daily. 02/19/23   Jetty Duhamel D, MD  zinc gluconate 50 MG tablet Take 50 mg by mouth daily.    [provider]      Allergies    Amoxicillin-pot clavulanate, Doxycycline, and Venlafaxine    Review of Systems   Review of Systems  Physical Exam Updated Vital Signs BP 119/79 (BP Location: Left Arm)   Pulse (!) 158   Temp 99.3 F (37.4 C) (Oral)   Resp 16   Ht 5' (1.524 m)   Wt 41.7 kg   SpO2 100%   BMI 17.97 kg/m  Physical Exam Vitals and nursing note reviewed.  Constitutional:      General: She is not in acute distress.    Appearance: She is well-developed.  HENT:     Head: Normocephalic and atraumatic.     Right Ear: External ear normal.     Left Ear: External ear normal.     Nose: Nose normal.  Eyes:     Extraocular Movements: Extraocular movements intact.  Conjunctiva/sclera: Conjunctivae normal.     Pupils: Pupils are equal, round, and reactive to light.  Cardiovascular:     Rate and Rhythm: Tachycardia present. Rhythm irregular.     Heart sounds: No murmur heard. Pulmonary:     Effort: Pulmonary effort is normal. No respiratory distress.     Breath sounds: Rales (Bibasilar) present.  Musculoskeletal:     Cervical back: Normal range of motion and neck supple.     Right lower leg: No edema.     Left lower leg: No edema.  Skin:    General: Skin is warm and dry.  Neurological:     Mental Status: She is alert and oriented to person, place, and time. Mental status is at baseline.  Psychiatric:        Mood and Affect: Mood normal.     ED Results / Procedures / Treatments   Labs (all labs ordered  are listed, but only abnormal results are displayed) Labs Reviewed - No data to display  EKG None  Radiology No results found.  Procedures Procedures  {Document cardiac monitor, telemetry assessment procedure when appropriate:1}  Medications Ordered in ED Medications - No data to display  ED Course/ Medical Decision Making/ A&P   {   Click here for ABCD2, HEART and other calculatorsREFRESH Note before signing :1}                              Medical Decision Making Amount and/or Complexity of Data Reviewed Labs: ordered. Radiology: ordered.  Risk Prescription drug management.   ***  {Document critical care time when appropriate:1} {Document review of labs and clinical decision tools ie heart score, Chads2Vasc2 etc:1}  {Document your independent review of radiology images, and any outside records:1} {Document your discussion with family members, caretakers, and with consultants:1} {Document social determinants of health affecting pt's care:1} {Document your decision making why or why not admission, treatments were needed:1} Final Clinical Impression(s) / ED Diagnoses Final diagnoses:  None    Rx / DC Orders ED Discharge Orders     None

## 2023-11-15 ENCOUNTER — Inpatient Hospital Stay (HOSPITAL_COMMUNITY)

## 2023-11-15 ENCOUNTER — Ambulatory Visit: Admitting: Cardiology

## 2023-11-15 DIAGNOSIS — I509 Heart failure, unspecified: Secondary | ICD-10-CM | POA: Diagnosis not present

## 2023-11-15 DIAGNOSIS — E785 Hyperlipidemia, unspecified: Secondary | ICD-10-CM

## 2023-11-15 DIAGNOSIS — I428 Other cardiomyopathies: Secondary | ICD-10-CM

## 2023-11-15 DIAGNOSIS — I4719 Other supraventricular tachycardia: Secondary | ICD-10-CM | POA: Diagnosis not present

## 2023-11-15 DIAGNOSIS — I4892 Unspecified atrial flutter: Secondary | ICD-10-CM

## 2023-11-15 DIAGNOSIS — I4891 Unspecified atrial fibrillation: Secondary | ICD-10-CM

## 2023-11-15 DIAGNOSIS — I340 Nonrheumatic mitral (valve) insufficiency: Secondary | ICD-10-CM

## 2023-11-15 DIAGNOSIS — R935 Abnormal findings on diagnostic imaging of other abdominal regions, including retroperitoneum: Secondary | ICD-10-CM

## 2023-11-15 DIAGNOSIS — I5023 Acute on chronic systolic (congestive) heart failure: Secondary | ICD-10-CM

## 2023-11-15 LAB — ECHOCARDIOGRAM COMPLETE
AR max vel: 2.54 cm2
AV Peak grad: 7.8 mmHg
Ao pk vel: 1.4 m/s
Area-P 1/2: 4.79 cm2
Height: 60 in
MV M vel: 5.37 m/s
MV Peak grad: 115.3 mmHg
MV VTI: 2.55 cm2
Radius: 0.8 cm
S' Lateral: 3 cm
Weight: 1472 [oz_av]

## 2023-11-15 LAB — CBC
HCT: 29.6 % — ABNORMAL LOW (ref 36.0–46.0)
HCT: 28.5 % — ABNORMAL LOW (ref 36.0–46.0)
Hemoglobin: 9.3 g/dL — ABNORMAL LOW (ref 12.0–15.0)
Hemoglobin: 9 g/dL — ABNORMAL LOW (ref 12.0–15.0)
MCH: 29 pg (ref 26.0–34.0)
MCH: 28.8 pg (ref 26.0–34.0)
MCHC: 31.4 g/dL (ref 30.0–36.0)
MCHC: 31.6 g/dL (ref 30.0–36.0)
MCV: 92.2 fL (ref 80.0–100.0)
MCV: 91.1 fL (ref 80.0–100.0)
Platelets: 475 10*3/uL — ABNORMAL HIGH (ref 150–400)
Platelets: 575 10*3/uL — ABNORMAL HIGH (ref 150–400)
RBC: 3.21 MIL/uL — ABNORMAL LOW (ref 3.87–5.11)
RBC: 3.13 MIL/uL — ABNORMAL LOW (ref 3.87–5.11)
RDW: 14 % (ref 11.5–15.5)
RDW: 13.8 % (ref 11.5–15.5)
WBC: 10.2 10*3/uL (ref 4.0–10.5)
WBC: 9 10*3/uL (ref 4.0–10.5)
nRBC: 0 % (ref 0.0–0.2)
nRBC: 0 % (ref 0.0–0.2)

## 2023-11-15 LAB — COMPREHENSIVE METABOLIC PANEL
ALT: 17 U/L (ref 0–44)
AST: 65 U/L — ABNORMAL HIGH (ref 15–41)
Albumin: 3.1 g/dL — ABNORMAL LOW (ref 3.5–5.0)
Alkaline Phosphatase: 61 U/L (ref 38–126)
Anion gap: 15 (ref 5–15)
BUN: 8 mg/dL (ref 8–23)
CO2: 28 mmol/L (ref 22–32)
Calcium: 8.5 mg/dL — ABNORMAL LOW (ref 8.9–10.3)
Chloride: 95 mmol/L — ABNORMAL LOW (ref 98–111)
Creatinine, Ser: 0.72 mg/dL (ref 0.44–1.00)
GFR, Estimated: >60 mL/min (ref >60)
Glucose, Bld: 120 mg/dL — ABNORMAL HIGH (ref 70–99)
Potassium: 4.4 mmol/L (ref 3.5–5.1)
Sodium: 138 mmol/L (ref 135–145)
Total Bilirubin: 1.9 mg/dL — ABNORMAL HIGH (ref 0.0–1.2)
Total Protein: 6.7 g/dL (ref 6.5–8.1)

## 2023-11-15 LAB — HEMOGLOBIN A1C
Hgb A1c MFr Bld: 5.1 % (ref 4.8–5.6)
Mean Plasma Glucose: 99.67 mg/dL

## 2023-11-15 LAB — CREATININE, SERUM
Creatinine, Ser: 0.63 mg/dL (ref 0.44–1.00)
GFR, Estimated: >60 mL/min (ref >60)

## 2023-11-15 MED ORDER — APIXABAN 5 MG PO TABS
5.0000 mg | ORAL_TABLET | Freq: Two times a day (BID) | ORAL | Status: DC
  Administered 2023-11-15 – 2023-11-22 (×14): 5 mg via ORAL
  Filled 2023-11-15 (×11): qty 1
  Filled 2023-11-15: qty 2
  Filled 2023-11-15 (×2): qty 1

## 2023-11-15 MED ORDER — CITALOPRAM HYDROBROMIDE 20 MG PO TABS
20.0000 mg | ORAL_TABLET | Freq: Every morning | ORAL | Status: DC
  Administered 2023-11-16 – 2023-11-22 (×7): 20 mg via ORAL
  Filled 2023-11-15 (×7): qty 1

## 2023-11-15 MED ORDER — SODIUM CHLORIDE 0.9% FLUSH
3.0000 mL | Freq: Two times a day (BID) | INTRAVENOUS | Status: DC
  Administered 2023-11-16: 5 mL via INTRAVENOUS

## 2023-11-15 MED ORDER — CITALOPRAM HYDROBROMIDE 20 MG PO TABS
40.0000 mg | ORAL_TABLET | Freq: Every morning | ORAL | Status: DC
  Administered 2023-11-15: 40 mg via ORAL
  Filled 2023-11-15: qty 4

## 2023-11-15 MED ORDER — ACETAMINOPHEN 500 MG PO TABS
1000.0000 mg | ORAL_TABLET | Freq: Four times a day (QID) | ORAL | Status: DC | PRN
Start: 2023-11-15 — End: 2023-11-23
  Administered 2023-11-21: 1000 mg via ORAL
  Filled 2023-11-15: qty 2

## 2023-11-15 MED ORDER — METOPROLOL TARTRATE 25 MG PO TABS
25.0000 mg | ORAL_TABLET | Freq: Two times a day (BID) | ORAL | Status: DC
  Filled 2023-11-15: qty 1

## 2023-11-15 MED ORDER — SODIUM CHLORIDE 0.9% FLUSH: 3.0000 mL | INTRAVENOUS | Status: DC | PRN

## 2023-11-15 MED ORDER — UMECLIDINIUM BROMIDE 62.5 MCG/ACT IN AEPB
1.0000 | INHALATION_SPRAY | Freq: Every day | RESPIRATORY_TRACT | Status: DC
  Administered 2023-11-15 – 2023-11-21 (×7): 1 via RESPIRATORY_TRACT
  Filled 2023-11-15 (×4): qty 7

## 2023-11-15 MED ORDER — DILTIAZEM HCL ER COATED BEADS 120 MG PO CP24
120.0000 mg | ORAL_CAPSULE | Freq: Every day | ORAL | Status: DC
  Administered 2023-11-15 – 2023-11-22 (×8): 120 mg via ORAL
  Filled 2023-11-15 (×9): qty 1

## 2023-11-15 MED ORDER — METOPROLOL TARTRATE 25 MG PO TABS
25.0000 mg | ORAL_TABLET | Freq: Every day | ORAL | Status: DC
  Administered 2023-11-15: 25 mg via ORAL
  Filled 2023-11-15: qty 1

## 2023-11-15 MED ORDER — OXYCODONE HCL 5 MG PO TABS
5.0000 mg | ORAL_TABLET | ORAL | Status: DC | PRN
  Administered 2023-11-15: 5 mg via ORAL
  Filled 2023-11-15 (×2): qty 1

## 2023-11-15 MED ORDER — IOHEXOL 350 MG/ML SOLN
50.0000 mL | Freq: Once | INTRAVENOUS | Status: AC | PRN
  Administered 2023-11-15: 50 mL via INTRAVENOUS

## 2023-11-15 NOTE — Progress Notes (Signed)
 Progress Note   Patient: [name] ZOX:[id number] DOB: 1951-03-04 DOA: 11/14/2023     1 DOS: the patient was seen and examined on 11/15/2023   Brief hospital course: Nayana Lenig is a 73 year old female with medical history significant for nonischemic cardiomyopathy, mitral regurgitation, SVT, GERD, hyperlipidemia, IBS, recent bimalleolar ankle fracture after fall presented from SNF for evaluation of increased heart rate, shortness of breath for 2 days.  Patient is noted to have atrial tachycardia, started on Cardizem drip admitted to [facility] service for further management.  Cardiology consulted.  Assessment and Plan: Atrial tachycardia r/o Afib with RVR vs Multifocal Atrial tachycardia  Hx of SVT, Zio in fall 2024 noted svt  no afib  Continue to monitor on tele progressive unit. Continue on diltiazem drip, wean as able and resume cardizem Resumed metoprolol. CT chest negative for PE.  Cardiology consulted.   Acute on Chronic HFrEF with recovered EF H/o non ischemic cardiomyopathy.  NICM after treatment of breast cancer, chronic HFrEF with recovery with last EF of 60-65%. Heart failure flare up due to persistent tachycardia Continue Lasix 40 mg iv bid  Monitor daily renal function    COPD on chronic oxygen No exacerbation, she is on baseline home O2. Resumed home inhalation medication  Continue prn nebs    Mitral regurgitation stable on last echo.   Abdominal discomfort, nausea: CT abdomen pelvis showed heterogeneous contrast-enhancement of left interpolar right kidney concerning for pyelonephritis.  Urinalysis ordered. RN notified norovirus outbreak in the facility.  Norovirus panel ordered. Continue PPI.   Heparin anemia Continue statin     IBS no active issues    Osteoporosis S/p recent repair for bimalleolar ankle fracture after a fall PT OT evaluation Nursing supportive care  Fall, aspiration precautions        Out of bed to chair. Incentive  spirometry.  Diet:  Diet Orders (From admission, onward)     Start     Ordered   11/14/23 2038  Diet Heart Room service appropriate? Yes; Fluid consistency: Thin  Diet effective now       Question Answer Comment  Room service appropriate? Yes   Fluid consistency: Thin      11/14/23 2039           DVT prophylaxis: heparin injection 5,000 Units Start: 11/14/23 2200  Level of care: Progressive.   Code Status: Full Code  Subjective: Patient is seen and examined today morning. She is lying in bed, on 2L supplemental o2. Has nausea. HR 120 on monitor, maxed on Cardizem per RN.  Physical Exam: Vitals:   11/15/23 0800 11/15/23 0815 11/15/23 0959 11/15/23 1236  BP: (!) 135/93  (!) 140/93 104/67  Pulse: (!) 116 100 83 72  Resp: (!) 25 (!) 22 15 20   Temp:   99.1 F (37.3 C) 98.5 F (36.9 C)  TempSrc:   Oral Oral  SpO2: 100% 100% 97% 100%  Weight:      Height:        General - Elderly Caucasian female, mild secretory distress HEENT - PERRLA, EOMI, atraumatic head, non tender sinuses. Lung - Clear, basal rales, rhonchi, no wheezes. Heart - S1, S2 heard, no murmurs, rubs, no pedal edema. Abdomen - Soft, non tender, nondistended, bowel sounds good Neuro - Alert, awake and oriented x 3, non focal exam. Skin - Warm and dry.  Data Reviewed:      Latest Ref Rng & Units 11/15/2023    4:00 AM 11/15/2023    1:20 AM  11/14/2023   12:50 PM  CBC  WBC 4.0 - 10.5 K/uL 9.0  10.2  9.7   Hemoglobin 12.0 - 15.0 g/dL 9.0  9.3  9.8   Hematocrit 36.0 - 46.0 % 28.5  29.6  30.5   Platelets 150 - 400 K/uL 575  475  557       Latest Ref Rng & Units 11/15/2023    4:00 AM 11/15/2023    1:20 AM 11/14/2023   12:50 PM  BMP  Glucose 70 - 99 mg/dL 784   696   BUN 8 - 23 mg/dL 8   7   Creatinine 2.95 - 1.00 mg/dL 2.84  1.32  4.40   Sodium 135 - 145 mmol/L 138   138   Potassium 3.5 - 5.1 mmol/L 4.4   4.0   Chloride 98 - 111 mmol/L 95   96   CO2 22 - 32 mmol/L 28   30   Calcium 8.9 - 10.3 mg/dL  8.5   8.7    ECHOCARDIOGRAM COMPLETE Result Date: 11/15/2023    ECHOCARDIOGRAM REPORT   Patient Name:   [name] Date of Exam: 11/15/2023 Medical Rec #:  [id number]         Height:       60.0 in Accession #:    [id number]        Weight:       92.0 lb Date of Birth:  1951/07/18        BSA:          1.342 m Patient Age:    72 years          BP:           135/93 mmHg Patient Gender: F                 HR:           133 bpm. Exam Location:  Inpatient Procedure: 2D Echo, Color Doppler and Cardiac Doppler (Both Spectral and Color            Flow Doppler were utilized during procedure). Indications:    Atrial fibrillation  History:        Patient has prior history of Echocardiogram examinations.                 Cardiomyopathy, COPD, Arrythmias:Tachycardia; Risk                 Factors:Former Smoker.  Sonographer:    Lamont Snowball Referring Phys: [id number] SARA-MAIZ A THOMAS IMPRESSIONS  1. Left ventricular ejection fraction, by estimation, is 60 to 65%. The left ventricle has normal function. The left ventricle has no regional wall motion abnormalities. Left ventricular diastolic function could not be evaluated.  2. Right ventricular systolic function is normal. The right ventricular size is normal. There is normal pulmonary artery systolic pressure. The estimated right ventricular systolic pressure is 35.8 mmHg.  3. Left atrial size was severely dilated.  4. Right atrial size was severely dilated.  5. Moderate pericardial effusion. The pericardial effusion is posterior to the left ventricle. There is no evidence of cardiac tamponade.  6. The mitral valve is abnormal. Severe mitral valve regurgitation. No evidence of mitral stenosis.  7. The aortic valve is tricuspid. There is mild calcification of the aortic valve. Aortic valve regurgitation is not visualized. Aortic valve sclerosis/calcification is present, without any evidence of aortic stenosis.  8. Aortic dilatation noted. There is mild dilatation of the  ascending aorta, measuring  41 mm.  9. The inferior vena cava is dilated in size with <50% respiratory variability, suggesting right atrial pressure of 15 mmHg. FINDINGS  Left Ventricle: Left ventricular ejection fraction, by estimation, is 60 to 65%. The left ventricle has normal function. The left ventricle has no regional wall motion abnormalities. The left ventricular internal cavity size was normal in size. There is  no left ventricular hypertrophy. Left ventricular diastolic function could not be evaluated due to atrial fibrillation. Left ventricular diastolic function could not be evaluated. Right Ventricle: The right ventricular size is normal. No increase in right ventricular wall thickness. Right ventricular systolic function is normal. There is normal pulmonary artery systolic pressure. The tricuspid regurgitant velocity is 2.28 m/s, and  with an assumed right atrial pressure of 15 mmHg, the estimated right ventricular systolic pressure is 35.8 mmHg. Left Atrium: Left atrial size was severely dilated. Right Atrium: Right atrial size was severely dilated. Pericardium: A moderately sized pericardial effusion is present. The pericardial effusion is posterior to the left ventricle. There is no evidence of cardiac tamponade. Mitral Valve: The mitral valve is abnormal. Severe mitral valve regurgitation, with centrally-directed jet. No evidence of mitral valve stenosis. MV peak gradient, 10.2 mmHg. The mean mitral valve gradient is 4.3 mmHg. Tricuspid Valve: The tricuspid valve is normal in structure. Tricuspid valve regurgitation is mild . No evidence of tricuspid stenosis. Aortic Valve: The aortic valve is tricuspid. There is mild calcification of the aortic valve. Aortic valve regurgitation is not visualized. Aortic valve sclerosis/calcification is present, without any evidence of aortic stenosis. Aortic valve peak gradient measures 7.8 mmHg. Pulmonic Valve: The pulmonic valve was normal in structure. Pulmonic  valve regurgitation is trivial. No evidence of pulmonic stenosis. Aorta: Aortic dilatation noted. There is mild dilatation of the ascending aorta, measuring 41 mm. Venous: The inferior vena cava is dilated in size with less than 50% respiratory variability, suggesting right atrial pressure of 15 mmHg. IAS/Shunts: No atrial level shunt detected by color flow Doppler.  LEFT VENTRICLE PLAX 2D LVIDd:         4.60 cm LVIDs:         3.00 cm LV PW:         1.30 cm LV IVS:        0.60 cm LVOT diam:     1.90 cm LV SV:         50 LV SV Index:   37 LVOT Area:     2.84 cm  RIGHT VENTRICLE          IVC RV Basal diam:  3.10 cm  IVC diam: 2.10 cm LEFT ATRIUM             Index        RIGHT ATRIUM           Index LA diam:        4.50 cm 3.35 cm/m   RA Area:     20.90 cm LA Vol (A2C):   63.4 ml 47.25 ml/m  RA Volume:   51.50 ml  38.38 ml/m LA Vol (A4C):   57.9 ml 43.15 ml/m LA Biplane Vol: 64.8 ml 48.29 ml/m  AORTIC VALVE AV Area (Vmax): 2.54 cm AV Vmax:        139.67 cm/s AV Peak Grad:   7.8 mmHg LVOT Vmax:      125.33 cm/s LVOT Vmean:     79.300 cm/s LVOT VTI:       0.175 m  AORTA Ao Root  diam: 3.70 cm Ao Asc diam:  4.10 cm MITRAL VALVE                 TRICUSPID VALVE MV Area (PHT): 4.79 cm      TR Peak grad:   20.8 mmHg MV Area VTI:   2.55 cm      TR Vmax:        228.00 cm/s MV Peak grad:  10.2 mmHg MV Mean grad:  4.3 mmHg      SHUNTS MV Vmax:       1.59 m/s      Systemic VTI:  0.17 m MV Vmean:      94.9 cm/s     Systemic Diam: 1.90 cm MV Decel Time: 158 msec MR Peak grad:   115.3 mmHg MR Mean grad:   86.0 mmHg MR Vmax:        537.00 cm/s MR Vmean:       445.5 cm/s MR PISA:        4.02 cm MR PISA Radius: 0.80 cm MV E velocity: 149.67 cm/s Arvilla Meres MD Electronically signed by Arvilla Meres MD Signature Date/Time: 11/15/2023/11:11:39 AM    Final    CT ABDOMEN PELVIS W CONTRAST Result Date: 11/15/2023 CLINICAL DATA:  Acute abdominal pain EXAM: CT ABDOMEN AND PELVIS WITH CONTRAST TECHNIQUE: Multidetector CT  imaging of the abdomen and pelvis was performed using the standard protocol following bolus administration of intravenous contrast. RADIATION DOSE REDUCTION: This exam was performed according to the departmental dose-optimization program which includes automated exposure control, adjustment of the mA and/or kV according to patient size and/or use of iterative reconstruction technique. CONTRAST:  50mL OMNIPAQUE IOHEXOL 350 MG/ML SOLN COMPARISON:  None Available. FINDINGS: Lower Chest: Small pleural effusions with bibasilar atelectasis. Hepatobiliary: Normal hepatic contours. No intra- or extrahepatic biliary dilatation. Status post cholecystectomy. Pancreas: Normal pancreas. No ductal dilatation or peripancreatic fluid collection. Spleen: Normal. Adrenals/Urinary Tract: Poor visualization of the adrenal glands. Heterogeneous contrast enhancement of the interpolar right kidney. Normal left kidney. The urinary bladder is normal for degree of distention Stomach/Bowel: There is no hiatal hernia. Normal duodenal course and caliber. No small bowel dilatation or inflammation. No focal colonic abnormality. Normal appendix. Vascular/Lymphatic: There is calcific atherosclerosis of the abdominal aorta. No lymphadenopathy. Reproductive: Normal uterus. No adnexal mass. Other: Small volume left upper quadrant free fluid. Musculoskeletal: Dextroscoliosis with apex at L2. IMPRESSION: 1. Heterogeneous contrast enhancement of the interpolar right kidney, concerning for pyelonephritis. Correlate with urinalysis. 2. Small pleural effusions with bibasilar atelectasis. 3. Small amount of free fluid in the left upper quadrant. Electronically Signed   By: Deatra Robinson M.D.   On: 11/15/2023 02:20   CT Angio Chest PE W and/or Wo Contrast Result Date: 11/14/2023 CLINICAL DATA:  Shortness of breath.  PE suspected EXAM: CT ANGIOGRAPHY CHEST WITH CONTRAST TECHNIQUE: Multidetector CT imaging of the chest was performed using the standard  protocol during bolus administration of intravenous contrast. Multiplanar CT image reconstructions and MIPs were obtained to evaluate the vascular anatomy. RADIATION DOSE REDUCTION: This exam was performed according to the departmental dose-optimization program which includes automated exposure control, adjustment of the mA and/or kV according to patient size and/or use of iterative reconstruction technique. CONTRAST:  50mL OMNIPAQUE IOHEXOL 350 MG/ML SOLN COMPARISON:  Same day chest radiograph and PET/CT 12/27/1998 FINDINGS: Cardiovascular: Cardiomegaly. Small pericardial effusion. Negative for acute pulmonary embolism. Aortic and coronary artery atherosclerotic calcification. Mediastinum/Nodes: Trachea and esophagus are unremarkable. Shotty mediastinal lymph nodes are likely reactive.  Lungs/Pleura: Respiratory motion obscures detail. Emphysema. Small right-greater-than-left pleural effusions and compressive atelectasis. Mild diffuse bronchial wall thickening. Question interlobular septal thickening though evaluation is limited by motion artifact. No pneumothorax. Upper Abdomen: Low-density free fluid in the left upper quadrant. Musculoskeletal: No acute fracture. Postoperative changes both breasts. Review of the MIP images confirms the above findings. IMPRESSION: 1. Negative for acute pulmonary embolism. 2. Question interstitial pulmonary edema. Small right-greater-than-left pleural effusions and compressive atelectasis. 3. Cardiomegaly with small pericardial effusion. 4. Low-density free fluid in the left upper quadrant. CT abdomen pelvis with IV contrast is recommended for further evaluation. Aortic Atherosclerosis (ICD10-I70.0) and Emphysema (ICD10-J43.9). Electronically Signed   By: Minerva Fester M.D.   On: 11/14/2023 19:08   VAS [place] LOWER EXTREMITY VENOUS (DVT) (ONLY [facility] & [facility]) Result Date: 11/14/2023  Lower Venous DVT Study Patient Name:  [name]  Date of Exam:   11/14/2023 Medical Rec #:  [id number]          Accession #:    [id number] Date of Birth: June 21, 1951         Patient Gender: F Patient Age:   5 years Exam Location:  [facility] Procedure:      VAS [place] LOWER EXTREMITY VENOUS (DVT) Referring Phys: Molly Maduro PATERSON --------------------------------------------------------------------------------  Indications: Edema, and Patient tachycardic on arrival with pulse between 190-220, now post Cardizem rate decreased to 140s-160s..  Risk Factors: Right ORIF of ankle 10/12/23. Limitations: Adult undergarment shadowing in bilateral groins Comparison Study: No prior study on file Performing Technologist: Sherren Kerns RVS  Examination Guidelines: A complete evaluation includes B-mode imaging, spectral Doppler, color Doppler, and power Doppler as needed of all accessible portions of each vessel. Bilateral testing is considered an integral part of a complete examination. Limited examinations for reoccurring indications may be performed as noted. The reflux portion of the exam is performed with the patient in reverse Trendelenburg.  +---------+---------------+---------+-----------+----------+--------------+ RIGHT    CompressibilityPhasicitySpontaneityPropertiesThrombus Aging +---------+---------------+---------+-----------+----------+--------------+ CFV      Full           Yes      No                                  +---------+---------------+---------+-----------+----------+--------------+ SFJ      Full                                                        +---------+---------------+---------+-----------+----------+--------------+ FV Prox  Full                                                        +---------+---------------+---------+-----------+----------+--------------+ FV Mid   Full                                                        +---------+---------------+---------+-----------+----------+--------------+ FV DistalFull                                                         +---------+---------------+---------+-----------+----------+--------------+  PFV      Full                                                        +---------+---------------+---------+-----------+----------+--------------+ POP      Full           No       Yes                                 +---------+---------------+---------+-----------+----------+--------------+ PTV      Full                                                        +---------+---------------+---------+-----------+----------+--------------+ PERO     Full                                                        +---------+---------------+---------+-----------+----------+--------------+   +----+---------------+---------+-----------+----------+--------------+ LEFTCompressibilityPhasicitySpontaneityPropertiesThrombus Aging +----+---------------+---------+-----------+----------+--------------+ CFV Full           No       Yes                                 +----+---------------+---------+-----------+----------+--------------+ SFJ Full                                                        +----+---------------+---------+-----------+----------+--------------+    Summary: RIGHT: - There is no evidence of deep vein thrombosis in the lower extremity.  - No cystic structure found in the popliteal fossa. interstitial edema noted throughout  LEFT: - No evidence of common femoral vein obstruction.   *See table(s) above for measurements and observations.    Preliminary    DG Chest Portable 1 View Result Date: 11/14/2023 CLINICAL DATA:  Short of breath. EXAM: PORTABLE CHEST 1 VIEW COMPARISON:  12/22/2022. FINDINGS: Bilateral lung base opacities obscure the hemidiaphragms. Mid and upper lungs are clear. Cardiac silhouette is normal in size. No mediastinal or hilar masses. No pneumothorax.  Suspect small bilateral effusions. Stable changes from prior breast surgery. Skeletal structures are  grossly intact. IMPRESSION: 1. Bilateral lung base opacities new since the prior chest radiographs, suspected to be a combination of atelectasis/pneumonia and small bilateral effusions. No evidence of pulmonary edema. Electronically Signed   By: Amie Portland M.D.   On: 11/14/2023 13:07    Family Communication: Discussed with patient, she understand and agree. All questions answered.  Disposition: Status is: Inpatient Remains inpatient appropriate because: atrial tachycardia, IV cardizem gtt, cardiology work up.  Planned Discharge Destination: Home with Home Health     Time spent: 39 minutes  Author: Marcelino Duster, MD 11/15/2023 12:45 PM Secure chat 7am to 7pm For on call review [email or web address].

## 2023-11-15 NOTE — Plan of Care (Signed)
  Problem: Clinical Measurements: Goal: Ability to maintain clinical measurements within normal limits will improve Outcome: Progressing   Problem: Activity: Goal: Risk for activity intolerance will decrease Outcome: Progressing   Problem: Pain Managment: Goal: General experience of comfort will improve and/or be controlled Outcome: Progressing   Problem: Safety: Goal: Ability to remain free from injury will improve Outcome: Progressing

## 2023-11-15 NOTE — Consult Note (Addendum)
 Cardiology Consultation   Patient ID: [name] MRN: [id number]; DOB: 1951-02-26  Admit date: 11/14/2023 Date of Consult: 11/15/2023  PCP:  Melida Quitter, PA   [facility] Providers Cardiologist:  Rollene Rotunda, MD        Patient Profile:   [name] is a 73 y.o. female with a hx of Nonischemic cardiomyopathy, paroxysmal atrial fibrillation, SVT, hyperlipidemia, B12 deficiency, COPD, fibromyalgia, GERD, and breast cancer who is being seen 11/15/2023 for the evaluation of atrial fibrillation at the request of Rachael Duster MD.  History of Present Illness:   Ms. Soman presented with the above medical history.  Was previously seen by Dr. Gala Romney for nonischemic cardiomyopathy in 2010 following breast cancer treatment.  The LVEF was initially estimated to be 34% but improved to 60 to 65% .  She went to the ER on 12/22/2022 for a COPD exacerbation, and bronchitis.  Was noted to be in questionable atrial fibrillation with RVR or MAT. That is why no anticoagulant was started.  An echo was done and showed an LVEF of 60 to 65%, grade 1 diastolic dysfunction, mild dilation of aortic root at 40 mm, normal valvular structure, and mild MR.  Was started on Cardizem and had outpatient cardiac monitor placed.  During initial monitor the patient was not on Cardizem.  A second monitor was done with the patient on Cardizem. The monitor did not find any atrial fibrillation.  Runs of SVT were found and a referral was made to EP.  Was most recently seen in the office by Rollene Rotunda, MD on 06/2023. had a telephone visit with Eligha Bridegroom NP on 10/11/2023 for preop risk assessment for the right ankle ORIF.  This was done because the patient fell and had a bimalleolar ankle fracture.  After having the surgery the patient was discharged to a SNF.  After being discharged to the SNF the patient was noticed to have an increased heart rate of 130.  Metoprolol was added to  Cardizem for rate control.  2 days after being discharged from the SNF the patient presented to the ER on 11/14/23 for 2 days of increasing shortness of breath.  Denied having chest pain, fever, chills, nausea, vomiting, and diarrhea In the ER an EKG showed atrial fibrillation with RVR and a rate of 150. BNP was elevated at 891.7.  TSH was normal.  D-dimer was positive but CTPA found no pulmonary embolism.  CTPA did find possible interstitial pulmonary edema, right greater than left pleural effusions, cardiomegaly with small pericardial effusion, and fluid in the left upper quadrant. lower extremity Doppler found no signs of DVT.  Was started on IV Cardizem 10 mg/h and Lopressor 25 mg daily.  Was started on Lasix 40 mg twice daily.   An echo was done on 11/15/2023 and showed an LVEF of 60 to 65%, no regional wall motion abnormalities, normal RV systolic function, severely dilated left atrium, severely dilated right atrium, moderate pericardial effusion with no evidence of tamponade, severe mitral regurgitation, dilated ascending aorta measuring 41 mm, right atrial pressure was estimated at 15 mmHg  On interview the patient affirmed the above history. Reported that she was able to feel her heart race and that she felt like that at the SNF. Denies having Chest pain, diaphoresis, melena, hematochezia, hematuria.   Past Medical History:  Diagnosis Date   Anemia    B12 deficient   Arthritis    B12 deficiency    Breast cancer (HCC)  left breast.   Chronic SI joint pain    Bilateral, Dr. Corliss Skains   COPD (chronic obstructive pulmonary disease) (HCC)    Fibromyalgia    Dr. Corliss Skains   GERD (gastroesophageal reflux disease)    Greater trochanteric bursitis of both hips    Dr. Corliss Skains   Hyperlipidemia    IBS (irritable bowel syndrome)    LV dysfunction    iatrogenic from chemotherapy ; on Carvedilol    Migraines    Osteoporosis    Dr Lomax---> transferring to a new gyn   Oxygen dependent    3  liters    Past Surgical History:  Procedure Laterality Date   ABDOMINAL HYSTERECTOMY     BSO for  Endometriosis   CARPAL TUNNEL RELEASE     left   CERVICAL DISC SURGERY     COLONOSCOPY W/ POLYPECTOMY  1999   Dr Kinnie Scales   ESOPHAGEAL DILATION  2005   Dr Kinnie Scales   MASTECTOMY  2009   bilateral, Dr Jamey Ripa   ORIF ANKLE FRACTURE Right 10/12/2023   Procedure: RIGHT OPEN REDUCTION INTERNAL FIXATION (ORIF) ANKLE FRACTURE;  Surgeon: Huel Cote, MD;  Location: [facility] OR;  Service: Orthopedics;  Laterality: Right;   SEPTOPLASTY         Inpatient Medications: Scheduled Meds:  [START ON 11/16/2023] citalopram  20 mg Oral q AM   furosemide  40 mg Intravenous Q12H   heparin  5,000 Units Subcutaneous Q8H   metoprolol tartrate  25 mg Oral Daily   pantoprazole  40 mg Oral Daily   umeclidinium bromide  1 puff Inhalation Daily   Continuous Infusions:  diltiazem (CARDIZEM) infusion 10 mg/hr (11/15/23 1242)   PRN Meds: acetaminophen **OR** acetaminophen, acetaminophen, levalbuterol, oxyCODONE, promethazine  Allergies:    Allergies  Allergen Reactions   Amoxicillin-Pot Clavulanate     diarrhea   Doxycycline     Nausea & vomiting   Venlafaxine     ? Reaction; ? Blurred vision    Social History:   Social History   Socioeconomic History   Marital status: Widowed    Spouse name: Not on file   Number of children: 1   Years of education: Not on file   Highest education level: 12th grade  Occupational History   Occupation: retired, Warehouse manager   Tobacco Use   Smoking status: Former    Current packs/day: 0.00    Average packs/day: 1 pack/day for 30.0 years (30.0 ttl pk-yrs)    Types: Cigarettes    Start date: 08/31/1960    Quit date: 08/31/1990    Years since quitting: 33.2    Passive exposure: Never   Smokeless tobacco: Never  Vaping Use   Vaping status: Never Used  Substance and Sexual Activity   Alcohol use: No   Drug use: Not Currently    Types: Marijuana    Comment: 08/01/2017  last used    Sexual activity: Not Currently  Other Topics Concern   Not on file  Social History Narrative   Lives w/ husband   Social Drivers of Health   Financial Resource Strain: Low Risk  (05/11/2023)   Overall Financial Resource Strain (CARDIA)    Difficulty of Paying Living Expenses: Not hard at all  Food Insecurity: Food Insecurity Present (11/14/2023)   Hunger Vital Sign    Worried About Running Out of Food in the Last Year: Sometimes true    Ran Out of Food in the Last Year: Sometimes true  Transportation Needs: No Transportation Needs (11/14/2023)  PRAPARE - Administrator, Civil Service (Medical): No    Lack of Transportation (Non-Medical): No  Physical Activity: Inactive (05/11/2023)   Exercise Vital Sign    Days of Exercise per Week: 0 days    Minutes of Exercise per Session: 0 min  Stress: No Stress Concern Present (05/11/2023)   [employer] of Occupational Health - Occupational Stress Questionnaire    Feeling of Stress : Not at all  Social Connections: Socially Isolated (11/14/2023)   Social Connection and Isolation Panel [NHANES]    Frequency of Communication with Friends and Family: More than three times a week    Frequency of Social Gatherings with Friends and Family: More than three times a week    Attends Religious Services: Never    Database administrator or Organizations: No    Attends Banker Meetings: Never    Marital Status: Widowed  Intimate Partner Violence: Not At Risk (11/14/2023)   Humiliation, Afraid, Rape, and Kick questionnaire    Fear of Current or Ex-Partner: No    Emotionally Abused: No    Physically Abused: No    Sexually Abused: No    Family History:    Family History  Problem Relation Age of Onset   Diabetes Mother    Breast cancer Mother    Depression Mother        anxiety   Heart disease Mother        in her 46s   Colon cancer Sister    Healthy Daughter    Stroke Neg Hx      ROS:  Please see the  history of present illness.   All other ROS reviewed and negative.     Physical Exam/Data:   Vitals:   11/15/23 1300 11/15/23 1400 11/15/23 1500 11/15/23 1546  BP: 109/68 120/70 (!) 119/58   Pulse:   82   Resp: 20 20 20    Temp:    98.3 F (36.8 C)  TempSrc:    Oral  SpO2: 100% 100% 99%   Weight:      Height:        Intake/Output Summary (Last 24 hours) at 11/15/2023 1606 Last data filed at 11/15/2023 1310 Gross per 24 hour  Intake 125.73 ml  Output 400 ml  Net -274.27 ml      11/14/2023   12:38 PM 10/12/2023    7:16 AM 10/11/2023   11:32 AM  Last 3 Weights  Weight (lbs) 92 lb 92 lb 91 lb 14.9 oz  Weight (kg) 41.731 kg 41.731 kg 41.7 kg     Body mass index is 17.97 kg/m.  General:  Well nourished, well developed, in no acute distress on 2L nasal canula HEENT: normal Neck: 3+ cm JVD Vascular:  Distal pulses 2+ bilaterally Cardiac:  RRR; systolic ejection murmur Lungs:  bibasilar wheezing. Ext: no edema Musculoskeletal:  No deformities. Skin: warm and dry  Neuro:  CNs 2-12 intact, no focal abnormalities noted Psych:  Normal affect   EKG:  The EKG was personally reviewed and demonstrates:  Normal sinus rhythm with a rate of 91. Telemetry:  Telemetry was personally reviewed and demonstrates:  Atrial fibrillation with RVR. Converted to normal sinus rhythm on 11/15/23 at 12:30 pm.   Relevant CV Studies: Echo on 11/15/23 IMPRESSIONS     1. Left ventricular ejection fraction, by estimation, is 60 to 65%. The  left ventricle has normal function. The left ventricle has no regional  wall motion abnormalities. Left ventricular diastolic  function could not  be evaluated.   2. Right ventricular systolic function is normal. The right ventricular  size is normal. There is normal pulmonary artery systolic pressure. The  estimated right ventricular systolic pressure is 35.8 mmHg.   3. Left atrial size was severely dilated.   4. Right atrial size was severely dilated.   5.  Moderate pericardial effusion. The pericardial effusion is posterior  to the left ventricle. There is no evidence of cardiac tamponade.   6. The mitral valve is abnormal. Severe mitral valve regurgitation. No  evidence of mitral stenosis.   7. The aortic valve is tricuspid. There is mild calcification of the  aortic valve. Aortic valve regurgitation is not visualized. Aortic valve  sclerosis/calcification is present, without any evidence of aortic  stenosis.   8. Aortic dilatation noted. There is mild dilatation of the ascending  aorta, measuring 41 mm.   9. The inferior vena cava is dilated in size with <50% respiratory  variability, suggesting right atrial pressure of 15 mmHg.   FINDINGS   Left Ventricle: Left ventricular ejection fraction, by estimation, is 60  to 65%. The left ventricle has normal function. The left ventricle has no  regional wall motion abnormalities. The left ventricular internal cavity  size was normal in size. There is   no left ventricular hypertrophy. Left ventricular diastolic function  could not be evaluated due to atrial fibrillation. Left ventricular  diastolic function could not be evaluated.   Right Ventricle: The right ventricular size is normal. No increase in  right ventricular wall thickness. Right ventricular systolic function is  normal. There is normal pulmonary artery systolic pressure. The tricuspid  regurgitant velocity is 2.28 m/s, and   with an assumed right atrial pressure of 15 mmHg, the estimated right  ventricular systolic pressure is 35.8 mmHg.   Left Atrium: Left atrial size was severely dilated.   Right Atrium: Right atrial size was severely dilated.   Pericardium: A moderately sized pericardial effusion is present. The  pericardial effusion is posterior to the left ventricle. There is no  evidence of cardiac tamponade.   Mitral Valve: The mitral valve is abnormal. Severe mitral valve  regurgitation, with centrally-directed  jet. No evidence of mitral valve  stenosis. MV peak gradient, 10.2 mmHg. The mean mitral valve gradient is  4.3 mmHg.   Tricuspid Valve: The tricuspid valve is normal in structure. Tricuspid  valve regurgitation is mild . No evidence of tricuspid stenosis.   Aortic Valve: The aortic valve is tricuspid. There is mild calcification  of the aortic valve. Aortic valve regurgitation is not visualized. Aortic  valve sclerosis/calcification is present, without any evidence of aortic  stenosis. Aortic valve peak  gradient measures 7.8 mmHg.   Pulmonic Valve: The pulmonic valve was normal in structure. Pulmonic valve  regurgitation is trivial. No evidence of pulmonic stenosis.   Aorta: Aortic dilatation noted. There is mild dilatation of the ascending  aorta, measuring 41 mm.   Venous: The inferior vena cava is dilated in size with less than 50%  respiratory variability, suggesting right atrial pressure of 15 mmHg.   IAS/Shunts: No atrial level shunt detected by color flow Doppler.   Laboratory Data:  High Sensitivity Troponin:  No results for input(s): "TROPONINIHS" in the last 720 hours.   Chemistry Recent Labs  Lab 11/14/23 1250 11/15/23 0120 11/15/23 0400  NA 138  --  138  K 4.0  --  4.4  CL 96*  --  95*  CO2  30  --  28  GLUCOSE 122*  --  120*  BUN 7*  --  8  CREATININE 0.62 0.63 0.72  CALCIUM 8.7*  --  8.5*  MG 1.9  --   --   GFRNONAA >60 >60 >60  ANIONGAP 12  --  15    Recent Labs  Lab 11/15/23 0400  PROT 6.7  ALBUMIN 3.1*  AST 65*  ALT 17  ALKPHOS 61  BILITOT 1.9*   Lipids No results for input(s): "CHOL", "TRIG", "HDL", "LABVLDL", "LDLCALC", "CHOLHDL" in the last 168 hours.  Hematology Recent Labs  Lab 11/14/23 1250 11/15/23 0120 11/15/23 0400  WBC 9.7 10.2 9.0  RBC 3.33* 3.21* 3.13*  HGB 9.8* 9.3* 9.0*  HCT 30.5* 29.6* 28.5*  MCV 91.6 92.2 91.1  MCH 29.4 29.0 28.8  MCHC 32.1 31.4 31.6  RDW 14.0 14.0 13.8  PLT 557* 475* 575*   Thyroid  Recent  Labs  Lab 11/14/23 1250  TSH 2.991    BNP Recent Labs  Lab 11/14/23 1250  BNP 891.7*    DDimer  Recent Labs  Lab 11/14/23 1250  DDIMER 4.03*     Radiology/Studies:  ECHOCARDIOGRAM COMPLETE Result Date: 11/15/2023    ECHOCARDIOGRAM REPORT   Patient Name:   [name] Date of Exam: 11/15/2023 Medical Rec #:  [id number]         Height:       60.0 in Accession #:    [id number]        Weight:       92.0 lb Date of Birth:  [birth year]        BSA:          1.342 m Patient Age:    72 years          BP:           135/93 mmHg Patient Gender: F                 HR:           133 bpm. Exam Location:  Inpatient Procedure: 2D Echo, Color Doppler and Cardiac Doppler (Both Spectral and Color            Flow Doppler were utilized during procedure). Indications:    Atrial fibrillation  History:        Patient has prior history of Echocardiogram examinations.                 Cardiomyopathy, COPD, Arrythmias:Tachycardia; Risk                 Factors:Former Smoker.  Sonographer:    Lamont Snowball Referring Phys: [id number] SARA-MAIZ A THOMAS IMPRESSIONS  1. Left ventricular ejection fraction, by estimation, is 60 to 65%. The left ventricle has normal function. The left ventricle has no regional wall motion abnormalities. Left ventricular diastolic function could not be evaluated.  2. Right ventricular systolic function is normal. The right ventricular size is normal. There is normal pulmonary artery systolic pressure. The estimated right ventricular systolic pressure is 35.8 mmHg.  3. Left atrial size was severely dilated.  4. Right atrial size was severely dilated.  5. Moderate pericardial effusion. The pericardial effusion is posterior to the left ventricle. There is no evidence of cardiac tamponade.  6. The mitral valve is abnormal. Severe mitral valve regurgitation. No evidence of mitral stenosis.  7. The aortic valve is tricuspid. There is mild calcification of the aortic valve. Aortic valve regurgitation is  not visualized.  Aortic valve sclerosis/calcification is present, without any evidence of aortic stenosis.  8. Aortic dilatation noted. There is mild dilatation of the ascending aorta, measuring 41 mm.  9. The inferior vena cava is dilated in size with <50% respiratory variability, suggesting right atrial pressure of 15 mmHg. FINDINGS  Left Ventricle: Left ventricular ejection fraction, by estimation, is 60 to 65%. The left ventricle has normal function. The left ventricle has no regional wall motion abnormalities. The left ventricular internal cavity size was normal in size. There is  no left ventricular hypertrophy. Left ventricular diastolic function could not be evaluated due to atrial fibrillation. Left ventricular diastolic function could not be evaluated. Right Ventricle: The right ventricular size is normal. No increase in right ventricular wall thickness. Right ventricular systolic function is normal. There is normal pulmonary artery systolic pressure. The tricuspid regurgitant velocity is 2.28 m/s, and  with an assumed right atrial pressure of 15 mmHg, the estimated right ventricular systolic pressure is 35.8 mmHg. Left Atrium: Left atrial size was severely dilated. Right Atrium: Right atrial size was severely dilated. Pericardium: A moderately sized pericardial effusion is present. The pericardial effusion is posterior to the left ventricle. There is no evidence of cardiac tamponade. Mitral Valve: The mitral valve is abnormal. Severe mitral valve regurgitation, with centrally-directed jet. No evidence of mitral valve stenosis. MV peak gradient, 10.2 mmHg. The mean mitral valve gradient is 4.3 mmHg. Tricuspid Valve: The tricuspid valve is normal in structure. Tricuspid valve regurgitation is mild . No evidence of tricuspid stenosis. Aortic Valve: The aortic valve is tricuspid. There is mild calcification of the aortic valve. Aortic valve regurgitation is not visualized. Aortic valve sclerosis/calcification  is present, without any evidence of aortic stenosis. Aortic valve peak gradient measures 7.8 mmHg. Pulmonic Valve: The pulmonic valve was normal in structure. Pulmonic valve regurgitation is trivial. No evidence of pulmonic stenosis. Aorta: Aortic dilatation noted. There is mild dilatation of the ascending aorta, measuring 41 mm. Venous: The inferior vena cava is dilated in size with less than 50% respiratory variability, suggesting right atrial pressure of 15 mmHg. IAS/Shunts: No atrial level shunt detected by color flow Doppler.  LEFT VENTRICLE PLAX 2D LVIDd:         4.60 cm LVIDs:         3.00 cm LV PW:         1.30 cm LV IVS:        0.60 cm LVOT diam:     1.90 cm LV SV:         50 LV SV Index:   37 LVOT Area:     2.84 cm  RIGHT VENTRICLE          IVC RV Basal diam:  3.10 cm  IVC diam: 2.10 cm LEFT ATRIUM             Index        RIGHT ATRIUM           Index LA diam:        4.50 cm 3.35 cm/m   RA Area:     20.90 cm LA Vol (A2C):   63.4 ml 47.25 ml/m  RA Volume:   51.50 ml  38.38 ml/m LA Vol (A4C):   57.9 ml 43.15 ml/m LA Biplane Vol: 64.8 ml 48.29 ml/m  AORTIC VALVE AV Area (Vmax): 2.54 cm AV Vmax:        139.67 cm/s AV Peak Grad:   7.8 mmHg LVOT Vmax:  125.33 cm/s LVOT Vmean:     79.300 cm/s LVOT VTI:       0.175 m  AORTA Ao Root diam: 3.70 cm Ao Asc diam:  4.10 cm MITRAL VALVE                 TRICUSPID VALVE MV Area (PHT): 4.79 cm      TR Peak grad:   20.8 mmHg MV Area VTI:   2.55 cm      TR Vmax:        228.00 cm/s MV Peak grad:  10.2 mmHg MV Mean grad:  4.3 mmHg      SHUNTS MV Vmax:       1.59 m/s      Systemic VTI:  0.17 m MV Vmean:      94.9 cm/s     Systemic Diam: 1.90 cm MV Decel Time: 158 msec MR Peak grad:   115.3 mmHg MR Mean grad:   86.0 mmHg MR Vmax:        537.00 cm/s MR Vmean:       445.5 cm/s MR PISA:        4.02 cm MR PISA Radius: 0.80 cm MV E velocity: 149.67 cm/s Arvilla Meres MD Electronically signed by Arvilla Meres MD Signature Date/Time: 11/15/2023/11:11:39 AM    Final     CT ABDOMEN PELVIS W CONTRAST Result Date: 11/15/2023 CLINICAL DATA:  Acute abdominal pain EXAM: CT ABDOMEN AND PELVIS WITH CONTRAST TECHNIQUE: Multidetector CT imaging of the abdomen and pelvis was performed using the standard protocol following bolus administration of intravenous contrast. RADIATION DOSE REDUCTION: This exam was performed according to the departmental dose-optimization program which includes automated exposure control, adjustment of the mA and/or kV according to patient size and/or use of iterative reconstruction technique. CONTRAST:  50mL OMNIPAQUE IOHEXOL 350 MG/ML SOLN COMPARISON:  None Available. FINDINGS: Lower Chest: Small pleural effusions with bibasilar atelectasis. Hepatobiliary: Normal hepatic contours. No intra- or extrahepatic biliary dilatation. Status post cholecystectomy. Pancreas: Normal pancreas. No ductal dilatation or peripancreatic fluid collection. Spleen: Normal. Adrenals/Urinary Tract: Poor visualization of the adrenal glands. Heterogeneous contrast enhancement of the interpolar right kidney. Normal left kidney. The urinary bladder is normal for degree of distention Stomach/Bowel: There is no hiatal hernia. Normal duodenal course and caliber. No small bowel dilatation or inflammation. No focal colonic abnormality. Normal appendix. Vascular/Lymphatic: There is calcific atherosclerosis of the abdominal aorta. No lymphadenopathy. Reproductive: Normal uterus. No adnexal mass. Other: Small volume left upper quadrant free fluid. Musculoskeletal: Dextroscoliosis with apex at L2. IMPRESSION: 1. Heterogeneous contrast enhancement of the interpolar right kidney, concerning for pyelonephritis. Correlate with urinalysis. 2. Small pleural effusions with bibasilar atelectasis. 3. Small amount of free fluid in the left upper quadrant. Electronically Signed   By: Deatra Robinson M.D.   On: 11/15/2023 02:20   CT Angio Chest PE W and/or Wo Contrast Result Date: 11/14/2023 CLINICAL DATA:   Shortness of breath.  PE suspected EXAM: CT ANGIOGRAPHY CHEST WITH CONTRAST TECHNIQUE: Multidetector CT imaging of the chest was performed using the standard protocol during bolus administration of intravenous contrast. Multiplanar CT image reconstructions and MIPs were obtained to evaluate the vascular anatomy. RADIATION DOSE REDUCTION: This exam was performed according to the departmental dose-optimization program which includes automated exposure control, adjustment of the mA and/or kV according to patient size and/or use of iterative reconstruction technique. CONTRAST:  50mL OMNIPAQUE IOHEXOL 350 MG/ML SOLN COMPARISON:  Same day chest radiograph and PET/CT 12/27/1998 FINDINGS: Cardiovascular: Cardiomegaly. Small pericardial effusion.  Negative for acute pulmonary embolism. Aortic and coronary artery atherosclerotic calcification. Mediastinum/Nodes: Trachea and esophagus are unremarkable. Shotty mediastinal lymph nodes are likely reactive. Lungs/Pleura: Respiratory motion obscures detail. Emphysema. Small right-greater-than-left pleural effusions and compressive atelectasis. Mild diffuse bronchial wall thickening. Question interlobular septal thickening though evaluation is limited by motion artifact. No pneumothorax. Upper Abdomen: Low-density free fluid in the left upper quadrant. Musculoskeletal: No acute fracture. Postoperative changes both breasts. Review of the MIP images confirms the above findings. IMPRESSION: 1. Negative for acute pulmonary embolism. 2. Question interstitial pulmonary edema. Small right-greater-than-left pleural effusions and compressive atelectasis. 3. Cardiomegaly with small pericardial effusion. 4. Low-density free fluid in the left upper quadrant. CT abdomen pelvis with IV contrast is recommended for further evaluation. Aortic Atherosclerosis (ICD10-I70.0) and Emphysema (ICD10-J43.9). Electronically Signed   By: Minerva Fester M.D.   On: 11/14/2023 19:08   VAS [place] LOWER EXTREMITY  VENOUS (DVT) (ONLY [facility] & [facility]) Result Date: 11/14/2023  Lower Venous DVT Study Patient Name:  [name]  Date of Exam:   11/14/2023 Medical Rec #: [id number]          Accession #:    [id number] Date of Birth: [birth year]         Patient Gender: F Patient Age:   79 years Exam Location:  [facility] Procedure:      VAS [place] LOWER EXTREMITY VENOUS (DVT) Referring Phys: Molly Maduro PATERSON --------------------------------------------------------------------------------  Indications: Edema, and Patient tachycardic on arrival with pulse between 190-220, now post Cardizem rate decreased to 140s-160s..  Risk Factors: Right ORIF of ankle 10/12/23. Limitations: Adult undergarment shadowing in bilateral groins Comparison Study: No prior study on file Performing Technologist: Sherren Kerns RVS  Examination Guidelines: A complete evaluation includes B-mode imaging, spectral Doppler, color Doppler, and power Doppler as needed of all accessible portions of each vessel. Bilateral testing is considered an integral part of a complete examination. Limited examinations for reoccurring indications may be performed as noted. The reflux portion of the exam is performed with the patient in reverse Trendelenburg.  +---------+---------------+---------+-----------+----------+--------------+ RIGHT    CompressibilityPhasicitySpontaneityPropertiesThrombus Aging +---------+---------------+---------+-----------+----------+--------------+ CFV      Full           Yes      No                                  +---------+---------------+---------+-----------+----------+--------------+ SFJ      Full                                                        +---------+---------------+---------+-----------+----------+--------------+ FV Prox  Full                                                        +---------+---------------+---------+-----------+----------+--------------+ FV Mid   Full                                                         +---------+---------------+---------+-----------+----------+--------------+  FV DistalFull                                                        +---------+---------------+---------+-----------+----------+--------------+ PFV      Full                                                        +---------+---------------+---------+-----------+----------+--------------+ POP      Full           No       Yes                                 +---------+---------------+---------+-----------+----------+--------------+ PTV      Full                                                        +---------+---------------+---------+-----------+----------+--------------+ PERO     Full                                                        +---------+---------------+---------+-----------+----------+--------------+   +----+---------------+---------+-----------+----------+--------------+ LEFTCompressibilityPhasicitySpontaneityPropertiesThrombus Aging +----+---------------+---------+-----------+----------+--------------+ CFV Full           No       Yes                                 +----+---------------+---------+-----------+----------+--------------+ SFJ Full                                                        +----+---------------+---------+-----------+----------+--------------+    Summary: RIGHT: - There is no evidence of deep vein thrombosis in the lower extremity.  - No cystic structure found in the popliteal fossa. interstitial edema noted throughout  LEFT: - No evidence of common femoral vein obstruction.   *See table(s) above for measurements and observations.    Preliminary    DG Chest Portable 1 View Result Date: 11/14/2023 CLINICAL DATA:  Short of breath. EXAM: PORTABLE CHEST 1 VIEW COMPARISON:  12/22/2022. FINDINGS: Bilateral lung base opacities obscure the hemidiaphragms. Mid and upper lungs are clear. Cardiac silhouette is normal in size. No  mediastinal or hilar masses. No pneumothorax.  Suspect small bilateral effusions. Stable changes from prior breast surgery. Skeletal structures are grossly intact. IMPRESSION: 1. Bilateral lung base opacities new since the prior chest radiographs, suspected to be a combination of atelectasis/pneumonia and small bilateral effusions. No evidence of pulmonary edema. Electronically Signed   By: Amie Portland M.D.   On: 11/14/2023 13:07     Assessment and Plan:   [name] is  a 74 y.o. female with a hx of Nonischemic cardiomyopathy, paroxysmal atrial fibrillation, SVT, hyperlipidemia, B12 deficiency, COPD, fibromyalgia, GERD, and breast cancer who is being seen 11/15/2023 for the evaluation of atrial fibrillation at the request of sreeram Narendranath MD.  Acute heart failure with improved ejection fraction Severe mitral regurgitation Moderate pericardial effusion An echo was done on 11/15/2023 and showed an LVEF of 60 to 65%, no regional wall motion abnormalities, normal RV systolic function, severely dilated left atrium, severely dilated right atrium, moderate pericardial effusion with no evidence of tamponade, severe mitral regurgitation, dilated ascending aorta measuring 41 mm, right atrial pressure was estimated at 15 mmHg. - echo was done on 12/23/2022 and showed an LVEF of 60 to 65%, grade 1 diastolic dysfunction, mild dilation of aortic root at 40 mm, normal valvular structure, and mild MR. -Was previously started on IV Lasix 40 mg twice daily.  On 11/15/2023 creatinine 0.72, potassium 4.4, sodium 138, blood pressure at 1230 was 104/67, heart rate 73. Is net out 274.3 ml.  - Continue IV lasix 40 mg. - Planning on ordering a TEE for tomorrow for assessment of MR and to evaluate pericardial effusion.   Paroxysmal atrial flutter CHA2DS2-VASc 6.  CHF 1, vascular disease 1, female 1, age 53, Prior stroke. -Was initially found during COPD exacerbation on 11/2022. - Was previously started on IV  Cardizem 10 mg/h and Lopressor 25 mg daily. Is currently rate controlled the rate at 1230 was 73 and blood pressure was 104/6. -Was previously started on subcutaneous heparin for DVT prophylaxis. - TSH normal at 2.991 - Start IV heparin for atrial fibrillation. due to hemoglobin of 9.0 to see if anemia worsens. The patient denies melena, hematochezia, hematuria. - Stop IV Cardizem as most recent BP was slightly low at 104/67 - Restart home Cardizem 120 mg daily. - Increase metoprolol tartrate to 25 mg twice daily.  Coronary artery disease - CAD was seen on pulmonary CT on 11/14/2023 - Start atorvastatin 40 mg daily  Hyperlipidemia - LDL on 08/30/2023 was 160 - Goal is less than 70 - Order lipids and LPA - Start atorvastatin 40 mg daily  Normocytic anemia -Hemoglobin on 11/15/2023 was 9.0.  Baseline hemoglobin appears to be between 12 and 14. - Consider Iron labs and FOBT - Management per primary  Management per primary COPD GERD Fibromyalgia   Risk Assessment/Risk Scores:  { CHA2DS2-VASc Score = 4   This indicates a 4.8% annual risk of stroke. The patient's score is based upon: CHF History: 1 HTN History: 0 Diabetes History: 0 Stroke History: 0 Vascular Disease History: 1 Age Score: 1 Gender Score: 1     For questions or updates, please contact [facility] Please consult [email or web address] for contact info under    Signed, Arabella Merles, PA-C  11/15/2023 4:06 PM   I have personally seen and examined the patient.  My HPI, Exam, and assessment and plan are below, independent of the NPP above.  Ms. Ringold is a 73 year old female with nonobstructive coronary artery disease, heart failure with reduced ejection fraction, COPD, and supraventricular tachycardia who presents with atrial fibrillation management.  She was found to have atrial arrhythmia and concerns for heart failure. She has been experiencing significant shortness of breath, which is described as  slightly multifactorial.  She carries a diagnosis of atrial fibrillation and atrial flutter from November 2024, this is largely from a note from [facility]. She denies prior stroke AF/AFL  Her heart rate was recorded  at 141 during imaging. Her mitral regurgitation was noted to be much less in a prior study from 2024, which was done largely on off-axis windows. Her left atria have dilated significantly since then.  She has a history of heart failure with reduced ejection fraction that has recovered (chemo mediated, saw AHF- DB), but now she is experiencing bilateral pleural effusions, noting it is hard to speak. [name], by CTPE she was found to have a small pericardial effusion. There is no evidence of left atrial appendage thrombus, but there is notable rotation of the cardiac silhouette and aortic atherosclerosis. Coronary artery calcifications are seen in all three arteries.  She has significant COPD and is on chronic oxygen therapy. She is experiencing bilateral pleural effusions with secondary atelectasis. She also has interstitial pulmonary edema involving the apex, significant pleural effusion, and atelectasis.  Exam notable for  Gen: no distress, thin and frail female   Neck: No JVD Cardiac: No Rubs or Gallops, systolic Murmur, RRR +2 pulses Respiratory: Rhonchi bilateral, normal effort, normal  respiratory rate GI: Soft, nontender, non-distended, hyperactive BS MS: No  edema;  moves all extremities Integument: Skin feels warm Neuro:  At time of evaluation, alert and oriented to person/place/time/situation  Psych: Normal affect, patient feels ok   My personal review of the data:   RADIOLOGY Radiology: Interstitial pulmonary edema involving the apex, significant pleural effusion, atelectasis, no left atrial appendage thrombus, notable rotation of the cardiac silhouette, aortic atherosclerosis, coronary artery calcifications in all three arteries  (11/14/2023)  DIAGNOSTIC EKG: Low voltage, suspicious for flutter (11/15/2023) EKG: Sinus rhythm (11/15/2023) EKG: Sinus rhythm (11/14/2023) Echocardiogram: Moderate pericardial effusion overlying the left ventricle without evidence of tamponade, normal function, severe biatrial dilation, severe mitral regurgitation, moderate tricuspid regurgitation, small IVC (11/15/2023) Echocardiogram: Less mitral regurgitation, no pericardial effusion, significant left atrial dilation (2024) Echocardiogram: Small pericardial effusion (09/09/2023)  In assessment and plan:   Atrial Fibrillation and Atrial Flutter Paroxysmal atrial tachycardia and supraventricular tachycardia with recent episodes of atrial fibrillation and atrial flutter. Current episode involves heart rates up to 140s despite diltiazem drip. EKGs show low voltage suspicious for flutter, and echocardiogram reveals severe biatrial dilation and severe mitral regurgitation. Presently in sinus rhythm. Notable rotation of cardiac silhouette and aortic atherosclerosis with coronary artery calcifications in all three arteries. No evidence of left atrial appendage thrombus. - in SR now; AFL 10 AM - will start [facility], will transition to oral AV nodal agents.  Heart Failure Recovered EF Heart failure with reduced ejection fraction that has recovered. Currently experiencing bilateral pleural effusions and interstitial pulmonary edema involving the apex, with significant pleural effusion and atelectasis. Echocardiogram shows moderate pericardial effusion overlying the left ventricle without evidence of tamponade. Left atria have dilated significantly since prior study. - Administer IV Lasix as planned  Severe MR TEE is recommended: [facility] has been requested to perform a transesophageal echocardiogram on [name] for severe mitral regurgitation.  After careful review of history and examination, the risks and benefits of transesophageal  echocardiogram have been explained including risks of esophageal damage, perforation (1:10,000 risk), bleeding, pharyngeal hematoma as well as other potential complications associated with conscious sedation including aspiration, arrhythmia, respiratory failure and death. Alternatives to treatment were discussed, questions were answered. Patient is willing to proceed.  - we would consider mTEER if she does not improve sx with medical therapy  Pericardial effusion without tamponade physiology - no plans for pericardiocentesis at this time  Chronic Obstructive Pulmonary Disease (COPD) Atelectasis Significant  COPD with notable shortness of breath, slightly multifactorial. Bilateral pleural effusions with secondary atelectasis noted. - Recommend use of incentive spirometer. - COPD tx as per primary    Riley Lam, MD FASE [facility] Cardiologist [facility]  [address] [place], [address] [place], [place] [address] [phone]  4:27 PM

## 2023-11-15 NOTE — ED Notes (Signed)
 Patient transported to CT

## 2023-11-15 NOTE — Consult Note (Signed)
 [facility] Liaison Note  11/15/2023  [name] 02/17/1951 [id number]  Covering for Charlesetta Shanks, RN [facility] hospital liaison).  Location: [facility] Liaison screened the patient remotely at [facility].  Insurance: [employer]   [name] is a 73 y.o. female who is a Primary Care Patient of Jairo Ben, Simmie Davies, PA [facility] at [facility]. The patient was screened for 30 day readmission hospitalization with noted medium risk score for unplanned readmission risk with 2 IP/1 ED in 6 months.  The patient was assessed for potential Care Management service needs for post hospital transition for care coordination. Review of patient's electronic medical record reveals patient was admitted for Atrial tachycardia. [facility] with previous services with [facility] ([facility]). Liaison collaborated with [facility] and pt was evaluated for VBCI community upon d/c from the previous SNF but readmitted. Due to risk and readmission, liaison will continue to follow for VBCI services with a referral if appropriate upon her discharged disposition.  Plan: [facility] Liaison will continue to follow progress and disposition to asess for post hospital community care coordination/management needs.  Referral request for community care coordination: anticipate Transitions of Care Team follow up.   VBCI Care Management/Population Health does not replace or interfere with any arrangements made by the Inpatient Transition of Care team.   For questions contact:   Elliot Cousin, RN, BSN Hospital Liaison [place]   [facility], Population Health Office Hours MTWF  8:00 am-6:00 pm Direct Dial: [phone] mobile [email or web address]

## 2023-11-15 NOTE — TOC Initial Note (Signed)
 Transition of Care [facility]) - Initial/Assessment Note    Patient Details  Name: [name] MRN: [id number] Date of Birth: 1951-02-21  Transition of [facility] CM/SW Contact:    Gala Lewandowsky, RN Phone Number: 11/15/2023, 12:39 PM  Clinical Narrative:  Patient presented for shortness of breath x 2 days. Patient nauseated at the time of visit by Case Manager. Patient was previously discharged from [facility] on 11-12-23-patient was in her co pay days at the time of discharge. SNF arranged HH for the patient with [facility]-disciplines are PT/OT/RN/Aide. Patient will need resumption orders if home continues to be the plan. PT/OT to consult- Case Manager will continue to follow for additional transition of care needs as the patient progresses.                  Expected Discharge Plan: Skilled Nursing Facility Barriers to Discharge: Continued Medical Work up  Expected Discharge Plan and Services   Discharge Planning Services: CM Consult   Living arrangements for the past 2 months: Single Family Home    HH Arranged: RN, Disease Management, PT, OT, Nurse's Aide HH Agency: [facility] Date [facility] Agency Contacted: 11/15/23 Time HH Agency Contacted: [phone] Representative spoke with at [facility] Agency: Clifton Custard  Prior Living Arrangements/Services Living arrangements for the past 2 months: Single Family Home Lives with::  (support of daughter and sister.) Patient language and need for interpreter reviewed:: Yes    Criminal Activity/Legal Involvement Pertinent to Current Situation/Hospitalization: No - Comment as needed  Activities of Daily Living   ADL Screening (condition at time of admission) Independently performs ADLs?: No Does the patient have a NEW difficulty with bathing/dressing/toileting/self-feeding that is expected to last >3 days?: Yes (Initiates electronic notice to provider for possible OT consult) Does the patient have a NEW difficulty with  getting in/out of bed, walking, or climbing stairs that is expected to last >3 days?: Yes (Initiates electronic notice to provider for possible PT consult) Does the patient have a NEW difficulty with communication that is expected to last >3 days?: No Is the patient deaf or have difficulty hearing?: Yes Does the patient have difficulty seeing, even when wearing glasses/contacts?: Yes Does the patient have difficulty concentrating, remembering, or making decisions?: Yes  Permission Sought/Granted Permission sought to share information with : Case Manager, Family Supports     Emotional Assessment Appearance:: Appears stated age Attitude/Demeanor/Rapport: Unable to Assess Affect (typically observed): Unable to Assess   Alcohol / Substance Use: Not Applicable Psych Involvement: No (comment)  Admission diagnosis:  Atrial tachycardia (HCC) [I47.19] Chronic respiratory failure with hypoxia (HCC) [J96.11] Patient Active Problem List   Diagnosis Date Noted   Closed bimalleolar fracture of right ankle 10/12/2023   Closed bimalleolar fracture of right ankle, initial encounter 10/12/2023   Atrial tachycardia (HCC) 06/05/2023   Chronic respiratory failure with hypoxia (HCC) 02/19/2023   Protein-calorie malnutrition, severe (HCC) 12/24/2022   Tachyarrhythmia 12/22/2022   Hypokalemia 12/22/2022   Estrogen deficiency 06/28/2019   Osteopenia after menopause 11/06/2017   MDD (major depressive disorder), recurrent episode, moderate (HCC) 09/22/2017   Suicide attempt (HCC)    B12 deficiency 08/03/2017   Other insomnia 11/23/2016   DDD (degenerative disc disease), cervical 11/23/2016   DDD (degenerative disc disease), lumbar 11/23/2016   Other idiopathic scoliosis, lumbar region 11/23/2016   COPD mixed type (HCC) 11/20/2013   Vitamin D deficiency 07/06/2011   CARDIOMYOPATHY, PRIMARY, DILATED 01/01/2009   Malignant tumor of breast (HCC) 07/31/2008   Mixed  hyperlipidemia 10/18/2007   Esophagitis  10/18/2007   GAD (generalized anxiety disorder) 10/12/2007   IBS 10/12/2007   Primary osteoarthritis of both hands 10/12/2007   Osteoporosis 10/12/2007   History of colonic polyps 10/12/2007   MIGRAINES, HX OF 10/12/2007   Fibromyalgia--Dr. Victory Dakin, on Ultram 07/14/2007   PCP:  Melida Quitter, PA Pharmacy:   [id number] - [place], [place] - [address] [place] [address] Phone: [phone] Fax: [phone]  [facility] [address] [place] [address] Phone: [phone] Fax: [phone]     Social Drivers of Health (SDOH) Social History: SDOH Screenings   Food Insecurity: Food Insecurity Present (11/14/2023)  Housing: High Risk (11/14/2023)  Transportation Needs: No Transportation Needs (11/14/2023)  Utilities: Not At Risk (11/14/2023)  Alcohol Screen: Low Risk  (05/11/2023)  Depression (PHQ2-9): Medium Risk (09/16/2023)  Financial Resource Strain: Low Risk  (05/11/2023)  Physical Activity: Inactive (05/11/2023)  Social Connections: Socially Isolated (11/14/2023)  Stress: No Stress Concern Present (05/11/2023)  Tobacco Use: Medium Risk (11/14/2023)  Health Literacy: Adequate Health Literacy (05/11/2023)   SDOH Interventions:     Readmission Risk Interventions     No data to display

## 2023-11-16 ENCOUNTER — Other Ambulatory Visit (HOSPITAL_COMMUNITY): Payer: Self-pay

## 2023-11-16 ENCOUNTER — Encounter (HOSPITAL_COMMUNITY)
Admission: EM | Disposition: A | Discharge status: Skilled Nursing Facility | Payer: Self-pay | Source: Home / Self Care | Attending: Internal Medicine

## 2023-11-16 ENCOUNTER — Inpatient Hospital Stay (HOSPITAL_COMMUNITY): Admitting: Certified Registered"

## 2023-11-16 ENCOUNTER — Inpatient Hospital Stay (HOSPITAL_COMMUNITY)

## 2023-11-16 ENCOUNTER — Encounter (HOSPITAL_COMMUNITY): Payer: Self-pay | Admitting: Cardiology

## 2023-11-16 ENCOUNTER — Telehealth (HOSPITAL_COMMUNITY): Payer: Self-pay | Admitting: Pharmacy Technician

## 2023-11-16 DIAGNOSIS — E876 Hypokalemia: Secondary | ICD-10-CM | POA: Diagnosis not present

## 2023-11-16 DIAGNOSIS — I4719 Other supraventricular tachycardia: Secondary | ICD-10-CM | POA: Diagnosis not present

## 2023-11-16 DIAGNOSIS — I509 Heart failure, unspecified: Secondary | ICD-10-CM | POA: Diagnosis not present

## 2023-11-16 DIAGNOSIS — E43 Unspecified severe protein-calorie malnutrition: Secondary | ICD-10-CM

## 2023-11-16 DIAGNOSIS — J449 Chronic obstructive pulmonary disease, unspecified: Secondary | ICD-10-CM | POA: Diagnosis not present

## 2023-11-16 DIAGNOSIS — I5023 Acute on chronic systolic (congestive) heart failure: Secondary | ICD-10-CM | POA: Diagnosis not present

## 2023-11-16 DIAGNOSIS — I428 Other cardiomyopathies: Secondary | ICD-10-CM | POA: Diagnosis not present

## 2023-11-16 DIAGNOSIS — I340 Nonrheumatic mitral (valve) insufficiency: Secondary | ICD-10-CM

## 2023-11-16 DIAGNOSIS — I4891 Unspecified atrial fibrillation: Secondary | ICD-10-CM | POA: Diagnosis not present

## 2023-11-16 DIAGNOSIS — I4892 Unspecified atrial flutter: Secondary | ICD-10-CM | POA: Diagnosis not present

## 2023-11-16 HISTORY — PX: CARDIOVERSION: EP1203

## 2023-11-16 HISTORY — PX: TRANSESOPHAGEAL ECHOCARDIOGRAM (CATH LAB): EP1270

## 2023-11-16 LAB — CBC
HCT: 31.1 % — ABNORMAL LOW (ref 36.0–46.0)
Hemoglobin: 10.1 g/dL — ABNORMAL LOW (ref 12.0–15.0)
MCH: 28.6 pg (ref 26.0–34.0)
MCHC: 32.5 g/dL (ref 30.0–36.0)
MCV: 88.1 fL (ref 80.0–100.0)
Platelets: 708 10*3/uL — ABNORMAL HIGH (ref 150–400)
RBC: 3.53 MIL/uL — ABNORMAL LOW (ref 3.87–5.11)
RDW: 13.8 % (ref 11.5–15.5)
WBC: 7.9 10*3/uL (ref 4.0–10.5)
nRBC: 0 % (ref 0.0–0.2)

## 2023-11-16 LAB — BASIC METABOLIC PANEL
Anion gap: 14 (ref 5–15)
BUN: 9 mg/dL (ref 8–23)
CO2: 34 mmol/L — ABNORMAL HIGH (ref 22–32)
Calcium: 8.6 mg/dL — ABNORMAL LOW (ref 8.9–10.3)
Chloride: 89 mmol/L — ABNORMAL LOW (ref 98–111)
Creatinine, Ser: 0.7 mg/dL (ref 0.44–1.00)
GFR, Estimated: >60 mL/min (ref >60)
Glucose, Bld: 91 mg/dL (ref 70–99)
Potassium: 2.9 mmol/L — ABNORMAL LOW (ref 3.5–5.1)
Sodium: 137 mmol/L (ref 135–145)

## 2023-11-16 LAB — LIPID PANEL
Cholesterol: 202 mg/dL — ABNORMAL HIGH (ref 0–200)
HDL: 52 mg/dL (ref >40)
LDL Cholesterol: 131 mg/dL — ABNORMAL HIGH (ref 0–99)
Total CHOL/HDL Ratio: 3.9 ratio
Triglycerides: 94 mg/dL (ref <150)
VLDL: 19 mg/dL (ref 0–40)

## 2023-11-16 LAB — ECHO TEE

## 2023-11-16 LAB — POTASSIUM: Potassium: 2.8 mmol/L — ABNORMAL LOW (ref 3.5–5.1)

## 2023-11-16 LAB — MAGNESIUM: Magnesium: 1.6 mg/dL — ABNORMAL LOW (ref 1.7–2.4)

## 2023-11-16 MED ORDER — SODIUM CHLORIDE 0.9 % IV SOLN
INTRAVENOUS | Status: DC | PRN
Start: 2023-11-16 — End: 2023-11-16

## 2023-11-16 MED ORDER — AMIODARONE LOAD VIA INFUSION
150.0000 mg | Freq: Once | INTRAVENOUS | Status: AC
  Administered 2023-11-16: 150 mg via INTRAVENOUS
  Filled 2023-11-16: qty 83.34

## 2023-11-16 MED ORDER — PHENYLEPHRINE HCL (PRESSORS) 10 MG/ML IV SOLN
INTRAVENOUS | Status: DC | PRN
Start: 2023-11-16 — End: 2023-11-16
  Administered 2023-11-16: 160 ug via INTRAVENOUS
  Administered 2023-11-16: 240 ug via INTRAVENOUS
  Administered 2023-11-16: 160 ug via INTRAVENOUS

## 2023-11-16 MED ORDER — POTASSIUM CHLORIDE CRYS ER 20 MEQ PO TBCR
40.0000 meq | EXTENDED_RELEASE_TABLET | Freq: Two times a day (BID) | ORAL | Status: DC
  Administered 2023-11-16: 40 meq via ORAL
  Filled 2023-11-16 (×2): qty 2

## 2023-11-16 MED ORDER — MAGNESIUM SULFATE IN D5W 1-5 GM/100ML-% IV SOLN
1.0000 g | Freq: Once | INTRAVENOUS | Status: AC
  Administered 2023-11-17: 1 g via INTRAVENOUS
  Filled 2023-11-16: qty 100

## 2023-11-16 MED ORDER — PROPOFOL 10 MG/ML IV BOLUS
INTRAVENOUS | Status: DC | PRN
  Administered 2023-11-16: 20 mg via INTRAVENOUS
  Administered 2023-11-16: 80 mg via INTRAVENOUS
  Administered 2023-11-16 (×3): 20 mg via INTRAVENOUS

## 2023-11-16 MED ORDER — VASOPRESSIN 20 UNIT/ML IV SOLN
INTRAVENOUS | Status: DC | PRN
  Administered 2023-11-16: 1 [IU] via INTRAVENOUS
  Administered 2023-11-16 (×2): .5 [IU] via INTRAVENOUS
  Administered 2023-11-16: 1 [IU] via INTRAVENOUS
  Administered 2023-11-16: .5 [IU] via INTRAVENOUS

## 2023-11-16 MED ORDER — POTASSIUM CHLORIDE 10 MEQ/100ML IV SOLN
10.0000 meq | INTRAVENOUS | Status: AC
  Administered 2023-11-16 (×2): 10 meq via INTRAVENOUS
  Filled 2023-11-16 (×2): qty 100

## 2023-11-16 MED ORDER — ATORVASTATIN CALCIUM 40 MG PO TABS
40.0000 mg | ORAL_TABLET | Freq: Every day | ORAL | Status: DC
  Administered 2023-11-16 – 2023-11-22 (×7): 40 mg via ORAL
  Filled 2023-11-16 (×7): qty 1

## 2023-11-16 MED ORDER — POTASSIUM CHLORIDE CRYS ER 20 MEQ PO TBCR: 40.0000 meq | EXTENDED_RELEASE_TABLET | Freq: Once | ORAL | Status: DC

## 2023-11-16 MED ORDER — POTASSIUM CHLORIDE 10 MEQ/100ML IV SOLN
10.0000 meq | INTRAVENOUS | Status: AC
  Administered 2023-11-17 (×6): 10 meq via INTRAVENOUS
  Filled 2023-11-16 (×6): qty 100

## 2023-11-16 MED ORDER — AMIODARONE HCL IN DEXTROSE 360-4.14 MG/200ML-% IV SOLN
60.0000 mg/h | INTRAVENOUS | Status: AC
  Administered 2023-11-16 (×2): 60 mg/h via INTRAVENOUS

## 2023-11-16 MED ORDER — CIPROFLOXACIN IN D5W 400 MG/200ML IV SOLN
400.0000 mg | Freq: Two times a day (BID) | INTRAVENOUS | Status: DC
  Administered 2023-11-16 – 2023-11-22 (×12): 400 mg via INTRAVENOUS
  Filled 2023-11-16 (×14): qty 200

## 2023-11-16 MED ORDER — MAGNESIUM OXIDE -MG SUPPLEMENT 400 (240 MG) MG PO TABS
400.0000 mg | ORAL_TABLET | Freq: Every day | ORAL | Status: DC
  Administered 2023-11-16: 400 mg via ORAL
  Filled 2023-11-16: qty 1

## 2023-11-16 MED ORDER — AMIODARONE HCL IN DEXTROSE 360-4.14 MG/200ML-% IV SOLN
30.0000 mg/h | INTRAVENOUS | Status: DC
  Administered 2023-11-16 – 2023-11-18 (×5): 30 mg/h via INTRAVENOUS
  Filled 2023-11-16 (×6): qty 200

## 2023-11-16 NOTE — Transfer of Care (Signed)
 Immediate Anesthesia Transfer of Care Note  Patient: [name]  Procedure(s) Performed: TRANSESOPHAGEAL ECHOCARDIOGRAM CARDIOVERSION  Patient Location: PACU and Cath Lab  Anesthesia Type:MAC and General  Level of Consciousness: awake, drowsy, patient cooperative, and responds to stimulation  Airway & Oxygen Therapy: Patient Spontanous Breathing and Patient connected to nasal cannula oxygen  Post-op Assessment: Report given to RN and Post -op Vital signs reviewed and stable  Post vital signs: Reviewed and stable  Last Vitals:  Vitals Value Taken Time  BP 103/60   Temp    Pulse    Resp    SpO2      Last Pain:  Vitals:   11/16/23 1518  TempSrc:   PainSc: 0-No pain         Complications: No notable events documented.

## 2023-11-16 NOTE — Progress Notes (Signed)
 CSW reviewed SDOH needs and added food, housing, and social connection resources to patients AVS.   Johnnette Gourd, MSW, LCSWA Transitions of Care [phone]

## 2023-11-16 NOTE — Evaluation (Signed)
 Occupational Therapy Evaluation Patient Details Name: [name] MRN: [id number] DOB: 16-Jan-1951 Today's Date: 11/16/2023   History of Present Illness   [name] is a 73 y.o. female admitted 11/14/23 with SOB and found to have atrial tachycardia r/o Afib with RVR vs Multifocal Atrial tachycardia acute on chronic CHF. 3/18 to have TEE and potential cardioversion this day. PHMx: NICM following treatment of breast cancer,  EF of 60-65%, COPD on 3L O2, fibromyalgia, chronic SI joint pain, mitral regurgitaiton, SVT , GERD, HLD, IBS, osteoporosis, recent concussion and bimalleolar ankle fracture s/p ORIF 2/11 and to SNF until 3/13.     Clinical Impressions This 73 yo female admitted with above presents to acute OT with recent D/C from SNF with to have follow up [facility] services. She went home alone at that time. Currently can sit herself up in the bed without A however her HR already high in 120's goes up to 140's when doing this. She has limited endurance for activity due to this as well. She remains on her 3 L of O2 that she also uses at home. Depending on what TEE shows and if cardioversion is success will help to determine [facility] v. patient will benefit from continued inpatient follow up therapy, <3 hours/day. We will continue to follow.      If plan is discharge home, recommend the following:   A little help with walking and/or transfers;A lot of help with bathing/dressing/bathroom;Assistance with cooking/housework;Assist for transportation;Help with stairs or ramp for entrance     Functional Status Assessment   Patient has had a recent decline in their functional status and demonstrates the ability to make significant improvements in function in a reasonable and predictable amount of time.     Equipment Recommendations   None recommended by OT      Precautions/Restrictions   Precautions Precautions: Fall Recall of Precautions/Restrictions: Impaired Restrictions Weight  Bearing Restrictions Per Provider Order: No Other Position/Activity Restrictions: Pt reports when she went back to ortho doc while still at SNF that he referred her back to the PT at the facility to use CAM BOOT or not--pt reports she has not been wearing CAM BOOT since that day.     Mobility Bed Mobility               General bed mobility comments: No physcal issues with long sitting in bed without back support; HR did go from 120's to 140's with just doing this            ADL either performed or assessed with clinical judgement   ADL                                         General ADL Comments: Did not address ADLs today due to high HR (120's at rest, and up to 140's with just long sitting in bed). We did talk about energy conservation (purse lipped breathing, tri-pod propping to breath, using O2 while seated in shower, breaking up tasks she would normally do in succession, not being in a rush.     Vision Patient Visual Report: No change from baseline              Pertinent Vitals/Pain Pain Assessment Pain Assessment: Faces Faces Pain Scale: Hurts little more Pain Location: ankle aching Pain Descriptors / Indicators: Aching Pain Intervention(s): Limited activity within patient's tolerance  Extremity/Trunk Assessment Upper Extremity Assessment Upper Extremity Assessment: Overall WFL for tasks assessed (does have OA)      Communication Communication Communication: No apparent difficulties   Cognition Arousal: Alert Behavior During Therapy: WFL for tasks assessed/performed                                 Following commands: Intact       Cueing  General Comments   Cueing Techniques: Verbal cues  VSS on 3L O2 via [place], which is her baseline requirement.           Home Living Family/patient expects to be discharged to:: Private residence Living Arrangements: Alone Available Help at Discharge: Family;Available  PRN/intermittently (sister and step daughter) Type of Home: House Home Access: Stairs to enter [employer] of Steps: 4 Entrance Stairs-Rails: Right;Left Home Layout: One level     Bathroom Shower/Tub: Producer, television/film/video: Handicapped height     Home Equipment: Grab bars - tub/shower;Cane - quad;Rollator (4 wheels);Shower seat;BSC/3in1   Additional Comments: Pt reports she was recently d/c from a SNF facility (3/13) to her Home with HH.      Prior Functioning/Environment Prior Level of Function : Independent/Modified Independent;Driving;History of Falls (last six months)             Mobility Comments: ModI with rollator. Pt states she occasionally has to bump up her O2 to 4L during gait. Pt reports 3 falls in the last 47mo. One in February that resulted in injury including concussion and R ankle fx. ADLs Comments: Independent with ADLs/IADLs. Pt reports sleeping in a recliner chair at home d/t orthopnea. Pt drives and manages her own medications.    OT Problem List: Decreased activity tolerance;Impaired balance (sitting and/or standing)   OT Treatment/Interventions: Self-care/ADL training;DME and/or AE instruction;Balance training;Patient/family education;Energy conservation      OT Goals(Current goals can be found in the care plan section)   Acute Rehab OT Goals Patient Stated Goal: to feel better OT Goal Formulation: With patient Time For Goal Achievement: 12/07/23 Potential to Achieve Goals: Good   OT Frequency:  Min 2X/week       AM-PAC OT "6 Clicks" Daily Activity     Outcome Measure Help from another person eating meals?: None Help from another person taking care of personal grooming?: A Little Help from another person toileting, which includes using toliet, bedpan, or urinal?: A Lot Help from another person bathing (including washing, rinsing, drying)?: A Little Help from another person to put on and taking off regular upper body  clothing?: A Little Help from another person to put on and taking off regular lower body clothing?: A Lot 6 Click Score: 17   End of Session    Activity Tolerance:  (limited by increase in HR with minimal activity) Patient left: in bed;with call bell/phone within reach;with bed alarm set  OT Visit Diagnosis: Other abnormalities of gait and mobility (R26.89);Muscle weakness (generalized) (M62.81)                Time: 9562-1308 OT Time Calculation (min): 21 min Charges:  OT General Charges $OT Visit: 1 Visit OT Evaluation $OT Eval Low Complexity: 1 Low  Lindon Romp OT Acute Rehabilitation Services Office [phone]    Evette Georges 11/16/2023, 1:38 PM

## 2023-11-16 NOTE — Discharge Instructions (Addendum)
 [employer] assistance programs Crisis assistance programs  -[facility] Arts development officer. If you are experiencing homelessness in [place], [place], your first point of contact should be Pensions consultant. You can reach Coordinated Entry by calling [phone] ([phone] or by emailing [email or web address] [email or web address].  Community access points: [employer] [phone], [place]) every Tuesday from 9am-10am. [facility] ([address] [place] [address], [place]) every Wednesday from 8am-9am.   -[place] Coordinated Re-entry Marcy Panning: Dial 211 and request. Offers referrals to homeless shelters in the area.    -The [employer] (343)625-3771) offers several services to local families, as funding allows. The Emergency Assistance Program (EAP), which they administer, provides household goods, free food, clothing, and financial aid to people in need in the [facility] area. The EAP program does have some qualification, and counselors will interview clients for financial assistance by written referral only. Referrals need to be made by the Department of Social Services or by other EAP approved human services agencies or charities in the area.  -[facility] of [employer], which can be reached at (667)735-4827, offers emergency assistance programs for those in need of help, such as food, rent assistance, a soup kitchen, shelter, and clothing. They are based in [facility] but provide a number of services to those that qualify for assistance.   [facility] Department of Social Services may be able to offer temporary financial assistance and cash grants for paying rent and utilities, Help may be provided for local county residents who may be experiencing personal crisis when other resources, including government programs, are not available. Call [phone]  -[employer] is a [employer] agency, The organization can offer emergency assistance for paying rent, [employer], utilities, food, household products and furniture. They offer extensive emergency and transitional housing for families, children and single women, and also run a Boy's and [employer]. Thrift Shops, Secondary school teacher, and other aid offered too. [address], [place], [place] [address], [phone]  -[place] Low Income Energy Assistance Program -- This is offered for [facility] families. The federal government created [employer] Program provides a one-time cash grant payment to help eligible low-income families pay their electric and heating bills. [address], [place], [place] [address], [phone]  -[place] Emergency Assistance -- A program offers emergency utility and rent funds for greater [place] area residents. The program can also provide counseling and referrals to charities and government programs. Also provides food and a free meal program that serves lunch Mondays - Saturdays and dinner seven days per week to individuals in the community. [address] [place], [place] [address], [phone]  -[employer] - Offers affordable apartment and housing communities across      [place] and [place]. The low income and seniors can access public housing, rental assistance to qualified applicants, and apply for the section 8 rent subsidy program. Other programs include Chiropractor and Engineer, maintenance. [address] [place], [place] [address], dial [phone].  -The [facility] provides transitional housing to veterans and the disabled. Clients will also access other services too, including assistance in applying for Disability, life skills classes, case management, and assistance in finding permanent housing. [address], [place], [place]  [place] [address], call [phone]  -[facility] [place] Transitional Housing through [facility] is for people who were just  evicted or that are formerly homeless. The non-profit will also help then gain self-sufficiency, find a home or apartment to live in, and also provides information on rent assistance when needed. Phone [phone]  -The [place] [employer] [facility] helps low income, elderly, or disabled residents in seven counties in the [place] ([place], [place], [place], [place], [place], [place], [place], and [place]) save energy and reduce their utility bills by improving energy efficiency. Phone [phone].  -[employer] is located in the [place] [facility] in the [employer], [address], [place], [place] [address]. Parking is in the rear of the building. Phone: [phone]   General Email: info@gsohc .org  [facility] provides free housing counseling assistance in locating affordable rental housing or housing with support services for families and individuals in crisis and the chronically homeless. We provide potential resources for other housing needs like utilities. Our trained counselors also work with clients on budgeting and financial literacy in effort to empower them to take control of their financial situations. [employer] collaborates with homeless service providers and other stakeholders as part of the [employer] COC (Continuum of Care). The (COC) is a regional/local planning body that coordinates housing and services funding for homeless families and individuals. The role of [facility] in the COC is through housing counseling to work with people we serve on diversion strategies for those that are at imminent risk of becoming homeless. We also work with the Coordinated Assessment/Entry Specialist who attempts to find temporary solutions and/or connects the people  to Housing First, Rapid Re-housing or transitional housing programs. Our Homelessness Prevention Housing Counselors meet with clients on business days (Monday-Fridays, except scheduled holidays) from 8:30 am to 4:30 pm.  Food pantry and assistance -[facility]: [address], [place] [address]. Phone [phone]  -[facility]: [address], [place], [place] [address]. [phone].  -[facility]: has the purpose of visiting the sick and shut-ins and provide for needs in the surrounding communities. Call (416)864-0369. Email: stpaulbcinc@gmail .com This program provides: Food box for seniors, Financial assistance, Food to meet basic nutritional needs.  -Meals on Wheels with Senior Resources: [facility] residents age 66 and over who are homebound and unable to obtain and prepare a nutritious meal for themselves are eligible for this service. There may be a waiting list in certain parts of [facility] if the route in that area is full. If you are in [facility] and [place] call [phone] to register. For all other areas call [phone] to register.  -Greater Dietitian: https://findfood.BargainContractor.[email or web address]  Dealer.org  [facility])   M-F 8a-3p [address] [place], [place] [address] [phone] Services include: laundry, barbering, support groups, case management, phone & computer access, showers, AA/NA mtgs, mental health/substance abuse nurse, job skills class, disability information, VA assistance, spiritual classes, etc. Winter Shelter available when temperatures are less than 32 degrees.   HOMELESS SHELTERS [facility] at [facility]- Call [phone] ext. 347 or ext. 336. Located at [address], [place], [place] [address]  [facility] Mens Shelter- Call [phone]. Located at [address], [place] [address].  [facility] [employer]. Call [phone]. Office located at [address] [place] [address].  [facility] Family Housing through [employer] [phone].  [facility]- Call [phone]. Located at [address] [place], [place], [place] [address].  Room at the Inn-For Pregnant mothers. Call [phone]. Located at [address] [place], [address].  [facility]-For men in [place]. Call [phone]. [address]-Shelter in [place]. Call [phone].  Home of [employer] for [employer] [phone]. Office located at [address], [place], [address].  [employer] be agreeable to help with chores. Call [phone] ext. 5000.  Men's: [address] [place], [place] [address]. Women's: [facility]  [address] [place], [place] [address]  Crisis Services [facility] Mobile Crisis Management- [phone]  [facility] [address], [place], [place] [address]. Phone: [phone]  Social Connections   PACE (Adult Program)  - Address: [address] [place], [place] [address] - General office #:  [phone] - Enrollment Phone #: [phone]  Institute of Aging  - Senior Friendship Line: call toll free, available 24 hours a day, at [phone]    Bath 2-1-1 is another useful way to locate resources in the community. Visit ShedSizes.ch to find service information online. If you need additional assistance, 2-1-1 Referral Specialists are available 24 hours ---------------------------------------------------  Information on my medicine - ELIQUIS (apixaban)  Why was Eliquis prescribed for you? Eliquis was prescribed for you to reduce the risk of a blood clot forming that can cause a stroke if you have a medical condition called atrial fibrillation (a type of irregular heartbeat).  What do You need to know  about Eliquis ? Take your Eliquis TWICE DAILY - one tablet in the morning and one tablet in the evening with or without food. If you have difficulty swallowing the tablet whole please discuss with your pharmacist how to take the medication safely.  Take Eliquis exactly as prescribed by your doctor and DO NOT stop taking Eliquis without talking to the doctor who prescribed the medication.  Stopping may increase your risk of developing a stroke.  Refill your prescription before you run out.  After discharge, you should have regular check-up appointments with your healthcare provider that is prescribing your Eliquis.  In the future your dose may need to be changed if your kidney function or weight changes by a significant amount or as you get older.  What do you do if you miss a dose? If you miss a dose, take it as soon as you remember on the same day and resume taking twice daily.  Do not take more than one dose of ELIQUIS at the same time to make up a missed dose.  Important Safety Information A possible side effect of Eliquis is bleeding. You should call your healthcare provider right away if you experience any of the following: Bleeding from an injury or your nose that does not stop. Unusual colored urine (red or dark brown) or unusual colored stools (red or black). Unusual bruising for unknown reasons. A serious fall or if you hit your head (even if there is no bleeding).  Some medicines may interact with Eliquis and might increase your risk of bleeding or clotting while on Eliquis. To help avoid this, consult your healthcare provider or pharmacist prior to using any new prescription or non-prescription medications, including herbals, vitamins, non-steroidal anti-inflammatory drugs (NSAIDs) and supplements.  This website has more information on Eliquis (apixaban): http://[email or web address]

## 2023-11-16 NOTE — Progress Notes (Signed)
 Progress Note   Patient: [name] ZOX:[id number] DOB: [birth year] DOA: 11/14/2023     2 DOS: the patient was seen and examined on 11/16/2023   Brief hospital course: Meron Bocchino is a 73 year old female with medical history significant for nonischemic cardiomyopathy, mitral regurgitation, SVT, GERD, hyperlipidemia, IBS, recent bimalleolar ankle fracture after fall presented from SNF for evaluation of increased heart rate, shortness of breath for 2 days.  Patient is noted to have atrial tachycardia, started on Cardizem drip admitted to [facility] service for further management.  Cardiology consulted, started amiodarone drip, possible TEE/ cardioversion.   Assessment and Plan: Atrial Flutter. Afib with RVR Multifocal Atrial tachycardia  Hx of SVT, Zio in fall 2024 noted svt  no afib. Continue to monitor on tele progressive unit. Cardiology consultation appreciated. Due to her high [place] Vasc started on Eliquis therapy. Stopped  diltiazem drip, started on amiodarone drip per cardiology. PO Cardizem 120mg  daily. Stopped metoprolol due to low BP. CT chest negative for PE.   Acute on Chronic HFrEF with recovered EF Severe mitral regurgitation H/o non ischemic cardiomyopathy. NICM after treatment of breast cancer, chronic HFrEF with recovery with last EF of 60-65%. Heart failure flare up due to persistent tachycardia Hold Lasix 40 mg iv bid  Monitor daily renal function. TEE planned for today.  Hypokalemia: Hypomagnesemia: In the setting of IV diuresis. Oral and IV replacement ordered. Continue to monitor and replace accordingly.   COPD on chronic oxygen No exacerbation, she is on baseline home O2. Resumed home inhalation medication  Continue prn nebs    Abdominal discomfort, nausea: Pyelonephritis on CT abdomen Urinalysis ordered yesterday, not collected yet. Start ciprofloxacin 400 mg twice daily. Due to norovirus outbreak in the facility, Norovirus panel ordered. Continue  PPI, antiemetics.   Hyperlipidemia Continue statin    Osteoporosis S/p recent repair for bimalleolar ankle fracture after a fall PT OT evaluation Nursing supportive care  Fall, aspiration precautions  Severe malnutrition BMI 15.16 Will get dietitian evaluation. Encourage oral diet, supplements.     Out of bed to chair. Incentive spirometry.  Diet:  Diet Orders (From admission, onward)     Start     Ordered   11/16/23 0001  Diet NPO time specified  Diet effective midnight       Comments: Patient to remain NPO until fully awake following the TEE.   11/15/23 1625           DVT prophylaxis:  apixaban (ELIQUIS) tablet 5 mg  Level of care: Progressive.   Code Status: Full Code  Subjective: Patient is seen and examined today morning. She is lying in bed, on 2L supplemental o2. Has nausea. HR 120 on monitor, maxed on Cardizem per RN.  Physical Exam: Vitals:   11/16/23 0827 11/16/23 1023 11/16/23 1110 11/16/23 1426  BP: 118/80 121/74 110/76   Pulse:   84   Resp:   20   Temp: 98.8 F (37.1 C)  98.5 F (36.9 C)   TempSrc: Oral  Oral   SpO2:   100% 100%  Weight:      Height:        General - Elderly Caucasian female, mild secretory distress HEENT - PERRLA, EOMI, atraumatic head, non tender sinuses. Lung - Clear, basal rales, rhonchi, no wheezes. Heart - S1, S2 heard, no murmurs, rubs, no pedal edema. Abdomen - Soft, non tender, nondistended, bowel sounds good Neuro - Alert, awake and oriented x 3, non focal exam. Skin - Warm and dry.  Data  Reviewed:      Latest Ref Rng & Units 11/16/2023    6:31 AM 11/15/2023    4:00 AM 11/15/2023    1:20 AM  CBC  WBC 4.0 - 10.5 K/uL 7.9  9.0  10.2   Hemoglobin 12.0 - 15.0 g/dL 84.1  9.0  9.3   Hematocrit 36.0 - 46.0 % 31.1  28.5  29.6   Platelets 150 - 400 K/uL 708  575  475       Latest Ref Rng & Units 11/16/2023   11:33 AM 11/16/2023    6:31 AM 11/15/2023    4:00 AM  BMP  Glucose 70 - 99 mg/dL  91  324   BUN 8 -  23 mg/dL  9  8   Creatinine 4.01 - 1.00 mg/dL  0.27  2.53   Sodium 664 - 145 mmol/L  137  138   Potassium 3.5 - 5.1 mmol/L 2.8  2.9  4.4   Chloride 98 - 111 mmol/L  89  95   CO2 22 - 32 mmol/L  34  28   Calcium 8.9 - 10.3 mg/dL  8.6  8.5    VAS [place] LOWER EXTREMITY VENOUS (DVT) (ONLY [facility] & [facility]) Result Date: 11/15/2023  Lower Venous DVT Study Patient Name:  [name]  Date of Exam:   11/14/2023 Medical Rec #: [id number]          Accession #:    [id number] Date of Birth: Nov 03, 1950         Patient Gender: F Patient Age:   56 years Exam Location:  [facility] Procedure:      VAS [place] LOWER EXTREMITY VENOUS (DVT) Referring Phys: Molly Maduro PATERSON --------------------------------------------------------------------------------  Indications: Edema, and Patient tachycardic on arrival with pulse between 190-220, now post Cardizem rate decreased to 140s-160s..  Risk Factors: Right ORIF of ankle 10/12/23. Limitations: Adult undergarment shadowing in bilateral groins Comparison Study: No prior study on file Performing Technologist: Sherren Kerns RVS  Examination Guidelines: A complete evaluation includes B-mode imaging, spectral Doppler, color Doppler, and power Doppler as needed of all accessible portions of each vessel. Bilateral testing is considered an integral part of a complete examination. Limited examinations for reoccurring indications may be performed as noted. The reflux portion of the exam is performed with the patient in reverse Trendelenburg.  +---------+---------------+---------+-----------+----------+--------------+ RIGHT    CompressibilityPhasicitySpontaneityPropertiesThrombus Aging +---------+---------------+---------+-----------+----------+--------------+ CFV      Full           Yes      No                                  +---------+---------------+---------+-----------+----------+--------------+ SFJ      Full                                                         +---------+---------------+---------+-----------+----------+--------------+ FV Prox  Full                                                        +---------+---------------+---------+-----------+----------+--------------+ FV Mid   Full                                                        +---------+---------------+---------+-----------+----------+--------------+  FV DistalFull                                                        +---------+---------------+---------+-----------+----------+--------------+ PFV      Full                                                        +---------+---------------+---------+-----------+----------+--------------+ POP      Full           No       Yes                                 +---------+---------------+---------+-----------+----------+--------------+ PTV      Full                                                        +---------+---------------+---------+-----------+----------+--------------+ PERO     Full                                                        +---------+---------------+---------+-----------+----------+--------------+   +----+---------------+---------+-----------+----------+--------------+ LEFTCompressibilityPhasicitySpontaneityPropertiesThrombus Aging +----+---------------+---------+-----------+----------+--------------+ CFV Full           No       Yes                                 +----+---------------+---------+-----------+----------+--------------+ SFJ Full                                                        +----+---------------+---------+-----------+----------+--------------+     Summary: RIGHT: - There is no evidence of deep vein thrombosis in the lower extremity.  - No cystic structure found in the popliteal fossa. interstitial edema noted throughout  LEFT: - No evidence of common femoral vein obstruction.   *See table(s) above for measurements and observations. Electronically  signed by Gerarda Fraction on 11/15/2023 at 5:28:30 PM.    Final    ECHOCARDIOGRAM COMPLETE Result Date: 11/15/2023    ECHOCARDIOGRAM REPORT   Patient Name:   ANGELIKI MATES Date of Exam: 11/15/2023 Medical Rec #:  [id number]         Height:       60.0 in Accession #:    [id number]        Weight:       92.0 lb Date of Birth:  03-Sep-1950        BSA:          1.342 m Patient Age:    72 years          BP:  135/93 mmHg Patient Gender: F                 HR:           133 bpm. Exam Location:  Inpatient Procedure: 2D Echo, Color Doppler and Cardiac Doppler (Both Spectral and Color            Flow Doppler were utilized during procedure). Indications:    Atrial fibrillation  History:        Patient has prior history of Echocardiogram examinations.                 Cardiomyopathy, COPD, Arrythmias:Tachycardia; Risk                 Factors:Former Smoker.  Sonographer:    Lamont Snowball Referring Phys: [id number] SARA-MAIZ A THOMAS IMPRESSIONS  1. Left ventricular ejection fraction, by estimation, is 60 to 65%. The left ventricle has normal function. The left ventricle has no regional wall motion abnormalities. Left ventricular diastolic function could not be evaluated.  2. Right ventricular systolic function is normal. The right ventricular size is normal. There is normal pulmonary artery systolic pressure. The estimated right ventricular systolic pressure is 35.8 mmHg.  3. Left atrial size was severely dilated.  4. Right atrial size was severely dilated.  5. Moderate pericardial effusion. The pericardial effusion is posterior to the left ventricle. There is no evidence of cardiac tamponade.  6. The mitral valve is abnormal. Severe mitral valve regurgitation. No evidence of mitral stenosis.  7. The aortic valve is tricuspid. There is mild calcification of the aortic valve. Aortic valve regurgitation is not visualized. Aortic valve sclerosis/calcification is present, without any evidence of aortic stenosis.  8. Aortic  dilatation noted. There is mild dilatation of the ascending aorta, measuring 41 mm.  9. The inferior vena cava is dilated in size with <50% respiratory variability, suggesting right atrial pressure of 15 mmHg. FINDINGS  Left Ventricle: Left ventricular ejection fraction, by estimation, is 60 to 65%. The left ventricle has normal function. The left ventricle has no regional wall motion abnormalities. The left ventricular internal cavity size was normal in size. There is  no left ventricular hypertrophy. Left ventricular diastolic function could not be evaluated due to atrial fibrillation. Left ventricular diastolic function could not be evaluated. Right Ventricle: The right ventricular size is normal. No increase in right ventricular wall thickness. Right ventricular systolic function is normal. There is normal pulmonary artery systolic pressure. The tricuspid regurgitant velocity is 2.28 m/s, and  with an assumed right atrial pressure of 15 mmHg, the estimated right ventricular systolic pressure is 35.8 mmHg. Left Atrium: Left atrial size was severely dilated. Right Atrium: Right atrial size was severely dilated. Pericardium: A moderately sized pericardial effusion is present. The pericardial effusion is posterior to the left ventricle. There is no evidence of cardiac tamponade. Mitral Valve: The mitral valve is abnormal. Severe mitral valve regurgitation, with centrally-directed jet. No evidence of mitral valve stenosis. MV peak gradient, 10.2 mmHg. The mean mitral valve gradient is 4.3 mmHg. Tricuspid Valve: The tricuspid valve is normal in structure. Tricuspid valve regurgitation is mild . No evidence of tricuspid stenosis. Aortic Valve: The aortic valve is tricuspid. There is mild calcification of the aortic valve. Aortic valve regurgitation is not visualized. Aortic valve sclerosis/calcification is present, without any evidence of aortic stenosis. Aortic valve peak gradient measures 7.8 mmHg. Pulmonic Valve:  The pulmonic valve was normal in structure. Pulmonic valve regurgitation is trivial. No evidence of  pulmonic stenosis. Aorta: Aortic dilatation noted. There is mild dilatation of the ascending aorta, measuring 41 mm. Venous: The inferior vena cava is dilated in size with less than 50% respiratory variability, suggesting right atrial pressure of 15 mmHg. IAS/Shunts: No atrial level shunt detected by color flow Doppler.  LEFT VENTRICLE PLAX 2D LVIDd:         4.60 cm LVIDs:         3.00 cm LV PW:         1.30 cm LV IVS:        0.60 cm LVOT diam:     1.90 cm LV SV:         50 LV SV Index:   37 LVOT Area:     2.84 cm  RIGHT VENTRICLE          IVC RV Basal diam:  3.10 cm  IVC diam: 2.10 cm LEFT ATRIUM             Index        RIGHT ATRIUM           Index LA diam:        4.50 cm 3.35 cm/m   RA Area:     20.90 cm LA Vol (A2C):   63.4 ml 47.25 ml/m  RA Volume:   51.50 ml  38.38 ml/m LA Vol (A4C):   57.9 ml 43.15 ml/m LA Biplane Vol: 64.8 ml 48.29 ml/m  AORTIC VALVE AV Area (Vmax): 2.54 cm AV Vmax:        139.67 cm/s AV Peak Grad:   7.8 mmHg LVOT Vmax:      125.33 cm/s LVOT Vmean:     79.300 cm/s LVOT VTI:       0.175 m  AORTA Ao Root diam: 3.70 cm Ao Asc diam:  4.10 cm MITRAL VALVE                 TRICUSPID VALVE MV Area (PHT): 4.79 cm      TR Peak grad:   20.8 mmHg MV Area VTI:   2.55 cm      TR Vmax:        228.00 cm/s MV Peak grad:  10.2 mmHg MV Mean grad:  4.3 mmHg      SHUNTS MV Vmax:       1.59 m/s      Systemic VTI:  0.17 m MV Vmean:      94.9 cm/s     Systemic Diam: 1.90 cm MV Decel Time: 158 msec MR Peak grad:   115.3 mmHg MR Mean grad:   86.0 mmHg MR Vmax:        537.00 cm/s MR Vmean:       445.5 cm/s MR PISA:        4.02 cm MR PISA Radius: 0.80 cm MV E velocity: 149.67 cm/s Arvilla Meres MD Electronically signed by Arvilla Meres MD Signature Date/Time: 11/15/2023/11:11:39 AM    Final    CT ABDOMEN PELVIS W CONTRAST Result Date: 11/15/2023 CLINICAL DATA:  Acute abdominal pain EXAM: CT ABDOMEN AND  PELVIS WITH CONTRAST TECHNIQUE: Multidetector CT imaging of the abdomen and pelvis was performed using the standard protocol following bolus administration of intravenous contrast. RADIATION DOSE REDUCTION: This exam was performed according to the departmental dose-optimization program which includes automated exposure control, adjustment of the mA and/or kV according to patient size and/or use of iterative reconstruction technique. CONTRAST:  50mL OMNIPAQUE IOHEXOL 350 MG/ML SOLN COMPARISON:  None Available.  FINDINGS: Lower Chest: Small pleural effusions with bibasilar atelectasis. Hepatobiliary: Normal hepatic contours. No intra- or extrahepatic biliary dilatation. Status post cholecystectomy. Pancreas: Normal pancreas. No ductal dilatation or peripancreatic fluid collection. Spleen: Normal. Adrenals/Urinary Tract: Poor visualization of the adrenal glands. Heterogeneous contrast enhancement of the interpolar right kidney. Normal left kidney. The urinary bladder is normal for degree of distention Stomach/Bowel: There is no hiatal hernia. Normal duodenal course and caliber. No small bowel dilatation or inflammation. No focal colonic abnormality. Normal appendix. Vascular/Lymphatic: There is calcific atherosclerosis of the abdominal aorta. No lymphadenopathy. Reproductive: Normal uterus. No adnexal mass. Other: Small volume left upper quadrant free fluid. Musculoskeletal: Dextroscoliosis with apex at L2. IMPRESSION: 1. Heterogeneous contrast enhancement of the interpolar right kidney, concerning for pyelonephritis. Correlate with urinalysis. 2. Small pleural effusions with bibasilar atelectasis. 3. Small amount of free fluid in the left upper quadrant. Electronically Signed   By: Deatra Robinson M.D.   On: 11/15/2023 02:20   CT Angio Chest PE W and/or Wo Contrast Result Date: 11/14/2023 CLINICAL DATA:  Shortness of breath.  PE suspected EXAM: CT ANGIOGRAPHY CHEST WITH CONTRAST TECHNIQUE: Multidetector CT imaging of  the chest was performed using the standard protocol during bolus administration of intravenous contrast. Multiplanar CT image reconstructions and MIPs were obtained to evaluate the vascular anatomy. RADIATION DOSE REDUCTION: This exam was performed according to the departmental dose-optimization program which includes automated exposure control, adjustment of the mA and/or kV according to patient size and/or use of iterative reconstruction technique. CONTRAST:  50mL OMNIPAQUE IOHEXOL 350 MG/ML SOLN COMPARISON:  Same day chest radiograph and PET/CT 12/27/1998 FINDINGS: Cardiovascular: Cardiomegaly. Small pericardial effusion. Negative for acute pulmonary embolism. Aortic and coronary artery atherosclerotic calcification. Mediastinum/Nodes: Trachea and esophagus are unremarkable. Shotty mediastinal lymph nodes are likely reactive. Lungs/Pleura: Respiratory motion obscures detail. Emphysema. Small right-greater-than-left pleural effusions and compressive atelectasis. Mild diffuse bronchial wall thickening. Question interlobular septal thickening though evaluation is limited by motion artifact. No pneumothorax. Upper Abdomen: Low-density free fluid in the left upper quadrant. Musculoskeletal: No acute fracture. Postoperative changes both breasts. Review of the MIP images confirms the above findings. IMPRESSION: 1. Negative for acute pulmonary embolism. 2. Question interstitial pulmonary edema. Small right-greater-than-left pleural effusions and compressive atelectasis. 3. Cardiomegaly with small pericardial effusion. 4. Low-density free fluid in the left upper quadrant. CT abdomen pelvis with IV contrast is recommended for further evaluation. Aortic Atherosclerosis (ICD10-I70.0) and Emphysema (ICD10-J43.9). Electronically Signed   By: Minerva Fester M.D.   On: 11/14/2023 19:08    Family Communication: Discussed with patient, she understand and agree. All questions answered.  Disposition: Status is:  Inpatient Remains inpatient appropriate because: atrial tachycardia, IV cardizem gtt, cardiology work up.  Planned Discharge Destination: Home with Home Health     Time spent: 39 minutes  Author: Marcelino Duster, MD 11/16/2023 2:42 PM Secure chat 7am to 7pm For on call review [email or web address].

## 2023-11-16 NOTE — Interval H&P Note (Signed)
 History and Physical Interval Note:  11/16/2023 2:26 PM  [name]  has presented today for surgery, with the diagnosis of MR, f/u pericardial effusion.  The various methods of treatment have been discussed with the patient and family. After consideration of risks, benefits and other options for treatment, the patient has consented to  Procedure(s): TRANSESOPHAGEAL ECHOCARDIOGRAM (N/A) CARDIOVERSION (N/A) as a surgical intervention.  The patient's history has been reviewed, patient examined, no change in status, stable for surgery.  I have reviewed the patient's chart and labs.  Questions were answered to the patient's satisfaction.     Ernisha Sorn

## 2023-11-16 NOTE — Evaluation (Addendum)
 Physical Therapy Evaluation Patient Details Name: [name] MRN: [id number] DOB: 1951-02-26 Today's Date: 11/16/2023  History of Present Illness  [name] is a 73 y.o. female admitted 11/14/23 with SOB and found to have atrial tachycardia r/o Afib with RVR vs Multifocal Atrial tachycardia acute on chronic CHF. PHMx: NICM following treatment of breast cancer,  EF of 60-65%, COPD on 3L O2, fibromyalgia, chronic SI joint pain, mitral regurgitaiton, SVT , GERD, HLD, IBS, osteoporosis, recent concussion and bimalleolar ankle fracture s/p ORIF 2/11.   Clinical Impression  Pt admitted with above diagnosis. She was recently d/c home from a SNF with [facility] services. Pt reports she is modI with functional mobility using a rollator, independent with ADLs, and drives. She resides alone in a one story house with 5 STE and BHR. Pt currently with functional limitations due to the deficits listed below (see PT Problem List). Examination limited by pt c/o dizziness and nausea although BP stable. Unable to progress OOB. She performed bed mobility with supervision. She sat EOB for ~64mins with neutral posture, BUE support, and tolerated resisted strength assessment. Pt will benefit from acute skilled PT to increase her independence and safety with mobility to allow discharge. Based on today's performance and lack of consistent family support recommend post-acute rehab <3 hours/day of therapy.        If plan is discharge home, recommend the following: A lot of help with walking and/or transfers;A lot of help with bathing/dressing/bathroom;Help with stairs or ramp for entrance;Assist for transportation;Assistance with cooking/housework   Can travel by private vehicle   No    Equipment Recommendations None recommended by PT (Pt already has DME)  Recommendations for Other Services       Functional Status Assessment Patient has had a recent decline in their functional status and demonstrates the ability  to make significant improvements in function in a reasonable and predictable amount of time.     Precautions / Restrictions Precautions Precautions: Fall Recall of Precautions/Restrictions: Impaired Restrictions Weight Bearing Restrictions Per Provider Order: No      Mobility  Bed Mobility Overal bed mobility: Needs Assistance Bed Mobility: Supine to Sit, Sit to Supine, Rolling Rolling: Supervision, Used rails   Supine to sit: HOB elevated, Supervision Sit to supine: HOB elevated, Supervision   General bed mobility comments: Pt took increased time to sit up on the left side of bed. She was able to scoot fwd with BUE support on bed. Pt reported dizziness and nausea upon sitting up, re-assessed vitals and they were stable. Returned to supine, pt brought BLE back into bed and lowered back to bed. She rolled R/L in order for soiled chuck pad to be removed.    Transfers                   General transfer comment: Pt declined attempt d/t nausea and dizziness despite VSS.    Ambulation/Gait                  Stairs            Wheelchair Mobility     Tilt Bed    Modified Rankin (Stroke Patients Only)       Balance Overall balance assessment: Needs assistance Sitting-balance support: No upper extremity supported, Feet supported, Bilateral upper extremity supported Sitting balance-Leahy Scale: Fair Sitting balance - Comments: Pt sat EOB for ~93mins with supervision. She required BUE support on bed to scoot or when LE strength was being assessed.  Pertinent Vitals/Pain Pain Assessment Pain Assessment: 0-10 Pain Score: 3  Pain Location: Chest and L arm at IV site Pain Descriptors / Indicators: Burning, Aching, Discomfort, Pressure Pain Intervention(s): Monitored during session, Limited activity within patient's tolerance    Home Living Family/patient expects to be discharged to:: Private  residence Living Arrangements: Alone Available Help at Discharge: Family;Available PRN/intermittently (Sister and Daughter) Type of Home: House Home Access: Stairs to enter Entrance Stairs-Rails: Doctor, general practice of Steps: 4   Home Layout: One level Home Equipment: Grab bars - tub/shower;Cane - quad;Rollator (4 wheels);Shower seat;BSC/3in1 Additional Comments: Pt reports she was recently d/c from a SNF facility to her Home with [facility].    Prior Function Prior Level of Function : Independent/Modified Independent;Driving;History of Falls (last six months)             Mobility Comments: ModI with rollator. Pt states she occasionally has to bump up her O2 to 4L during gait. Pt reports 3 falls in the last 67mo. One in February that resulted in injury including concussion and R ankle fx. ADLs Comments: Independent with ADLs/IADLs. Pt reports sleeping in a recliner chair at home d/t orthopnea. Pt drives and manages her own medications.     Extremity/Trunk Assessment   Upper Extremity Assessment Upper Extremity Assessment: Defer to OT evaluation    Lower Extremity Assessment Lower Extremity Assessment: Overall WFL for tasks assessed (Pt demonstrated WFL AROM while supine in bed. Seated MMT revealed grossly 4/5 B.)    Cervical / Trunk Assessment Cervical / Trunk Assessment: Normal  Communication   Communication Communication: No apparent difficulties    Cognition Arousal: Alert Behavior During Therapy: WFL for tasks assessed/performed   PT - Cognitive impairments: No apparent impairments                       PT - Cognition Comments: Pt A,Ox4 Following commands: Intact       Cueing Cueing Techniques: Verbal cues     General Comments General comments (skin integrity, edema, etc.): Pt greeted on 3L O2 via , which is her baseline requirement. BP stable throughout session. HR with atrial tachycardia, ranging from 100-150bpm during session.      Exercises     Assessment/Plan    PT Assessment Patient needs continued PT services  PT Problem List Decreased activity tolerance;Decreased balance;Decreased mobility;Cardiopulmonary status limiting activity;Pain       PT Treatment Interventions DME instruction;Gait training;Stair training;Functional mobility training;Therapeutic activities;Therapeutic exercise;Balance training;Patient/family education    PT Goals (Current goals can be found in the Care Plan section)  Acute Rehab PT Goals Patient Stated Goal: Return Home PT Goal Formulation: With patient Time For Goal Achievement: 11/30/23 Potential to Achieve Goals: Good    Frequency Min 2X/week     Co-evaluation               AM-PAC PT "6 Clicks" Mobility  Outcome Measure Help needed turning from your back to your side while in a flat bed without using bedrails?: A Little Help needed moving from lying on your back to sitting on the side of a flat bed without using bedrails?: A Little Help needed moving to and from a bed to a chair (including a wheelchair)?: A Lot Help needed standing up from a chair using your arms (e.g., wheelchair or bedside chair)?: A Lot Help needed to walk in hospital room?: A Lot Help needed climbing 3-5 steps with a railing? : A Lot 6 Click Score: 14  End of Session   Activity Tolerance: Other (comment) (Patient limited by nausea) Patient left: in bed;with call bell/phone within reach;with bed alarm set Nurse Communication: Mobility status;Other (comment) (Pt's nausea and request for medication to alleviate.) PT Visit Diagnosis: History of falling (Z91.81);Other abnormalities of gait and mobility (R26.89);Difficulty in walking, not elsewhere classified (R26.2);Pain Pain - Right/Left:  (Both) Pain - part of body:  (Chest)    Time: 1610-9604 PT Time Calculation (min) (ACUTE ONLY): 26 min   Charges:   PT Evaluation $PT Eval Moderate Complexity: 1 Mod   PT General Charges $$ ACUTE PT  VISIT: 1 Visit         Cheri Guppy, PT, DPT Acute Rehabilitation Services Office: [phone] Secure Chat Preferred  Richardson Chiquito 11/16/2023, 1:28 PM

## 2023-11-16 NOTE — H&P (View-Only) (Signed)
 Patient Name: [name] Date of Encounter: 11/16/2023 [facility] Cardiologist: Rollene Rotunda, MD   Interval Summary  .    Went back into atrial fibrillation with RVR at about 7 pm. She feels like her heart is racing and continues to have shortness of breath. Denies having chest pain.  Vital Signs .    Vitals:   11/16/23 0440 11/16/23 0510 11/16/23 0540 11/16/23 0610  BP:  (!) 110/58 98/74 115/79  Pulse:      Resp: 16 (!) 21 19   Temp:      TempSrc:      SpO2:      Weight:      Height:        Intake/Output Summary (Last 24 hours) at 11/16/2023 0714 Last data filed at 11/15/2023 2300 Gross per 24 hour  Intake 175.34 ml  Output 1250 ml  Net -1074.66 ml      11/16/2023    4:09 AM 11/14/2023   12:38 PM 10/12/2023    7:16 AM  Last 3 Weights  Weight (lbs) 77 lb 9.6 oz 92 lb 92 lb  Weight (kg) 35.2 kg 41.731 kg 41.731 kg      Telemetry/ECG     Went into atrial fibrillation with RVR at about 7pm. Rate slowly went down over night but increased again at around 6am rates are currently in the 140's- Personally Reviewed  Physical Exam .   GEN: No acute distress.   Neck: 3+ JVD Cardiac:  Irregularly irregular rhythm, tachycardic at a rate of about 140, systolic ejection murmur Respiratory: Bibasilar crackles and wheezing heard through out the lungs Rachael: No edema  Assessment & Plan .     [name] is a 73 y.o. female with a hx of Nonischemic cardiomyopathy, paroxysmal atrial fibrillation, SVT, hyperlipidemia, B12 deficiency, COPD, fibromyalgia, GERD, and breast cancer who is being seen 11/15/2023 for the evaluation of atrial fibrillation at the request of sreeram Narendranath MD.   Acute heart failure with improved ejection fraction Severe mitral regurgitation (previously mild on echo in 11/2022) Moderate pericardial effusion An echo was done on 11/15/2023 and showed an LVEF of 60 to 65%, no regional wall motion abnormalities, normal RV systolic  function, severely dilated left atrium, severely dilated right atrium, moderate pericardial effusion with no evidence of tamponade, severe mitral regurgitation, dilated ascending aorta measuring 41 mm, right atrial pressure was estimated at 15 mmHg. -Was previously started on IV Lasix 40 mg twice daily.  On 11/15/2023 creatinine 0.70, potassium decreased at 2.9, sodium 137, Is net out 1.075L. Continues to have crackles - Stop  IV lasix 40 mg twice daily - The hospitalist ordered potassium. - Will recheck potassium at noon today. - TEE is scheduled for today to assess MR. - May consider cardioversion after TEE if she is still in atrial flutter.     Paroxysmal atrial flutter CHA2DS2-VASc 6.  CHF 1, vascular disease 1, female 1, age 57, Prior stroke 2. -Was initially found during COPD exacerbation on 11/2022. - Was previously started on IV Cardizem 10 mg/h and Lopressor 25 mg daily. Was previously rate controlled the rate at 1230 was 73 and blood pressure was 104/6. Went back into atrial flutter with RVR at about 7 pm on 11/15/23. After several hours appeared to be better rate controled but about 6 am to current time was in atrial flutter with RVR with rates in 130's and 140's. - TSH normal at 2.991 - Continue Eliquis 5 mg twice daily. -  Continue Oral Cardizem 120 mg daily.  - Stop metoprolol tartrate to 25 mg twice daily. Due to low blood pressures and possible COPD exacerbation. - Started IV amiodarone with bolus.    Coronary artery disease - CAD was seen on pulmonary CT on 11/14/2023 - Start atorvastatin 40 mg daily   Hyperlipidemia - LDL on 08/30/2023 was 160 - LDL on 11/16/23 was 131. Goal is less than 70. - Start atorvastatin 40 mg daily.   Hypokalemia - The hospitalist ordered potassium replenishment. -- Stop  IV lasix 40 mg twice daily. Will reassess following TEE. - The hospitalist ordered potassium. - Will recheck potassium at noon today   Normocytic anemia -Hemoglobin on  11/15/2023 was 9.0. Today it improved to 10.1.  Baseline hemoglobin appears to be between 12 and 14. - Consider Iron labs and FOBT - Management per primary    COPD - likely contributing to shortness of breath. - Management per primary  Management per primary GERD Fibromyalgia  For questions or updates, please contact [facility] Please consult [email or web address] for contact info under        Signed, Arabella Merles, PA-C    I have personally seen and examined the patient.  My HPI, Exam, and assessment and plan are below, independent of the NPP above.  [name] is a 73 year old female with atrial fibrillation and COPD who presents with severe mitral regurgitation.   She has severe mitral regurgitation, likely secondary to atrial functional causes. Her frailty and multiple comorbidities make her a poor candidate for surgical evaluation.  She experienced atrial fibrillation with a rapid ventricular response overnight, with heart rates ranging from 100 to 130 beats per minute. Despite medication management with diltiazem and metoprolol, her treatment was limited due to hypotension, with blood pressure recorded at 98/74 mmHg. She has worsening heart racing and shortness of breath. No lower extremity edema is noted.  Exam notable for  VITALS: BP- 98/74, HR 130 MEASUREMENTS: BMI- 15.0.  She is thin and frail GENERAL: Mild distress, no tachypnea. NECK: Jugular venous distension. CHEST: Wheezing in both lung fields. CARDIOVASCULAR: Irregular tachycardia, systolic murmur. EXTREMITIES: No lower extremity edema.  Atrial Fibrillation with Rapid Ventricular Response Paroxysmal atrial flutter or atrial fibrillation with rapid ventricular response. Limited use of diltiazem and metoprolol due to hypotension. Symptoms include palpitations and dyspnea. Telemetry shows transition from sinus rhythm with premature atrial contractions to atrial fibrillation with rapid response. Informed consent  obtained for cardioversion as part of the transesophageal echocardiogram to assess severe mitral regurgitation and left atrial appendage. - Stop beta blocker - Administer amiodarone IV  Discussed the risks and benefits of DCCV.  Consented for DCCV at time of TEE  - Perform transesophageal echocardiogram to assess severe mitral regurgitation and left atrial appendage  Severe Mitral Regurgitation Severe mitral regurgitation likely secondary to atrial functional causes. Not a candidate for surgical evaluation due to multiple comorbidities, significant COPD, frailty, and low BMI. Potential candidate for outpatient mitral valve repair (MPR). - Perform transesophageal echocardiogram to assess mitral regurgitation  Acute Heart Failure with Recovered Ejection Fraction Acute heart failure with recovered ejection fraction.  Presence of moderate pericardial effusion without evidence of tamponade  Decision to hold Lasix due to current clinical status and pending echocardiogram results. - Hold Lasix - Administer potassium for hypokalemia - Consider restarting diuretics based on clinical status and echocardiogram results  Coronary Artery Disease Non-obstructive coronary artery disease with incidental coronary artery calcifications. On statin therapy for hyperlipidemia.  Chronic Obstructive  Pulmonary Disease (COPD) Significant COPD contributing to overall frailty and complicating cardiac management.  Hypokalemia Hypokalemia identified and being treated with potassium supplementation. - Administered potassium  Riley Lam, MD FASE [facility] Cardiologist [facility]  [address] [place], [address] [place], [place] [address] [phone]  11:55 AM

## 2023-11-16 NOTE — Progress Notes (Addendum)
 Patient Name: [name] Date of Encounter: 11/16/2023 [facility] Cardiologist: Rollene Rotunda, MD   Interval Summary  .    Went back into atrial fibrillation with RVR at about 7 pm. She feels like her heart is racing and continues to have shortness of breath. Denies having chest pain.  Vital Signs .    Vitals:   11/16/23 0440 11/16/23 0510 11/16/23 0540 11/16/23 0610  BP:  (!) 110/58 98/74 115/79  Pulse:      Resp: 16 (!) 21 19   Temp:      TempSrc:      SpO2:      Weight:      Height:        Intake/Output Summary (Last 24 hours) at 11/16/2023 0714 Last data filed at 11/15/2023 2300 Gross per 24 hour  Intake 175.34 ml  Output 1250 ml  Net -1074.66 ml      11/16/2023    4:09 AM 11/14/2023   12:38 PM 10/12/2023    7:16 AM  Last 3 Weights  Weight (lbs) 77 lb 9.6 oz 92 lb 92 lb  Weight (kg) 35.2 kg 41.731 kg 41.731 kg      Telemetry/ECG     Went into atrial fibrillation with RVR at about 7pm. Rate slowly went down over night but increased again at around 6am rates are currently in the 140's- Personally Reviewed  Physical Exam .   GEN: No acute distress.   Neck: 3+ JVD Cardiac:  Irregularly irregular rhythm, tachycardic at a rate of about 140, systolic ejection murmur Respiratory: Bibasilar crackles and wheezing heard through out the lungs Rachael: No edema  Assessment & Plan .     [name] is a 73 y.o. female with a hx of Nonischemic cardiomyopathy, paroxysmal atrial fibrillation, SVT, hyperlipidemia, B12 deficiency, COPD, fibromyalgia, GERD, and breast cancer who is being seen 11/15/2023 for the evaluation of atrial fibrillation at the request of sreeram Narendranath MD.   Acute heart failure with improved ejection fraction Severe mitral regurgitation (previously mild on echo in 11/2022) Moderate pericardial effusion An echo was done on 11/15/2023 and showed an LVEF of 60 to 65%, no regional wall motion abnormalities, normal RV systolic  function, severely dilated left atrium, severely dilated right atrium, moderate pericardial effusion with no evidence of tamponade, severe mitral regurgitation, dilated ascending aorta measuring 41 mm, right atrial pressure was estimated at 15 mmHg. -Was previously started on IV Lasix 40 mg twice daily.  On 11/15/2023 creatinine 0.70, potassium decreased at 2.9, sodium 137, Is net out 1.075L. Continues to have crackles - Stop  IV lasix 40 mg twice daily - The hospitalist ordered potassium. - Will recheck potassium at noon today. - TEE is scheduled for today to assess MR. - May consider cardioversion after TEE if she is still in atrial flutter.     Paroxysmal atrial flutter CHA2DS2-VASc 6.  CHF 1, vascular disease 1, female 1, age 57, Prior stroke 2. -Was initially found during COPD exacerbation on 11/2022. - Was previously started on IV Cardizem 10 mg/h and Lopressor 25 mg daily. Was previously rate controlled the rate at 1230 was 73 and blood pressure was 104/6. Went back into atrial flutter with RVR at about 7 pm on 11/15/23. After several hours appeared to be better rate controled but about 6 am to current time was in atrial flutter with RVR with rates in 130's and 140's. - TSH normal at 2.991 - Continue Eliquis 5 mg twice daily. -  Continue Oral Cardizem 120 mg daily.  - Stop metoprolol tartrate to 25 mg twice daily. Due to low blood pressures and possible COPD exacerbation. - Started IV amiodarone with bolus.    Coronary artery disease - CAD was seen on pulmonary CT on 11/14/2023 - Start atorvastatin 40 mg daily   Hyperlipidemia - LDL on 08/30/2023 was 160 - LDL on 11/16/23 was 131. Goal is less than 70. - Start atorvastatin 40 mg daily.   Hypokalemia - The hospitalist ordered potassium replenishment. -- Stop  IV lasix 40 mg twice daily. Will reassess following TEE. - The hospitalist ordered potassium. - Will recheck potassium at noon today   Normocytic anemia -Hemoglobin on  11/15/2023 was 9.0. Today it improved to 10.1.  Baseline hemoglobin appears to be between 12 and 14. - Consider Iron labs and FOBT - Management per primary    COPD - likely contributing to shortness of breath. - Management per primary  Management per primary GERD Fibromyalgia  For questions or updates, please contact [facility] Please consult [email or web address] for contact info under        Signed, Arabella Merles, PA-C    I have personally seen and examined the patient.  My HPI, Exam, and assessment and plan are below, independent of the NPP above.  [name] is a 73 year old female with atrial fibrillation and COPD who presents with severe mitral regurgitation.   She has severe mitral regurgitation, likely secondary to atrial functional causes. Her frailty and multiple comorbidities make her a poor candidate for surgical evaluation.  She experienced atrial fibrillation with a rapid ventricular response overnight, with heart rates ranging from 100 to 130 beats per minute. Despite medication management with diltiazem and metoprolol, her treatment was limited due to hypotension, with blood pressure recorded at 98/74 mmHg. She has worsening heart racing and shortness of breath. No lower extremity edema is noted.  Exam notable for  VITALS: BP- 98/74, HR 130 MEASUREMENTS: BMI- 15.0.  She is thin and frail GENERAL: Mild distress, no tachypnea. NECK: Jugular venous distension. CHEST: Wheezing in both lung fields. CARDIOVASCULAR: Irregular tachycardia, systolic murmur. EXTREMITIES: No lower extremity edema.  Atrial Fibrillation with Rapid Ventricular Response Paroxysmal atrial flutter or atrial fibrillation with rapid ventricular response. Limited use of diltiazem and metoprolol due to hypotension. Symptoms include palpitations and dyspnea. Telemetry shows transition from sinus rhythm with premature atrial contractions to atrial fibrillation with rapid response. Informed consent  obtained for cardioversion as part of the transesophageal echocardiogram to assess severe mitral regurgitation and left atrial appendage. - Stop beta blocker - Administer amiodarone IV  Discussed the risks and benefits of DCCV.  Consented for DCCV at time of TEE  - Perform transesophageal echocardiogram to assess severe mitral regurgitation and left atrial appendage  Severe Mitral Regurgitation Severe mitral regurgitation likely secondary to atrial functional causes. Not a candidate for surgical evaluation due to multiple comorbidities, significant COPD, frailty, and low BMI. Potential candidate for outpatient mitral valve repair (MPR). - Perform transesophageal echocardiogram to assess mitral regurgitation  Acute Heart Failure with Recovered Ejection Fraction Acute heart failure with recovered ejection fraction.  Presence of moderate pericardial effusion without evidence of tamponade  Decision to hold Lasix due to current clinical status and pending echocardiogram results. - Hold Lasix - Administer potassium for hypokalemia - Consider restarting diuretics based on clinical status and echocardiogram results  Coronary Artery Disease Non-obstructive coronary artery disease with incidental coronary artery calcifications. On statin therapy for hyperlipidemia.  Chronic Obstructive  Pulmonary Disease (COPD) Significant COPD contributing to overall frailty and complicating cardiac management.  Hypokalemia Hypokalemia identified and being treated with potassium supplementation. - Administered potassium  Riley Lam, MD FASE [facility] Cardiologist [facility]  [address] [place], [address] [place], [place] [address] [phone]  11:55 AM

## 2023-11-16 NOTE — Telephone Encounter (Signed)
 Patient Product/process development scientist completed.    The patient is insured through [facility]. Patient has Medicare and is not eligible for a copay card, but may be able to apply for patient assistance or Medicare RX Payment Plan (Patient Must reach out to their plan, if eligible for payment plan), if available.    Ran test claim for Eliquis 5 mg and the current 30 day co-pay is $47.00.  Ran test claim for Xarelto 20 mg and the current 30 day co-pay is $47.00.  Ran test claim for Farxiga 10 mg and the current 30 day co-pay is $47.00.  Ran test claim for Jardiance 10 mg and the current 30 day co-pay is $47.00.   This test claim was processed through [facility]- copay amounts may vary at other pharmacies due to pharmacy/plan contracts, or as the patient moves through the different stages of their insurance plan.     Roland Earl, CPHT Pharmacy Technician III Certified Patient Advocate [facility] Patient Advocate Team Direct Number: [phone]  Fax: [phone]

## 2023-11-16 NOTE — CV Procedure (Signed)
   TRANSESOPHAGEAL ECHOCARDIOGRAM GUIDED DIRECT CURRENT CARDIOVERSION  NAME:  [name]    MRN: [id number] DOB:  [birth year]    ADMIT DATE: 11/14/2023  INDICATIONS: Symptomatic atrial fibrillation   PROCEDURE:   Informed consent was obtained prior to the procedure. The risks, benefits and alternatives for the procedure were discussed and the patient comprehended these risks.  Risks include, but are not limited to, cough, sore throat, vomiting, nausea, somnolence, esophageal and stomach trauma or perforation, bleeding, low blood pressure, aspiration, pneumonia, infection, trauma to the teeth and death.    After a procedural time-out, the oropharynx was anesthetized and the patient was sedated by the anesthesia service. The transesophageal probe was inserted in the esophagus and stomach without difficulty and multiple views were obtained. Anesthesia was monitored by the anesthesiology team.  COMPLICATIONS:    Complications: No complications Patient tolerated procedure well.  KEY FINDINGS:  Normal left ventricular systolic function, no LAA clot. Full Report to follow.   CARDIOVERSION:     Indications:  Symptomatic Atrial Fibrillation  Procedure Details:  Once the TEE was complete, the patient had the defibrillator pads placed in the anterior and posterior position. Once an appropriate level of sedation was confirmed, the patient was cardioverted x 1  with 200J of biphasic synchronized energy.  The patient converted to NSR.  There were no apparent complications.  The patient had normal neuro status and respiratory status post procedure with vitals stable as recorded elsewhere.  Adequate airway was maintained throughout and vital signs monitored per protocol.  The was advised to continue anticoagulation for minimum 4 week with interruption.   All questions has been answered at this time.   Thomasene Ripple, DO [facility] [place]  [facility]  3:26 PM

## 2023-11-16 NOTE — Anesthesia Preprocedure Evaluation (Addendum)
 Anesthesia Evaluation  Patient identified by MRN, date of birth, ID band Patient awake    Reviewed: Allergy & Precautions, NPO status , Patient's Chart, lab work & pertinent test results  History of Anesthesia Complications Negative for: history of anesthetic complications  Airway Mallampati: III  TM Distance: >3 FB Neck ROM: Full    Dental  (+) Lower Dentures, Dental Advisory Given   Pulmonary shortness of breath, COPD,  COPD inhaler and oxygen dependent, former smoker    + decreased breath sounds      Cardiovascular + dysrhythmias Atrial Fibrillation + Valvular Problems/Murmurs MR  Rhythm:Irregular Rate:Tachycardia  1. Left ventricular ejection fraction, by estimation, is 60 to 65%. The  left ventricle has normal function. The left ventricle has no regional  wall motion abnormalities. Left ventricular diastolic function could not  be evaluated.   2. Right ventricular systolic function is normal. The right ventricular  size is normal. There is normal pulmonary artery systolic pressure. The  estimated right ventricular systolic pressure is 35.8 mmHg.   3. Left atrial size was severely dilated.   4. Right atrial size was severely dilated.   5. Moderate pericardial effusion. The pericardial effusion is posterior  to the left ventricle. There is no evidence of cardiac tamponade.   6. The mitral valve is abnormal. Severe mitral valve regurgitation. No  evidence of mitral stenosis.   7. The aortic valve is tricuspid. There is mild calcification of the  aortic valve. Aortic valve regurgitation is not visualized. Aortic valve  sclerosis/calcification is present, without any evidence of aortic  stenosis.   8. Aortic dilatation noted. There is mild dilatation of the ascending  aorta, measuring 41 mm.   9. The inferior vena cava is dilated in size with <50% respiratory  variability, suggesting right atrial pressure of 15 mmHg.      Neuro/Psych    GI/Hepatic ,GERD  ,,  Endo/Other    Renal/GU Lab Results      Component                Value               Date                      NA                       137                 11/16/2023                K                        2.8 (L)             11/16/2023                CO2                      34 (H)              11/16/2023                GLUCOSE                  91                  11/16/2023  BUN                      9                   11/16/2023                CREATININE               0.70                11/16/2023                CALCIUM                  8.6 (L)             11/16/2023                GFR                      66.45               04/01/2017                EGFR                     80                  08/30/2023                GFRNONAA                 >60                 11/16/2023                Musculoskeletal  (+) Arthritis ,  Fibromyalgia -  Abdominal   Peds  Hematology  (+) Blood dyscrasia, anemia Lab Results      Component                Value               Date                      WBC                      7.9                 11/16/2023                HGB                      10.1 (L)            11/16/2023                HCT                      31.1 (L)            11/16/2023                MCV                      88.1                11/16/2023  PLT                      708 (H)             11/16/2023              Anesthesia Other Findings   Reproductive/Obstetrics                              Anesthesia Physical Anesthesia Plan  ASA: 3  Anesthesia Plan: MAC and General   Post-op Pain Management:    Induction: Intravenous  PONV Risk Score and Plan: 3 and Propofol infusion and Treatment may vary due to age or medical condition  Airway Management Planned: Nasal Cannula, Natural Airway and Simple Face Mask  Additional Equipment: None  Intra-op Plan:    Post-operative Plan:   Informed Consent: I have reviewed the patients History and Physical, chart, labs and discussed the procedure including the risks, benefits and alternatives for the proposed anesthesia with the patient or authorized representative who has indicated his/her understanding and acceptance.     Dental advisory given  Plan Discussed with: CRNA  Anesthesia Plan Comments:          Anesthesia Quick Evaluation

## 2023-11-17 ENCOUNTER — Inpatient Hospital Stay (HOSPITAL_COMMUNITY)

## 2023-11-17 ENCOUNTER — Telehealth: Payer: Self-pay

## 2023-11-17 ENCOUNTER — Encounter (HOSPITAL_COMMUNITY): Payer: Self-pay | Admitting: Internal Medicine

## 2023-11-17 DIAGNOSIS — I340 Nonrheumatic mitral (valve) insufficiency: Secondary | ICD-10-CM | POA: Diagnosis not present

## 2023-11-17 DIAGNOSIS — E876 Hypokalemia: Secondary | ICD-10-CM | POA: Diagnosis not present

## 2023-11-17 DIAGNOSIS — I509 Heart failure, unspecified: Secondary | ICD-10-CM | POA: Diagnosis not present

## 2023-11-17 DIAGNOSIS — I4719 Other supraventricular tachycardia: Secondary | ICD-10-CM | POA: Diagnosis not present

## 2023-11-17 DIAGNOSIS — I4892 Unspecified atrial flutter: Secondary | ICD-10-CM | POA: Diagnosis not present

## 2023-11-17 LAB — URINALYSIS, ROUTINE W REFLEX MICROSCOPIC
Bilirubin Urine: NEGATIVE
Glucose, UA: NEGATIVE mg/dL
Hgb urine dipstick: NEGATIVE
Ketones, ur: NEGATIVE mg/dL
Leukocytes,Ua: NEGATIVE
Nitrite: NEGATIVE
Protein, ur: NEGATIVE mg/dL
Specific Gravity, Urine: 1.004 — ABNORMAL LOW (ref 1.005–1.030)
pH: 6 (ref 5.0–8.0)

## 2023-11-17 LAB — BASIC METABOLIC PANEL
Anion gap: 13 (ref 5–15)
Anion gap: 9 (ref 5–15)
BUN: 8 mg/dL (ref 8–23)
BUN: <5 mg/dL — ABNORMAL LOW (ref 8–23)
CO2: 30 mmol/L (ref 22–32)
CO2: 33 mmol/L — ABNORMAL HIGH (ref 22–32)
Calcium: 8.4 mg/dL — ABNORMAL LOW (ref 8.9–10.3)
Calcium: 8.5 mg/dL — ABNORMAL LOW (ref 8.9–10.3)
Chloride: 96 mmol/L — ABNORMAL LOW (ref 98–111)
Chloride: 97 mmol/L — ABNORMAL LOW (ref 98–111)
Creatinine, Ser: 0.71 mg/dL (ref 0.44–1.00)
Creatinine, Ser: 0.68 mg/dL (ref 0.44–1.00)
GFR, Estimated: >60 mL/min (ref >60)
GFR, Estimated: >60 mL/min (ref >60)
Glucose, Bld: 140 mg/dL — ABNORMAL HIGH (ref 70–99)
Glucose, Bld: 133 mg/dL — ABNORMAL HIGH (ref 70–99)
Potassium: 3.4 mmol/L — ABNORMAL LOW (ref 3.5–5.1)
Potassium: 4 mmol/L (ref 3.5–5.1)
Sodium: 139 mmol/L (ref 135–145)
Sodium: 139 mmol/L (ref 135–145)

## 2023-11-17 LAB — LIPOPROTEIN A (LPA): Lipoprotein (a): 9 nmol/L (ref <75.0)

## 2023-11-17 LAB — MAGNESIUM: Magnesium: 2.3 mg/dL (ref 1.7–2.4)

## 2023-11-17 MED ORDER — FUROSEMIDE 10 MG/ML IJ SOLN
40.0000 mg | Freq: Two times a day (BID) | INTRAMUSCULAR | Status: DC
  Administered 2023-11-17 – 2023-11-18 (×2): 40 mg via INTRAVENOUS
  Filled 2023-11-17 (×2): qty 4

## 2023-11-17 NOTE — Progress Notes (Addendum)
 PROGRESS NOTE [name]  QMV:[id number] DOB: [birth year] DOA: 11/14/2023 PCP: Melida Quitter, PA  Brief Narrative/Hospital Course:  73 year old female with medical history significant for nonischemic cardiomyopathy, mitral regurgitation, SVT, GERD, hyperlipidemia, IBS, recent bimalleolar ankle fracture after fall presented from SNF for evaluation of increased heart rate, shortness of breath for 2 days.  Patient is noted to have atrial tachycardia, started on Cardizem drip admitted to [facility] service for further management.  Cardiology consulted, started amiodarone drip, possible TEE/ cardioversion AND being treated for a flutter/A-fib with RVR MAT acute on chronic HFrEF, electrolyte imbalance.   Consultation: Cardiology     Subjective: Patient seen and examined this morning Alert and oriented, on 3 L oxygen which is her home setting oxygen for COPD, no leg edema Seen and examined aaox3, on 3l [place] home setting Overnight afebrile BP stable.  Patient had ventricular bigeminy, labs showed low electrolytes and replacement started cardiology notified.   Assessment and Plan: Principal Problem:   Atrial tachycardia (HCC) Active Problems:   Hypokalemia   Mixed hyperlipidemia   COPD mixed type (HCC)   Protein-calorie malnutrition, severe (HCC)   Chronic respiratory failure with hypoxia (HCC)   Closed bimalleolar fracture of right ankle   Atrial Flutter. Afib with RVR Multifocal Atrial tachycardia  Hx of SVT, Zio in fall 2024 noted svt  no afib.  Workup with no PE on CTA Cardiology following closely underwent TEE 3/18.  Now on Cardizem off metoprolol due to soft blood pressure/hypertension. Cont amiodarone drip, Eliquis 5 mg twice daily.   Acute on Chronic HFrEF with recovered EF Severe MR H/o non ischemic cardiomyopathy Left-sided pleural effusion: NICM after treatment of breast cancer, chronic HFrEF with recovery with last EF of 60-65%. Cardiology following closely-she may be a  candidate for [facility] structural heart team to evaluate.  Continue to optimize heart rate, blood pressure and fluid status.  Continue IV Lasix,Cardizem.  Reassess pleural effusion with chest x-ray, BNP 3/18-TEE > EF 55-60%, RV systolic function size is normal severe MR mild TR borderline dilatation of ascending aorta.     Hypokalemia: Hypomagnesemia: In the setting of diuresis please continue to replace and recheck   COPD on chronic oxygen Chronic respiratory failure with hypoxia on 3 L [place]: Continue home 02 and PRN nebs.   Abdominal discomfort, nausea: Pyelonephritis on CT abdomen Urinalysis ordered-not collected yet.Started ciprofloxacin 400 mg bid. Due to norovirus outbreak in the facility, Norovirus panel ordered. Continue PPI, antiemetics.   Hyperlipidemia Cont statins.   Osteoporosis: S/p recent repair for bimalleolar ankle fracture after a fall cont ptot fall precaution, aspiration precaution   Severe malnutrition BMI 15.16 Will get dietitian evaluation. Encourage oral diet, supplements.  DVT prophylaxis: Eliquis Code Status:   Code Status: Full Code Family Communication: plan of care discussed with patient at bedside. Patient status is: Remains hospitalized because of severity of illness Level of care: Progressive   Dispo: The patient is from: home            Anticipated disposition: Planning for skilled nursing facility once cleared by cardiology  Objective: Vitals last 24 hrs: Vitals:   11/16/23 2315 11/17/23 0505 11/17/23 0700 11/17/23 0726  BP: 125/62 107/64  103/82  Pulse: 79 79 72 80  Resp: (!) 22 19 20 17   Temp:    98 F (36.7 C)  TempSrc:    Oral  SpO2: 100% 100% 100% 100%  Weight:  39.6 kg    Height:       Weight change:  4.399 kg  Physical Examination: General exam: alert awake,at baseline, older than stated age HEENT:Oral mucosa moist, Ear/Nose WNL grossly Respiratory system: Bilaterally clear BS,no use of accessory muscle Cardiovascular  system: S1 & S2 +, No JVD. Gastrointestinal system: Abdomen soft,NT,ND, BS+ Nervous System: Alert, awake, moving all extremities,and following commands. Extremities: LE edema trace neg,distal peripheral pulses palpable and warm. Skin: No rashes,no icterus. MSK: Normal muscle bulk,tone, power   Medications reviewed:  Scheduled Meds:  apixaban  5 mg Oral BID   atorvastatin  40 mg Oral Daily   citalopram  20 mg Oral q AM   diltiazem  120 mg Oral Daily   furosemide  40 mg Intravenous BID   pantoprazole  40 mg Oral Daily   umeclidinium bromide  1 puff Inhalation Daily   Continuous Infusions:  amiodarone 30 mg/hr (11/17/23 0955)   ciprofloxacin Stopped (11/17/23 0418)    Diet Order             Diet Heart Room service appropriate? Yes; Fluid consistency: Thin  Diet effective now                  Intake/Output Summary (Last 24 hours) at 11/17/2023 1348 Last data filed at 11/17/2023 1300 Gross per 24 hour  Intake 2821.54 ml  Output 750 ml  Net 2071.54 ml   Net IO Since Admission: 1,022.61 mL [11/17/23 1348]  Wt Readings from Last 3 Encounters:  11/17/23 39.6 kg  10/12/23 41.7 kg  10/04/23 41.7 kg     Unresulted Labs (From admission, onward)     Start     Ordered   11/18/23 0500  Brain natriuretic peptide  Tomorrow morning,   R        11/17/23 1108   11/16/23 0500  Basic metabolic panel  Daily,   R      11/15/23 1852   11/16/23 0500  Magnesium  Daily,   R      11/15/23 1852   11/15/23 1241  Norovirus group 1 & 2 by PCR, stool  (Norovirus group 1 & 2 by PCR, stool panel)  Once,   R        11/15/23 1240          Data Reviewed: I have personally reviewed following labs and imaging studies CBC: Recent Labs  Lab 11/14/23 1250 11/15/23 0120 11/15/23 0400 11/16/23 0631  WBC 9.7 10.2 9.0 7.9  HGB 9.8* 9.3* 9.0* 10.1*  HCT 30.5* 29.6* 28.5* 31.1*  MCV 91.6 92.2 91.1 88.1  PLT 557* 475* 575* 708*   Basic Metabolic Panel:  Recent Labs  Lab 11/14/23 1250  11/15/23 0120 11/15/23 0400 11/16/23 0631 11/16/23 1133 11/17/23 0408 11/17/23 0857  NA 138  --  138 137  --  139 139  K 4.0  --  4.4 2.9* 2.8* 3.4* 4.0  CL 96*  --  95* 89*  --  96* 97*  CO2 30  --  28 34*  --  30 33*  GLUCOSE 122*  --  120* 91  --  140* 133*  BUN 7*  --  8 9  --  8 <5*  CREATININE 0.62 0.63 0.72 0.70  --  0.71 0.68  CALCIUM 8.7*  --  8.5* 8.6*  --  8.4* 8.5*  MG 1.9  --   --  1.6*  --  2.3  --    GFR: Estimated Creatinine Clearance: 39.2 mL/min (by C-G formula based on SCr of 0.68 mg/dL). Liver Function Tests:  Recent Labs  Lab 11/15/23 0400  AST 65*  ALT 17  ALKPHOS 61  BILITOT 1.9*  PROT 6.7  ALBUMIN 3.1*   Recent Labs    11/15/23 0120  HGBA1C 5.1  No results for input(s): "GLUCAP" in the last 168 hours. Recent Labs    11/16/23 0631  CHOL 202*  HDL 52  LDLCALC 131*  TRIG 94  CHOLHDL 3.9  Sepsis Labs: No results for input(s): "PROCALCITON", "LATICACIDVEN" in the last 168 hours. Recent Results (from the past 240 hours)  Blood culture (routine x 2)     Status: None (Preliminary result)   Collection Time: 11/14/23  1:23 PM   Specimen: BLOOD RIGHT ARM  Result Value Ref Range Status   Specimen Description BLOOD RIGHT ARM  Final   Special Requests   Final    BOTTLES DRAWN AEROBIC AND ANAEROBIC Blood Culture adequate volume   Culture   Final    NO GROWTH 3 DAYS Performed at [facility], [address] [place], [place] [address]    Report Status PENDING  Incomplete  Blood culture (routine x 2)     Status: None (Preliminary result)   Collection Time: 11/14/23  1:28 PM   Specimen: BLOOD  Result Value Ref Range Status   Specimen Description BLOOD RIGHT ANTECUBITAL  Final   Special Requests   Final    BOTTLES DRAWN AEROBIC AND ANAEROBIC Blood Culture adequate volume   Culture   Final    NO GROWTH 3 DAYS Performed at [facility], [address] [place], [place] [address]    Report Status PENDING  Incomplete     Antimicrobials/Microbiology: Anti-infectives (From admission, onward)    Start     Dose/Rate Route Frequency Ordered Stop   11/16/23 1600  ciprofloxacin (CIPRO) IVPB 400 mg        400 mg 200 mL/hr over 60 Minutes Intravenous Every 12 hours 11/16/23 1558     11/14/23 1330  cefTRIAXone (ROCEPHIN) 2 g in sodium chloride 0.9 % 100 mL IVPB        2 g 200 mL/hr over 30 Minutes Intravenous  Once 11/14/23 1322 11/14/23 1525   11/14/23 1330  azithromycin (ZITHROMAX) 500 mg in sodium chloride 0.9 % 250 mL IVPB        500 mg 250 mL/hr over 60 Minutes Intravenous  Once 11/14/23 1322 11/14/23 1634         Component Value Date/Time   SDES BLOOD RIGHT ANTECUBITAL 11/14/2023 1328   SPECREQUEST  11/14/2023 1328    BOTTLES DRAWN AEROBIC AND ANAEROBIC Blood Culture adequate volume   CULT  11/14/2023 1328    NO GROWTH 3 DAYS Performed at [facility], [address] [place], [place] [address]    REPTSTATUS PENDING 11/14/2023 1328     Radiology Studies: ECHO TEE Result Date: 11/16/2023    TRANSESOPHOGEAL ECHO REPORT   Patient Name:   ATHINA FAHEY Date of Exam: 11/16/2023 Medical Rec #:  [id number]         Height:       60.0 in Accession #:    [id number]        Weight:       77.6 lb Date of Birth:  16-Mar-1951        BSA:          1.248 m Patient Age:    72 years          BP:  111/66 mmHg Patient Gender: F                 HR:           99 bpm. Exam Location:  Inpatient Procedure: Color Doppler, Cardiac Doppler and Transesophageal Echo (Both            Spectral and Color Flow Doppler were utilized during procedure). Indications:     Cardioversion  History:         Patient has prior history of Echocardiogram examinations, most                  recent 11/15/2023.  Sonographer:     Harriette Bouillon RDCS Referring Phys:  [id number] ZANE ADAMS Diagnosing Phys: Thomasene Ripple DO PROCEDURE: The transesophogeal probe was passed without difficulty through the esophogus of the patient. Sedation performed by  different physician. The patient developed no complications during the procedure.  IMPRESSIONS  1. Left ventricular ejection fraction, by estimation, is 55 to 60%. The left ventricle has normal function.  2. Right ventricular systolic function is normal. The right ventricular size is normal.  3. Left atrial size was mildly dilated. No left atrial/left atrial appendage thrombus was detected.  4. The mitral valve is abnormal. Severe mitral valve regurgitation. No evidence of mitral stenosis.  5. The tricuspid valve is abnormal. Tricuspid valve regurgitation is mild to moderate.  6. The aortic valve is tricuspid. Aortic valve regurgitation is trivial. No aortic stenosis is present.  7. There is borderline dilatation of the ascending aorta, measuring 36 mm. FINDINGS  Left Ventricle: Left ventricular ejection fraction, by estimation, is 55 to 60%. The left ventricle has normal function. The left ventricular internal cavity size was small. Right Ventricle: The right ventricular size is normal. No increase in right ventricular wall thickness. Right ventricular systolic function is normal. Left Atrium: Left atrial size was mildly dilated. No left atrial/left atrial appendage thrombus was detected. Right Atrium: Right atrial size was not well visualized. Pericardium: Trivial pericardial effusion is present. The pericardial effusion is circumferential. Mitral Valve: The mitral valve is abnormal. Severe mitral valve regurgitation. No evidence of mitral valve stenosis. Tricuspid Valve: The tricuspid valve is abnormal. Tricuspid valve regurgitation is mild to moderate. No evidence of tricuspid stenosis. Aortic Valve: The aortic valve is tricuspid. Aortic valve regurgitation is trivial. No aortic stenosis is present. Pulmonic Valve: The pulmonic valve was not well visualized. Pulmonic valve regurgitation is trivial. No evidence of pulmonic stenosis. Aorta: There is borderline dilatation of the ascending aorta, measuring 36 mm.  There is minimal (Grade I) layered plaque involving the aortic root. Pulmonary Artery: The pulmonary artery is of normal size. IAS/Shunts: No atrial level shunt detected by color flow Doppler.   AORTA Ao Asc diam: 3.60 cm Lavona Mound Tobb DO Electronically signed by Thomasene Ripple DO Signature Date/Time: 11/16/2023/6:05:04 PM    Final    EP STUDY Result Date: 11/16/2023 See surgical note for result.   LOS: 3 days   Total time spent in review of labs and imaging, patient evaluation, formulation of plan, documentation and communication with family: 35 minutes  Lanae Boast, MD  [facility]  11/17/2023, 1:48 PM

## 2023-11-17 NOTE — TOC Progression Note (Addendum)
 Transition of Care [facility]) - Progression Note    Patient Details  Name: MACON LESESNE MRN: [id number] Date of Birth: 09-07-1950  [facility] CM/SW Contact  Michaela Corner, [place] Phone Number: 11/17/2023, 11:35 AM  Clinical Narrative:   CSW met patient at bedside and introduced self/role. CSW discussed PT recs for SNF at this time. Patient is agreeable but stated she does not want to go back to [facility]. Per chart review, patient is in copay days at [facility]. CSW explained copay days to patient and will obtain prices from accepting facilities for patient. SNF workup complete at this time.   4:13 PM CSW provided medicare.gov rated accepting bed offers to patient. She asked CSW to follow up with her daughter regarding decision of SNF. CSW called [name], and she asked if [facility] can accept patient. CSW informed [name] that [facility] declined. She stated CSW can email her the list of accepting facilities and follow up at a later time.   TOC will continue to follow.    Expected Discharge Plan: Skilled Nursing Facility Barriers to Discharge: Continued Medical Work up  Expected Discharge Plan and Services   Discharge Planning Services: CM Consult   Living arrangements for the past 2 months: Single Family Home                           HH Arranged: RN, Disease Management, PT, OT, Nurse's Aide HH Agency: [facility] Date [facility] Agency Contacted: 11/15/23 Time HH Agency Contacted: [phone] Representative spoke with at [facility] Agency: Clifton Custard   Social Determinants of Health (SDOH) Interventions SDOH Screenings   Food Insecurity: Food Insecurity Present (11/14/2023)  Housing: Low Risk  (11/17/2023)  Recent Concern: Housing - High Risk (11/14/2023)  Transportation Needs: No Transportation Needs (11/17/2023)  Utilities: Not At Risk (11/14/2023)  Alcohol Screen: Low Risk  (11/17/2023)  Depression (PHQ2-9): Medium Risk (09/16/2023)  Financial Resource Strain: Low Risk  (11/17/2023)   Physical Activity: Inactive (05/11/2023)  Social Connections: Socially Isolated (11/14/2023)  Stress: No Stress Concern Present (05/11/2023)  Tobacco Use: Medium Risk (11/14/2023)  Health Literacy: Adequate Health Literacy (05/11/2023)    Readmission Risk Interventions     No data to display

## 2023-11-17 NOTE — [place] FL2 (Signed)
 [place] MEDICAID FL2 LEVEL OF CARE FORM     IDENTIFICATION  Patient Name: [name] Birthdate: 06-Oct-1950 Sex: female Admission Date (Current Location): 11/14/2023  [facility] and [place] Number:  Producer, television/film/video and Address:  [facility] [place], [address] [place], [place] [address]      Provider Number: [id number]  Attending Physician Name and Address:  Lanae Boast, MD  Relative Name and Phone Number:       Current Level of Care: Hospital Recommended Level of Care: Skilled Nursing Facility Prior Approval Number:    Date Approved/Denied: 11/10/23 PASRR Number: [id number]  Discharge Plan: SNF    Current Diagnoses: Patient Active Problem List   Diagnosis Date Noted   Closed bimalleolar fracture of right ankle 10/12/2023   Closed bimalleolar fracture of right ankle, initial encounter 10/12/2023   Atrial tachycardia (HCC) 06/05/2023   Chronic respiratory failure with hypoxia (HCC) 02/19/2023   Protein-calorie malnutrition, severe (HCC) 12/24/2022   Tachyarrhythmia 12/22/2022   Hypokalemia 12/22/2022   Estrogen deficiency 06/28/2019   Osteopenia after menopause 11/06/2017   MDD (major depressive disorder), recurrent episode, moderate (HCC) 09/22/2017   Suicide attempt (HCC)    B12 deficiency 08/03/2017   Other insomnia 11/23/2016   DDD (degenerative disc disease), cervical 11/23/2016   DDD (degenerative disc disease), lumbar 11/23/2016   Other idiopathic scoliosis, lumbar region 11/23/2016   COPD mixed type (HCC) 11/20/2013   Vitamin D deficiency 07/06/2011   CARDIOMYOPATHY, PRIMARY, DILATED 01/01/2009   Malignant tumor of breast (HCC) 07/31/2008   Mixed hyperlipidemia 10/18/2007   Esophagitis 10/18/2007   GAD (generalized anxiety disorder) 10/12/2007   IBS 10/12/2007   Primary osteoarthritis of both hands 10/12/2007   Osteoporosis 10/12/2007   History of colonic polyps 10/12/2007   MIGRAINES, HX OF 10/12/2007    Fibromyalgia--Dr. Victory Dakin, on Ultram 07/14/2007    Orientation RESPIRATION BLADDER Height & Weight     Self, Time, Situation, Place  O2 (3L O2 [place]) Continent Weight: 87 lb 4.8 oz (39.6 kg) Height:  5' (152.4 cm)  BEHAVIORAL SYMPTOMS/MOOD NEUROLOGICAL BOWEL NUTRITION STATUS      Continent Diet (see dc  summary)  AMBULATORY STATUS COMMUNICATION OF NEEDS Skin   Extensive Assist Verbally Normal                       Personal Care Assistance Level of Assistance  Bathing, Dressing, Feeding Bathing Assistance: Maximum assistance Feeding assistance: Limited assistance Dressing Assistance: Maximum assistance     Functional Limitations Info  Speech, Sight, Hearing Sight Info: Impaired Financial trader) Hearing Info: Adequate Speech Info: Adequate    SPECIAL CARE FACTORS FREQUENCY  PT (By licensed PT), OT (By licensed OT)     PT Frequency: 5x week OT Frequency: 5x week            Contractures Contractures Info: Not present    Additional Factors Info  Code Status, Allergies, Isolation Precautions, Psychotropic Code Status Info: Full Allergies Info: Amoxicillin-pot Clavulanate, Doxycycline, Venlafaxine Psychotropic Info: citalopram (CELEXA)   Isolation Precautions Info: Enteric pre     Current Medications (11/17/2023):  This is the current hospital active medication list Current Facility-Administered Medications  Medication Dose Route Frequency Provider Last Rate Last Admin   acetaminophen (TYLENOL) tablet 650 mg  650 mg Oral Q6H PRN Lurline Del, MD   650 mg at 11/15/23 0109   Or   acetaminophen (TYLENOL) suppository 650 mg  650 mg Rectal Q6H PRN Lurline Del, MD  acetaminophen (TYLENOL) tablet 1,000 mg  1,000 mg Oral Q6H PRN Lurline Del, MD       amiodarone (NEXTERONE PREMIX) 360-4.14 MG/200ML-% (1.8 mg/mL) IV infusion  30 mg/hr Intravenous Continuous Adams, Zane, PA-C 16.67 mL/hr at 11/17/23 0955 30 mg/hr at 11/17/23 0955   apixaban (ELIQUIS)  tablet 5 mg  5 mg Oral BID Adams, Zane, PA-C   5 mg at 11/17/23 0906   atorvastatin (LIPITOR) tablet 40 mg  40 mg Oral Daily Adams, Zane, PA-C   40 mg at 11/17/23 7829   ciprofloxacin (CIPRO) IVPB 400 mg  400 mg Intravenous Q12H Marcelino Duster, MD   Stopped at 11/17/23 0418   citalopram (CELEXA) tablet 20 mg  20 mg Oral q AM Marcelino Duster, MD   20 mg at 11/17/23 0612   diltiazem (CARDIZEM CD) 24 hr capsule 120 mg  120 mg Oral Daily Adams, Zane, PA-C   120 mg at 11/17/23 0906   furosemide (LASIX) injection 40 mg  40 mg Intravenous BID Adams, Zane, PA-C       levalbuterol (XOPENEX) nebulizer solution 0.63 mg  0.63 mg Nebulization Q6H PRN Lurline Del, MD       oxyCODONE (Oxy IR/ROXICODONE) immediate release tablet 5 mg  5 mg Oral Q4H PRN Skip Mayer A, MD   5 mg at 11/15/23 2108   pantoprazole (PROTONIX) EC tablet 40 mg  40 mg Oral Daily Skip Mayer A, MD   40 mg at 11/17/23 0906   promethazine (PHENERGAN) tablet 12.5 mg  12.5 mg Oral Q6H PRN Skip Mayer A, MD   12.5 mg at 11/16/23 1244   umeclidinium bromide (INCRUSE ELLIPTA) 62.5 MCG/ACT 1 puff  1 puff Inhalation Daily Lurline Del, MD   1 puff at 11/17/23 5621     Discharge Medications: Please see discharge summary for a list of discharge medications.  Relevant Imaging Results:  Relevant Lab Results:   Additional Information SSN: [id number]  Michaela Corner, LCSWA

## 2023-11-17 NOTE — Hospital Course (Addendum)
 73 year old female with medical history significant for nonischemic cardiomyopathy, mitral regurgitation, SVT, GERD, hyperlipidemia, IBS, recent bimalleolar ankle fracture after fall presented from SNF for evaluation of increased heart rate, shortness of breath for 2 days.  Patient is noted to have atrial tachycardia, started on Cardizem drip admitted to [facility] service for further management. Cardiology consulted, started on amiodarone drip, diuretics for a flutter/A-fib with RVR MAT, acute on chronic HFrEF w/ pericardial effusion. Subsequently noted to be severe atrial functional MR-cardiology recommended structural team evaluation for mitraclip. At this time patient volume status optimized to transition to oral diuretic 3/21, planning for structural team evaluation for MitraClip 12/03/2023 outpatient visit.  Patient is medically stable to discharge to skilled nursing facility Awaiting on Insurance approval and peer to peer done 11/22/23  Consultation: Cardiology

## 2023-11-17 NOTE — Plan of Care (Signed)
  Problem: Clinical Measurements: Goal: Respiratory complications will improve Outcome: Progressing Goal: Cardiovascular complication will be avoided Outcome: Progressing   Problem: Activity: Goal: Risk for activity intolerance will decrease Outcome: Progressing   Problem: Safety: Goal: Ability to remain free from injury will improve Outcome: Progressing   

## 2023-11-17 NOTE — Telephone Encounter (Signed)
 [name]:   Fossa looks good in the SAXB view for the transseptal approach, did not see a clear bicaval in the study. Enough height over the valve to achieve straddle and manipulate down to the valve plane. This was a limited study. What I can assume from the views provided, is that the patient has secondary MR due to bilateral leaflet tethering. A central jet was seen in the long axis views. I did not see a bicomm, 3D, or transgastric with color to determine the extent of the jet or the exact origin. Will want to perform a bicomm/x-plane sweep day of the procedure to confirm everything and where to place our first clip. Posterior leaflet lengths in the grasping views all measured between 0.7 and 0.8cm, enough for our smaller clip options. Valve area was measured in a SAX transgastric view ~4.4cm2. The gradient was not seen in the upload, assuming the patient's gradient can tolerate at least 1 clip. Depending on the origin and extent of the jet, plan on placing first clip central directly over A2P2 and see how the MR responds. If we believe the patient's valve can tolerate multiple clips on the day of the procedure, it is reasonable to plan on placing the first clip on the medial side of A2P2 with a second clip just lateral. NT/NTW appropriate.

## 2023-11-17 NOTE — Progress Notes (Signed)
 Patients daughter called and was updated by this RN on pt progress.

## 2023-11-17 NOTE — Plan of Care (Signed)
  Problem: Education: Goal: Knowledge of General Education information will improve Description: Including pain rating scale, medication(s)/side effects and non-pharmacologic comfort measures Outcome: Progressing   Problem: Health Behavior/Discharge Planning: Goal: Ability to manage health-related needs will improve Outcome: Progressing   Problem: Clinical Measurements: Goal: Ability to maintain clinical measurements within normal limits will improve Outcome: Progressing Goal: Will remain free from infection Outcome: Progressing Goal: Diagnostic test results will improve Outcome: Progressing Goal: Respiratory complications will improve Outcome: Progressing Goal: Cardiovascular complication will be avoided Outcome: Progressing   Problem: Activity: Goal: Risk for activity intolerance will decrease Outcome: Progressing   Problem: Nutrition: Goal: Adequate nutrition will be maintained Outcome: Progressing   Problem: Coping: Goal: Level of anxiety will decrease Outcome: Progressing   Problem: Elimination: Goal: Will not experience complications related to bowel motility Outcome: Progressing Goal: Will not experience complications related to urinary retention Outcome: Progressing   Problem: Pain Managment: Goal: General experience of comfort will improve and/or be controlled Outcome: Progressing   Problem: Safety: Goal: Ability to remain free from injury will improve Outcome: Progressing   Problem: Skin Integrity: Goal: Risk for impaired skin integrity will decrease Outcome: Progressing   Problem: Education: Goal: Ability to demonstrate management of disease process will improve Outcome: Progressing Goal: Ability to verbalize understanding of medication therapies will improve Outcome: Progressing Goal: Individualized Educational Video(s) Outcome: Progressing   Problem: Activity: Goal: Capacity to carry out activities will improve Outcome: Progressing    Problem: Cardiac: Goal: Ability to achieve and maintain adequate cardiopulmonary perfusion will improve Outcome: Progressing   Problem: Education: Goal: Knowledge of disease or condition will improve Outcome: Progressing Goal: Understanding of medication regimen will improve Outcome: Progressing Goal: Individualized Educational Video(s) Outcome: Progressing   Problem: Activity: Goal: Ability to tolerate increased activity will improve Outcome: Progressing   Problem: Cardiac: Goal: Ability to achieve and maintain adequate cardiopulmonary perfusion will improve Outcome: Progressing   Problem: Health Behavior/Discharge Planning: Goal: Ability to safely manage health-related needs after discharge will improve Outcome: Progressing

## 2023-11-17 NOTE — Progress Notes (Signed)
 11/16/2023 at 2203: CCMD reported patient had a "run of bigeminy".   11/16/2023 at 2210: Patient resting in bed with eyes closed. Respirations even and unlabored. Patient denies chest pain/pressure/discomfort, palpitations or feeling like her heart is racing, and denies feeling short of breath. K+ of 2.8 noted. Patient refusing 2200 dose of KCl 40 mEq. Patient reports feeling like it gets stuck and then having a sore throat. Offered to split the pill in half. Patient declined. Discussed letting the tablet dissolve in apple sauce, pudding or ice cream. Patient declined. Asked patient if she would agreeable to me starting another PIV to receive K+ that way. Patient agreeable.   11/16/2023 at 2234: Contacted Dr. Loney Loh to discuss patient's hypokalemia, "run of bigeminy", and patient being reluctant to take PO KCl 40 mEq after discussing various ways to take it.   11/16/2023 at 2250: Magnesium sulfate 1g IV and KCl 10 mEq IV x 6 doses ordered. Dr. Loney Loh requested that I also notify cardiology since they were managing.   11/16/2023 at 2324: Dr. Cherly Beach notified of events discussed above and orders placed by Dr. Loney Loh. Discussed total volume of IVF patient would be receiving and her LVEF. Goal is to have K+ > 4 and Mg > 2. Informed Dr. Cherly Beach that I would get started on replacing her potassium after she received the magnesium. Agreeable to plan.

## 2023-11-17 NOTE — Progress Notes (Signed)
 Initial Nutrition Assessment  DOCUMENTATION CODES:   Severe malnutrition in context of chronic illness, Underweight  INTERVENTION:   Trial of Mixed Armed forces operational officer Cup with Lunch and Dinner  HS snack  Liberalize diet to Regular to improve choice   NUTRITION DIAGNOSIS:   Severe Malnutrition related to chronic illness (COPD) as evidenced by severe muscle depletion, severe fat depletion.  GOAL:   Patient will meet greater than or equal to 90% of their needs  MONITOR:   PO intake, Supplement acceptance  REASON FOR ASSESSMENT:   Consult Assessment of nutrition requirement/status  ASSESSMENT:   Pt with PMH of nonischemic cardiomyopathy, MR, SVT, GERD, HLD, IBS, COPD on 3L , CHF recent ankle fx after fall admitted from her SNF for atrial tachycardia, Afib with RVR.     Pt noted to be volume overloaded during TEE, lasix held due to low potassium   Spoke with pt who reports that her appetite is fair. Pt has liked food choices here but reports that she is picky with her food. She does not like any supplements like Boost or Ensure. She has been living at [facility] after recent ankle fx and does not like the food there. She has family visit 3 times per week. Pt wears dentures and report that they fit well. Per pt usual weight is 90 lb however noted in chart review pt has weighed less during previous admissions.   3/17 - s/p TEE with cardioversion    Medications reviewed and include: lasix 40 mg BID, protonix  Amiodarone 1 g mag sulfate x 1 IV 10 mEq KCl x 6 IV   Labs reviewed:  K 4.0     NUTRITION - FOCUSED PHYSICAL EXAM:  Flowsheet Row Most Recent Value  Orbital Region Severe depletion  Upper Arm Region Severe depletion  Thoracic and Lumbar Region Severe depletion  Buccal Region Severe depletion  Temple Region Severe depletion  Clavicle Bone Region Severe depletion  Clavicle and Acromion Bone Region Severe depletion  Scapular Bone Region Severe depletion  Dorsal Hand  Severe depletion  Patellar Region Severe depletion  Anterior Thigh Region Severe depletion  Posterior Calf Region Severe depletion  Edema (RD Assessment) None  Hair Reviewed  Eyes Reviewed  Mouth Reviewed  [dentures]  Skin Reviewed  Nails Unable to assess  [artificial nails]       Diet Order:   Diet Order             Diet regular Room service appropriate? Yes with Assist; Fluid consistency: Thin  Diet effective now                   EDUCATION NEEDS:   Education needs have been addressed  Skin:  Skin Assessment: Reviewed RN Assessment  Last BM:  3/19 medium  Height:   Ht Readings from Last 1 Encounters:  11/14/23 5' (1.524 m)    Weight:   Wt Readings from Last 1 Encounters:  11/17/23 39.6 kg    BMI:  Body mass index is 17.05 kg/m.  Estimated Nutritional Needs:   Kcal:  1300-1500  Protein:  65-75 grams  Fluid:  >1.5 L/day  Cammy Copa., RD, LDN, CNSC See AMiON for contact information

## 2023-11-17 NOTE — Progress Notes (Signed)
 Heart Failure Nurse Navigator Progress Note  PCP: Melida Quitter, PA PCP-Cardiologist: Hochrein Admission Diagnosis: Atrial fibrillation with RVR, Shortness of Breath.  Admitted from: Home via EMS  Presentation:   [name] presented with shortness of breath and palpitations for 2 days, When EMS arrived patient  heart rate between 190-220 Bpm. Patient normally wears 3 L of oxygen at home. BNP 891, D-Dimer 4.03, patient started on diltiazem. CTA negative for PE, did show pleural effusions. TTE was done on 11/15/2023 and showed an LVEF of 60 to 65%, no regional wall motion abnormalities, normal RV systolic function, severely dilated left atrium, severely dilated right atrium, moderate pericardial effusion with no evidence of tamponade, severe mitral regurgitation, dilated ascending aorta measuring 41 mm, right atrial pressure was estimated at 15 mmHg. TEE with cardioversion was done on 11/16/23 by Dr Servando Salina. LVEF was estimated at 55-60%. The mitral valve had severe regurgitation and the left atrium was dilated. Mild to moderate mitral regurgitation was present. Trivial pericardial effusion was also present. Had a left sided pleual effusion  . Plan for left and right heart cath in about 4 weeks after discharge.   Patient was educated on the sign and symptoms of heart failure, daily weights, diet/ fluid restrictions, and taking all medications as needed, and attending all medical appointments. Patient reported that she really only drinks about 24 oz of water daily. She verbalized her understanding of the education and stated that she still drives herself to her appointments and makes sure she takes her medications as prescribed. A HF TOC appointment was scheduled for 11/24/2023 @ 2:45 pm.   ECHO/ LVEF: 55-60 %  Clinical Course:  Past Medical History:  Diagnosis Date   Anemia    B12 deficient   Arthritis    B12 deficiency    Breast cancer (HCC)    left breast.   Chronic SI joint pain     Bilateral, Dr. Corliss Skains   COPD (chronic obstructive pulmonary disease) (HCC)    Fibromyalgia    Dr. Corliss Skains   GERD (gastroesophageal reflux disease)    Greater trochanteric bursitis of both hips    Dr. Corliss Skains   Hyperlipidemia    IBS (irritable bowel syndrome)    LV dysfunction    iatrogenic from chemotherapy ; on Carvedilol    Migraines    Osteoporosis    Dr Lomax---> transferring to a new gyn   Oxygen dependent    3 liters     Social History   Socioeconomic History   Marital status: Widowed    Spouse name: Not on file   Number of children: 1   Years of education: Not on file   Highest education level: 12th grade  Occupational History   Occupation: retired, Warehouse manager   Tobacco Use   Smoking status: Former    Current packs/day: 0.00    Average packs/day: 1 pack/day for 30.0 years (30.0 ttl pk-yrs)    Types: Cigarettes    Start date: 08/31/1960    Quit date: 08/31/1990    Years since quitting: 33.2    Passive exposure: Never   Smokeless tobacco: Never  Vaping Use   Vaping status: Never Used  Substance and Sexual Activity   Alcohol use: No   Drug use: Not Currently    Types: Marijuana    Comment: 08/01/2017 last used    Sexual activity: Not Currently  Other Topics Concern   Not on file  Social History Narrative   Lives w/ husband   Social  Drivers of Health   Financial Resource Strain: Low Risk  (11/17/2023)   Overall Financial Resource Strain (CARDIA)    Difficulty of Paying Living Expenses: Not hard at all  Food Insecurity: Food Insecurity Present (11/14/2023)   Hunger Vital Sign    Worried About Running Out of Food in the Last Year: Sometimes true    Ran Out of Food in the Last Year: Sometimes true  Transportation Needs: No Transportation Needs (11/17/2023)   PRAPARE - Administrator, Civil Service (Medical): No    Lack of Transportation (Non-Medical): No  Physical Activity: Inactive (05/11/2023)   Exercise Vital Sign    Days of Exercise per  Week: 0 days    Minutes of Exercise per Session: 0 min  Stress: No Stress Concern Present (05/11/2023)   [employer] of Occupational Health - Occupational Stress Questionnaire    Feeling of Stress : Not at all  Social Connections: Socially Isolated (11/14/2023)   Social Connection and Isolation Panel [NHANES]    Frequency of Communication with Friends and Family: More than three times a week    Frequency of Social Gatherings with Friends and Family: More than three times a week    Attends Religious Services: Never    Database administrator or Organizations: No    Attends Banker Meetings: Never    Marital Status: Widowed   Education Assessment and Provision:  Detailed education and instructions provided on heart failure disease management including the following:  Signs and symptoms of Heart Failure When to call the physician Importance of daily weights Low sodium diet Fluid restriction Medication management Anticipated future follow-up appointments  Patient education given on each of the above topics.  Patient acknowledges understanding via teach back method and acceptance of all instructions.  Education Materials:  "Living Better With Heart Failure" Booklet, HF zone tool, & Daily Weight Tracker Tool.  Patient has scale at home: yes Patient has pill box at home: yes    High Risk Criteria for Readmission and/or Poor Patient Outcomes: Heart failure hospital admissions (last 6 months): 0  No Show rate: 5 % Difficult social situation: Lives alone in apartment Demonstrates medication adherence: yes Primary Language: English Literacy level: Reading, writing, and comprehension.  Barriers of Care:   Diet/ fluid restrictions ( salt)  Daily weights  Considerations/Referrals:   Referral made to Heart Failure Pharmacist Stewardship: Yes Referral made to Heart Failure CSW/NCM TOC: No, reports she still drives herself to appointments Referral made to Heart &  Vascular TOC clinic: yes, 11/24/2023 @ 2:45 pm.   Items for Follow-up on DC/TOC: Continued HF education Diet/ fluid restrictions ( salt) Daily weights   Rhae Hammock, BSN, RN Heart Failure Teacher, adult education Only

## 2023-11-17 NOTE — Progress Notes (Addendum)
 Patient Name: [name] Date of Encounter: 11/17/2023 [name] Cardiologist: Rachael Rotunda, MD   Interval Summary  .    Has been feeling well over the past day. Denies any shortness of breath, palpitations, chest pain, abdominal pain, diaphoresis. Has a sore throat and has discomfort when swallowing medications after getting the TEE.  Vital Signs .    Vitals:   11/16/23 1538 11/16/23 2008 11/16/23 2315 11/17/23 0505  BP: (!) 98/59 107/60 125/62 107/64  Pulse:  88 79 79  Resp:  (!) 24 (!) 22 19  Temp:  99.4 F (37.4 C)    TempSrc:  Oral    SpO2: 100% 100% 100% 100%  Weight:    39.6 kg  Height:        Intake/Output Summary (Last 24 hours) at 11/17/2023 0713 Last data filed at 11/17/2023 0456 Gross per 24 hour  Intake 1803.36 ml  Output 850 ml  Net 953.36 ml      11/17/2023    5:05 AM 11/16/2023    4:09 AM 11/14/2023   12:38 PM  Last 3 Weights  Weight (lbs) 87 lb 4.8 oz 77 lb 9.6 oz 92 lb  Weight (kg) 39.599 kg 35.2 kg 41.731 kg      Telemetry/ECG    Had an episode of ventricular bigeminy on 11/16/23 at 2200. Went into atrial fibrillation with a rate in the 100's on 11/17/23 from about 0215-0415. After that went back into normal sinus rhythm with rates in the 70's and 80's - Personally Reviewed  Physical Exam .   GEN: No acute distress. Frail female on 3 L [place]: No JVD Cardiac: RRR, systolic ejection murmur,   Respiratory:  wheezing heard in both lungs. MS: 1+ edema  Assessment & Plan .     [name] is a 73 y.o. female with a hx of Nonischemic cardiomyopathy, paroxysmal atrial fibrillation, SVT, hyperlipidemia, B12 deficiency, COPD, fibromyalgia, GERD, and breast cancer who is being seen 11/15/2023 for the evaluation of atrial fibrillation at the request of Rachael Narendranath MD.   Acute heart failure with improved ejection fraction Severe mitral regurgitation (previously mild on echo in 11/2022) Moderate pericardial effusion An  TTE was done on 11/15/2023 and showed an LVEF of 60 to 65%, no regional wall motion abnormalities, normal RV systolic function, severely dilated left atrium, severely dilated right atrium, moderate pericardial effusion with no evidence of tamponade, severe mitral regurgitation, dilated ascending aorta measuring 41 mm, right atrial pressure was estimated at 15 mmHg. - TEE with cardioversion was done on 11/16/23 by [name]. LVEF was estimated at 55-60%. The mitral valve had severe regurgitation and the left atrium was dilated. Mild to moderate mitral regurgitation was present. Trivial pericardial effusion was also present. Had a left sided pleual effusion - Was volume overloaded on imaging and was initially diuresed on IV lasix. Lasix was held on 11/16/22 due to hypokalema of 2.9 and the scheduled TEE. See hypokalemia below.   - Start IV lasix 40 mg twice daily - Reach out to structural heart team to help decide if she is a good candidate for mitraclip. - Order chest x-ray, and BNP  - The patient will need potassium replacement outpatient due to hypokalemia. Will consider potassium powder due to difficulty swallowing. - Needs outpatient follow up with cardiology 1 week after discharge. - Plan for left and right heart cath in about 4 weeks as the patient had recent cardioversion and we want to continue the Eliquis  for several weeks without interuption. Following this will plan for a referral to the structural heart team to assess if she is a candidate for mitraclip.     Paroxysmal atrial flutter CHA2DS2-VASc 6.  CHF 1, vascular disease 1, female 1, age 36, Prior stroke 2. -Was initially found during COPD exacerbation on 11/2022. - Was initially rate controled on IV cardizem. IV cardizem was held due to low blood pressures. Oral cardizem and metoprolol were started. The patient went back into atrial fibrillation with RVR over night. The oral metoprolol was stopped and IV amiodarone with bolus was started. The  patient had a TEE with DCCV done on 11/16/23 but went back into atrial fibrillation the next day for a couple of hours.  - TSH was normal at 2.991 - Continue Eliquis 5 mg twice daily. - Continue Oral Cardizem 120 mg daily.   - Continue IV amiodarone for one more day due to recent episode of atrial fibrillation. Will consider transitioning to oral amiodarone tomorrow.   Hypokalemia - potassium was 2.9 on 11/16/23 repeat potassium was also low at 2.8. Potassium replenishment was ordered. The patient had difficulty swallowing potassium pills so was switched to IV potassium chloride every 6 hours. Potassium on 11/17/23 had improved to 3.4.   - Repeat potassium for 10am was ordered.   - Continue IV potassium chloride every 6 hours.   Coronary artery disease - CAD was seen on pulmonary CT on 11/14/2023 - Continue atorvastatin 40 mg daily     Hyperlipidemia - LDL on 08/30/2023 was 160 - LDL on 11/16/23 was 131. Goal is less than 70. - Continue atorvastatin 40 mg daily.   Normocytic anemia -Hemoglobin on 11/15/2023 was 9.0. on 11/16/23 it improved to 10.1.  Baseline hemoglobin appears to be between 12 and 14. - Management per primary     COPD - likely contributing to shortness of breath. - Management per primary    Pyelonephritis - was seen on CT abdomen - Currently on ciprofloxacin - Suspect that the pyelonephritis contributed to the atrial fibrillation with RVR. - management per primary   Management per primary GERD Fibromyalgia  For questions or updates, please contact [place] Please consult [email or web address] for contact info under        Signed, Rachael Merles, PA-C    I have personally seen and examined the patient.  My HPI, Exam, and assessment and plan are below, independent of the NPP above.  [name] is a 73 year old female with severe mitral regurgitation and atrial fibrillation who presents with shortness of breath and fatigue. She has a  history of severe mitral regurgitation, which has progressed from mild over the past year. A recent transesophageal echocardiogram showed a reversal of pulmonary vein flow and sufficient posterior leaflet thickness and height. She has a history of paroxysmal atrial fibrillation for about a year, contributing to severe left atrial dilation. Anticoagulation therapy is ongoing to prevent thromboembolic events. A left-sided pleural effusion was noted on a recent transesophageal echocardiogram, contributing to her shortness of breath. She has a small to moderate pericardial effusion without evidence of tamponade, which is not considered a major factor in her current clinical picture. She requires three liters of oxygen at home due to significant chronic obstructive pulmonary disease, which contributes to her overall respiratory status and may impact her candidacy for further cardiac interventions.   Exam notable for  Gen: no distress, mild distress   Neck: No JVD Cardiac: No Rubs or Gallops,  systolic murmur, RRR, +2 radial pulses Respiratory: wheezes bilaterally,  normal effort, normal  respiratory rate GI: Soft, nontender, non-distended  MS: No edema;  moves all extremities Integument: Skin feels warm Neuro:  At time of evaluation, alert and oriented to person/place/time/situation  Psych: Normal affect, patient feels better  PAF with transition to SR on telemetry presently.  Patient has a TEE: Severe Mitral regurgitation: Atrial function Severe LA dilation with sufficient height for TSP ~ sufficient posterior leaflet distance; no MS - suitable for mTEER  In assessment and plan:   Severe Mitral Regurgitation (MR) Severe mitral regurgitation, primarily functional with some prolapse, and significant left atrial dilation likely due to prolonged atrial fibrillation. Mitral valve anatomy suggests potential candidacy for MitraClip procedure, pending Structural Heart team evaluation.  No evidence of  tamponade. Comorbidities may affect eligibility but she notes prior to her ankle issues she was very functional - Continue evaluation for MitraClip candidacy with Structural Heart team as outpatient.   Left-sided Pleural Effusion Left-sided pleural effusion identified on transesophageal echocardiogram, contributing to dyspnea. Managed with diuretics.  - Continue IV diuretics. -  Order basic natriuretic peptide (BNP) test. - - Order chest x-ray.   PAF - AC - IV Amio to PO tomorrow  Riley Lam, MD FASE [facility] Cardiologist [facility]  [address] [place], [address] [place], [place] [address] [phone]  1:04 PM

## 2023-11-17 NOTE — Progress Notes (Signed)
   Heart Failure Stewardship Pharmacist Progress Note   PCP: Melida Quitter, PA PCP-Cardiologist: Rollene Rotunda, MD    HPI:  73 y.o. female with PMHx of GAD, osteoporosis, fibromyalgia, IBS, DDD, HLD, COPD, and ILD on 3L of home O2.  She arrived to [facility] ED on 03/16 with complaints of palpitations. She noticed this symptom over the last few days. She also reported SOB and a dry cough. She appeared to be in A-fib with RVR. HR was 150-160s with EMS. Tachycardia persisted despite diltiazem 10 mg twice. CXR on 03/16 showed bilateral lung base opacities (new), suspected combination of atelectasis/pneumonia and small bilateral effusions. No evidence of pulmonary edema. Chest CT on 03/16 showed cardiomegaly with small pericardial effusion. ECHO on 03/17 revealed LVEF of 60-65% (unchanged from 11/2022), no regional wall motion abnormalities, normal PA pressure, moderate pericardial effusion posterior to left ventricular, no evidence of cardiac tamponade, abnormal MV, severe MV regurgitation, and right atrial pressure of 15 mmHg. ECHO TEE on 03/18 revealed LVEF 55-60%, no L/R atrial appendage thrombus, abnormal MV and severe MV regurgitation.  Today patient reported fatigue, weakness, and orthopnea. She is still short of breath and generally feels the same as yesterday. Physical exam revealed 1+ bilateral lower extremity edema.   Current HF Medications: Diuretic: Furosemide 40 mg IV Q12H  Beta Blocker: Metoprolol tartrate 25 mg every day (stopped 03/18 for low BP and possible COPD exacerbation) Other: Potassium 10 mEq IV infusion x 6 + Mg sulfate 1 g once   Prior to admission HF Medications: Beta blocker: Metoprolol tartrate 25 mg every day (last dispensed in March for 30 ds)  Pertinent Lab Values: Serum creatinine 0.71, BUN 8, Potassium 3.4, Sodium 139, BNP 891, Magnesium 2.3, A1c 5.1  Vital Signs: Weight: 87.3 lbs (admission weight: 91.9 lbs) Blood pressure: 91/55-125/62  (103/82 @0726 ) Heart rate: 72-80 (80 @0726 ) I/O: net +0.85 L yesterday; net +0.902 L since admission  Medication Assistance / Insurance Benefits Check: Does the patient have prescription insurance?  Yes Type of insurance plan: [facility]  Outpatient Pharmacy:  Prior to admission outpatient pharmacy:  [id number] - [place], [place] - [address]  Is the patient willing to use [facility] TOC pharmacy at discharge? Yes Is the patient willing to transition their outpatient pharmacy to utilize a [facility] outpatient pharmacy?   No    Assessment: 1. Acute on chronic HFpEF (LVEF 60-65%). NYHA class III symptoms. - Strict I/Os and daily weights. Keep K>4, Mg>2 -Restart Furosemide 40 mg IV Q12H -Continue to hold metoprolol tartrate for hypotension -Consider MRA when BP is normotensive -Consider initiation of SGLT2i prior to discharge pending pyelonephritis resolution.     Plan: 1) Medication changes recommended at this time: -Consider initiation of SGLT2i  prior to discharge pending pyelonephritis resolution -Consider MRA when BP is normotensive  2) Patient assistance: -Ran test claim for Farxiga 10 mg and the current 30 day co-pay is $47.00. -Ran test claim for Jardiance 10 mg and the current 30 day co-pay is $47.00. -Revisit affordability conversation tomorrow  3)  Education  - To be completed prior to discharge  Sofie Rower, PharmD [employer] PGY-1

## 2023-11-18 ENCOUNTER — Encounter (HOSPITAL_COMMUNITY): Payer: Self-pay | Admitting: Cardiology

## 2023-11-18 ENCOUNTER — Telehealth: Payer: Self-pay

## 2023-11-18 DIAGNOSIS — I4719 Other supraventricular tachycardia: Secondary | ICD-10-CM | POA: Diagnosis not present

## 2023-11-18 DIAGNOSIS — I4892 Unspecified atrial flutter: Secondary | ICD-10-CM | POA: Diagnosis not present

## 2023-11-18 DIAGNOSIS — E876 Hypokalemia: Secondary | ICD-10-CM | POA: Diagnosis not present

## 2023-11-18 DIAGNOSIS — I340 Nonrheumatic mitral (valve) insufficiency: Secondary | ICD-10-CM | POA: Diagnosis not present

## 2023-11-18 DIAGNOSIS — I509 Heart failure, unspecified: Secondary | ICD-10-CM | POA: Diagnosis not present

## 2023-11-18 LAB — BASIC METABOLIC PANEL
Anion gap: 9 (ref 5–15)
BUN: 6 mg/dL — ABNORMAL LOW (ref 8–23)
CO2: 32 mmol/L (ref 22–32)
Calcium: 8.2 mg/dL — ABNORMAL LOW (ref 8.9–10.3)
Chloride: 97 mmol/L — ABNORMAL LOW (ref 98–111)
Creatinine, Ser: 0.8 mg/dL (ref 0.44–1.00)
GFR, Estimated: >60 mL/min (ref >60)
Glucose, Bld: 130 mg/dL — ABNORMAL HIGH (ref 70–99)
Potassium: 3.4 mmol/L — ABNORMAL LOW (ref 3.5–5.1)
Sodium: 138 mmol/L (ref 135–145)

## 2023-11-18 LAB — BRAIN NATRIURETIC PEPTIDE: B Natriuretic Peptide: 341.9 pg/mL — ABNORMAL HIGH (ref 0.0–100.0)

## 2023-11-18 LAB — NOROVIRUS GROUP 1 & 2 BY PCR, STOOL
Norovirus 1 by PCR: NEGATIVE
Norovirus 2  by PCR: NEGATIVE

## 2023-11-18 LAB — MAGNESIUM: Magnesium: 1.8 mg/dL (ref 1.7–2.4)

## 2023-11-18 MED ORDER — POTASSIUM CHLORIDE 10 MEQ/100ML IV SOLN
10.0000 meq | INTRAVENOUS | Status: AC
  Administered 2023-11-18 (×2): 10 meq via INTRAVENOUS
  Filled 2023-11-18 (×2): qty 100

## 2023-11-18 MED ORDER — AMIODARONE HCL 200 MG PO TABS: 200.0000 mg | ORAL_TABLET | Freq: Every day | ORAL | Status: DC

## 2023-11-18 MED ORDER — POTASSIUM CHLORIDE CRYS ER 20 MEQ PO TBCR: 40.0000 meq | EXTENDED_RELEASE_TABLET | Freq: Two times a day (BID) | ORAL | Status: DC

## 2023-11-18 MED ORDER — POTASSIUM CHLORIDE CRYS ER 20 MEQ PO TBCR
40.0000 meq | EXTENDED_RELEASE_TABLET | Freq: Once | ORAL | Status: DC
  Filled 2023-11-18: qty 2

## 2023-11-18 MED ORDER — AMIODARONE HCL 200 MG PO TABS
400.0000 mg | ORAL_TABLET | Freq: Two times a day (BID) | ORAL | Status: DC
  Administered 2023-11-18 – 2023-11-22 (×9): 400 mg via ORAL
  Filled 2023-11-18 (×9): qty 2

## 2023-11-18 MED ORDER — ADULT MULTIVITAMIN W/MINERALS CH
1.0000 | ORAL_TABLET | Freq: Every day | ORAL | Status: DC
  Administered 2023-11-18 – 2023-11-22 (×5): 1 via ORAL
  Filled 2023-11-18 (×5): qty 1

## 2023-11-18 MED ORDER — HYDROXYZINE HCL 10 MG PO TABS: 10.0000 mg | ORAL_TABLET | Freq: Four times a day (QID) | ORAL | Status: DC | PRN

## 2023-11-18 MED ORDER — ONDANSETRON HCL 4 MG/2ML IJ SOLN
4.0000 mg | Freq: Four times a day (QID) | INTRAMUSCULAR | Status: DC | PRN
  Administered 2023-11-18 – 2023-11-20 (×4): 4 mg via INTRAVENOUS
  Filled 2023-11-18 (×5): qty 2

## 2023-11-18 MED ORDER — FUROSEMIDE 10 MG/ML IJ SOLN
80.0000 mg | Freq: Two times a day (BID) | INTRAMUSCULAR | Status: DC
Start: 2023-11-18 — End: 2023-11-19
  Administered 2023-11-18 – 2023-11-19 (×2): 80 mg via INTRAVENOUS
  Filled 2023-11-18 (×2): qty 8

## 2023-11-18 MED ORDER — POTASSIUM CHLORIDE CRYS ER 20 MEQ PO TBCR
20.0000 meq | EXTENDED_RELEASE_TABLET | Freq: Every day | ORAL | Status: DC
  Filled 2023-11-18: qty 1

## 2023-11-18 MED ORDER — MAGNESIUM OXIDE -MG SUPPLEMENT 400 (240 MG) MG PO TABS
400.0000 mg | ORAL_TABLET | Freq: Every day | ORAL | Status: DC
  Administered 2023-11-18 – 2023-11-22 (×5): 400 mg via ORAL
  Filled 2023-11-18 (×5): qty 1

## 2023-11-18 MED ORDER — DOCUSATE SODIUM 100 MG PO CAPS
100.0000 mg | ORAL_CAPSULE | Freq: Every day | ORAL | Status: DC
  Administered 2023-11-19 – 2023-11-22 (×4): 100 mg via ORAL
  Filled 2023-11-18 (×4): qty 1

## 2023-11-18 NOTE — Progress Notes (Signed)
 Rounding Note    Patient Name: [name] Date of Encounter: 11/18/2023  [facility] Cardiologist: Rollene Rotunda, MD   Subjective   Denies any CP or SOB. Has been on 24/7 3L home O2 since last April. Currently on 3L [place] here.   Inpatient Medications    Scheduled Meds:  apixaban  5 mg Oral BID   atorvastatin  40 mg Oral Daily   citalopram  20 mg Oral q AM   diltiazem  120 mg Oral Daily   furosemide  40 mg Intravenous BID   pantoprazole  40 mg Oral Daily   potassium chloride  20 mEq Oral Daily   potassium chloride  40 mEq Oral Once   umeclidinium bromide  1 puff Inhalation Daily   Continuous Infusions:  amiodarone 30 mg/hr (11/18/23 0439)   ciprofloxacin Stopped (11/18/23 0520)   PRN Meds: acetaminophen **OR** acetaminophen, acetaminophen, levalbuterol, oxyCODONE, promethazine   Vital Signs    Vitals:   11/17/23 0726 11/17/23 1956 11/18/23 0550 11/18/23 0728  BP: 103/82 103/63 105/69   Pulse: 80 93 81 86  Resp: 17 17 18  (!) 21  Temp: 98 F (36.7 C) 99.4 F (37.4 C) 99 F (37.2 C)   TempSrc: Oral Oral Oral   SpO2: 100% 100% 99% 99%  Weight:   39.9 kg   Height:        Intake/Output Summary (Last 24 hours) at 11/18/2023 0951 Last data filed at 11/18/2023 0830 Gross per 24 hour  Intake 1210.96 ml  Output 2100 ml  Net -889.04 ml      11/18/2023    5:50 AM 11/17/2023    5:05 AM 11/16/2023    4:09 AM  Last 3 Weights  Weight (lbs) 87 lb 15.4 oz 87 lb 4.8 oz 77 lb 9.6 oz  Weight (kg) 39.9 kg 39.599 kg 35.2 kg      Telemetry    NSR with PACs, no significant ventricular ectopy or recurrent afib - Personally Reviewed  ECG    NSR, suspect T wave inversion in lateral leads the result of motion artifact - Personally Reviewed  Physical Exam   GEN: No acute distress.   Neck: No JVD Cardiac: RRR, no murmurs, rubs, or gallops.  Respiratory: Diffusely diminished breath sound with crackles in bilateral bases.  No obvious wheezing or  rhonchi. GI: Soft, nontender, non-distended  MS: No edema; No deformity. Neuro:  Nonfocal  Psych: Normal affect   Labs    High Sensitivity Troponin:  No results for input(s): "TROPONINIHS" in the last 720 hours.   Chemistry Recent Labs  Lab 11/15/23 0400 11/16/23 0631 11/16/23 1133 11/17/23 0408 11/17/23 0857 11/18/23 0446  NA 138 137  --  139 139 138  K 4.4 2.9*   < > 3.4* 4.0 3.4*  CL 95* 89*  --  96* 97* 97*  CO2 28 34*  --  30 33* 32  GLUCOSE 120* 91  --  140* 133* 130*  BUN 8 9  --  8 <5* 6*  CREATININE 0.72 0.70  --  0.71 0.68 0.80  CALCIUM 8.5* 8.6*  --  8.4* 8.5* 8.2*  MG  --  1.6*  --  2.3  --  1.8  PROT 6.7  --   --   --   --   --   ALBUMIN 3.1*  --   --   --   --   --   AST 65*  --   --   --   --   --  ALT 17  --   --   --   --   --   ALKPHOS 61  --   --   --   --   --   BILITOT 1.9*  --   --   --   --   --   GFRNONAA >60 >60  --  >60 >60 >60  ANIONGAP 15 14  --  13 9 9    < > = values in this interval not displayed.    Lipids  Recent Labs  Lab 11/16/23 0631  CHOL 202*  TRIG 94  HDL 52  LDLCALC 131*  CHOLHDL 3.9    Hematology Recent Labs  Lab 11/15/23 0120 11/15/23 0400 11/16/23 0631  WBC 10.2 9.0 7.9  RBC 3.21* 3.13* 3.53*  HGB 9.3* 9.0* 10.1*  HCT 29.6* 28.5* 31.1*  MCV 92.2 91.1 88.1  MCH 29.0 28.8 28.6  MCHC 31.4 31.6 32.5  RDW 14.0 13.8 13.8  PLT 475* 575* 708*   Thyroid  Recent Labs  Lab 11/14/23 1250  TSH 2.991    BNP Recent Labs  Lab 11/14/23 1250 11/18/23 0446  BNP 891.7* 341.9*    DDimer  Recent Labs  Lab 11/14/23 1250  DDIMER 4.03*     Radiology    DG CHEST PORT 1 VIEW Result Date: 11/17/2023 CLINICAL DATA:  Heart failure. EXAM: PORTABLE CHEST 1 VIEW COMPARISON:  Chest radiographs 11/14/2023 and 12/22/2022; CT chest 11/14/2023 FINDINGS: Cardiac silhouette and mediastinal contours are within limits. There is a right basilar heterogeneous airspace opacity similar to prior. Small bilateral pleural effusions  obscure the costophrenic angles, similar to prior. No pneumothorax. Mild dextrocurvature of the lower thoracic spine. Surgical clips overlie the lateral aspect of the lateral chest walls and the left axilla, related to bilateral 1 mastectomies and left axillary lymph node the sections seen on prior CT. There lucent cystic emphysematous changes again seen. Mild levocurvature of the upper thoracic spine and mild dextrocurvature of the thoracolumbar junction. ACDF hardware overlies the lower cervical spine. IMPRESSION: 1. Right basilar heterogeneous airspace opacity, similar to 11/14/2023 but new from 12/22/2022. This may represent atelectasis or pneumonia. 2. Small bilateral pleural effusions, also similar to 11/14/2023 but new from 12/22/2022. 3. Chronic emphysematous changes. Electronically Signed   By: Neita Garnet M.D.   On: 11/17/2023 16:54   ECHO TEE Result Date: 11/16/2023    TRANSESOPHOGEAL ECHO REPORT   Patient Name:   [name] Date of Exam: 11/16/2023 Medical Rec #:  [id number]         Height:       60.0 in Accession #:    [id number]        Weight:       77.6 lb Date of Birth:  20-Apr-1951        BSA:          1.248 m Patient Age:    72 years          BP:           111/66 mmHg Patient Gender: F                 HR:           99 bpm. Exam Location:  Inpatient Procedure: Color Doppler, Cardiac Doppler and Transesophageal Echo (Both            Spectral and Color Flow Doppler were utilized during procedure). Indications:     Cardioversion  History:  Patient has prior history of Echocardiogram examinations, most                  recent 11/15/2023.  Sonographer:     Harriette Bouillon RDCS Referring Phys:  [id number] ZANE ADAMS Diagnosing Phys: Thomasene Ripple DO PROCEDURE: The transesophogeal probe was passed without difficulty through the esophogus of the patient. Sedation performed by different physician. The patient developed no complications during the procedure.  IMPRESSIONS  1. Left ventricular ejection  fraction, by estimation, is 55 to 60%. The left ventricle has normal function.  2. Right ventricular systolic function is normal. The right ventricular size is normal.  3. Left atrial size was mildly dilated. No left atrial/left atrial appendage thrombus was detected.  4. The mitral valve is abnormal. Severe mitral valve regurgitation. No evidence of mitral stenosis.  5. The tricuspid valve is abnormal. Tricuspid valve regurgitation is mild to moderate.  6. The aortic valve is tricuspid. Aortic valve regurgitation is trivial. No aortic stenosis is present.  7. There is borderline dilatation of the ascending aorta, measuring 36 mm. FINDINGS  Left Ventricle: Left ventricular ejection fraction, by estimation, is 55 to 60%. The left ventricle has normal function. The left ventricular internal cavity size was small. Right Ventricle: The right ventricular size is normal. No increase in right ventricular wall thickness. Right ventricular systolic function is normal. Left Atrium: Left atrial size was mildly dilated. No left atrial/left atrial appendage thrombus was detected. Right Atrium: Right atrial size was not well visualized. Pericardium: Trivial pericardial effusion is present. The pericardial effusion is circumferential. Mitral Valve: The mitral valve is abnormal. Severe mitral valve regurgitation. No evidence of mitral valve stenosis. Tricuspid Valve: The tricuspid valve is abnormal. Tricuspid valve regurgitation is mild to moderate. No evidence of tricuspid stenosis. Aortic Valve: The aortic valve is tricuspid. Aortic valve regurgitation is trivial. No aortic stenosis is present. Pulmonic Valve: The pulmonic valve was not well visualized. Pulmonic valve regurgitation is trivial. No evidence of pulmonic stenosis. Aorta: There is borderline dilatation of the ascending aorta, measuring 36 mm. There is minimal (Grade I) layered plaque involving the aortic root. Pulmonary Artery: The pulmonary artery is of normal size.  IAS/Shunts: No atrial level shunt detected by color flow Doppler.   AORTA Ao Asc diam: 3.60 cm Lavona Mound Tobb DO Electronically signed by Thomasene Ripple DO Signature Date/Time: 11/16/2023/6:05:04 PM    Final    EP STUDY Result Date: 11/16/2023 See surgical note for result.   Cardiac Studies   Echo 11/15/2023 1. Left ventricular ejection fraction, by estimation, is 60 to 65%. The  left ventricle has normal function. The left ventricle has no regional  wall motion abnormalities. Left ventricular diastolic function could not  be evaluated.   2. Right ventricular systolic function is normal. The right ventricular  size is normal. There is normal pulmonary artery systolic pressure. The  estimated right ventricular systolic pressure is 35.8 mmHg.   3. Left atrial size was severely dilated.   4. Right atrial size was severely dilated.   5. Moderate pericardial effusion. The pericardial effusion is posterior  to the left ventricle. There is no evidence of cardiac tamponade.   6. The mitral valve is abnormal. Severe mitral valve regurgitation. No  evidence of mitral stenosis.   7. The aortic valve is tricuspid. There is mild calcification of the  aortic valve. Aortic valve regurgitation is not visualized. Aortic valve  sclerosis/calcification is present, without any evidence of aortic  stenosis.   8. Aortic  dilatation noted. There is mild dilatation of the ascending  aorta, measuring 41 mm.   9. The inferior vena cava is dilated in size with <50% respiratory  variability, suggesting right atrial pressure of 15 mmHg.    TEE 11/16/2023 1. Left ventricular ejection fraction, by estimation, is 55 to 60%. The  left ventricle has normal function.   2. Right ventricular systolic function is normal. The right ventricular  size is normal.   3. Left atrial size was mildly dilated. No left atrial/left atrial  appendage thrombus was detected.   4. The mitral valve is abnormal. Severe mitral valve  regurgitation. No  evidence of mitral stenosis.   5. The tricuspid valve is abnormal. Tricuspid valve regurgitation is mild  to moderate.   6. The aortic valve is tricuspid. Aortic valve regurgitation is trivial.  No aortic stenosis is present.   7. There is borderline dilatation of the ascending aorta, measuring 36  mm.   Patient Profile     73 y.o. female with PMH of NICM in 2010 after breast CA treatment with improved EF, PAF, SVT, HLD, COPD, fibromyalgia, GERD and breast CA who presented with 2 days of SOB and found to be in afib with RVR.   Assessment & Plan    Acute diastolic heart failure with severe MR  - interstitial pulmonary edema with mild pleural effusion  - TTE 11/15/2023 EF 60 to 65%, no regional wall motion abnormality, RVSP 35.8 mmHg, severe biatrial enlargement, moderate pericardial effusion, severe MR, mildly dilated ascending aorta 41 mm  -TEE 11/16/2023 EF 55 to 60%, normal RV, severe MR, mild to moderate TR, trivial AI  -Plan for structural heart team evaluation as outpatient for MitraClip.  -I/O not accurate. Patient does not appears to be significantly volume overload, unclear if crackles in lung base are fluid vs atelectasis.  IV diuresis as renal function allows  PAF -Although questionable A-fib during previous hospitalization in 2024, however 2 subsequent heart monitor showed no evidence of atrial fibrillation. -She arrived at this time with A-fib with RVR.  Started on Eliquis, diltiazem and amiodarone.  TSH normal.  Underwent TEE DCCV 11/16/2023. -Transition amiodarone to oral, 400 mg twice daily for 1 week then 200 mg daily thereafter.  Coronary artery calcification: Denies any chest pain  COPD: On chronic 3 L oxygen at home 24/7 since April 2024  Hyperlipidemia  Elevated d-dimer: CTA negative for PE       For questions or updates, please contact [facility] Please consult [email or web address] for contact info under        Signed, Azalee Course, PA   11/18/2023, 9:51 AM    I have personally seen and examined the patient.  My HPI, Exam, and assessment and plan are below, independent of the NPP above.  Patient notes improved breathing. No CP, no palpitations.  In SR.  Daughter as bedside  Exam notable for  Gen: no distress, thin, frail Neck: No JVD Cardiac: No Rubs or Gallops, holosystolic murmur, RRR, +2 radial pulses Respiratory: Wheezes bilaterally distal crackles, norma effort, normal  respiratory rate GI: Soft, nontender, non-distended  MS: No  edema;  moves all extremities Integument: Skin feels warm Neuro:  At time of evaluation, alert and oriented to person/place/time/situation  Psych: Normal affect, patient feels ok  Tele: short runs of SVT no AF  In assessment and plan:  Severe, atrial function mitral regurgitation: candidate for mTEER, daughter confirms she was reasonable functional, thought symptomatic, PTA; has structural f/u scheduled Discussed  at length mTEER with patient and family and answered questions Mixed picture acute on chronic hypoxic respiratory failure with Pleural effusion- Increasing IV diuretics and K Atelectasis- IS; gave education on use Mild aortic dilation- will get imaging f/u for the above PAF- tolerated AC, AAD switch to oral today CAC- outpatient managmend with HLD prevention COPD- as per primary, on 3 L  Riley Lam, MD [facility] Cardiologist [facility]  [address] [place], [address] [place], [place] [address] [phone]  11:50 AM

## 2023-11-18 NOTE — Progress Notes (Signed)
 PROGRESS NOTE [name]  NWG:[id number] DOB: [birth year] DOA: 11/14/2023 PCP: Melida Quitter, PA  Brief Narrative/Hospital Course:  73 year old female with medical history significant for nonischemic cardiomyopathy, mitral regurgitation, SVT, GERD, hyperlipidemia, IBS, recent bimalleolar ankle fracture after fall presented from SNF for evaluation of increased heart rate, shortness of breath for 2 days.  Patient is noted to have atrial tachycardia, started on Cardizem drip admitted to [facility] service for further management.  Cardiology consulted, started amiodarone drip, possible TEE/ cardioversion AND being treated for a flutter/A-fib with RVR MAT acute on chronic HFrEF, electrolyte imbalance.   Consultation: Cardiology     Subjective: Seen and examined this morning Resting comfortably denies any complaint Overnight afebrile BP stable on 3 L [place] [facility] with potassium 3.4 creatinine stable BNP 341   Assessment and Plan: Principal Problem:   Atrial tachycardia (HCC) Active Problems:   Hypokalemia   Mixed hyperlipidemia   COPD mixed type (HCC)   Protein-calorie malnutrition, severe (HCC)   Chronic respiratory failure with hypoxia (HCC)   Closed bimalleolar fracture of right ankle   Paroxysmal atrial flutter/A-fib with RVR  Multifocal Atrial tachycardia:  Hx of SVT, [facility] in fall 2024 noted svt  no afib.  Workup with no PE on CTA Cardiology following closely underwent TEE 3/18.  Now on Cardizem off metoprolol due to soft blood pressure/hypertension. Cont amiodarone drip, Eliquis 5 mg twice dail and monitor closely y.   Acute on Chronic HFrEF with recovered EF Severe MR H/o non ischemic cardiomyopathy Left-sided pleural effusion: NICM after treatment of breast cancer, chronic HFrEF with recovery with last EF of 60-65%. 3/18-TEE > EF 55-60%, RV systolic function size is normal severe MR mild TR borderline dilatation of ascending aorta.   Cardiology following closely-she may be a  candidate for [facility] structural heart team to evaluate. Continue to optimize heart rate, blood pressure and fluid status.  Repeat chest x-ray 3/19 small basilar pleural effusion similar to 3/16 right basilar heterogeneous airspace opacity similar to 3/16. Continue IV Lasix 40 twice daily per cardiology. Cont to monitor daily I/O,weight, electrolytes and net balance as below.Keep on  salt/fluid restricted diet and monitor in tele. Net IO Since Admission: -999.96 mL [11/18/23 1021]  Filed Weights   11/16/23 0409 11/17/23 0505 11/18/23 0550  Weight: 35.2 kg 39.6 kg 39.9 kg    Recent Labs  Lab 11/14/23 1250 11/15/23 0120 11/15/23 0400 11/16/23 0631 11/16/23 1133 11/17/23 0408 11/17/23 0857 11/18/23 0446  BNP 891.7*  --   --   --   --   --   --  341.9*  BUN 7*  --  8 9  --  8 <5* 6*  CREATININE 0.62   < > 0.72 0.70  --  0.71 0.68 0.80  K 4.0  --  4.4 2.9* 2.8* 3.4* 4.0 3.4*  MG 1.9  --   --  1.6*  --  2.3  --  1.8   < > = values in this interval not displayed.    Hypokalemia: Hypomagnesemia: In the setting of diuresis please continue to replace and recheck electrolytes.   COPD on chronic oxygen Chronic respiratory failure with hypoxia on 3 L [place]: Continue home 02 and PRN nebs.  At baseline currently   Abdominal discomfort, nausea: Pyelonephritis on CT abdomen Urinalysis fairly unremarkable.started ciprofloxacin 400 mg bid. Due to norovirus outbreak in the facility, Norovirus panel ordered and in process.Continue PPI, antiemetics.   Hyperlipidemia Cont statins.   Osteoporosis: S/p recent repair for bimalleolar ankle  fracture after a fall cont ptot fall precaution, aspiration precaution   Severe malnutrition BMI 15.16 Will get dietitian evaluation. Encourage oral diet, supplements.  DVT prophylaxis: Eliquis Code Status:   Code Status: Full Code Family Communication: plan of care discussed with patient at bedside. Patient status is: Remains hospitalized because of  severity of illness Level of care: Progressive   Dispo: The patient is from: home alone            Anticipated disposition: Planning for skilled nursing facility once cleared by cardiology  Objective: Vitals last 24 hrs: Vitals:   11/17/23 0726 11/17/23 1956 11/18/23 0550 11/18/23 0728  BP: 103/82 103/63 105/69   Pulse: 80 93 81 86  Resp: 17 17 18  (!) 21  Temp: 98 F (36.7 C) 99.4 F (37.4 C) 99 F (37.2 C)   TempSrc: Oral Oral Oral   SpO2: 100% 100% 99% 99%  Weight:   39.9 kg   Height:       Weight change: 0.301 kg  Physical Examination: General exam: alert awake, elderly frail on supplemental oxygen  HEENT:Oral mucosa moist, Ear/Nose WNL grossly Respiratory system: Bilaterally diminished BS,no use of accessory muscle Cardiovascular system: S1 & S2 +, No JVD. Gastrointestinal system: Abdomen soft,NT,ND, BS+ Nervous System: Alert, awake, moving all extremities,and following commands. Extremities: LE edema neg,distal peripheral pulses palpable and warm.  Skin: No rashes,no icterus. MSK: Normal muscle bulk,tone, power   Medications reviewed:  Scheduled Meds:  apixaban  5 mg Oral BID   atorvastatin  40 mg Oral Daily   citalopram  20 mg Oral q AM   diltiazem  120 mg Oral Daily   furosemide  40 mg Intravenous BID   pantoprazole  40 mg Oral Daily   potassium chloride  20 mEq Oral Daily   umeclidinium bromide  1 puff Inhalation Daily   Continuous Infusions:  amiodarone 30 mg/hr (11/18/23 0439)   ciprofloxacin Stopped (11/18/23 0520)    Diet Order             Diet regular Room service appropriate? Yes with Assist; Fluid consistency: Thin  Diet effective now                  Intake/Output Summary (Last 24 hours) at 11/18/2023 1021 Last data filed at 11/18/2023 1012 Gross per 24 hour  Intake 822.43 ml  Output 2725 ml  Net -1902.57 ml   Net IO Since Admission: -999.96 mL [11/18/23 1021]  Wt Readings from Last 3 Encounters:  11/18/23 39.9 kg  10/12/23 41.7 kg   10/04/23 41.7 kg     Unresulted Labs (From admission, onward)     Start     Ordered   11/15/23 1241  Norovirus group 1 & 2 by PCR, stool  (Norovirus group 1 & 2 by PCR, stool panel)  Once,   R        11/15/23 1240          Data Reviewed: I have personally reviewed following labs and imaging studies CBC: Recent Labs  Lab 11/14/23 1250 11/15/23 0120 11/15/23 0400 11/16/23 0631  WBC 9.7 10.2 9.0 7.9  HGB 9.8* 9.3* 9.0* 10.1*  HCT 30.5* 29.6* 28.5* 31.1*  MCV 91.6 92.2 91.1 88.1  PLT 557* 475* 575* 708*   Basic Metabolic Panel:  Recent Labs  Lab 11/14/23 1250 11/15/23 0120 11/15/23 0400 11/16/23 0631 11/16/23 1133 11/17/23 0408 11/17/23 0857 11/18/23 0446  NA 138  --  138 137  --  139 139 138  K 4.0  --  4.4 2.9* 2.8* 3.4* 4.0 3.4*  CL 96*  --  95* 89*  --  96* 97* 97*  CO2 30  --  28 34*  --  30 33* 32  GLUCOSE 122*  --  120* 91  --  140* 133* 130*  BUN 7*  --  8 9  --  8 <5* 6*  CREATININE 0.62   < > 0.72 0.70  --  0.71 0.68 0.80  CALCIUM 8.7*  --  8.5* 8.6*  --  8.4* 8.5* 8.2*  MG 1.9  --   --  1.6*  --  2.3  --  1.8   < > = values in this interval not displayed.   GFR: Estimated Creatinine Clearance: 39.4 mL/min (by C-G formula based on SCr of 0.8 mg/dL). Liver Function Tests:  Recent Labs  Lab 11/15/23 0400  AST 65*  ALT 17  ALKPHOS 61  BILITOT 1.9*  PROT 6.7  ALBUMIN 3.1*   No results for input(s): "HGBA1C" in the last 72 hours. No results for input(s): "GLUCAP" in the last 168 hours. Recent Labs    11/16/23 0631  CHOL 202*  HDL 52  LDLCALC 131*  TRIG 94  CHOLHDL 3.9  Sepsis Labs: No results for input(s): "PROCALCITON", "LATICACIDVEN" in the last 168 hours. Recent Results (from the past 240 hours)  Blood culture (routine x 2)     Status: None (Preliminary result)   Collection Time: 11/14/23  1:23 PM   Specimen: BLOOD RIGHT ARM  Result Value Ref Range Status   Specimen Description BLOOD RIGHT ARM  Final   Special Requests   Final     BOTTLES DRAWN AEROBIC AND ANAEROBIC Blood Culture adequate volume   Culture   Final    NO GROWTH 4 DAYS Performed at [facility], [address], [place], [place] [address]    Report Status PENDING  Incomplete  Blood culture (routine x 2)     Status: None (Preliminary result)   Collection Time: 11/14/23  1:28 PM   Specimen: BLOOD  Result Value Ref Range Status   Specimen Description BLOOD RIGHT ANTECUBITAL  Final   Special Requests   Final    BOTTLES DRAWN AEROBIC AND ANAEROBIC Blood Culture adequate volume   Culture   Final    NO GROWTH 4 DAYS Performed at [facility], [address], [place], [place] [address]    Report Status PENDING  Incomplete    Antimicrobials/Microbiology: Anti-infectives (From admission, onward)    Start     Dose/Rate Route Frequency Ordered Stop   11/16/23 1600  ciprofloxacin (CIPRO) IVPB 400 mg        400 mg 200 mL/hr over 60 Minutes Intravenous Every 12 hours 11/16/23 1558     11/14/23 1330  cefTRIAXone (ROCEPHIN) 2 g in sodium chloride 0.9 % 100 mL IVPB        2 g 200 mL/hr over 30 Minutes Intravenous  Once 11/14/23 1322 11/14/23 1525   11/14/23 1330  azithromycin (ZITHROMAX) 500 mg in sodium chloride 0.9 % 250 mL IVPB        500 mg 250 mL/hr over 60 Minutes Intravenous  Once 11/14/23 1322 11/14/23 1634         Component Value Date/Time   SDES BLOOD RIGHT ANTECUBITAL 11/14/2023 1328   SPECREQUEST  11/14/2023 1328    BOTTLES DRAWN AEROBIC AND ANAEROBIC Blood Culture adequate volume   CULT  11/14/2023 1328  NO GROWTH 4 DAYS Performed at [facility], [address], [place], [place] [address]    REPTSTATUS PENDING 11/14/2023 1328  Radiology Studies: DG CHEST PORT 1 VIEW Result Date: 11/17/2023 CLINICAL DATA:  Heart failure. EXAM: PORTABLE CHEST 1 VIEW COMPARISON:  Chest radiographs 11/14/2023 and 12/22/2022; CT chest 11/14/2023 FINDINGS: Cardiac silhouette and mediastinal contours are within limits. There is a right  basilar heterogeneous airspace opacity similar to prior. Small bilateral pleural effusions obscure the costophrenic angles, similar to prior. No pneumothorax. Mild dextrocurvature of the lower thoracic spine. Surgical clips overlie the lateral aspect of the lateral chest walls and the left axilla, related to bilateral 1 mastectomies and left axillary lymph node the sections seen on prior CT. There lucent cystic emphysematous changes again seen. Mild levocurvature of the upper thoracic spine and mild dextrocurvature of the thoracolumbar junction. ACDF hardware overlies the lower cervical spine. IMPRESSION: 1. Right basilar heterogeneous airspace opacity, similar to 11/14/2023 but new from 12/22/2022. This may represent atelectasis or pneumonia. 2. Small bilateral pleural effusions, also similar to 11/14/2023 but new from 12/22/2022. 3. Chronic emphysematous changes. Electronically Signed   By: Neita Garnet M.D.   On: 11/17/2023 16:54   ECHO TEE Result Date: 11/16/2023    TRANSESOPHOGEAL ECHO REPORT   Patient Name:   ZAKIRAH WEINGART Date of Exam: 11/16/2023 Medical Rec #:  [id number]         Height:       60.0 in Accession #:    [id number]        Weight:       77.6 lb Date of Birth:  01-Aug-1951        BSA:          1.248 m Patient Age:    72 years          BP:           111/66 mmHg Patient Gender: F                 HR:           99 bpm. Exam Location:  Inpatient Procedure: Color Doppler, Cardiac Doppler and Transesophageal Echo (Both            Spectral and Color Flow Doppler were utilized during procedure). Indications:     Cardioversion  History:         Patient has prior history of Echocardiogram examinations, most                  recent 11/15/2023.  Sonographer:     Harriette Bouillon RDCS Referring Phys:  [id number] ZANE ADAMS Diagnosing Phys: Thomasene Ripple DO PROCEDURE: The transesophogeal probe was passed without difficulty through the esophogus of the patient. Sedation performed by different physician. The patient  developed no complications during the procedure.  IMPRESSIONS  1. Left ventricular ejection fraction, by estimation, is 55 to 60%. The left ventricle has normal function.  2. Right ventricular systolic function is normal. The right ventricular size is normal.  3. Left atrial size was mildly dilated. No left atrial/left atrial appendage thrombus was detected.  4. The mitral valve is abnormal. Severe mitral valve regurgitation. No evidence of mitral stenosis.  5. The tricuspid valve is abnormal. Tricuspid valve regurgitation is mild to moderate.  6. The aortic valve is tricuspid. Aortic valve regurgitation is trivial. No aortic stenosis is present.  7. There is borderline dilatation of the ascending aorta, measuring 36 mm. FINDINGS  Left Ventricle: Left  ventricular ejection fraction, by estimation, is 55 to 60%. The left ventricle has normal function. The left ventricular internal cavity size was small. Right Ventricle: The right ventricular size is normal. No increase in right ventricular wall thickness. Right ventricular systolic function is normal. Left Atrium: Left atrial size was mildly dilated. No left atrial/left atrial appendage thrombus was detected. Right Atrium: Right atrial size was not well visualized. Pericardium: Trivial pericardial effusion is present. The pericardial effusion is circumferential. Mitral Valve: The mitral valve is abnormal. Severe mitral valve regurgitation. No evidence of mitral valve stenosis. Tricuspid Valve: The tricuspid valve is abnormal. Tricuspid valve regurgitation is mild to moderate. No evidence of tricuspid stenosis. Aortic Valve: The aortic valve is tricuspid. Aortic valve regurgitation is trivial. No aortic stenosis is present. Pulmonic Valve: The pulmonic valve was not well visualized. Pulmonic valve regurgitation is trivial. No evidence of pulmonic stenosis. Aorta: There is borderline dilatation of the ascending aorta, measuring 36 mm. There is minimal (Grade I) layered  plaque involving the aortic root. Pulmonary Artery: The pulmonary artery is of normal size. IAS/Shunts: No atrial level shunt detected by color flow Doppler.   AORTA Ao Asc diam: 3.60 cm Lavona Mound Tobb DO Electronically signed by Thomasene Ripple DO Signature Date/Time: 11/16/2023/6:05:04 PM    Final    EP STUDY Result Date: 11/16/2023 See surgical note for result.  LOS: 4 days  Total time spent in review of labs and imaging, patient evaluation, formulation of plan, documentation and communication with family:35 minutes Lanae Boast, MD [facility]  11/18/2023, 10:21 AM

## 2023-11-18 NOTE — Telephone Encounter (Signed)
 Marland Kitchen

## 2023-11-18 NOTE — TOC Progression Note (Addendum)
 Transition of Care [facility]) - Progression Note    Patient Details  Name: [name] MRN: [id number] Date of Birth: 1950-11-22  Transition of Care [facility]) CM/SW Contact  Michaela Corner, [place] Phone Number: 11/18/2023, 10:46 AM  Clinical Narrative:  [name] emailed CSW back to ask if patient can go back to [place] and to speak with her mother about this. CSW informed pt that her dtr would like her to go back to [place], patient is agreeable at this time. CSW reached out to [facility] admissions about patient referral sent, awaiting a response.  11:34 AM CSW notified pts sister, Talbert Forest, and emailed/left VM for [employer] (pts dtr) of copay amount for [facility] ($203/day) and that Sheliah Hatch stated they will need 7 days of payment upfront ($1,421).   12:04 PM Heather called CSW back and stated family can pay copays and 7 day upfront payment. CSW notified [facility]. [name] stated she will call [facility] to coordinate payment. CSW to submit for insurance auth once PT note is in.   4:18 PM CSW submitted insurance auth at this time. Auth id [id number].   TOC will continue to follow.    Expected Discharge Plan: Skilled Nursing Facility Barriers to Discharge: Continued Medical Work up  Expected Discharge Plan and Services   Discharge Planning Services: CM Consult   Living arrangements for the past 2 months: Single Family Home                           HH Arranged: RN, Disease Management, PT, OT, Nurse's Aide HH Agency: [facility] Date [facility] Agency Contacted: 11/15/23 Time HH Agency Contacted: 1239 Representative spoke with at [facility] Agency: Clifton Custard   Social Determinants of Health (SDOH) Interventions SDOH Screenings   Food Insecurity: Food Insecurity Present (11/14/2023)  Housing: Low Risk  (11/17/2023)  Recent Concern: Housing - High Risk (11/14/2023)  Transportation Needs: No Transportation Needs (11/17/2023)  Utilities: Not At Risk (11/14/2023)  Alcohol Screen: Low Risk  (11/17/2023)   Depression (PHQ2-9): Medium Risk (09/16/2023)  Financial Resource Strain: Low Risk  (11/17/2023)  Physical Activity: Inactive (05/11/2023)  Social Connections: Socially Isolated (11/14/2023)  Stress: No Stress Concern Present (05/11/2023)  Tobacco Use: Medium Risk (11/14/2023)  Health Literacy: Adequate Health Literacy (05/11/2023)    Readmission Risk Interventions     No data to display

## 2023-11-18 NOTE — Progress Notes (Signed)
   Heart Failure Stewardship Pharmacist Progress Note   PCP: Melida Quitter, PA PCP-Cardiologist: Rollene Rotunda, MD    HPI:  73 y.o. female with PMHx of GAD, osteoporosis, fibromyalgia, IBS, DDD, HLD, COPD, and ILD on 3L of home O2.  She arrived to [facility] ED on 03/16 with complaints of palpitations. She noticed this symptom over the last few days. She also reported SOB and a dry cough. She appeared to be in A-fib with RVR. HR was 150-160s with EMS. Tachycardia persisted despite diltiazem 10 mg twice. CXR on 03/16 showed bilateral lung base opacities (new), suspected combination of atelectasis/pneumonia and small bilateral effusions. No evidence of pulmonary edema. Chest CT on 03/16 showed cardiomegaly with small pericardial effusion. ECHO on 03/17 revealed LVEF of 60-65% (unchanged from 11/2022), no regional wall motion abnormalities, normal PA pressure, moderate pericardial effusion posterior to left ventricular, no evidence of cardiac tamponade, abnormal MV, severe MV regurgitation, and right atrial pressure of 15 mmHg. ECHO TEE on 03/18 revealed LVEF 55-60%, no L/R atrial appendage thrombus, abnormal MV and severe MV regurgitation.  Amiodarone drip now stopped. Transitioned to PO.   Today patient states she feels the same as yesterday. Her breathing is not any better and she has been unable to get out of bed. She also feels very cold (room temp at highest setting). She remains on supplemental oxygen (3 L O2).   Current HF Medications: Diuretic: Furosemide 40 mg IV BID--> 80 mg IV BID (ordered for tonight) Other: Kcl 60 meQ given x 1 today --> 40 mEq BID (starting tonight)   Prior to admission HF Medications: Beta blocker: Metoprolol tartrate 25 mg every day (last dispensed in March for 30 ds)  Pertinent Lab Values: Serum creatinine 0.8(<0.71), BUN 6(< 8), Potassium 3.4 (<3.4), Sodium 138(<139), BNP 341.9, Magnesium 1.8(<2.3), A1c 5.1  Vital Signs: Weight: 87.96 lbs  (admission weight: 91.9 lbs) Blood pressure: 103/63-105/69 (105/69 @0550 ) Heart rate: 72-93 (86 @0728 ) I/O: net +0.37 L yesterday; net -0.38 L since admission  Medication Assistance / Insurance Benefits Check: Does the patient have prescription insurance?  Yes Type of insurance plan: [facility] Medicare  Outpatient Pharmacy:  Prior to admission outpatient pharmacy:  [id number] - , [place] - [address]  Is the patient willing to use [facility] TOC pharmacy at discharge? Yes Is the patient willing to transition their outpatient pharmacy to utilize a [facility] outpatient pharmacy?   No    Assessment: 1. Acute on chronic HFpEF (LVEF 60-65%). NYHA class III symptoms. - Strict I/Os and daily weights. Keep K>4, Mg>2 -Increase to Furosemide 80 mg IV BID. Monitor for tolerance  -Continue to hold metoprolol tartrate for hypotension -Consider MRA when BP is normotensive -Consider initiation of SGLT2i prior to discharge pending pyelonephritis resolution.     Plan: 1) Medication changes recommended at this time: -Consider initiation of SGLT2i  prior to discharge pending pyelonephritis resolution -Consider MRA when BP is normotensive  2) Patient assistance: -Ran test claim for Farxiga 10 mg and the current 30 day co-pay is $47.00. -Ran test claim for Jardiance 10 mg and the current 30 day co-pay is $47.00. -Is interested in grant program to assist with access  3)  Education  - To be completed prior to discharge  Sofie Rower, PharmD [employer] PGY-1

## 2023-11-18 NOTE — Anesthesia Postprocedure Evaluation (Signed)
 Anesthesia Post Note  Patient: [name]  Procedure(s) Performed: TRANSESOPHAGEAL ECHOCARDIOGRAM CARDIOVERSION     Patient location during evaluation: Cath Lab Anesthesia Type: MAC Level of consciousness: awake and alert Pain management: pain level controlled Vital Signs Assessment: post-procedure vital signs reviewed and stable Respiratory status: spontaneous breathing, nonlabored ventilation and respiratory function stable Cardiovascular status: stable and blood pressure returned to baseline Postop Assessment: no apparent nausea or vomiting Anesthetic complications: no   No notable events documented.                Keiley Levey

## 2023-11-18 NOTE — Progress Notes (Signed)
 Physical Therapy Treatment Patient Details Name: [name] MRN: [id number] DOB: 1950-12-07 Today's Date: 11/18/2023   History of Present Illness [name] is a 73 y.o. female admitted 11/14/23 with SOB and found to have atrial tachycardia r/o Afib with RVR vs Multifocal Atrial tachycardia acute on chronic CHF. 3/18 to have TEE and potential cardioversion this day. PHMx: NICM following treatment of breast cancer,  EF of 60-65%, COPD on 3L O2, fibromyalgia, chronic SI joint pain, mitral regurgitaiton, SVT , GERD, HLD, IBS, osteoporosis, recent concussion and bimalleolar ankle fracture s/p ORIF 2/11 and to SNF until 3/13.   PT Comments  Pt was able to progress OOB mobility by transferring to recliner chair via stand pivot with 1 HHA and minA. She takes increased time to perform all functional mobility. Pt continues to be limited by nausea. Will continue to follow acutely and advance appropriately.    If plan is discharge home, recommend the following: A lot of help with walking and/or transfers;A lot of help with bathing/dressing/bathroom;Help with stairs or ramp for entrance;Assist for transportation;Assistance with cooking/housework   Can travel by private vehicle     Yes  Equipment Recommendations  None recommended by PT    Recommendations for Other Services       Precautions / Restrictions Precautions Precautions: Fall Recall of Precautions/Restrictions: Impaired Restrictions Weight Bearing Restrictions Per Provider Order: No     Mobility  Bed Mobility Overal bed mobility: Needs Assistance Bed Mobility: Supine to Sit     Supine to sit: Contact guard, HOB elevated, Used rails, Min assist     General bed mobility comments: Pt sat up on R side of bed with increased time and moderate encouragement. Pt brought BLE off EOB with CGA to facilitate lift and shift. She was able to manage trunk but required minA at bed pad to assist her to scoot fwd til feet supported.     Transfers Overall transfer level: Needs assistance Equipment used: 1 person hand held assist Transfers: Bed to chair/wheelchair/BSC   Stand pivot transfers: Min assist         General transfer comment: Pt stood with minA to power up, VC for sequencing, and transfered to the recliner chair positioned at a diagonal on her R. Good eccentric control with sitting.    Ambulation/Gait               General Gait Details: Pt declined d/t c/o of nausea.   Stairs             Wheelchair Mobility     Tilt Bed    Modified Rankin (Stroke Patients Only)       Balance Overall balance assessment: Needs assistance Sitting-balance support: No upper extremity supported, Feet supported Sitting balance-Leahy Scale: Fair Sitting balance - Comments: Pt sat EOB with supervision.   Standing balance support: During functional activity, Single extremity supported Standing balance-Leahy Scale: Poor Standing balance comment: Pt dependent on PT for support during transfer. Unsteady, but no overt LOB.                            Communication Communication Communication: No apparent difficulties  Cognition Arousal: Alert Behavior During Therapy: Flat affect   PT - Cognitive impairments: No apparent impairments                         Following commands: Intact      Cueing Cueing Techniques:  Verbal cues, Gestural cues, Tactile cues  Exercises      General Comments General comments (skin integrity, edema, etc.): VSS on 3L      Pertinent Vitals/Pain Pain Assessment Pain Assessment: No/denies pain    Home Living                          Prior Function            PT Goals (current goals can now be found in the care plan section) Acute Rehab PT Goals Patient Stated Goal: Feel better Progress towards PT goals: Progressing toward goals    Frequency    Min 2X/week      PT Plan      Co-evaluation              AM-PAC PT  "6 Clicks" Mobility   Outcome Measure  Help needed turning from your back to your side while in a flat bed without using bedrails?: A Little Help needed moving from lying on your back to sitting on the side of a flat bed without using bedrails?: A Little Help needed moving to and from a bed to a chair (including a wheelchair)?: A Little Help needed standing up from a chair using your arms (e.g., wheelchair or bedside chair)?: A Little Help needed to walk in hospital room?: A Lot Help needed climbing 3-5 steps with a railing? : A Lot 6 Click Score: 16    End of Session Equipment Utilized During Treatment: Gait belt;Oxygen Activity Tolerance: Patient limited by fatigue;Other (comment) (Patient limited by nausea) Patient left: in chair;with call bell/phone within reach;with chair alarm set Nurse Communication: Mobility status;Other (comment) (Pt request for anti-nausea meds) PT Visit Diagnosis: History of falling (Z91.81);Other abnormalities of gait and mobility (R26.89);Difficulty in walking, not elsewhere classified (R26.2);Pain     Time: 1255-1318 PT Time Calculation (min) (ACUTE ONLY): 23 min  Charges:    $Therapeutic Activity: 23-37 mins PT General Charges $$ ACUTE PT VISIT: 1 Visit                     Cheri Guppy, PT, DPT Acute Rehabilitation Services Office: [phone] Secure Chat Preferred  Richardson Chiquito 11/18/2023, 3:52 PM

## 2023-11-18 NOTE — Plan of Care (Signed)
  Problem: Education: Goal: Knowledge of General Education information will improve Description: Including pain rating scale, medication(s)/side effects and non-pharmacologic comfort measures Outcome: Progressing   Problem: Health Behavior/Discharge Planning: Goal: Ability to manage health-related needs will improve Outcome: Progressing   Problem: Clinical Measurements: Goal: Ability to maintain clinical measurements within normal limits will improve Outcome: Progressing Goal: Will remain free from infection Outcome: Progressing Goal: Diagnostic test results will improve Outcome: Progressing Goal: Respiratory complications will improve Outcome: Progressing Goal: Cardiovascular complication will be avoided Outcome: Progressing   Problem: Activity: Goal: Risk for activity intolerance will decrease Outcome: Progressing   Problem: Nutrition: Goal: Adequate nutrition will be maintained Outcome: Progressing   Problem: Coping: Goal: Level of anxiety will decrease Outcome: Progressing   Problem: Elimination: Goal: Will not experience complications related to bowel motility Outcome: Progressing Goal: Will not experience complications related to urinary retention Outcome: Progressing   Problem: Pain Managment: Goal: General experience of comfort will improve and/or be controlled Outcome: Progressing   Problem: Safety: Goal: Ability to remain free from injury will improve Outcome: Progressing   Problem: Skin Integrity: Goal: Risk for impaired skin integrity will decrease Outcome: Progressing   Problem: Education: Goal: Ability to demonstrate management of disease process will improve Outcome: Progressing Goal: Ability to verbalize understanding of medication therapies will improve Outcome: Progressing Goal: Individualized Educational Video(s) Outcome: Progressing   Problem: Activity: Goal: Capacity to carry out activities will improve Outcome: Progressing    Problem: Cardiac: Goal: Ability to achieve and maintain adequate cardiopulmonary perfusion will improve Outcome: Progressing   Problem: Education: Goal: Knowledge of disease or condition will improve Outcome: Progressing Goal: Understanding of medication regimen will improve Outcome: Progressing Goal: Individualized Educational Video(s) Outcome: Progressing   Problem: Activity: Goal: Ability to tolerate increased activity will improve Outcome: Progressing   Problem: Cardiac: Goal: Ability to achieve and maintain adequate cardiopulmonary perfusion will improve Outcome: Progressing   Problem: Health Behavior/Discharge Planning: Goal: Ability to safely manage health-related needs after discharge will improve Outcome: Progressing

## 2023-11-18 NOTE — Consult Note (Signed)
 [facility] Liaison Consult Note   11/18/2023  [name] May 23, 1951 [id number]  Referral:  Alerted by [facility] RN of patient's readmission for follow up.  Insurance: [employer]  Primary Care Provider: Earlyne Iba, [place] [facility] at [facility], this provider is listed for the transition of care [TOC] follow up appointments  and community TOC calls.   RN Hospital Liaison screened the patient remotely at [facility].  Patient remains on Enteric Precaution.   The patient was screened for 30 day readmission hospitalization with noted medium risk score for unplanned readmission risk 2 hospital admissions in 6 months.  The patient was assessed for potential [facility] Coordination service needs for post hospital transition for care coordination. Review of patient's electronic medical record reveals patient is still being recommended for a ST SNF  rehab level of care post hospital. Reviewed PT/OT, [facility]patient [facility] notes for disposition needs and barriers for post hospital care needs. Patient will need insurance authorization and in co-pay days.   Plan: [place] [facility] Liaison will continue to follow progress and disposition to asess for post hospital community care coordination/management needs.  Referral request for community care coordination: pending disposition. Continue to follow progression and disposition.   [facility] Community Care, Population Health does not replace or interfere with any arrangements made by the Inpatient Transition of Care team.   For questions contact:   Charlesetta Shanks, RN, BSN, CCM [place]  [facility] Liaison Direct Dial: [phone] or secure chat Email: [email or web address]

## 2023-11-19 DIAGNOSIS — I340 Nonrheumatic mitral (valve) insufficiency: Secondary | ICD-10-CM | POA: Diagnosis not present

## 2023-11-19 DIAGNOSIS — I4719 Other supraventricular tachycardia: Secondary | ICD-10-CM | POA: Diagnosis not present

## 2023-11-19 DIAGNOSIS — E876 Hypokalemia: Secondary | ICD-10-CM | POA: Diagnosis not present

## 2023-11-19 DIAGNOSIS — I509 Heart failure, unspecified: Secondary | ICD-10-CM | POA: Diagnosis not present

## 2023-11-19 DIAGNOSIS — I4892 Unspecified atrial flutter: Secondary | ICD-10-CM | POA: Diagnosis not present

## 2023-11-19 LAB — BASIC METABOLIC PANEL
Anion gap: 10 (ref 5–15)
BUN: 9 mg/dL (ref 8–23)
CO2: 34 mmol/L — ABNORMAL HIGH (ref 22–32)
Calcium: 8.8 mg/dL — ABNORMAL LOW (ref 8.9–10.3)
Chloride: 93 mmol/L — ABNORMAL LOW (ref 98–111)
Creatinine, Ser: 0.9 mg/dL (ref 0.44–1.00)
GFR, Estimated: >60 mL/min (ref >60)
Glucose, Bld: 102 mg/dL — ABNORMAL HIGH (ref 70–99)
Potassium: 3.2 mmol/L — ABNORMAL LOW (ref 3.5–5.1)
Sodium: 137 mmol/L (ref 135–145)

## 2023-11-19 LAB — CULTURE, BLOOD (ROUTINE X 2)
Culture: NO GROWTH
Culture: NO GROWTH
Special Requests: ADEQUATE
Special Requests: ADEQUATE

## 2023-11-19 LAB — MAGNESIUM: Magnesium: 1.8 mg/dL (ref 1.7–2.4)

## 2023-11-19 MED ORDER — FUROSEMIDE 40 MG PO TABS
40.0000 mg | ORAL_TABLET | Freq: Every day | ORAL | Status: DC
  Administered 2023-11-20 – 2023-11-22 (×3): 40 mg via ORAL
  Filled 2023-11-19 (×3): qty 1

## 2023-11-19 MED ORDER — FUROSEMIDE 40 MG PO TABS: 40.0000 mg | ORAL_TABLET | Freq: Every day | ORAL | Status: DC

## 2023-11-19 MED ORDER — POTASSIUM CHLORIDE 10 MEQ/100ML IV SOLN
INTRAVENOUS | Status: AC
  Administered 2023-11-19: 10 meq via INTRAVENOUS
  Filled 2023-11-19: qty 100

## 2023-11-19 MED ORDER — POTASSIUM CHLORIDE 10 MEQ/100ML IV SOLN
10.0000 meq | INTRAVENOUS | Status: AC
  Administered 2023-11-19 (×3): 10 meq via INTRAVENOUS
  Filled 2023-11-19 (×3): qty 100

## 2023-11-19 NOTE — Progress Notes (Signed)
 PROGRESS NOTE [name]  EAV:[id number] DOB: [birth year] DOA: 11/14/2023 PCP: Melida Quitter, PA  Brief Narrative/Hospital Course: 73 year old female with medical history significant for nonischemic cardiomyopathy, mitral regurgitation, SVT, GERD, hyperlipidemia, IBS, recent bimalleolar ankle fracture after fall presented from SNF for evaluation of increased heart rate, shortness of breath for 2 days.  Patient is noted to have atrial tachycardia, started on Cardizem drip admitted to [facility] service for further management. Cardiology consulted, started on amiodarone drip, diuretics for a flutter/A-fib with RVR MAT, acute on chronic HFrEF w/ pericardial effusion .Subsequently noted to be severe atrial functional MR-cardiology recommended structural team evaluation for mitraclip.  Consultation: Cardiology    Subjective: Patient seen and examined this morning Resting comfortably on bedside chair in no complaint Overnight afebrile BP 90s-100 soft, on [place] 3 L Labs potassium still low at 3.2 , mag 1.8 stable renal function    Assessment and Plan: Principal Problem:   Atrial tachycardia (HCC) Active Problems:   Hypokalemia   Mixed hyperlipidemia   COPD mixed type (HCC)   Protein-calorie malnutrition, severe (HCC)   Chronic respiratory failure with hypoxia (HCC)   Closed bimalleolar fracture of right ankle   Paroxysmal atrial flutter/A-fib with RVR  Multifocal Atrial tachycardia:  Hx of SVT, [place] in fall 2024 noted svt  no afib.  Workup with no PE on CTA Cardiology following s/p TEE 3/18.  Cont Cardizem po- bp soft. Amiodarone drip> changed to p.o. amiodarone 3/20 cont Eliquis  Monitoring telemetry  Acute on Chronic HFrEF with recovered EF Severe MR H/o non ischemic cardiomyopathy Left-sided pleural effusion Moderate pericardial effusion: NICM after treatment of breast cancer, chronic HFrEF with recovery with last EF of 60-65%. 3/18-TEE > EF 55-60%, RV systolic function size is  normal severe MR mild TR borderline dilatation of ascending aorta.   Per cardiology evaluating for MitraClip repeat chest x-ray 3/19 small basilar pleural effusion similar to 3/16 right basilar heterogeneous airspace opacity similar to 3/16. Managed with IV Lasix > plan to transition to p.o. soon  Cont to monitor daily I/O,weight, electrolytes and net balance as below.Keep on  salt/fluid restricted diet and monitor in tele. Net IO Since Admission: -1,519.96 mL [11/19/23 0836]  Filed Weights   11/17/23 0505 11/18/23 0550 11/19/23 0459  Weight: 39.6 kg 39.9 kg (P) 38.2 kg    Recent Labs  Lab 11/14/23 1250 11/15/23 0120 11/16/23 0631 11/16/23 1133 11/17/23 0408 11/17/23 0857 11/18/23 0446 11/19/23 0426  BNP 891.7*  --   --   --   --   --  341.9*  --   BUN 7*   < > 9  --  8 <5* 6* 9  CREATININE 0.62   < > 0.70  --  0.71 0.68 0.80 0.90  K 4.0   < > 2.9* 2.8* 3.4* 4.0 3.4* 3.2*  MG 1.9  --  1.6*  --  2.3  --  1.8 1.8   < > = values in this interval not displayed.    Hypokalemia: Hypomagnesemia: Replace potassium and mag    COPD on chronic oxygen Chronic respiratory failure with hypoxia on 3 L [place]: Continue home 02 , home inhalers and PRN nebs.  At baseline currently   Abdominal discomfort, nausea: Pyelonephritis on CT abdomen Urinalysis fairly unremarkable.started ciprofloxacin 400 mg bid. Due to norovirus outbreak in the facility, Norovirus panel is negative.  Of enteric precaution . Continue PPI, antiemetics.   Hyperlipidemia Cont statins.   Osteoporosis: S/p recent repair for bimalleolar ankle fracture after  a fall cont ptot fall precaution, aspiration precaution   Severe malnutrition BMI 15.16 Dietitian eval Encourage oral diet, supplements.  DVT prophylaxis: Eliquis Code Status:   Code Status: Full Code Family Communication: plan of care discussed with patient at bedside. Patient status is: Remains hospitalized because of severity of illness Level of care:  Progressive   Dispo: The patient is from: home alone            Anticipated disposition: Planning for skilled nursing facility once cleared by cardiology. TOC has submitted for insurance auth  Objective: Vitals last 24 hrs: Vitals:   11/18/23 2010 11/19/23 0000 11/19/23 0459 11/19/23 0809  BP: (!) 103/59 105/60 107/60 92/61  Pulse: 83 82 74 81  Resp: 20  18 19   Temp: 98.2 F (36.8 C)  97.9 F (36.6 C) 97.7 F (36.5 C)  TempSrc: Oral  Oral Axillary  SpO2: 99% 100% 100% 98%  Weight:   (P) 38.2 kg   Height:       Weight change:   Physical Examination: General exam: alert awake, oriented weak HEENT:Oral mucosa moist, Ear/Nose WNL grossly Respiratory system: Bilaterally clear BS,no use of accessory muscle Cardiovascular system: S1 & S2 +, No JVD. Gastrointestinal system: Abdomen soft,NT,ND, BS+ Nervous System: Alert, awake, moving all extremities,and following commands. Extremities: LE edema neg,distal peripheral pulses palpable and warm.  Skin: No rashes,no icterus. MSK: Normal muscle bulk,tone, power   Medications reviewed:  Scheduled Meds:  amiodarone  400 mg Oral BID   Followed by   Melene Muller ON 11/25/2023] amiodarone  200 mg Oral Daily   apixaban  5 mg Oral BID   atorvastatin  40 mg Oral Daily   citalopram  20 mg Oral q AM   diltiazem  120 mg Oral Daily   docusate sodium  100 mg Oral Daily   furosemide  80 mg Intravenous BID   magnesium oxide  400 mg Oral Daily   multivitamin with minerals  1 tablet Oral Daily   pantoprazole  40 mg Oral Daily   umeclidinium bromide  1 puff Inhalation Daily   Continuous Infusions:  ciprofloxacin 400 mg (11/19/23 0435)    Diet Order             Diet regular Room service appropriate? Yes with Assist; Fluid consistency: Thin  Diet effective now                  Intake/Output Summary (Last 24 hours) at 11/19/2023 0836 Last data filed at 11/18/2023 2203 Gross per 24 hour  Intake 680 ml  Output 1825 ml  Net -1145 ml   Net IO  Since Admission: -1,519.96 mL [11/19/23 0836]  Wt Readings from Last 3 Encounters:  11/19/23 (P) 38.2 kg  10/12/23 41.7 kg  10/04/23 41.7 kg     Unresulted Labs (From admission, onward)     Start     Ordered   11/19/23 0500  Basic metabolic panel  Daily,   R     Question:  Specimen collection method  Answer:  Lab=Lab collect   11/18/23 1133   11/19/23 0500  Magnesium  Daily,   R     Question:  Specimen collection method  Answer:  Lab=Lab collect   11/18/23 1133          Data Reviewed: I have personally reviewed following labs and imaging studies CBC: Recent Labs  Lab 11/14/23 1250 11/15/23 0120 11/15/23 0400 11/16/23 0631  WBC 9.7 10.2 9.0 7.9  HGB 9.8* 9.3* 9.0* 10.1*  HCT 30.5* 29.6* 28.5* 31.1*  MCV 91.6 92.2 91.1 88.1  PLT 557* 475* 575* 708*   Basic Metabolic Panel:  Recent Labs  Lab 11/14/23 1250 11/15/23 0120 11/16/23 0631 11/16/23 1133 11/17/23 0408 11/17/23 0857 11/18/23 0446 11/19/23 0426  NA 138   < > 137  --  139 139 138 137  K 4.0   < > 2.9* 2.8* 3.4* 4.0 3.4* 3.2*  CL 96*   < > 89*  --  96* 97* 97* 93*  CO2 30   < > 34*  --  30 33* 32 34*  GLUCOSE 122*   < > 91  --  140* 133* 130* 102*  BUN 7*   < > 9  --  8 <5* 6* 9  CREATININE 0.62   < > 0.70  --  0.71 0.68 0.80 0.90  CALCIUM 8.7*   < > 8.6*  --  8.4* 8.5* 8.2* 8.8*  MG 1.9  --  1.6*  --  2.3  --  1.8 1.8   < > = values in this interval not displayed.   GFR: Estimated Creatinine Clearance: 35.1 mL/min (by C-G formula based on SCr of 0.9 mg/dL). Liver Function Tests:  Recent Labs  Lab 11/15/23 0400  AST 65*  ALT 17  ALKPHOS 61  BILITOT 1.9*  PROT 6.7  ALBUMIN 3.1*   No results for input(s): "HGBA1C" in the last 72 hours. No results for input(s): "GLUCAP" in the last 168 hours. No results for input(s): "CHOL", "HDL", "LDLCALC", "TRIG", "CHOLHDL", "LDLDIRECT" in the last 72 hours. Sepsis Labs: No results for input(s): "PROCALCITON", "LATICACIDVEN" in the last 168 hours. Recent  Results (from the past 240 hours)  Blood culture (routine x 2)     Status: None   Collection Time: 11/14/23  1:23 PM   Specimen: BLOOD RIGHT ARM  Result Value Ref Range Status   Specimen Description BLOOD RIGHT ARM  Final   Special Requests   Final    BOTTLES DRAWN AEROBIC AND ANAEROBIC Blood Culture adequate volume   Culture   Final    NO GROWTH 5 DAYS Performed at [facility], [address] [place], [place] [address]    Report Status 11/19/2023 FINAL  Final  Blood culture (routine x 2)     Status: None   Collection Time: 11/14/23  1:28 PM   Specimen: BLOOD  Result Value Ref Range Status   Specimen Description BLOOD RIGHT ANTECUBITAL  Final   Special Requests   Final    BOTTLES DRAWN AEROBIC AND ANAEROBIC Blood Culture adequate volume   Culture   Final    NO GROWTH 5 DAYS Performed at [facility], [address] [place], [place] [address]    Report Status 11/19/2023 FINAL  Final    Antimicrobials/Microbiology: Anti-infectives (From admission, onward)    Start     Dose/Rate Route Frequency Ordered Stop   11/16/23 1600  ciprofloxacin (CIPRO) IVPB 400 mg        400 mg 200 mL/hr over 60 Minutes Intravenous Every 12 hours 11/16/23 1558     11/14/23 1330  cefTRIAXone (ROCEPHIN) 2 g in sodium chloride 0.9 % 100 mL IVPB        2 g 200 mL/hr over 30 Minutes Intravenous  Once 11/14/23 1322 11/14/23 1525   11/14/23 1330  azithromycin (ZITHROMAX) 500 mg in sodium chloride 0.9 % 250 mL IVPB        500 mg 250 mL/hr over 60  Minutes Intravenous  Once 11/14/23 1322 11/14/23 1634         Component Value Date/Time   SDES BLOOD RIGHT ANTECUBITAL 11/14/2023 1328   SPECREQUEST  11/14/2023 1328    BOTTLES DRAWN AEROBIC AND ANAEROBIC Blood Culture adequate volume   CULT  11/14/2023 1328    NO GROWTH 5 DAYS Performed at [facility], [address] [place], [place] [address]    REPTSTATUS 11/19/2023 FINAL 11/14/2023 1328  Radiology Studies: DG CHEST PORT 1  VIEW Result Date: 11/17/2023 CLINICAL DATA:  Heart failure. EXAM: PORTABLE CHEST 1 VIEW COMPARISON:  Chest radiographs 11/14/2023 and 12/22/2022; CT chest 11/14/2023 FINDINGS: Cardiac silhouette and mediastinal contours are within limits. There is a right basilar heterogeneous airspace opacity similar to prior. Small bilateral pleural effusions obscure the costophrenic angles, similar to prior. No pneumothorax. Mild dextrocurvature of the lower thoracic spine. Surgical clips overlie the lateral aspect of the lateral chest walls and the left axilla, related to bilateral 1 mastectomies and left axillary lymph node the sections seen on prior CT. There lucent cystic emphysematous changes again seen. Mild levocurvature of the upper thoracic spine and mild dextrocurvature of the thoracolumbar junction. ACDF hardware overlies the lower cervical spine. IMPRESSION: 1. Right basilar heterogeneous airspace opacity, similar to 11/14/2023 but new from 12/22/2022. This may represent atelectasis or pneumonia. 2. Small bilateral pleural effusions, also similar to 11/14/2023 but new from 12/22/2022. 3. Chronic emphysematous changes. Electronically Signed   By: Neita Garnet M.D.   On: 11/17/2023 16:54   LOS: 5 days  Total time spent in review of labs and imaging, patient evaluation, formulation of plan, documentation and communication with family:35 minutes Lanae Boast, MD [facility]  11/19/2023, 8:36 AM

## 2023-11-19 NOTE — Progress Notes (Addendum)
 Rounding Note    Patient Name: [name] Date of Encounter: 11/19/2023  [facility] Cardiologist: Rollene Rotunda, MD   Subjective   Has been on 24/7 3L home O2 since last April.   Inpatient Medications    Scheduled Meds:  amiodarone  400 mg Oral BID   Followed by   Melene Muller ON 11/25/2023] amiodarone  200 mg Oral Daily   apixaban  5 mg Oral BID   atorvastatin  40 mg Oral Daily   citalopram  20 mg Oral q AM   diltiazem  120 mg Oral Daily   docusate sodium  100 mg Oral Daily   furosemide  80 mg Intravenous BID   magnesium oxide  400 mg Oral Daily   multivitamin with minerals  1 tablet Oral Daily   pantoprazole  40 mg Oral Daily   umeclidinium bromide  1 puff Inhalation Daily   Continuous Infusions:  ciprofloxacin 400 mg (11/19/23 0435)   potassium chloride     PRN Meds: acetaminophen **OR** acetaminophen, acetaminophen, hydrOXYzine, levalbuterol, ondansetron (ZOFRAN) IV, oxyCODONE, promethazine   Vital Signs    Vitals:   11/18/23 2010 11/19/23 0000 11/19/23 0459 11/19/23 0809  BP: (!) 103/59 105/60 107/60 92/61  Pulse: 83 82 74 81  Resp: 20  18 19   Temp: 98.2 F (36.8 C)  97.9 F (36.6 C) 97.7 F (36.5 C)  TempSrc: Oral  Oral Axillary  SpO2: 99% 100% 100% 98%  Weight:   (P) 38.2 kg   Height:        Intake/Output Summary (Last 24 hours) at 11/19/2023 0909 Last data filed at 11/18/2023 2203 Gross per 24 hour  Intake 680 ml  Output 1825 ml  Net -1145 ml      11/19/2023    4:59 AM 11/18/2023    5:50 AM 11/17/2023    5:05 AM  Last 3 Weights  Weight (lbs) 84 lb 3.5 oz 87 lb 15.4 oz 87 lb 4.8 oz  Weight (kg) 38.2 kg 39.9 kg 39.599 kg      Telemetry    NSR with PACs, no significant ventricular ectopy or recurrent afib - Personally Reviewed  ECG    NSR, suspect T wave inversion in lateral leads the result of motion artifact  - Personally Reviewed  Physical Exam   GEN: No acute distress.   Neck: No JVD Cardiac: RRR, no murmurs, rubs,  or gallops.  Respiratory: Clear to auscultation bilaterally. GI: Soft, nontender, non-distended  MS: No edema; No deformity. Neuro:  Nonfocal  Psych: Normal affect   Labs    High Sensitivity Troponin:  No results for input(s): "TROPONINIHS" in the last 720 hours.   Chemistry Recent Labs  Lab 11/15/23 0400 11/16/23 0631 11/17/23 0408 11/17/23 0857 11/18/23 0446 11/19/23 0426  NA 138   < > 139 139 138 137  K 4.4   < > 3.4* 4.0 3.4* 3.2*  CL 95*   < > 96* 97* 97* 93*  CO2 28   < > 30 33* 32 34*  GLUCOSE 120*   < > 140* 133* 130* 102*  BUN 8   < > 8 <5* 6* 9  CREATININE 0.72   < > 0.71 0.68 0.80 0.90  CALCIUM 8.5*   < > 8.4* 8.5* 8.2* 8.8*  MG  --    < > 2.3  --  1.8 1.8  PROT 6.7  --   --   --   --   --   ALBUMIN 3.1*  --   --   --   --   --  AST 65*  --   --   --   --   --   ALT 17  --   --   --   --   --   ALKPHOS 61  --   --   --   --   --   BILITOT 1.9*  --   --   --   --   --   GFRNONAA >60   < > >60 >60 >60 >60  ANIONGAP 15   < > 13 9 9 10    < > = values in this interval not displayed.    Lipids  Recent Labs  Lab 11/16/23 0631  CHOL 202*  TRIG 94  HDL 52  LDLCALC 131*  CHOLHDL 3.9    Hematology Recent Labs  Lab 11/15/23 0120 11/15/23 0400 11/16/23 0631  WBC 10.2 9.0 7.9  RBC 3.21* 3.13* 3.53*  HGB 9.3* 9.0* 10.1*  HCT 29.6* 28.5* 31.1*  MCV 92.2 91.1 88.1  MCH 29.0 28.8 28.6  MCHC 31.4 31.6 32.5  RDW 14.0 13.8 13.8  PLT 475* 575* 708*   Thyroid  Recent Labs  Lab 11/14/23 1250  TSH 2.991    BNP Recent Labs  Lab 11/14/23 1250 11/18/23 0446  BNP 891.7* 341.9*    DDimer  Recent Labs  Lab 11/14/23 1250  DDIMER 4.03*     Radiology    DG CHEST PORT 1 VIEW Result Date: 11/17/2023 CLINICAL DATA:  Heart failure. EXAM: PORTABLE CHEST 1 VIEW COMPARISON:  Chest radiographs 11/14/2023 and 12/22/2022; CT chest 11/14/2023 FINDINGS: Cardiac silhouette and mediastinal contours are within limits. There is a right basilar heterogeneous airspace  opacity similar to prior. Small bilateral pleural effusions obscure the costophrenic angles, similar to prior. No pneumothorax. Mild dextrocurvature of the lower thoracic spine. Surgical clips overlie the lateral aspect of the lateral chest walls and the left axilla, related to bilateral 1 mastectomies and left axillary lymph node the sections seen on prior CT. There lucent cystic emphysematous changes again seen. Mild levocurvature of the upper thoracic spine and mild dextrocurvature of the thoracolumbar junction. ACDF hardware overlies the lower cervical spine. IMPRESSION: 1. Right basilar heterogeneous airspace opacity, similar to 11/14/2023 but new from 12/22/2022. This may represent atelectasis or pneumonia. 2. Small bilateral pleural effusions, also similar to 11/14/2023 but new from 12/22/2022. 3. Chronic emphysematous changes. Electronically Signed   By: Neita Garnet M.D.   On: 11/17/2023 16:54    Cardiac Studies   Echo 11/15/2023 1. Left ventricular ejection fraction, by estimation, is 60 to 65%. The  left ventricle has normal function. The left ventricle has no regional  wall motion abnormalities. Left ventricular diastolic function could not  be evaluated.   2. Right ventricular systolic function is normal. The right ventricular  size is normal. There is normal pulmonary artery systolic pressure. The  estimated right ventricular systolic pressure is 35.8 mmHg.   3. Left atrial size was severely dilated.   4. Right atrial size was severely dilated.   5. Moderate pericardial effusion. The pericardial effusion is posterior  to the left ventricle. There is no evidence of cardiac tamponade.   6. The mitral valve is abnormal. Severe mitral valve regurgitation. No  evidence of mitral stenosis.   7. The aortic valve is tricuspid. There is mild calcification of the  aortic valve. Aortic valve regurgitation is not visualized. Aortic valve  sclerosis/calcification is present, without any  evidence of aortic  stenosis.   8. Aortic dilatation noted.  There is mild dilatation of the ascending  aorta, measuring 41 mm.   9. The inferior vena cava is dilated in size with <50% respiratory  variability, suggesting right atrial pressure of 15 mmHg.       TEE 11/16/2023 1. Left ventricular ejection fraction, by estimation, is 55 to 60%. The  left ventricle has normal function.   2. Right ventricular systolic function is normal. The right ventricular  size is normal.   3. Left atrial size was mildly dilated. No left atrial/left atrial  appendage thrombus was detected.   4. The mitral valve is abnormal. Severe mitral valve regurgitation. No  evidence of mitral stenosis.   5. The tricuspid valve is abnormal. Tricuspid valve regurgitation is mild  to moderate.   6. The aortic valve is tricuspid. Aortic valve regurgitation is trivial.  No aortic stenosis is present.   7. There is borderline dilatation of the ascending aorta, measuring 36  mm.   Patient Profile     72 y.o. female with PMH of NICM in 2010 after breast CA treatment with improved EF, PAF, SVT, HLD, COPD, fibromyalgia, GERD and breast CA who presented with 2 days of SOB and found to be in afib with RVR.   Assessment & Plan    Acute diastolic heart failure with severe MR             - interstitial pulmonary edema with mild pleural effusion             - TTE 11/15/2023 EF 60 to 65%, no regional wall motion abnormality, RVSP 35.8 mmHg, severe biatrial enlargement, moderate pericardial effusion, severe MR, mildly dilated ascending aorta 41 mm             -TEE 11/16/2023 EF 55 to 60%, normal RV, severe MR, mild to moderate TR, trivial AI             -Plan for structural heart team evaluation as outpatient for MitraClip.             -I/O not accurate. Patient does not appears to be significantly volume overload, diminished breath sound in the bases of lung.   - IV lasix increased yesterday, weak and nauseated today, lab work  stable, will discuss with MD to stop the IV lasix. I suspect she is euvolemic. Recommend switch to 40mg  oral lasix   PAF -Although questionable A-fib during previous hospitalization in 2024, however 2 subsequent heart monitor showed no evidence of atrial fibrillation. -She arrived at this time with A-fib with RVR.  Started on Eliquis, diltiazem and amiodarone.  TSH normal.  Underwent TEE DCCV 11/16/2023. -Transitioned amiodarone to oral on 3/20, 400 mg twice daily for 1 week then 200 mg daily thereafter.   Coronary artery calcification: Denies any chest pain   COPD: On chronic 3 L oxygen at home 24/7 since April 2024   Hyperlipidemia   Elevated d-dimer: CTA negative for PE      For questions or updates, please contact [facility] Please consult [email or web address] for contact info under        Signed, Azalee Course, PA  11/19/2023, 9:09 AM     I have personally seen and examined the patient.  My HPI, Exam, and assessment and plan are below, independent of the NPP above.  Patient notes feeling worse today.  Slight improvement in SOB.  Worsening weaness and nausea  Exam notable for  Gen: no distress, frail and thin  Cardiac: No Rubs or Gallops, systolic Murmur,  RRR Respiratory: Bilateral wheezes GI: Soft, nontender, non-distended  MS: No  edema;  moves all extremities Integument: Skin feels warm Neuro:  At time of evaluation, alert and oriented to person/place/time/situation  Psych: Normal affect, patient feels ok  Tele: SR  In assessment and plan:    Severe, atrial function mitral regurgitation: candidate for mTEER anatomically; cautiously optimistic about her clinical frailty after talking with her daughter; encouraged her to come to 12/03/23 visit  Mixed picture acute on chronic hypoxic respiratory failure with Pleural effusion- Transition to PO diuretic today  Atelectasis- IS; she is using  Mild aortic dilation- will get imaging f/u for the above  PAF- tolerated AC,  AAD PO with no return; 200 mg PO Daily at DC  CAC- outpatient managmend with HLD prevention  COPD- as per primary, on 3 L    Riley Lam, MD FASE [facility] Cardiologist [facility]  [address] [place], [address] [place], [place] [address] [phone]  1:57 PM

## 2023-11-19 NOTE — Care Management Important Message (Signed)
 Important Message  Patient Details  Name: [name] MRN: [id number] Date of Birth: July 15, 1951   Important Message Given:  Yes - Medicare IM     Renie Ora 11/19/2023, 10:37 AM

## 2023-11-19 NOTE — Progress Notes (Signed)
 Mobility Specialist Progress Note;   11/19/23 0910  Mobility  Activity Transferred from bed to chair  Level of Assistance Minimal assist, patient does 75% or more  Assistive Device Other (Comment) (HHA)  Distance Ambulated (ft) 3 ft  Activity Response Tolerated well  Mobility Referral Yes  Mobility visit 1 Mobility  Mobility Specialist Start Time (ACUTE ONLY) 0910  Mobility Specialist Stop Time (ACUTE ONLY) [id number]  Mobility Specialist Time Calculation (min) (ACUTE ONLY) 11 min   Pt agreeable to mobility. Required MinA for bed mobility and to safely transfer pt from bed to chair via HHA. VSS on 3LO2. No c/o when asked. Pt left in chair with all needs met, alarm on.   Caesar Bookman Mobility Specialist Please contact via SecureChat or [employer] [phone]

## 2023-11-19 NOTE — Plan of Care (Signed)
  Problem: Education: Goal: Knowledge of General Education information will improve Description: Including pain rating scale, medication(s)/side effects and non-pharmacologic comfort measures Outcome: Progressing   Problem: Health Behavior/Discharge Planning: Goal: Ability to manage health-related needs will improve Outcome: Progressing   Problem: Clinical Measurements: Goal: Ability to maintain clinical measurements within normal limits will improve Outcome: Progressing Goal: Will remain free from infection Outcome: Progressing Goal: Diagnostic test results will improve Outcome: Progressing Goal: Respiratory complications will improve Outcome: Progressing Goal: Cardiovascular complication will be avoided Outcome: Progressing   Problem: Activity: Goal: Risk for activity intolerance will decrease Outcome: Progressing   Problem: Nutrition: Goal: Adequate nutrition will be maintained Outcome: Progressing   Problem: Coping: Goal: Level of anxiety will decrease Outcome: Progressing   Problem: Elimination: Goal: Will not experience complications related to bowel motility Outcome: Progressing Goal: Will not experience complications related to urinary retention Outcome: Progressing   Problem: Pain Managment: Goal: General experience of comfort will improve and/or be controlled Outcome: Progressing   Problem: Safety: Goal: Ability to remain free from injury will improve Outcome: Progressing   Problem: Skin Integrity: Goal: Risk for impaired skin integrity will decrease Outcome: Progressing   Problem: Education: Goal: Ability to demonstrate management of disease process will improve Outcome: Progressing Goal: Ability to verbalize understanding of medication therapies will improve Outcome: Progressing Goal: Individualized Educational Video(s) Outcome: Progressing   Problem: Activity: Goal: Capacity to carry out activities will improve Outcome: Progressing    Problem: Cardiac: Goal: Ability to achieve and maintain adequate cardiopulmonary perfusion will improve Outcome: Progressing   Problem: Education: Goal: Knowledge of disease or condition will improve Outcome: Progressing Goal: Understanding of medication regimen will improve Outcome: Progressing Goal: Individualized Educational Video(s) Outcome: Progressing   Problem: Activity: Goal: Ability to tolerate increased activity will improve Outcome: Progressing   Problem: Cardiac: Goal: Ability to achieve and maintain adequate cardiopulmonary perfusion will improve Outcome: Progressing   Problem: Health Behavior/Discharge Planning: Goal: Ability to safely manage health-related needs after discharge will improve Outcome: Progressing

## 2023-11-19 NOTE — Progress Notes (Signed)
   Heart Failure Stewardship Pharmacist Progress Note   PCP: Melida Quitter, PA PCP-Cardiologist: Rollene Rotunda, MD    HPI:  73 y.o. female with PMH of GAD, osteoporosis, fibromyalgia, IBS, DDD, HLD, COPD, and ILD on 3L of home O2.  She arrived to [facility] ED on 03/16 with complaints of palpitations. She noticed this symptom over the last few days. She also reported SOB and a dry cough. She appeared to be in A-fib with RVR. HR was 150-160s with EMS. Tachycardia persisted despite diltiazem 10 mg twice. CXR on 03/16 showed bilateral lung base opacities (new), suspected combination of atelectasis/pneumonia and small bilateral effusions. No evidence of pulmonary edema. Chest CT on 03/16 showed cardiomegaly with small pericardial effusion. ECHO on 03/17 revealed LVEF of 60-65% (unchanged from 11/2022), no regional wall motion abnormalities, normal PA pressure, moderate pericardial effusion posterior to left ventricular, no evidence of cardiac tamponade, abnormal MV, severe MV regurgitation, and right atrial pressure of 15 mmHg. TEE/DCCV on 03/18 revealed LVEF 55-60%, no L/R atrial appendage thrombus, abnormal MV and severe MV regurgitation. Converted to NSR. Went back into afib the next day for a few hours. Amiodarone drip now stopped. Transitioned to PO. Structural team following for possible mitraclip.  Patient denies any shortness of breath. Has been up in the chair today for about 45 mins. No difficulty with shortness of breath on transfers. No LE edema. She is on 3L [place] which is what she was on prior to admission.  Current HF Medications: Diuretic: Furosemide 80 mg IV BID Other: Kcl 10 mEq IV x 4   Prior to admission HF Medications: Beta blocker: Metoprolol tartrate 25 mg every day (last dispensed in March for 30 ds)  Pertinent Lab Values: Serum creatinine 0.90, BUN 9, Potassium 3.2, Sodium 137, BNP 341.9, Magnesium 1.8, A1c 5.1  Vital Signs: Weight: 84 lbs (admission  weight: 91.9 lbs) Blood pressure: 100/60s Heart rate: 70-80s I/O: net -1.8 L yesterday; net -1.5 L since admission  Medication Assistance / Insurance Benefits Check: Does the patient have prescription insurance?  Yes Type of insurance plan: [facility] Medicare  Outpatient Pharmacy:  Prior to admission outpatient pharmacy:  [id number] - [place], [place] - [address]  Is the patient willing to use [facility] TOC pharmacy at discharge? Yes Is the patient willing to transition their outpatient pharmacy to utilize a [facility] outpatient pharmacy?   No    Assessment: 1. Acute on chronic HFpEF (LVEF 60-65%). NYHA class II symptoms. - Strict I/Os and daily weights. Keep K>4, Mg>2 -On furosemide 80 mg IV BID - may be able to transition to PO lasix - recommend 40 mg daily. -Continue to hold metoprolol tartrate for low BP -Consider MRA when BP is normotensive -Consider initiation of SGLT2i prior to discharge pending pyelonephritis resolution.     Plan: 1) Medication changes recommended at this time:  - Transition IV lasix to PO 40 mg daily  2) Patient assistance: -Ran test claim for Farxiga 10 mg and the current 30 day co-pay is $47.00. -Ran test claim for Jardiance 10 mg and the current 30 day co-pay is $47.00. -Is interested in grant program to assist with access if these medications are added  3)  Education  Sharen Hones, PharmD, BCPS Heart Failure Stewardship Pharmacist Phone [phone]   Sharen Hones, PharmD, BCPS Heart Failure Stewardship Pharmacist Phone [phone]

## 2023-11-19 NOTE — Plan of Care (Signed)

## 2023-11-19 NOTE — TOC Progression Note (Signed)
 Transition of Care [facility]) - Progression Note    Patient Details  Name: [name] MRN: [id number] Date of Birth: 15-Apr-1951  [facility]) CM/SW Contact  Delilah Shan, LCSWA Phone Number: 11/19/2023, 3:26 PM  Clinical Narrative:     Insurance authorization pending for [facility]. CSW will continue to follow.  Expected Discharge Plan: Skilled Nursing Facility Barriers to Discharge: Continued Medical Work up  Expected Discharge Plan and Services   Discharge Planning Services: CM Consult   Living arrangements for the past 2 months: Single Family Home                           HH Arranged: RN, Disease Management, PT, OT, Nurse's Aide HH Agency: [facility] Date [facility] Agency Contacted: 11/15/23 Time HH Agency Contacted: [phone] Representative spoke with at [facility] Agency: Clifton Custard   Social Determinants of Health (SDOH) Interventions SDOH Screenings   Food Insecurity: Food Insecurity Present (11/14/2023)  Housing: Low Risk  (11/17/2023)  Recent Concern: Housing - High Risk (11/14/2023)  Transportation Needs: No Transportation Needs (11/17/2023)  Utilities: Not At Risk (11/14/2023)  Alcohol Screen: Low Risk  (11/17/2023)  Depression (PHQ2-9): Medium Risk (09/16/2023)  Financial Resource Strain: Low Risk  (11/17/2023)  Physical Activity: Inactive (05/11/2023)  Social Connections: Socially Isolated (11/14/2023)  Stress: No Stress Concern Present (05/11/2023)  Tobacco Use: Medium Risk (11/14/2023)  Health Literacy: Adequate Health Literacy (05/11/2023)    Readmission Risk Interventions     No data to display

## 2023-11-20 DIAGNOSIS — I4719 Other supraventricular tachycardia: Secondary | ICD-10-CM | POA: Diagnosis not present

## 2023-11-20 LAB — BASIC METABOLIC PANEL
Anion gap: 13 (ref 5–15)
BUN: 14 mg/dL (ref 8–23)
CO2: 31 mmol/L (ref 22–32)
Calcium: 9.4 mg/dL (ref 8.9–10.3)
Chloride: 94 mmol/L — ABNORMAL LOW (ref 98–111)
Creatinine, Ser: 0.89 mg/dL (ref 0.44–1.00)
GFR, Estimated: >60 mL/min (ref >60)
Glucose, Bld: 107 mg/dL — ABNORMAL HIGH (ref 70–99)
Potassium: 3.9 mmol/L (ref 3.5–5.1)
Sodium: 138 mmol/L (ref 135–145)

## 2023-11-20 LAB — MAGNESIUM: Magnesium: 1.8 mg/dL (ref 1.7–2.4)

## 2023-11-20 MED ORDER — CIPROFLOXACIN HCL 500 MG PO TABS: 500.0000 mg | ORAL_TABLET | Freq: Two times a day (BID) | ORAL | Status: AC

## 2023-11-20 MED ORDER — OXYCODONE HCL 5 MG PO TABS: 5.0000 mg | ORAL_TABLET | ORAL | 0 refills | Status: AC | PRN

## 2023-11-20 MED ORDER — APIXABAN 5 MG PO TABS: 5.0000 mg | ORAL_TABLET | Freq: Two times a day (BID) | ORAL | Status: AC

## 2023-11-20 MED ORDER — CITALOPRAM HYDROBROMIDE 20 MG PO TABS: 20.0000 mg | ORAL_TABLET | Freq: Every morning | ORAL | 0 refills | Status: DC

## 2023-11-20 MED ORDER — AMIODARONE HCL 200 MG PO TABS: ORAL_TABLET | ORAL | Status: DC

## 2023-11-20 MED ORDER — ATORVASTATIN CALCIUM 40 MG PO TABS: 40.0000 mg | ORAL_TABLET | Freq: Every day | ORAL | Status: AC

## 2023-11-20 MED ORDER — CITALOPRAM HYDROBROMIDE 20 MG PO TABS: 20.0000 mg | ORAL_TABLET | Freq: Every morning | ORAL | Status: AC

## 2023-11-20 MED ORDER — FUROSEMIDE 40 MG PO TABS: 40.0000 mg | ORAL_TABLET | Freq: Every day | ORAL | Status: DC

## 2023-11-20 NOTE — TOC Progression Note (Signed)
 Transition of Care [facility]) - Progression Note    Patient Details  Name: [name] MRN: [id number] Date of Birth: [birth year]  Transition of Care [facility]) CM/SW Contact  Patrice Paradise, LCSW Phone Number: 11/20/2023, 4:07 PM  Clinical Narrative:     CSW spoke with a [employer] representative to check the status again of pt's auth. CSW was informed that pt's Berkley Harvey has  been sent to their medical director for review and it is still is pending.  TOC team will continue to assist with discharge planning needs.   Expected Discharge Plan: Skilled Nursing Facility Barriers to Discharge: Continued Medical Work up  Expected Discharge Plan and Services   Discharge Planning Services: CM Consult   Living arrangements for the past 2 months: Single Family Home Expected Discharge Date: 11/20/23                         HH Arranged: RN, Disease Management, PT, OT, Nurse's Aide HH Agency: [facility] Date [facility] Agency Contacted: 11/15/23 Time HH Agency Contacted: [phone] Representative spoke with at [facility] Agency: Clifton Custard   Social Determinants of Health (SDOH) Interventions SDOH Screenings   Food Insecurity: Food Insecurity Present (11/14/2023)  Housing: Low Risk  (11/17/2023)  Recent Concern: Housing - High Risk (11/14/2023)  Transportation Needs: No Transportation Needs (11/17/2023)  Utilities: Not At Risk (11/14/2023)  Alcohol Screen: Low Risk  (11/17/2023)  Depression (PHQ2-9): Medium Risk (09/16/2023)  Financial Resource Strain: Low Risk  (11/17/2023)  Physical Activity: Inactive (05/11/2023)  Social Connections: Socially Isolated (11/14/2023)  Stress: No Stress Concern Present (05/11/2023)  Tobacco Use: Medium Risk (11/14/2023)  Health Literacy: Adequate Health Literacy (05/11/2023)    Readmission Risk Interventions     No data to display

## 2023-11-20 NOTE — Progress Notes (Signed)
   Patient Name: [name] Date of Encounter: 11/20/2023 [facility] Cardiologist: Rollene Rotunda, MD   Interval Summary  .    Feeling better. Less SOB. On [place] O2.  Vital Signs .    Vitals:   11/19/23 1205 11/19/23 1433 11/19/23 1930 11/20/23 0320  BP: 105/71 101/81 (!) 92/58 107/64  Pulse: 85 84 76 74  Resp: (!) 21  20 16   Temp: 98.2 F (36.8 C) 98.2 F (36.8 C) 98.9 F (37.2 C) 98.5 F (36.9 C)  TempSrc: Oral Axillary Oral Oral  SpO2: 100%  99% 100%  Weight:    39.6 kg  Height:        Intake/Output Summary (Last 24 hours) at 11/20/2023 1013 Last data filed at 11/20/2023 0800 Gross per 24 hour  Intake 1045.2 ml  Output 1320 ml  Net -274.8 ml      11/20/2023    3:20 AM 11/19/2023    4:59 AM 11/18/2023    5:50 AM  Last 3 Weights  Weight (lbs) 87 lb 4.8 oz 84 lb 3.5 oz 87 lb 15.4 oz  Weight (kg) 39.6 kg 38.2 kg 39.9 kg      Telemetry/ECG    nsr - Personally Reviewed  Echo EF 65% moderate size pericardial effusion with no tamponade aorta 41 mm  BMP 341 LDL 131 sodium 138 creatinine 0.89 potassium 3.9  Physical Exam .   GEN: No acute distress.  Thin Neck: No JVD Cardiac: RRR, 2/6 HSM, no rubs, or gallops.  Respiratory: Clear to auscultation bilaterally. Bases are blunted GI: Soft, nontender, non-distended  MS: No edema  Assessment & Plan .     73 year old with severe atrial function mitral regurgitation with mixed picture acute on chronic hypoxic respiratory failure with pleural effusion and atelectasis with mild aortic dilatation paroxysmal atrial fibrillation coronary calcium and COPD on 3 L  Mitral regurgitation - Candidate for mTEER, will discuss further at /4/25 visit.  Does have clinical frailty.  Continues to diurese with p.o. Lasix.  1L out yesterday  Hypoxic respiratory failure - Transition to p.o. diuretic yesterday.  Tolerating.  Atelectasis - Using incentive spirometry  Mild aortic dilatation - Continue with follow-up  imaging as outpatient 41mm  Paroxysmal atrial fibrillation - Transition to oral amiodarone.  TEE cardioversion on 11/16/2023 successful. -Also on diltiazem CD1 120 mg a day  Chronic anticoagulation - Currently on Eliquis 5 mg twice a day  Hyperlipidemia - On atorvastatin 40 mg a day  OK for DC from our standpoint. Will sign off. Has follow up.   For questions or updates, please contact  [facility] Please consult [email or web address] for contact info under        Signed, Donato Schultz, MD

## 2023-11-20 NOTE — Plan of Care (Signed)

## 2023-11-20 NOTE — TOC Progression Note (Signed)
 Transition of Care [facility]) - Progression Note    Patient Details  Name: [name] MRN: [id number] Date of Birth: [birth year]  Transition of Care [facility]) CM/SW Contact  Patrice Paradise, LCSW Phone Number: 11/20/2023, 11:37 AM  Clinical Narrative:     Patient's authorization is still pending.  TOC team will continue to assist with discharge planning needs.   Expected Discharge Plan: Skilled Nursing Facility Barriers to Discharge: Continued Medical Work up  Expected Discharge Plan and Services   Discharge Planning Services: CM Consult   Living arrangements for the past 2 months: Single Family Home Expected Discharge Date: 11/20/23                         HH Arranged: RN, Disease Management, PT, OT, Nurse's Aide HH Agency: [facility] Date [facility] Agency Contacted: 11/15/23 Time HH Agency Contacted: [phone] Representative spoke with at [facility] Agency: Clifton Custard   Social Determinants of Health (SDOH) Interventions SDOH Screenings   Food Insecurity: Food Insecurity Present (11/14/2023)  Housing: Low Risk  (11/17/2023)  Recent Concern: Housing - High Risk (11/14/2023)  Transportation Needs: No Transportation Needs (11/17/2023)  Utilities: Not At Risk (11/14/2023)  Alcohol Screen: Low Risk  (11/17/2023)  Depression (PHQ2-9): Medium Risk (09/16/2023)  Financial Resource Strain: Low Risk  (11/17/2023)  Physical Activity: Inactive (05/11/2023)  Social Connections: Socially Isolated (11/14/2023)  Stress: No Stress Concern Present (05/11/2023)  Tobacco Use: Medium Risk (11/14/2023)  Health Literacy: Adequate Health Literacy (05/11/2023)    Readmission Risk Interventions     No data to display

## 2023-11-20 NOTE — Progress Notes (Signed)
 PROGRESS NOTE [name]  ZOX:[id number] DOB: 06-15-1951 DOA: 11/14/2023 PCP: Melida Quitter, PA  Brief Narrative/Hospital Course: 73 year old female with medical history significant for nonischemic cardiomyopathy, mitral regurgitation, SVT, GERD, hyperlipidemia, IBS, recent bimalleolar ankle fracture after fall presented from SNF for evaluation of increased heart rate, shortness of breath for 2 days.  Patient is noted to have atrial tachycardia, started on Cardizem drip admitted to [facility] service for further management. Cardiology consulted, started on amiodarone drip, diuretics for a flutter/A-fib with RVR MAT, acute on chronic HFrEF w/ pericardial effusion .Subsequently noted to be severe atrial functional MR-cardiology recommended structural team evaluation for mitraclip. At this time patient volume status optimized to transition to oral diuretic 3/21, planning for structural team evaluation for MitraClip 12/03/2023 outpatient visit.  Patient is medically stable to discharge to skilled nursing facility  Consultation: Cardiology   Subjective: Seen this am Resting well No complaints Awaiting snf  Assessment and Plan: Principal Problem:   Atrial tachycardia (HCC) Active Problems:   Hypokalemia   Mixed hyperlipidemia   COPD mixed type (HCC)   Protein-calorie malnutrition, severe (HCC)   Chronic respiratory failure with hypoxia (HCC)   Closed bimalleolar fracture of right ankle  Paroxysmal atrial flutter/A-fib with RVR  Multifocal Atrial tachycardia:  Hx of SVT, Zio in fall 2024 noted svt  no afib.  Workup with no PE on CTA Cardiology following s/p TEE 3/18. Amiodarone drip> changed to p.o. amiodarone 3/20,cont Eliquis, p.o.cardizem   Acute on Chronic HFrEF with recovered EF Severe MR H/o non ischemic cardiomyopathy Left-sided pleural effusion Moderate pericardial effusion: NICM after treatment of breast cancer, chronic HFrEF with recovery with last EF of 60-65%. 3/18-TEE  > EF 55-60%, severe MR>Per cardiology evaluating for MitraClip ahs fu on 4/425 as OP Diuresed well with Lasix transitioned to p.o. 3/21.  Continue to monitor weight regularly as outpatient and continue salt and fluid restriction   Hypokalemia: Hypomagnesemia: Replaced   COPD on chronic oxygen Chronic respiratory failure with hypoxia on 3 L [place]: Stable on home o2,inhalers and PRN nebs.    Abdominal discomfort, nausea-appears chronic: Pyelonephritis on CT abdomen Urinalysis fairly unremarkable.started ciprofloxacin 400 mg bid> cont same to complete course. Norovirus negative.  Continue PPI antiemetics.  Recommend outpatient GI evaluation   Hyperlipidemia Cont statins.   Osteoporosis: S/p recent repair for bimalleolar ankle fracture after a fall cont ptot fall precaution, aspiration precaution   Severe malnutrition BMI 15.16.  Continue augment diet  DVT prophylaxis: Eliquis Code Status:   Code Status: Full Code Family Communication: plan of care discussed with patient at bedside. Patient status is: Remains hospitalized because of severity of illness Level of care: Progressive   Dispo: The patient is from: home alone            Anticipated disposition: awaiting auth for snf. Stable for /dc  Objective: Vitals last 24 hrs: Vitals:   11/19/23 1205 11/19/23 1433 11/19/23 1930 11/20/23 0320  BP: 105/71 101/81 (!) 92/58 107/64  Pulse: 85 84 76 74  Resp: (!) 21  20 16   Temp: 98.2 F (36.8 C) 98.2 F (36.8 C) 98.9 F (37.2 C) 98.5 F (36.9 C)  TempSrc: Oral Axillary Oral Oral  SpO2: 100%  99% 100%  Weight:    39.6 kg  Height:       Weight change:   Physical Examination: General exam: alert awake, oriented at baseline, older than stated age HEENT:Oral mucosa moist, Ear/Nose WNL grossly Respiratory system: Bilaterally clear BS,no use of accessory muscle Cardiovascular  system: S1 & S2 +, No JVD. Gastrointestinal system: Abdomen soft,NT,ND, BS+ Nervous System: Alert, awake,  moving all extremities,and following commands. Extremities: LE edema neg,distal peripheral pulses palpable and warm.  Skin: No rashes,no icterus. MSK: Normal muscle bulk,tone, power   Medications reviewed:  Scheduled Meds:  amiodarone  400 mg Oral BID   Followed by   Melene Muller ON 11/25/2023] amiodarone  200 mg Oral Daily   apixaban  5 mg Oral BID   atorvastatin  40 mg Oral Daily   citalopram  20 mg Oral q AM   diltiazem  120 mg Oral Daily   docusate sodium  100 mg Oral Daily   furosemide  40 mg Oral Daily   magnesium oxide  400 mg Oral Daily   multivitamin with minerals  1 tablet Oral Daily   pantoprazole  40 mg Oral Daily   umeclidinium bromide  1 puff Inhalation Daily   Continuous Infusions:  ciprofloxacin 400 mg (11/20/23 0455)    Diet Order             Diet regular Room service appropriate? Yes with Assist; Fluid consistency: Thin  Diet effective now                  Intake/Output Summary (Last 24 hours) at 11/20/2023 1143 Last data filed at 11/20/2023 0800 Gross per 24 hour  Intake 1045.2 ml  Output 1320 ml  Net -274.8 ml   Net IO Since Admission: -1,594.76 mL [11/20/23 1143]  Wt Readings from Last 3 Encounters:  11/20/23 39.6 kg  10/12/23 41.7 kg  10/04/23 41.7 kg     Unresulted Labs (From admission, onward)     Start     Ordered   11/19/23 0500  Basic metabolic panel  Daily,   R     Question:  Specimen collection method  Answer:  Lab=Lab collect   11/18/23 1133   11/19/23 0500  Magnesium  Daily,   R     Question:  Specimen collection method  Answer:  Lab=Lab collect   11/18/23 1133          Data Reviewed: I have personally reviewed following labs and imaging studies CBC: Recent Labs  Lab 11/14/23 1250 11/15/23 0120 11/15/23 0400 11/16/23 0631  WBC 9.7 10.2 9.0 7.9  HGB 9.8* 9.3* 9.0* 10.1*  HCT 30.5* 29.6* 28.5* 31.1*  MCV 91.6 92.2 91.1 88.1  PLT 557* 475* 575* 708*   Basic Metabolic Panel:  Recent Labs  Lab 11/16/23 0631  11/16/23 1133 11/17/23 0408 11/17/23 0857 11/18/23 0446 11/19/23 0426 11/20/23 0353  NA 137  --  139 139 138 137 138  K 2.9*   < > 3.4* 4.0 3.4* 3.2* 3.9  CL 89*  --  96* 97* 97* 93* 94*  CO2 34*  --  30 33* 32 34* 31  GLUCOSE 91  --  140* 133* 130* 102* 107*  BUN 9  --  8 <5* 6* 9 14  CREATININE 0.70  --  0.71 0.68 0.80 0.90 0.89  CALCIUM 8.6*  --  8.4* 8.5* 8.2* 8.8* 9.4  MG 1.6*  --  2.3  --  1.8 1.8 1.8   < > = values in this interval not displayed.   GFR: Estimated Creatinine Clearance: 35.2 mL/min (by C-G formula based on SCr of 0.89 mg/dL). Liver Function Tests:  Recent Labs  Lab 11/15/23 0400  AST 65*  ALT 17  ALKPHOS 61  BILITOT 1.9*  PROT 6.7  ALBUMIN 3.1*  No results for input(s): "HGBA1C" in the last 72 hours. No results for input(s): "GLUCAP" in the last 168 hours. No results for input(s): "CHOL", "HDL", "LDLCALC", "TRIG", "CHOLHDL", "LDLDIRECT" in the last 72 hours. Sepsis Labs: No results for input(s): "PROCALCITON", "LATICACIDVEN" in the last 168 hours. Recent Results (from the past 240 hours)  Blood culture (routine x 2)     Status: None   Collection Time: 11/14/23  1:23 PM   Specimen: BLOOD RIGHT ARM  Result Value Ref Range Status   Specimen Description BLOOD RIGHT ARM  Final   Special Requests   Final    BOTTLES DRAWN AEROBIC AND ANAEROBIC Blood Culture adequate volume   Culture   Final    NO GROWTH 5 DAYS Performed at [facility], [address], [place], [place] [address]    Report Status 11/19/2023 FINAL  Final  Blood culture (routine x 2)     Status: None   Collection Time: 11/14/23  1:28 PM   Specimen: BLOOD  Result Value Ref Range Status   Specimen Description BLOOD RIGHT ANTECUBITAL  Final   Special Requests   Final    BOTTLES DRAWN AEROBIC AND ANAEROBIC Blood Culture adequate volume   Culture   Final    NO GROWTH 5 DAYS Performed at [facility], [address] [place], [place] [address]    Report Status 11/19/2023  FINAL  Final    Antimicrobials/Microbiology: Anti-infectives (From admission, onward)    Start     Dose/Rate Route Frequency Ordered Stop   11/20/23 0000  ciprofloxacin (CIPRO) 500 MG tablet        500 mg Oral 2 times daily 11/20/23 1111 11/23/23 2359   11/16/23 1600  ciprofloxacin (CIPRO) IVPB 400 mg        400 mg 200 mL/hr over 60 Minutes Intravenous Every 12 hours 11/16/23 1558     11/14/23 1330  cefTRIAXone (ROCEPHIN) 2 g in sodium chloride 0.9 % 100 mL IVPB        2 g 200 mL/hr over 30 Minutes Intravenous  Once 11/14/23 1322 11/14/23 1525   11/14/23 1330  azithromycin (ZITHROMAX) 500 mg in sodium chloride 0.9 % 250 mL IVPB        500 mg 250 mL/hr over 60 Minutes Intravenous  Once 11/14/23 1322 11/14/23 1634         Component Value Date/Time   SDES BLOOD RIGHT ANTECUBITAL 11/14/2023 1328   SPECREQUEST  11/14/2023 1328    BOTTLES DRAWN AEROBIC AND ANAEROBIC Blood Culture adequate volume   CULT  11/14/2023 1328    NO GROWTH 5 DAYS Performed at [facility], [address] [place], [place] [address]    REPTSTATUS 11/19/2023 FINAL 11/14/2023 1328  Radiology Studies: No results found.  LOS: 6 days  Total time spent in review of labs and imaging, patient evaluation, formulation of plan, documentation and communication with family: 35 minutes Lanae Boast, MD [facility]  11/20/2023, 11:43 AM

## 2023-11-20 NOTE — Discharge Summary (Signed)
 Physician Discharge Summary  [name]:[id number] DOB: [birth year] DOA: 11/14/2023  PCP: Melida Quitter, PA  Admit date: 11/14/2023 Discharge date: 11/22/2023 Recommendations for Outpatient Follow-up:  Follow up with PCP in 1 weeks-call for appointment Please obtain BMP/CBC in one week  Discharge Dispo: SNF Discharge Condition: Stable Code Status:   Code Status: Full Code Diet recommendation:  Diet Order             Diet regular Room service appropriate? Yes with Assist; Fluid consistency: Thin  Diet effective now                    Brief/Interim Summary: 73 year old female with medical history significant for nonischemic cardiomyopathy, mitral regurgitation, SVT, GERD, hyperlipidemia, IBS, recent bimalleolar ankle fracture after fall presented from SNF for evaluation of increased heart rate, shortness of breath for 2 days.  Patient is noted to have atrial tachycardia, started on Cardizem drip admitted to [facility] service for further management. Cardiology consulted, started on amiodarone drip, diuretics for a flutter/A-fib with RVR MAT, acute on chronic HFrEF w/ pericardial effusion. Subsequently noted to be severe atrial functional MR-cardiology recommended structural team evaluation for mitraclip. At this time patient volume status optimized to transition to oral diuretic 3/21, planning for structural team evaluation for MitraClip 12/03/2023 outpatient visit.  Patient is medically stable to discharge to skilled nursing facility Awaiting on Insurance approval and peer to peer done 11/22/23  Consultation: Cardiology     Discharge Diagnoses:  Principal Problem:   Atrial tachycardia (HCC) Active Problems:   Hypokalemia   Mixed hyperlipidemia   COPD mixed type (HCC)   Protein-calorie malnutrition, severe (HCC)   Chronic respiratory failure with hypoxia (HCC)   Closed bimalleolar fracture of right ankle -- Paroxysmal atrial flutter/A-fib with RVR  Multifocal Atrial  tachycardia:  Hx of SVT, Zio in fall 2024 noted svt, no afib.  Workup with no PE on CTA. Seen by cardio-s/p TEE 3/18. Amiodarone drip> changed to p.o. amiodarone 3/20 cont 400 mg bid intil 3/26 then 200 mg daily from 3/27 Cont Eliquis, p.o.cardizem   Acute on Chronic HFrEF with recovered EF Severe MR NICM Hx Left-sided pleural effusion Moderate pericardial effusion: NICM after treatment of breast cancer, chronic HFrEF with recovery with last EF of 60-65%. 3/18-TEE > EF 55-60%, severe MR>Per cardiology evaluating for MitraClip has follow up on 4/425 as OP. Diuresed well with Lasix transitioned to p.o. 3/21. Continue to monitor weight regularly as outpatient and continue salt and fluid restriction   Hypokalemia: Hypomagnesemia: Improved.   COPD on chronic oxygen Chronic respiratory failure with hypoxia on 3 L : Stable on home o2.Cont inhalers and PRN nebs.   Abdominal discomfort, nausea-appears chronic Pyelonephritis on CT abdomen Urinalysis fairly unremarkable. Cont and complete Ciprofloxacin. Doing well Norovirus negative. Continue PPI antiemetics.Recommend outpatient GI evaluation.   Hyperlipidemia Cont statins.   Osteoporosis: S/p recent repair for bimalleolar ankle fracture after a fall cont ptot fall precaution, aspiration precaution. Cont OP F/U   Severe malnutrition: BMI 15.16.  Continue augment diet  Subjective: Alert awake oriented x3 eager t ogo to snf awaiting approval  Discharge Exam: Vitals:   11/22/23 0733 11/22/23 1226  BP: (!) 108/50 (!) 103/59  Pulse: 77 81  Resp: 16 16  Temp: 98.7 F (37.1 C) 99.9 F (37.7 C)  SpO2: 100% 100%   General: Pt is alert, awake, not in acute distress Cardiovascular: RRR, S1/S2 +, no rubs, no gallops Respiratory: CTA bilaterally, no wheezing, no rhonchi Abdominal: Soft,  NT, ND, bowel sounds + Extremities: no edema, no cyanosis  Discharge Instructions  Discharge Instructions     (HEART FAILURE PATIENTS) Call MD:   Anytime you have any of the following symptoms: 1) 3 pound weight gain in 24 hours or 5 pounds in 1 week 2) shortness of breath, with or without a dry hacking cough 3) swelling in the hands, feet or stomach 4) if you have to sleep on extra pillows at night in order to breathe.   Complete by: As directed    Amb referral to AFIB Clinic   Complete by: As directed    Discharge instructions   Complete by: As directed    Please call call MD or return to ER for similar or worsening recurring problem that brought you to hospital or if any fever,nausea/vomiting,abdominal pain, uncontrolled pain, chest pain,  shortness of breath or any other alarming symptoms.  Please follow-up your doctor as instructed in a week time and call the office for appointment.  Please avoid alcohol, smoking, or any other illicit substance and maintain healthy habits including taking your regular medications as prescribed.  You were cared for by a hospitalist during your hospital stay. If you have any questions about your discharge medications or the care you received while you were in the hospital after you are discharged, you can call the unit and ask to speak with the hospitalist on call if the hospitalist that took care of you is not available.  Once you are discharged, your primary care physician will handle any further medical issues. Please note that NO REFILLS for any discharge medications will be authorized once you are discharged, as it is imperative that you return to your primary care physician (or establish a relationship with a primary care physician if you do not have one) for your aftercare needs so that they can reassess your need for medications and monitor your lab values   Increase activity slowly   Complete by: As directed       Allergies as of 11/22/2023       Reactions   Amoxicillin-pot Clavulanate    diarrhea   Doxycycline    Nausea & vomiting   Venlafaxine    ? Reaction; ? Blurred vision         Medication List     STOP taking these medications    metoprolol tartrate 25 MG tablet Commonly known as: LOPRESSOR       TAKE these medications    acetaminophen 500 MG tablet Commonly known as: TYLENOL Take 1,000 mg by mouth every 6 (six) hours as needed for moderate pain (pain score 4-6) or mild pain (pain score 1-3).   albuterol 108 (90 Base) MCG/ACT inhaler Commonly known as: VENTOLIN HFA Inhale 2 puffs every 6 hours as needed   amiodarone 200 MG tablet Commonly known as: PACERONE Take 2 tablets (400 mg total) by mouth 2 (two) times daily for 3 days, THEN 1 tablet (200 mg total) daily. Start taking on: November 22, 2023   apixaban 5 MG Tabs tablet Commonly known as: ELIQUIS Take 1 tablet (5 mg total) by mouth 2 (two) times daily.   atorvastatin 40 MG tablet Commonly known as: LIPITOR Take 1 tablet (40 mg total) by mouth daily.   Calcium 600+D Plus Minerals 600-400 MG-UNIT Tabs Take 1 tablet by mouth in the morning.   ciprofloxacin 500 MG tablet Commonly known as: Cipro Take 1 tablet (500 mg total) by mouth 2 (two) times daily for 3  days.   citalopram 20 MG tablet Commonly known as: CELEXA Take 1 tablet (20 mg total) by mouth in the morning. What changed:  medication strength how much to take   diltiazem 120 MG 24 hr capsule Commonly known as: CARDIZEM CD Take 1 capsule (120 mg total) by mouth daily.   docusate sodium 100 MG capsule Commonly known as: COLACE Take 100 mg by mouth daily.   furosemide 40 MG tablet Commonly known as: LASIX Take 1 tablet (40 mg total) by mouth daily.   hydrOXYzine 10 MG tablet Commonly known as: ATARAX Take 10 mg by mouth every 6 (six) hours as needed for anxiety.   magnesium oxide 400 (240 Mg) MG tablet Commonly known as: MAG-OX Take 400 mg by mouth daily.   methocarbamol 750 MG tablet Commonly known as: ROBAXIN TAKE 1 TABLET BY MOUTH 2 TIMES DAILY AS NEEDED FOR MUSCLE SPASMS (NON FORMULARY)   multivitamin with  minerals tablet Take 1 tablet by mouth daily.   ondansetron 4 MG disintegrating tablet Commonly known as: ZOFRAN-ODT Take 4 mg by mouth every 8 (eight) hours as needed.   oxyCODONE 5 MG immediate release tablet Commonly known as: Roxicodone Take 1 tablet (5 mg total) by mouth every 4 (four) hours as needed for up to 5 doses for severe pain (pain score 7-10) or breakthrough pain.   pantoprazole 20 MG tablet Commonly known as: PROTONIX Take 20 mg by mouth daily.   umeclidinium bromide 62.5 MCG/ACT Aepb Commonly known as: INCRUSE ELLIPTA Inhale 1 puff into the lungs daily.        Follow-up Information     [facility]. Go in 5 day(s).   Specialty: Cardiology Why: Hospital follow up 11/24/2023 @ 2:45 pm PLEASE bring a current medication list to appointment FREE valet parking, Entrance C, off [employer] information: [address] [place] [address] [phone]        Saralyn Pilar A, PA Follow up in 1 week(s).   Specialties: Family Medicine, Obstetrics Contact information: [address] [place] [address] [phone]                Allergies  Allergen Reactions   Amoxicillin-Pot Clavulanate     diarrhea   Doxycycline     Nausea & vomiting   Venlafaxine     ? Reaction; ? Blurred vision    The results of significant diagnostics from this hospitalization (including imaging, microbiology, ancillary and laboratory) are listed below for reference.    Microbiology: Recent Results (from the past 240 hours)  Blood culture (routine x 2)     Status: None   Collection Time: 11/14/23  1:23 PM   Specimen: BLOOD RIGHT ARM  Result Value Ref Range Status   Specimen Description BLOOD RIGHT ARM  Final   Special Requests   Final    BOTTLES DRAWN AEROBIC AND ANAEROBIC Blood Culture adequate volume   Culture   Final    NO GROWTH 5 DAYS Performed at [facility],  [address] [place], [place] [address]    Report Status 11/19/2023 FINAL  Final  Blood culture (routine x 2)     Status: None   Collection Time: 11/14/23  1:28 PM   Specimen: BLOOD  Result Value Ref Range Status   Specimen Description BLOOD RIGHT ANTECUBITAL  Final   Special Requests   Final    BOTTLES DRAWN AEROBIC AND ANAEROBIC Blood Culture adequate  volume   Culture   Final    NO GROWTH 5 DAYS Performed at [facility], [address] [place], [place] [address]    Report Status 11/19/2023 FINAL  Final    Procedures/Studies: DG CHEST PORT 1 VIEW Result Date: 11/17/2023 CLINICAL DATA:  Heart failure. EXAM: PORTABLE CHEST 1 VIEW COMPARISON:  Chest radiographs 11/14/2023 and 12/22/2022; CT chest 11/14/2023 FINDINGS: Cardiac silhouette and mediastinal contours are within limits. There is a right basilar heterogeneous airspace opacity similar to prior. Small bilateral pleural effusions obscure the costophrenic angles, similar to prior. No pneumothorax. Mild dextrocurvature of the lower thoracic spine. Surgical clips overlie the lateral aspect of the lateral chest walls and the left axilla, related to bilateral 1 mastectomies and left axillary lymph node the sections seen on prior CT. There lucent cystic emphysematous changes again seen. Mild levocurvature of the upper thoracic spine and mild dextrocurvature of the thoracolumbar junction. ACDF hardware overlies the lower cervical spine. IMPRESSION: 1. Right basilar heterogeneous airspace opacity, similar to 11/14/2023 but new from 12/22/2022. This may represent atelectasis or pneumonia. 2. Small bilateral pleural effusions, also similar to 11/14/2023 but new from 12/22/2022. 3. Chronic emphysematous changes. Electronically Signed   By: Neita Garnet M.D.   On: 11/17/2023 16:54   ECHO TEE Result Date: 11/16/2023    TRANSESOPHOGEAL ECHO REPORT   Patient Name:   KILEY SOLIMINE Date of Exam: 11/16/2023 Medical Rec #:  [id number]         Height:        60.0 in Accession #:    [id number]        Weight:       77.6 lb Date of Birth:  11/17/1950        BSA:          1.248 m Patient Age:    72 years          BP:           111/66 mmHg Patient Gender: F                 HR:           99 bpm. Exam Location:  Inpatient Procedure: Color Doppler, Cardiac Doppler and Transesophageal Echo (Both            Spectral and Color Flow Doppler were utilized during procedure). Indications:     Cardioversion  History:         Patient has prior history of Echocardiogram examinations, most                  recent 11/15/2023.  Sonographer:     Harriette Bouillon RDCS Referring Phys:  [id number] ZANE ADAMS Diagnosing Phys: Thomasene Ripple DO PROCEDURE: The transesophogeal probe was passed without difficulty through the esophogus of the patient. Sedation performed by different physician. The patient developed no complications during the procedure.  IMPRESSIONS  1. Left ventricular ejection fraction, by estimation, is 55 to 60%. The left ventricle has normal function.  2. Right ventricular systolic function is normal. The right ventricular size is normal.  3. Left atrial size was mildly dilated. No left atrial/left atrial appendage thrombus was detected.  4. The mitral valve is abnormal. Severe mitral valve regurgitation. No evidence of mitral stenosis.  5. The tricuspid valve is abnormal. Tricuspid valve regurgitation is mild to moderate.  6. The aortic valve is tricuspid. Aortic valve regurgitation is trivial. No aortic stenosis is present.  7. There is borderline dilatation  of the ascending aorta, measuring 36 mm. FINDINGS  Left Ventricle: Left ventricular ejection fraction, by estimation, is 55 to 60%. The left ventricle has normal function. The left ventricular internal cavity size was small. Right Ventricle: The right ventricular size is normal. No increase in right ventricular wall thickness. Right ventricular systolic function is normal. Left Atrium: Left atrial size was mildly dilated. No  left atrial/left atrial appendage thrombus was detected. Right Atrium: Right atrial size was not well visualized. Pericardium: Trivial pericardial effusion is present. The pericardial effusion is circumferential. Mitral Valve: The mitral valve is abnormal. Severe mitral valve regurgitation. No evidence of mitral valve stenosis. Tricuspid Valve: The tricuspid valve is abnormal. Tricuspid valve regurgitation is mild to moderate. No evidence of tricuspid stenosis. Aortic Valve: The aortic valve is tricuspid. Aortic valve regurgitation is trivial. No aortic stenosis is present. Pulmonic Valve: The pulmonic valve was not well visualized. Pulmonic valve regurgitation is trivial. No evidence of pulmonic stenosis. Aorta: There is borderline dilatation of the ascending aorta, measuring 36 mm. There is minimal (Grade I) layered plaque involving the aortic root. Pulmonary Artery: The pulmonary artery is of normal size. IAS/Shunts: No atrial level shunt detected by color flow Doppler.   AORTA Ao Asc diam: 3.60 cm Lavona Mound Tobb DO Electronically signed by Thomasene Ripple DO Signature Date/Time: 11/16/2023/6:05:04 PM    Final    EP STUDY Result Date: 11/16/2023 See surgical note for result.  VAS [place] LOWER EXTREMITY VENOUS (DVT) (ONLY [facility] & [facility]) Result Date: 11/15/2023  Lower Venous DVT Study Patient Name:  ALBERTHA BEATTIE  Date of Exam:   11/14/2023 Medical Rec #: [id number]          Accession #:    [id number] Date of Birth: [birth year]         Patient Gender: F Patient Age:   12 years Exam Location:  [facility] Procedure:      VAS [place] LOWER EXTREMITY VENOUS (DVT) Referring Phys: Molly Maduro PATERSON --------------------------------------------------------------------------------  Indications: Edema, and Patient tachycardic on arrival with pulse between 190-220, now post Cardizem rate decreased to 140s-160s..  Risk Factors: Right ORIF of ankle 10/12/23. Limitations: Adult undergarment shadowing in bilateral groins Comparison Study:  No prior study on file Performing Technologist: Sherren Kerns RVS  Examination Guidelines: A complete evaluation includes B-mode imaging, spectral Doppler, color Doppler, and power Doppler as needed of all accessible portions of each vessel. Bilateral testing is considered an integral part of a complete examination. Limited examinations for reoccurring indications may be performed as noted. The reflux portion of the exam is performed with the patient in reverse Trendelenburg.  +---------+---------------+---------+-----------+----------+--------------+ RIGHT    CompressibilityPhasicitySpontaneityPropertiesThrombus Aging +---------+---------------+---------+-----------+----------+--------------+ CFV      Full           Yes      No                                  +---------+---------------+---------+-----------+----------+--------------+ SFJ      Full                                                        +---------+---------------+---------+-----------+----------+--------------+ FV Prox  Full                                                        +---------+---------------+---------+-----------+----------+--------------+  FV Mid   Full                                                        +---------+---------------+---------+-----------+----------+--------------+ FV DistalFull                                                        +---------+---------------+---------+-----------+----------+--------------+ PFV      Full                                                        +---------+---------------+---------+-----------+----------+--------------+ POP      Full           No       Yes                                 +---------+---------------+---------+-----------+----------+--------------+ PTV      Full                                                        +---------+---------------+---------+-----------+----------+--------------+ PERO     Full                                                         +---------+---------------+---------+-----------+----------+--------------+   +----+---------------+---------+-----------+----------+--------------+ LEFTCompressibilityPhasicitySpontaneityPropertiesThrombus Aging +----+---------------+---------+-----------+----------+--------------+ CFV Full           No       Yes                                 +----+---------------+---------+-----------+----------+--------------+ SFJ Full                                                        +----+---------------+---------+-----------+----------+--------------+     Summary: RIGHT: - There is no evidence of deep vein thrombosis in the lower extremity.  - No cystic structure found in the popliteal fossa. interstitial edema noted throughout  LEFT: - No evidence of common femoral vein obstruction.   *See table(s) above for measurements and observations. Electronically signed by Gerarda Fraction on 11/15/2023 at 5:28:30 PM.    Final    ECHOCARDIOGRAM COMPLETE Result Date: 11/15/2023    ECHOCARDIOGRAM REPORT   Patient Name:   HANIYYAH SAKUMA Date of Exam: 11/15/2023 Medical Rec #:  [id number]         Height:       60.0 in Accession #:  [id number]        Weight:       92.0 lb Date of Birth:  [birth year]        BSA:          1.342 m Patient Age:    72 years          BP:           135/93 mmHg Patient Gender: F                 HR:           133 bpm. Exam Location:  Inpatient Procedure: 2D Echo, Color Doppler and Cardiac Doppler (Both Spectral and Color            Flow Doppler were utilized during procedure). Indications:    Atrial fibrillation  History:        Patient has prior history of Echocardiogram examinations.                 Cardiomyopathy, COPD, Arrythmias:Tachycardia; Risk                 Factors:Former Smoker.  Sonographer:    Lamont Snowball Referring Phys: [id number] SARA-MAIZ A THOMAS IMPRESSIONS  1. Left ventricular ejection fraction, by estimation, is 60  to 65%. The left ventricle has normal function. The left ventricle has no regional wall motion abnormalities. Left ventricular diastolic function could not be evaluated.  2. Right ventricular systolic function is normal. The right ventricular size is normal. There is normal pulmonary artery systolic pressure. The estimated right ventricular systolic pressure is 35.8 mmHg.  3. Left atrial size was severely dilated.  4. Right atrial size was severely dilated.  5. Moderate pericardial effusion. The pericardial effusion is posterior to the left ventricle. There is no evidence of cardiac tamponade.  6. The mitral valve is abnormal. Severe mitral valve regurgitation. No evidence of mitral stenosis.  7. The aortic valve is tricuspid. There is mild calcification of the aortic valve. Aortic valve regurgitation is not visualized. Aortic valve sclerosis/calcification is present, without any evidence of aortic stenosis.  8. Aortic dilatation noted. There is mild dilatation of the ascending aorta, measuring 41 mm.  9. The inferior vena cava is dilated in size with <50% respiratory variability, suggesting right atrial pressure of 15 mmHg. FINDINGS  Left Ventricle: Left ventricular ejection fraction, by estimation, is 60 to 65%. The left ventricle has normal function. The left ventricle has no regional wall motion abnormalities. The left ventricular internal cavity size was normal in size. There is  no left ventricular hypertrophy. Left ventricular diastolic function could not be evaluated due to atrial fibrillation. Left ventricular diastolic function could not be evaluated. Right Ventricle: The right ventricular size is normal. No increase in right ventricular wall thickness. Right ventricular systolic function is normal. There is normal pulmonary artery systolic pressure. The tricuspid regurgitant velocity is 2.28 m/s, and  with an assumed right atrial pressure of 15 mmHg, the estimated right ventricular systolic pressure is  35.8 mmHg. Left Atrium: Left atrial size was severely dilated. Right Atrium: Right atrial size was severely dilated. Pericardium: A moderately sized pericardial effusion is present. The pericardial effusion is posterior to the left ventricle. There is no evidence of cardiac tamponade. Mitral Valve: The mitral valve is abnormal. Severe mitral valve regurgitation, with centrally-directed jet. No evidence of mitral valve stenosis. MV peak gradient, 10.2 mmHg. The mean mitral valve gradient is 4.3 mmHg. Tricuspid Valve: The tricuspid valve is normal in  structure. Tricuspid valve regurgitation is mild . No evidence of tricuspid stenosis. Aortic Valve: The aortic valve is tricuspid. There is mild calcification of the aortic valve. Aortic valve regurgitation is not visualized. Aortic valve sclerosis/calcification is present, without any evidence of aortic stenosis. Aortic valve peak gradient measures 7.8 mmHg. Pulmonic Valve: The pulmonic valve was normal in structure. Pulmonic valve regurgitation is trivial. No evidence of pulmonic stenosis. Aorta: Aortic dilatation noted. There is mild dilatation of the ascending aorta, measuring 41 mm. Venous: The inferior vena cava is dilated in size with less than 50% respiratory variability, suggesting right atrial pressure of 15 mmHg. IAS/Shunts: No atrial level shunt detected by color flow Doppler.  LEFT VENTRICLE PLAX 2D LVIDd:         4.60 cm LVIDs:         3.00 cm LV PW:         1.30 cm LV IVS:        0.60 cm LVOT diam:     1.90 cm LV SV:         50 LV SV Index:   37 LVOT Area:     2.84 cm  RIGHT VENTRICLE          IVC RV Basal diam:  3.10 cm  IVC diam: 2.10 cm LEFT ATRIUM             Index        RIGHT ATRIUM           Index LA diam:        4.50 cm 3.35 cm/m   RA Area:     20.90 cm LA Vol (A2C):   63.4 ml 47.25 ml/m  RA Volume:   51.50 ml  38.38 ml/m LA Vol (A4C):   57.9 ml 43.15 ml/m LA Biplane Vol: 64.8 ml 48.29 ml/m  AORTIC VALVE AV Area (Vmax): 2.54 cm AV Vmax:         139.67 cm/s AV Peak Grad:   7.8 mmHg LVOT Vmax:      125.33 cm/s LVOT Vmean:     79.300 cm/s LVOT VTI:       0.175 m  AORTA Ao Root diam: 3.70 cm Ao Asc diam:  4.10 cm MITRAL VALVE                 TRICUSPID VALVE MV Area (PHT): 4.79 cm      TR Peak grad:   20.8 mmHg MV Area VTI:   2.55 cm      TR Vmax:        228.00 cm/s MV Peak grad:  10.2 mmHg MV Mean grad:  4.3 mmHg      SHUNTS MV Vmax:       1.59 m/s      Systemic VTI:  0.17 m MV Vmean:      94.9 cm/s     Systemic Diam: 1.90 cm MV Decel Time: 158 msec MR Peak grad:   115.3 mmHg MR Mean grad:   86.0 mmHg MR Vmax:        537.00 cm/s MR Vmean:       445.5 cm/s MR PISA:        4.02 cm MR PISA Radius: 0.80 cm MV E velocity: 149.67 cm/s Arvilla Meres MD Electronically signed by Arvilla Meres MD Signature Date/Time: 11/15/2023/11:11:39 AM    Final    CT ABDOMEN PELVIS W CONTRAST Result Date: 11/15/2023 CLINICAL DATA:  Acute abdominal pain EXAM: CT ABDOMEN AND PELVIS WITH  CONTRAST TECHNIQUE: Multidetector CT imaging of the abdomen and pelvis was performed using the standard protocol following bolus administration of intravenous contrast. RADIATION DOSE REDUCTION: This exam was performed according to the departmental dose-optimization program which includes automated exposure control, adjustment of the mA and/or kV according to patient size and/or use of iterative reconstruction technique. CONTRAST:  50mL OMNIPAQUE IOHEXOL 350 MG/ML SOLN COMPARISON:  None Available. FINDINGS: Lower Chest: Small pleural effusions with bibasilar atelectasis. Hepatobiliary: Normal hepatic contours. No intra- or extrahepatic biliary dilatation. Status post cholecystectomy. Pancreas: Normal pancreas. No ductal dilatation or peripancreatic fluid collection. Spleen: Normal. Adrenals/Urinary Tract: Poor visualization of the adrenal glands. Heterogeneous contrast enhancement of the interpolar right kidney. Normal left kidney. The urinary bladder is normal for degree of distention  Stomach/Bowel: There is no hiatal hernia. Normal duodenal course and caliber. No small bowel dilatation or inflammation. No focal colonic abnormality. Normal appendix. Vascular/Lymphatic: There is calcific atherosclerosis of the abdominal aorta. No lymphadenopathy. Reproductive: Normal uterus. No adnexal mass. Other: Small volume left upper quadrant free fluid. Musculoskeletal: Dextroscoliosis with apex at L2. IMPRESSION: 1. Heterogeneous contrast enhancement of the interpolar right kidney, concerning for pyelonephritis. Correlate with urinalysis. 2. Small pleural effusions with bibasilar atelectasis. 3. Small amount of free fluid in the left upper quadrant. Electronically Signed   By: Deatra Robinson M.D.   On: 11/15/2023 02:20   CT Angio Chest PE W and/or Wo Contrast Result Date: 11/14/2023 CLINICAL DATA:  Shortness of breath.  PE suspected EXAM: CT ANGIOGRAPHY CHEST WITH CONTRAST TECHNIQUE: Multidetector CT imaging of the chest was performed using the standard protocol during bolus administration of intravenous contrast. Multiplanar CT image reconstructions and MIPs were obtained to evaluate the vascular anatomy. RADIATION DOSE REDUCTION: This exam was performed according to the departmental dose-optimization program which includes automated exposure control, adjustment of the mA and/or kV according to patient size and/or use of iterative reconstruction technique. CONTRAST:  50mL OMNIPAQUE IOHEXOL 350 MG/ML SOLN COMPARISON:  Same day chest radiograph and PET/CT 12/27/1998 FINDINGS: Cardiovascular: Cardiomegaly. Small pericardial effusion. Negative for acute pulmonary embolism. Aortic and coronary artery atherosclerotic calcification. Mediastinum/Nodes: Trachea and esophagus are unremarkable. Shotty mediastinal lymph nodes are likely reactive. Lungs/Pleura: Respiratory motion obscures detail. Emphysema. Small right-greater-than-left pleural effusions and compressive atelectasis. Mild diffuse bronchial wall  thickening. Question interlobular septal thickening though evaluation is limited by motion artifact. No pneumothorax. Upper Abdomen: Low-density free fluid in the left upper quadrant. Musculoskeletal: No acute fracture. Postoperative changes both breasts. Review of the MIP images confirms the above findings. IMPRESSION: 1. Negative for acute pulmonary embolism. 2. Question interstitial pulmonary edema. Small right-greater-than-left pleural effusions and compressive atelectasis. 3. Cardiomegaly with small pericardial effusion. 4. Low-density free fluid in the left upper quadrant. CT abdomen pelvis with IV contrast is recommended for further evaluation. Aortic Atherosclerosis (ICD10-I70.0) and Emphysema (ICD10-J43.9). Electronically Signed   By: Minerva Fester M.D.   On: 11/14/2023 19:08   DG Chest Portable 1 View Result Date: 11/14/2023 CLINICAL DATA:  Short of breath. EXAM: PORTABLE CHEST 1 VIEW COMPARISON:  12/22/2022. FINDINGS: Bilateral lung base opacities obscure the hemidiaphragms. Mid and upper lungs are clear. Cardiac silhouette is normal in size. No mediastinal or hilar masses. No pneumothorax.  Suspect small bilateral effusions. Stable changes from prior breast surgery. Skeletal structures are grossly intact. IMPRESSION: 1. Bilateral lung base opacities new since the prior chest radiographs, suspected to be a combination of atelectasis/pneumonia and small bilateral effusions. No evidence of pulmonary edema. Electronically Signed   By: Onalee Hua  Ormond M.D.   On: 11/14/2023 13:07   DG Ankle Complete Right Result Date: 11/13/2023 CLINICAL DATA:  Postop, surgery 10/12/2023 EXAM: RIGHT ANKLE - COMPLETE 3+ VIEW COMPARISON:  Preoperative imaging FINDINGS: Lateral plate and screw fixation traverse distal fibular fracture. Fracture is in anatomic alignment. Fracture lines remain visible. Two screws traverse the medial malleolar fracture. Fracture is in anatomic alignment. The fracture lines remain visible. No  periprosthetic lucencies. The ankle mortise is preserved. Diminishing soft tissue edema. IMPRESSION: ORIF of distal fibular and medial malleolar fractures. No hardware complication. Fracture lines remain visible. Electronically Signed   By: Narda Rutherford M.D.   On: 11/13/2023 17:06    Labs: BNP (last 3 results) Recent Labs    12/22/22 1300 11/14/23 1250 11/18/23 0446  BNP 49.0 891.7* 341.9*   Basic Metabolic Panel: Recent Labs  Lab 11/17/23 0408 11/17/23 0857 11/18/23 0446 11/19/23 0426 11/20/23 0353 11/21/23 0329  NA 139 139 138 137 138 136  K 3.4* 4.0 3.4* 3.2* 3.9 3.6  CL 96* 97* 97* 93* 94* 92*  CO2 30 33* 32 34* 31 34*  GLUCOSE 140* 133* 130* 102* 107* 106*  BUN 8 <5* 6* 9 14 14   CREATININE 0.71 0.68 0.80 0.90 0.89 0.92  CALCIUM 8.4* 8.5* 8.2* 8.8* 9.4 9.5  MG 2.3  --  1.8 1.8 1.8 1.8   Liver Function Tests: No results for input(s): "AST", "ALT", "ALKPHOS", "BILITOT", "PROT", "ALBUMIN" in the last 168 hours.  No results for input(s): "LIPASE", "AMYLASE" in the last 168 hours. No results for input(s): "AMMONIA" in the last 168 hours. CBC: Recent Labs  Lab 11/16/23 0631  WBC 7.9  HGB 10.1*  HCT 31.1*  MCV 88.1  PLT 708*   Cardiac Enzymes: No results for input(s): "CKTOTAL", "CKMB", "CKMBINDEX", "TROPONINI" in the last 168 hours. BNP: Invalid input(s): "POCBNP" CBG: No results for input(s): "GLUCAP" in the last 168 hours. D-Dimer No results for input(s): "DDIMER" in the last 72 hours. Hgb A1c No results for input(s): "HGBA1C" in the last 72 hours. Lipid Profile No results for input(s): "CHOL", "HDL", "LDLCALC", "TRIG", "CHOLHDL", "LDLDIRECT" in the last 72 hours. Thyroid function studies No results for input(s): "TSH", "T4TOTAL", "T3FREE", "THYROIDAB" in the last 72 hours.  Invalid input(s): "FREET3" Anemia work up No results for input(s): "VITAMINB12", "FOLATE", "FERRITIN", "TIBC", "IRON", "RETICCTPCT" in the last 72 hours. Urinalysis     Component Value Date/Time   COLORURINE STRAW (A) 11/17/2023 0310   APPEARANCEUR CLEAR 11/17/2023 0310   LABSPEC 1.004 (L) 11/17/2023 0310   PHURINE 6.0 11/17/2023 0310   GLUCOSEU NEGATIVE 11/17/2023 0310   GLUCOSEU NEGATIVE 08/12/2006 0922   HGBUR NEGATIVE 11/17/2023 0310   BILIRUBINUR NEGATIVE 11/17/2023 0310   BILIRUBINUR negative 09/09/2011 1535   KETONESUR NEGATIVE 11/17/2023 0310   PROTEINUR NEGATIVE 11/17/2023 0310   UROBILINOGEN 0.2 09/09/2011 1535   UROBILINOGEN 0.2 08/03/2008 1405   NITRITE NEGATIVE 11/17/2023 0310   LEUKOCYTESUR NEGATIVE 11/17/2023 0310   Sepsis Labs Recent Labs  Lab 11/16/23 0631  WBC 7.9   Microbiology Recent Results (from the past 240 hours)  Blood culture (routine x 2)     Status: None   Collection Time: 11/14/23  1:23 PM   Specimen: BLOOD RIGHT ARM  Result Value Ref Range Status   Specimen Description BLOOD RIGHT ARM  Final   Special Requests   Final    BOTTLES DRAWN AEROBIC AND ANAEROBIC Blood Culture adequate volume   Culture   Final    NO GROWTH 5  DAYS Performed at [facility], [address], [place], [place] [address]    Report Status 11/19/2023 FINAL  Final  Blood culture (routine x 2)     Status: None   Collection Time: 11/14/23  1:28 PM   Specimen: BLOOD  Result Value Ref Range Status   Specimen Description BLOOD RIGHT ANTECUBITAL  Final   Special Requests   Final    BOTTLES DRAWN AEROBIC AND ANAEROBIC Blood Culture adequate volume   Culture   Final    NO GROWTH 5 DAYS Performed at [facility], [address] [place], [place] [address]    Report Status 11/19/2023 FINAL  Final   Time coordinating discharge: 35 minutes  SIGNED: Lanae Boast, MD  [facility] 11/22/2023, 2:15 PM  If 7PM-7AM, please contact night-coverage [email or web address]

## 2023-11-20 NOTE — Progress Notes (Signed)
 Mobility Specialist Progress Note:    11/20/23 1133  Mobility  Activity Transferred from bed to chair  Level of Assistance Minimal assist, patient does 75% or more  Assistive Device Other (Comment) (HHA)  Distance Ambulated (ft) 6 ft  Activity Response Tolerated well  Mobility Referral Yes  Mobility visit 1 Mobility  Mobility Specialist Start Time (ACUTE ONLY) 1133  Mobility Specialist Stop Time (ACUTE ONLY) 1140  Mobility Specialist Time Calculation (min) (ACUTE ONLY) 7 min   Pt received in bed, agreeable to mobility session. Transferred pt B>C via HHA and MinA. Tolerated well, VSS throughout. Left pt in chair, alarm on, call bell in reach. All needs met.    Feliciana Rossetti Mobility Specialist Please contact via Special educational needs teacher or  Rehab office at [phone]

## 2023-11-21 DIAGNOSIS — I4719 Other supraventricular tachycardia: Secondary | ICD-10-CM | POA: Diagnosis not present

## 2023-11-21 LAB — MAGNESIUM: Magnesium: 1.8 mg/dL (ref 1.7–2.4)

## 2023-11-21 LAB — BASIC METABOLIC PANEL
Anion gap: 10 (ref 5–15)
BUN: 14 mg/dL (ref 8–23)
CO2: 34 mmol/L — ABNORMAL HIGH (ref 22–32)
Calcium: 9.5 mg/dL (ref 8.9–10.3)
Chloride: 92 mmol/L — ABNORMAL LOW (ref 98–111)
Creatinine, Ser: 0.92 mg/dL (ref 0.44–1.00)
GFR, Estimated: >60 mL/min (ref >60)
Glucose, Bld: 106 mg/dL — ABNORMAL HIGH (ref 70–99)
Potassium: 3.6 mmol/L (ref 3.5–5.1)
Sodium: 136 mmol/L (ref 135–145)

## 2023-11-21 NOTE — TOC Progression Note (Signed)
 Transition of Care [facility]) - Progression Note    Patient Details  Name: [name] MRN: [id number] Date of Birth: November 26, 1950  [facility]) CM/SW Contact  Patrice Paradise, LCSW Phone Number: 11/21/2023, 8:37 AM  Clinical Narrative:     Patient's authorization is still pending.  TOC team will continue to assist with discharge planning needs.    Expected Discharge Plan: Skilled Nursing Facility Barriers to Discharge: Continued Medical Work up  Expected Discharge Plan and Services   Discharge Planning Services: CM Consult   Living arrangements for the past 2 months: Single Family Home Expected Discharge Date: 11/20/23                         HH Arranged: RN, Disease Management, PT, OT, Nurse's Aide HH Agency: [facility] Date [facility] Agency Contacted: 11/15/23 Time HH Agency Contacted: [phone] Representative spoke with at [facility] Agency: Clifton Custard   Social Determinants of Health (SDOH) Interventions SDOH Screenings   Food Insecurity: Food Insecurity Present (11/14/2023)  Housing: Low Risk  (11/17/2023)  Recent Concern: Housing - High Risk (11/14/2023)  Transportation Needs: No Transportation Needs (11/17/2023)  Utilities: Not At Risk (11/14/2023)  Alcohol Screen: Low Risk  (11/17/2023)  Depression (PHQ2-9): Medium Risk (09/16/2023)  Financial Resource Strain: Low Risk  (11/17/2023)  Physical Activity: Inactive (05/11/2023)  Social Connections: Socially Isolated (11/14/2023)  Stress: No Stress Concern Present (05/11/2023)  Tobacco Use: Medium Risk (11/14/2023)  Health Literacy: Adequate Health Literacy (05/11/2023)    Readmission Risk Interventions     No data to display

## 2023-11-21 NOTE — Plan of Care (Signed)
  Problem: Education: Goal: Knowledge of General Education information will improve Description: Including pain rating scale, medication(s)/side effects and non-pharmacologic comfort measures Outcome: Progressing   Problem: Health Behavior/Discharge Planning: Goal: Ability to manage health-related needs will improve Outcome: Progressing   Problem: Clinical Measurements: Goal: Ability to maintain clinical measurements within normal limits will improve Outcome: Progressing Goal: Will remain free from infection Outcome: Progressing Goal: Diagnostic test results will improve Outcome: Progressing Goal: Respiratory complications will improve Outcome: Progressing Goal: Cardiovascular complication will be avoided Outcome: Progressing   Problem: Activity: Goal: Risk for activity intolerance will decrease Outcome: Progressing   Problem: Nutrition: Goal: Adequate nutrition will be maintained Outcome: Progressing   Problem: Coping: Goal: Level of anxiety will decrease Outcome: Progressing   Problem: Elimination: Goal: Will not experience complications related to bowel motility Outcome: Progressing Goal: Will not experience complications related to urinary retention Outcome: Progressing   Problem: Pain Managment: Goal: General experience of comfort will improve and/or be controlled Outcome: Progressing   Problem: Safety: Goal: Ability to remain free from injury will improve Outcome: Progressing   Problem: Skin Integrity: Goal: Risk for impaired skin integrity will decrease Outcome: Progressing   Problem: Education: Goal: Ability to demonstrate management of disease process will improve Outcome: Progressing Goal: Ability to verbalize understanding of medication therapies will improve Outcome: Progressing Goal: Individualized Educational Video(s) Outcome: Progressing   Problem: Activity: Goal: Capacity to carry out activities will improve Outcome: Progressing    Problem: Cardiac: Goal: Ability to achieve and maintain adequate cardiopulmonary perfusion will improve Outcome: Progressing   Problem: Education: Goal: Knowledge of disease or condition will improve Outcome: Progressing Goal: Understanding of medication regimen will improve Outcome: Progressing Goal: Individualized Educational Video(s) Outcome: Progressing   Problem: Activity: Goal: Ability to tolerate increased activity will improve Outcome: Progressing   Problem: Cardiac: Goal: Ability to achieve and maintain adequate cardiopulmonary perfusion will improve Outcome: Progressing   Problem: Health Behavior/Discharge Planning: Goal: Ability to safely manage health-related needs after discharge will improve Outcome: Progressing

## 2023-11-21 NOTE — Progress Notes (Signed)
 PROGRESS NOTE [name]  ZOX:[id number] DOB: 10-May-1951 DOA: 11/14/2023 PCP: Melida Quitter, PA  Brief Narrative/Hospital Course: 73 year old female with medical history significant for nonischemic cardiomyopathy, mitral regurgitation, SVT, GERD, hyperlipidemia, IBS, recent bimalleolar ankle fracture after fall presented from SNF for evaluation of increased heart rate, shortness of breath for 2 days.  Patient is noted to have atrial tachycardia, started on Cardizem drip admitted to [facility] service for further management. Cardiology consulted, started on amiodarone drip, diuretics for a flutter/A-fib with RVR MAT, acute on chronic HFrEF w/ pericardial effusion .Subsequently noted to be severe atrial functional MR-cardiology recommended structural team evaluation for mitraclip. At this time patient volume status optimized to transition to oral diuretic 3/21, planning for structural team evaluation for MitraClip 12/03/2023 outpatient visit.  Patient is medically stable to discharge to skilled nursing facility  Consultation: Cardiology   Subjective: Patient seen and examined this morning on the bedside chair denies any dysuria fever chills.  Has nausea which is somewhat chronic especially after taking pills  Overnight afebrile BP stable she is awaiting for skilled nursing facility  Assessment and Plan: Principal Problem:   Atrial tachycardia (HCC) Active Problems:   Hypokalemia   Mixed hyperlipidemia   COPD mixed type (HCC)   Protein-calorie malnutrition, severe (HCC)   Chronic respiratory failure with hypoxia (HCC)   Closed bimalleolar fracture of right ankle  Paroxysmal atrial flutter/A-fib with RVR  Multifocal Atrial tachycardia:  Hx of SVT, Zio in fall 2024 noted svt  no afib.  Workup with no PE on CTA Seen by cardiology s/p successful TEE cardioversion 3/18.  Continue Cardizem, Eliquis and amiodarone p.o. taper   Acute on Chronic HFrEF with recovered EF Severe MR H/o non  ischemic cardiomyopathy Left-sided pleural effusion Moderate pericardial effusion: NICM after treatment of breast cancer, chronic HFrEF with recovery with last EF of 60-65%. 3/18-TEE > EF 55-60%, severe MR>Per cardiology evaluating for MitraClip ahs fu on 4/425 as OP Diuresed well with Lasix, transitioned to p.o. 3/21. Continue to monitor weight regularly as outpatient and continue salt and fluid restriction   Hypokalemia: Hypomagnesemia: Electrolytes stable.     COPD on chronic oxygen Chronic respiratory failure with hypoxia on 3 L [place]: Stable on home o2,inhalers and PRN nebs.    Abdominal discomfort, nausea-appears chronic: Pyelonephritis on CT abdomen Urinalysis fairly unremarkable.started ciprofloxacin 400 mg bid> cont same to complete course. Norovirus negative. Continue PPI antiemetics.  Nausea is mostly to the pills. Recommend outpatient GI evaluation   Hyperlipidemia Cont statins.   Osteoporosis: S/p recent repair for bimalleolar ankle fracture after a fall cont ptot fall precaution, aspiration precaution   Severe malnutrition BMI 15.16.  Continue augment diet  DVT prophylaxis: Eliquis Code Status:   Code Status: Full Code Family Communication: plan of care discussed with patient at bedside. Patient status is: Remains hospitalized because of severity of illness Level of care: Progressive   Dispo: The patient is from: home alone            Anticipated disposition: Pending skilled nursing facility.    Objective: Vitals last 24 hrs: Vitals:   11/21/23 0804 11/21/23 0807 11/21/23 0828 11/21/23 1111  BP: (!) 103/59 (!) 103/59  107/64  Pulse: 79 79  80  Resp: 12 18  20   Temp:  98.2 F (36.8 C)  99.7 F (37.6 C)  TempSrc:  Oral  Oral  SpO2: 100% 100% 100% 100%  Weight:      Height:       Weight change: -1.5  kg  Physical Examination: General exam: alert awake, thin and frail comfortable.  On [place].   HEENT:Oral mucosa moist, Ear/Nose WNL grossly Respiratory  system: Bilaterally clear BS,no use of accessory muscle Cardiovascular system: S1 & S2 +, No JVD. Gastrointestinal system: Abdomen soft,NT,ND, BS+ Nervous System: Alert, awake, moving all extremities,and following commands. Extremities: LE edema neg,distal peripheral pulses palpable and warm.  Skin: No rashes,no icterus. MSK: Normal muscle bulk,tone, power   Medications reviewed:  Scheduled Meds:  amiodarone  400 mg Oral BID   Followed by   Melene Muller ON 11/25/2023] amiodarone  200 mg Oral Daily   apixaban  5 mg Oral BID   atorvastatin  40 mg Oral Daily   citalopram  20 mg Oral q AM   diltiazem  120 mg Oral Daily   docusate sodium  100 mg Oral Daily   furosemide  40 mg Oral Daily   magnesium oxide  400 mg Oral Daily   multivitamin with minerals  1 tablet Oral Daily   pantoprazole  40 mg Oral Daily   umeclidinium bromide  1 puff Inhalation Daily   Continuous Infusions:  ciprofloxacin 400 mg (11/21/23 0354)    Diet Order             Diet regular Room service appropriate? Yes with Assist; Fluid consistency: Thin  Diet effective now                  Intake/Output Summary (Last 24 hours) at 11/21/2023 1447 Last data filed at 11/21/2023 0300 Gross per 24 hour  Intake --  Output 180 ml  Net -180 ml   Net IO Since Admission: -1,774.76 mL [11/21/23 1447]  Wt Readings from Last 3 Encounters:  11/21/23 38.1 kg  10/12/23 41.7 kg  10/04/23 41.7 kg     Unresulted Labs (From admission, onward)     Start     Ordered   11/19/23 0500  Basic metabolic panel  Daily,   R     Question:  Specimen collection method  Answer:  Lab=Lab collect   11/18/23 1133   11/19/23 0500  Magnesium  Daily,   R     Question:  Specimen collection method  Answer:  Lab=Lab collect   11/18/23 1133          Data Reviewed: I have personally reviewed following labs and imaging studies CBC: Recent Labs  Lab 11/15/23 0120 11/15/23 0400 11/16/23 0631  WBC 10.2 9.0 7.9  HGB 9.3* 9.0* 10.1*  HCT 29.6*  28.5* 31.1*  MCV 92.2 91.1 88.1  PLT 475* 575* 708*   Basic Metabolic Panel:  Recent Labs  Lab 11/17/23 0408 11/17/23 0857 11/18/23 0446 11/19/23 0426 11/20/23 0353 11/21/23 0329  NA 139 139 138 137 138 136  K 3.4* 4.0 3.4* 3.2* 3.9 3.6  CL 96* 97* 97* 93* 94* 92*  CO2 30 33* 32 34* 31 34*  GLUCOSE 140* 133* 130* 102* 107* 106*  BUN 8 <5* 6* 9 14 14   CREATININE 0.71 0.68 0.80 0.90 0.89 0.92  CALCIUM 8.4* 8.5* 8.2* 8.8* 9.4 9.5  MG 2.3  --  1.8 1.8 1.8 1.8   GFR: Estimated Creatinine Clearance: 32.8 mL/min (by C-G formula based on SCr of 0.92 mg/dL). Liver Function Tests:  Recent Labs  Lab 11/15/23 0400  AST 65*  ALT 17  ALKPHOS 61  BILITOT 1.9*  PROT 6.7  ALBUMIN 3.1*   No results for input(s): "HGBA1C" in the last 72 hours. No results for input(s): "GLUCAP"  in the last 168 hours. No results for input(s): "CHOL", "HDL", "LDLCALC", "TRIG", "CHOLHDL", "LDLDIRECT" in the last 72 hours. Sepsis Labs: No results for input(s): "PROCALCITON", "LATICACIDVEN" in the last 168 hours. Recent Results (from the past 240 hours)  Blood culture (routine x 2)     Status: None   Collection Time: 11/14/23  1:23 PM   Specimen: BLOOD RIGHT ARM  Result Value Ref Range Status   Specimen Description BLOOD RIGHT ARM  Final   Special Requests   Final    BOTTLES DRAWN AEROBIC AND ANAEROBIC Blood Culture adequate volume   Culture   Final    NO GROWTH 5 DAYS Performed at [facility], [address], [place], [place] [address]    Report Status 11/19/2023 FINAL  Final  Blood culture (routine x 2)     Status: None   Collection Time: 11/14/23  1:28 PM   Specimen: BLOOD  Result Value Ref Range Status   Specimen Description BLOOD RIGHT ANTECUBITAL  Final   Special Requests   Final    BOTTLES DRAWN AEROBIC AND ANAEROBIC Blood Culture adequate volume   Culture   Final    NO GROWTH 5 DAYS Performed at [facility], [address] [place], [place] [address]    Report Status  11/19/2023 FINAL  Final    Antimicrobials/Microbiology: Anti-infectives (From admission, onward)    Start     Dose/Rate Route Frequency Ordered Stop   11/20/23 0000  ciprofloxacin (CIPRO) 500 MG tablet        500 mg Oral 2 times daily 11/20/23 1111 11/23/23 2359   11/16/23 1600  ciprofloxacin (CIPRO) IVPB 400 mg        400 mg 200 mL/hr over 60 Minutes Intravenous Every 12 hours 11/16/23 1558     11/14/23 1330  cefTRIAXone (ROCEPHIN) 2 g in sodium chloride 0.9 % 100 mL IVPB        2 g 200 mL/hr over 30 Minutes Intravenous  Once 11/14/23 1322 11/14/23 1525   11/14/23 1330  azithromycin (ZITHROMAX) 500 mg in sodium chloride 0.9 % 250 mL IVPB        500 mg 250 mL/hr over 60 Minutes Intravenous  Once 11/14/23 1322 11/14/23 1634         Component Value Date/Time   SDES BLOOD RIGHT ANTECUBITAL 11/14/2023 1328   SPECREQUEST  11/14/2023 1328    BOTTLES DRAWN AEROBIC AND ANAEROBIC Blood Culture adequate volume   CULT  11/14/2023 1328    NO GROWTH 5 DAYS Performed at [facility], [address] [place], [place] [address]    REPTSTATUS 11/19/2023 FINAL 11/14/2023 1328  Radiology Studies: No results found.  LOS: 7 days  Total time spent in review of labs and imaging, patient evaluation, formulation of plan, documentation and communication with family: 35 minutes Lanae Boast, MD [facility]  11/21/2023, 2:47 PM

## 2023-11-21 NOTE — TOC Progression Note (Signed)
 Transition of Care [facility]) - Progression Note    Patient Details  Name: KERRI KOVACIK MRN: [id number] Date of Birth: 1950/12/12  [facility]) CM/SW Contact  Patrice Paradise, LCSW Phone Number: 11/21/2023, 2:44 PM  Clinical Narrative:     Pt's Berkley Harvey is still pending.  TOC team will continue to assist with discharge planning needs.   Expected Discharge Plan: Skilled Nursing Facility Barriers to Discharge: Continued Medical Work up  Expected Discharge Plan and Services   Discharge Planning Services: CM Consult   Living arrangements for the past 2 months: Single Family Home Expected Discharge Date: 11/20/23                         HH Arranged: RN, Disease Management, PT, OT, Nurse's Aide HH Agency: [facility] Date [facility] Agency Contacted: 11/15/23 Time HH Agency Contacted: 1239 Representative spoke with at [facility] Agency: Clifton Custard   Social Determinants of Health (SDOH) Interventions SDOH Screenings   Food Insecurity: Food Insecurity Present (11/14/2023)  Housing: Low Risk  (11/17/2023)  Recent Concern: Housing - High Risk (11/14/2023)  Transportation Needs: No Transportation Needs (11/17/2023)  Utilities: Not At Risk (11/14/2023)  Alcohol Screen: Low Risk  (11/17/2023)  Depression (PHQ2-9): Medium Risk (09/16/2023)  Financial Resource Strain: Low Risk  (11/17/2023)  Physical Activity: Inactive (05/11/2023)  Social Connections: Socially Isolated (11/14/2023)  Stress: No Stress Concern Present (05/11/2023)  Tobacco Use: Medium Risk (11/14/2023)  Health Literacy: Adequate Health Literacy (05/11/2023)    Readmission Risk Interventions     No data to display

## 2023-11-21 NOTE — Progress Notes (Signed)
 Mobility Specialist Progress Note:   11/21/23 0923  Mobility  Activity Transferred from bed to chair  Level of Assistance Minimal assist, patient does 75% or more  Assistive Device Other (Comment) (HHA)  Distance Ambulated (ft) 3 ft  Activity Response Tolerated well  Mobility Referral Yes  Mobility visit 1 Mobility  Mobility Specialist Start Time (ACUTE ONLY) 0920  Mobility Specialist Stop Time (ACUTE ONLY) 0925  Mobility Specialist Time Calculation (min) (ACUTE ONLY) 5 min   Pt received in bed, agreeable to mobility session. Transferred B>C via HHA and MinA. Tolerated well, asx throughout. Left with all needs met. Chair alarm on and call bell in reach.    Feliciana Rossetti Mobility Specialist Please contact via Special educational needs teacher or  Rehab office at [phone]

## 2023-11-22 DIAGNOSIS — I5032 Chronic diastolic (congestive) heart failure: Secondary | ICD-10-CM | POA: Diagnosis present

## 2023-11-22 DIAGNOSIS — I5031 Acute diastolic (congestive) heart failure: Secondary | ICD-10-CM | POA: Diagnosis not present

## 2023-11-22 DIAGNOSIS — K7200 Acute and subacute hepatic failure without coma: Secondary | ICD-10-CM | POA: Diagnosis not present

## 2023-11-22 DIAGNOSIS — R2689 Other abnormalities of gait and mobility: Secondary | ICD-10-CM | POA: Diagnosis not present

## 2023-11-22 DIAGNOSIS — J849 Interstitial pulmonary disease, unspecified: Secondary | ICD-10-CM | POA: Diagnosis not present

## 2023-11-22 DIAGNOSIS — D649 Anemia, unspecified: Secondary | ICD-10-CM | POA: Diagnosis not present

## 2023-11-22 DIAGNOSIS — I499 Cardiac arrhythmia, unspecified: Secondary | ICD-10-CM | POA: Diagnosis not present

## 2023-11-22 DIAGNOSIS — I509 Heart failure, unspecified: Secondary | ICD-10-CM | POA: Diagnosis not present

## 2023-11-22 DIAGNOSIS — E872 Acidosis, unspecified: Secondary | ICD-10-CM | POA: Diagnosis present

## 2023-11-22 DIAGNOSIS — R64 Cachexia: Secondary | ICD-10-CM | POA: Diagnosis present

## 2023-11-22 DIAGNOSIS — I4891 Unspecified atrial fibrillation: Secondary | ICD-10-CM | POA: Diagnosis not present

## 2023-11-22 DIAGNOSIS — R0689 Other abnormalities of breathing: Secondary | ICD-10-CM | POA: Diagnosis not present

## 2023-11-22 DIAGNOSIS — Z9981 Dependence on supplemental oxygen: Secondary | ICD-10-CM | POA: Diagnosis not present

## 2023-11-22 DIAGNOSIS — Z789 Other specified health status: Secondary | ICD-10-CM | POA: Diagnosis not present

## 2023-11-22 DIAGNOSIS — R0602 Shortness of breath: Secondary | ICD-10-CM | POA: Diagnosis not present

## 2023-11-22 DIAGNOSIS — R079 Chest pain, unspecified: Secondary | ICD-10-CM | POA: Diagnosis not present

## 2023-11-22 DIAGNOSIS — J9622 Acute and chronic respiratory failure with hypercapnia: Secondary | ICD-10-CM | POA: Diagnosis not present

## 2023-11-22 DIAGNOSIS — R109 Unspecified abdominal pain: Secondary | ICD-10-CM | POA: Diagnosis not present

## 2023-11-22 DIAGNOSIS — I5023 Acute on chronic systolic (congestive) heart failure: Secondary | ICD-10-CM | POA: Diagnosis not present

## 2023-11-22 DIAGNOSIS — I471 Supraventricular tachycardia, unspecified: Secondary | ICD-10-CM | POA: Diagnosis not present

## 2023-11-22 DIAGNOSIS — E785 Hyperlipidemia, unspecified: Secondary | ICD-10-CM | POA: Diagnosis not present

## 2023-11-22 DIAGNOSIS — R1310 Dysphagia, unspecified: Secondary | ICD-10-CM | POA: Diagnosis not present

## 2023-11-22 DIAGNOSIS — R918 Other nonspecific abnormal finding of lung field: Secondary | ICD-10-CM | POA: Diagnosis not present

## 2023-11-22 DIAGNOSIS — M6281 Muscle weakness (generalized): Secondary | ICD-10-CM | POA: Diagnosis not present

## 2023-11-22 DIAGNOSIS — K219 Gastro-esophageal reflux disease without esophagitis: Secondary | ICD-10-CM | POA: Diagnosis not present

## 2023-11-22 DIAGNOSIS — Z515 Encounter for palliative care: Secondary | ICD-10-CM | POA: Diagnosis not present

## 2023-11-22 DIAGNOSIS — N179 Acute kidney failure, unspecified: Secondary | ICD-10-CM | POA: Diagnosis not present

## 2023-11-22 DIAGNOSIS — J449 Chronic obstructive pulmonary disease, unspecified: Secondary | ICD-10-CM | POA: Diagnosis not present

## 2023-11-22 DIAGNOSIS — M5030 Other cervical disc degeneration, unspecified cervical region: Secondary | ICD-10-CM | POA: Diagnosis not present

## 2023-11-22 DIAGNOSIS — S82841A Displaced bimalleolar fracture of right lower leg, initial encounter for closed fracture: Secondary | ICD-10-CM | POA: Diagnosis not present

## 2023-11-22 DIAGNOSIS — R627 Adult failure to thrive: Secondary | ICD-10-CM | POA: Diagnosis present

## 2023-11-22 DIAGNOSIS — E43 Unspecified severe protein-calorie malnutrition: Secondary | ICD-10-CM | POA: Diagnosis not present

## 2023-11-22 DIAGNOSIS — I110 Hypertensive heart disease with heart failure: Secondary | ICD-10-CM | POA: Diagnosis not present

## 2023-11-22 DIAGNOSIS — Z9181 History of falling: Secondary | ICD-10-CM | POA: Diagnosis not present

## 2023-11-22 DIAGNOSIS — I4719 Other supraventricular tachycardia: Secondary | ICD-10-CM | POA: Diagnosis not present

## 2023-11-22 DIAGNOSIS — E8729 Other acidosis: Secondary | ICD-10-CM | POA: Diagnosis not present

## 2023-11-22 DIAGNOSIS — F32A Depression, unspecified: Secondary | ICD-10-CM | POA: Diagnosis present

## 2023-11-22 DIAGNOSIS — Z7401 Bed confinement status: Secondary | ICD-10-CM | POA: Diagnosis not present

## 2023-11-22 DIAGNOSIS — R262 Difficulty in walking, not elsewhere classified: Secondary | ICD-10-CM | POA: Diagnosis not present

## 2023-11-22 DIAGNOSIS — I5084 End stage heart failure: Secondary | ICD-10-CM | POA: Diagnosis present

## 2023-11-22 DIAGNOSIS — I5043 Acute on chronic combined systolic (congestive) and diastolic (congestive) heart failure: Secondary | ICD-10-CM | POA: Diagnosis not present

## 2023-11-22 DIAGNOSIS — R578 Other shock: Secondary | ICD-10-CM | POA: Diagnosis not present

## 2023-11-22 DIAGNOSIS — S82401D Unspecified fracture of shaft of right fibula, subsequent encounter for closed fracture with routine healing: Secondary | ICD-10-CM | POA: Diagnosis not present

## 2023-11-22 DIAGNOSIS — S8251XD Displaced fracture of medial malleolus of right tibia, subsequent encounter for closed fracture with routine healing: Secondary | ICD-10-CM | POA: Diagnosis not present

## 2023-11-22 DIAGNOSIS — J9621 Acute and chronic respiratory failure with hypoxia: Secondary | ICD-10-CM | POA: Diagnosis not present

## 2023-11-22 DIAGNOSIS — I5033 Acute on chronic diastolic (congestive) heart failure: Secondary | ICD-10-CM | POA: Diagnosis present

## 2023-11-22 DIAGNOSIS — R110 Nausea: Secondary | ICD-10-CM | POA: Diagnosis present

## 2023-11-22 DIAGNOSIS — Z7901 Long term (current) use of anticoagulants: Secondary | ICD-10-CM | POA: Diagnosis present

## 2023-11-22 DIAGNOSIS — J9611 Chronic respiratory failure with hypoxia: Secondary | ICD-10-CM | POA: Diagnosis not present

## 2023-11-22 DIAGNOSIS — I517 Cardiomegaly: Secondary | ICD-10-CM | POA: Diagnosis not present

## 2023-11-22 DIAGNOSIS — D638 Anemia in other chronic diseases classified elsewhere: Secondary | ICD-10-CM | POA: Diagnosis present

## 2023-11-22 DIAGNOSIS — M4126 Other idiopathic scoliosis, lumbar region: Secondary | ICD-10-CM | POA: Diagnosis not present

## 2023-11-22 DIAGNOSIS — J439 Emphysema, unspecified: Secondary | ICD-10-CM | POA: Diagnosis present

## 2023-11-22 DIAGNOSIS — Z66 Do not resuscitate: Secondary | ICD-10-CM | POA: Diagnosis not present

## 2023-11-22 DIAGNOSIS — I77810 Thoracic aortic ectasia: Secondary | ICD-10-CM | POA: Diagnosis not present

## 2023-11-22 DIAGNOSIS — I4819 Other persistent atrial fibrillation: Secondary | ICD-10-CM | POA: Diagnosis not present

## 2023-11-22 DIAGNOSIS — Z743 Need for continuous supervision: Secondary | ICD-10-CM | POA: Diagnosis not present

## 2023-11-22 DIAGNOSIS — I428 Other cardiomyopathies: Secondary | ICD-10-CM | POA: Diagnosis not present

## 2023-11-22 DIAGNOSIS — D6869 Other thrombophilia: Secondary | ICD-10-CM | POA: Diagnosis not present

## 2023-11-22 DIAGNOSIS — I480 Paroxysmal atrial fibrillation: Secondary | ICD-10-CM | POA: Diagnosis not present

## 2023-11-22 DIAGNOSIS — R579 Shock, unspecified: Secondary | ICD-10-CM | POA: Diagnosis not present

## 2023-11-22 DIAGNOSIS — N170 Acute kidney failure with tubular necrosis: Secondary | ICD-10-CM | POA: Diagnosis not present

## 2023-11-22 DIAGNOSIS — R6889 Other general symptoms and signs: Secondary | ICD-10-CM | POA: Diagnosis not present

## 2023-11-22 DIAGNOSIS — Z853 Personal history of malignant neoplasm of breast: Secondary | ICD-10-CM | POA: Diagnosis not present

## 2023-11-22 DIAGNOSIS — J441 Chronic obstructive pulmonary disease with (acute) exacerbation: Secondary | ICD-10-CM | POA: Diagnosis not present

## 2023-11-22 DIAGNOSIS — M797 Fibromyalgia: Secondary | ICD-10-CM | POA: Diagnosis not present

## 2023-11-22 DIAGNOSIS — Z79899 Other long term (current) drug therapy: Secondary | ICD-10-CM | POA: Diagnosis not present

## 2023-11-22 DIAGNOSIS — I4892 Unspecified atrial flutter: Secondary | ICD-10-CM | POA: Diagnosis present

## 2023-11-22 DIAGNOSIS — R5381 Other malaise: Secondary | ICD-10-CM | POA: Diagnosis not present

## 2023-11-22 DIAGNOSIS — R0789 Other chest pain: Secondary | ICD-10-CM | POA: Diagnosis not present

## 2023-11-22 DIAGNOSIS — Z681 Body mass index (BMI) 19 or less, adult: Secondary | ICD-10-CM | POA: Diagnosis not present

## 2023-11-22 DIAGNOSIS — I340 Nonrheumatic mitral (valve) insufficiency: Secondary | ICD-10-CM | POA: Diagnosis not present

## 2023-11-22 DIAGNOSIS — I959 Hypotension, unspecified: Secondary | ICD-10-CM | POA: Diagnosis not present

## 2023-11-22 DIAGNOSIS — D72829 Elevated white blood cell count, unspecified: Secondary | ICD-10-CM | POA: Diagnosis not present

## 2023-11-22 DIAGNOSIS — R531 Weakness: Secondary | ICD-10-CM | POA: Diagnosis not present

## 2023-11-22 DIAGNOSIS — S82841D Displaced bimalleolar fracture of right lower leg, subsequent encounter for closed fracture with routine healing: Secondary | ICD-10-CM | POA: Diagnosis not present

## 2023-11-22 DIAGNOSIS — I3139 Other pericardial effusion (noninflammatory): Secondary | ICD-10-CM | POA: Diagnosis not present

## 2023-11-22 DIAGNOSIS — R570 Cardiogenic shock: Secondary | ICD-10-CM | POA: Diagnosis not present

## 2023-11-22 DIAGNOSIS — Z7189 Other specified counseling: Secondary | ICD-10-CM | POA: Diagnosis not present

## 2023-11-22 DIAGNOSIS — E876 Hypokalemia: Secondary | ICD-10-CM | POA: Diagnosis not present

## 2023-11-22 DIAGNOSIS — I2510 Atherosclerotic heart disease of native coronary artery without angina pectoris: Secondary | ICD-10-CM | POA: Diagnosis not present

## 2023-11-22 MED ORDER — AMIODARONE HCL 200 MG PO TABS
ORAL_TABLET | ORAL | Status: DC
Start: 2023-11-22 — End: 2023-12-25

## 2023-11-22 NOTE — Progress Notes (Signed)
 Mobility Specialist Progress Note;   11/22/23 1430  Mobility  Activity Transferred to/from [facility];Transferred from chair to bed  Level of Assistance Contact guard assist, steadying assist  Assistive Device Other (Comment) (HHA)  Distance Ambulated (ft) 8 ft  Activity Response Tolerated well  Mobility Referral Yes  Mobility visit 1 Mobility  Mobility Specialist Start Time (ACUTE ONLY) 1430  Mobility Specialist Stop Time (ACUTE ONLY) 1440  Mobility Specialist Time Calculation (min) (ACUTE ONLY) 10 min   Pt requesting assistance to [facility]. Required MinA via HHA to safely transfer pt to and from [facility], small BM successful. VSS throughout. Pt requested assistance back to bed once finished. Pt left comfortably in bed with all needs met.   Caesar Bookman Mobility Specialist Please contact via SecureChat or [employer] [phone]

## 2023-11-22 NOTE — Progress Notes (Signed)
 Physical Therapy Treatment Patient Details Name: [name] MRN: [id number] DOB: [birth year] Today's Date: 11/22/2023   History of Present Illness [name] is a 73 y.o. female admitted 11/14/23 with SOB and found to have atrial tachycardia r/o Afib with RVR vs Multifocal Atrial tachycardia acute on chronic CHF. 3/18 to have TEE and potential cardioversion this day. PHMx: NICM following treatment of breast cancer,  EF of 60-65%, COPD on 3L O2, fibromyalgia, chronic SI joint pain, mitral regurgitaiton, SVT , GERD, HLD, IBS, osteoporosis, recent concussion and bimalleolar ankle fracture s/p ORIF 2/11 and to SNF until 3/13.    PT Comments  Pt laying in bed waiting for RN to return with clean gown.  PT assisted with donning fresh gown and managing wires. Pt reports she just got up to go to the bathroom and is feeling better as she has been able to walk in to the bathroom 3 times in the last 2 days. However, pt reports she is very tired after doing it. Pt is agreeable to getting up to the recliner. Pt is contact guard for bed mobility and min A for stand step transfer to recliner. Pt for discharge this afternoon. PT will continue to follow acutely while she is here.   If plan is discharge home, recommend the following: A lot of help with walking and/or transfers;A lot of help with bathing/dressing/bathroom;Help with stairs or ramp for entrance;Assist for transportation;Assistance with cooking/housework   Can travel by private vehicle     Yes  Equipment Recommendations  None recommended by PT       Precautions / Restrictions Precautions Precautions: Fall Recall of Precautions/Restrictions: Impaired Restrictions Weight Bearing Restrictions Per Provider Order: No Other Position/Activity Restrictions: Pt reports when she went back to ortho doc while still at SNF that he referred her back to the PT at the facility to use CAM BOOT or not--pt reports she has not been wearing CAM BOOT since  that day.     Mobility  Bed Mobility Overal bed mobility: Needs Assistance Bed Mobility: Supine to Sit     Supine to sit: Contact guard, HOB elevated, Used rails     General bed mobility comments: increased time and effort but pt was able to get to EoB without outside physical assist    Transfers Overall transfer level: Needs assistance Equipment used: 1 person hand held assist Transfers: Bed to chair/wheelchair/BSC   Stand pivot transfers: Min assist         General transfer comment: min A for power up and steadying wth stepping pivot to recliner, vc for    Ambulation/Gait               General Gait Details: pt had just been up to bathroom minutes prior to PT arrival and pt reports being very tired so declined further ambulation today       Balance Overall balance assessment: Needs assistance Sitting-balance support: No upper extremity supported, Feet supported Sitting balance-Leahy Scale: Fair Sitting balance - Comments: Pt sat EOB with supervision.   Standing balance support: During functional activity, Single extremity supported Standing balance-Leahy Scale: Poor Standing balance comment: Pt dependent on PT for support during transfer. Unsteady, but no overt LOB.                            Communication Communication Communication: No apparent difficulties Factors Affecting Communication: Hearing impaired  Cognition Arousal: Alert Behavior During Therapy: Flat affect  PT - Cognitive impairments: No apparent impairments                       PT - Cognition Comments: Pt A,Ox4 Following commands: Intact      Cueing Cueing Techniques: Verbal cues, Gestural cues, Tactile cues     General Comments General comments (skin integrity, edema, etc.): VSS on 3L O2 via [place], assisted pt in ordering her lunch      Pertinent Vitals/Pain Pain Assessment Pain Assessment: No/denies pain     PT Goals (current goals can now be found in  the care plan section) Acute Rehab PT Goals Patient Stated Goal: Feel better PT Goal Formulation: With patient Time For Goal Achievement: 11/30/23 Potential to Achieve Goals: Good Progress towards PT goals: Progressing toward goals    Frequency    Min 2X/week       AM-PAC PT "6 Clicks" Mobility   Outcome Measure  Help needed turning from your back to your side while in a flat bed without using bedrails?: A Little Help needed moving from lying on your back to sitting on the side of a flat bed without using bedrails?: A Little Help needed moving to and from a bed to a chair (including a wheelchair)?: A Little Help needed standing up from a chair using your arms (e.g., wheelchair or bedside chair)?: A Little Help needed to walk in hospital room?: A Lot Help needed climbing 3-5 steps with a railing? : A Lot 6 Click Score: 16    End of Session Equipment Utilized During Treatment: Gait belt;Oxygen Activity Tolerance: Patient limited by fatigue Patient left: in chair;with call bell/phone within reach;with chair alarm set Nurse Communication: Mobility status;Other (comment) PT Visit Diagnosis: History of falling (Z91.81);Other abnormalities of gait and mobility (R26.89);Difficulty in walking, not elsewhere classified (R26.2);Pain Pain - part of body:  (generalized soreness)     Time: 1610-9604 PT Time Calculation (min) (ACUTE ONLY): 22 min  Charges:    $Therapeutic Activity: 8-22 mins PT General Charges $$ ACUTE PT VISIT: 1 Visit                     Nhan Qualley B. Beverely Risen PT, DPT Acute Rehabilitation Services Please use secure chat or  Call Office [phone]    Elon Alas Fleet 11/22/2023, 1:28 PM

## 2023-11-22 NOTE — Care Management Important Message (Signed)
 Important Message  Patient Details  Name: [name] MRN: [id number] Date of Birth: Sep 28, 1950   Important Message Given:  Yes - Medicare IM     Renie Ora 11/22/2023, 12:06 PM

## 2023-11-22 NOTE — Progress Notes (Signed)
 Report called to [employer] and [employer]. Awaiting PTAR for transport to facility.

## 2023-11-22 NOTE — TOC Transition Note (Signed)
 Transition of Care [facility]) - Discharge Note   Patient Details  Name: [name] MRN: [id number] Date of Birth: 1950-11-07  Transition of Care [facility]) CM/SW Contact:  Delilah Shan, LCSWA Phone Number: 11/22/2023, 2:25 PM   Clinical Narrative:     Patient will DC to: [facility]  Anticipated DC date: 11/22/2023  Family notified: [name]   Transport by: Sharin Mons  ?  Per MD patient ready for DC to [facility] . RN, patient, patient's family, and facility notified of DC. Discharge Summary sent to facility. RN given number for report tele#[phone] RM# [phone] . DC packet on chart. Ambulance transport requested for patient.  CSW signing off.   Final next level of care: Skilled Nursing Facility Barriers to Discharge: No Barriers Identified   Patient Goals and CMS Choice Patient states their goals for this hospitalization and ongoing recovery are:: SNF CMS Medicare.gov Compare Post Acute Care list provided to:: Patient Choice offered to / list presented to : Patient      Discharge Placement              Patient chooses bed at: [facility] Patient to be transferred to facility by: PTAR Name of family member notified: Heather Patient and family notified of of transfer: 11/22/23  Discharge Plan and Services Additional resources added to the After Visit Summary for     Discharge Planning Services: CM Consult                      HH Arranged: RN, Disease Management, PT, OT, Nurse's Aide HH Agency: [facility] Date [facility] Agency Contacted: 11/15/23 Time HH Agency Contacted: [phone] Representative spoke with at [facility] Agency: Clifton Custard  Social Drivers of Health (SDOH) Interventions SDOH Screenings   Food Insecurity: Food Insecurity Present (11/14/2023)  Housing: Low Risk  (11/17/2023)  Recent Concern: Housing - High Risk (11/14/2023)  Transportation Needs: No Transportation Needs (11/17/2023)  Utilities: Not At Risk (11/14/2023)  Alcohol Screen: Low Risk   (11/17/2023)  Depression (PHQ2-9): Medium Risk (09/16/2023)  Financial Resource Strain: Low Risk  (11/17/2023)  Physical Activity: Inactive (05/11/2023)  Social Connections: Socially Isolated (11/14/2023)  Stress: No Stress Concern Present (05/11/2023)  Tobacco Use: Medium Risk (11/14/2023)  Health Literacy: Adequate Health Literacy (05/11/2023)     Readmission Risk Interventions     No data to display

## 2023-11-22 NOTE — Progress Notes (Signed)
   Heart Failure Stewardship Pharmacist Progress Note   PCP: Melida Quitter, PA PCP-Cardiologist: Rollene Rotunda, MD    HPI:  73 y.o. female with PMHx of GAD, osteoporosis, fibromyalgia, IBS, DDD, HLD, COPD, and ILD on 3L of home O2.  She arrived to [facility] ED on 03/16 with complaints of palpitations. She noticed this symptom over the last few days. She also reported SOB and a dry cough. She appeared to be in A-fib with RVR. HR was 150-160s with EMS. Tachycardia persisted despite diltiazem 10 mg twice. CXR on 03/16 showed bilateral lung base opacities (new), suspected combination of atelectasis/pneumonia and small bilateral effusions. No evidence of pulmonary edema. Chest CT on 03/16 showed cardiomegaly with small pericardial effusion. ECHO on 03/17 revealed LVEF of 60-65% (unchanged from 11/2022), no regional wall motion abnormalities, normal PA pressure, moderate pericardial effusion posterior to left ventricular, no evidence of cardiac tamponade, abnormal MV, severe MV regurgitation, and right atrial pressure of 15 mmHg. ECHO TEE on 03/18 revealed LVEF 55-60%, no L/R atrial appendage thrombus, abnormal MV and severe MV regurgitation.  Amiodarone drip now stopped. Transitioned to PO.   Today she states she is doing well. Her breathing has improved. She denied SOB and DOE. No bilateral lower extremity edema seen on exam. Peer to peer approved for SNF d/c.  Current HF Medications: Diuretic: Furosemide 40 mg po QD  Prior to admission HF Medications: Beta blocker: Metoprolol tartrate 25 mg every day (last dispensed in March for 30 ds)  Pertinent Lab Values: As of 11/21/2023: Serum creatinine 0.92 (<0.89), BUN 14 (<14< 9), Potassium 3.6(<3.9), Sodium 136(<138), BNP 341.9, Magnesium 1.8(<1.8), A1c 5.1  Vital Signs: Weight: 83.8 lbs (admission weight: 91.9 lbs) Blood pressure: 108/50 Heart rate: 77 I/O: net +0.20 L yesterday; net -1.5 L since admission  Medication  Assistance / Insurance Benefits Check: Does the patient have prescription insurance?  Yes Type of insurance plan: [facility] Medicare  Outpatient Pharmacy:  Prior to admission outpatient pharmacy:  [id number] - [place], [place] - [address]  Is the patient willing to use [facility] TOC pharmacy at discharge? Yes Is the patient willing to transition their outpatient pharmacy to utilize a [facility] outpatient pharmacy?   No    Assessment: 1. Acute on chronic HFpEF (LVEF 60-65%). NYHA class I symptoms. - Strict I/Os and daily weights. Keep K>4, Mg>2 -Continue Furosemide 40 mg every day at discharge -Consider initiation of SGLT2i pending resolution of pyelonephritis and MRA at follow up    Plan: 1) Medication changes recommended at this time: -Consider initiation of SGLT2i pending resolution of pyelonephritis and MRA at follow up   2) Patient assistance: -Ran test claim for Farxiga 10 mg and the current 30 day co-pay is $47.00. -Ran test claim for Jardiance 10 mg and the current 30 day co-pay is $47.00. -Is interested in grant program to assist with access  3)  Education  - Patient has been educated on current HF medications and potential additions to HF medication regimen - Patient verbalizes understanding that over the next few months, these medication doses may change and more medications may be added to optimize HF regimen - Patient has been educated on basic disease state pathophysiology and goals of therapy   Sofie Rower, PharmD [employer] PGY-1

## 2023-11-22 NOTE — TOC Progression Note (Addendum)
 Transition of Care [facility]) - Progression Note    Patient Details  Name: [name] MRN: [id number] Date of Birth: 09-05-1950  Transition of Care [facility]) CM/SW Contact  Delilah Shan, LCSWA Phone Number: 11/22/2023, 10:19 AM  Clinical Narrative:     Patients insurance authorization currently pending. Patient has SN[facility]. CSW will continue to follow and assist with patients dc planning needs.  Update- CSW called patients insurance to see if there is anything else they need from CSW for authorization determination. Insurance informed CSW that patients insurance authorization went to a peer to peer. Deadline is today by 3pm. CSW informed MD. Tele# [phone] option 5. CSW will continue to follow.  Update- Peer to peer was approved. Plan Auth ID# [id number] Auth ID # [id number]. Insurance authorization approved from 3/24-3/26. MD informed CSW that patient medically stable for DC.CSW awaiting to hear back from [facility] to see if they can accept patient today.  Expected Discharge Plan: Skilled Nursing Facility Barriers to Discharge: Continued Medical Work up  Expected Discharge Plan and Services   Discharge Planning Services: CM Consult   Living arrangements for the past 2 months: Single Family Home Expected Discharge Date: 11/20/23                         HH Arranged: RN, Disease Management, PT, OT, Nurse's Aide HH Agency: [facility] Date [facility] Agency Contacted: 11/15/23 Time HH Agency Contacted: [phone] Representative spoke with at [facility] Agency: Clifton Custard   Social Determinants of Health (SDOH) Interventions SDOH Screenings   Food Insecurity: Food Insecurity Present (11/14/2023)  Housing: Low Risk  (11/17/2023)  Recent Concern: Housing - High Risk (11/14/2023)  Transportation Needs: No Transportation Needs (11/17/2023)  Utilities: Not At Risk (11/14/2023)  Alcohol Screen: Low Risk  (11/17/2023)  Depression (PHQ2-9): Medium Risk (09/16/2023)  Financial  Resource Strain: Low Risk  (11/17/2023)  Physical Activity: Inactive (05/11/2023)  Social Connections: Socially Isolated (11/14/2023)  Stress: No Stress Concern Present (05/11/2023)  Tobacco Use: Medium Risk (11/14/2023)  Health Literacy: Adequate Health Literacy (05/11/2023)    Readmission Risk Interventions     No data to display

## 2023-11-23 ENCOUNTER — Telehealth: Payer: Self-pay

## 2023-11-23 ENCOUNTER — Telehealth (HOSPITAL_COMMUNITY): Payer: Self-pay

## 2023-11-23 NOTE — Telephone Encounter (Signed)
 Per Dr. Izora Ribas, called to confirm Structural Heart consult with Dr. Lynnette Caffey.  Left message to call back.

## 2023-11-23 NOTE — Telephone Encounter (Signed)
 Called to confirm/remind patient of their appointment at [facility] on 11/24/2026 2:45.   Appointment:   [] Confirmed  [x] Left mess   [] No answer/No voice mail  [] Phone not in service  Patient reminded to bring all medications and/or complete list.  Confirmed patient has transportation. Gave directions, instructed to utilize valet parking.

## 2023-11-24 ENCOUNTER — Encounter (HOSPITAL_COMMUNITY): Payer: Self-pay

## 2023-11-24 ENCOUNTER — Ambulatory Visit (HOSPITAL_COMMUNITY)
Admit: 2023-11-24 | Discharge: 2023-11-24 | Disposition: A | Discharge status: Home / Self Care | Source: Ambulatory Visit | Attending: Cardiology | Admitting: Cardiology

## 2023-11-24 ENCOUNTER — Ambulatory Visit (HOSPITAL_BASED_OUTPATIENT_CLINIC_OR_DEPARTMENT_OTHER)

## 2023-11-24 ENCOUNTER — Encounter (HOSPITAL_BASED_OUTPATIENT_CLINIC_OR_DEPARTMENT_OTHER): Payer: Self-pay | Admitting: Orthopaedic Surgery

## 2023-11-24 ENCOUNTER — Ambulatory Visit (INDEPENDENT_AMBULATORY_CARE_PROVIDER_SITE_OTHER): Payer: Medicare Other | Admitting: Orthopaedic Surgery

## 2023-11-24 VITALS — BP 100/56 | HR 73 | Ht 60.0 in | Wt 84.6 lb

## 2023-11-24 DIAGNOSIS — I480 Paroxysmal atrial fibrillation: Secondary | ICD-10-CM | POA: Insufficient documentation

## 2023-11-24 DIAGNOSIS — I471 Supraventricular tachycardia, unspecified: Secondary | ICD-10-CM | POA: Insufficient documentation

## 2023-11-24 DIAGNOSIS — R262 Difficulty in walking, not elsewhere classified: Secondary | ICD-10-CM | POA: Diagnosis not present

## 2023-11-24 DIAGNOSIS — J449 Chronic obstructive pulmonary disease, unspecified: Secondary | ICD-10-CM | POA: Diagnosis not present

## 2023-11-24 DIAGNOSIS — I340 Nonrheumatic mitral (valve) insufficiency: Secondary | ICD-10-CM | POA: Diagnosis not present

## 2023-11-24 DIAGNOSIS — Z9181 History of falling: Secondary | ICD-10-CM | POA: Diagnosis not present

## 2023-11-24 DIAGNOSIS — E785 Hyperlipidemia, unspecified: Secondary | ICD-10-CM | POA: Diagnosis not present

## 2023-11-24 DIAGNOSIS — Z9981 Dependence on supplemental oxygen: Secondary | ICD-10-CM | POA: Insufficient documentation

## 2023-11-24 DIAGNOSIS — R5381 Other malaise: Secondary | ICD-10-CM | POA: Diagnosis not present

## 2023-11-24 DIAGNOSIS — K219 Gastro-esophageal reflux disease without esophagitis: Secondary | ICD-10-CM | POA: Diagnosis not present

## 2023-11-24 DIAGNOSIS — E43 Unspecified severe protein-calorie malnutrition: Secondary | ICD-10-CM | POA: Diagnosis not present

## 2023-11-24 DIAGNOSIS — Z7901 Long term (current) use of anticoagulants: Secondary | ICD-10-CM | POA: Diagnosis present

## 2023-11-24 DIAGNOSIS — I5031 Acute diastolic (congestive) heart failure: Secondary | ICD-10-CM

## 2023-11-24 DIAGNOSIS — I428 Other cardiomyopathies: Secondary | ICD-10-CM | POA: Diagnosis not present

## 2023-11-24 DIAGNOSIS — S82841A Displaced bimalleolar fracture of right lower leg, initial encounter for closed fracture: Secondary | ICD-10-CM

## 2023-11-24 DIAGNOSIS — R110 Nausea: Secondary | ICD-10-CM | POA: Diagnosis present

## 2023-11-24 DIAGNOSIS — S82401D Unspecified fracture of shaft of right fibula, subsequent encounter for closed fracture with routine healing: Secondary | ICD-10-CM | POA: Diagnosis not present

## 2023-11-24 DIAGNOSIS — S82841D Displaced bimalleolar fracture of right lower leg, subsequent encounter for closed fracture with routine healing: Secondary | ICD-10-CM | POA: Diagnosis not present

## 2023-11-24 DIAGNOSIS — Z853 Personal history of malignant neoplasm of breast: Secondary | ICD-10-CM | POA: Insufficient documentation

## 2023-11-24 DIAGNOSIS — Z681 Body mass index (BMI) 19 or less, adult: Secondary | ICD-10-CM | POA: Insufficient documentation

## 2023-11-24 DIAGNOSIS — I5032 Chronic diastolic (congestive) heart failure: Secondary | ICD-10-CM | POA: Insufficient documentation

## 2023-11-24 DIAGNOSIS — J9611 Chronic respiratory failure with hypoxia: Secondary | ICD-10-CM | POA: Diagnosis not present

## 2023-11-24 DIAGNOSIS — S8251XD Displaced fracture of medial malleolus of right tibia, subsequent encounter for closed fracture with routine healing: Secondary | ICD-10-CM | POA: Diagnosis not present

## 2023-11-24 DIAGNOSIS — M797 Fibromyalgia: Secondary | ICD-10-CM | POA: Insufficient documentation

## 2023-11-24 DIAGNOSIS — M6281 Muscle weakness (generalized): Secondary | ICD-10-CM | POA: Diagnosis not present

## 2023-11-24 LAB — BASIC METABOLIC PANEL
Anion gap: 9 (ref 5–15)
BUN: 24 mg/dL — ABNORMAL HIGH (ref 8–23)
CO2: 35 mmol/L — ABNORMAL HIGH (ref 22–32)
Calcium: 9 mg/dL (ref 8.9–10.3)
Chloride: 97 mmol/L — ABNORMAL LOW (ref 98–111)
Creatinine, Ser: 0.95 mg/dL (ref 0.44–1.00)
GFR, Estimated: >60 mL/min (ref >60)
Glucose, Bld: 98 mg/dL (ref 70–99)
Potassium: 3.3 mmol/L — ABNORMAL LOW (ref 3.5–5.1)
Sodium: 141 mmol/L (ref 135–145)

## 2023-11-24 MED ORDER — AMIODARONE HCL 200 MG PO TABS: 200.0000 mg | ORAL_TABLET | Freq: Every day | ORAL | 3 refills | Status: AC

## 2023-11-24 NOTE — Progress Notes (Signed)
 HEART & VASCULAR TRANSITION OF CARE CONSULT NOTE     Referring Physician: Dr. Anne Fu PCP: Melida Quitter, PA  Primary Cardiologist: Dr. Antoine Poche   Chief Complaint: Hospital F/u for HFpEF   HPI: Referred to clinic by Dr. Anne Fu for heart failure consultation.   73 y.o. female with PMH of NICM in 2010 after breast CA treatment with improved EF, PAF, SVT, HLD, COPD on chronic home O2, fibromyalgia, GERD and breast CA who was recently admitted w/ acute CHF and afib w/ RVR. She was diuresed w/ IV Lasix and placed on Cardizem for rate control. Echo w/ normal LVEF 60-65%, RV nl, severe BAE, mod pericardial effusion and severe MR. TEE also was done and confirmed severe MR. No LAA. She then underwent successful DCCV back to NSR. Amiodarone was added for rhythm control. She was transitioned to PO diuretics. She has been referred to [facility] for consideration for mTEER and to [facility] clinic for hospital f/u. D/c Wt 87 lb. Discharged to SNF for rehab.   She presents today for assessment. In WC. Very frail. Wt down 3L since d/c. No leg edema. She is on chronic home O2, 3L/min, which has been her baseline. Denies dyspnea. Has not had to increase O2 requirements. Her main complaint is nausea which may be due to the amiodarone. No vomiting.   EKG is reading "atrial flutter" but on my review appears NSR w/ notched p-wave c/w LAE. HR 72 bpm. Suspect computer interpreting t wave and notched p as flutter waves. She denies palpitations. He is on Eliquis for a/c.   She has an appt w/ Dr. Excell Seltzer next wk.    Cardiac Testing   Echo 11/15/2023 1. Left ventricular ejection fraction, by estimation, is 60 to 65%. The  left ventricle has normal function. The left ventricle has no regional  wall motion abnormalities. Left ventricular diastolic function could not  be evaluated.   2. Right ventricular systolic function is normal. The right ventricular  size is normal. There is normal pulmonary  artery systolic pressure. The  estimated right ventricular systolic pressure is 35.8 mmHg.   3. Left atrial size was severely dilated.   4. Right atrial size was severely dilated.   5. Moderate pericardial effusion. The pericardial effusion is posterior  to the left ventricle. There is no evidence of cardiac tamponade.   6. The mitral valve is abnormal. Severe mitral valve regurgitation. No  evidence of mitral stenosis.   7. The aortic valve is tricuspid. There is mild calcification of the  aortic valve. Aortic valve regurgitation is not visualized. Aortic valve  sclerosis/calcification is present, without any evidence of aortic  stenosis.   8. Aortic dilatation noted. There is mild dilatation of the ascending  aorta, measuring 41 mm.   9. The inferior vena cava is dilated in size with <50% respiratory  variability, suggesting right atrial pressure of 15 mmHg.        TEE 11/16/2023 1. Left ventricular ejection fraction, by estimation, is 55 to 60%. The  left ventricle has normal function.   2. Right ventricular systolic function is normal. The right ventricular  size is normal.   3. Left atrial size was mildly dilated. No left atrial/left atrial  appendage thrombus was detected.   4. The mitral valve is abnormal. Severe mitral valve regurgitation. No  evidence of mitral stenosis.   5. The tricuspid valve is abnormal. Tricuspid valve regurgitation is mild  to moderate.   6. The aortic  valve is tricuspid. Aortic valve regurgitation is trivial.  No aortic stenosis is present.   7. There is borderline dilatation of the ascending aorta, measuring 36  mm.    Past Medical History:  Diagnosis Date   Anemia    B12 deficient   Arthritis    B12 deficiency    Breast cancer (HCC)    left breast.   Chronic SI joint pain    Bilateral, Dr. Corliss Skains   COPD (chronic obstructive pulmonary disease) (HCC)    Fibromyalgia    Dr. Corliss Skains   GERD (gastroesophageal reflux disease)     Greater trochanteric bursitis of both hips    Dr. Corliss Skains   Hyperlipidemia    IBS (irritable bowel syndrome)    LV dysfunction    iatrogenic from chemotherapy ; on Carvedilol    Migraines    Osteoporosis    Dr Lomax---> transferring to a new gyn   Oxygen dependent    3 liters    Current Outpatient Medications  Medication Sig Dispense Refill   acetaminophen (TYLENOL) 500 MG tablet Take 1,000 mg by mouth every 6 (six) hours as needed for moderate pain (pain score 4-6) or mild pain (pain score 1-3).     albuterol (VENTOLIN HFA) 108 (90 Base) MCG/ACT inhaler Inhale 2 puffs every 6 hours as needed 8 g 12   apixaban (ELIQUIS) 5 MG TABS tablet Take 1 tablet (5 mg total) by mouth 2 (two) times daily.     atorvastatin (LIPITOR) 40 MG tablet Take 1 tablet (40 mg total) by mouth daily.     Calcium Carbonate-Vit D-Min (CALCIUM 600+D PLUS MINERALS) 600-400 MG-UNIT TABS Take 1 tablet by mouth in the morning.     citalopram (CELEXA) 20 MG tablet Take 1 tablet (20 mg total) by mouth in the morning.     diltiazem (CARDIZEM CD) 120 MG 24 hr capsule Take 1 capsule (120 mg total) by mouth daily. 90 capsule 3   docusate sodium (COLACE) 100 MG capsule Take 100 mg by mouth daily.     furosemide (LASIX) 40 MG tablet Take 1 tablet (40 mg total) by mouth daily.     hydrOXYzine (ATARAX/VISTARIL) 10 MG tablet Take 10 mg by mouth every 6 (six) hours as needed for anxiety.     magnesium oxide (MAG-OX) 400 (240 Mg) MG tablet Take 400 mg by mouth daily.     methocarbamol (ROBAXIN) 750 MG tablet TAKE 1 TABLET BY MOUTH 2 TIMES DAILY AS NEEDED FOR MUSCLE SPASMS (NON FORMULARY) 60 tablet 2   Multiple Vitamins-Minerals (MULTIVITAMIN WITH MINERALS) tablet Take 1 tablet by mouth daily.     ondansetron (ZOFRAN-ODT) 4 MG disintegrating tablet Take 4 mg by mouth every 8 (eight) hours as needed.     oxyCODONE (ROXICODONE) 5 MG immediate release tablet Take 1 tablet (5 mg total) by mouth every 4 (four) hours as needed for up to  5 doses for severe pain (pain score 7-10) or breakthrough pain. 3 tablet 0   pantoprazole (PROTONIX) 20 MG tablet Take 20 mg by mouth daily.     umeclidinium bromide (INCRUSE ELLIPTA) 62.5 MCG/ACT AEPB Inhale 1 puff into the lungs daily. 30 each 12   amiodarone (PACERONE) 200 MG tablet Take 1 tablet (200 mg total) by mouth daily. 30 tablet 3   No current facility-administered medications for this encounter.    Allergies  Allergen Reactions   Amoxicillin-Pot Clavulanate     diarrhea   Doxycycline     Nausea & vomiting  Venlafaxine     ? Reaction; ? Blurred vision      Social History   Socioeconomic History   Marital status: Widowed    Spouse name: Not on file   Number of children: 1   Years of education: Not on file   Highest education level: 12th grade  Occupational History   Occupation: retired, Warehouse manager   Tobacco Use   Smoking status: Former    Current packs/day: 0.00    Average packs/day: 1 pack/day for 30.0 years (30.0 ttl pk-yrs)    Types: Cigarettes    Start date: 08/31/1960    Quit date: 08/31/1990    Years since quitting: 33.2    Passive exposure: Never   Smokeless tobacco: Never  Vaping Use   Vaping status: Never Used  Substance and Sexual Activity   Alcohol use: No   Drug use: Not Currently    Types: Marijuana    Comment: 08/01/2017 last used    Sexual activity: Not Currently  Other Topics Concern   Not on file  Social History Narrative   Lives w/ husband   Social Drivers of Health   Financial Resource Strain: Low Risk  (11/17/2023)   Overall Financial Resource Strain (CARDIA)    Difficulty of Paying Living Expenses: Not hard at all  Food Insecurity: Food Insecurity Present (11/14/2023)   Hunger Vital Sign    Worried About Running Out of Food in the Last Year: Sometimes true    Ran Out of Food in the Last Year: Sometimes true  Transportation Needs: No Transportation Needs (11/17/2023)   PRAPARE - Administrator, Civil Service (Medical): No     Lack of Transportation (Non-Medical): No  Physical Activity: Inactive (05/11/2023)   Exercise Vital Sign    Days of Exercise per Week: 0 days    Minutes of Exercise per Session: 0 min  Stress: No Stress Concern Present (05/11/2023)   [employer] of Occupational Health - Occupational Stress Questionnaire    Feeling of Stress : Not at all  Social Connections: Socially Isolated (11/14/2023)   Social Connection and Isolation Panel [NHANES]    Frequency of Communication with Friends and Family: More than three times a week    Frequency of Social Gatherings with Friends and Family: More than three times a week    Attends Religious Services: Never    Database administrator or Organizations: No    Attends Banker Meetings: Never    Marital Status: Widowed  Intimate Partner Violence: Not At Risk (11/14/2023)   Humiliation, Afraid, Rape, and Kick questionnaire    Fear of Current or Ex-Partner: No    Emotionally Abused: No    Physically Abused: No    Sexually Abused: No      Family History  Problem Relation Age of Onset   Diabetes Mother    Breast cancer Mother    Depression Mother        anxiety   Heart disease Mother        in her 56s   Colon cancer Sister    Healthy Daughter    Stroke Neg Hx     Vitals:   11/24/23 1454  BP: (!) 100/56  Pulse: 73  SpO2: 96%  Weight: 38.4 kg (84 lb 9.6 oz)  Height: 5' (1.524 m)    PHYSICAL EXAM: General:  frail/weak appearing, in [facility], looks much older than actual age. Wearing supp O2 No respiratory difficulty HEENT: normal Neck: supple. no JVD. Carotids  2+ bilat; no bruits. No lymphadenopathy or thryomegaly appreciated. Cor: PMI nondisplaced. Regular rate & rhythm. 3/6 MR murmur  Lungs: decreased BS at the bases bilaterally  Abdomen: soft, nontender, nondistended. No hepatosplenomegaly. No bruits or masses. Good bowel sounds. Extremities: no cyanosis, clubbing, rash, edema Neuro: alert & oriented x 3, cranial nerves  grossly intact. moves all 4 extremities w/o difficulty. Affect pleasant.  ECG: EKG is reading "atrial flutter" but on my review appears NSR w/ notched p-wave c/w LAE. HR 72 bpm. Suspect computer interpreting t wave and notched p as flutter waves.   ASSESSMENT & PLAN:  1. HFpEF - Echo 3/25 EF 60-65%, RV nl, severe MR - NYHA Class III confounded by COPD fragility and deconditioning and severe MR   - Volume ok/ euvolemic  - Continue Lasix 40 mg daily. Check BMP  - BP soft, don't think she would tolerate additional afterload reduction   2. Severe MR - severe functional atrial MR, confirmed by TEE - HF and Afib optimization imperative (see each problem plan) - has been referred to structural heart clinic for MitraClip evaluation, though very frail.  Has consultation w/ Dr. Excell Seltzer next wk   3. PAF - in setting of severe BAE and severe MR - s/p successful DCCV 3/25 - EKG is reading "atrial flutter" but on my review appears NSR w/ notched p-wave c/w LAE. HR 72 bpm. Suspect computer interpreting t wave and notched p as flutter waves. - given nausea and COPD, will reduce amiodarone to 200 mg daily (was advised at d/c to take 200 bid x 3 days>>200 mg daily but SNF still giving bid) - would avoid long term use and would stop after MitraClip  - on Eliquis for a/c   4. Fragility  Body mass index is 16.52 kg/m. - at SNF for rehab     Referred to HFSW (PCP, Medications, Transportation, ETOH Abuse, Drug Abuse, Insurance, Financial ):  No Refer to Pharmacy:  No Refer to Home Health:  No Refer to Advanced Heart Failure Clinic:  No  Refer to General Cardiology: Yes (already followed)  F/u: keep consultation w/ Dr. Excell Seltzer 4/4. Needs f/u w/ gen cards in 4-6 wks   Robbie Lis, [place] 11/24/2023

## 2023-11-24 NOTE — Progress Notes (Signed)
 Post Operative Evaluation    Procedure/Date of Surgery: Open reduction internal fixation right ankle 2/11  Interval History:   Presents 6 weeks status post the above procedure.  At this time she is now walking in a normal shoe.  She is experiencing soreness and stiffness about the ankle.  While she is continuing to improve slowly   PMH/PSH/Family History/Social History/Meds/Allergies:    Past Medical History:  Diagnosis Date   Anemia    B12 deficient   Arthritis    B12 deficiency    Breast cancer (HCC)    left breast.   Chronic SI joint pain    Bilateral, Dr. Corliss Skains   COPD (chronic obstructive pulmonary disease) (HCC)    Fibromyalgia    Dr. Corliss Skains   GERD (gastroesophageal reflux disease)    Greater trochanteric bursitis of both hips    Dr. Corliss Skains   Hyperlipidemia    IBS (irritable bowel syndrome)    LV dysfunction    iatrogenic from chemotherapy ; on Carvedilol    Migraines    Osteoporosis    Dr Lomax---> transferring to a new gyn   Oxygen dependent    3 liters   Past Surgical History:  Procedure Laterality Date   ABDOMINAL HYSTERECTOMY     BSO for  Endometriosis   CARDIOVERSION N/A 11/16/2023   Procedure: CARDIOVERSION;  Surgeon: Thomasene Ripple, DO;  Location: [facility];  Service: Cardiovascular;  Laterality: N/A;   CARPAL TUNNEL RELEASE     left   CERVICAL DISC SURGERY     COLONOSCOPY W/ POLYPECTOMY  1999   Dr Kinnie Scales   ESOPHAGEAL DILATION  2005   Dr Kinnie Scales   MASTECTOMY  2009   bilateral, Dr Jamey Ripa   ORIF ANKLE FRACTURE Right 10/12/2023   Procedure: RIGHT OPEN REDUCTION INTERNAL FIXATION (ORIF) ANKLE FRACTURE;  Surgeon: Huel Cote, MD;  Location: [facility] OR;  Service: Orthopedics;  Laterality: Right;   SEPTOPLASTY     TRANSESOPHAGEAL ECHOCARDIOGRAM (CATH LAB) N/A 11/16/2023   Procedure: TRANSESOPHAGEAL ECHOCARDIOGRAM;  Surgeon: Thomasene Ripple, DO;  Location: [facility];  Service: Cardiovascular;   Laterality: N/A;   Social History   Socioeconomic History   Marital status: Widowed    Spouse name: Not on file   Number of children: 1   Years of education: Not on file   Highest education level: 12th grade  Occupational History   Occupation: retired, Warehouse manager   Tobacco Use   Smoking status: Former    Current packs/day: 0.00    Average packs/day: 1 pack/day for 30.0 years (30.0 ttl pk-yrs)    Types: Cigarettes    Start date: 08/31/1960    Quit date: 08/31/1990    Years since quitting: 33.2    Passive exposure: Never   Smokeless tobacco: Never  Vaping Use   Vaping status: Never Used  Substance and Sexual Activity   Alcohol use: No   Drug use: Not Currently    Types: Marijuana    Comment: 08/01/2017 last used    Sexual activity: Not Currently  Other Topics Concern   Not on file  Social History Narrative   Lives w/ husband   Social Drivers of Health   Financial Resource Strain: Low Risk  (11/17/2023)   Overall Financial Resource Strain (CARDIA)    Difficulty of Paying Living Expenses:  Not hard at all  Food Insecurity: Food Insecurity Present (11/14/2023)   Hunger Vital Sign    Worried About Running Out of Food in the Last Year: Sometimes true    Ran Out of Food in the Last Year: Sometimes true  Transportation Needs: No Transportation Needs (11/17/2023)   PRAPARE - Administrator, Civil Service (Medical): No    Lack of Transportation (Non-Medical): No  Physical Activity: Inactive (05/11/2023)   Exercise Vital Sign    Days of Exercise per Week: 0 days    Minutes of Exercise per Session: 0 min  Stress: No Stress Concern Present (05/11/2023)   [employer] of Occupational Health - Occupational Stress Questionnaire    Feeling of Stress : Not at all  Social Connections: Socially Isolated (11/14/2023)   Social Connection and Isolation Panel [NHANES]    Frequency of Communication with Friends and Family: More than three times a week    Frequency of Social  Gatherings with Friends and Family: More than three times a week    Attends Religious Services: Never    Database administrator or Organizations: No    Attends Banker Meetings: Never    Marital Status: Widowed   Family History  Problem Relation Age of Onset   Diabetes Mother    Breast cancer Mother    Depression Mother        anxiety   Heart disease Mother        in her 43s   Colon cancer Sister    Healthy Daughter    Stroke Neg Hx    Allergies  Allergen Reactions   Amoxicillin-Pot Clavulanate     diarrhea   Doxycycline     Nausea & vomiting   Venlafaxine     ? Reaction; ? Blurred vision   Current Outpatient Medications  Medication Sig Dispense Refill   acetaminophen (TYLENOL) 500 MG tablet Take 1,000 mg by mouth every 6 (six) hours as needed for moderate pain (pain score 4-6) or mild pain (pain score 1-3).     albuterol (VENTOLIN HFA) 108 (90 Base) MCG/ACT inhaler Inhale 2 puffs every 6 hours as needed 8 g 12   amiodarone (PACERONE) 200 MG tablet Take 2 tablets (400 mg total) by mouth 2 (two) times daily for 3 days, THEN 1 tablet (200 mg total) daily.     apixaban (ELIQUIS) 5 MG TABS tablet Take 1 tablet (5 mg total) by mouth 2 (two) times daily.     atorvastatin (LIPITOR) 40 MG tablet Take 1 tablet (40 mg total) by mouth daily.     Calcium Carbonate-Vit D-Min (CALCIUM 600+D PLUS MINERALS) 600-400 MG-UNIT TABS Take 1 tablet by mouth in the morning.     citalopram (CELEXA) 20 MG tablet Take 1 tablet (20 mg total) by mouth in the morning.     diltiazem (CARDIZEM CD) 120 MG 24 hr capsule Take 1 capsule (120 mg total) by mouth daily. 90 capsule 3   docusate sodium (COLACE) 100 MG capsule Take 100 mg by mouth daily.     furosemide (LASIX) 40 MG tablet Take 1 tablet (40 mg total) by mouth daily.     hydrOXYzine (ATARAX/VISTARIL) 10 MG tablet Take 10 mg by mouth every 6 (six) hours as needed for anxiety.     magnesium oxide (MAG-OX) 400 (240 Mg) MG tablet Take 400 mg  by mouth daily.     methocarbamol (ROBAXIN) 750 MG tablet TAKE 1 TABLET BY MOUTH 2 TIMES DAILY  AS NEEDED FOR MUSCLE SPASMS (NON FORMULARY) 60 tablet 2   Multiple Vitamins-Minerals (MULTIVITAMIN WITH MINERALS) tablet Take 1 tablet by mouth daily.     ondansetron (ZOFRAN-ODT) 4 MG disintegrating tablet Take 4 mg by mouth every 8 (eight) hours as needed.     oxyCODONE (ROXICODONE) 5 MG immediate release tablet Take 1 tablet (5 mg total) by mouth every 4 (four) hours as needed for up to 5 doses for severe pain (pain score 7-10) or breakthrough pain. 3 tablet 0   pantoprazole (PROTONIX) 20 MG tablet Take 20 mg by mouth daily.     umeclidinium bromide (INCRUSE ELLIPTA) 62.5 MCG/ACT AEPB Inhale 1 puff into the lungs daily. 30 each 12   No current facility-administered medications for this visit.   No results found.  Review of Systems:   A ROS was performed including pertinent positives and negatives as documented in the HPI.   Musculoskeletal Exam:    There were no vitals taken for this visit.  Right ankle with swelling and bruising are well-appearing incisions.  There is some skin irritation by the medial malleolus not near the incision.  No active drainage.  Mild swelling about the ankle which is improved  Imaging:    3 views right ankle: Status post bimalleolar open reduction internal fixation without evidence of complication  I personally reviewed and interpreted the radiographs.   Assessment:   6 weeks status post right ankle bimalleolar ankle fracture open reduction internal fixation doing well.  At this time she will continue to weight-bear as tolerated and strengthen for the next 6 weeks.  I will plan to see her back in 6 weeks for final check  Plan :    -Return to clinic 6 weeks for reassessment      I personally saw and evaluated the patient, and participated in the management and treatment plan.  Huel Cote, MD Attending Physician, Orthopedic Surgery  This  document was dictated using Dragon voice recognition software. A reasonable attempt at proof reading has been made to minimize errors.

## 2023-11-24 NOTE — Patient Instructions (Signed)
 Medication Changes:  DECREASE AMIODARONE TO 200MG  ONCE DAILY   Lab Work:  Labs done today, your results will be available in MyChart, we will contact you for abnormal readings.  Follow-Up in: 4-6 WEEKS WITH GENERAL CARDIOLOGY- PLEASE CALL THEIR OFFICE TO GET SCHEDULED [phone]  At the [facility], you and your health needs are our priority. We have a designated team specialized in the treatment of Heart Failure. This Care Team includes your primary Heart Failure Specialized Cardiologist (physician), Advanced Practice Providers (APPs- Physician Assistants and Nurse Practitioners), and Pharmacist who all work together to provide you with the care you need, when you need it.   You may see any of the following providers on your designated Care Team at your next follow up:  Dr. Arvilla Meres Dr. Marca Ancona Dr. Dorthula Nettles Dr. Theresia Bough Tonye Becket, NP Robbie Lis, [place] [facility] [place], [place] Brynda Peon, NP [place] Lee, NP Karle Plumber, PharmD   Please be sure to bring in all your medications bottles to every appointment.   Need to Contact us:  If you have any questions or concerns before your next appointment please send [place] a message through [employer] or call our office at [phone].    TO LEAVE A MESSAGE FOR THE NURSE SELECT OPTION 2, PLEASE LEAVE A MESSAGE INCLUDING: YOUR NAME DATE OF BIRTH CALL BACK NUMBER REASON FOR CALL**this is important as we prioritize the call backs  YOU WILL RECEIVE A CALL BACK THE SAME DAY AS LONG AS YOU CALL BEFORE 4:00 PM

## 2023-11-25 DIAGNOSIS — J449 Chronic obstructive pulmonary disease, unspecified: Secondary | ICD-10-CM | POA: Diagnosis not present

## 2023-11-25 DIAGNOSIS — E43 Unspecified severe protein-calorie malnutrition: Secondary | ICD-10-CM | POA: Diagnosis not present

## 2023-11-25 DIAGNOSIS — S82841D Displaced bimalleolar fracture of right lower leg, subsequent encounter for closed fracture with routine healing: Secondary | ICD-10-CM | POA: Diagnosis not present

## 2023-11-25 DIAGNOSIS — D6869 Other thrombophilia: Secondary | ICD-10-CM | POA: Diagnosis not present

## 2023-11-25 DIAGNOSIS — Z79899 Other long term (current) drug therapy: Secondary | ICD-10-CM | POA: Diagnosis not present

## 2023-11-25 DIAGNOSIS — J9621 Acute and chronic respiratory failure with hypoxia: Secondary | ICD-10-CM | POA: Diagnosis not present

## 2023-11-25 DIAGNOSIS — I4719 Other supraventricular tachycardia: Secondary | ICD-10-CM | POA: Diagnosis not present

## 2023-11-25 DIAGNOSIS — M5030 Other cervical disc degeneration, unspecified cervical region: Secondary | ICD-10-CM | POA: Diagnosis not present

## 2023-11-25 DIAGNOSIS — M4126 Other idiopathic scoliosis, lumbar region: Secondary | ICD-10-CM | POA: Diagnosis not present

## 2023-11-25 DIAGNOSIS — Z7901 Long term (current) use of anticoagulants: Secondary | ICD-10-CM | POA: Diagnosis not present

## 2023-11-25 DIAGNOSIS — I5023 Acute on chronic systolic (congestive) heart failure: Secondary | ICD-10-CM | POA: Diagnosis not present

## 2023-11-26 NOTE — Telephone Encounter (Signed)
 Left message to call back

## 2023-11-29 DIAGNOSIS — Z9181 History of falling: Secondary | ICD-10-CM | POA: Diagnosis not present

## 2023-11-29 DIAGNOSIS — R262 Difficulty in walking, not elsewhere classified: Secondary | ICD-10-CM | POA: Diagnosis not present

## 2023-11-29 DIAGNOSIS — E43 Unspecified severe protein-calorie malnutrition: Secondary | ICD-10-CM | POA: Diagnosis not present

## 2023-11-29 DIAGNOSIS — J9611 Chronic respiratory failure with hypoxia: Secondary | ICD-10-CM | POA: Diagnosis not present

## 2023-11-29 DIAGNOSIS — S82841D Displaced bimalleolar fracture of right lower leg, subsequent encounter for closed fracture with routine healing: Secondary | ICD-10-CM | POA: Diagnosis not present

## 2023-11-29 DIAGNOSIS — M6281 Muscle weakness (generalized): Secondary | ICD-10-CM | POA: Diagnosis not present

## 2023-11-29 NOTE — Telephone Encounter (Signed)
 Confirmed appointment with patient and with [facility].

## 2023-11-30 ENCOUNTER — Emergency Department (HOSPITAL_COMMUNITY)
Admission: EM | Admit: 2023-11-30 | Discharge: 2023-12-01 | Disposition: A | Discharge status: Home / Self Care | Source: Home / Self Care | Attending: Emergency Medicine | Admitting: Emergency Medicine

## 2023-11-30 ENCOUNTER — Emergency Department (HOSPITAL_COMMUNITY)

## 2023-11-30 ENCOUNTER — Encounter (HOSPITAL_COMMUNITY): Payer: Self-pay

## 2023-11-30 ENCOUNTER — Other Ambulatory Visit: Payer: Self-pay

## 2023-11-30 DIAGNOSIS — E43 Unspecified severe protein-calorie malnutrition: Secondary | ICD-10-CM | POA: Diagnosis not present

## 2023-11-30 DIAGNOSIS — D72829 Elevated white blood cell count, unspecified: Secondary | ICD-10-CM | POA: Diagnosis not present

## 2023-11-30 DIAGNOSIS — J439 Emphysema, unspecified: Secondary | ICD-10-CM | POA: Diagnosis not present

## 2023-11-30 DIAGNOSIS — E876 Hypokalemia: Secondary | ICD-10-CM | POA: Diagnosis not present

## 2023-11-30 DIAGNOSIS — S82841D Displaced bimalleolar fracture of right lower leg, subsequent encounter for closed fracture with routine healing: Secondary | ICD-10-CM | POA: Diagnosis not present

## 2023-11-30 DIAGNOSIS — I4891 Unspecified atrial fibrillation: Secondary | ICD-10-CM | POA: Diagnosis not present

## 2023-11-30 DIAGNOSIS — Z7401 Bed confinement status: Secondary | ICD-10-CM | POA: Diagnosis not present

## 2023-11-30 DIAGNOSIS — I509 Heart failure, unspecified: Secondary | ICD-10-CM | POA: Diagnosis not present

## 2023-11-30 DIAGNOSIS — E872 Acidosis, unspecified: Secondary | ICD-10-CM | POA: Diagnosis not present

## 2023-11-30 DIAGNOSIS — R079 Chest pain, unspecified: Secondary | ICD-10-CM | POA: Insufficient documentation

## 2023-11-30 DIAGNOSIS — I3139 Other pericardial effusion (noninflammatory): Secondary | ICD-10-CM | POA: Diagnosis not present

## 2023-11-30 DIAGNOSIS — R627 Adult failure to thrive: Secondary | ICD-10-CM | POA: Diagnosis not present

## 2023-11-30 DIAGNOSIS — Z9181 History of falling: Secondary | ICD-10-CM | POA: Diagnosis not present

## 2023-11-30 DIAGNOSIS — I4819 Other persistent atrial fibrillation: Secondary | ICD-10-CM | POA: Diagnosis not present

## 2023-11-30 DIAGNOSIS — I5033 Acute on chronic diastolic (congestive) heart failure: Secondary | ICD-10-CM | POA: Diagnosis not present

## 2023-11-30 DIAGNOSIS — D638 Anemia in other chronic diseases classified elsewhere: Secondary | ICD-10-CM | POA: Diagnosis not present

## 2023-11-30 DIAGNOSIS — Z681 Body mass index (BMI) 19 or less, adult: Secondary | ICD-10-CM | POA: Diagnosis not present

## 2023-11-30 DIAGNOSIS — R918 Other nonspecific abnormal finding of lung field: Secondary | ICD-10-CM | POA: Diagnosis not present

## 2023-11-30 DIAGNOSIS — I2510 Atherosclerotic heart disease of native coronary artery without angina pectoris: Secondary | ICD-10-CM | POA: Diagnosis not present

## 2023-11-30 DIAGNOSIS — Z743 Need for continuous supervision: Secondary | ICD-10-CM | POA: Diagnosis not present

## 2023-11-30 DIAGNOSIS — J449 Chronic obstructive pulmonary disease, unspecified: Secondary | ICD-10-CM | POA: Diagnosis not present

## 2023-11-30 DIAGNOSIS — Z7901 Long term (current) use of anticoagulants: Secondary | ICD-10-CM | POA: Insufficient documentation

## 2023-11-30 DIAGNOSIS — I5084 End stage heart failure: Secondary | ICD-10-CM | POA: Diagnosis not present

## 2023-11-30 DIAGNOSIS — Z515 Encounter for palliative care: Secondary | ICD-10-CM | POA: Diagnosis not present

## 2023-11-30 DIAGNOSIS — R0602 Shortness of breath: Secondary | ICD-10-CM | POA: Diagnosis not present

## 2023-11-30 DIAGNOSIS — E785 Hyperlipidemia, unspecified: Secondary | ICD-10-CM | POA: Diagnosis not present

## 2023-11-30 DIAGNOSIS — R6889 Other general symptoms and signs: Secondary | ICD-10-CM | POA: Diagnosis not present

## 2023-11-30 DIAGNOSIS — R0689 Other abnormalities of breathing: Secondary | ICD-10-CM | POA: Diagnosis not present

## 2023-11-30 DIAGNOSIS — I5023 Acute on chronic systolic (congestive) heart failure: Secondary | ICD-10-CM | POA: Diagnosis not present

## 2023-11-30 DIAGNOSIS — I517 Cardiomegaly: Secondary | ICD-10-CM | POA: Diagnosis not present

## 2023-11-30 DIAGNOSIS — J9621 Acute and chronic respiratory failure with hypoxia: Secondary | ICD-10-CM | POA: Diagnosis not present

## 2023-11-30 DIAGNOSIS — R0789 Other chest pain: Secondary | ICD-10-CM | POA: Diagnosis not present

## 2023-11-30 DIAGNOSIS — I4719 Other supraventricular tachycardia: Secondary | ICD-10-CM | POA: Diagnosis not present

## 2023-11-30 DIAGNOSIS — I428 Other cardiomyopathies: Secondary | ICD-10-CM | POA: Diagnosis not present

## 2023-11-30 DIAGNOSIS — M6281 Muscle weakness (generalized): Secondary | ICD-10-CM | POA: Diagnosis not present

## 2023-11-30 DIAGNOSIS — I959 Hypotension, unspecified: Secondary | ICD-10-CM | POA: Diagnosis not present

## 2023-11-30 DIAGNOSIS — J9622 Acute and chronic respiratory failure with hypercapnia: Secondary | ICD-10-CM | POA: Diagnosis not present

## 2023-11-30 DIAGNOSIS — R1013 Epigastric pain: Secondary | ICD-10-CM | POA: Insufficient documentation

## 2023-11-30 DIAGNOSIS — J9611 Chronic respiratory failure with hypoxia: Secondary | ICD-10-CM | POA: Diagnosis not present

## 2023-11-30 DIAGNOSIS — R109 Unspecified abdominal pain: Secondary | ICD-10-CM | POA: Diagnosis not present

## 2023-11-30 DIAGNOSIS — R262 Difficulty in walking, not elsewhere classified: Secondary | ICD-10-CM | POA: Diagnosis not present

## 2023-11-30 DIAGNOSIS — R2689 Other abnormalities of gait and mobility: Secondary | ICD-10-CM | POA: Diagnosis not present

## 2023-11-30 DIAGNOSIS — I4892 Unspecified atrial flutter: Secondary | ICD-10-CM | POA: Diagnosis not present

## 2023-11-30 DIAGNOSIS — R64 Cachexia: Secondary | ICD-10-CM | POA: Diagnosis not present

## 2023-11-30 DIAGNOSIS — Z66 Do not resuscitate: Secondary | ICD-10-CM | POA: Diagnosis not present

## 2023-11-30 DIAGNOSIS — I110 Hypertensive heart disease with heart failure: Secondary | ICD-10-CM | POA: Diagnosis not present

## 2023-11-30 DIAGNOSIS — I340 Nonrheumatic mitral (valve) insufficiency: Secondary | ICD-10-CM | POA: Diagnosis not present

## 2023-11-30 DIAGNOSIS — N170 Acute kidney failure with tubular necrosis: Secondary | ICD-10-CM | POA: Diagnosis not present

## 2023-11-30 DIAGNOSIS — K7200 Acute and subacute hepatic failure without coma: Secondary | ICD-10-CM | POA: Diagnosis not present

## 2023-11-30 DIAGNOSIS — R570 Cardiogenic shock: Secondary | ICD-10-CM | POA: Diagnosis not present

## 2023-11-30 LAB — CBC WITH DIFFERENTIAL/PLATELET
Abs Immature Granulocytes: 0.05 10*3/uL (ref 0.00–0.07)
Basophils Absolute: 0.1 10*3/uL (ref 0.0–0.1)
Basophils Relative: 1 %
Eosinophils Absolute: 0 10*3/uL (ref 0.0–0.5)
Eosinophils Relative: 0 %
HCT: 32.6 % — ABNORMAL LOW (ref 36.0–46.0)
Hemoglobin: 10.4 g/dL — ABNORMAL LOW (ref 12.0–15.0)
Immature Granulocytes: 0 %
Lymphocytes Relative: 10 %
Lymphs Abs: 1.3 10*3/uL (ref 0.7–4.0)
MCH: 28.5 pg (ref 26.0–34.0)
MCHC: 31.9 g/dL (ref 30.0–36.0)
MCV: 89.3 fL (ref 80.0–100.0)
Monocytes Absolute: 1.2 10*3/uL — ABNORMAL HIGH (ref 0.1–1.0)
Monocytes Relative: 9 %
Neutro Abs: 10.3 10*3/uL — ABNORMAL HIGH (ref 1.7–7.7)
Neutrophils Relative %: 80 %
Platelets: 448 10*3/uL — ABNORMAL HIGH (ref 150–400)
RBC: 3.65 MIL/uL — ABNORMAL LOW (ref 3.87–5.11)
RDW: 14.9 % (ref 11.5–15.5)
WBC: 12.9 10*3/uL — ABNORMAL HIGH (ref 4.0–10.5)
nRBC: 0 % (ref 0.0–0.2)

## 2023-11-30 LAB — COMPREHENSIVE METABOLIC PANEL WITH GFR
ALT: 14 U/L (ref 0–44)
AST: 23 U/L (ref 15–41)
Albumin: 3.7 g/dL (ref 3.5–5.0)
Alkaline Phosphatase: 68 U/L (ref 38–126)
Anion gap: 12 (ref 5–15)
BUN: 21 mg/dL (ref 8–23)
CO2: 32 mmol/L (ref 22–32)
Calcium: 9.2 mg/dL (ref 8.9–10.3)
Chloride: 94 mmol/L — ABNORMAL LOW (ref 98–111)
Creatinine, Ser: 0.83 mg/dL (ref 0.44–1.00)
GFR, Estimated: >60 mL/min (ref >60)
Glucose, Bld: 99 mg/dL (ref 70–99)
Potassium: 3.1 mmol/L — ABNORMAL LOW (ref 3.5–5.1)
Sodium: 138 mmol/L (ref 135–145)
Total Bilirubin: 0.6 mg/dL (ref 0.0–1.2)
Total Protein: 7.4 g/dL (ref 6.5–8.1)

## 2023-11-30 LAB — URINALYSIS, ROUTINE W REFLEX MICROSCOPIC
Bilirubin Urine: NEGATIVE
Glucose, UA: NEGATIVE mg/dL
Hgb urine dipstick: NEGATIVE
Ketones, ur: NEGATIVE mg/dL
Leukocytes,Ua: NEGATIVE
Nitrite: NEGATIVE
Protein, ur: NEGATIVE mg/dL
Specific Gravity, Urine: 1.031 — ABNORMAL HIGH (ref 1.005–1.030)
pH: 6 (ref 5.0–8.0)

## 2023-11-30 LAB — LIPASE, BLOOD: Lipase: 40 U/L (ref 11–51)

## 2023-11-30 LAB — MAGNESIUM: Magnesium: 1.8 mg/dL (ref 1.7–2.4)

## 2023-11-30 LAB — TROPONIN I (HIGH SENSITIVITY)
Troponin I (High Sensitivity): 6 ng/L (ref <18)
Troponin I (High Sensitivity): 7 ng/L (ref <18)

## 2023-11-30 LAB — BRAIN NATRIURETIC PEPTIDE: B Natriuretic Peptide: 208.5 pg/mL — ABNORMAL HIGH (ref 0.0–100.0)

## 2023-11-30 MED ORDER — IOHEXOL 350 MG/ML SOLN
75.0000 mL | Freq: Once | INTRAVENOUS | Status: AC | PRN
  Administered 2023-11-30: 75 mL via INTRAVENOUS

## 2023-11-30 MED ORDER — ONDANSETRON HCL 4 MG/2ML IJ SOLN
4.0000 mg | Freq: Once | INTRAMUSCULAR | Status: AC
  Administered 2023-11-30: 4 mg via INTRAVENOUS
  Filled 2023-11-30: qty 2

## 2023-11-30 MED ORDER — MORPHINE SULFATE (PF) 4 MG/ML IV SOLN
4.0000 mg | Freq: Once | INTRAVENOUS | Status: AC
  Administered 2023-11-30: 4 mg via INTRAVENOUS
  Filled 2023-11-30: qty 1

## 2023-11-30 MED ORDER — SODIUM CHLORIDE 0.9 % IV BOLUS
1000.0000 mL | Freq: Once | INTRAVENOUS | Status: AC
  Administered 2023-11-30: 1000 mL via INTRAVENOUS

## 2023-11-30 NOTE — ED Provider Notes (Signed)
 [facility] Provider Note   CSN: [id number] Arrival date & time: 11/30/23  1652     History  Chief Complaint  Patient presents with   Shortness of Breath   Abdominal Pain    [name] is a 73 y.o. female.  18 y oF with a chief complaints of abdominal pain and chest pain.  This started earlier today.  Nothing seems to make it better or worse.  Hurts mostly about the epigastrium and in the center of the chest.  Denies cough congestion or fever.  Denies trauma.   Shortness of Breath Associated symptoms: abdominal pain   Abdominal Pain Associated symptoms: shortness of breath        Home Medications Prior to Admission medications   Medication Sig Start Date End Date Taking? Authorizing Provider  acetaminophen (TYLENOL) 500 MG tablet Take 1,000 mg by mouth every 6 (six) hours as needed for moderate pain (pain score 4-6) or mild pain (pain score 1-3).    [provider]  albuterol (VENTOLIN HFA) 108 (90 Base) MCG/ACT inhaler Inhale 2 puffs every 6 hours as needed 02/19/23   Jetty Duhamel D, MD  amiodarone (PACERONE) 200 MG tablet Take 1 tablet (200 mg total) by mouth daily. 11/24/23   Robbie Lis M, PA-C  apixaban (ELIQUIS) 5 MG TABS tablet Take 1 tablet (5 mg total) by mouth 2 (two) times daily. 11/20/23   Lanae Boast, MD  atorvastatin (LIPITOR) 40 MG tablet Take 1 tablet (40 mg total) by mouth daily. 11/21/23   Lanae Boast, MD  Calcium Carbonate-Vit D-Min (CALCIUM 600+D PLUS MINERALS) 600-400 MG-UNIT TABS Take 1 tablet by mouth in the morning.    [provider]  citalopram (CELEXA) 20 MG tablet Take 1 tablet (20 mg total) by mouth in the morning. 11/21/23   Lanae Boast, MD  diltiazem (CARDIZEM CD) 120 MG 24 hr capsule Take 1 capsule (120 mg total) by mouth daily. 02/12/23   Dunn, Tacey Ruiz, PA-C  docusate sodium (COLACE) 100 MG capsule Take 100 mg by mouth daily.    [provider]  furosemide (LASIX) 40  MG tablet Take 1 tablet (40 mg total) by mouth daily. 11/21/23   Lanae Boast, MD  hydrOXYzine (ATARAX/VISTARIL) 10 MG tablet Take 10 mg by mouth every 6 (six) hours as needed for anxiety. 05/31/20   [provider]  magnesium oxide (MAG-OX) 400 (240 Mg) MG tablet Take 400 mg by mouth daily.    [provider]  methocarbamol (ROBAXIN) 750 MG tablet TAKE 1 TABLET BY MOUTH 2 TIMES DAILY AS NEEDED FOR MUSCLE SPASMS (NON FORMULARY) 10/04/23   Pollyann Savoy, MD  Multiple Vitamins-Minerals (MULTIVITAMIN WITH MINERALS) tablet Take 1 tablet by mouth daily.    [provider]  ondansetron (ZOFRAN-ODT) 4 MG disintegrating tablet Take 4 mg by mouth every 8 (eight) hours as needed. 11/09/23   [provider]  oxyCODONE (ROXICODONE) 5 MG immediate release tablet Take 1 tablet (5 mg total) by mouth every 4 (four) hours as needed for up to 5 doses for severe pain (pain score 7-10) or breakthrough pain. 11/20/23   Lanae Boast, MD  pantoprazole (PROTONIX) 20 MG tablet Take 20 mg by mouth daily. 11/09/23   [provider]  umeclidinium bromide (INCRUSE ELLIPTA) 62.5 MCG/ACT AEPB Inhale 1 puff into the lungs daily. 02/19/23   Jetty Duhamel D, MD      Allergies    Amoxicillin-pot clavulanate, Doxycycline, and Venlafaxine  Review of Systems   Review of Systems  Respiratory:  Positive for shortness of breath.   Gastrointestinal:  Positive for abdominal pain.    Physical Exam Updated Vital Signs BP 97/61   Pulse 85   Temp 98.7 F (37.1 C) (Oral)   Resp 17   SpO2 98% Comment: dinamap Physical Exam Vitals and nursing note reviewed.  Constitutional:      General: She is not in acute distress.    Appearance: She is well-developed. She is not diaphoretic.  HENT:     Head: Normocephalic and atraumatic.  Eyes:     Pupils: Pupils are equal, round, and reactive to light.  Cardiovascular:     Rate and Rhythm: Normal rate and regular rhythm.     Heart sounds: No murmur  heard.    No friction rub. No gallop.  Pulmonary:     Effort: Pulmonary effort is normal.     Breath sounds: No wheezing or rales.  Abdominal:     General: There is no distension.     Palpations: Abdomen is soft.     Tenderness: There is abdominal tenderness.     Comments: Mild epigastric tenderness.  Lots of the pain about the syndrome.  Musculoskeletal:        General: No tenderness.     Cervical back: Normal range of motion and neck supple.  Skin:    General: Skin is warm and dry.  Neurological:     Mental Status: She is alert and oriented to person, place, and time.  Psychiatric:        Behavior: Behavior normal.     ED Results / Procedures / Treatments   Labs (all labs ordered are listed, but only abnormal results are displayed) Labs Reviewed  CBC WITH DIFFERENTIAL/PLATELET - Abnormal; Notable for the following components:      Result Value   WBC 12.9 (*)    RBC 3.65 (*)    Hemoglobin 10.4 (*)    HCT 32.6 (*)    Platelets 448 (*)    Neutro Abs 10.3 (*)    Monocytes Absolute 1.2 (*)    All other components within normal limits  COMPREHENSIVE METABOLIC PANEL WITH GFR - Abnormal; Notable for the following components:   Potassium 3.1 (*)    Chloride 94 (*)    All other components within normal limits  BRAIN NATRIURETIC PEPTIDE - Abnormal; Notable for the following components:   B Natriuretic Peptide 208.5 (*)    All other components within normal limits  URINALYSIS, ROUTINE W REFLEX MICROSCOPIC - Abnormal; Notable for the following components:   Specific Gravity, Urine 1.031 (*)    All other components within normal limits  MAGNESIUM  LIPASE, BLOOD  TROPONIN I (HIGH SENSITIVITY)  TROPONIN I (HIGH SENSITIVITY)    EKG None  Radiology CT ABDOMEN PELVIS W CONTRAST Result Date: 11/30/2023 CLINICAL DATA:  Abdominal pain, acute, nonlocalized EXAM: CT ABDOMEN AND PELVIS WITH CONTRAST TECHNIQUE: Multidetector CT imaging of the abdomen and pelvis was performed using the  standard protocol following bolus administration of intravenous contrast. RADIATION DOSE REDUCTION: This exam was performed according to the departmental dose-optimization program which includes automated exposure control, adjustment of the mA and/or kV according to patient size and/or use of iterative reconstruction technique. CONTRAST:  75mL OMNIPAQUE IOHEXOL 350 MG/ML SOLN COMPARISON:  11/15/2023 FINDINGS: Lower chest: Cardiomegaly, small pericardial effusion. No pleural effusions. Left base atelectasis. Hepatobiliary: No focal hepatic abnormality. Gallbladder unremarkable. Pancreas: No focal abnormality or ductal dilatation. Spleen:  No focal abnormality.  Normal size. Adrenals/Urinary Tract: Area of decreased perfusion in the mid to lower pole of the right kidney is similar to prior study concerning for pyelonephritis. Left kidney enhances normally. No stones or hydronephrosis. Adrenal glands and urinary bladder unremarkable. Stomach/Bowel: Large stool burden throughout the colon. Stomach, large and small bowel grossly unremarkable. Vascular/Lymphatic: Aortic atherosclerosis. No evidence of aneurysm or adenopathy. Reproductive: Prior hysterectomy.  No adnexal masses. Other: No free fluid or free air. Musculoskeletal: No acute bony abnormality. IMPRESSION: Continued area of decreased perfusion in the mid to lower pole of the right kidney compatible with pyelonephritis. This is unchanged since recent study. Coronary artery disease, aortic atherosclerosis. Cardiomegaly. Large stool burden throughout the colon. Electronically Signed   By: Charlett Nose M.D.   On: 11/30/2023 22:20   CT Angio Chest PE W and/or Wo Contrast Result Date: 11/30/2023 CLINICAL DATA:  Pulmonary embolism (PE) suspected, high prob. Shortness of breath EXAM: CT ANGIOGRAPHY CHEST WITH CONTRAST TECHNIQUE: Multidetector CT imaging of the chest was performed using the standard protocol during bolus administration of intravenous contrast.  Multiplanar CT image reconstructions and MIPs were obtained to evaluate the vascular anatomy. RADIATION DOSE REDUCTION: This exam was performed according to the departmental dose-optimization program which includes automated exposure control, adjustment of the mA and/or kV according to patient size and/or use of iterative reconstruction technique. CONTRAST:  75mL OMNIPAQUE IOHEXOL 350 MG/ML SOLN COMPARISON:  None Available. FINDINGS: Cardiovascular: Cardiomegaly. Coronary artery and aortic atherosclerosis. No evidence of aortic aneurysm. No filling defects in the pulmonary arteries to suggest pulmonary emboli. Mediastinum/Nodes: Small pericardial effusion. No mediastinal, hilar, or axillary adenopathy. Trachea and esophagus are unremarkable. Thyroid unremarkable. Lungs/Pleura: Moderate emphysema. Compressive atelectasis in the left lower lobe. No confluent opacities or effusions. Upper Abdomen: No acute findings Musculoskeletal: Chest wall soft tissues are unremarkable. Bilateral breast implants grossly unremarkable. No acute bony abnormality. Review of the MIP images confirms the above findings. IMPRESSION: No evidence of pulmonary embolus. Cardiomegaly, coronary artery disease. No acute cardiopulmonary disease. Aortic Atherosclerosis (ICD10-I70.0) and Emphysema (ICD10-J43.9). Electronically Signed   By: Charlett Nose M.D.   On: 11/30/2023 22:18   DG Chest 2 View Result Date: 11/30/2023 CLINICAL DATA:  Shortness of breath EXAM: CHEST - 2 VIEW COMPARISON:  X-ray 11/17/2023.  CTA 11/14/2023 FINDINGS: Hyperinflation. Stable cardiopericardial silhouette. Improving left retrocardiac opacity. No pneumothorax or effusion. No edema. Surgical clips seen along the left axillary region and right chest wall. Fixation hardware along the lower cervical spine. Degenerative changes of the spine. Osteopenia. Breast implants identified. IMPRESSION: Hyperinflation with chronic changes. Improving left retrocardiac opacity  Electronically Signed   By: Karen Kays M.D.   On: 11/30/2023 19:02    Procedures Procedures    Medications Ordered in ED Medications  morphine (PF) 4 MG/ML injection 4 mg (4 mg Intravenous Given 11/30/23 1934)  ondansetron (ZOFRAN) injection 4 mg (4 mg Intravenous Given 11/30/23 1934)  sodium chloride 0.9 % bolus 1,000 mL (0 mLs Intravenous Stopped 11/30/23 2149)  iohexol (OMNIPAQUE) 350 MG/ML injection 75 mL (75 mLs Intravenous Contrast Given 11/30/23 2208)    ED Course/ Medical Decision Making/ A&P                                 Medical Decision Making Amount and/or Complexity of Data Reviewed Labs: ordered. Radiology: ordered.  Risk Prescription drug management.   73 yo F with a cc of chest pain and  abdominal pain.  Going on for the past 12 hours.  Will obtain eval here.  PE scan, ct abdomen pelvis.    CT scan of the chest without occult pneumonia, no PE.  CT scan of the abdomen pelvis with similar findings to last with with the radiologist read is possible pyelonephritis.  UA here is negative.  Patient having no urinary symptoms.  2 troponins are negative.  Will discharge the patient home.  PCP follow-up.   11:01 PM:  I have discussed the diagnosis/risks/treatment options with the patient.  Evaluation and diagnostic testing in the emergency department does not suggest an emergent condition requiring admission or immediate intervention beyond what has been performed at this time.  They will follow up with PCP. We also discussed returning to the ED immediately if new or worsening sx occur. We discussed the sx which are most concerning (e.g., sudden worsening pain, fever, inability to tolerate by mouth) that necessitate immediate return. Medications administered to the patient during their visit and any new prescriptions provided to the patient are listed below.  Medications given during this visit Medications  morphine (PF) 4 MG/ML injection 4 mg (4 mg Intravenous Given 11/30/23 1934)   ondansetron (ZOFRAN) injection 4 mg (4 mg Intravenous Given 11/30/23 1934)  sodium chloride 0.9 % bolus 1,000 mL (0 mLs Intravenous Stopped 11/30/23 2149)  iohexol (OMNIPAQUE) 350 MG/ML injection 75 mL (75 mLs Intravenous Contrast Given 11/30/23 2208)     The patient appears reasonably screen and/or stabilized for discharge and I doubt any other medical condition or other [facility] requiring further screening, evaluation, or treatment in the ED at this time prior to discharge.          Final Clinical Impression(s) / ED Diagnoses Final diagnoses:  Nonspecific chest pain    Rx / DC Orders ED Discharge Orders     None         Melene Plan, DO 11/30/23 2301

## 2023-11-30 NOTE — ED Provider Triage Note (Signed)
 Emergency Medicine Provider Triage Evaluation Note  [name] , a 73 y.o. female  was evaluated in triage.  Pt complains of chest pain/epigastric pain since yesterday morning and has been constant. Reports some SOB. Baseline is on 3L of oxygen. Reports occasional. Denies fever. Denies syncope. Has felt occasional transient lightheadedness.  Review of Systems  Positive: Negative:   Physical Exam  BP 113/63 (BP Location: Left Arm)   Pulse (!) 128   Temp 98.8 F (37.1 C) (Oral)   Resp 17   SpO2 100%  Gen:   Awake, no distress   Resp:  Normal effort  MSK:   Moves extremities without difficulty  Other:    Medical Decision Making  Medically screening exam initiated at 5:56 PM.  Appropriate orders placed.  [name] was informed that the remainder of the evaluation will be completed by another provider, this initial triage assessment does not replace that evaluation, and the importance of remaining in the ED until their evaluation is complete.  EKG shows significant prolonged QT which is a new finding. Nursing aware that the patient needs to be roomed now.    Achille Rich, PA-C 11/30/23 1758

## 2023-11-30 NOTE — Discharge Instructions (Signed)
 Your urine test looks good.  I do not see any signs of infection.  Otherwise your CT scan of your chest and your abdomen did not show any acute findings.  Please follow-up with your family doctor in the office.  Please return for worsening symptoms.

## 2023-11-30 NOTE — ED Triage Notes (Signed)
 Patient BIB [facility] from [facility] for difficulty breathing, patient is 99% on her baseline 3L, patient also reports epigastric pain that feels like a knot that doesn't go anywhere. Worse with inspiration.

## 2023-12-01 DIAGNOSIS — R0789 Other chest pain: Secondary | ICD-10-CM | POA: Diagnosis not present

## 2023-12-01 DIAGNOSIS — I959 Hypotension, unspecified: Secondary | ICD-10-CM | POA: Diagnosis not present

## 2023-12-01 DIAGNOSIS — J9621 Acute and chronic respiratory failure with hypoxia: Secondary | ICD-10-CM | POA: Diagnosis not present

## 2023-12-01 DIAGNOSIS — S82841D Displaced bimalleolar fracture of right lower leg, subsequent encounter for closed fracture with routine healing: Secondary | ICD-10-CM | POA: Diagnosis not present

## 2023-12-01 DIAGNOSIS — I5023 Acute on chronic systolic (congestive) heart failure: Secondary | ICD-10-CM | POA: Diagnosis not present

## 2023-12-01 DIAGNOSIS — D72829 Elevated white blood cell count, unspecified: Secondary | ICD-10-CM | POA: Diagnosis not present

## 2023-12-01 DIAGNOSIS — E876 Hypokalemia: Secondary | ICD-10-CM | POA: Diagnosis not present

## 2023-12-01 DIAGNOSIS — I4891 Unspecified atrial fibrillation: Secondary | ICD-10-CM | POA: Diagnosis not present

## 2023-12-01 DIAGNOSIS — E43 Unspecified severe protein-calorie malnutrition: Secondary | ICD-10-CM | POA: Diagnosis not present

## 2023-12-01 DIAGNOSIS — J9611 Chronic respiratory failure with hypoxia: Secondary | ICD-10-CM | POA: Diagnosis not present

## 2023-12-01 DIAGNOSIS — M6281 Muscle weakness (generalized): Secondary | ICD-10-CM | POA: Diagnosis not present

## 2023-12-01 DIAGNOSIS — J449 Chronic obstructive pulmonary disease, unspecified: Secondary | ICD-10-CM | POA: Diagnosis not present

## 2023-12-01 DIAGNOSIS — Z9181 History of falling: Secondary | ICD-10-CM | POA: Diagnosis not present

## 2023-12-01 DIAGNOSIS — R262 Difficulty in walking, not elsewhere classified: Secondary | ICD-10-CM | POA: Diagnosis not present

## 2023-12-02 ENCOUNTER — Other Ambulatory Visit: Payer: Self-pay

## 2023-12-02 ENCOUNTER — Telehealth: Payer: Self-pay | Admitting: Cardiovascular Disease

## 2023-12-02 ENCOUNTER — Emergency Department (HOSPITAL_COMMUNITY)

## 2023-12-02 ENCOUNTER — Inpatient Hospital Stay (HOSPITAL_COMMUNITY)
Admission: EM | Admit: 2023-12-02 | Discharge: 2023-12-08 | DRG: 291 | Disposition: A | Discharge status: Hospice | Attending: Family Medicine | Admitting: Family Medicine

## 2023-12-02 DIAGNOSIS — I5033 Acute on chronic diastolic (congestive) heart failure: Principal | ICD-10-CM

## 2023-12-02 DIAGNOSIS — I340 Nonrheumatic mitral (valve) insufficiency: Secondary | ICD-10-CM

## 2023-12-02 DIAGNOSIS — R6889 Other general symptoms and signs: Secondary | ICD-10-CM | POA: Diagnosis not present

## 2023-12-02 DIAGNOSIS — R918 Other nonspecific abnormal finding of lung field: Secondary | ICD-10-CM | POA: Diagnosis not present

## 2023-12-02 DIAGNOSIS — E785 Hyperlipidemia, unspecified: Secondary | ICD-10-CM | POA: Diagnosis present

## 2023-12-02 DIAGNOSIS — R1310 Dysphagia, unspecified: Secondary | ICD-10-CM | POA: Diagnosis present

## 2023-12-02 DIAGNOSIS — Z452 Encounter for adjustment and management of vascular access device: Secondary | ICD-10-CM | POA: Diagnosis not present

## 2023-12-02 DIAGNOSIS — Z853 Personal history of malignant neoplasm of breast: Secondary | ICD-10-CM

## 2023-12-02 DIAGNOSIS — I4819 Other persistent atrial fibrillation: Secondary | ICD-10-CM | POA: Diagnosis not present

## 2023-12-02 DIAGNOSIS — I509 Heart failure, unspecified: Secondary | ICD-10-CM | POA: Diagnosis not present

## 2023-12-02 DIAGNOSIS — M1990 Unspecified osteoarthritis, unspecified site: Secondary | ICD-10-CM | POA: Diagnosis present

## 2023-12-02 DIAGNOSIS — I428 Other cardiomyopathies: Secondary | ICD-10-CM | POA: Diagnosis present

## 2023-12-02 DIAGNOSIS — J449 Chronic obstructive pulmonary disease, unspecified: Secondary | ICD-10-CM | POA: Diagnosis not present

## 2023-12-02 DIAGNOSIS — E872 Acidosis, unspecified: Secondary | ICD-10-CM | POA: Diagnosis present

## 2023-12-02 DIAGNOSIS — E43 Unspecified severe protein-calorie malnutrition: Secondary | ICD-10-CM | POA: Diagnosis present

## 2023-12-02 DIAGNOSIS — Z90710 Acquired absence of both cervix and uterus: Secondary | ICD-10-CM

## 2023-12-02 DIAGNOSIS — Z803 Family history of malignant neoplasm of breast: Secondary | ICD-10-CM

## 2023-12-02 DIAGNOSIS — R570 Cardiogenic shock: Secondary | ICD-10-CM | POA: Diagnosis not present

## 2023-12-02 DIAGNOSIS — R0789 Other chest pain: Secondary | ICD-10-CM | POA: Diagnosis not present

## 2023-12-02 DIAGNOSIS — K828 Other specified diseases of gallbladder: Secondary | ICD-10-CM | POA: Diagnosis not present

## 2023-12-02 DIAGNOSIS — Z79899 Other long term (current) drug therapy: Secondary | ICD-10-CM | POA: Diagnosis not present

## 2023-12-02 DIAGNOSIS — Z800 Family history of malignant neoplasm of digestive organs: Secondary | ICD-10-CM

## 2023-12-02 DIAGNOSIS — E8729 Other acidosis: Secondary | ICD-10-CM | POA: Diagnosis not present

## 2023-12-02 DIAGNOSIS — R579 Shock, unspecified: Secondary | ICD-10-CM | POA: Diagnosis not present

## 2023-12-02 DIAGNOSIS — K7200 Acute and subacute hepatic failure without coma: Secondary | ICD-10-CM | POA: Diagnosis not present

## 2023-12-02 DIAGNOSIS — R0602 Shortness of breath: Secondary | ICD-10-CM | POA: Diagnosis not present

## 2023-12-02 DIAGNOSIS — D72829 Elevated white blood cell count, unspecified: Secondary | ICD-10-CM | POA: Diagnosis not present

## 2023-12-02 DIAGNOSIS — Z743 Need for continuous supervision: Secondary | ICD-10-CM | POA: Diagnosis not present

## 2023-12-02 DIAGNOSIS — Z8249 Family history of ischemic heart disease and other diseases of the circulatory system: Secondary | ICD-10-CM

## 2023-12-02 DIAGNOSIS — J9621 Acute and chronic respiratory failure with hypoxia: Secondary | ICD-10-CM | POA: Diagnosis not present

## 2023-12-02 DIAGNOSIS — I480 Paroxysmal atrial fibrillation: Secondary | ICD-10-CM | POA: Diagnosis not present

## 2023-12-02 DIAGNOSIS — N170 Acute kidney failure with tubular necrosis: Secondary | ICD-10-CM | POA: Diagnosis not present

## 2023-12-02 DIAGNOSIS — F32A Depression, unspecified: Secondary | ICD-10-CM | POA: Diagnosis present

## 2023-12-02 DIAGNOSIS — Z880 Allergy status to penicillin: Secondary | ICD-10-CM

## 2023-12-02 DIAGNOSIS — F418 Other specified anxiety disorders: Secondary | ICD-10-CM | POA: Diagnosis present

## 2023-12-02 DIAGNOSIS — I2510 Atherosclerotic heart disease of native coronary artery without angina pectoris: Secondary | ICD-10-CM | POA: Diagnosis present

## 2023-12-02 DIAGNOSIS — Z7901 Long term (current) use of anticoagulants: Secondary | ICD-10-CM

## 2023-12-02 DIAGNOSIS — I4719 Other supraventricular tachycardia: Secondary | ICD-10-CM | POA: Diagnosis present

## 2023-12-02 DIAGNOSIS — J9622 Acute and chronic respiratory failure with hypercapnia: Secondary | ICD-10-CM | POA: Diagnosis not present

## 2023-12-02 DIAGNOSIS — Z833 Family history of diabetes mellitus: Secondary | ICD-10-CM

## 2023-12-02 DIAGNOSIS — Z681 Body mass index (BMI) 19 or less, adult: Secondary | ICD-10-CM

## 2023-12-02 DIAGNOSIS — S82841D Displaced bimalleolar fracture of right lower leg, subsequent encounter for closed fracture with routine healing: Secondary | ICD-10-CM | POA: Diagnosis not present

## 2023-12-02 DIAGNOSIS — E538 Deficiency of other specified B group vitamins: Secondary | ICD-10-CM | POA: Diagnosis present

## 2023-12-02 DIAGNOSIS — Z515 Encounter for palliative care: Secondary | ICD-10-CM

## 2023-12-02 DIAGNOSIS — K219 Gastro-esophageal reflux disease without esophagitis: Secondary | ICD-10-CM | POA: Diagnosis present

## 2023-12-02 DIAGNOSIS — Z7189 Other specified counseling: Secondary | ICD-10-CM | POA: Diagnosis not present

## 2023-12-02 DIAGNOSIS — R578 Other shock: Secondary | ICD-10-CM | POA: Diagnosis not present

## 2023-12-02 DIAGNOSIS — E876 Hypokalemia: Secondary | ICD-10-CM | POA: Diagnosis present

## 2023-12-02 DIAGNOSIS — R627 Adult failure to thrive: Secondary | ICD-10-CM | POA: Diagnosis present

## 2023-12-02 DIAGNOSIS — Z888 Allergy status to other drugs, medicaments and biological substances status: Secondary | ICD-10-CM

## 2023-12-02 DIAGNOSIS — R7989 Other specified abnormal findings of blood chemistry: Secondary | ICD-10-CM | POA: Diagnosis not present

## 2023-12-02 DIAGNOSIS — D638 Anemia in other chronic diseases classified elsewhere: Secondary | ICD-10-CM | POA: Diagnosis present

## 2023-12-02 DIAGNOSIS — Z818 Family history of other mental and behavioral disorders: Secondary | ICD-10-CM

## 2023-12-02 DIAGNOSIS — N179 Acute kidney failure, unspecified: Secondary | ICD-10-CM | POA: Diagnosis not present

## 2023-12-02 DIAGNOSIS — R64 Cachexia: Secondary | ICD-10-CM | POA: Diagnosis present

## 2023-12-02 DIAGNOSIS — I5023 Acute on chronic systolic (congestive) heart failure: Secondary | ICD-10-CM | POA: Diagnosis not present

## 2023-12-02 DIAGNOSIS — I4892 Unspecified atrial flutter: Secondary | ICD-10-CM | POA: Diagnosis present

## 2023-12-02 DIAGNOSIS — M810 Age-related osteoporosis without current pathological fracture: Secondary | ICD-10-CM | POA: Diagnosis present

## 2023-12-02 DIAGNOSIS — I5084 End stage heart failure: Secondary | ICD-10-CM | POA: Diagnosis present

## 2023-12-02 DIAGNOSIS — I499 Cardiac arrhythmia, unspecified: Secondary | ICD-10-CM | POA: Diagnosis not present

## 2023-12-02 DIAGNOSIS — I3139 Other pericardial effusion (noninflammatory): Secondary | ICD-10-CM

## 2023-12-02 DIAGNOSIS — Z87891 Personal history of nicotine dependence: Secondary | ICD-10-CM

## 2023-12-02 DIAGNOSIS — G8929 Other chronic pain: Secondary | ICD-10-CM | POA: Diagnosis present

## 2023-12-02 DIAGNOSIS — K589 Irritable bowel syndrome without diarrhea: Secondary | ICD-10-CM | POA: Diagnosis present

## 2023-12-02 DIAGNOSIS — J9611 Chronic respiratory failure with hypoxia: Secondary | ICD-10-CM | POA: Diagnosis not present

## 2023-12-02 DIAGNOSIS — E8809 Other disorders of plasma-protein metabolism, not elsewhere classified: Secondary | ICD-10-CM | POA: Diagnosis present

## 2023-12-02 DIAGNOSIS — I700 Atherosclerosis of aorta: Secondary | ICD-10-CM | POA: Diagnosis not present

## 2023-12-02 DIAGNOSIS — J90 Pleural effusion, not elsewhere classified: Secondary | ICD-10-CM | POA: Diagnosis not present

## 2023-12-02 DIAGNOSIS — Z66 Do not resuscitate: Secondary | ICD-10-CM | POA: Diagnosis not present

## 2023-12-02 DIAGNOSIS — I959 Hypotension, unspecified: Secondary | ICD-10-CM | POA: Diagnosis not present

## 2023-12-02 DIAGNOSIS — R54 Age-related physical debility: Secondary | ICD-10-CM | POA: Diagnosis present

## 2023-12-02 DIAGNOSIS — Z9013 Acquired absence of bilateral breasts and nipples: Secondary | ICD-10-CM

## 2023-12-02 DIAGNOSIS — M797 Fibromyalgia: Secondary | ICD-10-CM | POA: Diagnosis present

## 2023-12-02 DIAGNOSIS — I110 Hypertensive heart disease with heart failure: Secondary | ICD-10-CM | POA: Diagnosis not present

## 2023-12-02 DIAGNOSIS — Z789 Other specified health status: Secondary | ICD-10-CM | POA: Diagnosis not present

## 2023-12-02 DIAGNOSIS — J439 Emphysema, unspecified: Secondary | ICD-10-CM | POA: Diagnosis present

## 2023-12-02 DIAGNOSIS — Z881 Allergy status to other antibiotic agents status: Secondary | ICD-10-CM

## 2023-12-02 DIAGNOSIS — I4891 Unspecified atrial fibrillation: Secondary | ICD-10-CM | POA: Diagnosis not present

## 2023-12-02 DIAGNOSIS — Z9981 Dependence on supplemental oxygen: Secondary | ICD-10-CM

## 2023-12-02 LAB — COMPREHENSIVE METABOLIC PANEL WITH GFR
ALT: 12 U/L (ref 0–44)
AST: 18 U/L (ref 15–41)
Albumin: 3 g/dL — ABNORMAL LOW (ref 3.5–5.0)
Alkaline Phosphatase: 49 U/L (ref 38–126)
Anion gap: 12 (ref 5–15)
BUN: 23 mg/dL (ref 8–23)
CO2: 31 mmol/L (ref 22–32)
Calcium: 9.4 mg/dL (ref 8.9–10.3)
Chloride: 95 mmol/L — ABNORMAL LOW (ref 98–111)
Creatinine, Ser: 0.75 mg/dL (ref 0.44–1.00)
GFR, Estimated: >60 mL/min (ref >60)
Glucose, Bld: 132 mg/dL — ABNORMAL HIGH (ref 70–99)
Potassium: 3.9 mmol/L (ref 3.5–5.1)
Sodium: 138 mmol/L (ref 135–145)
Total Bilirubin: 0.3 mg/dL (ref 0.0–1.2)
Total Protein: 6.6 g/dL (ref 6.5–8.1)

## 2023-12-02 LAB — URINALYSIS, W/ REFLEX TO CULTURE (INFECTION SUSPECTED)
Bilirubin Urine: NEGATIVE
Glucose, UA: NEGATIVE mg/dL
Hgb urine dipstick: NEGATIVE
Ketones, ur: NEGATIVE mg/dL
Leukocytes,Ua: NEGATIVE
Nitrite: NEGATIVE
Protein, ur: 30 mg/dL — AB
RBC / HPF: NONE SEEN RBC/hpf (ref 0–5)
Specific Gravity, Urine: >1.03 — ABNORMAL HIGH (ref 1.005–1.030)
pH: 5.5 (ref 5.0–8.0)

## 2023-12-02 LAB — CBC
HCT: 28.6 % — ABNORMAL LOW (ref 36.0–46.0)
Hemoglobin: 9 g/dL — ABNORMAL LOW (ref 12.0–15.0)
MCH: 28.2 pg (ref 26.0–34.0)
MCHC: 31.5 g/dL (ref 30.0–36.0)
MCV: 89.7 fL (ref 80.0–100.0)
Platelets: 357 10*3/uL (ref 150–400)
RBC: 3.19 MIL/uL — ABNORMAL LOW (ref 3.87–5.11)
RDW: 14.8 % (ref 11.5–15.5)
WBC: 10.7 10*3/uL — ABNORMAL HIGH (ref 4.0–10.5)
nRBC: 0 % (ref 0.0–0.2)

## 2023-12-02 LAB — BRAIN NATRIURETIC PEPTIDE: B Natriuretic Peptide: 746.7 pg/mL — ABNORMAL HIGH (ref 0.0–100.0)

## 2023-12-02 LAB — RESP PANEL BY RT-PCR (RSV, FLU A&B, COVID)  RVPGX2
Influenza A by PCR: NEGATIVE
Influenza B by PCR: NEGATIVE
Resp Syncytial Virus by PCR: NEGATIVE
SARS Coronavirus 2 by RT PCR: NEGATIVE

## 2023-12-02 LAB — TROPONIN I (HIGH SENSITIVITY)
Troponin I (High Sensitivity): 18 ng/L — ABNORMAL HIGH (ref <18)
Troponin I (High Sensitivity): 6 ng/L (ref <18)

## 2023-12-02 MED ORDER — FUROSEMIDE 10 MG/ML IJ SOLN
20.0000 mg | Freq: Once | INTRAMUSCULAR | Status: AC
  Administered 2023-12-02: 20 mg via INTRAVENOUS
  Filled 2023-12-02: qty 2

## 2023-12-02 MED ORDER — MIDODRINE HCL 5 MG PO TABS
10.0000 mg | ORAL_TABLET | ORAL | Status: AC
  Administered 2023-12-02: 10 mg via ORAL
  Filled 2023-12-02: qty 2

## 2023-12-02 MED ORDER — ATORVASTATIN CALCIUM 40 MG PO TABS
40.0000 mg | ORAL_TABLET | Freq: Every day | ORAL | Status: DC
  Administered 2023-12-03 – 2023-12-04 (×2): 40 mg via ORAL
  Filled 2023-12-02 (×2): qty 1

## 2023-12-02 MED ORDER — SODIUM CHLORIDE 0.9 % IV BOLUS
500.0000 mL | Freq: Once | INTRAVENOUS | Status: AC
  Administered 2023-12-02: 500 mL via INTRAVENOUS

## 2023-12-02 MED ORDER — PROCHLORPERAZINE EDISYLATE 10 MG/2ML IJ SOLN
5.0000 mg | Freq: Four times a day (QID) | INTRAMUSCULAR | Status: DC | PRN
  Administered 2023-12-03 – 2023-12-04 (×2): 5 mg via INTRAVENOUS
  Filled 2023-12-02 (×2): qty 2

## 2023-12-02 MED ORDER — CITALOPRAM HYDROBROMIDE 20 MG PO TABS
20.0000 mg | ORAL_TABLET | Freq: Every day | ORAL | Status: DC
  Administered 2023-12-03 – 2023-12-08 (×6): 20 mg via ORAL
  Filled 2023-12-02 (×6): qty 1

## 2023-12-02 MED ORDER — POLYETHYLENE GLYCOL 3350 17 G PO PACK: 17.0000 g | PACK | Freq: Every day | ORAL | Status: DC | PRN

## 2023-12-02 MED ORDER — ACETAMINOPHEN 325 MG PO TABS: 650.0000 mg | ORAL_TABLET | Freq: Four times a day (QID) | ORAL | Status: DC | PRN

## 2023-12-02 MED ORDER — APIXABAN 5 MG PO TABS
5.0000 mg | ORAL_TABLET | Freq: Two times a day (BID) | ORAL | Status: DC
  Administered 2023-12-03 – 2023-12-08 (×12): 5 mg via ORAL
  Filled 2023-12-02 (×12): qty 1

## 2023-12-02 MED ORDER — MELATONIN 5 MG PO TABS
5.0000 mg | ORAL_TABLET | Freq: Every evening | ORAL | Status: DC | PRN
  Administered 2023-12-03 – 2023-12-06 (×4): 5 mg via ORAL
  Filled 2023-12-02 (×4): qty 1

## 2023-12-02 NOTE — ED Provider Notes (Signed)
 [place] EMERGENCY DEPARTMENT AT [facility] Provider Note   CSN: [id number] Arrival date & time: 12/02/23  1351     History  Chief Complaint  Patient presents with   Shortness of Breath    [name] is a 73 y.o. female.  This is a 74 year old female presents the emergency department for weakness.  Reports generalized weakness for the past week.  Some lightheadedness intermittently as well.  Notes some intermittent palpitations, no syncope or near syncope.  Denies chest pain.  Reportedly took her baseline 3 L nasal cannula off went to the bathroom and was hypoxic with an elevated heart rate.  Put back on oxygen and given breathing treatment with no improvement per facility, on my evaluation patient states that shortness of breath is improved and has no respiratory complaints.   Shortness of Breath      Home Medications Prior to Admission medications   Medication Sig Start Date End Date Taking? Authorizing Provider  acetaminophen (TYLENOL) 500 MG tablet Take 1,000 mg by mouth every 6 (six) hours as needed for moderate pain (pain score 4-6) or mild pain (pain score 1-3).    [provider]  albuterol (VENTOLIN HFA) 108 (90 Base) MCG/ACT inhaler Inhale 2 puffs every 6 hours as needed 02/19/23   Jetty Duhamel D, MD  amiodarone (PACERONE) 200 MG tablet Take 1 tablet (200 mg total) by mouth daily. 11/24/23   Robbie Lis M, PA-C  apixaban (ELIQUIS) 5 MG TABS tablet Take 1 tablet (5 mg total) by mouth 2 (two) times daily. 11/20/23   Lanae Boast, MD  atorvastatin (LIPITOR) 40 MG tablet Take 1 tablet (40 mg total) by mouth daily. 11/21/23   Lanae Boast, MD  Calcium Carbonate-Vit D-Min (CALCIUM 600+D PLUS MINERALS) 600-400 MG-UNIT TABS Take 1 tablet by mouth in the morning.    [provider]  citalopram (CELEXA) 20 MG tablet Take 1 tablet (20 mg total) by mouth in the morning. 11/21/23   Lanae Boast, MD  diltiazem (CARDIZEM CD) 120 MG 24 hr capsule Take 1  capsule (120 mg total) by mouth daily. 02/12/23   Dunn, Tacey Ruiz, PA-C  docusate sodium (COLACE) 100 MG capsule Take 100 mg by mouth daily.    [provider]  furosemide (LASIX) 40 MG tablet Take 1 tablet (40 mg total) by mouth daily. 11/21/23   Lanae Boast, MD  hydrOXYzine (ATARAX/VISTARIL) 10 MG tablet Take 10 mg by mouth every 6 (six) hours as needed for anxiety. 05/31/20   [provider]  magnesium oxide (MAG-OX) 400 (240 Mg) MG tablet Take 400 mg by mouth daily.    [provider]  methocarbamol (ROBAXIN) 750 MG tablet TAKE 1 TABLET BY MOUTH 2 TIMES DAILY AS NEEDED FOR MUSCLE SPASMS (NON FORMULARY) 10/04/23   Pollyann Savoy, MD  Multiple Vitamins-Minerals (MULTIVITAMIN WITH MINERALS) tablet Take 1 tablet by mouth daily.    [provider]  ondansetron (ZOFRAN-ODT) 4 MG disintegrating tablet Take 4 mg by mouth every 8 (eight) hours as needed. 11/09/23   [provider]  oxyCODONE (ROXICODONE) 5 MG immediate release tablet Take 1 tablet (5 mg total) by mouth every 4 (four) hours as needed for up to 5 doses for severe pain (pain score 7-10) or breakthrough pain. 11/20/23   Lanae Boast, MD  pantoprazole (PROTONIX) 20 MG tablet Take 20 mg by mouth daily. 11/09/23   [provider]  umeclidinium bromide (INCRUSE ELLIPTA) 62.5 MCG/ACT AEPB Inhale 1 puff into the lungs daily.  02/19/23   Waymon Budge, MD      Allergies    Amoxicillin-pot clavulanate, Doxycycline, and Venlafaxine    Review of Systems   Review of Systems  Respiratory:  Positive for shortness of breath.     Physical Exam Updated Vital Signs BP (!) 95/56   Pulse 71   Temp 98.6 F (37 C) (Oral)   Resp (!) 22   Ht 5' (1.524 m)   Wt 38.4 kg   SpO2 100%   BMI 16.53 kg/m  Physical Exam Vitals and nursing note reviewed.  Constitutional:      General: She is not in acute distress.    Appearance: She is not toxic-appearing.  HENT:     Head: Normocephalic.  Cardiovascular:      Rate and Rhythm: Normal rate and regular rhythm.  Pulmonary:     Effort: Pulmonary effort is normal.     Breath sounds: No decreased breath sounds, wheezing, rhonchi or rales.     Comments: Maintaining oxygen saturation on room air does not appear to be in respiratory distress. Chest:     Chest wall: No mass or tenderness.  Musculoskeletal:        General: Normal range of motion.     Right lower leg: No edema.     Left lower leg: No edema.  Skin:    General: Skin is warm.     Capillary Refill: Capillary refill takes less than 2 seconds.  Neurological:     Mental Status: She is oriented to person, place, and time.  Psychiatric:        Mood and Affect: Mood normal.        Behavior: Behavior normal.     ED Results / Procedures / Treatments   Labs (all labs ordered are listed, but only abnormal results are displayed) Labs Reviewed  COMPREHENSIVE METABOLIC PANEL WITH GFR - Abnormal; Notable for the following components:      Result Value   Chloride 95 (*)    Glucose, Bld 132 (*)    Albumin 3.0 (*)    All other components within normal limits  CBC - Abnormal; Notable for the following components:   WBC 10.7 (*)    RBC 3.19 (*)    Hemoglobin 9.0 (*)    HCT 28.6 (*)    All other components within normal limits  RESP PANEL BY RT-PCR (RSV, FLU A&B, COVID)  [id number]  URINALYSIS, W/ REFLEX TO CULTURE (INFECTION SUSPECTED)    EKG None  Radiology DG Chest Port 1 View Result Date: 12/02/2023 CLINICAL DATA:  Shortness of breath. EXAM: PORTABLE CHEST 1 VIEW COMPARISON:  11/30/2023. FINDINGS: Stable cardiomediastinal silhouette. Emphysematous changes again seen. Similar bibasilar atelectatic changes. No focal consolidation, sizeable effusion, or pneumothorax. Lower cervical fixation hardware. Bilateral breast prosthesis. No acute osseous abnormality. IMPRESSION: Emphysema.  Similar bibasilar atelectatic changes. Electronically Signed   By: Hart Robinsons M.D.   On: 12/02/2023 16:11    CT ABDOMEN PELVIS W CONTRAST Result Date: 11/30/2023 CLINICAL DATA:  Abdominal pain, acute, nonlocalized EXAM: CT ABDOMEN AND PELVIS WITH CONTRAST TECHNIQUE: Multidetector CT imaging of the abdomen and pelvis was performed using the standard protocol following bolus administration of intravenous contrast. RADIATION DOSE REDUCTION: This exam was performed according to the departmental dose-optimization program which includes automated exposure control, adjustment of the mA and/or kV according to patient size and/or use of iterative reconstruction technique. CONTRAST:  75mL OMNIPAQUE IOHEXOL 350 MG/ML SOLN COMPARISON:  11/15/2023 FINDINGS: Lower chest: Cardiomegaly, small  pericardial effusion. No pleural effusions. Left base atelectasis. Hepatobiliary: No focal hepatic abnormality. Gallbladder unremarkable. Pancreas: No focal abnormality or ductal dilatation. Spleen: No focal abnormality.  Normal size. Adrenals/Urinary Tract: Area of decreased perfusion in the mid to lower pole of the right kidney is similar to prior study concerning for pyelonephritis. Left kidney enhances normally. No stones or hydronephrosis. Adrenal glands and urinary bladder unremarkable. Stomach/Bowel: Large stool burden throughout the colon. Stomach, large and small bowel grossly unremarkable. Vascular/Lymphatic: Aortic atherosclerosis. No evidence of aneurysm or adenopathy. Reproductive: Prior hysterectomy.  No adnexal masses. Other: No free fluid or free air. Musculoskeletal: No acute bony abnormality. IMPRESSION: Continued area of decreased perfusion in the mid to lower pole of the right kidney compatible with pyelonephritis. This is unchanged since recent study. Coronary artery disease, aortic atherosclerosis. Cardiomegaly. Large stool burden throughout the colon. Electronically Signed   By: Charlett Nose M.D.   On: 11/30/2023 22:20   CT Angio Chest PE W and/or Wo Contrast Result Date: 11/30/2023 CLINICAL DATA:  Pulmonary embolism (PE)  suspected, high prob. Shortness of breath EXAM: CT ANGIOGRAPHY CHEST WITH CONTRAST TECHNIQUE: Multidetector CT imaging of the chest was performed using the standard protocol during bolus administration of intravenous contrast. Multiplanar CT image reconstructions and MIPs were obtained to evaluate the vascular anatomy. RADIATION DOSE REDUCTION: This exam was performed according to the departmental dose-optimization program which includes automated exposure control, adjustment of the mA and/or kV according to patient size and/or use of iterative reconstruction technique. CONTRAST:  75mL OMNIPAQUE IOHEXOL 350 MG/ML SOLN COMPARISON:  None Available. FINDINGS: Cardiovascular: Cardiomegaly. Coronary artery and aortic atherosclerosis. No evidence of aortic aneurysm. No filling defects in the pulmonary arteries to suggest pulmonary emboli. Mediastinum/Nodes: Small pericardial effusion. No mediastinal, hilar, or axillary adenopathy. Trachea and esophagus are unremarkable. Thyroid unremarkable. Lungs/Pleura: Moderate emphysema. Compressive atelectasis in the left lower lobe. No confluent opacities or effusions. Upper Abdomen: No acute findings Musculoskeletal: Chest wall soft tissues are unremarkable. Bilateral breast implants grossly unremarkable. No acute bony abnormality. Review of the MIP images confirms the above findings. IMPRESSION: No evidence of pulmonary embolus. Cardiomegaly, coronary artery disease. No acute cardiopulmonary disease. Aortic Atherosclerosis (ICD10-I70.0) and Emphysema (ICD10-J43.9). Electronically Signed   By: Charlett Nose M.D.   On: 11/30/2023 22:18   DG Chest 2 View Result Date: 11/30/2023 CLINICAL DATA:  Shortness of breath EXAM: CHEST - 2 VIEW COMPARISON:  X-ray 11/17/2023.  CTA 11/14/2023 FINDINGS: Hyperinflation. Stable cardiopericardial silhouette. Improving left retrocardiac opacity. No pneumothorax or effusion. No edema. Surgical clips seen along the left axillary region and right chest  wall. Fixation hardware along the lower cervical spine. Degenerative changes of the spine. Osteopenia. Breast implants identified. IMPRESSION: Hyperinflation with chronic changes. Improving left retrocardiac opacity Electronically Signed   By: Karen Kays M.D.   On: 11/30/2023 19:02    Procedures Procedures    Medications Ordered in ED Medications  sodium chloride 0.9 % bolus 500 mL (500 mLs Intravenous New Bag/Given 12/02/23 1601)    ED Course/ Medical Decision Making/ A&P Clinical Course as of 12/02/23 1701  Thu Dec 02, 2023  1536 BP(!): 95/56 Blood pressure typically on the lower side.  2 days ago similar, but in the hospital looks like she runs in the low 100s systolic. [TY]  1538 Admitted 27/54: "73 year old female with medical history significant for nonischemic cardiomyopathy, mitral regurgitation, SVT, GERD, hyperlipidemia, IBS, recent bimalleolar ankle fracture after fall presented from SNF for evaluation of increased heart rate, shortness of breath for  2 days.  Patient is noted to have atrial tachycardia, started on Cardizem drip admitted to [facility] service for further management. Cardiology consulted, started on amiodarone drip, diuretics for a flutter/A-fib with RVR MAT, acute on chronic HFrEF w/ pericardial effusion. Subsequently noted to be severe atrial functional MR-cardiology recommended structural team evaluation for mitraclip. At this time patient volume status optimized to transition to oral diuretic 3/21, planning for structural team evaluation for MitraClip 12/03/2023 outpatient visit.  Patient is medically stable to discharge to skilled nursing facility Awaiting on Insurance approval and peer to peer done 11/22/23   Consultation: Cardiology " [TY]  1634 DG Chest Port 1 View IMPRESSION: Emphysema.  Similar bibasilar atelectatic changes.   Electronically Signed   By: Hart Robinsons M.D.   On: 12/02/2023 16:11   [TY]  1634 CBC(!) Stable anemia.  Mild leukocytosis,  improved compared to prior [TY]  1634 Comprehensive metabolic panel(!) No significant metabolic derangements.  Normal kidney function.  No transaminitis to suggest PAD ability disease. [TY]    Clinical Course User Index [TY] Coral Spikes, DO                                 Medical Decision Making This is a 73 year old female with fibromyalgia, GERD, COPD oxygen dependent on 3 L presented to the emergency department after ambulating without her oxygen level and became short of breath.  She is afebrile, nontachycardic, blood pressure is soft 95/56, seemingly always with blood pressure on the lower side.  Recent admission; see ED course for chart review.  She does not appear to be in respiratory distress.  Complaining of some nonspecific generalized weakness, but not having chest pain.  Shortness of breath improved.  Lungs are clear.  Chest x-ray without pneumonia pneumothorax.  Lab work largely reassuring.  Receiving IV fluids.  Awaiting UA.  Care signed out to afternoon team disposition pending UA.   Amount and/or Complexity of Data Reviewed Labs: ordered. Decision-making details documented in ED Course. Radiology: ordered. Decision-making details documented in ED Course.         Final Clinical Impression(s) / ED Diagnoses Final diagnoses:  None    Rx / DC Orders ED Discharge Orders     None         Coral Spikes, DO 12/02/23 1702

## 2023-12-02 NOTE — H&P (Signed)
 History and Physical  [name] ZDG:[id number] DOB: Sep 25, 1950 DOA: 12/02/2023  Referring physician: Dr. Anitra Lauth, EDP  PCP: Melida Quitter, PA  Outpatient Specialists: Cardiology, orthopedic surgery. Patient coming from: SNF.  Chief Complaint: Shortness of breath.  HPI: [name] is a 73 y.o. female with medical history significant for COPD on 3 L nasal cannula continuously, nonischemic cardiomyopathy, hyperlipidemia, GERD, recent bimalleolar ankle fracture after fall, paroxysmal A-fib/A-flutter on Eliquis, history of severe MR, moderate pericardial effusion, chronic HFrEF, who presents to the ER via EMS from SNF due to worsening shortness of breath.  Associated with generalized weakness and intermittent palpitations.  No reported chest pain.  In the ER, cardiomegaly with concern for edema on chest x-ray.  BNP elevated greater than 700.  Bilateral lower extremity edema.  The patient received IV Lasix in the ER.  EDP discussed the case with cardiology who recommended medicine admission for further diuresis and management of acute on chronic HFpEF.  Admitted by [facility], hospitalist service.  At the time of this visit, the patient is alert and oriented x 3.  BPs are soft.  Symptoms are improved, will benefit from further diuresing.  ED Course: Temperature 97.8.  BP 87/53, pulse 61, respiratory 17, O2 saturation 99% on 3 L.  Lab studies notable for troponin 18, 6.  BNP 746.  Albumin 3.0.  WBC 10.7.  Hemoglobin 9.0.  Platelet count 357.  Review of Systems: Review of systems as noted in the HPI. All other systems reviewed and are negative.   Past Medical History:  Diagnosis Date   Anemia    B12 deficient   Arthritis    B12 deficiency    Breast cancer (HCC)    left breast.   Chronic SI joint pain    Bilateral, Dr. Corliss Skains   COPD (chronic obstructive pulmonary disease) (HCC)    Fibromyalgia    Dr. Corliss Skains   GERD (gastroesophageal reflux disease)    Greater trochanteric  bursitis of both hips    Dr. Corliss Skains   Hyperlipidemia    IBS (irritable bowel syndrome)    LV dysfunction    iatrogenic from chemotherapy ; on Carvedilol    Migraines    Osteoporosis    Dr Lomax---> transferring to a new gyn   Oxygen dependent    3 liters   Past Surgical History:  Procedure Laterality Date   ABDOMINAL HYSTERECTOMY     BSO for  Endometriosis   CARDIOVERSION N/A 11/16/2023   Procedure: CARDIOVERSION;  Surgeon: Thomasene Ripple, DO;  Location: [facility] INVASIVE [facility];  Service: Cardiovascular;  Laterality: N/A;   CARPAL TUNNEL RELEASE     left   CERVICAL DISC SURGERY     COLONOSCOPY W/ POLYPECTOMY  1999   Dr Kinnie Scales   ESOPHAGEAL DILATION  2005   Dr Kinnie Scales   MASTECTOMY  2009   bilateral, Dr Jamey Ripa   ORIF ANKLE FRACTURE Right 10/12/2023   Procedure: RIGHT OPEN REDUCTION INTERNAL FIXATION (ORIF) ANKLE FRACTURE;  Surgeon: Huel Cote, MD;  Location: [facility] OR;  Service: Orthopedics;  Laterality: Right;   SEPTOPLASTY     TRANSESOPHAGEAL ECHOCARDIOGRAM (CATH LAB) N/A 11/16/2023   Procedure: TRANSESOPHAGEAL ECHOCARDIOGRAM;  Surgeon: Thomasene Ripple, DO;  Location: [facility] INV[facility];  Service: Cardiovascular;  Laterality: N/A;    Social History:  reports that she quit smoking about 33 years ago. Her smoking use included cigarettes. She started smoking about 63 years ago. She has a 30 pack-year smoking history. She has never been exposed to  tobacco smoke. She has never used smokeless tobacco. She reports that she does not currently use drugs after having used the following drugs: Marijuana. She reports that she does not drink alcohol.   Allergies  Allergen Reactions   Amoxicillin-Pot Clavulanate     diarrhea   Doxycycline     Nausea & vomiting   Venlafaxine     ? Reaction; ? Blurred vision    Family History  Problem Relation Age of Onset   Diabetes Mother    Breast cancer Mother    Depression Mother        anxiety   Heart disease Mother        in her 77s   Colon cancer  Sister    Healthy Daughter    Stroke Neg Hx       Prior to Admission medications   Medication Sig Start Date End Date Taking? Authorizing Provider  acetaminophen (TYLENOL) 500 MG tablet Take 1,000 mg by mouth every 6 (six) hours as needed for moderate pain (pain score 4-6) or mild pain (pain score 1-3).    [provider]  albuterol (VENTOLIN HFA) 108 (90 Base) MCG/ACT inhaler Inhale 2 puffs every 6 hours as needed 02/19/23   Jetty Duhamel D, MD  amiodarone (PACERONE) 200 MG tablet Take 1 tablet (200 mg total) by mouth daily. 11/24/23   Robbie Lis M, PA-C  apixaban (ELIQUIS) 5 MG TABS tablet Take 1 tablet (5 mg total) by mouth 2 (two) times daily. 11/20/23   Lanae Boast, MD  atorvastatin (LIPITOR) 40 MG tablet Take 1 tablet (40 mg total) by mouth daily. 11/21/23   Lanae Boast, MD  Calcium Carbonate-Vit D-Min (CALCIUM 600+D PLUS MINERALS) 600-400 MG-UNIT TABS Take 1 tablet by mouth in the morning.    [provider]  citalopram (CELEXA) 20 MG tablet Take 1 tablet (20 mg total) by mouth in the morning. 11/21/23   Lanae Boast, MD  diltiazem (CARDIZEM CD) 120 MG 24 hr capsule Take 1 capsule (120 mg total) by mouth daily. 02/12/23   Dunn, Tacey Ruiz, PA-C  docusate sodium (COLACE) 100 MG capsule Take 100 mg by mouth daily.    [provider]  furosemide (LASIX) 40 MG tablet Take 1 tablet (40 mg total) by mouth daily. 11/21/23   Lanae Boast, MD  hydrOXYzine (ATARAX/VISTARIL) 10 MG tablet Take 10 mg by mouth every 6 (six) hours as needed for anxiety. 05/31/20   [provider]  magnesium oxide (MAG-OX) 400 (240 Mg) MG tablet Take 400 mg by mouth daily.    [provider]  methocarbamol (ROBAXIN) 750 MG tablet TAKE 1 TABLET BY MOUTH 2 TIMES DAILY AS NEEDED FOR MUSCLE SPASMS (NON FORMULARY) 10/04/23   Pollyann Savoy, MD  Multiple Vitamins-Minerals (MULTIVITAMIN WITH MINERALS) tablet Take 1 tablet by mouth daily.    [provider]  ondansetron  (ZOFRAN-ODT) 4 MG disintegrating tablet Take 4 mg by mouth every 8 (eight) hours as needed. 11/09/23   [provider]  oxyCODONE (ROXICODONE) 5 MG immediate release tablet Take 1 tablet (5 mg total) by mouth every 4 (four) hours as needed for up to 5 doses for severe pain (pain score 7-10) or breakthrough pain. 11/20/23   Lanae Boast, MD  pantoprazole (PROTONIX) 20 MG tablet Take 20 mg by mouth daily. 11/09/23   [provider]  umeclidinium bromide (INCRUSE ELLIPTA) 62.5 MCG/ACT AEPB Inhale 1 puff into the lungs daily. 02/19/23   Waymon Budge, MD    Physical Exam:  BP (!) 84/55   Pulse 94   Temp 98 F (36.7 C) (Oral)   Resp 16   Ht 5' (1.524 m)   Wt 38.4 kg   SpO2 100%   BMI 16.53 kg/m   General: 73 y.o. year-old female well developed well nourished in no acute distress.  Alert and oriented x3. Cardiovascular: Regular rate and rhythm with no rubs or gallops.  No thyromegaly or JVD noted.  1+ pitting edema in lower extremities bilaterally.   Respiratory: Mild rales at bases.  Poor inspiratory effort. Abdomen: Soft nontender nondistended with normal bowel sounds x4 quadrants. Muskuloskeletal: No cyanosis or clubbing noted bilaterally Neuro: CN II-XII intact, strength, sensation, reflexes Skin: No ulcerative lesions noted or rashes Psychiatry: Judgement and insight appear normal. Mood is appropriate for condition and setting          Labs on Admission:  Basic Metabolic Panel: Recent Labs  Lab 11/30/23 1722 12/02/23 1449  NA 138 138  K 3.1* 3.9  CL 94* 95*  CO2 32 31  GLUCOSE 99 132*  BUN 21 23  CREATININE 0.83 0.75  CALCIUM 9.2 9.4  MG 1.8  --    Liver Function Tests: Recent Labs  Lab 11/30/23 1722 12/02/23 1449  AST 23 18  ALT 14 12  ALKPHOS 68 49  BILITOT 0.6 0.3  PROT 7.4 6.6  ALBUMIN 3.7 3.0*   Recent Labs  Lab 11/30/23 1722  LIPASE 40   No results for input(s): "AMMONIA" in the last 168 hours. CBC: Recent Labs  Lab 11/30/23 1722  12/02/23 1449  WBC 12.9* 10.7*  NEUTROABS 10.3*  --   HGB 10.4* 9.0*  HCT 32.6* 28.6*  MCV 89.3 89.7  PLT 448* 357   Cardiac Enzymes: No results for input(s): "CKTOTAL", "CKMB", "CKMBINDEX", "TROPONINI" in the last 168 hours.  BNP (last 3 results) Recent Labs    11/18/23 0446 11/30/23 1752 12/02/23 1449  BNP 341.9* 208.5* 746.7*    ProBNP (last 3 results) No results for input(s): "PROBNP" in the last 8760 hours.  CBG: No results for input(s): "GLUCAP" in the last 168 hours.  Radiological Exams on Admission: DG Chest Port 1 View Result Date: 12/02/2023 CLINICAL DATA:  Shortness of breath. EXAM: PORTABLE CHEST 1 VIEW COMPARISON:  11/30/2023. FINDINGS: Stable cardiomediastinal silhouette. Emphysematous changes again seen. Similar bibasilar atelectatic changes. No focal consolidation, sizeable effusion, or pneumothorax. Lower cervical fixation hardware. Bilateral breast prosthesis. No acute osseous abnormality. IMPRESSION: Emphysema.  Similar bibasilar atelectatic changes. Electronically Signed   By: Hart Robinsons M.D.   On: 12/02/2023 16:11   CT ABDOMEN PELVIS W CONTRAST Result Date: 11/30/2023 CLINICAL DATA:  Abdominal pain, acute, nonlocalized EXAM: CT ABDOMEN AND PELVIS WITH CONTRAST TECHNIQUE: Multidetector CT imaging of the abdomen and pelvis was performed using the standard protocol following bolus administration of intravenous contrast. RADIATION DOSE REDUCTION: This exam was performed according to the departmental dose-optimization program which includes automated exposure control, adjustment of the mA and/or kV according to patient size and/or use of iterative reconstruction technique. CONTRAST:  75mL OMNIPAQUE IOHEXOL 350 MG/ML SOLN COMPARISON:  11/15/2023 FINDINGS: Lower chest: Cardiomegaly, small pericardial effusion. No pleural effusions. Left base atelectasis. Hepatobiliary: No focal hepatic abnormality. Gallbladder unremarkable. Pancreas: No focal abnormality or ductal  dilatation. Spleen: No focal abnormality.  Normal size. Adrenals/Urinary Tract: Area of decreased perfusion in the mid to lower pole of the right kidney is similar to prior study concerning for pyelonephritis. Left kidney enhances normally. No stones or hydronephrosis. Adrenal glands  and urinary bladder unremarkable. Stomach/Bowel: Large stool burden throughout the colon. Stomach, large and small bowel grossly unremarkable. Vascular/Lymphatic: Aortic atherosclerosis. No evidence of aneurysm or adenopathy. Reproductive: Prior hysterectomy.  No adnexal masses. Other: No free fluid or free air. Musculoskeletal: No acute bony abnormality. IMPRESSION: Continued area of decreased perfusion in the mid to lower pole of the right kidney compatible with pyelonephritis. This is unchanged since recent study. Coronary artery disease, aortic atherosclerosis. Cardiomegaly. Large stool burden throughout the colon. Electronically Signed   By: Charlett Nose M.D.   On: 11/30/2023 22:20   CT Angio Chest PE W and/or Wo Contrast Result Date: 11/30/2023 CLINICAL DATA:  Pulmonary embolism (PE) suspected, high prob. Shortness of breath EXAM: CT ANGIOGRAPHY CHEST WITH CONTRAST TECHNIQUE: Multidetector CT imaging of the chest was performed using the standard protocol during bolus administration of intravenous contrast. Multiplanar CT image reconstructions and MIPs were obtained to evaluate the vascular anatomy. RADIATION DOSE REDUCTION: This exam was performed according to the departmental dose-optimization program which includes automated exposure control, adjustment of the mA and/or kV according to patient size and/or use of iterative reconstruction technique. CONTRAST:  75mL OMNIPAQUE IOHEXOL 350 MG/ML SOLN COMPARISON:  None Available. FINDINGS: Cardiovascular: Cardiomegaly. Coronary artery and aortic atherosclerosis. No evidence of aortic aneurysm. No filling defects in the pulmonary arteries to suggest pulmonary emboli.  Mediastinum/Nodes: Small pericardial effusion. No mediastinal, hilar, or axillary adenopathy. Trachea and esophagus are unremarkable. Thyroid unremarkable. Lungs/Pleura: Moderate emphysema. Compressive atelectasis in the left lower lobe. No confluent opacities or effusions. Upper Abdomen: No acute findings Musculoskeletal: Chest wall soft tissues are unremarkable. Bilateral breast implants grossly unremarkable. No acute bony abnormality. Review of the MIP images confirms the above findings. IMPRESSION: No evidence of pulmonary embolus. Cardiomegaly, coronary artery disease. No acute cardiopulmonary disease. Aortic Atherosclerosis (ICD10-I70.0) and Emphysema (ICD10-J43.9). Electronically Signed   By: Charlett Nose M.D.   On: 11/30/2023 22:18    EKG: I independently viewed the EKG done and my findings are as followed: Sinus rhythm rate of 68.  Nonspecific ST-T changes.  QTc 515.  Assessment/Plan Present on Admission:  Acute on chronic diastolic CHF (congestive heart failure) (HCC)  Principal Problem:   Acute on chronic diastolic CHF (congestive heart failure) (HCC) Active Problems:   Acute on chronic heart failure with preserved ejection fraction (HFpEF) (HCC)   Paroxysmal atrial fibrillation (HCC)   Severe mitral regurgitation   Pericardial effusion  Acute on chronic HFpEF Presented with BNP greater than 700, lower extremity edema, progressive dyspnea IV diuresing initiated in the ER Seen by cardiology Start strict I's and O's and daily weight Recent echo done on 11/16/2023 revealed LVEF 60 to 65% and no regional wall motion abnormalities.  Soft Bps Blood pressure borderline hypotensive Received a dose of midodrine 10 mg x 1 Maintain MAP greater than 65 Closely monitor vital signs while on diuretics  Paroxysmal A-fib/A-flutter on Eliquis Resume home Eliquis Rate is controlled Monitor on telemetry  Hyperlipidemia Resume Home Lipitor  Chronic anxiety/depression Resume home  Celexa  COPD on 3 L Resume home regimen Maintain O2 saturation above 90%  Moderate protein calorie malnutrition BMI 17 Albumin 3.0 Encourage increase oral protein calorie intake Dietitian consulted  Generalized weakness PT OT assessment Fall precautions   Time: 75 minutes.   DVT prophylaxis: Home Eliquis  Code Status: Full code.  Family Communication: None at bedside.  Disposition Plan: Admitted to progressive care unit.  Consults called: Cardiology.  Admission status: Inpatient status.   Status is: Inpatient The  patient requires at least 2 midnights for further evaluation and treatment of present condition.   Darlin Drop MD [facility] Pager [phone]  If 7PM-7AM, please contact night-coverage [email or web address] Password [facility]  12/02/2023, 9:53 PM

## 2023-12-02 NOTE — Telephone Encounter (Signed)
 Spoke with pt's daughter who states she is currently in a meeting and cannot speak with RN but will return call when available.

## 2023-12-02 NOTE — Consult Note (Addendum)
 Cardiology Consultation   Patient ID: [name] MRN: [id number]; DOB: [birth year]  Admit date: 12/02/2023 Date of Consult: 12/02/2023  PCP:  Melida Quitter, PA   [facility] Providers Cardiologist:  Rollene Rotunda, MD        Patient Profile:   [name] is a 73 y.o. female with a hx of non-ischemic cardiomyopathy and heart failure with recovered LVEF, severe mitral regurgitation, COPD on chronic home oxygen therapy, paroxysmal atrial fibrillation, atrial tachycardia, HLD, GERD, fibromyalgia who is being seen 12/02/2023 for the evaluation of acute on chronic heart failure with preserved EF at the request of the emergency department.  History of Present Illness:   [name] presents with chief concern of fatigue, shortness of breath on exertion and orthopnea.  She may have some abdominal fullness.  She presents from [place] facility.  All of her medications are given to her by a nursing staff.  She is not sure what medications she is taking.  Unsure if she is getting her diuretic.  She notes low blood pressures at her living facility in addition to her fatigue.  She walked to the restroom earlier today without her oxygen supplementation which resulted in severe fatigue and prompted her ER presentation.   She denies any chest pain, lightheadedness.  No lower extremity edema.    Afebrile upon arrival to the ER.  BP soft with measurements of 84/55, 88/53.  Oxygen saturation 100% on her nasal cannula supplementation.  Labwork notable for normal serum creatinine, BNP 746, and trop 18.  WBC 10.7, hgb 9.0.  CXR with emphysema.  No notable pleural effusions.    Cardiology consulted with concern that she could have a component of acute on chronic HFpEF.  Patient was given 20 mg IV lasix and will be admitted to the medicine service for further evaluation and management.   Past Medical History:  Diagnosis Date   Anemia    B12 deficient   Arthritis    B12 deficiency     Breast cancer (HCC)    left breast.   Chronic SI joint pain    Bilateral, Dr. Corliss Skains   COPD (chronic obstructive pulmonary disease) (HCC)    Fibromyalgia    Dr. Corliss Skains   GERD (gastroesophageal reflux disease)    Greater trochanteric bursitis of both hips    Dr. Corliss Skains   Hyperlipidemia    IBS (irritable bowel syndrome)    LV dysfunction    iatrogenic from chemotherapy ; on Carvedilol    Migraines    Osteoporosis    Dr Lomax---> transferring to a new gyn   Oxygen dependent    3 liters    Past Surgical History:  Procedure Laterality Date   ABDOMINAL HYSTERECTOMY     BSO for  Endometriosis   CARDIOVERSION N/A 11/16/2023   Procedure: CARDIOVERSION;  Surgeon: Thomasene Ripple, DO;  Location: [facility] IN[facility]ASIVE [facility];  Service: Cardiovascular;  Laterality: N/A;   CARPAL TUNNEL RELEASE     left   CERVICAL DISC SURGERY     COLONOSCOPY W/ POLYPECTOMY  1999   Dr Kinnie Scales   ESOPHAGEAL DILATION  2005   Dr Kinnie Scales   MASTECTOMY  2009   bilateral, Dr Jamey Ripa   ORIF ANKLE FRACTURE Right 10/12/2023   Procedure: RIGHT OPEN REDUCTION INTERNAL FIXATION (ORIF) ANKLE FRACTURE;  Surgeon: Huel Cote, MD;  Location: [facility] OR;  Service: Orthopedics;  Laterality: Right;   SEPTOPLASTY     TRANSESOPHAGEAL ECHOCARDIOGRAM (CATH LAB) N/A 11/16/2023   Procedure:  TRANSESOPHAGEAL ECHOCARDIOGRAM;  Surgeon: Thomasene Ripple, DO;  Location: [facility] INV[facility] CV [facility];  Service: Cardiovascular;  Laterality: N/A;     Home Medications:  Prior to Admission medications   Medication Sig Start Date End Date Taking? Authorizing Provider  acetaminophen (TYLENOL) 500 MG tablet Take 1,000 mg by mouth every 6 (six) hours as needed for moderate pain (pain score 4-6) or mild pain (pain score 1-3).    [provider]  albuterol (VENTOLIN HFA) 108 (90 Base) MCG/ACT inhaler Inhale 2 puffs every 6 hours as needed 02/19/23   Jetty Duhamel D, MD  amiodarone (PACERONE) 200 MG tablet Take 1 tablet (200 mg total) by mouth daily. 11/24/23    Robbie Lis M, PA-C  apixaban (ELIQUIS) 5 MG TABS tablet Take 1 tablet (5 mg total) by mouth 2 (two) times daily. 11/20/23   Lanae Boast, MD  atorvastatin (LIPITOR) 40 MG tablet Take 1 tablet (40 mg total) by mouth daily. 11/21/23   Lanae Boast, MD  Calcium Carbonate-Vit D-Min (CALCIUM 600+D PLUS MINERALS) 600-400 MG-UNIT TABS Take 1 tablet by mouth in the morning.    [provider]  citalopram (CELEXA) 20 MG tablet Take 1 tablet (20 mg total) by mouth in the morning. 11/21/23   Lanae Boast, MD  diltiazem (CARDIZEM CD) 120 MG 24 hr capsule Take 1 capsule (120 mg total) by mouth daily. 02/12/23   Dunn, Tacey Ruiz, PA-C  docusate sodium (COLACE) 100 MG capsule Take 100 mg by mouth daily.    [provider]  furosemide (LASIX) 40 MG tablet Take 1 tablet (40 mg total) by mouth daily. 11/21/23   Lanae Boast, MD  hydrOXYzine (ATARAX/VISTARIL) 10 MG tablet Take 10 mg by mouth every 6 (six) hours as needed for anxiety. 05/31/20   [provider]  magnesium oxide (MAG-OX) 400 (240 Mg) MG tablet Take 400 mg by mouth daily.    [provider]  methocarbamol (ROBAXIN) 750 MG tablet TAKE 1 TABLET BY MOUTH 2 TIMES DAILY AS NEEDED FOR MUSCLE SPASMS (NON FORMULARY) 10/04/23   Pollyann Savoy, MD  Multiple Vitamins-Minerals (MULTIVITAMIN WITH MINERALS) tablet Take 1 tablet by mouth daily.    [provider]  ondansetron (ZOFRAN-ODT) 4 MG disintegrating tablet Take 4 mg by mouth every 8 (eight) hours as needed. 11/09/23   [provider]  oxyCODONE (ROXICODONE) 5 MG immediate release tablet Take 1 tablet (5 mg total) by mouth every 4 (four) hours as needed for up to 5 doses for severe pain (pain score 7-10) or breakthrough pain. 11/20/23   Lanae Boast, MD  pantoprazole (PROTONIX) 20 MG tablet Take 20 mg by mouth daily. 11/09/23   [provider]  umeclidinium bromide (INCRUSE ELLIPTA) 62.5 MCG/ACT AEPB Inhale 1 puff into the lungs daily. 02/19/23   Waymon Budge,  MD    Inpatient Medications: Scheduled Meds:   Continuous Infusions:  PRN Meds:   Allergies:    Allergies  Allergen Reactions   Amoxicillin-Pot Clavulanate     diarrhea   Doxycycline     Nausea & vomiting   Venlafaxine     ? Reaction; ? Blurred vision    Social History:   Social History   Socioeconomic History   Marital status: Widowed    Spouse name: Not on file   Number of children: 1   Years of education: Not on file   Highest education level: 12th grade  Occupational History   Occupation: retired, Warehouse manager   Tobacco Use   Smoking status: Former  Current packs/day: 0.00    Average packs/day: 1 pack/day for 30.0 years (30.0 ttl pk-yrs)    Types: Cigarettes    Start date: 08/31/1960    Quit date: 08/31/1990    Years since quitting: 33.2    Passive exposure: Never   Smokeless tobacco: Never  Vaping Use   Vaping status: Never Used  Substance and Sexual Activity   Alcohol use: No   Drug use: Not Currently    Types: Marijuana    Comment: 08/01/2017 last used    Sexual activity: Not Currently  Other Topics Concern   Not on file  Social History Narrative   Lives w/ husband   Social Drivers of Health   Financial Resource Strain: Low Risk  (11/17/2023)   Overall Financial Resource Strain (CARDIA)    Difficulty of Paying Living Expenses: Not hard at all  Food Insecurity: Food Insecurity Present (11/14/2023)   Hunger Vital Sign    Worried About Running Out of Food in the Last Year: Sometimes true    Ran Out of Food in the Last Year: Sometimes true  Transportation Needs: No Transportation Needs (11/17/2023)   PRAPARE - Administrator, Civil Service (Medical): No    Lack of Transportation (Non-Medical): No  Physical Activity: Inactive (05/11/2023)   Exercise Vital Sign    Days of Exercise per Week: 0 days    Minutes of Exercise per Session: 0 min  Stress: No Stress Concern Present (05/11/2023)   [employer] of Occupational Health - Occupational  Stress Questionnaire    Feeling of Stress : Not at all  Social Connections: Socially Isolated (11/14/2023)   Social Connection and Isolation Panel [NHANES]    Frequency of Communication with Friends and Family: More than three times a week    Frequency of Social Gatherings with Friends and Family: More than three times a week    Attends Religious Services: Never    Database administrator or Organizations: No    Attends Banker Meetings: Never    Marital Status: Widowed  Intimate Partner Violence: Not At Risk (11/14/2023)   Humiliation, Afraid, Rape, and Kick questionnaire    Fear of Current or Ex-Partner: No    Emotionally Abused: No    Physically Abused: No    Sexually Abused: No    Family History:    Family History  Problem Relation Age of Onset   Diabetes Mother    Breast cancer Mother    Depression Mother        anxiety   Heart disease Mother        in her 50s   Colon cancer Sister    Healthy Daughter    Stroke Neg Hx      ROS:  Please see the history of present illness.   All other ROS reviewed and negative.     Physical Exam/Data:   Vitals:   12/02/23 2030 12/02/23 2037 12/02/23 2045 12/02/23 2100  BP: (!) 90/51  (!) 82/52 (!) 84/55  Pulse: 72  70 94  Resp: 16  15 16   Temp:  98 F (36.7 C)    TempSrc:  Oral    SpO2: 100%  100% 100%  Weight:      Height:        Intake/Output Summary (Last 24 hours) at 12/02/2023 2147 Last data filed at 12/02/2023 2004 Gross per 24 hour  Intake 500 ml  Output --  Net 500 ml      12/02/2023  1:55 PM 11/24/2023    2:54 PM 11/22/2023    5:07 AM  Last 3 Weights  Weight (lbs) 84 lb 10.5 oz 84 lb 9.6 oz 83 lb 11.2 oz  Weight (kg) 38.4 kg 38.374 kg 37.966 kg     Body mass index is 16.53 kg/m.  General:  elderly appearing woman, frail, NAD  HEENT: normal Neck: JVP mid neck at 60 degrees  Vascular: Distal pulses 2+ bilaterally Cardiac:  normal S1, S2; RRR, distant heart sounds, no appreciable murmur/r/g   Lungs:  bilateral crackles present throughout the posterior lungs  Abd: soft, nontender, no hepatomegaly  Ext: no edema Musculoskeletal:  No deformities, BUE and BLE strength normal and equal Skin: warm and dry  Neuro:  CNs 2-12 intact, no focal abnormalities noted Psych:  Normal affect   EKG:  The EKG was personally reviewed and demonstrates:  NSR, nonspecific ST T wave changes  Telemetry:  Telemetry was personally reviewed and demonstrates:  NSR  Relevant CV Studies: Echo 11/15/2023 1. Left ventricular ejection fraction, by estimation, is 60 to 65%. The  left ventricle has normal function. The left ventricle has no regional  wall motion abnormalities. Left ventricular diastolic function could not  be evaluated.   2. Right ventricular systolic function is normal. The right ventricular  size is normal. There is normal pulmonary artery systolic pressure. The  estimated right ventricular systolic pressure is 35.8 mmHg.   3. Left atrial size was severely dilated.   4. Right atrial size was severely dilated.   5. Moderate pericardial effusion. The pericardial effusion is posterior  to the left ventricle. There is no evidence of cardiac tamponade.   6. The mitral valve is abnormal. Severe mitral valve regurgitation. No  evidence of mitral stenosis.   7. The aortic valve is tricuspid. There is mild calcification of the  aortic valve. Aortic valve regurgitation is not visualized. Aortic valve  sclerosis/calcification is present, without any evidence of aortic  stenosis.   8. Aortic dilatation noted. There is mild dilatation of the ascending  aorta, measuring 41 mm.   9. The inferior vena cava is dilated in size with <50% respiratory  variability, suggesting right atrial pressure of 15 mmHg.   Laboratory Data:  High Sensitivity Troponin:   Recent Labs  Lab 11/30/23 1722 11/30/23 2046 12/02/23 1449  TROPONINIHS 6 7 18*     Chemistry Recent Labs  Lab 11/30/23 1722  12/02/23 1449  NA 138 138  K 3.1* 3.9  CL 94* 95*  CO2 32 31  GLUCOSE 99 132*  BUN 21 23  CREATININE 0.83 0.75  CALCIUM 9.2 9.4  MG 1.8  --   GFRNONAA >60 >60  ANIONGAP 12 12    Recent Labs  Lab 11/30/23 1722 12/02/23 1449  PROT 7.4 6.6  ALBUMIN 3.7 3.0*  AST 23 18  ALT 14 12  ALKPHOS 68 49  BILITOT 0.6 0.3   Lipids No results for input(s): "CHOL", "TRIG", "HDL", "LABVLDL", "LDLCALC", "CHOLHDL" in the last 168 hours.  Hematology Recent Labs  Lab 11/30/23 1722 12/02/23 1449  WBC 12.9* 10.7*  RBC 3.65* 3.19*  HGB 10.4* 9.0*  HCT 32.6* 28.6*  MCV 89.3 89.7  MCH 28.5 28.2  MCHC 31.9 31.5  RDW 14.9 14.8  PLT 448* 357   Thyroid No results for input(s): "TSH", "FREET4" in the last 168 hours.  BNP Recent Labs  Lab 11/30/23 1752 12/02/23 1449  BNP 208.5* 746.7*    DDimer No results for input(s): "DDIMER"  in the last 168 hours.   Radiology/Studies:  DG Chest Port 1 View Result Date: 12/02/2023 CLINICAL DATA:  Shortness of breath. EXAM: PORTABLE CHEST 1 VIEW COMPARISON:  11/30/2023. FINDINGS: Stable cardiomediastinal silhouette. Emphysematous changes again seen. Similar bibasilar atelectatic changes. No focal consolidation, sizeable effusion, or pneumothorax. Lower cervical fixation hardware. Bilateral breast prosthesis. No acute osseous abnormality. IMPRESSION: Emphysema.  Similar bibasilar atelectatic changes. Electronically Signed   By: Hart Robinsons M.D.   On: 12/02/2023 16:11   CT ABDOMEN PELVIS W CONTRAST Result Date: 11/30/2023 CLINICAL DATA:  Abdominal pain, acute, nonlocalized EXAM: CT ABDOMEN AND PELVIS WITH CONTRAST TECHNIQUE: Multidetector CT imaging of the abdomen and pelvis was performed using the standard protocol following bolus administration of intravenous contrast. RADIATION DOSE REDUCTION: This exam was performed according to the departmental dose-optimization program which includes automated exposure control, adjustment of the mA and/or kV according  to patient size and/or use of iterative reconstruction technique. CONTRAST:  75mL OMNIPAQUE IOHEXOL 350 MG/ML SOLN COMPARISON:  11/15/2023 FINDINGS: Lower chest: Cardiomegaly, small pericardial effusion. No pleural effusions. Left base atelectasis. Hepatobiliary: No focal hepatic abnormality. Gallbladder unremarkable. Pancreas: No focal abnormality or ductal dilatation. Spleen: No focal abnormality.  Normal size. Adrenals/Urinary Tract: Area of decreased perfusion in the mid to lower pole of the right kidney is similar to prior study concerning for pyelonephritis. Left kidney enhances normally. No stones or hydronephrosis. Adrenal glands and urinary bladder unremarkable. Stomach/Bowel: Large stool burden throughout the colon. Stomach, large and small bowel grossly unremarkable. Vascular/Lymphatic: Aortic atherosclerosis. No evidence of aneurysm or adenopathy. Reproductive: Prior hysterectomy.  No adnexal masses. Other: No free fluid or free air. Musculoskeletal: No acute bony abnormality. IMPRESSION: Continued area of decreased perfusion in the mid to lower pole of the right kidney compatible with pyelonephritis. This is unchanged since recent study. Coronary artery disease, aortic atherosclerosis. Cardiomegaly. Large stool burden throughout the colon. Electronically Signed   By: Charlett Nose M.D.   On: 11/30/2023 22:20   CT Angio Chest PE W and/or Wo Contrast Result Date: 11/30/2023 CLINICAL DATA:  Pulmonary embolism (PE) suspected, high prob. Shortness of breath EXAM: CT ANGIOGRAPHY CHEST WITH CONTRAST TECHNIQUE: Multidetector CT imaging of the chest was performed using the standard protocol during bolus administration of intravenous contrast. Multiplanar CT image reconstructions and MIPs were obtained to evaluate the vascular anatomy. RADIATION DOSE REDUCTION: This exam was performed according to the departmental dose-optimization program which includes automated exposure control, adjustment of the mA and/or kV  according to patient size and/or use of iterative reconstruction technique. CONTRAST:  75mL OMNIPAQUE IOHEXOL 350 MG/ML SOLN COMPARISON:  None Available. FINDINGS: Cardiovascular: Cardiomegaly. Coronary artery and aortic atherosclerosis. No evidence of aortic aneurysm. No filling defects in the pulmonary arteries to suggest pulmonary emboli. Mediastinum/Nodes: Small pericardial effusion. No mediastinal, hilar, or axillary adenopathy. Trachea and esophagus are unremarkable. Thyroid unremarkable. Lungs/Pleura: Moderate emphysema. Compressive atelectasis in the left lower lobe. No confluent opacities or effusions. Upper Abdomen: No acute findings Musculoskeletal: Chest wall soft tissues are unremarkable. Bilateral breast implants grossly unremarkable. No acute bony abnormality. Review of the MIP images confirms the above findings. IMPRESSION: No evidence of pulmonary embolus. Cardiomegaly, coronary artery disease. No acute cardiopulmonary disease. Aortic Atherosclerosis (ICD10-I70.0) and Emphysema (ICD10-J43.9). Electronically Signed   By: Charlett Nose M.D.   On: 11/30/2023 22:18   DG Chest 2 View Result Date: 11/30/2023 CLINICAL DATA:  Shortness of breath EXAM: CHEST - 2 VIEW COMPARISON:  X-ray 11/17/2023.  CTA 11/14/2023 FINDINGS: Hyperinflation. Stable cardiopericardial silhouette.  Improving left retrocardiac opacity. No pneumothorax or effusion. No edema. Surgical clips seen along the left axillary region and right chest wall. Fixation hardware along the lower cervical spine. Degenerative changes of the spine. Osteopenia. Breast implants identified. IMPRESSION: Hyperinflation with chronic changes. Improving left retrocardiac opacity Electronically Signed   By: Karen Kays M.D.   On: 11/30/2023 19:02     Assessment and Plan:   1. Acute on chronic HFpEF - appears mildly volume overloaded on exam by her JVPs - gentle diuresis given soft BP with 20 mg IV lasix - goal net negative 1 liter next 24 hours as BP  tolerates  - daily standing weights  - fluid restrict 2 liters  - would hold on SGLT2i or MRA in the setting of soft BP. Can consider starting after diuresis.   2. Moderate pericardial effusion - recommend repeat limited TTE to reassess effusion in the setting of her presentation and soft blood pressure    3. Severe MR  - volume removal as noted above  - she is being considered for outpatient mitraclip   4. Paroxysmal atrial fibrillation - currently in normal sinus rhythm  - continue amiodarone 200 mg daily  - okay to hold diltiazem 120 mg daily given soft BP  - continue apixaban 5 mg BID    Risk Assessment/Risk Scores:        New York Heart Association (NYHA) Functional Class NYHA Class III        For questions or updates, please contact [facility] Please consult [email or web address] for contact info under    Signed, Sharyon Medicus, MD  12/02/2023 9:47 PM

## 2023-12-02 NOTE — ED Provider Notes (Signed)
 Assumed care from Dr. Maple Hudson at 5 PM.  Patient with a history of COPD on 3 L nasal cannula at baseline who is in assisted living presenting today with shortness of breath and generalized weakness.  She reports the generalized weakness has been for the past week but then she got up and went to the bathroom today without her oxygen on and felt unwell and was noted to be hypoxic and tachycardic.  Oxygen was replaced and she was given a breathing treatment and by the time she arrived here her symptoms were improved.  She is currently 100% on her baseline 3 L.  She has no specific complaints at this time.  Patient's blood pressure is borderline however that appears to be chronic in the 90's with recent admission to the hospital for atrial tachycardia and discharged over a week ago but had new medications started. And pt reports her BP has been getting lower all week and here BP initially in the 90's but now in the 80's consistent and pt reports eating and drinking normally.  She reports feeling dizzy any time she gets up and even sometimes when she is in the bed.  Now back a second time this week.  But when she was here on 11/30/23 had a CTA that showed no signs of PE or AAA.  She has mild edema bilaterally and will check BNP and trop. Other  Labs today are reassuring and UA without acute findings. 9:14 PM Troponin is mildly elevated today from her visit 2 days ago and is now 18 from 6 and BNP has increased back to 746 but she remains having low blood pressures.  EF was preserved on her last echo however she had severe MR, biatrial enlargement and appears to need diuresis.  Will consult cardiology.  9:14 PM Spoke with cardiology who felt that 20mg  IV lasix will be ok for now and they will come see her.  Also will admit to the hospitalist service.  Pt is comfortable with this plan.  CRITICAL CARE Performed by: Deantae Shackleton Total critical care time: 30 minutes Critical care time was exclusive of separately  billable procedures and treating other patients. Critical care was necessary to treat or prevent imminent or life-threatening deterioration. Critical care was time spent personally by me on the following activities: development of treatment plan with patient and/or surrogate as well as nursing, discussions with consultants, evaluation of patient's response to treatment, examination of patient, obtaining history from patient or surrogate, ordering and performing treatments and interventions, ordering and review of laboratory studies, ordering and review of radiographic studies, pulse oximetry and re-evaluation of patient's condition.    Gwyneth Sprout, MD 12/02/23 2114

## 2023-12-02 NOTE — ED Triage Notes (Signed)
 Pt BIB EMS from [facility] for SOB.  Pt removed Baseline 3lnc to walk to bathroom and staff found her with SPO2 87% and HR 150. Placed back on O2 and gave albuterol with no improvement per facility. Pt stated that she feels better, just tired. VS EMS 100% 3LNC, BP 96/60, HR76 GCS 15.

## 2023-12-02 NOTE — Telephone Encounter (Signed)
 Pt daughter called in asking if Dr. Excell Seltzer is able to address some other issues that pt has been having (CP, seen at the ED Tuesday) at her consult tomorrow. Please advise.

## 2023-12-02 NOTE — Telephone Encounter (Signed)
 Spoke with pt's daughter,DPR who states she would like for Dr Excell Seltzer to address additional concerns of pt's episodes of low BP and low O2 sat.  Pt currently at [facility] and is being followed by NP there.  Pt's daughter advised Dr Excell Seltzer will thoroughly evaluate pt tomorrow and daughter can discuss her concerns during OV.  Pt's daughter verbalizes understanding and thanked Charity fundraiser for the callback.

## 2023-12-03 ENCOUNTER — Ambulatory Visit: Admitting: Cardiovascular Disease

## 2023-12-03 ENCOUNTER — Telehealth: Payer: Self-pay | Admitting: Cardiology

## 2023-12-03 DIAGNOSIS — I340 Nonrheumatic mitral (valve) insufficiency: Secondary | ICD-10-CM

## 2023-12-03 DIAGNOSIS — I3139 Other pericardial effusion (noninflammatory): Secondary | ICD-10-CM

## 2023-12-03 DIAGNOSIS — I5033 Acute on chronic diastolic (congestive) heart failure: Secondary | ICD-10-CM | POA: Diagnosis not present

## 2023-12-03 DIAGNOSIS — I480 Paroxysmal atrial fibrillation: Secondary | ICD-10-CM

## 2023-12-03 LAB — BASIC METABOLIC PANEL WITH GFR
Anion gap: 11 (ref 5–15)
BUN: 24 mg/dL — ABNORMAL HIGH (ref 8–23)
CO2: 33 mmol/L — ABNORMAL HIGH (ref 22–32)
Calcium: 9.1 mg/dL (ref 8.9–10.3)
Chloride: 95 mmol/L — ABNORMAL LOW (ref 98–111)
Creatinine, Ser: 0.85 mg/dL (ref 0.44–1.00)
GFR, Estimated: >60 mL/min (ref >60)
Glucose, Bld: 88 mg/dL (ref 70–99)
Potassium: 3.4 mmol/L — ABNORMAL LOW (ref 3.5–5.1)
Sodium: 139 mmol/L (ref 135–145)

## 2023-12-03 LAB — CBC
HCT: 26.6 % — ABNORMAL LOW (ref 36.0–46.0)
Hemoglobin: 8.2 g/dL — ABNORMAL LOW (ref 12.0–15.0)
MCH: 27.6 pg (ref 26.0–34.0)
MCHC: 30.8 g/dL (ref 30.0–36.0)
MCV: 89.6 fL (ref 80.0–100.0)
Platelets: 397 10*3/uL (ref 150–400)
RBC: 2.97 MIL/uL — ABNORMAL LOW (ref 3.87–5.11)
RDW: 15 % (ref 11.5–15.5)
WBC: 8.2 10*3/uL (ref 4.0–10.5)
nRBC: 0 % (ref 0.0–0.2)

## 2023-12-03 LAB — MAGNESIUM: Magnesium: 2 mg/dL (ref 1.7–2.4)

## 2023-12-03 LAB — PHOSPHORUS: Phosphorus: 2.5 mg/dL (ref 2.5–4.6)

## 2023-12-03 MED ORDER — MIDODRINE HCL 5 MG PO TABS
10.0000 mg | ORAL_TABLET | ORAL | Status: AC
  Administered 2023-12-03: 10 mg via ORAL
  Filled 2023-12-03: qty 2

## 2023-12-03 MED ORDER — ALBUTEROL SULFATE (2.5 MG/3ML) 0.083% IN NEBU
3.0000 mL | INHALATION_SOLUTION | Freq: Four times a day (QID) | RESPIRATORY_TRACT | Status: DC | PRN
  Administered 2023-12-03 – 2023-12-04 (×2): 3 mL via RESPIRATORY_TRACT
  Filled 2023-12-03 (×3): qty 3

## 2023-12-03 MED ORDER — ALBUMIN HUMAN 25 % IV SOLN
12.5000 g | Freq: Once | INTRAVENOUS | Status: AC
  Administered 2023-12-03: 12.5 g via INTRAVENOUS
  Filled 2023-12-03: qty 50

## 2023-12-03 MED ORDER — MIDODRINE HCL 5 MG PO TABS
10.0000 mg | ORAL_TABLET | Freq: Three times a day (TID) | ORAL | Status: DC
  Administered 2023-12-03 – 2023-12-06 (×9): 10 mg via ORAL
  Filled 2023-12-03 (×9): qty 2

## 2023-12-03 MED ORDER — UMECLIDINIUM BROMIDE 62.5 MCG/ACT IN AEPB
1.0000 | INHALATION_SPRAY | Freq: Every day | RESPIRATORY_TRACT | Status: DC
  Administered 2023-12-04: 1 via RESPIRATORY_TRACT
  Filled 2023-12-03: qty 7

## 2023-12-03 NOTE — Progress Notes (Signed)
   12/03/23 0953  Assess: MEWS Score  Temp 98 F (36.7 C)  BP (!) 74/48  MAP (mmHg) (!) 57  Pulse Rate 70  ECG Heart Rate 71  Resp 20  Level of Consciousness Alert  SpO2 99 %  O2 Device Nasal Cannula  O2 Flow Rate (L/min) 3 L/min  Assess: MEWS Score  MEWS Temp 0  MEWS Systolic 2  MEWS Pulse 0  MEWS RR 0  MEWS LOC 0  MEWS Score 2  MEWS Score Color Yellow  Assess: if the MEWS score is Yellow or Red  Were vital signs accurate and taken at a resting state? Yes  Does the patient meet 2 or more of the SIRS criteria? No  MEWS guidelines implemented  No, previously yellow, continue vital signs every 4 hours  Notify: Charge Nurse/RN  Name of Charge Nurse/RN Notified Bryan  Assess: SIRS CRITERIA  SIRS Temperature  0  SIRS Respirations  0  SIRS Pulse 0  SIRS WBC 0  SIRS Score Sum  0

## 2023-12-03 NOTE — Telephone Encounter (Signed)
 Daughter [name]) wants a call back to discuss next steps as patient has been admitted to hospital again.

## 2023-12-03 NOTE — Progress Notes (Signed)
   12/03/23 0748  Assess: MEWS Score  BP (!) 80/55  MAP (mmHg) 65  Pulse Rate 73  ECG Heart Rate 73  Resp 20  SpO2 96 %  O2 Device Nasal Cannula  Assess: MEWS Score  MEWS Temp 0  MEWS Systolic 2  MEWS Pulse 0  MEWS RR 0  MEWS LOC 0  MEWS Score 2  MEWS Score Color Yellow  Assess: if the MEWS score is Yellow or Red  Were vital signs accurate and taken at a resting state? Yes  Does the patient meet 2 or more of the SIRS criteria? No  MEWS guidelines implemented  No, previously yellow, continue vital signs every 4 hours  Notify: Charge Nurse/RN  Name of Charge Nurse/RN Notified Bryan  Assess: SIRS CRITERIA  SIRS Temperature  0  SIRS Respirations  0  SIRS Pulse 0  SIRS WBC 0  SIRS Score Sum  0

## 2023-12-03 NOTE — Plan of Care (Signed)

## 2023-12-03 NOTE — Plan of Care (Signed)

## 2023-12-03 NOTE — Progress Notes (Addendum)
 PROGRESS NOTE    [name]  JYN:[id number] DOB: [birth year] DOA: 12/02/2023 PCP: Melida Quitter, PA   Brief Narrative:  [name] is a 73 y.o. female with medical history significant for COPD on 3 L nasal cannula continuously, nonischemic cardiomyopathy, hyperlipidemia, GERD, recent bimalleolar ankle fracture after fall, paroxysmal A-fib/A-flutter on Eliquis, history of severe MR, moderate pericardial effusion, chronic HFrEF, who presents to the ER via EMS from SNF due to worsening shortness of breath.  Associated with generalized weakness and intermittent palpitations.  No reported chest pain.   In the ER, cardiomegaly with concern for edema on chest x-ray.  BNP elevated greater than 700.  Bilateral lower extremity edema.  The patient received IV Lasix in the ER.  EDP discussed the case with cardiology who recommended medicine admission for further diuresis and management of acute on chronic HFpEF.  Admitted by [facility], hospitalist service.   **Interim History Diuresis was held given her low blood pressure so she is been initiated on midodrine and will attempt diuresis if BP improves.  PT OT recommended SNF.  Cardiology following and will get the structural heart team to evaluate.  Assessment and Plan:  Acute on Chronic Diastolic HFpEF / Sever MR : Presented with BNP of 746.7, lower extremity edema, progressive dyspnea. IV diuresing initiated in the ER but held now due to BP. Seen by Cardiology and getting IV Albumin and Midodrine. Start strict I's and O's and daily weight. Recent echo done on 11/16/2023 revealed LVEF 60 to 65% and no regional wall motion abnormalities.  Will increase diuretics today if able but currently being held.  Per cardiology she is a procedural candidate for MTEER with posterior restriction and mild aortic dilatation we will due to off axis images.  They are recommending structural heart team to evaluate and considering palliative care consultation for goals of  care discussion. PT/OT recommending SNF   Hypotension: Hold Antihypertensives. Received a dose of midodrine 10 mg x 1 so will resume 10 mg TID. Given IV Albumin 12.5 grams x1 Maintain MAP greater than 65. Closely monitor vital signs while on diuretics (held for now).    Persistent A-fib/A-flutter on Eliquis: Resume home Apixaban 5 mg po BID; Rate is controlled. CTM on Telemetry. Holding Antihypertensives and beta-blocking and AV nodal blocking agents given Soft BP  Dysphagia: Having trouble swallowing pills per report. SLP evaluated and now recommending Regular Diet with Thin Liquids.   Hypokalemia: K+ is now 3.4. Replete with po KCL 40 mEQ BID. CTM and Replete as Necessary. Repeat CMP in the AM   Hyperlipidemia: C/w Atorvastatin 40 mg po Daily   Chronic Anxiety/Depression: C/w Citalopram 20 mg po Dally    COPD on 3 L: Resume home regimen Umeclidinium Bromide 62.5 mcg/act 1 puff IH Daily; C/w Albuterol 3 mL IH q6hprn Wheezing and SOB. Maintain O2 saturation above 90%. SpO2: 95 % O2 Flow Rate (L/min): 3 L/min. Repeat CXR in the AM  Normocytic Anemia: Hgb/Hct went from 9.0/28.6 -> 8.2/26.6. Check Anemia Panel in the AM. CTM for S/Sx of Bleeding; No overt bleeding noted. Repeat CBC in the AM   Generalized Weakness and Deconditioning: PT/OT recommending SNF. Fall precautions  Hypoalbuminemia: Patient's Albumin Level went from 3.7 -> 3.0. CTM and Trend and repeat CMP in the AM  Severe Protein Calorie Malnutrition/ Underweight and failure to thrive: Nutrition Status- Nutrition Problem: Severe Malnutrition Etiology: chronic illness (COPD, HFpEF) Signs/Symptoms: moderate muscle depletion, severe fat depletion, severe muscle depletion Interventions: Magic cup, Liberalize Diet,  Refer to RD note for recommendations Estimated body mass index is 17.22 kg/m as calculated from the following:   Height as of this encounter: 5' (1.524 m).   Weight as of this encounter: 40 kg.   DVT prophylaxis:   apixaban (ELIQUIS) tablet 5 mg    Code Status: Full Code Family Communication: D/w family present at bedside  Disposition Plan:  Level of care: Progressive Status is: Inpatient Remains inpatient appropriate because: Needs further clinical improvement and clearance by Cardiology   Consultants:  Cardiology Palliative Care Medicine  Procedures:  As delineated as above  Antimicrobials:  Anti-infectives (From admission, onward)    None       Subjective: Examined at bedside she is still feeling little short of breath.  Felt weak and fatigued and blood pressures on the lower side to morning.  No nausea or vomiting.  No other concerns or complaints this time.  Objective: Vitals:   12/03/23 1146 12/03/23 1200 12/03/23 1531 12/03/23 1611  BP:    122/68  Pulse: 68 74 79 86  Resp: 20 16 19 20   Temp:    97.6 F (36.4 C)  TempSrc:    Oral  SpO2: 100% 97% 99% 95%  Weight:      Height:        Intake/Output Summary (Last 24 hours) at 12/03/2023 1744 Last data filed at 12/03/2023 1610 Gross per 24 hour  Intake 951.8 ml  Output 300 ml  Net 651.8 ml   Filed Weights   12/02/23 1355 12/03/23 0322  Weight: 38.4 kg 40 kg   Examination: Physical Exam:  Constitutional: Thin elderly Caucasian female who Respiratory: Diminished to auscultation bilaterally with some coarse breath sounds and does have some slight crackles but no appreciable wheezing or rales. Normal respiratory effort and patient is not tachypenic. No accessory muscle use.  Wearing supplemental oxygen nasal cannula Cardiovascular: RRR, has a systolic murmur noted. No appreciable extremity edema.  Abdomen: Soft, non-tender, non-distended. Bowel sounds positive.  GU: Deferred. Musculoskeletal: No clubbing / cyanosis of digits/nails. No joint deformity upper and lower extremities.  Skin: No rashes, lesions, ulcers on a limited skin evaluation. No induration; Warm and dry.  Neurologic: CN 2-12 grossly intact with no focal  deficits. Romberg and cerebellar reflexes not assessed.  Psychiatric: Normal judgment and insight. Alert and oriented x 3. Normal mood and appropriate affect.   Data Reviewed: I have personally reviewed following labs and imaging studies  CBC: Recent Labs  Lab 11/30/23 1722 12/02/23 1449 12/03/23 1037  WBC 12.9* 10.7* 8.2  NEUTROABS 10.3*  --   --   HGB 10.4* 9.0* 8.2*  HCT 32.6* 28.6* 26.6*  MCV 89.3 89.7 89.6  PLT 448* 357 397   Basic Metabolic Panel: Recent Labs  Lab 11/30/23 1722 12/02/23 1449 12/03/23 1037  NA 138 138 139  K 3.1* 3.9 3.4*  CL 94* 95* 95*  CO2 32 31 33*  GLUCOSE 99 132* 88  BUN 21 23 24*  CREATININE 0.83 0.75 0.85  CALCIUM 9.2 9.4 9.1  MG 1.8  --  2.0  PHOS  --   --  2.5   GFR: Estimated Creatinine Clearance: 37.2 mL/min (by C-G formula based on SCr of 0.85 mg/dL). Liver Function Tests: Recent Labs  Lab 11/30/23 1722 12/02/23 1449  AST 23 18  ALT 14 12  ALKPHOS 68 49  BILITOT 0.6 0.3  PROT 7.4 6.6  ALBUMIN 3.7 3.0*   Recent Labs  Lab 11/30/23 1722  LIPASE 40  No results for input(s): "AMMONIA" in the last 168 hours. Coagulation Profile: No results for input(s): "INR", "PROTIME" in the last 168 hours. Cardiac Enzymes: No results for input(s): "CKTOTAL", "CKMB", "CKMBINDEX", "TROPONINI" in the last 168 hours. BNP (last 3 results) No results for input(s): "PROBNP" in the last 8760 hours. HbA1C: No results for input(s): "HGBA1C" in the last 72 hours. CBG: No results for input(s): "GLUCAP" in the last 168 hours. Lipid Profile: No results for input(s): "CHOL", "HDL", "LDLCALC", "TRIG", "CHOLHDL", "LDLDIRECT" in the last 72 hours. Thyroid Function Tests: No results for input(s): "TSH", "T4TOTAL", "FREET4", "T3FREE", "THYROIDAB" in the last 72 hours. Anemia Panel: No results for input(s): "VITAMINB12", "FOLATE", "FERRITIN", "TIBC", "IRON", "RETICCTPCT" in the last 72 hours. Sepsis Labs: No results for input(s): "PROCALCITON",  "LATICACIDVEN" in the last 168 hours.  Recent Results (from the past 240 hours)  Resp panel by RT-PCR (RSV, Flu A&B, Covid) Anterior Nasal Swab     Status: None   Collection Time: 12/02/23  2:38 PM   Specimen: Anterior Nasal Swab  Result Value Ref Range Status   SARS Coronavirus 2 by RT PCR NEGATIVE NEGATIVE Final   Influenza A by PCR NEGATIVE NEGATIVE Final   Influenza B by PCR NEGATIVE NEGATIVE Final    Comment: (NOTE) The Xpert Xpress SARS-CoV-2/FLU/RSV plus assay is intended as an aid in the diagnosis of influenza from Nasopharyngeal swab specimens and should not be used as a sole basis for treatment. Nasal washings and aspirates are unacceptable for Xpert Xpress SARS-CoV-2/FLU/RSV testing.  Fact Sheet for Patients: BloggerCourse.com  Fact Sheet for Healthcare Providers: SeriousBroker.it  This test is not yet approved or cleared by the [place] FDA and has been authorized for detection and/or diagnosis of SARS-CoV-2 by FDA under an Emergency Use Authorization (EUA). This EUA will remain in effect (meaning this test can be used) for the duration of the COVID-19 declaration under Section 564(b)(1) of the Act, 21 U.S.C. section 360bbb-3(b)(1), unless the authorization is terminated or revoked.     Resp Syncytial Virus by PCR NEGATIVE NEGATIVE Final    Comment: (NOTE) Fact Sheet for Patients: BloggerCourse.com  Fact Sheet for Healthcare Providers: SeriousBroker.it  This test is not yet approved or cleared by the [place] FDA and has been authorized for detection and/or diagnosis of SARS-CoV-2 by FDA under an Emergency Use Authorization (EUA). This EUA will remain in effect (meaning this test can be used) for the duration of the COVID-19 declaration under Section 564(b)(1) of the Act, 21 U.S.C. section 360bbb-3(b)(1), unless the authorization is terminated  or revoked.  Performed at [facility], [address], [place], [place] [address]     Radiology Studies: DG Chest Port 1 View Result Date: 12/02/2023 CLINICAL DATA:  Shortness of breath. EXAM: PORTABLE CHEST 1 VIEW COMPARISON:  11/30/2023. FINDINGS: Stable cardiomediastinal silhouette. Emphysematous changes again seen. Similar bibasilar atelectatic changes. No focal consolidation, sizeable effusion, or pneumothorax. Lower cervical fixation hardware. Bilateral breast prosthesis. No acute osseous abnormality. IMPRESSION: Emphysema.  Similar bibasilar atelectatic changes. Electronically Signed   By: Hart Robinsons M.D.   On: 12/02/2023 16:11   Scheduled Meds:  apixaban  5 mg Oral BID   atorvastatin  40 mg Oral Daily   citalopram  20 mg Oral Daily   midodrine  10 mg Oral TID WC   umeclidinium bromide  1 puff Inhalation Daily   Continuous Infusions:   LOS: 1 day   Marguerita Merles, DO [facility] Available via Epic secure chat 7am-7pm  After these hours, please refer to coverage provider listed on [email or web address] 12/03/2023, 5:44 PM

## 2023-12-03 NOTE — Progress Notes (Deleted)
 Cardiology Clinic Note   Patient Name: [name] Date of Encounter: 12/03/2023  Primary Care Provider:  Melida Quitter, PA Primary Cardiologist:  Rollene Rotunda, MD  Patient Profile    [name] 73 year old female presents to the clinic today for follow-up evaluation of her nonischemic cardiomyopathy and severe mitral valve regurgitation.  Past Medical History    Past Medical History:  Diagnosis Date   Anemia    B12 deficient   Arthritis    B12 deficiency    Breast cancer (HCC)    left breast.   Chronic SI joint pain    Bilateral, Dr. Corliss Skains   COPD (chronic obstructive pulmonary disease) (HCC)    Fibromyalgia    Dr. Corliss Skains   GERD (gastroesophageal reflux disease)    Greater trochanteric bursitis of both hips    Dr. Corliss Skains   Hyperlipidemia    IBS (irritable bowel syndrome)    LV dysfunction    iatrogenic from chemotherapy ; on Carvedilol    Migraines    Osteoporosis    Dr Lomax---> transferring to a new gyn   Oxygen dependent    3 liters   Past Surgical History:  Procedure Laterality Date   ABDOMINAL HYSTERECTOMY     BSO for  Endometriosis   CARDIOVERSION N/A 11/16/2023   Procedure: CARDIOVERSION;  Surgeon: Thomasene Ripple, DO;  Location: [facility] IN[facility];  Service: Cardiovascular;  Laterality: N/A;   CARPAL TUNNEL RELEASE     left   CERVICAL DISC SURGERY     COLONOSCOPY W/ POLYPECTOMY  1999   Dr Kinnie Scales   ESOPHAGEAL DILATION  2005   Dr Kinnie Scales   MASTECTOMY  2009   bilateral, Dr Jamey Ripa   ORIF ANKLE FRACTURE Right 10/12/2023   Procedure: RIGHT OPEN REDUCTION INTERNAL FIXATION (ORIF) ANKLE FRACTURE;  Surgeon: Huel Cote, MD;  Location: [facility] OR;  Service: Orthopedics;  Laterality: Right;   SEPTOPLASTY     TRANSESOPHAGEAL ECHOCARDIOGRAM (CATH LAB) N/A 11/16/2023   Procedure: TRANSESOPHAGEAL ECHOCARDIOGRAM;  Surgeon: Thomasene Ripple, DO;  Location: [facility] INVAS[facility] CV [facility];  Service: Cardiovascular;  Laterality: N/A;     Allergies  Allergies  Allergen Reactions   Amoxicillin-Pot Clavulanate     diarrhea   Doxycycline     Nausea & vomiting   Venlafaxine     ? Reaction; ? Blurred vision    History of Present Illness    [name] has a PMH of nonischemic cardiomyopathy post breast cancer treatment, COPD on home O2, extreme frailty with BMI of 16, HFpEF, chronic anemia, GERD, paroxysmal atrial fibrillation on apixaban, and severe MR.  She is a resident of [facility].  She presents to the clinic today for follow-up evaluation and states***.  *** denies chest pain, shortness of breath, lower extremity edema, fatigue, palpitations, melena, hematuria, hemoptysis, diaphoresis, weakness, presyncope, syncope, orthopnea, and PND.  Atrial fibrillation with RVR-EKG today shows***.  Severe mitral valve regurgitation-seen and evaluated by structural heart team  HFpEF-NYHA class***.  Weight today***.  Echocardiogram showed severe MR.  Also with PAF.  Coronary artery disease-noted on CT scan.  Denies exertional chest pain and anginal type symptoms.  COPD-no increased work of breathing today.  Maintains on 3 L of O2. Follows with pulmonology  Disposition:***.  Home Medications    Prior to Admission medications   Medication Sig Start Date End Date Taking? Authorizing Provider  acetaminophen (TYLENOL) 500 MG tablet Take 1,000 mg by mouth every 6 (six) hours as needed for  moderate pain (pain score 4-6) or mild pain (pain score 1-3).    [provider]  albuterol (VENTOLIN HFA) 108 (90 Base) MCG/ACT inhaler Inhale 2 puffs every 6 hours as needed 02/19/23   Jetty Duhamel D, MD  amiodarone (PACERONE) 200 MG tablet Take 1 tablet (200 mg total) by mouth daily. 11/24/23   Robbie Lis M, PA-C  apixaban (ELIQUIS) 5 MG TABS tablet Take 1 tablet (5 mg total) by mouth 2 (two) times daily. 11/20/23   Lanae Boast, MD  atorvastatin (LIPITOR) 40 MG tablet Take 1 tablet (40 mg  total) by mouth daily. 11/21/23   Lanae Boast, MD  Calcium Carbonate-Vit D-Min (CALCIUM 600+D PLUS MINERALS) 600-400 MG-UNIT TABS Take 1 tablet by mouth in the morning.    [provider]  citalopram (CELEXA) 20 MG tablet Take 1 tablet (20 mg total) by mouth in the morning. 11/21/23   Lanae Boast, MD  diltiazem (CARDIZEM CD) 120 MG 24 hr capsule Take 1 capsule (120 mg total) by mouth daily. 02/12/23   Dunn, Tacey Ruiz, PA-C  docusate sodium (COLACE) 100 MG capsule Take 100 mg by mouth daily.    [provider]  furosemide (LASIX) 40 MG tablet Take 1 tablet (40 mg total) by mouth daily. 11/21/23   Lanae Boast, MD  hydrOXYzine (ATARAX/VISTARIL) 10 MG tablet Take 10 mg by mouth every 6 (six) hours as needed for anxiety. 05/31/20   [provider]  magnesium oxide (MAG-OX) 400 (240 Mg) MG tablet Take 400 mg by mouth daily.    [provider]  methocarbamol (ROBAXIN) 750 MG tablet TAKE 1 TABLET BY MOUTH 2 TIMES DAILY AS NEEDED FOR MUSCLE SPASMS (NON FORMULARY) 10/04/23   Pollyann Savoy, MD  Multiple Vitamins-Minerals (MULTIVITAMIN WITH MINERALS) tablet Take 1 tablet by mouth daily.    [provider]  ondansetron (ZOFRAN) 4 MG tablet Take 4 mg by mouth every 8 (eight) hours as needed. 11/22/23   [provider]  ondansetron (ZOFRAN-ODT) 4 MG disintegrating tablet Take 4 mg by mouth every 8 (eight) hours as needed. 11/09/23   [provider]  oxyCODONE (ROXICODONE) 5 MG immediate release tablet Take 1 tablet (5 mg total) by mouth every 4 (four) hours as needed for up to 5 doses for severe pain (pain score 7-10) or breakthrough pain. 11/20/23   Lanae Boast, MD  OXYGEN Inhale 3 L into the lungs continuous.    [provider]  pantoprazole (PROTONIX) 20 MG tablet Take 20 mg by mouth daily. 11/09/23   [provider]  potassium chloride SA (KLOR-CON M) 20 MEQ tablet Take 20 mEq by mouth daily. 12/01/23   [provider]  predniSONE  (DELTASONE) 20 MG tablet Take 20 mg by mouth daily. 12/01/23   [provider]  umeclidinium bromide (INCRUSE ELLIPTA) 62.5 MCG/ACT AEPB Inhale 1 puff into the lungs daily. 02/19/23   Waymon Budge, MD    Family History    Family History  Problem Relation Age of Onset   Diabetes Mother    Breast cancer Mother    Depression Mother        anxiety   Heart disease Mother        in her 74s   Colon cancer Sister    Healthy Daughter    Stroke Neg Hx    She indicated that her mother is deceased. She indicated that her father is deceased. She indicated that only one of her two sisters is alive. She indicated that  her brother is deceased. She indicated that her daughter is alive. She indicated that the status of her neg hx is unknown.  Social History    Social History   Socioeconomic History   Marital status: Widowed    Spouse name: Not on file   Number of children: 1   Years of education: Not on file   Highest education level: 12th grade  Occupational History   Occupation: retired, Warehouse manager   Tobacco Use   Smoking status: Former    Current packs/day: 0.00    Average packs/day: 1 pack/day for 30.0 years (30.0 ttl pk-yrs)    Types: Cigarettes    Start date: 08/31/1960    Quit date: 08/31/1990    Years since quitting: 33.2    Passive exposure: Never   Smokeless tobacco: Never  Vaping Use   Vaping status: Never Used  Substance and Sexual Activity   Alcohol use: No   Drug use: Not Currently    Types: Marijuana    Comment: 08/01/2017 last used    Sexual activity: Not Currently  Other Topics Concern   Not on file  Social History Narrative   Lives w/ husband   Social Drivers of Health   Financial Resource Strain: Low Risk  (11/17/2023)   Overall Financial Resource Strain (CARDIA)    Difficulty of Paying Living Expenses: Not hard at all  Food Insecurity: Food Insecurity Present (12/03/2023)   Hunger Vital Sign    Worried About Running Out of Food in the Last Year:  Sometimes true    Ran Out of Food in the Last Year: Sometimes true  Transportation Needs: No Transportation Needs (12/03/2023)   PRAPARE - Administrator, Civil Service (Medical): No    Lack of Transportation (Non-Medical): No  Physical Activity: Inactive (05/11/2023)   Exercise Vital Sign    Days of Exercise per Week: 0 days    Minutes of Exercise per Session: 0 min  Stress: No Stress Concern Present (05/11/2023)   [employer] of Occupational Health - Occupational Stress Questionnaire    Feeling of Stress : Not at all  Social Connections: Socially Isolated (12/03/2023)   Social Connection and Isolation Panel [NHANES]    Frequency of Communication with Friends and Family: More than three times a week    Frequency of Social Gatherings with Friends and Family: More than three times a week    Attends Religious Services: Never    Database administrator or Organizations: No    Attends Banker Meetings: Never    Marital Status: Widowed  Intimate Partner Violence: Not At Risk (12/03/2023)   Humiliation, Afraid, Rape, and Kick questionnaire    Fear of Current or Ex-Partner: No    Emotionally Abused: No    Physically Abused: No    Sexually Abused: No     Review of Systems    General:  No chills, fever, night sweats or weight changes.  Cardiovascular:  No chest pain, dyspnea on exertion, edema, orthopnea, palpitations, paroxysmal nocturnal dyspnea. Dermatological: No rash, lesions/masses Respiratory: No cough, dyspnea Urologic: No hematuria, dysuria Abdominal:   No nausea, vomiting, diarrhea, bright red blood per rectum, melena, or hematemesis Neurologic:  No visual changes, wkns, changes in mental status. All other systems reviewed and are otherwise negative except as noted above.  Physical Exam    VS:  There were no vitals taken for this visit. , BMI There is no height or weight on file to calculate BMI. GEN: Well nourished,  well developed, in no acute  distress. HEENT: normal. Neck: Supple, no JVD, carotid bruits, or masses. Cardiac: RRR, no murmurs, rubs, or gallops. No clubbing, cyanosis, edema.  Radials/DP/PT 2+ and equal bilaterally.  Respiratory:  Respirations regular and unlabored, clear to auscultation bilaterally. GI: Soft, nontender, nondistended, BS + x 4. MS: no deformity or atrophy. Skin: warm and dry, no rash. Neuro:  Strength and sensation are intact. Psych: Normal affect.  Accessory Clinical Findings    Recent Labs: 11/14/2023: TSH 2.991 11/30/2023: Magnesium 1.8 12/02/2023: ALT 12; B Natriuretic Peptide 746.7; BUN 23; Creatinine, Ser 0.75; Hemoglobin 9.0; Platelets 357; Potassium 3.9; Sodium 138   Recent Lipid Panel    Component Value Date/Time   CHOL 202 (H) 11/16/2023 0631   CHOL 267 (H) 08/30/2023 0915   TRIG 94 11/16/2023 0631   TRIG 78 08/12/2006 0922   HDL 52 11/16/2023 0631   HDL 96 08/30/2023 0915   CHOLHDL 3.9 11/16/2023 0631   VLDL 19 11/16/2023 0631   LDLCALC 131 (H) 11/16/2023 0631   LDLCALC 160 (H) 08/30/2023 0915   LDLDIRECT 143.0 10/18/2007 0000         ECG personally reviewed by me today- ***     Echocardiogram 12/06/2023***       Assessment & Plan   1.  ***   Thomasene Ripple. Jandel Patriarca NP-C     12/03/2023, 7:10 AM [place] [facility] [address] Office [phone] Fax [phone]    I spent***minutes examining this patient, reviewing medications, and using patient centered shared decision making involving their cardiac care.   I spent  20 minutes reviewing past medical history,  medications, and prior cardiac tests.

## 2023-12-03 NOTE — Evaluation (Addendum)
 Clinical/Bedside Swallow Evaluation Patient Details  Name: [name] MRN: [id number] Date of Birth: 12/05/1950  Today's Date: 12/03/2023 Time: SLP Start Time (ACUTE ONLY): 1302 SLP Stop Time (ACUTE ONLY): 1318 SLP Time Calculation (min) (ACUTE ONLY): 16 min  Past Medical History:  Past Medical History:  Diagnosis Date   Anemia    B12 deficient   Arthritis    B12 deficiency    Breast cancer (HCC)    left breast.   Chronic SI joint pain    Bilateral, Dr. Corliss Skains   COPD (chronic obstructive pulmonary disease) (HCC)    Fibromyalgia    Dr. Corliss Skains   GERD (gastroesophageal reflux disease)    Greater trochanteric bursitis of both hips    Dr. Corliss Skains   Hyperlipidemia    IBS (irritable bowel syndrome)    LV dysfunction    iatrogenic from chemotherapy ; on Carvedilol    Migraines    Osteoporosis    Dr Lomax---> transferring to a new gyn   Oxygen dependent    3 liters   Past Surgical History:  Past Surgical History:  Procedure Laterality Date   ABDOMINAL HYSTERECTOMY     BSO for  Endometriosis   CARDIOVERSION N/A 11/16/2023   Procedure: CARDIOVERSION;  Surgeon: Thomasene Ripple, DO;  Location: [facility];  Service: Cardiovascular;  Laterality: N/A;   CARPAL TUNNEL RELEASE     left   CERVICAL DISC SURGERY     COLONOSCOPY W/ POLYPECTOMY  1999   Dr Kinnie Scales   ESOPHAGEAL DILATION  2005   Dr Kinnie Scales   MASTECTOMY  2009   bilateral, Dr Jamey Ripa   ORIF ANKLE FRACTURE Right 10/12/2023   Procedure: RIGHT OPEN REDUCTION INTERNAL FIXATION (ORIF) ANKLE FRACTURE;  Surgeon: Huel Cote, MD;  Location: [facility] OR;  Service: Orthopedics;  Laterality: Right;   SEPTOPLASTY     TRANSESOPHAGEAL ECHOCARDIOGRAM (CATH LAB) N/A 11/16/2023   Procedure: TRANSESOPHAGEAL ECHOCARDIOGRAM;  Surgeon: Thomasene Ripple, DO;  Location: [facility];  Service: Cardiovascular;  Laterality: N/A;   HPI:  73 y.o. female presents to [facility] hospital on 12/02/2023 with worsening SOB, weakness and intermittent  palpitations. Per chart pt admitted for management of heart failure exacerbation. PMH includes COPD, NICM, HLD, GERD, fibromyalgia, recent bimalleolar ankle fx (feb 2025), PAF, severe MR, HFrEF. ST eval recommended by RD to liberalize pt's diet and RD note states pt endorses difficulty swallowing and feels like food and medications get stuck in her throat. CXR Emphysema. Similar bibasilar atelectatic changes. Pt states she was admitted 2 weeks ago and had pna.    Assessment / Plan / Recommendation  Clinical Impression  Pt demonstrates characteristics of a suspected esophageal dysphagia. She reports an intermittent globus sensation with food and pills and points to area near cervical esophagus rather than pharynx. She states she sometimes but not often coughs when eating/drinking and although GERD is listed in PMH she denies having this or symptoms. If pills are large and round she prefers to take with applesauce which facilitates transit. She also drinks liquid after solids and states this typically resolves globus sensation. There were no indications of decreased airway protection across consistencies and she denied globus sensation during assessment. Pt stated she "is not breathing as well today" and respirations appeared stable throughout evaluation. SLP educated re: relationship between swallowing and COPD and to take breaks or defer eating if feeling short of breath. Mastication with regular texture was adequate and SLP upgraded to regular (currently on Dys 3), continue thin, larger  pills whole with applesauce. Brought her a menu and encouraged her to call and order what she prefers and ordered room service with assist when regular texture ordered. No further ST indicated. SLP Visit Diagnosis: Dysphagia, unspecified (R13.10)    Aspiration Risk  Mild aspiration risk    Diet Recommendation Regular;Thin liquid    Liquid Administration via: Straw;Cup Medication Administration: Other (Comment) (pt  prefers whole in applesauce if pill is large and round) Supervision: Patient able to self feed Compensations: Other (Comment) (rest breaks if needed) Postural Changes: Seated upright at 90 degrees    Other  Recommendations Oral Care Recommendations: Oral care BID    Recommendations for follow up therapy are one component of a multi-disciplinary discharge planning process, led by the attending physician.  Recommendations may be updated based on patient status, additional functional criteria and insurance authorization.  Follow up Recommendations No SLP follow up      Assistance Recommended at Discharge    Functional Status Assessment Patient has not had a recent decline in their functional status  Frequency and Duration            Prognosis        Swallow Study   General Date of Onset: 12/02/23 HPI: 72 y.o. female presents to [facility] hospital on 12/02/2023 with worsening SOB, weakness and intermittent palpitations. Per chart pt admitted for management of heart failure exacerbation. PMH includes COPD, NICM, HLD, GERD, fibromyalgia, recent bimalleolar ankle fx (feb 2025), PAF, severe MR, HFrEF. ST eval recommended by RD to liberalize pt's diet and RD note states pt endorses difficulty swallowing and feels like food and medications get stuck in her throat. CXR Emphysema. Similar bibasilar atelectatic changes. Pt states she was admitted 2 weeks ago and had pna. Type of Study: Bedside Swallow Evaluation Previous Swallow Assessment:  (none) Diet Prior to this Study: Dysphagia 3 (mechanical soft);Thin liquids (Level 0) Temperature Spikes Noted: No Respiratory Status: Nasal cannula History of Recent Intubation: No Behavior/Cognition: Alert;Cooperative;Pleasant mood Oral Cavity Assessment: Within Functional Limits Oral Care Completed by SLP: No Oral Cavity - Dentition: Adequate natural dentition (missing on maxillary tooth beside front tooth) Vision: Functional for self-feeding Self-Feeding  Abilities: Able to feed self Patient Positioning: Other (comment) (on side of bed) Baseline Vocal Quality: Normal Volitional Cough: Weak Volitional Swallow: Able to elicit    Oral/Motor/Sensory Function Overall Oral Motor/Sensory Function: Within functional limits   Ice Chips Ice chips: Not tested   Thin Liquid Thin Liquid: Within functional limits Presentation: Cup;Straw    Nectar Thick Nectar Thick Liquid: Not tested   Honey Thick Honey Thick Liquid: Not tested   Puree Puree: Within functional limits   Solid     Solid: Within functional limits      Royce Macadamia 12/03/2023,1:56 PM

## 2023-12-03 NOTE — Evaluation (Signed)
 Physical Therapy Evaluation Patient Details Name: [name] MRN: [id number] DOB: 09/25/1950 Today's Date: 12/03/2023  History of Present Illness  73 y.o. female presents to [facility] hospital on 12/02/2023 with worsening SOB, weakness and intermittent palpitations. Pt admitted for management of heart failure exacerbation. PMH includes COPD, NICM, HLD, GERD, recent bimalleolar ankle fx (feb 2025), PAF, severe MR, HFrEF.  Clinical Impression  Pt presents to PT with deficits in functional mobility, gait, power, balance, endurance, and cardiopulmonary function. Pt was hypotensive prior to PT arrival but BP improved with activity, up to 105/55 after ambulation compared to 74/48 in supine pre-mobility. Pt is greatly limited by DOE and poor endurance, fatiguing very quickly when ambulating. Pt appears unable to tolerate enough activity to mobilize within the home and perform daily ADL/IADL tasks. PT recommends a return to short term inpatient rehab in an effort to continue progressing upon the patients endurance deficits.       If plan is discharge home, recommend the following: A little help with walking and/or transfers;A lot of help with bathing/dressing/bathroom;Assistance with cooking/housework;Assist for transportation;Help with stairs or ramp for entrance   Can travel by private vehicle   Yes    Equipment Recommendations None recommended by PT  Recommendations for Other Services       Functional Status Assessment Patient has had a recent decline in their functional status and demonstrates the ability to make significant improvements in function in a reasonable and predictable amount of time.     Precautions / Restrictions Precautions Precautions: Fall Recall of Precautions/Restrictions: Intact Restrictions Weight Bearing Restrictions Per Provider Order: No      Mobility  Bed Mobility Overal bed mobility: Needs Assistance Bed Mobility: Supine to Sit, Sit to Supine     Supine to  sit: Supervision, HOB elevated, Used rails Sit to supine: Contact guard assist        Transfers Overall transfer level: Needs assistance Equipment used: Rolling walker (2 wheels) Transfers: Sit to/from Stand Sit to Stand: Contact guard assist                Ambulation/Gait Ambulation/Gait assistance: Contact guard assist Gait Distance (Feet): 20 Feet Assistive device: Rolling walker (2 wheels) Gait Pattern/deviations: Step-to pattern Gait velocity: reduced Gait velocity interpretation: <1.31 ft/sec, indicative of household ambulator   General Gait Details: slowed step-to gait, one brief standing rest break due to fatigue  Stairs            Wheelchair Mobility     Tilt Bed    Modified Rankin (Stroke Patients Only)       Balance Overall balance assessment: Needs assistance Sitting-balance support: No upper extremity supported, Feet supported Sitting balance-Leahy Scale: Fair     Standing balance support: Bilateral upper extremity supported, Reliant on assistive device for balance Standing balance-Leahy Scale: Poor                               Pertinent Vitals/Pain Pain Assessment Pain Assessment: 0-10 Pain Score: 4  Pain Location: R ankle Pain Descriptors / Indicators: Aching Pain Intervention(s): Limited activity within patient's tolerance    Home Living Family/patient expects to be discharged to:: Unsure Living Arrangements: Alone (was at [facility] for short term rehab prior to this admission, lives alone at baseline) Available Help at Discharge:  (pt reports no consistent caregiver support) Type of Home: House Home Access: Stairs to enter Entrance Stairs-Rails: Doctor, general practice of Steps: 4  Home Layout: One level Home Equipment: Grab bars - tub/shower;Cane - quad;Rollator (4 wheels);Shower seat;BSC/3in1      Prior Function Prior Level of Function : Needs assist             Mobility Comments: modI  with rollator at baseline, multiple falls in the past. Reports she has been having difficulty with poor endurance and only ambulates for short household distances       Extremity/Trunk Assessment   Upper Extremity Assessment Upper Extremity Assessment: Generalized weakness    Lower Extremity Assessment Lower Extremity Assessment: Generalized weakness    Cervical / Trunk Assessment Cervical / Trunk Assessment: Normal  Communication   Communication Communication: No apparent difficulties    Cognition Arousal: Alert Behavior During Therapy: WFL for tasks assessed/performed   PT - Cognitive impairments: No apparent impairments                         Following commands: Intact       Cueing Cueing Techniques: Verbal cues, Gestural cues, Tactile cues     General Comments General comments (skin integrity, edema, etc.): VSS on 3L O2, pt reports DOE when mobilizing and fatigues quickly despite O2 sats in mid 90s    Exercises     Assessment/Plan    PT Assessment Patient needs continued PT services  PT Problem List Decreased activity tolerance;Decreased balance;Decreased mobility;Cardiopulmonary status limiting activity;Pain       PT Treatment Interventions DME instruction;Gait training;Stair training;Functional mobility training;Therapeutic activities;Therapeutic exercise;Balance training;Neuromuscular re-education;Patient/family education    PT Goals (Current goals can be found in the Care Plan section)  Acute Rehab PT Goals Patient Stated Goal: to improve activity tolerance PT Goal Formulation: With patient Time For Goal Achievement: 12/17/23 Potential to Achieve Goals: Fair Additional Goals Additional Goal #1: Pt will report 2/4 DOE or less when ambulating for >75' to demonstrate improved tolerance for household ambulation    Frequency Min 2X/week     Co-evaluation               AM-PAC PT "6 Clicks" Mobility  Outcome Measure Help needed  turning from your back to your side while in a flat bed without using bedrails?: A Little Help needed moving from lying on your back to sitting on the side of a flat bed without using bedrails?: A Little Help needed moving to and from a bed to a chair (including a wheelchair)?: A Little Help needed standing up from a chair using your arms (e.g., wheelchair or bedside chair)?: A Little Help needed to walk in hospital room?: A Little Help needed climbing 3-5 steps with a railing? : A Lot 6 Click Score: 17    End of Session Equipment Utilized During Treatment: Gait belt;Oxygen Activity Tolerance: Patient limited by fatigue Patient left: in bed;with call bell/phone within reach;with bed alarm set;with family/visitor present Nurse Communication: Mobility status PT Visit Diagnosis: History of falling (Z91.81);Other abnormalities of gait and mobility (R26.89);Difficulty in walking, not elsewhere classified (R26.2);Pain Pain - Right/Left: Right Pain - part of body: Ankle and joints of foot    Time: 1009-1031 PT Time Calculation (min) (ACUTE ONLY): 22 min   Charges:   PT Evaluation $PT Eval Low Complexity: 1 Low   PT General Charges $$ ACUTE PT VISIT: 1 Visit         Arlyss Gandy, PT, DPT Acute Rehabilitation Office [phone]   Arlyss Gandy 12/03/2023, 12:29 PM

## 2023-12-03 NOTE — Progress Notes (Signed)
 Albumin given for hypotension despite midodrine.

## 2023-12-03 NOTE — Progress Notes (Signed)
   12/03/23 0905  Assess: MEWS Score  BP (!) 75/50  MAP (mmHg) (!) 58  Pulse Rate 72  ECG Heart Rate 73  Resp 14  Level of Consciousness Alert  SpO2 95 %  O2 Device Nasal Cannula  O2 Flow Rate (L/min) 3 L/min  Assess: MEWS Score  MEWS Temp 0  MEWS Systolic 2  MEWS Pulse 0  MEWS RR 0  MEWS LOC 0  MEWS Score 2  MEWS Score Color Yellow  Assess: if the MEWS score is Yellow or Red  Were vital signs accurate and taken at a resting state? Yes  Does the patient meet 2 or more of the SIRS criteria? No  MEWS guidelines implemented  No, previously yellow, continue vital signs every 4 hours  Notify: Charge Nurse/RN  Name of Charge Nurse/RN Notified Bryan  Assess: SIRS CRITERIA  SIRS Temperature  0  SIRS Respirations  0  SIRS Pulse 0  SIRS WBC 0  SIRS Score Sum  0

## 2023-12-03 NOTE — Evaluation (Signed)
 Occupational Therapy Evaluation Patient Details Name: [name] MRN: [id number] DOB: 1951/03/27 Today's Date: 12/03/2023   History of Present Illness   73 y.o. female presents to [facility] hospital on 12/02/2023 with worsening SOB, weakness and intermittent palpitations. Pt admitted for management of heart failure exacerbation. PMH includes COPD, NICM, HLD, GERD, recent bimalleolar ankle fx (feb 2025), PAF, severe MR, HFrEF.     Clinical Impressions PTA, pt lives alone and reports prior to ankle fx in February, pt was completely independent. Pt admitted from SNF rehab with diagnoses noted above and deficits in cardiopulmonary endurance, strength and dynamic standing balance. Pt able to manage ADLs and mobility using RW with Supervision though did endorse SOB after these activities. Provided energy conservation handout and initial education on strategies to implement at home. Pt agreeable for DC back to [facility] to complete inpatient rehab stay prior to DC home.   SpO2 97% on baseline 3 L O2 BP pre activity: 99/58 BP post activity: 119/62     If plan is discharge home, recommend the following:   Assistance with cooking/housework;Assist for transportation;Help with stairs or ramp for entrance;A little help with bathing/dressing/bathroom     Functional Status Assessment   Patient has had a recent decline in their functional status and demonstrates the ability to make significant improvements in function in a reasonable and predictable amount of time.     Equipment Recommendations   None recommended by OT     Recommendations for Other Services         Precautions/Restrictions   Precautions Precautions: Fall Recall of Precautions/Restrictions: Intact Precaution/Restrictions Comments: monitor O2 (3 L O2 at baseline) and BP Restrictions Weight Bearing Restrictions Per Provider Order: No     Mobility Bed Mobility Overal bed mobility: Needs Assistance Bed Mobility: Supine  to Sit, Sit to Supine     Supine to sit: Supervision, HOB elevated, Used rails Sit to supine: Supervision        Transfers Overall transfer level: Needs assistance Equipment used: Rolling walker (2 wheels) Transfers: Sit to/from Stand Sit to Stand: Supervision                  Balance Overall balance assessment: Needs assistance Sitting-balance support: No upper extremity supported, Feet supported Sitting balance-Leahy Scale: Fair     Standing balance support: Bilateral upper extremity supported, Reliant on assistive device for balance Standing balance-Leahy Scale: Poor                             ADL either performed or assessed with clinical judgement   ADL Overall ADL's : Needs assistance/impaired Eating/Feeding: Independent   Grooming: Supervision/safety;Standing   Upper Body Bathing: Set up;Sitting   Lower Body Bathing: Supervison/ safety;Sitting/lateral leans;Sit to/from stand   Upper Body Dressing : Set up;Sitting   Lower Body Dressing: Supervision/safety;Sitting/lateral leans;Sit to/from stand Lower Body Dressing Details (indicate cue type and reason): able to bring feet to self easily to don socks Toilet Transfer: Supervision/safety;Ambulation;Rolling walker (2 wheels);Regular Teacher, adult education Details (indicate cue type and reason): to/from bathroom without LOB but did endorse SOB Toileting- Clothing Manipulation and Hygiene: Supervision/safety;Sitting/lateral lean;Sit to/from stand       Functional mobility during ADLs: Supervision/safety;Rolling walker (2 wheels)       Vision Baseline Vision/History: 1 Wears glasses Ability to See in Adequate Light: 0 Adequate Patient Visual Report: No change from baseline Vision Assessment?: No apparent visual deficits     Perception  Praxis         Pertinent Vitals/Pain Pain Assessment Pain Assessment: No/denies pain     Extremity/Trunk Assessment Upper Extremity  Assessment Upper Extremity Assessment: Overall WFL for tasks assessed;Right hand dominant   Lower Extremity Assessment Lower Extremity Assessment: Defer to PT evaluation   Cervical / Trunk Assessment Cervical / Trunk Assessment: Normal   Communication Communication Communication: No apparent difficulties   Cognition Arousal: Alert Behavior During Therapy: Flat affect Cognition: No apparent impairments                               Following commands: Intact       Cueing  General Comments   Cueing Techniques: Verbal cues;Gestural cues;Tactile cues  VSS on 3L O2, pt reports DOE when mobilizing and fatigues quickly despite O2 sats in mid 90s   Exercises     Shoulder Instructions      Home Living Family/patient expects to be discharged to:: Private residence Living Arrangements: Alone Available Help at Discharge: Family;Available PRN/intermittently Type of Home: House Home Access: Stairs to enter [employer] of Steps: 4 Entrance Stairs-Rails: Right;Left Home Layout: One level     Bathroom Shower/Tub: Producer, television/film/video: Handicapped height Bathroom Accessibility: Yes   Home Equipment: Grab bars - tub/shower;Cane - quad;Rollator (4 wheels);Shower seat;BSC/3in1   Additional Comments: recently admitted to [facility] rehab 1 week ago. Reports only being home for 2 days before this recent admission to rehab (was there in Feb)      Prior Functioning/Environment Prior Level of Function : Needs assist             Mobility Comments: modI with rollator at baseline, multiple falls in the past. Reports she has been having difficulty with poor endurance and only ambulates for short household distances ADLs Comments: Independent with ADLs/IADLs. Pt reports sleeping in a recliner chair at home d/t orthopnea. Pt drives and manages her own medications.    OT Problem List: Decreased activity tolerance;Impaired balance (sitting and/or  standing);Cardiopulmonary status limiting activity   OT Treatment/Interventions: Self-care/ADL training;DME and/or AE instruction;Balance training;Patient/family education;Energy conservation      OT Goals(Current goals can be found in the care plan section)   Acute Rehab OT Goals Patient Stated Goal: be able to finish rehab and stay home OT Goal Formulation: With patient Time For Goal Achievement: 12/17/23 Potential to Achieve Goals: Good   OT Frequency:  Min 2X/week    Co-evaluation              AM-PAC OT "6 Clicks" Daily Activity     Outcome Measure Help from another person eating meals?: None Help from another person taking care of personal grooming?: A Little Help from another person toileting, which includes using toliet, bedpan, or urinal?: A Little Help from another person bathing (including washing, rinsing, drying)?: A Little Help from another person to put on and taking off regular upper body clothing?: A Little Help from another person to put on and taking off regular lower body clothing?: A Little 6 Click Score: 19   End of Session Equipment Utilized During Treatment: Rolling walker (2 wheels);Oxygen Nurse Communication: Other (comment) (asking about inhaler)  Activity Tolerance: Patient tolerated treatment well Patient left: in bed;with call bell/phone within reach  OT Visit Diagnosis: Other abnormalities of gait and mobility (R26.89);Muscle weakness (generalized) (M62.81)                Time:  8119-1478 OT Time Calculation (min): 22 min Charges:  OT General Charges $OT Visit: 1 Visit OT Evaluation $OT Eval Moderate Complexity: 1 Mod  Bradd Canary, OTR/L Acute Rehab Services Office: [phone]   Lorre Munroe 12/03/2023, 1:40 PM

## 2023-12-03 NOTE — Telephone Encounter (Signed)
 Patient identification verified by 2 forms. Marilynn Rail, RN    Called and spoke to patients daughter Lonia Chimera states:   -patient is currently admitted at [facility]   -patient continues to have low BP, low O2 and fluid   -patient was supposed to have consult today with Dr. Excell Seltzer for mTEER  -unsure if surgery could stop the frequent hospitalization   -would like to know how procedure can be done while patient is hospitalized  Advised Heather to review plan/request for mTEER surgery with patients hospital team  Heather verbalized understanding, no questions at this time

## 2023-12-03 NOTE — Progress Notes (Addendum)
 Progress Note  Patient Name: [name] Date of Encounter: 12/03/2023 Primary Cardiologist: Rollene Rotunda, MD   Subjective   Overnight No CP, SOB, Palpitations.  Vital Signs    Vitals:   12/03/23 0322 12/03/23 0620 12/03/23 0748 12/03/23 0905  BP: (!) 87/53 (!) 77/48 (!) 80/55 (!) 75/50  Pulse: 61 (!) 58 73 72  Resp: 17 20 20 14   Temp: 97.8 F (36.6 C)     TempSrc: Oral  Oral   SpO2: 99% 100% 96% 95%  Weight: 40 kg     Height:        Intake/Output Summary (Last 24 hours) at 12/03/2023 0933 Last data filed at 12/03/2023 0755 Gross per 24 hour  Intake 500 ml  Output 200 ml  Net 300 ml   Filed Weights   12/02/23 1355 12/03/23 0322  Weight: 38.4 kg 40 kg    Physical Exam   GEN: [place] Neck: No JVD Cardiac: RRR, no rubs, or gallops. severe mitral   Respiratory: Clear to auscultation bilaterally. GI: Soft, nontender, non-distended  MS: No edema  Labs   Telemetry: SR with 1st HB and bi-atrial enlargement   Chemistry Recent Labs  Lab 11/30/23 1722 12/02/23 1449  NA 138 138  K 3.1* 3.9  CL 94* 95*  CO2 32 31  GLUCOSE 99 132*  BUN 21 23  CREATININE 0.83 0.75  CALCIUM 9.2 9.4  PROT 7.4 6.6  ALBUMIN 3.7 3.0*  AST 23 18  ALT 14 12  ALKPHOS 68 49  BILITOT 0.6 0.3  GFRNONAA >60 >60  ANIONGAP 12 12     Hematology Recent Labs  Lab 11/30/23 1722 12/02/23 1449  WBC 12.9* 10.7*  RBC 3.65* 3.19*  HGB 10.4* 9.0*  HCT 32.6* 28.6*  MCV 89.3 89.7  MCH 28.5 28.2  MCHC 31.9 31.5  RDW 14.9 14.8  PLT 448* 357   BNP Recent Labs  Lab 11/30/23 1752 12/02/23 1449  BNP 208.5* 746.7*     Cardiac Studies   Cardiac Studies & Procedures   ______________________________________________________________________________________________     ECHOCARDIOGRAM  ECHOCARDIOGRAM COMPLETE 11/15/2023  Narrative ECHOCARDIOGRAM REPORT    Patient Name:   [name] Date of Exam: 11/15/2023 Medical Rec #:  [id number]         Height:       60.0  in Accession #:    [id number]        Weight:       92.0 lb Date of Birth:  [birth year]        BSA:          1.342 m Patient Age:    73 years          BP:           135/93 mmHg Patient Gender: F                 HR:           133 bpm. Exam Location:  Inpatient  Procedure: 2D Echo, Color Doppler and Cardiac Doppler (Both Spectral and Color Flow Doppler were utilized during procedure).  Indications:    Atrial fibrillation  History:        Patient has prior history of Echocardiogram examinations. Cardiomyopathy, COPD, Arrythmias:Tachycardia; Risk Factors:Former Smoker.  Sonographer:    Lamont Snowball Referring Phys: [id number] SARA-MAIZ A THOMAS  IMPRESSIONS   1. Left ventricular ejection fraction, by estimation, is 60 to 65%. The left ventricle has normal function. The left ventricle has no  regional wall motion abnormalities. Left ventricular diastolic function could not be evaluated. 2. Right ventricular systolic function is normal. The right ventricular size is normal. There is normal pulmonary artery systolic pressure. The estimated right ventricular systolic pressure is 35.8 mmHg. 3. Left atrial size was severely dilated. 4. Right atrial size was severely dilated. 5. Moderate pericardial effusion. The pericardial effusion is posterior to the left ventricle. There is no evidence of cardiac tamponade. 6. The mitral valve is abnormal. Severe mitral valve regurgitation. No evidence of mitral stenosis. 7. The aortic valve is tricuspid. There is mild calcification of the aortic valve. Aortic valve regurgitation is not visualized. Aortic valve sclerosis/calcification is present, without any evidence of aortic stenosis. 8. Aortic dilatation noted. There is mild dilatation of the ascending aorta, measuring 41 mm. 9. The inferior vena cava is dilated in size with <50% respiratory variability, suggesting right atrial pressure of 15 mmHg.  FINDINGS Left Ventricle: Left ventricular ejection fraction,  by estimation, is 60 to 65%. The left ventricle has normal function. The left ventricle has no regional wall motion abnormalities. The left ventricular internal cavity size was normal in size. There is no left ventricular hypertrophy. Left ventricular diastolic function could not be evaluated due to atrial fibrillation. Left ventricular diastolic function could not be evaluated.  Right Ventricle: The right ventricular size is normal. No increase in right ventricular wall thickness. Right ventricular systolic function is normal. There is normal pulmonary artery systolic pressure. The tricuspid regurgitant velocity is 2.28 m/s, and with an assumed right atrial pressure of 15 mmHg, the estimated right ventricular systolic pressure is 35.8 mmHg.  Left Atrium: Left atrial size was severely dilated.  Right Atrium: Right atrial size was severely dilated.  Pericardium: A moderately sized pericardial effusion is present. The pericardial effusion is posterior to the left ventricle. There is no evidence of cardiac tamponade.  Mitral Valve: The mitral valve is abnormal. Severe mitral valve regurgitation, with centrally-directed jet. No evidence of mitral valve stenosis. MV peak gradient, 10.2 mmHg. The mean mitral valve gradient is 4.3 mmHg.  Tricuspid Valve: The tricuspid valve is normal in structure. Tricuspid valve regurgitation is mild . No evidence of tricuspid stenosis.  Aortic Valve: The aortic valve is tricuspid. There is mild calcification of the aortic valve. Aortic valve regurgitation is not visualized. Aortic valve sclerosis/calcification is present, without any evidence of aortic stenosis. Aortic valve peak gradient measures 7.8 mmHg.  Pulmonic Valve: The pulmonic valve was normal in structure. Pulmonic valve regurgitation is trivial. No evidence of pulmonic stenosis.  Aorta: Aortic dilatation noted. There is mild dilatation of the ascending aorta, measuring 41 mm.  Venous: The inferior vena  cava is dilated in size with less than 50% respiratory variability, suggesting right atrial pressure of 15 mmHg.  IAS/Shunts: No atrial level shunt detected by color flow Doppler.   LEFT VENTRICLE PLAX 2D LVIDd:         4.60 cm LVIDs:         3.00 cm LV PW:         1.30 cm LV IVS:        0.60 cm LVOT diam:     1.90 cm LV SV:         50 LV SV Index:   37 LVOT Area:     2.84 cm   RIGHT VENTRICLE          IVC RV Basal diam:  3.10 cm  IVC diam: 2.10 cm  LEFT ATRIUM  Index        RIGHT ATRIUM           Index LA diam:        4.50 cm 3.35 cm/m   RA Area:     20.90 cm LA Vol (A2C):   63.4 ml 47.25 ml/m  RA Volume:   51.50 ml  38.38 ml/m LA Vol (A4C):   57.9 ml 43.15 ml/m LA Biplane Vol: 64.8 ml 48.29 ml/m AORTIC VALVE AV Area (Vmax): 2.54 cm AV Vmax:        139.67 cm/s AV Peak Grad:   7.8 mmHg LVOT Vmax:      125.33 cm/s LVOT Vmean:     79.300 cm/s LVOT VTI:       0.175 m  AORTA Ao Root diam: 3.70 cm Ao Asc diam:  4.10 cm  MITRAL VALVE                 TRICUSPID VALVE MV Area (PHT): 4.79 cm      TR Peak grad:   20.8 mmHg MV Area VTI:   2.55 cm      TR Vmax:        228.00 cm/s MV Peak grad:  10.2 mmHg MV Mean grad:  4.3 mmHg      SHUNTS MV Vmax:       1.59 m/s      Systemic VTI:  0.17 m MV Vmean:      94.9 cm/s     Systemic Diam: 1.90 cm MV Decel Time: 158 msec MR Peak grad:   115.3 mmHg MR Mean grad:   86.0 mmHg MR Vmax:        537.00 cm/s MR Vmean:       445.5 cm/s MR PISA:        4.02 cm MR PISA Radius: 0.80 cm MV E velocity: 149.67 cm/s  Arvilla Meres MD Electronically signed by Arvilla Meres MD Signature Date/Time: 11/15/2023/11:11:39 AM    Final   TEE  ECHO TEE 11/16/2023  Narrative TRANSESOPHOGEAL ECHO REPORT    Patient Name:   ANUSHRI CASALINO Date of Exam: 11/16/2023 Medical Rec #:  [id number]         Height:       60.0 in Accession #:    [id number]        Weight:       77.6 lb Date of Birth:  [birth year]        BSA:           1.248 m Patient Age:    73 years          BP:           111/66 mmHg Patient Gender: F                 HR:           99 bpm. Exam Location:  Inpatient  Procedure: Color Doppler, Cardiac Doppler and Transesophageal Echo (Both Spectral and Color Flow Doppler were utilized during procedure).  Indications:     Cardioversion  History:         Patient has prior history of Echocardiogram examinations, most recent 11/15/2023.  Sonographer:     Harriette Bouillon RDCS Referring Phys:  [id number] ZANE ADAMS Diagnosing Phys: Thomasene Ripple DO  PROCEDURE: The transesophogeal probe was passed without difficulty through the esophogus of the patient. Sedation performed by different physician. The patient developed no complications during the procedure.  IMPRESSIONS   1. Left ventricular  ejection fraction, by estimation, is 55 to 60%. The left ventricle has normal function. 2. Right ventricular systolic function is normal. The right ventricular size is normal. 3. Left atrial size was mildly dilated. No left atrial/left atrial appendage thrombus was detected. 4. The mitral valve is abnormal. Severe mitral valve regurgitation. No evidence of mitral stenosis. 5. The tricuspid valve is abnormal. Tricuspid valve regurgitation is mild to moderate. 6. The aortic valve is tricuspid. Aortic valve regurgitation is trivial. No aortic stenosis is present. 7. There is borderline dilatation of the ascending aorta, measuring 36 mm.  FINDINGS Left Ventricle: Left ventricular ejection fraction, by estimation, is 55 to 60%. The left ventricle has normal function. The left ventricular internal cavity size was small.  Right Ventricle: The right ventricular size is normal. No increase in right ventricular wall thickness. Right ventricular systolic function is normal.  Left Atrium: Left atrial size was mildly dilated. No left atrial/left atrial appendage thrombus was detected.  Right Atrium: Right atrial size was not well  visualized.  Pericardium: Trivial pericardial effusion is present. The pericardial effusion is circumferential.  Mitral Valve: The mitral valve is abnormal. Severe mitral valve regurgitation. No evidence of mitral valve stenosis.  Tricuspid Valve: The tricuspid valve is abnormal. Tricuspid valve regurgitation is mild to moderate. No evidence of tricuspid stenosis.  Aortic Valve: The aortic valve is tricuspid. Aortic valve regurgitation is trivial. No aortic stenosis is present.  Pulmonic Valve: The pulmonic valve was not well visualized. Pulmonic valve regurgitation is trivial. No evidence of pulmonic stenosis.  Aorta: There is borderline dilatation of the ascending aorta, measuring 36 mm. There is minimal (Grade I) layered plaque involving the aortic root.  Pulmonary Artery: The pulmonary artery is of normal size.  IAS/Shunts: No atrial level shunt detected by color flow Doppler.    AORTA Ao Asc diam: 3.60 cm  Kardie Tobb DO Electronically signed by Thomasene Ripple DO Signature Date/Time: 11/16/2023/6:05:04 PM    Final  MONITORS  LONG TERM MONITOR (3-14 DAYS) 03/17/2023  Narrative The predominant rhythm was normal sinus. There were runs of supraventricular tachycardia noted with the longest lasting 11 seconds Rare ventricular ectopy.       ______________________________________________________________________________________________        Assessment & Plan   Acute diastolic heart failure with severe MR (atrial functional MR) - Will increase diuretic today if able - she is a procedural candidate for mTEER with posterior restriction; mild aortic dilation will lead to off-axis images; has suitable height - I am unclear if her frailty would keep her here; She has had multiple admissions and is on 3 L O2 at baseline - care limited by hypotension; this is slowly worsening - has TR, non severe  - will ask structural to see (if inpatient- will be seen Tuesday- otherwise  will get close outpatient f/u), consider PC - Hypotension dose not appear to be cardiac, I am concerned she is failure to thrive; I have repeated albumin administration; daughter notes poor PO intake at [facility], nutrition consult is pending - Called her daughter in regard to mTEER eval and care plan   Persistent AF - continue AC and amiodarone, stop AV nodal agents outside of this    CAC - outpatient management as indicated; no evidence of CAD  COPD -  On chronic 3 L oxygen at home 24/7 since April 2024     Will see one of my partners 12/04/23  For questions or updates, please contact [facility] Please consult [email or web address] for contact  info under Cardiology/STEMI.      Riley Lam, MD FASE [facility] Cardiologist [facility]  [address] [place], [address] [place], [place] [address] [phone]  9:33 AM

## 2023-12-03 NOTE — Hospital Course (Addendum)
 [name] is a 73 y.o. female with medical history significant for COPD on 3 L nasal cannula continuously, nonischemic cardiomyopathy, hyperlipidemia, GERD, recent bimalleolar ankle fracture after fall, paroxysmal A-fib/A-flutter on Eliquis, history of severe MR, moderate pericardial effusion, chronic HFrEF, who presents to the ER via EMS from SNF due to worsening shortness of breath.  Associated with generalized weakness and intermittent palpitations.  No reported chest pain.   In the ER, cardiomegaly with concern for edema on chest x-ray.  BNP elevated greater than 700.  Bilateral lower extremity edema.  The patient received IV Lasix in the ER.  EDP discussed the case with cardiology who recommended medicine admission for further diuresis and management of acute on chronic HFpEF.  Admitted by [facility], hospitalist service.   **Interim History Diuresis was initially held given her low blood pressure so she is been initiated on midodrine and proceed w/ diuresis now that BP improved.  PT OT recommended SNF.  Cardiology following and will get the structural heart team to evaluate. Palliative Care Medicine now evaluating and patient wishes for full scope of care and wants to remain FULL Code.   Today her labs worsened and her respiratory status continued worsen.  Renal function also worsened and she was hypoperfusing as her lactic acid level continue to trend up.  PCCM and advanced heart failure team were consulted.  Assessment and Plan:  Acute on Chronic Diastolic HFpEF / Sever MR : Presented with BNP of 746.7 and worsened to 2,749.1 yesterday and today was 2814.3. Has lower extremity edema, progressive dyspnea. IV diuresing initiated in the ER but held now due to BP. Seen by Cardiology and getting IV Albumin and Midodrine. Start strict I's and O's and daily weight. Recent echo done on 11/16/2023 revealed LVEF 60 to 65% and no regional wall motion abnormalities.  Resumed Diuretics with IV Lasix 20 mg BID  and give an additional 20 mg x1. Per Cardiology she is a procedural candidate for MTEER with posterior restriction and mild aortic dilatation we will due to off axis images.  They are recommending structural heart team to evaluate. Palliative care consultation for goals of care discussion. PT/OT recommending SNF.  Advanced heart failure team consulted  Shock with hypoperfusion resulting in AKI, acute liver injury and lactic acidosis - Patient is hypoperfusing LFTs acutely worsened and started renal function - Advanced heart failure team consulted by cardiology and they are recommending PICC line placement him checking CVP - WBC is worsening so she has been initiated on antibiotics. - Will continue diuresis per cardiology given that she appears volume overloaded - PCCM also consulted for further evaluation; she has not been initiated on broad-spectrum antibiotics and getting pancultured  Shock liver/acute liver injury: AST and ALT Trend: Recent Labs  Lab 11/15/23 0400 11/30/23 1722 12/02/23 1449 12/04/23 0309 12/05/23 0308 12/05/23 1624  AST 65* 23 18 29  3,509* 2,575*  ALT 17 14 12 18  2,153* 2,215*  BILITOT 1.9* 0.6 0.3 0.4 0.7 0.6  - Continue monitor and trend and repeat CMP in the a.m. and will be obtaining a right upper quadrant ultrasound and acute hepatitis panel which is negative. Right upper quadrant ultrasound done showed "Portal venous system is patent with normal direction of flow. Concern for gallbladder wall thickening and pericholecystic fluid. In addition, there appears to be trace perihepatic ascites. Study has technical limitations and the spleen is poorly characterized."  AKI: BUN/Cr Trend: Recent Labs  Lab 11/24/23 1522 11/30/23 1722 12/02/23 1449 12/03/23 1037 12/04/23 0309  12/05/23 0308 12/05/23 1624  BUN 24* 21 23 24* 23 39* 42*  CREATININE 0.95 0.83 0.75 0.85 0.90 1.20* 1.04*  -Avoid Nephrotoxic Medications, Contrast Dyes, Hypotension and Dehydration to Ensure  Adequate Renal Perfusion and will need to Renally Adjust Meds -Repeat UA showed cloudy appearance with amber color urine, small hemoglobin, 100 protein, rare bacteria, 11-20 WBCs.  Urine osmolality was 751, urine sodium 17, urine creatinine was 161 -Continue to Monitor and Trend Renal Function carefully and repeat CMP in the AM   Acute on Chronic Respiratory Failure with Hypoxia / Hx of COPD on 3 L: Worsening change to Xopenex/Atrovent q6h, Brovana and Budesonide. Add Flutter Valve and Incentive Spirometry.  Maintain O2 saturation above 90%. SpO2: 98 % O2 Flow Rate (L/min): 6 L/min. Repeat CXR this AM pending    Hypotension, improved: Held Antihypertensives. C/w Midodrine 10 mg po TID. Given IV Albumin 12.5 grams x1 yesterday Maintain MAP greater than 65. Closely monitor vital signs while on diuretics    Persistent A-fib/A-flutter on Eliquis: Resume home Apixaban 5 mg po BID; Rate was controlled but went into RVR overnight. CTM on Telemetry. Given IV Metoprolol 5 mg q2hprn for HR >115. Holding Antihypertensives and beta-blocking and AV nodal blocking agents given Soft BP.  Went into A-fib with RVR and cardiology had initiated IV amiodarone but was discontinued given her shock liver  Dysphagia: Having trouble swallowing pills per report. SLP evaluated and now recommending Regular Diet with Thin Liquids but since she is still having issues will have SLP re-evaluate and place back on Soft Diet .   Hypokalemia: K+ is now 3.8. CTM and Replete as Necessary. Repeat CMP in the AM   Hyperlipidemia: Discontinue atorvastatin 40 mg po Daily   Chronic Anxiety/Depression: C/w Citalopram 20 mg po Dally    Normocytic Anemia: Hgb/Hct went from 9.0/28.6 -> 8.2/26.6 -> 8.6/27.6 -> 8.8/27.5. Checked Anemia Panel showed an iron level 86, UIBC 278, TIBC 364, saturation ratios of 24%, ferritin level greater than 7500, folate level greater than 40, vitamin B12 level 4049 CTM for S/Sx of Bleeding; No overt bleeding  noted. Repeat CBC in the AM   Generalized Weakness and Deconditioning: PT/OT recommending SNF once medically stable. Fall precautions  Hypoalbuminemia: Patient's Albumin Level went from 3.7 -> 3.0 -> 3.3 -> 3.8 -> 3.6. CTM and Trend and repeat CMP in the AM  Severe Protein Calorie Malnutrition/ Underweight and failure to thrive: Nutrition Status- Nutrition Problem: Severe Malnutrition Etiology: chronic illness (COPD, HFpEF) Signs/Symptoms: moderate muscle depletion, severe fat depletion, severe muscle depletion Interventions: Magic cup, Liberalize Diet, Refer to RD note for recommendations Estimated body mass index is 16.02 kg/m as calculated from the following:   Height as of this encounter: 5' (1.524 m).   Weight as of this encounter: 37.2 kg.

## 2023-12-03 NOTE — Progress Notes (Signed)
 Initial Nutrition Assessment  DOCUMENTATION CODES:   Severe malnutrition in context of chronic illness, Underweight  INTERVENTION:   - Liberalize diet to Regular to provide maximum food options at meal times  - Magic Cup TID with meals, each supplement provides 290 kcal and 9 grams of protein  - Recommend SLP evaluation  NUTRITION DIAGNOSIS:   Severe Malnutrition related to chronic illness (COPD, HFpEF) as evidenced by moderate muscle depletion, severe fat depletion, severe muscle depletion.  GOAL:   Patient will meet greater than or equal to 90% of their needs  MONITOR:   PO intake, Supplement acceptance, Weight trends, I & O's  REASON FOR ASSESSMENT:   Consult Assessment of nutrition requirement/status  ASSESSMENT:   73 year old female who presented to the ED on 4/03 with SOB. PMH of non-ischemic cardiomyopathy and HF with recovered LVEF, severe mitral regurgitation, COPD on chronic home oxygen therapy, paroxysmal atrial fibrillation, atrial tachycardia, HLD, GERD, fibromyalgia, recent bimalleolar ankle fracture after fall, moderate pericardial effusion. Pt admitted with acute on chronic HFpEF.  Met with pt and sister at bedside. Pt reports that she ate well at breakfast (almost all of the eggs and breakfast potatoes) but that now she is feeling nauseous which she attributes to all of the pills she had to take. RN made aware and able to provide PRN anti-nausea medication.  Pt reports that she typically eats 2 meals daily at home. She does not often eat breakfast, but if she does, it is cereal with 2% milk. Lunch is usually half of a TV dinner (green beans, mac and cheese, mashed potatoes) while dinner is the other half of the TV dinner. Pt does not eat snacks and drinks water and cranberry juice throughout the day. Recently pt has been at [facility] where they have been bringing cheeseburger pie or a chili cheese hotdog. Pt does not eat red meat and therefore does not eat these  food items. Pt reports that only recently was she offered a different option that wasn't red meat. Pt endorses early satiety and attributes this to "the fluid on my abdomen."  Pt and sister deny pt experiencing any recent weight loss. Pt reports that her weight fluctuates due to fluid status. Pt reports UBW range as 85-90 lbs. Pt states that she has always weighed this much but sister shares that pt's highest adult weight was 105 lbs. Pt reports losing 20 lbs about 20 years ago when she had gastritis. Reviewed weight history in chart. Pt with a 3.5 kg (8%) weight loss from 09/16/23 to 12/03/23. This is significant for timeframe but may be related to fluid status as pt's weight has fluctuated between 38-43.5 kg over the last 3 months.  Pt endorses difficulty swallowing. She states that she feels like food and medications get stuck in her throat. She is amenable to an SLP evaluation. Have reached out to MD via secure chat to discuss.  Pt reports that she typically takes a MVI, magnesium, zinc, and calcium. Will hold off on order MVI at this time as pt reports she is already taking too many pills.  Pt meets criteria for severe malnutrition based on NFPE. Pt does not like Boost or Ensure but is willing to try Magic Cups. Per RD note from recent previous admission, Magic Cups were ordered but pt reports never having received them. Will also liberalize diet to Regular to promote PO intake and provide maximum options at meals.  Admit weight: 38.4 kg Current weight: 40 kg  Meal Completion: 75%  x 1 documented meal  Medications reviewed.  Labs reviewed: chloride 95, WBC 10.7, hemoglobin 9.0  NUTRITION - FOCUSED PHYSICAL EXAM:  Flowsheet Row Most Recent Value  Orbital Region Severe depletion  Upper Arm Region Severe depletion  Thoracic and Lumbar Region Severe depletion  Buccal Region Severe depletion  Temple Region Severe depletion  Clavicle Bone Region Severe depletion  Clavicle and Acromion Bone  Region Severe depletion  Scapular Bone Region Severe depletion  Dorsal Hand Severe depletion  Patellar Region Severe depletion  Anterior Thigh Region Severe depletion  Posterior Calf Region Severe depletion  Edema (RD Assessment) Mild  Hair Reviewed  Eyes Reviewed  Mouth Reviewed  [missing teeth]  Skin Reviewed  Nails Reviewed  [artificial nails]    Diet Order:   Diet Order             Diet regular Room service appropriate? Yes with Assist; Fluid consistency: Thin  Diet effective now                   EDUCATION NEEDS:   Education needs have been addressed  Skin:  Skin Assessment: Reviewed RN Assessment  Last BM:  12/03/23 medium type 4  Height:   Ht Readings from Last 1 Encounters:  12/02/23 5' (1.524 m)    Weight:   Wt Readings from Last 1 Encounters:  12/03/23 40 kg    BMI:  Body mass index is 17.22 kg/m.  Estimated Nutritional Needs:   Kcal:  1350-1550  Protein:  65-75 grams  Fluid:  >1.5 L    Mertie Clause, MS, RD, LDN Registered Dietitian II Please see AMiON for contact information.

## 2023-12-03 NOTE — Progress Notes (Signed)
 Heart Failure Navigator Progress Note  Assessed for Heart & Vascular TOC clinic readiness.  Patient does not meet criteria due to . EF 60-65%, has a scheduled  [facility] appointment on 4/8; was seen in [facility] 3/26 .   Navigator will sign off at this time.   Rhae Hammock, BSN, Scientist, clinical (histocompatibility and immunogenetics) Only

## 2023-12-04 ENCOUNTER — Inpatient Hospital Stay (HOSPITAL_COMMUNITY)

## 2023-12-04 DIAGNOSIS — Z515 Encounter for palliative care: Secondary | ICD-10-CM | POA: Diagnosis not present

## 2023-12-04 DIAGNOSIS — J9621 Acute and chronic respiratory failure with hypoxia: Secondary | ICD-10-CM

## 2023-12-04 DIAGNOSIS — I5033 Acute on chronic diastolic (congestive) heart failure: Secondary | ICD-10-CM | POA: Diagnosis not present

## 2023-12-04 DIAGNOSIS — I480 Paroxysmal atrial fibrillation: Secondary | ICD-10-CM | POA: Diagnosis not present

## 2023-12-04 DIAGNOSIS — I340 Nonrheumatic mitral (valve) insufficiency: Secondary | ICD-10-CM | POA: Diagnosis not present

## 2023-12-04 DIAGNOSIS — I3139 Other pericardial effusion (noninflammatory): Secondary | ICD-10-CM | POA: Diagnosis not present

## 2023-12-04 LAB — COMPREHENSIVE METABOLIC PANEL WITH GFR
ALT: 18 U/L (ref 0–44)
AST: 29 U/L (ref 15–41)
Albumin: 3.3 g/dL — ABNORMAL LOW (ref 3.5–5.0)
Alkaline Phosphatase: 56 U/L (ref 38–126)
Anion gap: 13 (ref 5–15)
BUN: 23 mg/dL (ref 8–23)
CO2: 31 mmol/L (ref 22–32)
Calcium: 9.2 mg/dL (ref 8.9–10.3)
Chloride: 96 mmol/L — ABNORMAL LOW (ref 98–111)
Creatinine, Ser: 0.9 mg/dL (ref 0.44–1.00)
GFR, Estimated: >60 mL/min (ref >60)
Glucose, Bld: 279 mg/dL — ABNORMAL HIGH (ref 70–99)
Potassium: 3.5 mmol/L (ref 3.5–5.1)
Sodium: 140 mmol/L (ref 135–145)
Total Bilirubin: 0.4 mg/dL (ref 0.0–1.2)
Total Protein: 6.5 g/dL (ref 6.5–8.1)

## 2023-12-04 LAB — CBC WITH DIFFERENTIAL/PLATELET
Abs Immature Granulocytes: 0.07 10*3/uL (ref 0.00–0.07)
Basophils Absolute: 0 10*3/uL (ref 0.0–0.1)
Basophils Relative: 0 %
Eosinophils Absolute: 0 10*3/uL (ref 0.0–0.5)
Eosinophils Relative: 0 %
HCT: 27.6 % — ABNORMAL LOW (ref 36.0–46.0)
Hemoglobin: 8.6 g/dL — ABNORMAL LOW (ref 12.0–15.0)
Immature Granulocytes: 1 %
Lymphocytes Relative: 6 %
Lymphs Abs: 0.7 10*3/uL (ref 0.7–4.0)
MCH: 28.2 pg (ref 26.0–34.0)
MCHC: 31.2 g/dL (ref 30.0–36.0)
MCV: 90.5 fL (ref 80.0–100.0)
Monocytes Absolute: 0.9 10*3/uL (ref 0.1–1.0)
Monocytes Relative: 8 %
Neutro Abs: 10.3 10*3/uL — ABNORMAL HIGH (ref 1.7–7.7)
Neutrophils Relative %: 85 %
Platelets: 395 10*3/uL (ref 150–400)
RBC: 3.05 MIL/uL — ABNORMAL LOW (ref 3.87–5.11)
RDW: 15.1 % (ref 11.5–15.5)
WBC: 12.1 10*3/uL — ABNORMAL HIGH (ref 4.0–10.5)
nRBC: 0 % (ref 0.0–0.2)

## 2023-12-04 LAB — MAGNESIUM: Magnesium: 2 mg/dL (ref 1.7–2.4)

## 2023-12-04 LAB — BRAIN NATRIURETIC PEPTIDE: B Natriuretic Peptide: 2749.1 pg/mL — ABNORMAL HIGH (ref 0.0–100.0)

## 2023-12-04 LAB — PHOSPHORUS: Phosphorus: 4.6 mg/dL (ref 2.5–4.6)

## 2023-12-04 MED ORDER — FUROSEMIDE 10 MG/ML IJ SOLN
20.0000 mg | Freq: Two times a day (BID) | INTRAMUSCULAR | Status: DC
  Administered 2023-12-04 – 2023-12-07 (×7): 20 mg via INTRAVENOUS
  Filled 2023-12-04 (×7): qty 2

## 2023-12-04 MED ORDER — ARFORMOTEROL TARTRATE 15 MCG/2ML IN NEBU
15.0000 ug | INHALATION_SOLUTION | Freq: Two times a day (BID) | RESPIRATORY_TRACT | Status: DC
  Administered 2023-12-04 – 2023-12-05 (×3): 15 ug via RESPIRATORY_TRACT
  Filled 2023-12-04 (×3): qty 2

## 2023-12-04 MED ORDER — IPRATROPIUM BROMIDE 0.02 % IN SOLN
0.5000 mg | Freq: Four times a day (QID) | RESPIRATORY_TRACT | Status: DC
  Administered 2023-12-04 – 2023-12-06 (×7): 0.5 mg via RESPIRATORY_TRACT
  Filled 2023-12-04 (×7): qty 2.5

## 2023-12-04 MED ORDER — BUDESONIDE 0.25 MG/2ML IN SUSP
0.2500 mg | Freq: Two times a day (BID) | RESPIRATORY_TRACT | Status: DC
Start: 2023-12-04 — End: 2023-12-08
  Administered 2023-12-04 – 2023-12-08 (×9): 0.25 mg via RESPIRATORY_TRACT
  Filled 2023-12-04 (×9): qty 2

## 2023-12-04 MED ORDER — LEVALBUTEROL HCL 0.63 MG/3ML IN NEBU
0.6300 mg | INHALATION_SOLUTION | Freq: Four times a day (QID) | RESPIRATORY_TRACT | Status: DC
  Administered 2023-12-04 – 2023-12-05 (×3): 0.63 mg via RESPIRATORY_TRACT
  Filled 2023-12-04 (×3): qty 3

## 2023-12-04 MED ORDER — FUROSEMIDE 10 MG/ML IJ SOLN
20.0000 mg | Freq: Once | INTRAMUSCULAR | Status: AC
  Administered 2023-12-04: 20 mg via INTRAVENOUS
  Filled 2023-12-04: qty 2

## 2023-12-04 MED ORDER — POTASSIUM CHLORIDE CRYS ER 20 MEQ PO TBCR
40.0000 meq | EXTENDED_RELEASE_TABLET | Freq: Two times a day (BID) | ORAL | Status: AC
  Administered 2023-12-04: 40 meq via ORAL
  Filled 2023-12-04 (×2): qty 2

## 2023-12-04 MED ORDER — METOPROLOL TARTRATE 5 MG/5ML IV SOLN
5.0000 mg | INTRAVENOUS | Status: DC | PRN
  Administered 2023-12-04 – 2023-12-05 (×3): 5 mg via INTRAVENOUS
  Filled 2023-12-04 (×3): qty 5

## 2023-12-04 MED ORDER — IPRATROPIUM-ALBUTEROL 0.5-2.5 (3) MG/3ML IN SOLN
3.0000 mL | Freq: Once | RESPIRATORY_TRACT | Status: AC
Start: 2023-12-04 — End: 2023-12-04
  Administered 2023-12-04: 3 mL via RESPIRATORY_TRACT
  Filled 2023-12-04: qty 3

## 2023-12-04 MED ORDER — METHYLPREDNISOLONE SODIUM SUCC 125 MG IJ SOLR
80.0000 mg | INTRAMUSCULAR | Status: DC
  Administered 2023-12-04: 80 mg via INTRAVENOUS
  Filled 2023-12-04: qty 2

## 2023-12-04 NOTE — Plan of Care (Signed)
  Problem: Education: Goal: Knowledge of General Education information will improve Description: Including pain rating scale, medication(s)/side effects and non-pharmacologic comfort measures Outcome: Progressing   Problem: Pain Managment: Goal: General experience of comfort will improve and/or be controlled Outcome: Progressing

## 2023-12-04 NOTE — Progress Notes (Signed)
   12/04/23 1245  Assess: MEWS Score  Temp 97.7 F (36.5 C)  BP 113/67  MAP (mmHg) 83  Pulse Rate 83  ECG Heart Rate 84  Resp (!) 33  SpO2 98 %  O2 Device HFNC  Assess: MEWS Score  MEWS Temp 0  MEWS Systolic 0  MEWS Pulse 0  MEWS RR 2  MEWS LOC 0  MEWS Score 2  MEWS Score Color Yellow  Assess: if the MEWS score is Yellow or Red  MEWS guidelines implemented  No, previously yellow, continue vital signs every 4 hours  Notify: Charge Nurse/RN  Name of Charge Nurse/RN Notified Jen  Assess: SIRS CRITERIA  SIRS Temperature  0  SIRS Respirations  1  SIRS Pulse 0  SIRS WBC 1  SIRS Score Sum  2

## 2023-12-04 NOTE — Progress Notes (Signed)
 Progress Note  Patient Name: [name] Date of Encounter: 12/04/2023 Primary Cardiologist: Rollene Rotunda, MD   Subjective   Anxious  SOB   Vital Signs    Vitals:   12/04/23 0530 12/04/23 0600 12/04/23 0615 12/04/23 0719  BP:      Pulse: 86 88 87   Resp: (!) 29 (!) 28 (!) 22 20  Temp:      TempSrc:      SpO2: 93% 92% 93%   Weight: 38 kg     Height:        Intake/Output Summary (Last 24 hours) at 12/04/2023 0734 Last data filed at 12/03/2023 1610 Gross per 24 hour  Intake 451.8 ml  Output 100 ml  Net 351.8 ml   Filed Weights   12/02/23 1355 12/03/23 0322 12/04/23 0530  Weight: 38.4 kg 40 kg 38 kg    Physical Exam   GEN: [place]: No JVD Cardiac: RRR, II/VI systolic murmur  Respiratory: Decreased airflow   Rles at bases   GI: Soft, nontender, non-distended  MS: No edema  Labs   Telemetry: Afib and SR  60s to 150   Chemistry Recent Labs  Lab 11/30/23 1722 12/02/23 1449 12/03/23 1037 12/04/23 0309  NA 138 138 139 140  K 3.1* 3.9 3.4* 3.5  CL 94* 95* 95* 96*  CO2 32 31 33* 31  GLUCOSE 99 132* 88 279*  BUN 21 23 24* 23  CREATININE 0.83 0.75 0.85 0.90  CALCIUM 9.2 9.4 9.1 9.2  PROT 7.4 6.6  --  6.5  ALBUMIN 3.7 3.0*  --  3.3*  AST 23 18  --  29  ALT 14 12  --  18  ALKPHOS 68 49  --  56  BILITOT 0.6 0.3  --  0.4  GFRNONAA >60 >60 >60 >60  ANIONGAP 12 12 11 13      Hematology Recent Labs  Lab 12/02/23 1449 12/03/23 1037 12/04/23 0309  WBC 10.7* 8.2 12.1*  RBC 3.19* 2.97* 3.05*  HGB 9.0* 8.2* 8.6*  HCT 28.6* 26.6* 27.6*  MCV 89.7 89.6 90.5  MCH 28.2 27.6 28.2  MCHC 31.5 30.8 31.2  RDW 14.8 15.0 15.1  PLT 357 397 395   BNP Recent Labs  Lab 11/30/23 1752 12/02/23 1449 12/04/23 0309  BNP 208.5* 746.7* 2,749.1*     Cardiac Studies   Cardiac Studies & Procedures   ______________________________________________________________________________________________     ECHOCARDIOGRAM  ECHOCARDIOGRAM COMPLETE  11/15/2023  Narrative ECHOCARDIOGRAM REPORT    Patient Name:   [name] Date of Exam: 11/15/2023 Medical Rec #:  [id number]         Height:       60.0 in Accession #:    [id number]        Weight:       92.0 lb Date of Birth:  19-Apr-1951        BSA:          1.342 m Patient Age:    72 years          BP:           135/93 mmHg Patient Gender: F                 HR:           133 bpm. Exam Location:  Inpatient  Procedure: 2D Echo, Color Doppler and Cardiac Doppler (Both Spectral and Color Flow Doppler were utilized during procedure).  Indications:    Atrial fibrillation  History:        Patient has prior history of Echocardiogram examinations. Cardiomyopathy, COPD, Arrythmias:Tachycardia; Risk Factors:Former Smoker.  Sonographer:    Lamont Snowball Referring Phys: [id number] SARA-MAIZ A THOMAS  IMPRESSIONS   1. Left ventricular ejection fraction, by estimation, is 60 to 65%. The left ventricle has normal function. The left ventricle has no regional wall motion abnormalities. Left ventricular diastolic function could not be evaluated. 2. Right ventricular systolic function is normal. The right ventricular size is normal. There is normal pulmonary artery systolic pressure. The estimated right ventricular systolic pressure is 35.8 mmHg. 3. Left atrial size was severely dilated. 4. Right atrial size was severely dilated. 5. Moderate pericardial effusion. The pericardial effusion is posterior to the left ventricle. There is no evidence of cardiac tamponade. 6. The mitral valve is abnormal. Severe mitral valve regurgitation. No evidence of mitral stenosis. 7. The aortic valve is tricuspid. There is mild calcification of the aortic valve. Aortic valve regurgitation is not visualized. Aortic valve sclerosis/calcification is present, without any evidence of aortic stenosis. 8. Aortic dilatation noted. There is mild dilatation of the ascending aorta, measuring 41 mm. 9. The inferior vena  cava is dilated in size with <50% respiratory variability, suggesting right atrial pressure of 15 mmHg.  FINDINGS Left Ventricle: Left ventricular ejection fraction, by estimation, is 60 to 65%. The left ventricle has normal function. The left ventricle has no regional wall motion abnormalities. The left ventricular internal cavity size was normal in size. There is no left ventricular hypertrophy. Left ventricular diastolic function could not be evaluated due to atrial fibrillation. Left ventricular diastolic function could not be evaluated.  Right Ventricle: The right ventricular size is normal. No increase in right ventricular wall thickness. Right ventricular systolic function is normal. There is normal pulmonary artery systolic pressure. The tricuspid regurgitant velocity is 2.28 m/s, and with an assumed right atrial pressure of 15 mmHg, the estimated right ventricular systolic pressure is 35.8 mmHg.  Left Atrium: Left atrial size was severely dilated.  Right Atrium: Right atrial size was severely dilated.  Pericardium: A moderately sized pericardial effusion is present. The pericardial effusion is posterior to the left ventricle. There is no evidence of cardiac tamponade.  Mitral Valve: The mitral valve is abnormal. Severe mitral valve regurgitation, with centrally-directed jet. No evidence of mitral valve stenosis. MV peak gradient, 10.2 mmHg. The mean mitral valve gradient is 4.3 mmHg.  Tricuspid Valve: The tricuspid valve is normal in structure. Tricuspid valve regurgitation is mild . No evidence of tricuspid stenosis.  Aortic Valve: The aortic valve is tricuspid. There is mild calcification of the aortic valve. Aortic valve regurgitation is not visualized. Aortic valve sclerosis/calcification is present, without any evidence of aortic stenosis. Aortic valve peak gradient measures 7.8 mmHg.  Pulmonic Valve: The pulmonic valve was normal in structure. Pulmonic valve regurgitation is  trivial. No evidence of pulmonic stenosis.  Aorta: Aortic dilatation noted. There is mild dilatation of the ascending aorta, measuring 41 mm.  Venous: The inferior vena cava is dilated in size with less than 50% respiratory variability, suggesting right atrial pressure of 15 mmHg.  IAS/Shunts: No atrial level shunt detected by color flow Doppler.   LEFT VENTRICLE PLAX 2D LVIDd:         4.60 cm LVIDs:         3.00 cm LV PW:         1.30 cm LV IVS:        0.60 cm LVOT diam:  1.90 cm LV SV:         50 LV SV Index:   37 LVOT Area:     2.84 cm   RIGHT VENTRICLE          IVC RV Basal diam:  3.10 cm  IVC diam: 2.10 cm  LEFT ATRIUM             Index        RIGHT ATRIUM           Index LA diam:        4.50 cm 3.35 cm/m   RA Area:     20.90 cm LA Vol (A2C):   63.4 ml 47.25 ml/m  RA Volume:   51.50 ml  38.38 ml/m LA Vol (A4C):   57.9 ml 43.15 ml/m LA Biplane Vol: 64.8 ml 48.29 ml/m AORTIC VALVE AV Area (Vmax): 2.54 cm AV Vmax:        139.67 cm/s AV Peak Grad:   7.8 mmHg LVOT Vmax:      125.33 cm/s LVOT Vmean:     79.300 cm/s LVOT VTI:       0.175 m  AORTA Ao Root diam: 3.70 cm Ao Asc diam:  4.10 cm  MITRAL VALVE                 TRICUSPID VALVE MV Area (PHT): 4.79 cm      TR Peak grad:   20.8 mmHg MV Area VTI:   2.55 cm      TR Vmax:        228.00 cm/s MV Peak grad:  10.2 mmHg MV Mean grad:  4.3 mmHg      SHUNTS MV Vmax:       1.59 m/s      Systemic VTI:  0.17 m MV Vmean:      94.9 cm/s     Systemic Diam: 1.90 cm MV Decel Time: 158 msec MR Peak grad:   115.3 mmHg MR Mean grad:   86.0 mmHg MR Vmax:        537.00 cm/s MR Vmean:       445.5 cm/s MR PISA:        4.02 cm MR PISA Radius: 0.80 cm MV E velocity: 149.67 cm/s  Arvilla Meres MD Electronically signed by Arvilla Meres MD Signature Date/Time: 11/15/2023/11:11:39 AM    Final   TEE  ECHO TEE 11/16/2023  Narrative TRANSESOPHOGEAL ECHO REPORT    Patient Name:   LORYNN MOESER Date of  Exam: 11/16/2023 Medical Rec #:  [id number]         Height:       60.0 in Accession #:    [id number]        Weight:       77.6 lb Date of Birth:  Nov 25, 1950        BSA:          1.248 m Patient Age:    72 years          BP:           111/66 mmHg Patient Gender: F                 HR:           99 bpm. Exam Location:  Inpatient  Procedure: Color Doppler, Cardiac Doppler and Transesophageal Echo (Both Spectral and Color Flow Doppler were utilized during procedure).  Indications:     Cardioversion  History:  Patient has prior history of Echocardiogram examinations, most recent 11/15/2023.  Sonographer:     Harriette Bouillon RDCS Referring Phys:  [id number] ZANE ADAMS Diagnosing Phys: Thomasene Ripple DO  PROCEDURE: The transesophogeal probe was passed without difficulty through the esophogus of the patient. Sedation performed by different physician. The patient developed no complications during the procedure.  IMPRESSIONS   1. Left ventricular ejection fraction, by estimation, is 55 to 60%. The left ventricle has normal function. 2. Right ventricular systolic function is normal. The right ventricular size is normal. 3. Left atrial size was mildly dilated. No left atrial/left atrial appendage thrombus was detected. 4. The mitral valve is abnormal. Severe mitral valve regurgitation. No evidence of mitral stenosis. 5. The tricuspid valve is abnormal. Tricuspid valve regurgitation is mild to moderate. 6. The aortic valve is tricuspid. Aortic valve regurgitation is trivial. No aortic stenosis is present. 7. There is borderline dilatation of the ascending aorta, measuring 36 mm.  FINDINGS Left Ventricle: Left ventricular ejection fraction, by estimation, is 55 to 60%. The left ventricle has normal function. The left ventricular internal cavity size was small.  Right Ventricle: The right ventricular size is normal. No increase in right ventricular wall thickness. Right ventricular systolic function  is normal.  Left Atrium: Left atrial size was mildly dilated. No left atrial/left atrial appendage thrombus was detected.  Right Atrium: Right atrial size was not well visualized.  Pericardium: Trivial pericardial effusion is present. The pericardial effusion is circumferential.  Mitral Valve: The mitral valve is abnormal. Severe mitral valve regurgitation. No evidence of mitral valve stenosis.  Tricuspid Valve: The tricuspid valve is abnormal. Tricuspid valve regurgitation is mild to moderate. No evidence of tricuspid stenosis.  Aortic Valve: The aortic valve is tricuspid. Aortic valve regurgitation is trivial. No aortic stenosis is present.  Pulmonic Valve: The pulmonic valve was not well visualized. Pulmonic valve regurgitation is trivial. No evidence of pulmonic stenosis.  Aorta: There is borderline dilatation of the ascending aorta, measuring 36 mm. There is minimal (Grade I) layered plaque involving the aortic root.  Pulmonary Artery: The pulmonary artery is of normal size.  IAS/Shunts: No atrial level shunt detected by color flow Doppler.    AORTA Ao Asc diam: 3.60 cm  Kardie Tobb DO Electronically signed by Thomasene Ripple DO Signature Date/Time: 11/16/2023/6:05:04 PM    Final  MONITORS  LONG TERM MONITOR (3-14 DAYS) 03/17/2023  Narrative The predominant rhythm was normal sinus. There were runs of supraventricular tachycardia noted with the longest lasting 11 seconds Rare ventricular ectopy.       ______________________________________________________________________________________________        Assessment & Plan    HFpEF    PT with severe MR    I think she would benefit from additional diuresis but effect will be transient    As noted by Merlyn Lot, pt is a possible candidate for mTEER however with other medical issues an current status will need to be evaluated by structural service       Persistent AF Pt in and out of SR/afib  Rtes 60s to 150   Continue amiodarone and anticoagulatoin  -     CAC - outpatient management as indicated; no evidence of CAD  COPD -  On chronic 3 L oxygen at home 24/7 since April 2024     Pt very anxious   Not unexpected given above   ? If anything can be added without depressing respiratory status      For questions or updates, please  contact [facility] Please consult [email or web address] for contact info under Cardiology/STEMI.   Dietrich Pates MD

## 2023-12-04 NOTE — Progress Notes (Signed)
   12/04/23 1404  Assess: MEWS Score  Pulse Rate 84  ECG Heart Rate 84  Resp (!) 27  SpO2 92 %  O2 Flow Rate (L/min) 7 L/min  Assess: MEWS Score  MEWS Temp 0  MEWS Systolic 0  MEWS Pulse 0  MEWS RR 2  MEWS LOC 0  MEWS Score 2  MEWS Score Color Yellow  Assess: if the MEWS score is Yellow or Red  MEWS guidelines implemented  No, previously yellow, continue vital signs every 4 hours  Notify: Charge Nurse/RN  Name of Charge Nurse/RN Notified Jen  Assess: SIRS CRITERIA  SIRS Temperature  0  SIRS Respirations  1  SIRS Pulse 0  SIRS WBC 1  SIRS Score Sum  2

## 2023-12-04 NOTE — Consult Note (Signed)
 Palliative Medicine  Name: [name] Date: 12/04/2023 MRN: [id number]  DOB: June 10, 1951  Patient Care Team: Melida Quitter, PA as PCP - General (Family Medicine) Rollene Rotunda, MD as PCP - Cardiology (Cardiology) Pollyann Savoy, MD as Consulting Physician (Rheumatology) Jean Rosenthal, NP (Inactive) as Nurse Practitioner (Gynecology) Sharrell Ku, MD as Consulting Physician (Gastroenterology) Cherlyn Roberts, MD as Consulting Physician (Dermatology) Waymon Budge, MD as Consulting Physician (Pulmonary Disease) Vesta Mixer (Psychiatry) Waymon Budge, MD as Consulting Physician (Pulmonary Disease)    REASON FOR CONSULTATION: [name] is a 73 y.o. female with multiple medical problems including COPD on 3 L O2, nonischemic cardiomyopathy after breast cancer treatment, diastolic dysfunction with EF of 55%, paroxysmal A-fib on Eliquis, severe MR being evaluated for MitraClip.  Patient recently hospitalized 11/14/2023 to 11/18/2023 with CHF.  Patient seen by interventional cardiology with plan for outpatient evaluation for MitraClip.  Patient readmitted 12/02/2023 with acute on chronic diastolic CHF.  Patient initially hypotensive and unable to tolerate diuretics.  She was started on midodrine and albumin.  Palliative care consulted to address goals.  SOCIAL HISTORY:     reports that she quit smoking about 33 years ago. Her smoking use included cigarettes. She started smoking about 63 years ago. She has a 30 pack-year smoking history. She has never been exposed to tobacco smoke. She has never used smokeless tobacco. She reports that she does not currently use drugs after having used the following drugs: Marijuana. She reports that she does not drink alcohol.  Patient is widowed.  She lived at home alone until several months ago.  Patient has a daughter who is involved.  Patient previously worked in Armed forces technical officer work.  She was  later disabled with chronic pain.  ADVANCE DIRECTIVES:  Does not have  CODE STATUS: Full code  PAST MEDICAL HISTORY: Past Medical History:  Diagnosis Date   Anemia    B12 deficient   Arthritis    B12 deficiency    Breast cancer (HCC)    left breast.   Chronic SI joint pain    Bilateral, Dr. Corliss Skains   COPD (chronic obstructive pulmonary disease) (HCC)    Fibromyalgia    Dr. Corliss Skains   GERD (gastroesophageal reflux disease)    Greater trochanteric bursitis of both hips    Dr. Corliss Skains   Hyperlipidemia    IBS (irritable bowel syndrome)    LV dysfunction    iatrogenic from chemotherapy ; on Carvedilol    Migraines    Osteoporosis    Dr Lomax---> transferring to a new gyn   Oxygen dependent    3 liters    PAST SURGICAL HISTORY:  Past Surgical History:  Procedure Laterality Date   ABDOMINAL HYSTERECTOMY     BSO for  Endometriosis   CARDIOVERSION N/A 11/16/2023   Procedure: CARDIOVERSION;  Surgeon: Thomasene Ripple, DO;  Location: [facility];  Service: Cardiovascular;  Laterality: N/A;   CARPAL TUNNEL RELEASE     left   CERVICAL DISC SURGERY     COLONOSCOPY W/ POLYPECTOMY  1999   Dr Kinnie Scales   ESOPHAGEAL DILATION  2005   Dr Kinnie Scales   MASTECTOMY  2009   bilateral, Dr Jamey Ripa   ORIF ANKLE FRACTURE Right 10/12/2023   Procedure: RIGHT OPEN REDUCTION INTERNAL FIXATION (ORIF) ANKLE FRACTURE;  Surgeon: Huel Cote, MD;  Location: [facility] OR;  Service: Orthopedics;  Laterality: Right;   SEPTOPLASTY     TRANSESOPHAGEAL ECHOCARDIOGRAM (CATH LAB) N/A  11/16/2023   Procedure: TRANSESOPHAGEAL ECHOCARDIOGRAM;  Surgeon: Thomasene Ripple, DO;  Location: [facility];  Service: Cardiovascular;  Laterality: N/A;    HEMATOLOGY/ONCOLOGY HISTORY:  Oncology History   No history exists.    ALLERGIES:  is allergic to amoxicillin-pot clavulanate, doxycycline, and venlafaxine.  MEDICATIONS:  Current Facility-Administered Medications  Medication Dose Route Frequency Provider Last  Rate Last Admin   acetaminophen (TYLENOL) tablet 650 mg  650 mg Oral Q6H PRN Dow Adolph N, DO       albuterol (PROVENTIL) (2.5 MG/3ML) 0.083% nebulizer solution 3 mL  3 mL Inhalation Q6H PRN Marguerita Merles [place], DO   3 mL at 12/04/23 0217   apixaban (ELIQUIS) tablet 5 mg  5 mg Oral BID Dow Adolph N, DO   5 mg at 12/04/23 0910   arformoterol (BROVANA) nebulizer solution 15 mcg  15 mcg Nebulization BID Sheikh, Omair Latif, DO       atorvastatin (LIPITOR) tablet 40 mg  40 mg Oral Daily Dow Adolph N, DO   40 mg at 12/04/23 0910   budesonide (PULMICORT) nebulizer solution 0.25 mg  0.25 mg Nebulization BID Sheikh, Omair Latif, DO       citalopram (CELEXA) tablet 20 mg  20 mg Oral Daily Dow Adolph N, DO   20 mg at 12/04/23 2952   furosemide (LASIX) injection 20 mg  20 mg Intravenous Q12H Sheikh, Kateri Mc Brenas, DO   20 mg at 12/04/23 8413   furosemide (LASIX) injection 20 mg  20 mg Intravenous Once Sheikh, Omair Latif, DO       ipratropium (ATROVENT) nebulizer solution 0.5 mg  0.5 mg Nebulization Q6H Sheikh, Omair Latif, DO       levalbuterol [facility]) nebulizer solution 0.63 mg  0.63 mg Nebulization Q6H Sheikh, Omair Latif, DO       melatonin tablet 5 mg  5 mg Oral QHS PRN Dow Adolph N, DO   5 mg at 12/03/23 2359   methylPREDNISolone sodium succinate (SOLU-MEDROL) 125 mg/2 mL injection 80 mg  80 mg Intravenous Q24H Sheikh, Omair Latif, DO       metoprolol tartrate (LOPRESSOR) injection 5 mg  5 mg Intravenous Q2H PRN Opyd, Lavone Neri, MD   5 mg at 12/04/23 0410   midodrine (PROAMATINE) tablet 10 mg  10 mg Oral TID WC Sheikh, Omair [place], DO   10 mg at 12/04/23 0910   polyethylene glycol (MIRALAX / GLYCOLAX) packet 17 g  17 g Oral Daily PRN Dow Adolph N, DO       potassium chloride SA (KLOR-CON M) CR tablet 40 mEq  40 mEq Oral BID Marguerita Merles [place], DO   40 mEq at 12/04/23 2440   prochlorperazine (COMPAZINE) injection 5 mg  5 mg Intravenous Q6H PRN Dow Adolph N, DO   5 mg at 12/04/23 1027     VITAL SIGNS: BP 120/70 (BP Location: Right Arm)   Pulse 79   Temp (!) 97.5 F (36.4 C) (Axillary)   Resp (!) 29   Ht 5' (1.524 m)   Wt 83 lb 12.4 oz (38 kg)   SpO2 95%   BMI 16.36 kg/m  Filed Weights   12/02/23 1355 12/03/23 0322 12/04/23 0530  Weight: 84 lb 10.5 oz (38.4 kg) 88 lb 2.9 oz (40 kg) 83 lb 12.4 oz (38 kg)    Estimated body mass index is 16.36 kg/m as calculated from the following:   Height as of this encounter: 5' (1.524 m).   Weight as of this encounter:  83 lb 12.4 oz (38 kg).  LABS: CBC:    Component Value Date/Time   WBC 12.1 (H) 12/04/2023 0309   HGB 8.6 (L) 12/04/2023 0309   HGB 11.1 08/30/2023 0915   HGB 11.7 07/10/2014 1004   HCT 27.6 (L) 12/04/2023 0309   HCT 33.4 (L) 08/30/2023 0915   HCT 35.7 07/10/2014 1004   PLT 395 12/04/2023 0309   PLT 348 08/30/2023 0915   MCV 90.5 12/04/2023 0309   MCV 95 08/30/2023 0915   MCV 90.4 07/10/2014 1004   NEUTROABS 10.3 (H) 12/04/2023 0309   NEUTROABS 2.4 08/30/2023 0915   NEUTROABS 2.8 07/10/2014 1004   LYMPHSABS 0.7 12/04/2023 0309   LYMPHSABS 1.8 08/30/2023 0915   LYMPHSABS 1.6 07/10/2014 1004   MONOABS 0.9 12/04/2023 0309   MONOABS 0.4 07/10/2014 1004   EOSABS 0.0 12/04/2023 0309   EOSABS 0.1 08/30/2023 0915   BASOSABS 0.0 12/04/2023 0309   BASOSABS 0.0 08/30/2023 0915   BASOSABS 0.0 07/10/2014 1004   Comprehensive Metabolic Panel:    Component Value Date/Time   NA 140 12/04/2023 0309   NA 142 08/30/2023 0915   NA 140 07/10/2014 1005   K 3.5 12/04/2023 0309   K 3.6 07/10/2014 1005   CL 96 (L) 12/04/2023 0309   CL 102 09/27/2012 0936   CO2 31 12/04/2023 0309   CO2 28 07/10/2014 1005   BUN 23 12/04/2023 0309   BUN 21 08/30/2023 0915   BUN 17.7 07/10/2014 1005   CREATININE 0.90 12/04/2023 0309   CREATININE 0.87 11/24/2016 1059   CREATININE 1.0 07/10/2014 1005   GLUCOSE 279 (H) 12/04/2023 0309   GLUCOSE 123 07/10/2014 1005   GLUCOSE 96 09/27/2012 0936   CALCIUM 9.2 12/04/2023 0309    CALCIUM 9.3 07/10/2014 1005   AST 29 12/04/2023 0309   AST 22 07/10/2014 1005   ALT 18 12/04/2023 0309   ALT 13 07/10/2014 1005   ALKPHOS 56 12/04/2023 0309   ALKPHOS 59 07/10/2014 1005   BILITOT 0.4 12/04/2023 0309   BILITOT 0.3 08/30/2023 0915   BILITOT 0.36 07/10/2014 1005   PROT 6.5 12/04/2023 0309   PROT 6.8 08/30/2023 0915   PROT 6.8 07/10/2014 1005   ALBUMIN 3.3 (L) 12/04/2023 0309   ALBUMIN 4.4 08/30/2023 0915   ALBUMIN 4.0 07/10/2014 1005    RADIOGRAPHIC STUDIES: DG Chest Port 1 View Result Date: 12/02/2023 CLINICAL DATA:  Shortness of breath. EXAM: PORTABLE CHEST 1 VIEW COMPARISON:  11/30/2023. FINDINGS: Stable cardiomediastinal silhouette. Emphysematous changes again seen. Similar bibasilar atelectatic changes. No focal consolidation, sizeable effusion, or pneumothorax. Lower cervical fixation hardware. Bilateral breast prosthesis. No acute osseous abnormality. IMPRESSION: Emphysema.  Similar bibasilar atelectatic changes. Electronically Signed   By: Hart Robinsons M.D.   On: 12/02/2023 16:11   CT ABDOMEN PELVIS W CONTRAST Result Date: 11/30/2023 CLINICAL DATA:  Abdominal pain, acute, nonlocalized EXAM: CT ABDOMEN AND PELVIS WITH CONTRAST TECHNIQUE: Multidetector CT imaging of the abdomen and pelvis was performed using the standard protocol following bolus administration of intravenous contrast. RADIATION DOSE REDUCTION: This exam was performed according to the departmental dose-optimization program which includes automated exposure control, adjustment of the mA and/or kV according to patient size and/or use of iterative reconstruction technique. CONTRAST:  75mL OMNIPAQUE IOHEXOL 350 MG/ML SOLN COMPARISON:  11/15/2023 FINDINGS: Lower chest: Cardiomegaly, small pericardial effusion. No pleural effusions. Left base atelectasis. Hepatobiliary: No focal hepatic abnormality. Gallbladder unremarkable. Pancreas: No focal abnormality or ductal dilatation. Spleen: No focal abnormality.   Normal size. Adrenals/Urinary Tract:  Area of decreased perfusion in the mid to lower pole of the right kidney is similar to prior study concerning for pyelonephritis. Left kidney enhances normally. No stones or hydronephrosis. Adrenal glands and urinary bladder unremarkable. Stomach/Bowel: Large stool burden throughout the colon. Stomach, large and small bowel grossly unremarkable. Vascular/Lymphatic: Aortic atherosclerosis. No evidence of aneurysm or adenopathy. Reproductive: Prior hysterectomy.  No adnexal masses. Other: No free fluid or free air. Musculoskeletal: No acute bony abnormality. IMPRESSION: Continued area of decreased perfusion in the mid to lower pole of the right kidney compatible with pyelonephritis. This is unchanged since recent study. Coronary artery disease, aortic atherosclerosis. Cardiomegaly. Large stool burden throughout the colon. Electronically Signed   By: Charlett Nose M.D.   On: 11/30/2023 22:20   CT Angio Chest PE W and/or Wo Contrast Result Date: 11/30/2023 CLINICAL DATA:  Pulmonary embolism (PE) suspected, high prob. Shortness of breath EXAM: CT ANGIOGRAPHY CHEST WITH CONTRAST TECHNIQUE: Multidetector CT imaging of the chest was performed using the standard protocol during bolus administration of intravenous contrast. Multiplanar CT image reconstructions and MIPs were obtained to evaluate the vascular anatomy. RADIATION DOSE REDUCTION: This exam was performed according to the departmental dose-optimization program which includes automated exposure control, adjustment of the mA and/or kV according to patient size and/or use of iterative reconstruction technique. CONTRAST:  75mL OMNIPAQUE IOHEXOL 350 MG/ML SOLN COMPARISON:  None Available. FINDINGS: Cardiovascular: Cardiomegaly. Coronary artery and aortic atherosclerosis. No evidence of aortic aneurysm. No filling defects in the pulmonary arteries to suggest pulmonary emboli. Mediastinum/Nodes: Small pericardial effusion. No  mediastinal, hilar, or axillary adenopathy. Trachea and esophagus are unremarkable. Thyroid unremarkable. Lungs/Pleura: Moderate emphysema. Compressive atelectasis in the left lower lobe. No confluent opacities or effusions. Upper Abdomen: No acute findings Musculoskeletal: Chest wall soft tissues are unremarkable. Bilateral breast implants grossly unremarkable. No acute bony abnormality. Review of the MIP images confirms the above findings. IMPRESSION: No evidence of pulmonary embolus. Cardiomegaly, coronary artery disease. No acute cardiopulmonary disease. Aortic Atherosclerosis (ICD10-I70.0) and Emphysema (ICD10-J43.9). Electronically Signed   By: Charlett Nose M.D.   On: 11/30/2023 22:18   DG Chest 2 View Result Date: 11/30/2023 CLINICAL DATA:  Shortness of breath EXAM: CHEST - 2 VIEW COMPARISON:  X-ray 11/17/2023.  CTA 11/14/2023 FINDINGS: Hyperinflation. Stable cardiopericardial silhouette. Improving left retrocardiac opacity. No pneumothorax or effusion. No edema. Surgical clips seen along the left axillary region and right chest wall. Fixation hardware along the lower cervical spine. Degenerative changes of the spine. Osteopenia. Breast implants identified. IMPRESSION: Hyperinflation with chronic changes. Improving left retrocardiac opacity Electronically Signed   By: Karen Kays M.D.   On: 11/30/2023 19:02   DG Ankle Complete Right Result Date: 11/28/2023 CLINICAL DATA:  Postop. EXAM: RIGHT ANKLE - COMPLETE 3+ VIEW COMPARISON:  10/27/2023 FINDINGS: Lateral plate and multi screw fixation of distal fibular fracture. Decreasing conspicuity of the fracture line consistent with interval healing. Two screws traverse the medial malleolar fracture. Decreasing conspicuity of the fracture line consistent with interval healing. Stable fracture alignment. No periprosthetic lucency. The ankle mortise is preserved. Decreased bone mineralization may be due to disuse. IMPRESSION: ORIF of distal fibular and medial  malleolar fractures with evidence of interval healing. No hardware complication. Electronically Signed   By: Narda Rutherford M.D.   On: 11/28/2023 17:51   DG CHEST PORT 1 VIEW Result Date: 11/17/2023 CLINICAL DATA:  Heart failure. EXAM: PORTABLE CHEST 1 VIEW COMPARISON:  Chest radiographs 11/14/2023 and 12/22/2022; CT chest 11/14/2023 FINDINGS: Cardiac silhouette and mediastinal contours  are within limits. There is a right basilar heterogeneous airspace opacity similar to prior. Small bilateral pleural effusions obscure the costophrenic angles, similar to prior. No pneumothorax. Mild dextrocurvature of the lower thoracic spine. Surgical clips overlie the lateral aspect of the lateral chest walls and the left axilla, related to bilateral 1 mastectomies and left axillary lymph node the sections seen on prior CT. There lucent cystic emphysematous changes again seen. Mild levocurvature of the upper thoracic spine and mild dextrocurvature of the thoracolumbar junction. ACDF hardware overlies the lower cervical spine. IMPRESSION: 1. Right basilar heterogeneous airspace opacity, similar to 11/14/2023 but new from 12/22/2022. This may represent atelectasis or pneumonia. 2. Small bilateral pleural effusions, also similar to 11/14/2023 but new from 12/22/2022. 3. Chronic emphysematous changes. Electronically Signed   By: Neita Garnet M.D.   On: 11/17/2023 16:54   ECHO TEE Result Date: 11/16/2023    TRANSESOPHOGEAL ECHO REPORT   Patient Name:   ALAUNA HAYDEN Date of Exam: 11/16/2023 Medical Rec #:  [id number]         Height:       60.0 in Accession #:    [id number]        Weight:       77.6 lb Date of Birth:  01/04/1951        BSA:          1.248 m Patient Age:    72 years          BP:           111/66 mmHg Patient Gender: F                 HR:           99 bpm. Exam Location:  Inpatient Procedure: Color Doppler, Cardiac Doppler and Transesophageal Echo (Both            Spectral and Color Flow Doppler were utilized  during procedure). Indications:     Cardioversion  History:         Patient has prior history of Echocardiogram examinations, most                  recent 11/15/2023.  Sonographer:     Harriette Bouillon RDCS Referring Phys:  [id number] ZANE ADAMS Diagnosing Phys: Thomasene Ripple DO PROCEDURE: The transesophogeal probe was passed without difficulty through the esophogus of the patient. Sedation performed by different physician. The patient developed no complications during the procedure.  IMPRESSIONS  1. Left ventricular ejection fraction, by estimation, is 55 to 60%. The left ventricle has normal function.  2. Right ventricular systolic function is normal. The right ventricular size is normal.  3. Left atrial size was mildly dilated. No left atrial/left atrial appendage thrombus was detected.  4. The mitral valve is abnormal. Severe mitral valve regurgitation. No evidence of mitral stenosis.  5. The tricuspid valve is abnormal. Tricuspid valve regurgitation is mild to moderate.  6. The aortic valve is tricuspid. Aortic valve regurgitation is trivial. No aortic stenosis is present.  7. There is borderline dilatation of the ascending aorta, measuring 36 mm. FINDINGS  Left Ventricle: Left ventricular ejection fraction, by estimation, is 55 to 60%. The left ventricle has normal function. The left ventricular internal cavity size was small. Right Ventricle: The right ventricular size is normal. No increase in right ventricular wall thickness. Right ventricular systolic function is normal. Left Atrium: Left atrial size was mildly dilated. No left atrial/left atrial appendage thrombus was detected. Right Atrium:  Right atrial size was not well visualized. Pericardium: Trivial pericardial effusion is present. The pericardial effusion is circumferential. Mitral Valve: The mitral valve is abnormal. Severe mitral valve regurgitation. No evidence of mitral valve stenosis. Tricuspid Valve: The tricuspid valve is abnormal. Tricuspid valve  regurgitation is mild to moderate. No evidence of tricuspid stenosis. Aortic Valve: The aortic valve is tricuspid. Aortic valve regurgitation is trivial. No aortic stenosis is present. Pulmonic Valve: The pulmonic valve was not well visualized. Pulmonic valve regurgitation is trivial. No evidence of pulmonic stenosis. Aorta: There is borderline dilatation of the ascending aorta, measuring 36 mm. There is minimal (Grade I) layered plaque involving the aortic root. Pulmonary Artery: The pulmonary artery is of normal size. IAS/Shunts: No atrial level shunt detected by color flow Doppler.   AORTA Ao Asc diam: 3.60 cm Lavona Mound Tobb DO Electronically signed by Thomasene Ripple DO Signature Date/Time: 11/16/2023/6:05:04 PM    Final    EP STUDY Result Date: 11/16/2023 See surgical note for result.  VAS [place] LOWER EXTREMITY VENOUS (DVT) (ONLY [facility]) Result Date: 11/15/2023  Lower Venous DVT Study Patient Name:  TEREKA THORLEY  Date of Exam:   11/14/2023 Medical Rec #: [id number]          Accession #:    [id number] Date of Birth: 1951-06-04         Patient Gender: F Patient Age:   23 years Exam Location:  [facility] Procedure:      VAS [place] LOWER EXTREMITY VENOUS (DVT) Referring Phys: Molly Maduro PATERSON --------------------------------------------------------------------------------  Indications: Edema, and Patient tachycardic on arrival with pulse between 190-220, now post Cardizem rate decreased to 140s-160s..  Risk Factors: Right ORIF of ankle 10/12/23. Limitations: Adult undergarment shadowing in bilateral groins Comparison Study: No prior study on file Performing Technologist: Sherren Kerns RVS  Examination Guidelines: A complete evaluation includes B-mode imaging, spectral Doppler, color Doppler, and power Doppler as needed of all accessible portions of each vessel. Bilateral testing is considered an integral part of a complete examination. Limited examinations for reoccurring indications may be performed as noted.  The reflux portion of the exam is performed with the patient in reverse Trendelenburg.  +---------+---------------+---------+-----------+----------+--------------+ RIGHT    CompressibilityPhasicitySpontaneityPropertiesThrombus Aging +---------+---------------+---------+-----------+----------+--------------+ CFV      Full           Yes      No                                  +---------+---------------+---------+-----------+----------+--------------+ SFJ      Full                                                        +---------+---------------+---------+-----------+----------+--------------+ FV Prox  Full                                                        +---------+---------------+---------+-----------+----------+--------------+ FV Mid   Full                                                        +---------+---------------+---------+-----------+----------+--------------+  FV DistalFull                                                        +---------+---------------+---------+-----------+----------+--------------+ PFV      Full                                                        +---------+---------------+---------+-----------+----------+--------------+ POP      Full           No       Yes                                 +---------+---------------+---------+-----------+----------+--------------+ PTV      Full                                                        +---------+---------------+---------+-----------+----------+--------------+ PERO     Full                                                        +---------+---------------+---------+-----------+----------+--------------+   +----+---------------+---------+-----------+----------+--------------+ LEFTCompressibilityPhasicitySpontaneityPropertiesThrombus Aging +----+---------------+---------+-----------+----------+--------------+ CFV Full           No       Yes                                  +----+---------------+---------+-----------+----------+--------------+ SFJ Full                                                        +----+---------------+---------+-----------+----------+--------------+     Summary: RIGHT: - There is no evidence of deep vein thrombosis in the lower extremity.  - No cystic structure found in the popliteal fossa. interstitial edema noted throughout  LEFT: - No evidence of common femoral vein obstruction.   *See table(s) above for measurements and observations. Electronically signed by Gerarda Fraction on 11/15/2023 at 5:28:30 PM.    Final    ECHOCARDIOGRAM COMPLETE Result Date: 11/15/2023    ECHOCARDIOGRAM REPORT   Patient Name:   BEVA REMUND Date of Exam: 11/15/2023 Medical Rec #:  [id number]         Height:       60.0 in Accession #:    [id number]        Weight:       92.0 lb Date of Birth:  August 29, 1951        BSA:          1.342 m Patient Age:    72 years          BP:  135/93 mmHg Patient Gender: F                 HR:           133 bpm. Exam Location:  Inpatient Procedure: 2D Echo, Color Doppler and Cardiac Doppler (Both Spectral and Color            Flow Doppler were utilized during procedure). Indications:    Atrial fibrillation  History:        Patient has prior history of Echocardiogram examinations.                 Cardiomyopathy, COPD, Arrythmias:Tachycardia; Risk                 Factors:Former Smoker.  Sonographer:    Lamont Snowball Referring Phys: [id number] SARA-MAIZ A THOMAS IMPRESSIONS  1. Left ventricular ejection fraction, by estimation, is 60 to 65%. The left ventricle has normal function. The left ventricle has no regional wall motion abnormalities. Left ventricular diastolic function could not be evaluated.  2. Right ventricular systolic function is normal. The right ventricular size is normal. There is normal pulmonary artery systolic pressure. The estimated right ventricular systolic pressure is 35.8 mmHg.  3. Left atrial size  was severely dilated.  4. Right atrial size was severely dilated.  5. Moderate pericardial effusion. The pericardial effusion is posterior to the left ventricle. There is no evidence of cardiac tamponade.  6. The mitral valve is abnormal. Severe mitral valve regurgitation. No evidence of mitral stenosis.  7. The aortic valve is tricuspid. There is mild calcification of the aortic valve. Aortic valve regurgitation is not visualized. Aortic valve sclerosis/calcification is present, without any evidence of aortic stenosis.  8. Aortic dilatation noted. There is mild dilatation of the ascending aorta, measuring 41 mm.  9. The inferior vena cava is dilated in size with <50% respiratory variability, suggesting right atrial pressure of 15 mmHg. FINDINGS  Left Ventricle: Left ventricular ejection fraction, by estimation, is 60 to 65%. The left ventricle has normal function. The left ventricle has no regional wall motion abnormalities. The left ventricular internal cavity size was normal in size. There is  no left ventricular hypertrophy. Left ventricular diastolic function could not be evaluated due to atrial fibrillation. Left ventricular diastolic function could not be evaluated. Right Ventricle: The right ventricular size is normal. No increase in right ventricular wall thickness. Right ventricular systolic function is normal. There is normal pulmonary artery systolic pressure. The tricuspid regurgitant velocity is 2.28 m/s, and  with an assumed right atrial pressure of 15 mmHg, the estimated right ventricular systolic pressure is 35.8 mmHg. Left Atrium: Left atrial size was severely dilated. Right Atrium: Right atrial size was severely dilated. Pericardium: A moderately sized pericardial effusion is present. The pericardial effusion is posterior to the left ventricle. There is no evidence of cardiac tamponade. Mitral Valve: The mitral valve is abnormal. Severe mitral valve regurgitation, with centrally-directed jet. No  evidence of mitral valve stenosis. MV peak gradient, 10.2 mmHg. The mean mitral valve gradient is 4.3 mmHg. Tricuspid Valve: The tricuspid valve is normal in structure. Tricuspid valve regurgitation is mild . No evidence of tricuspid stenosis. Aortic Valve: The aortic valve is tricuspid. There is mild calcification of the aortic valve. Aortic valve regurgitation is not visualized. Aortic valve sclerosis/calcification is present, without any evidence of aortic stenosis. Aortic valve peak gradient measures 7.8 mmHg. Pulmonic Valve: The pulmonic valve was normal in structure. Pulmonic valve regurgitation is trivial. No evidence of  pulmonic stenosis. Aorta: Aortic dilatation noted. There is mild dilatation of the ascending aorta, measuring 41 mm. Venous: The inferior vena cava is dilated in size with less than 50% respiratory variability, suggesting right atrial pressure of 15 mmHg. IAS/Shunts: No atrial level shunt detected by color flow Doppler.  LEFT VENTRICLE PLAX 2D LVIDd:         4.60 cm LVIDs:         3.00 cm LV PW:         1.30 cm LV IVS:        0.60 cm LVOT diam:     1.90 cm LV SV:         50 LV SV Index:   37 LVOT Area:     2.84 cm  RIGHT VENTRICLE          IVC RV Basal diam:  3.10 cm  IVC diam: 2.10 cm LEFT ATRIUM             Index        RIGHT ATRIUM           Index LA diam:        4.50 cm 3.35 cm/m   RA Area:     20.90 cm LA Vol (A2C):   63.4 ml 47.25 ml/m  RA Volume:   51.50 ml  38.38 ml/m LA Vol (A4C):   57.9 ml 43.15 ml/m LA Biplane Vol: 64.8 ml 48.29 ml/m  AORTIC VALVE AV Area (Vmax): 2.54 cm AV Vmax:        139.67 cm/s AV Peak Grad:   7.8 mmHg LVOT Vmax:      125.33 cm/s LVOT Vmean:     79.300 cm/s LVOT VTI:       0.175 m  AORTA Ao Root diam: 3.70 cm Ao Asc diam:  4.10 cm MITRAL VALVE                 TRICUSPID VALVE MV Area (PHT): 4.79 cm      TR Peak grad:   20.8 mmHg MV Area VTI:   2.55 cm      TR Vmax:        228.00 cm/s MV Peak grad:  10.2 mmHg MV Mean grad:  4.3 mmHg      SHUNTS MV Vmax:        1.59 m/s      Systemic VTI:  0.17 m MV Vmean:      94.9 cm/s     Systemic Diam: 1.90 cm MV Decel Time: 158 msec MR Peak grad:   115.3 mmHg MR Mean grad:   86.0 mmHg MR Vmax:        537.00 cm/s MR Vmean:       445.5 cm/s MR PISA:        4.02 cm MR PISA Radius: 0.80 cm MV E velocity: 149.67 cm/s Arvilla Meres MD Electronically signed by Arvilla Meres MD Signature Date/Time: 11/15/2023/11:11:39 AM    Final    CT ABDOMEN PELVIS W CONTRAST Result Date: 11/15/2023 CLINICAL DATA:  Acute abdominal pain EXAM: CT ABDOMEN AND PELVIS WITH CONTRAST TECHNIQUE: Multidetector CT imaging of the abdomen and pelvis was performed using the standard protocol following bolus administration of intravenous contrast. RADIATION DOSE REDUCTION: This exam was performed according to the departmental dose-optimization program which includes automated exposure control, adjustment of the mA and/or kV according to patient size and/or use of iterative reconstruction technique. CONTRAST:  50mL OMNIPAQUE IOHEXOL 350 MG/ML SOLN COMPARISON:  None Available.  FINDINGS: Lower Chest: Small pleural effusions with bibasilar atelectasis. Hepatobiliary: Normal hepatic contours. No intra- or extrahepatic biliary dilatation. Status post cholecystectomy. Pancreas: Normal pancreas. No ductal dilatation or peripancreatic fluid collection. Spleen: Normal. Adrenals/Urinary Tract: Poor visualization of the adrenal glands. Heterogeneous contrast enhancement of the interpolar right kidney. Normal left kidney. The urinary bladder is normal for degree of distention Stomach/Bowel: There is no hiatal hernia. Normal duodenal course and caliber. No small bowel dilatation or inflammation. No focal colonic abnormality. Normal appendix. Vascular/Lymphatic: There is calcific atherosclerosis of the abdominal aorta. No lymphadenopathy. Reproductive: Normal uterus. No adnexal mass. Other: Small volume left upper quadrant free fluid. Musculoskeletal: Dextroscoliosis  with apex at L2. IMPRESSION: 1. Heterogeneous contrast enhancement of the interpolar right kidney, concerning for pyelonephritis. Correlate with urinalysis. 2. Small pleural effusions with bibasilar atelectasis. 3. Small amount of free fluid in the left upper quadrant. Electronically Signed   By: Deatra Robinson M.D.   On: 11/15/2023 02:20   CT Angio Chest PE W and/or Wo Contrast Result Date: 11/14/2023 CLINICAL DATA:  Shortness of breath.  PE suspected EXAM: CT ANGIOGRAPHY CHEST WITH CONTRAST TECHNIQUE: Multidetector CT imaging of the chest was performed using the standard protocol during bolus administration of intravenous contrast. Multiplanar CT image reconstructions and MIPs were obtained to evaluate the vascular anatomy. RADIATION DOSE REDUCTION: This exam was performed according to the departmental dose-optimization program which includes automated exposure control, adjustment of the mA and/or kV according to patient size and/or use of iterative reconstruction technique. CONTRAST:  50mL OMNIPAQUE IOHEXOL 350 MG/ML SOLN COMPARISON:  Same day chest radiograph and PET/CT 12/27/1998 FINDINGS: Cardiovascular: Cardiomegaly. Small pericardial effusion. Negative for acute pulmonary embolism. Aortic and coronary artery atherosclerotic calcification. Mediastinum/Nodes: Trachea and esophagus are unremarkable. Shotty mediastinal lymph nodes are likely reactive. Lungs/Pleura: Respiratory motion obscures detail. Emphysema. Small right-greater-than-left pleural effusions and compressive atelectasis. Mild diffuse bronchial wall thickening. Question interlobular septal thickening though evaluation is limited by motion artifact. No pneumothorax. Upper Abdomen: Low-density free fluid in the left upper quadrant. Musculoskeletal: No acute fracture. Postoperative changes both breasts. Review of the MIP images confirms the above findings. IMPRESSION: 1. Negative for acute pulmonary embolism. 2. Question interstitial pulmonary  edema. Small right-greater-than-left pleural effusions and compressive atelectasis. 3. Cardiomegaly with small pericardial effusion. 4. Low-density free fluid in the left upper quadrant. CT abdomen pelvis with IV contrast is recommended for further evaluation. Aortic Atherosclerosis (ICD10-I70.0) and Emphysema (ICD10-J43.9). Electronically Signed   By: Minerva Fester M.D.   On: 11/14/2023 19:08   DG Chest Portable 1 View Result Date: 11/14/2023 CLINICAL DATA:  Short of breath. EXAM: PORTABLE CHEST 1 VIEW COMPARISON:  12/22/2022. FINDINGS: Bilateral lung base opacities obscure the hemidiaphragms. Mid and upper lungs are clear. Cardiac silhouette is normal in size. No mediastinal or hilar masses. No pneumothorax.  Suspect small bilateral effusions. Stable changes from prior breast surgery. Skeletal structures are grossly intact. IMPRESSION: 1. Bilateral lung base opacities new since the prior chest radiographs, suspected to be a combination of atelectasis/pneumonia and small bilateral effusions. No evidence of pulmonary edema. Electronically Signed   By: Amie Portland M.D.   On: 11/14/2023 13:07    PERFORMANCE STATUS (ECOG) : 3 - Symptomatic, >50% confined to bed  Review of Systems Unable to complete  Physical Exam General: Thin, frail appearing, chronically ill-appearing Cardiovascular: Irregular Pulmonary: Labored, pursed lip breathing Extremities: no edema, no joint deformities Skin: no rashes Neurological: Weakness but otherwise nonfocal  IMPRESSION: Patient has several recent hospitalizations with complications secondary  to acute on chronic diastolic heart failure and severe MR.  She was pending outpatient evaluation for possible MitraClip.  She is now readmitted with CHF.  Initially, she was quite hypotensive, so she was started on midodrine and albumin.  Today, BP is more stable and she is being started on diuretics.  Unfortunately, patient's O2 requirements have increased overnight from 3  L to 5 L.  Patient is currently quite symptomatic.  She is alert but becomes dyspneic when speaking.  She has pursed lip breathing.  Patient was able to answer questions with 1-2 word answers.  Patient does relay agreement with current scope of treatment.  Discussed CODE STATUS with her and patient indicated that she wished to remain a full code for now but would not want her life prolonged long-term on machines.  I called and had a long conversation with patient's daughter by phone.  Daughter describes a pattern of rapid decline over the last several months.  Patient was not functionally improving at rehab and has continued to have poor oral intake and weight loss.  Patient was mostly bed/chair bound.  Daughter refers to herself as a "realist" who understands that patient is unlikely to return to her previous level of independence. Daughter says she also recognizes that patient may further decline until end-of-life.  Daughter is also in agreement with current scope of treatment and would like to keep patient hospitalized until interventional cardiology can evaluate next week.  We did discuss the possible future option of de-escalation of care as daughter does not feel that patient's current health correlates with a good quality of life.  Daughter states that she would support DNR/DNI and plans to speak with patient about this when she comes to the hospital tomorrow.  PLAN: -Continue current scope of treatment -Daughter plans to speak with patient more about code status -Daughter awaiting cardiology input next week -Can consider adding low dose morphine for dyspnea -Will follow  Case and plan discussed with Dr. Marland Mcalpine    Time Total: 60 minutes  Visit consisted of counseling and education dealing with the complex and emotionally intense issues of symptom management and palliative care in the setting of serious and potentially life-threatening illness.Greater than 50%  of this time was spent  counseling and coordinating care related to the above assessment and plan.  Signed by: Laurette Schimke, PhD, NP-C

## 2023-12-04 NOTE — Progress Notes (Signed)
   12/04/23 1047  Assess: MEWS Score  Pulse Rate 79  ECG Heart Rate 80  Resp (!) 29  SpO2 95 %  O2 Device Nasal Cannula  O2 Flow Rate (L/min) 7 L/min  Assess: MEWS Score  MEWS Temp 0  MEWS Systolic 0  MEWS Pulse 0  MEWS RR 2  MEWS LOC 0  MEWS Score 2  MEWS Score Color Yellow  Assess: SIRS CRITERIA  SIRS Temperature  0  SIRS Respirations  1  SIRS Pulse 0  SIRS WBC 1  SIRS Score Sum  2

## 2023-12-04 NOTE — Plan of Care (Signed)
  Problem: Education: Goal: Knowledge of General Education information will improve Description: Including pain rating scale, medication(s)/side effects and non-pharmacologic comfort measures Outcome: Progressing   Problem: Clinical Measurements: Goal: Ability to maintain clinical measurements within normal limits will improve Outcome: Progressing Goal: Will remain free from infection Outcome: Progressing Goal: Cardiovascular complication will be avoided Outcome: Progressing   Problem: Nutrition: Goal: Adequate nutrition will be maintained Outcome: Progressing   Problem: Elimination: Goal: Will not experience complications related to bowel motility Outcome: Progressing Goal: Will not experience complications related to urinary retention Outcome: Progressing   Problem: Safety: Goal: Ability to remain free from injury will improve Outcome: Progressing   Problem: Skin Integrity: Goal: Risk for impaired skin integrity will decrease Outcome: Progressing   Problem: Clinical Measurements: Goal: Respiratory complications will improve Outcome: Not Progressing   Problem: Activity: Goal: Risk for activity intolerance will decrease Outcome: Not Progressing   Problem: Coping: Goal: Level of anxiety will decrease Outcome: Not Progressing

## 2023-12-04 NOTE — Progress Notes (Signed)
   12/04/23 1044  Assess: MEWS Score  Pulse Rate 80  ECG Heart Rate 81  Resp (!) 27  SpO2 (!) 89 %  O2 Device Nasal Cannula  O2 Flow Rate (L/min) 5 L/min  Assess: MEWS Score  MEWS Temp 0  MEWS Systolic 0  MEWS Pulse 0  MEWS RR 2  MEWS LOC 0  MEWS Score 2  MEWS Score Color Yellow  Assess: SIRS CRITERIA  SIRS Temperature  0  SIRS Respirations  1  SIRS Pulse 0  SIRS WBC 1  SIRS Score Sum  2

## 2023-12-04 NOTE — Progress Notes (Signed)
   12/04/23 1031  Assess: MEWS Score  Pulse Rate 81  ECG Heart Rate 81  Resp (!) 26  SpO2 93 %  O2 Device Nasal Cannula  O2 Flow Rate (L/min) 5 L/min  Assess: MEWS Score  MEWS Temp 0  MEWS Systolic 0  MEWS Pulse 0  MEWS RR 2  MEWS LOC 0  MEWS Score 2  MEWS Score Color Yellow  Assess: if the MEWS score is Yellow or Red  Were vital signs accurate and taken at a resting state? Yes  Notify: Charge Nurse/RN  Name of Charge Nurse/RN Notified Jen  Assess: SIRS CRITERIA  SIRS Temperature  0  SIRS Respirations  1  SIRS Pulse 0  SIRS WBC 1  SIRS Score Sum  2

## 2023-12-04 NOTE — NC FL2 (Signed)
 [place] MEDICAID FL2 LEVEL OF CARE FORM     IDENTIFICATION  Patient Name: [name] Birthdate: Dec 19, 1950 Sex: female Admission Date (Current Location): 12/02/2023  [facility] and [place] Number:  Producer, television/film/video and Address:  [facility] [place], [address], [place], [place] [address]      Provider Number: [id number]  Attending Physician Name and Address:  Merlene Laughter, DO  Relative Name and Phone Number:       Current Level of Care: Hospital Recommended Level of Care: Skilled Nursing Facility Prior Approval Number:    Date Approved/Denied:   PASRR Number: [id number]  Discharge Plan: SNF    Current Diagnoses: Patient Active Problem List   Diagnosis Date Noted   Palliative care encounter 12/04/2023   Acute on chronic heart failure with preserved ejection fraction (HFpEF) (HCC) 12/02/2023   Paroxysmal atrial fibrillation (HCC) 12/02/2023   Severe mitral regurgitation 12/02/2023   Pericardial effusion 12/02/2023   Acute on chronic diastolic CHF (congestive heart failure) (HCC) 12/02/2023   Closed bimalleolar fracture of right ankle 10/12/2023   Closed bimalleolar fracture of right ankle, initial encounter 10/12/2023   Atrial tachycardia (HCC) 06/05/2023   Chronic respiratory failure with hypoxia (HCC) 02/19/2023   Protein-calorie malnutrition, severe (HCC) 12/24/2022   Tachyarrhythmia 12/22/2022   Hypokalemia 12/22/2022   Estrogen deficiency 06/28/2019   Osteopenia after menopause 11/06/2017   MDD (major depressive disorder), recurrent episode, moderate (HCC) 09/22/2017   Suicide attempt (HCC)    B12 deficiency 08/03/2017   Other insomnia 11/23/2016   DDD (degenerative disc disease), cervical 11/23/2016   DDD (degenerative disc disease), lumbar 11/23/2016   Other idiopathic scoliosis, lumbar region 11/23/2016   COPD mixed type (HCC) 11/20/2013   Vitamin D deficiency 07/06/2011   CARDIOMYOPATHY, PRIMARY, DILATED 01/01/2009    Malignant tumor of breast (HCC) 07/31/2008   Mixed hyperlipidemia 10/18/2007   Esophagitis 10/18/2007   GAD (generalized anxiety disorder) 10/12/2007   IBS 10/12/2007   Primary osteoarthritis of both hands 10/12/2007   Osteoporosis 10/12/2007   History of colonic polyps 10/12/2007   MIGRAINES, HX OF 10/12/2007   Fibromyalgia--Dr. Victory Dakin, on Ultram 07/14/2007    Orientation RESPIRATION BLADDER Height & Weight     Self, Time, Situation, Place  O2 Continent Weight: 83 lb 12.4 oz (38 kg) Height:  5' (152.4 cm)  BEHAVIORAL SYMPTOMS/MOOD NEUROLOGICAL BOWEL NUTRITION STATUS      Continent Diet (See dc summary)  AMBULATORY STATUS COMMUNICATION OF NEEDS Skin   Extensive Assist Verbally Normal                       Personal Care Assistance Level of Assistance  Bathing, Feeding, Dressing, Total care Bathing Assistance: Maximum assistance Feeding assistance: Limited assistance Dressing Assistance: Maximum assistance Total Care Assistance: Limited assistance   Functional Limitations Info  Sight, Hearing, Speech Sight Info: Impaired Hearing Info: Adequate Speech Info: Adequate    SPECIAL CARE FACTORS FREQUENCY  PT (By licensed PT), OT (By licensed OT)     PT Frequency: 5x week OT Frequency: 5x week            Contractures      Additional Factors Info  Code Status, Allergies, Psychotropic Code Status Info: Full Allergies Info: Amoxicillin-pot Clavulanate; Doxycycline; Venlafaxine Psychotropic Info: citalopram (CELEXA) tablet 20 mg         Current Medications (12/04/2023):  This is the current hospital active medication list Current Facility-Administered Medications  Medication Dose Route Frequency Provider  Last Rate Last Admin   acetaminophen (TYLENOL) tablet 650 mg  650 mg Oral Q6H PRN Dow Adolph N, DO       albuterol (PROVENTIL) (2.5 MG/3ML) 0.083% nebulizer solution 3 mL  3 mL Inhalation Q6H PRN Marguerita Merles [place], DO   3 mL at 12/04/23 0217   apixaban  (ELIQUIS) tablet 5 mg  5 mg Oral BID Dow Adolph N, DO   5 mg at 12/04/23 0910   arformoterol (BROVANA) nebulizer solution 15 mcg  15 mcg Nebulization BID Marguerita Merles [place], DO   15 mcg at 12/04/23 1123   atorvastatin (LIPITOR) tablet 40 mg  40 mg Oral Daily Darlin Drop, DO   40 mg at 12/04/23 0910   budesonide (PULMICORT) nebulizer solution 0.25 mg  0.25 mg Nebulization BID Sheikh, Omair Latif, DO   0.25 mg at 12/04/23 1123   citalopram (CELEXA) tablet 20 mg  20 mg Oral Daily Dow Adolph N, DO   20 mg at 12/04/23 1610   furosemide (LASIX) injection 20 mg  20 mg Intravenous Q12H Sheikh, Kateri [place], DO   20 mg at 12/04/23 0911   ipratropium (ATROVENT) nebulizer solution 0.5 mg  0.5 mg Nebulization Q6H Sheikh, Omair Latif, DO       levalbuterol [facility]) nebulizer solution 0.63 mg  0.63 mg Nebulization Q6H Sheikh, Omair Latif, DO       melatonin tablet 5 mg  5 mg Oral QHS PRN Dow Adolph N, DO   5 mg at 12/03/23 2359   methylPREDNISolone sodium succinate (SOLU-MEDROL) 125 mg/2 mL injection 80 mg  80 mg Intravenous Q24H Sheikh, Omair [place], DO   80 mg at 12/04/23 1119   metoprolol tartrate (LOPRESSOR) injection 5 mg  5 mg Intravenous Q2H PRN Opyd, Lavone Neri, MD   5 mg at 12/04/23 0410   midodrine (PROAMATINE) tablet 10 mg  10 mg Oral TID WC Sheikh, Omair Latif, DO   10 mg at 12/04/23 1120   polyethylene glycol (MIRALAX / GLYCOLAX) packet 17 g  17 g Oral Daily PRN Dow Adolph N, DO       potassium chloride SA (KLOR-CON M) CR tablet 40 mEq  40 mEq Oral BID Marguerita Merles [place], DO   40 mEq at 12/04/23 9604   prochlorperazine (COMPAZINE) injection 5 mg  5 mg Intravenous Q6H PRN Dow Adolph N, DO   5 mg at 12/04/23 0213     Discharge Medications: Please see discharge summary for a list of discharge medications.  Relevant Imaging Results:  Relevant Lab Results:   Additional Information SSN: [id number]  Reva Bores, LCSWA

## 2023-12-04 NOTE — TOC Initial Note (Addendum)
 Transition of Care [facility]) - Initial/Assessment Note    Patient Details  Name: [name] MRN: [id number] Date of Birth: 12-19-1950  Transition of Care [facility]) CM/SW Contact:    Nicanor Bake Phone Number: [phone] 12/04/2023, 10:18 AM  Clinical Narrative: CSW met with patient at bedside. Patient stated that she is from [facility]. Patient sated that she is willing to return back. Patient stated that she has no history of HH services. Patient stated that she uses a wheelchair and 02 at home. Patient stated that she still drives to and from appointments. Patient stated that she does have a PCP. CSW inquired about the patients SDOH needs. Patient stated that she does not have any needs and declined resources.   CSW spoke with Star from [facility] who stated that the patient can come back to their facility she will need new auth.  FL2 completed and faxed out- offers pending.   TOC will continue following.             Expected Discharge Plan: Skilled Nursing Facility [facility]) Barriers to Discharge: Continued Medical Work up   Patient Goals and CMS Choice            Expected Discharge Plan and Services       Living arrangements for the past 2 months: Single Family Home                                      Prior Living Arrangements/Services Living arrangements for the past 2 months: Single Family Home Lives with:: Self Patient language and need for interpreter reviewed:: Yes Do you feel safe going back to the place where you live?: Yes      Need for Family Participation in Patient Care: Yes (Comment) Care giver support system in place?: No (comment)   Criminal Activity/Legal Involvement Pertinent to Current Situation/Hospitalization: No - Comment as needed  Activities of Daily Living   ADL Screening (condition at time of admission) Independently performs ADLs?: No Does the patient have a NEW difficulty with  bathing/dressing/toileting/self-feeding that is expected to last >3 days?: No Does the patient have a NEW difficulty with getting in/out of bed, walking, or climbing stairs that is expected to last >3 days?: Yes (Initiates electronic notice to provider for possible PT consult) Does the patient have a NEW difficulty with communication that is expected to last >3 days?: No Is the patient deaf or have difficulty hearing?: Yes Does the patient have difficulty seeing, even when wearing glasses/contacts?: Yes Does the patient have difficulty concentrating, remembering, or making decisions?: No  Permission Sought/Granted                  Emotional Assessment Appearance:: Appears stated age Attitude/Demeanor/Rapport: Engaged Affect (typically observed): Appropriate Orientation: : Oriented to Self, Oriented to Place, Oriented to  Time, Oriented to Situation Alcohol / Substance Use: Not Applicable Psych Involvement: No (comment)  Admission diagnosis:  Acute on chronic diastolic CHF (congestive heart failure) (HCC) [I50.33] Acute on chronic congestive heart failure, unspecified heart failure type [facility]) [I50.9] Patient Active Problem List   Diagnosis Date Noted   Acute on chronic heart failure with preserved ejection fraction (HFpEF) (HCC) 12/02/2023   Paroxysmal atrial fibrillation (HCC) 12/02/2023   Severe mitral regurgitation 12/02/2023   Pericardial effusion 12/02/2023   Acute on chronic diastolic CHF (congestive heart failure) (HCC) 12/02/2023   Closed bimalleolar fracture  of right ankle 10/12/2023   Closed bimalleolar fracture of right ankle, initial encounter 10/12/2023   Atrial tachycardia (HCC) 06/05/2023   Chronic respiratory failure with hypoxia (HCC) 02/19/2023   Protein-calorie malnutrition, severe (HCC) 12/24/2022   Tachyarrhythmia 12/22/2022   Hypokalemia 12/22/2022   Estrogen deficiency 06/28/2019   Osteopenia after menopause 11/06/2017   MDD (major depressive disorder),  recurrent episode, moderate (HCC) 09/22/2017   Suicide attempt (HCC)    B12 deficiency 08/03/2017   Other insomnia 11/23/2016   DDD (degenerative disc disease), cervical 11/23/2016   DDD (degenerative disc disease), lumbar 11/23/2016   Other idiopathic scoliosis, lumbar region 11/23/2016   COPD mixed type (HCC) 11/20/2013   Vitamin D deficiency 07/06/2011   CARDIOMYOPATHY, PRIMARY, DILATED 01/01/2009   Malignant tumor of breast (HCC) 07/31/2008   Mixed hyperlipidemia 10/18/2007   Esophagitis 10/18/2007   GAD (generalized anxiety disorder) 10/12/2007   IBS 10/12/2007   Primary osteoarthritis of both hands 10/12/2007   Osteoporosis 10/12/2007   History of colonic polyps 10/12/2007   MIGRAINES, HX OF 10/12/2007   Fibromyalgia--Dr. Victory Dakin, on Ultram 07/14/2007   PCP:  Melida Quitter, PA Pharmacy:   [id number] - [place], [place] - [address] [place] [address] Phone: [phone] Fax: [phone]  [facility] [address] [place] [address] Phone: [phone] Fax: [phone]     Social Drivers of Health (SDOH) Social History: SDOH Screenings   Food Insecurity: Food Insecurity Present (12/03/2023)  Housing: Low Risk  (12/03/2023)  Recent Concern: Housing - High Risk (11/14/2023)  Transportation Needs: No Transportation Needs (12/03/2023)  Utilities: Not At Risk (12/03/2023)  Alcohol Screen: Low Risk  (11/17/2023)  Depression (PHQ2-9): Medium Risk (09/16/2023)  Financial Resource Strain: Low Risk  (11/17/2023)  Physical Activity: Inactive (05/11/2023)  Social Connections: Socially Isolated (12/03/2023)  Stress: No Stress Concern Present (05/11/2023)  Tobacco Use: Medium Risk (11/30/2023)  Health Literacy: Adequate Health Literacy (05/11/2023)   SDOH Interventions:     Readmission Risk Interventions     No data to display

## 2023-12-04 NOTE — Progress Notes (Signed)
 PROGRESS NOTE    [name]  ZOX:[id number] DOB: June 21, 1951 DOA: 12/02/2023 PCP: Melida Quitter, PA   Brief Narrative:  [name] is a 73 y.o. female with medical history significant for COPD on 3 L nasal cannula continuously, nonischemic cardiomyopathy, hyperlipidemia, GERD, recent bimalleolar ankle fracture after fall, paroxysmal A-fib/A-flutter on Eliquis, history of severe MR, moderate pericardial effusion, chronic HFrEF, who presents to the ER via EMS from SNF due to worsening shortness of breath.  Associated with generalized weakness and intermittent palpitations.  No reported chest pain.   In the ER, cardiomegaly with concern for edema on chest x-ray.  BNP elevated greater than 700.  Bilateral lower extremity edema.  The patient received IV Lasix in the ER.  EDP discussed the case with cardiology who recommended medicine admission for further diuresis and management of acute on chronic HFpEF.  Admitted by [facility], hospitalist service.   **Interim History Diuresis was held given her low blood pressure so she is been initiated on midodrine and will attempt diuresis if BP improves.  PT OT recommended SNF.  Cardiology following and will get the structural heart team to evaluate.  Assessment and Plan:  Acute on Chronic Diastolic HFpEF / Sever MR : Presented with BNP of 746.7, lower extremity edema, progressive dyspnea. IV diuresing initiated in the ER but held now due to BP. Seen by Cardiology and getting IV Albumin and Midodrine. Start strict I's and O's and daily weight. Recent echo done on 11/16/2023 revealed LVEF 60 to 65% and no regional wall motion abnormalities.  Will increase diuretics today if able but currently being held.  Per cardiology she is a procedural candidate for MTEER with posterior restriction and mild aortic dilatation we will due to off axis images.  They are recommending structural heart team to evaluate and considering palliative care consultation for goals of  care discussion. PT/OT recommending SNF   Hypotension: Hold Antihypertensives. Received a dose of midodrine 10 mg x 1 so will resume 10 mg TID. Given IV Albumin 12.5 grams x1 Maintain MAP greater than 65. Closely monitor vital signs while on diuretics (held for now).    Persistent A-fib/A-flutter on Eliquis: Resume home Apixaban 5 mg po BID; Rate is controlled. CTM on Telemetry. Holding Antihypertensives and beta-blocking and AV nodal blocking agents given Soft BP  Dysphagia: Having trouble swallowing pills per report. SLP evaluated and now recommending Regular Diet with Thin Liquids.   Hypokalemia: K+ is now 3.4. Replete with po KCL 40 mEQ BID. CTM and Replete as Necessary. Repeat CMP in the AM   Hyperlipidemia: C/w Atorvastatin 40 mg po Daily   Chronic Anxiety/Depression: C/w Citalopram 20 mg po Dally    COPD on 3 L: Resume home regimen Umeclidinium Bromide 62.5 mcg/act 1 puff IH Daily; C/w Albuterol 3 mL IH q6hprn Wheezing and SOB. Maintain O2 saturation above 90%. SpO2: 95 % O2 Flow Rate (L/min): 3 L/min. Repeat CXR in the AM  Normocytic Anemia: Hgb/Hct went from 9.0/28.6 -> 8.2/26.6. Check Anemia Panel in the AM. CTM for S/Sx of Bleeding; No overt bleeding noted. Repeat CBC in the AM   Generalized Weakness and Deconditioning: PT/OT recommending SNF. Fall precautions  Hypoalbuminemia: Patient's Albumin Level went from 3.7 -> 3.0. CTM and Trend and repeat CMP in the AM  Severe Protein Calorie Malnutrition/ Underweight and failure to thrive: Nutrition Status- Nutrition Problem: Severe Malnutrition Etiology: chronic illness (COPD, HFpEF) Signs/Symptoms: moderate muscle depletion, severe fat depletion, severe muscle depletion Interventions: Magic cup, Liberalize Diet,  Refer to RD note for recommendations Estimated body mass index is 17.22 kg/m as calculated from the following:   Height as of this encounter: 5' (1.524 m).   Weight as of this encounter: 40 kg.   DVT prophylaxis:   apixaban (ELIQUIS) tablet 5 mg    Code Status: Full Code Family Communication: No family present at bedside  Disposition Plan:  Level of care: Progressive Status is: Inpatient Remains inpatient appropriate because: Clinical improvement and clearance by specialists and will continue further goals of care discussion given the there is concern of her decompensating   Consultants:  Cardiology Pulmonary  Procedures:  As delineated as above  Antimicrobials:  Anti-infectives (From admission, onward)    None       Subjective: Seen and examined at bedside she is feeling worse today compared to yesterday and had a rough night.  Was more short of breath and fatigued and very dyspneic.  No nausea or vomiting.  No other concerns or complaints at this time.  Objective: Vitals:   12/04/23 1047 12/04/23 1245 12/04/23 1404 12/04/23 1420  BP:  113/67    Pulse: 79 83 84 88  Resp: (!) 29 (!) 33 (!) 27 (!) 22  Temp:  97.7 F (36.5 C)    TempSrc:  Oral    SpO2: 95% 98% 92% 94%  Weight:      Height:        Intake/Output Summary (Last 24 hours) at 12/04/2023 1459 Last data filed at 12/04/2023 1249 Gross per 24 hour  Intake 150 ml  Output 50 ml  Net 100 ml   Filed Weights   12/02/23 1355 12/03/23 0322 12/04/23 0530  Weight: 38.4 kg 40 kg 38 kg   Examination: Physical Exam:  Constitutional: Thin elderly Caucasian female who appears uncomfortable and in some respiratory distress Respiratory: Diminished to auscultation bilaterally, no wheezing, rales, rhonchi or crackles. Normal respiratory effort and patient is not tachypenic. No accessory muscle use.  Wearing supplemental oxygen via nasal cannula and O2 requirements are trending upwards Cardiovascular: RRR, systolic murmur noted.  Has some 1+ lower extremity edema Abdomen: Soft, non-tender, non-distended. Bowel sounds positive.  GU: Deferred. Musculoskeletal: No clubbing / cyanosis of digits/nails. No joint deformity upper and lower  extremities.  Skin: No rashes, lesions, ulcers on a limited skin evaluation. No induration; Warm and dry.  Neurologic: CN 2-12 grossly intact with no focal deficits. Romberg sign and cerebellar reflexes not assessed.  Psychiatric: Normal judgment and insight. Alert and oriented x 3. Anxious mood.  Data Reviewed: I have personally reviewed following labs and imaging studies  CBC: Recent Labs  Lab 11/30/23 1722 12/02/23 1449 12/03/23 1037 12/04/23 0309  WBC 12.9* 10.7* 8.2 12.1*  NEUTROABS 10.3*  --   --  10.3*  HGB 10.4* 9.0* 8.2* 8.6*  HCT 32.6* 28.6* 26.6* 27.6*  MCV 89.3 89.7 89.6 90.5  PLT 448* 357 397 395   Basic Metabolic Panel: Recent Labs  Lab 11/30/23 1722 12/02/23 1449 12/03/23 1037 12/04/23 0309  NA 138 138 139 140  K 3.1* 3.9 3.4* 3.5  CL 94* 95* 95* 96*  CO2 32 31 33* 31  GLUCOSE 99 132* 88 279*  BUN 21 23 24* 23  CREATININE 0.83 0.75 0.85 0.90  CALCIUM 9.2 9.4 9.1 9.2  MG 1.8  --  2.0 2.0  PHOS  --   --  2.5 4.6   GFR: Estimated Creatinine Clearance: 33.4 mL/min (by C-G formula based on SCr of 0.9 mg/dL). Liver Function  Tests: Recent Labs  Lab 11/30/23 1722 12/02/23 1449 12/04/23 0309  AST 23 18 29   ALT 14 12 18   ALKPHOS 68 49 56  BILITOT 0.6 0.3 0.4  PROT 7.4 6.6 6.5  ALBUMIN 3.7 3.0* 3.3*   Recent Labs  Lab 11/30/23 1722  LIPASE 40   No results for input(s): "AMMONIA" in the last 168 hours. Coagulation Profile: No results for input(s): "INR", "PROTIME" in the last 168 hours. Cardiac Enzymes: No results for input(s): "CKTOTAL", "CKMB", "CKMBINDEX", "TROPONINI" in the last 168 hours. BNP (last 3 results) No results for input(s): "PROBNP" in the last 8760 hours. HbA1C: No results for input(s): "HGBA1C" in the last 72 hours. CBG: No results for input(s): "GLUCAP" in the last 168 hours. Lipid Profile: No results for input(s): "CHOL", "HDL", "LDLCALC", "TRIG", "CHOLHDL", "LDLDIRECT" in the last 72 hours. Thyroid Function Tests: No  results for input(s): "TSH", "T4TOTAL", "FREET4", "T3FREE", "THYROIDAB" in the last 72 hours. Anemia Panel: No results for input(s): "VITAMINB12", "FOLATE", "FERRITIN", "TIBC", "IRON", "RETICCTPCT" in the last 72 hours. Sepsis Labs: No results for input(s): "PROCALCITON", "LATICACIDVEN" in the last 168 hours.  Recent Results (from the past 240 hours)  Resp panel by RT-PCR (RSV, Flu A&B, Covid) Anterior Nasal Swab     Status: None   Collection Time: 12/02/23  2:38 PM   Specimen: Anterior Nasal Swab  Result Value Ref Range Status   SARS Coronavirus 2 by RT PCR NEGATIVE NEGATIVE Final   Influenza A by PCR NEGATIVE NEGATIVE Final   Influenza B by PCR NEGATIVE NEGATIVE Final    Comment: (NOTE) The Xpert Xpress SARS-CoV-2/FLU/RSV plus assay is intended as an aid in the diagnosis of influenza from Nasopharyngeal swab specimens and should not be used as a sole basis for treatment. Nasal washings and aspirates are unacceptable for Xpert Xpress SARS-CoV-2/FLU/RSV testing.  Fact Sheet for Patients: BloggerCourse.com  Fact Sheet for Healthcare Providers: SeriousBroker.it  This test is not yet approved or cleared by the [place] FDA and has been authorized for detection and/or diagnosis of SARS-CoV-2 by FDA under an Emergency Use Authorization (EUA). This EUA will remain in effect (meaning this test can be used) for the duration of the COVID-19 declaration under Section 564(b)(1) of the Act, 21 U.S.C. section 360bbb-3(b)(1), unless the authorization is terminated or revoked.     Resp Syncytial Virus by PCR NEGATIVE NEGATIVE Final    Comment: (NOTE) Fact Sheet for Patients: BloggerCourse.com  Fact Sheet for Healthcare Providers: SeriousBroker.it  This test is not yet approved or cleared by the [place] FDA and has been authorized for detection and/or diagnosis of SARS-CoV-2  by FDA under an Emergency Use Authorization (EUA). This EUA will remain in effect (meaning this test can be used) for the duration of the COVID-19 declaration under Section 564(b)(1) of the Act, 21 U.S.C. section 360bbb-3(b)(1), unless the authorization is terminated or revoked.  Performed at [facility], [address] [place], [place] [address]     Radiology Studies: DG CHEST PORT 1 VIEW Result Date: 12/04/2023 CLINICAL DATA:  Shortness of breath EXAM: PORTABLE CHEST 1 VIEW COMPARISON:  12/02/2023 FINDINGS: Upper normal heart size. Increasing bibasilar opacities and pleural effusion. Developing interstitial and reticular opacities in both lungs, suspicious for pulmonary edema. No pneumothorax. Scoliotic curvature of the spine. IMPRESSION: Increasing bibasilar opacities and pleural effusions. Developing interstitial and reticular opacities in both lungs, suspicious for pulmonary edema. Electronically Signed   By: Narda Rutherford M.D.   On: 12/04/2023 14:49  DG Chest Port 1 View Result Date: 12/02/2023 CLINICAL DATA:  Shortness of breath. EXAM: PORTABLE CHEST 1 VIEW COMPARISON:  11/30/2023. FINDINGS: Stable cardiomediastinal silhouette. Emphysematous changes again seen. Similar bibasilar atelectatic changes. No focal consolidation, sizeable effusion, or pneumothorax. Lower cervical fixation hardware. Bilateral breast prosthesis. No acute osseous abnormality. IMPRESSION: Emphysema.  Similar bibasilar atelectatic changes. Electronically Signed   By: Hart Robinsons M.D.   On: 12/02/2023 16:11   Scheduled Meds:  apixaban  5 mg Oral BID   arformoterol  15 mcg Nebulization BID   atorvastatin  40 mg Oral Daily   budesonide (PULMICORT) nebulizer solution  0.25 mg Nebulization BID   citalopram  20 mg Oral Daily   furosemide  20 mg Intravenous Q12H   ipratropium  0.5 mg Nebulization Q6H   levalbuterol  0.63 mg Nebulization Q6H   methylPREDNISolone (SOLU-MEDROL) injection  80 mg Intravenous  Q24H   midodrine  10 mg Oral TID WC   potassium chloride  40 mEq Oral BID   Continuous Infusions:   LOS: 2 days   Marguerita Merles, DO [facility] Available via Epic secure chat 7am-7pm After these hours, please refer to coverage provider listed on [email or web address] 12/04/2023, 2:59 PM

## 2023-12-05 ENCOUNTER — Inpatient Hospital Stay (HOSPITAL_COMMUNITY)

## 2023-12-05 ENCOUNTER — Other Ambulatory Visit: Payer: Self-pay

## 2023-12-05 ENCOUNTER — Other Ambulatory Visit (HOSPITAL_COMMUNITY)

## 2023-12-05 DIAGNOSIS — J9611 Chronic respiratory failure with hypoxia: Secondary | ICD-10-CM

## 2023-12-05 DIAGNOSIS — J449 Chronic obstructive pulmonary disease, unspecified: Secondary | ICD-10-CM

## 2023-12-05 DIAGNOSIS — R578 Other shock: Secondary | ICD-10-CM | POA: Diagnosis not present

## 2023-12-05 DIAGNOSIS — I340 Nonrheumatic mitral (valve) insufficiency: Secondary | ICD-10-CM | POA: Diagnosis not present

## 2023-12-05 DIAGNOSIS — N179 Acute kidney failure, unspecified: Secondary | ICD-10-CM

## 2023-12-05 DIAGNOSIS — I4891 Unspecified atrial fibrillation: Secondary | ICD-10-CM

## 2023-12-05 DIAGNOSIS — E872 Acidosis, unspecified: Secondary | ICD-10-CM

## 2023-12-05 DIAGNOSIS — D72829 Elevated white blood cell count, unspecified: Secondary | ICD-10-CM

## 2023-12-05 DIAGNOSIS — K7200 Acute and subacute hepatic failure without coma: Secondary | ICD-10-CM

## 2023-12-05 DIAGNOSIS — I5033 Acute on chronic diastolic (congestive) heart failure: Secondary | ICD-10-CM | POA: Diagnosis not present

## 2023-12-05 DIAGNOSIS — I480 Paroxysmal atrial fibrillation: Secondary | ICD-10-CM | POA: Diagnosis not present

## 2023-12-05 DIAGNOSIS — I3139 Other pericardial effusion (noninflammatory): Secondary | ICD-10-CM | POA: Diagnosis not present

## 2023-12-05 DIAGNOSIS — R579 Shock, unspecified: Secondary | ICD-10-CM

## 2023-12-05 DIAGNOSIS — E8729 Other acidosis: Secondary | ICD-10-CM

## 2023-12-05 LAB — FERRITIN: Ferritin: >7500 ng/mL — ABNORMAL HIGH (ref 11–307)

## 2023-12-05 LAB — CBC WITH DIFFERENTIAL/PLATELET
Abs Immature Granulocytes: 0.1 10*3/uL — ABNORMAL HIGH (ref 0.00–0.07)
Basophils Absolute: 0 10*3/uL (ref 0.0–0.1)
Basophils Relative: 0 %
Eosinophils Absolute: 0 10*3/uL (ref 0.0–0.5)
Eosinophils Relative: 0 %
HCT: 27.5 % — ABNORMAL LOW (ref 36.0–46.0)
Hemoglobin: 8.8 g/dL — ABNORMAL LOW (ref 12.0–15.0)
Immature Granulocytes: 1 %
Lymphocytes Relative: 3 %
Lymphs Abs: 0.4 10*3/uL — ABNORMAL LOW (ref 0.7–4.0)
MCH: 28.4 pg (ref 26.0–34.0)
MCHC: 32 g/dL (ref 30.0–36.0)
MCV: 88.7 fL (ref 80.0–100.0)
Monocytes Absolute: 1 10*3/uL (ref 0.1–1.0)
Monocytes Relative: 7 %
Neutro Abs: 13.4 10*3/uL — ABNORMAL HIGH (ref 1.7–7.7)
Neutrophils Relative %: 89 %
Platelets: 344 10*3/uL (ref 150–400)
RBC: 3.1 MIL/uL — ABNORMAL LOW (ref 3.87–5.11)
RDW: 15.2 % (ref 11.5–15.5)
WBC: 15 10*3/uL — ABNORMAL HIGH (ref 4.0–10.5)
nRBC: 0 % (ref 0.0–0.2)

## 2023-12-05 LAB — COMPREHENSIVE METABOLIC PANEL WITH GFR
ALT: 2153 U/L — ABNORMAL HIGH (ref 0–44)
ALT: 2215 U/L — ABNORMAL HIGH (ref 0–44)
AST: 3509 U/L — ABNORMAL HIGH (ref 15–41)
AST: 2575 U/L — ABNORMAL HIGH (ref 15–41)
Albumin: 3.8 g/dL (ref 3.5–5.0)
Albumin: 3.6 g/dL (ref 3.5–5.0)
Alkaline Phosphatase: 73 U/L (ref 38–126)
Alkaline Phosphatase: 72 U/L (ref 38–126)
Anion gap: 15 (ref 5–15)
Anion gap: 18 — ABNORMAL HIGH (ref 5–15)
BUN: 39 mg/dL — ABNORMAL HIGH (ref 8–23)
BUN: 42 mg/dL — ABNORMAL HIGH (ref 8–23)
CO2: 30 mmol/L (ref 22–32)
CO2: 29 mmol/L (ref 22–32)
Calcium: 9.5 mg/dL (ref 8.9–10.3)
Calcium: 9.2 mg/dL (ref 8.9–10.3)
Chloride: 96 mmol/L — ABNORMAL LOW (ref 98–111)
Chloride: 95 mmol/L — ABNORMAL LOW (ref 98–111)
Creatinine, Ser: 1.2 mg/dL — ABNORMAL HIGH (ref 0.44–1.00)
Creatinine, Ser: 1.04 mg/dL — ABNORMAL HIGH (ref 0.44–1.00)
GFR, Estimated: 48 mL/min — ABNORMAL LOW (ref >60)
GFR, Estimated: 57 mL/min — ABNORMAL LOW
Glucose, Bld: 176 mg/dL — ABNORMAL HIGH (ref 70–99)
Glucose, Bld: 147 mg/dL — ABNORMAL HIGH (ref 70–99)
Potassium: 4 mmol/L (ref 3.5–5.1)
Potassium: 3.8 mmol/L (ref 3.5–5.1)
Sodium: 141 mmol/L (ref 135–145)
Sodium: 142 mmol/L (ref 135–145)
Total Bilirubin: 0.7 mg/dL (ref 0.0–1.2)
Total Bilirubin: 0.6 mg/dL (ref 0.0–1.2)
Total Protein: 7.4 g/dL (ref 6.5–8.1)
Total Protein: 7 g/dL (ref 6.5–8.1)

## 2023-12-05 LAB — RETICULOCYTES
Immature Retic Fract: 16.6 % — ABNORMAL HIGH (ref 2.3–15.9)
RBC.: 3.09 MIL/uL — ABNORMAL LOW (ref 3.87–5.11)
Retic Count, Absolute: 42.3 10*3/uL (ref 19.0–186.0)
Retic Ct Pct: 1.4 % (ref 0.4–3.1)

## 2023-12-05 LAB — CK: Total CK: 208 U/L (ref 38–234)

## 2023-12-05 LAB — URINALYSIS, COMPLETE (UACMP) WITH MICROSCOPIC
Bilirubin Urine: NEGATIVE
Glucose, UA: NEGATIVE mg/dL
Ketones, ur: NEGATIVE mg/dL
Leukocytes,Ua: NEGATIVE
Nitrite: NEGATIVE
Protein, ur: 100 mg/dL — AB
Specific Gravity, Urine: 1.024 (ref 1.005–1.030)
pH: 5 (ref 5.0–8.0)

## 2023-12-05 LAB — PROTIME-INR
INR: 3.3 — ABNORMAL HIGH (ref 0.8–1.2)
Prothrombin Time: 33.4 s — ABNORMAL HIGH (ref 11.4–15.2)

## 2023-12-05 LAB — IRON AND TIBC
Iron: 86 ug/dL (ref 28–170)
Saturation Ratios: 24 % (ref 10.4–31.8)
TIBC: 364 ug/dL (ref 250–450)
UIBC: 278 ug/dL

## 2023-12-05 LAB — HEPATITIS PANEL, ACUTE
HCV Ab: NONREACTIVE
Hep A IgM: NONREACTIVE
Hep B C IgM: NONREACTIVE
Hepatitis B Surface Ag: NONREACTIVE

## 2023-12-05 LAB — MAGNESIUM: Magnesium: 2 mg/dL (ref 1.7–2.4)

## 2023-12-05 LAB — VITAMIN B12: Vitamin B-12: 4049 pg/mL — ABNORMAL HIGH (ref 180–914)

## 2023-12-05 LAB — OSMOLALITY, URINE: Osmolality, Ur: 751 mosm/kg (ref 300–900)

## 2023-12-05 LAB — CREATININE, URINE, RANDOM: Creatinine, Urine: 161 mg/dL

## 2023-12-05 LAB — LACTIC ACID, PLASMA
Lactic Acid, Venous: 4.3 mmol/L (ref 0.5–1.9)
Lactic Acid, Venous: 4 mmol/L (ref 0.5–1.9)
Lactic Acid, Venous: 4 mmol/L (ref 0.5–1.9)

## 2023-12-05 LAB — SODIUM, URINE, RANDOM: Sodium, Ur: 17 mmol/L

## 2023-12-05 LAB — PROCALCITONIN: Procalcitonin: 1.05 ng/mL

## 2023-12-05 LAB — BRAIN NATRIURETIC PEPTIDE: B Natriuretic Peptide: 2814.3 pg/mL — ABNORMAL HIGH (ref 0.0–100.0)

## 2023-12-05 LAB — PHOSPHORUS: Phosphorus: 5.1 mg/dL — ABNORMAL HIGH (ref 2.5–4.6)

## 2023-12-05 LAB — FOLATE: Folate: >40 ng/mL (ref >5.9)

## 2023-12-05 MED ORDER — AMIODARONE HCL IN DEXTROSE 360-4.14 MG/200ML-% IV SOLN: 60.0000 mg/h | INTRAVENOUS | Status: DC

## 2023-12-05 MED ORDER — VANCOMYCIN HCL 750 MG/150ML IV SOLN
750.0000 mg | Freq: Once | INTRAVENOUS | Status: AC
  Administered 2023-12-05: 750 mg via INTRAVENOUS
  Filled 2023-12-05: qty 150

## 2023-12-05 MED ORDER — SODIUM CHLORIDE 0.9 % IV SOLN
2.0000 g | INTRAVENOUS | Status: DC
  Administered 2023-12-05: 2 g via INTRAVENOUS
  Filled 2023-12-05: qty 12.5

## 2023-12-05 MED ORDER — VANCOMYCIN HCL 500 MG/100ML IV SOLN: 500.0000 mg | INTRAVENOUS | Status: DC

## 2023-12-05 MED ORDER — MILRINONE LACTATE IN DEXTROSE 20-5 MG/100ML-% IV SOLN
0.1250 ug/kg/min | INTRAVENOUS | Status: DC
  Administered 2023-12-05: 0.25 ug/kg/min via INTRAVENOUS
  Administered 2023-12-07: 0.125 ug/kg/min via INTRAVENOUS
  Filled 2023-12-05 (×2): qty 100

## 2023-12-05 NOTE — Progress Notes (Signed)
   12/05/23 1010  Assess: MEWS Score  Pulse Rate 97  ECG Heart Rate 98  Resp (!) 32  SpO2 96 %  Assess: MEWS Score  MEWS Temp 0  MEWS Systolic 0  MEWS Pulse 0  MEWS RR 2  MEWS LOC 0  MEWS Score 2  MEWS Score Color Yellow  Assess: if the MEWS score is Yellow or Red  Were vital signs accurate and taken at a resting state? Yes  MEWS guidelines implemented  No, previously yellow, continue vital signs every 4 hours  Notify: Charge Nurse/RN  Name of Charge Nurse/RN Notified Jen  Assess: SIRS CRITERIA  SIRS Temperature  0  SIRS Respirations  1  SIRS Pulse 1  SIRS WBC 1  SIRS Score Sum  3

## 2023-12-05 NOTE — Progress Notes (Signed)
   12/05/23 0945  Assess: MEWS Score  Temp 97.6 F (36.4 C)  BP 110/81  MAP (mmHg) 91  Pulse Rate (!) 130  ECG Heart Rate (!) 139  Resp (!) 31  SpO2 93 %  Assess: MEWS Score  MEWS Temp 0  MEWS Systolic 0  MEWS Pulse 3  MEWS RR 2  MEWS LOC 0  MEWS Score 5  MEWS Score Color Red  Assess: if the MEWS score is Yellow or Red  Were vital signs accurate and taken at a resting state? Yes  Notify: Charge Nurse/RN  Name of Charge Nurse/RN Notified Jen  Assess: SIRS CRITERIA  SIRS Temperature  0  SIRS Respirations  1  SIRS Pulse 1  SIRS WBC 1  SIRS Score Sum  3

## 2023-12-05 NOTE — Progress Notes (Signed)
   12/05/23 1309  Assess: MEWS Score  Temp 97.8 F (36.6 C)  BP 128/82  MAP (mmHg) 96  Pulse Rate (!) 102  ECG Heart Rate (!) 102  Resp (!) 37  SpO2 97 %  O2 Device Nasal Cannula  O2 Flow Rate (L/min) 6 L/min  Assess: MEWS Score  MEWS Temp 0  MEWS Systolic 0  MEWS Pulse 1  MEWS RR 3  MEWS LOC 0  MEWS Score 4  MEWS Score Color Red  Assess: if the MEWS score is Yellow or Red  Were vital signs accurate and taken at a resting state? Yes  MEWS guidelines implemented  No, previously yellow, continue vital signs every 4 hours  Notify: Charge Nurse/RN  Name of Charge Nurse/RN Notified Jen  Assess: SIRS CRITERIA  SIRS Temperature  0  SIRS Respirations  1  SIRS Pulse 1  SIRS WBC 1  SIRS Score Sum  3

## 2023-12-05 NOTE — Progress Notes (Signed)
 Physician notified of heart rate in the 150's per ccmd

## 2023-12-05 NOTE — Progress Notes (Signed)
 Patient all of a sudden went to Afib/RVR and fluctuating in the 140s to 160s. Notified Dr. Antionette Char via page, and awaiting for response,.

## 2023-12-05 NOTE — Consult Note (Addendum)
 NAME:  [name], MRN:  [id number], DOB:  06-29-1951, LOS: 3 ADMISSION DATE:  12/02/2023, CONSULTATION DATE:  12/05/2023 REFERRING MD: Merlene Laughter, DO, CHIEF COMPLAINT: Afib/RVR   History of Present Illness:  A 73 yr old female patient with Afib on NOAC, COPD and chronic hypoxic hypercapnic resp failure on 3 L [place], D-CHF (EF 60%, with severe MR), dyslipidemia, GERD, recent bimalleolar ankle fracture after fall, anxiety, depression, dysphagia, and anemia of chronic illness, who presented to the ER 2 days ago from SN[facility] with epigastric abd pain, chest pain, and SOB. Associated with generalized weakness and intermittent palpitations. Denied f/c/r, cough, wheezing, N/V/D, URTI symptoms, and rash.  She has LL edema and started on IV Lasix. Developed hypotension in ED and BB/CCB and Lasix were d/c, and started on Midodrine and given IV Albumin. She developed  Afib/RVR today (150 on tele) and soft BP reading. PCCM is consulted for possible transfer to higher level of care.  Currently, she is in sinus 105, BP is 128/80, 4 L [place] (SpO2 96%), and feels fine. Denied fever, CP, abd pain, N/V/D, worsening SOB, cough, and wheezing. LL edema is better. Quit smoking 30 yrs ago. Compliant with meds.  Pertinent  Medical History  Afib on NOAC, COPD and chronic hypoxic hypercapnic resp failure on 3 L [place], D-CHF (EF 60%, with severe MR), dyslipidemia, GERD, recent bimalleolar ankle fracture after fall, anxiety, depression, dysphagia, anemia of chronic illness  Significant Hospital Events: Including procedures, antibiotic start and stop dates in addition to other pertinent events   4/4: hypotension in ED and BB/CCB and Lasix were d/c, and started on Midodrine and given IV Albumin.  4/6: Afib/RVR today (150 on tele) and soft BP reading.    Interim History / Subjective:    Objective   Blood pressure 110/81, pulse 97, temperature 97.6 F (36.4 C), temperature source Axillary, resp. rate (!) 32, height 5'  (1.524 m), weight 37.2 kg, SpO2 96%.        Intake/Output Summary (Last 24 hours) at 12/05/2023 1326 Last data filed at 12/05/2023 1100 Gross per 24 hour  Intake 190 ml  Output 510 ml  Net -320 ml   Filed Weights   12/03/23 0322 12/04/23 0530 12/05/23 0702  Weight: 40 kg 38 kg 37.2 kg    Examination: General: alert, oriented x4, and comfortable. On 4 L [place]. SpO2 96%  HENT: PERL, normal pharynx and oral mucosa. No LNE or thyromegaly. No JVD Lungs: fair symmetrical air entry bilaterally with basal crackles. No wheezing Cardiovascular: NL S1/S2. No m/g/r Abdomen: no distension or tenderness Extremities: trace edema. Symmetrical  Neuro: nonfocal    Resolved Hospital Problem list     Assessment & Plan:  Afib/RVR leading hypotension and lactic acidosis: probably over diuresis and LABA/scheduled scheduled SABA. Now in sinus -Recommend holding Amiodarone due to shock liver -BB IV PRN -Seen by cardiology, Echo: EF 60%, severe MR. ?MTEER consideration -U/S liver and serial LFTs. Hepatitis panel is negative  -Gentle diuresis -I/O chart -K>4 and Mg>2 -Recent TSH was NL -d/c LABA and scheduled SABA  -NOAC -Serial LA  -PCT -Midodrine   D-CHF (EF 60%, with severe MR), with acute exacerbation leading to pulm edema and bilateral small pleural effusion   -as above -Consider CPAP/BiPAP PRN  COPD and chronic hypoxic hypercapnic resp failure on 3 L [place] -Resume home LAMA -SABA PRN -I/S -Keep SpO2: 94-96%  Dysphagia: seen by SP -HOB elevation  Anemia of chronic illness -Monitor labs  FULL CODE  Will monitor in tele.  Thank you  Labs   CBC: Recent Labs  Lab 11/30/23 1722 12/02/23 1449 12/03/23 1037 12/04/23 0309 12/05/23 0308  WBC 12.9* 10.7* 8.2 12.1* 15.0*  NEUTROABS 10.3*  --   --  10.3* 13.4*  HGB 10.4* 9.0* 8.2* 8.6* 8.8*  HCT 32.6* 28.6* 26.6* 27.6* 27.5*  MCV 89.3 89.7 89.6 90.5 88.7  PLT 448* 357 397 395 344    Basic Metabolic Panel: Recent Labs  Lab  11/30/23 1722 12/02/23 1449 12/03/23 1037 12/04/23 0309 12/05/23 0308  NA 138 138 139 140 141  K 3.1* 3.9 3.4* 3.5 4.0  CL 94* 95* 95* 96* 96*  CO2 32 31 33* 31 30  GLUCOSE 99 132* 88 279* 176*  BUN 21 23 24* 23 39*  CREATININE 0.83 0.75 0.85 0.90 1.20*  CALCIUM 9.2 9.4 9.1 9.2 9.5  MG 1.8  --  2.0 2.0 2.0  PHOS  --   --  2.5 4.6 5.1*   GFR: Estimated Creatinine Clearance: 24.5 mL/min (A) (by C-G formula based on SCr of 1.2 mg/dL (H)). Recent Labs  Lab 12/02/23 1449 12/03/23 1037 12/04/23 0309 12/05/23 0308 12/05/23 1034  WBC 10.7* 8.2 12.1* 15.0*  --   LATICACIDVEN  --   --   --   --  4.3*    Liver Function Tests: Recent Labs  Lab 11/30/23 1722 12/02/23 1449 12/04/23 0309 12/05/23 0308  AST 23 18 29  3,509*  ALT 14 12 18  2,153*  ALKPHOS 68 49 56 73  BILITOT 0.6 0.3 0.4 0.7  PROT 7.4 6.6 6.5 7.4  ALBUMIN 3.7 3.0* 3.3* 3.8   Recent Labs  Lab 11/30/23 1722  LIPASE 40   No results for input(s): "AMMONIA" in the last 168 hours.  ABG    Component Value Date/Time   TCO2 27 05/21/2009 1550     Coagulation Profile: Recent Labs  Lab 12/05/23 1034  INR 3.3*    Cardiac Enzymes: Recent Labs  Lab 12/05/23 1034  CKTOTAL 208    HbA1C: Hgb A1c MFr Bld  Date/Time Value Ref Range Status  11/15/2023 01:20 AM 5.1 4.8 - 5.6 % Final    Comment:    (NOTE) Pre diabetes:          5.7%-6.4%  Diabetes:              >6.4%  Glycemic control for   <7.0% adults with diabetes   02/23/2023 09:20 AM 5.8 (H) 4.8 - 5.6 % Final    Comment:             Prediabetes: 5.7 - 6.4          Diabetes: >6.4          Glycemic control for adults with diabetes: <7.0     CBG: No results for input(s): "GLUCAP" in the last 168 hours.  Review of Systems:   Review of Systems  Constitutional:  Negative for chills, diaphoresis, fever and weight loss.  HENT:  Negative for congestion, sinus pain and sore throat.   Respiratory:  Positive for shortness of breath. Negative for  cough, hemoptysis, sputum production, wheezing and stridor.   Cardiovascular:  Positive for claudication and leg swelling. Negative for chest pain, palpitations, orthopnea and PND.  Gastrointestinal:  Negative for abdominal pain, nausea and vomiting.  Genitourinary:  Negative for dysuria.  Skin:  Negative for rash.     Past Medical History:  She,  has a past medical history of Anemia,  Arthritis, B12 deficiency, Breast cancer (HCC), Chronic SI joint pain, COPD (chronic obstructive pulmonary disease) (HCC), Fibromyalgia, GERD (gastroesophageal reflux disease), Greater trochanteric bursitis of both hips, Hyperlipidemia, IBS (irritable bowel syndrome), LV dysfunction, Migraines, Osteoporosis, and Oxygen dependent.   Surgical History:   Past Surgical History:  Procedure Laterality Date   ABDOMINAL HYSTERECTOMY     BSO for  Endometriosis   CARDIOVERSION N/A 11/16/2023   Procedure: CARDIOVERSION;  Surgeon: Thomasene Ripple, DO;  Location: [facility];  Service: Cardiovascular;  Laterality: N/A;   CARPAL TUNNEL RELEASE     left   CERVICAL DISC SURGERY     COLONOSCOPY W/ POLYPECTOMY  1999   Dr Kinnie Scales   ESOPHAGEAL DILATION  2005   Dr Kinnie Scales   MASTECTOMY  2009   bilateral, Dr Jamey Ripa   ORIF ANKLE FRACTURE Right 10/12/2023   Procedure: RIGHT OPEN REDUCTION INTERNAL FIXATION (ORIF) ANKLE FRACTURE;  Surgeon: Huel Cote, MD;  Location: [facility] OR;  Service: Orthopedics;  Laterality: Right;   SEPTOPLASTY     TRANSESOPHAGEAL ECHOCARDIOGRAM (CATH LAB) N/A 11/16/2023   Procedure: TRANSESOPHAGEAL ECHOCARDIOGRAM;  Surgeon: Thomasene Ripple, DO;  Location: [facility];  Service: Cardiovascular;  Laterality: N/A;     Social History:   reports that she quit smoking about 33 years ago. Her smoking use included cigarettes. She started smoking about 63 years ago. She has a 30 pack-year smoking history. She has never been exposed to tobacco smoke. She has never used smokeless tobacco. She reports that she  does not currently use drugs after having used the following drugs: Marijuana. She reports that she does not drink alcohol.   Family History:  Her family history includes Breast cancer in her mother; Colon cancer in her sister; Depression in her mother; Diabetes in her mother; Healthy in her daughter; Heart disease in her mother. There is no history of Stroke.   Allergies Allergies  Allergen Reactions   Amoxicillin-Pot Clavulanate     diarrhea   Doxycycline     Nausea & vomiting   Venlafaxine     ? Reaction; ? Blurred vision     Home Medications  Prior to Admission medications   Medication Sig Start Date End Date Taking? Authorizing Provider  acetaminophen (TYLENOL) 500 MG tablet Take 1,000 mg by mouth every 6 (six) hours as needed for moderate pain (pain score 4-6) or mild pain (pain score 1-3).   Yes [provider]  albuterol (VENTOLIN HFA) 108 (90 Base) MCG/ACT inhaler Inhale 2 puffs every 6 hours as needed 02/19/23  Yes Young, Clinton D, MD  amiodarone (PACERONE) 200 MG tablet Take 1 tablet (200 mg total) by mouth daily. 11/24/23  Yes Robbie Lis M, PA-C  apixaban (ELIQUIS) 5 MG TABS tablet Take 1 tablet (5 mg total) by mouth 2 (two) times daily. 11/20/23  Yes Lanae Boast, MD  atorvastatin (LIPITOR) 40 MG tablet Take 1 tablet (40 mg total) by mouth daily. Patient taking differently: Take 40 mg by mouth at bedtime. 11/21/23  Yes Lanae Boast, MD  Calcium Carb-Cholecalciferol 500-10 MG-MCG TABS Take 1 tablet by mouth daily.   Yes [provider]  citalopram (CELEXA) 20 MG tablet Take 1 tablet (20 mg total) by mouth in the morning. 11/21/23  Yes Kc, Ramesh, MD  diltiazem (CARDIZEM CD) 120 MG 24 hr capsule Take 1 capsule (120 mg total) by mouth daily. 02/12/23  Yes Dunn, Dayna N, PA-C  docusate sodium (COLACE) 100 MG capsule Take 100 mg by mouth daily.  Yes [provider]  furosemide (LASIX) 40 MG tablet Take 1 tablet (40 mg total) by mouth daily. 11/21/23  Yes  Lanae Boast, MD  magnesium oxide (MAG-OX) 400 (240 Mg) MG tablet Take 400 mg by mouth daily.   Yes [provider]  methocarbamol (ROBAXIN) 750 MG tablet TAKE 1 TABLET BY MOUTH 2 TIMES DAILY AS NEEDED FOR MUSCLE SPASMS (NON FORMULARY) 10/04/23  Yes Deveshwar, Janalyn Rouse, MD  Multiple Vitamins-Minerals (MULTIVITAMIN WITH MINERALS) tablet Take 1 tablet by mouth daily.   Yes [provider]  ondansetron (ZOFRAN) 4 MG tablet Take 4 mg by mouth every 8 (eight) hours as needed. 11/22/23  Yes [provider]  oxyCODONE (ROXICODONE) 5 MG immediate release tablet Take 1 tablet (5 mg total) by mouth every 4 (four) hours as needed for up to 5 doses for severe pain (pain score 7-10) or breakthrough pain. 11/20/23  Yes Lanae Boast, MD  OXYGEN Inhale 3 L into the lungs continuous.   Yes [provider]  pantoprazole (PROTONIX) 20 MG tablet Take 20 mg by mouth daily. 11/09/23  Yes [provider]  potassium chloride SA (KLOR-CON M) 20 MEQ tablet Take 20 mEq by mouth daily. 12/01/23  Yes [provider]  umeclidinium bromide (INCRUSE ELLIPTA) 62.5 MCG/ACT AEPB Inhale 1 puff into the lungs daily. 02/19/23  Yes Waymon Budge, MD     Critical care time: 69 min     Patrici Ranks, MD [facility] Personal contact information can be found on Amion  If no contact or response made please call [phone]

## 2023-12-05 NOTE — Progress Notes (Addendum)
 Progress Note  Patient Name: [name] Date of Encounter: 12/05/2023 Primary Cardiologist: Rollene Rotunda, MD   Subjective   Pt still anxious   Breathing is fair  Vital Signs    Vitals:   12/05/23 0348 12/05/23 0725 12/05/23 0728 12/05/23 0807  BP: 123/81   121/72  Pulse: 94 93 91 98  Resp: (!) 36 (!) 30 (!) 29 (!) 22  Temp: 97.9 F (36.6 C)   98 F (36.7 C)  TempSrc: Oral   Oral  SpO2: 96% 96% 97% 97%  Weight:      Height:        Intake/Output Summary (Last 24 hours) at 12/05/2023 0813 Last data filed at 12/05/2023 0348 Gross per 24 hour  Intake 220 ml  Output 510 ml  Net -290 ml   Filed Weights   12/02/23 1355 12/03/23 0322 12/04/23 0530  Weight: 38.4 kg 40 kg 38 kg    Physical Exam   GEN: Frail, cachectic 73 yo   Sitting upright in bed Neck: No JVD Cardiac: RRR, II/VI systolic murmur  Respiratory:  Moving air  No rales   GI: Soft, nontender, non-distended  Ext   No LE edema  Labs   Telemetry: Afib and SR  60s to 150bpm  Back in SR    Chemistry Recent Labs  Lab 12/02/23 1449 12/03/23 1037 12/04/23 0309 12/05/23 0308  NA 138 139 140 141  K 3.9 3.4* 3.5 4.0  CL 95* 95* 96* 96*  CO2 31 33* 31 30  GLUCOSE 132* 88 279* 176*  BUN 23 24* 23 39*  CREATININE 0.75 0.85 0.90 1.20*  CALCIUM 9.4 9.1 9.2 9.5  PROT 6.6  --  6.5 7.4  ALBUMIN 3.0*  --  3.3* 3.8  AST 18  --  29 3,509*  ALT 12  --  18 2,153*  ALKPHOS 49  --  56 73  BILITOT 0.3  --  0.4 0.7  GFRNONAA >60 >60 >60 48*  ANIONGAP 12 11 13 15      Hematology Recent Labs  Lab 12/03/23 1037 12/04/23 0309 12/05/23 0308  WBC 8.2 12.1* 15.0*  RBC 2.97* 3.05* 3.10*  3.09*  HGB 8.2* 8.6* 8.8*  HCT 26.6* 27.6* 27.5*  MCV 89.6 90.5 88.7  MCH 27.6 28.2 28.4  MCHC 30.8 31.2 32.0  RDW 15.0 15.1 15.2  PLT 397 395 344   BNP Recent Labs  Lab 11/30/23 1752 12/02/23 1449 12/04/23 0309  BNP 208.5* 746.7* 2,749.1*     Cardiac Studies   Cardiac Studies & Procedures    ______________________________________________________________________________________________     ECHOCARDIOGRAM  ECHOCARDIOGRAM COMPLETE 11/15/2023  Narrative ECHOCARDIOGRAM REPORT    Patient Name:   [name] Date of Exam: 11/15/2023 Medical Rec #:  [id number]         Height:       60.0 in Accession #:    [id number]        Weight:       92.0 lb Date of Birth:  1951-04-28        BSA:          1.342 m Patient Age:    72 years          BP:           135/93 mmHg Patient Gender: F                 HR:           133 bpm. Exam Location:  Inpatient  Procedure: 2D Echo, Color Doppler and Cardiac Doppler (Both Spectral and Color Flow Doppler were utilized during procedure).  Indications:    Atrial fibrillation  History:        Patient has prior history of Echocardiogram examinations. Cardiomyopathy, COPD, Arrythmias:Tachycardia; Risk Factors:Former Smoker.  Sonographer:    Lamont Snowball Referring Phys: [id number] SARA-MAIZ A THOMAS  IMPRESSIONS   1. Left ventricular ejection fraction, by estimation, is 60 to 65%. The left ventricle has normal function. The left ventricle has no regional wall motion abnormalities. Left ventricular diastolic function could not be evaluated. 2. Right ventricular systolic function is normal. The right ventricular size is normal. There is normal pulmonary artery systolic pressure. The estimated right ventricular systolic pressure is 35.8 mmHg. 3. Left atrial size was severely dilated. 4. Right atrial size was severely dilated. 5. Moderate pericardial effusion. The pericardial effusion is posterior to the left ventricle. There is no evidence of cardiac tamponade. 6. The mitral valve is abnormal. Severe mitral valve regurgitation. No evidence of mitral stenosis. 7. The aortic valve is tricuspid. There is mild calcification of the aortic valve. Aortic valve regurgitation is not visualized. Aortic valve sclerosis/calcification is present, without any  evidence of aortic stenosis. 8. Aortic dilatation noted. There is mild dilatation of the ascending aorta, measuring 41 mm. 9. The inferior vena cava is dilated in size with <50% respiratory variability, suggesting right atrial pressure of 15 mmHg.  FINDINGS Left Ventricle: Left ventricular ejection fraction, by estimation, is 60 to 65%. The left ventricle has normal function. The left ventricle has no regional wall motion abnormalities. The left ventricular internal cavity size was normal in size. There is no left ventricular hypertrophy. Left ventricular diastolic function could not be evaluated due to atrial fibrillation. Left ventricular diastolic function could not be evaluated.  Right Ventricle: The right ventricular size is normal. No increase in right ventricular wall thickness. Right ventricular systolic function is normal. There is normal pulmonary artery systolic pressure. The tricuspid regurgitant velocity is 2.28 m/s, and with an assumed right atrial pressure of 15 mmHg, the estimated right ventricular systolic pressure is 35.8 mmHg.  Left Atrium: Left atrial size was severely dilated.  Right Atrium: Right atrial size was severely dilated.  Pericardium: A moderately sized pericardial effusion is present. The pericardial effusion is posterior to the left ventricle. There is no evidence of cardiac tamponade.  Mitral Valve: The mitral valve is abnormal. Severe mitral valve regurgitation, with centrally-directed jet. No evidence of mitral valve stenosis. MV peak gradient, 10.2 mmHg. The mean mitral valve gradient is 4.3 mmHg.  Tricuspid Valve: The tricuspid valve is normal in structure. Tricuspid valve regurgitation is mild . No evidence of tricuspid stenosis.  Aortic Valve: The aortic valve is tricuspid. There is mild calcification of the aortic valve. Aortic valve regurgitation is not visualized. Aortic valve sclerosis/calcification is present, without any evidence of aortic stenosis.  Aortic valve peak gradient measures 7.8 mmHg.  Pulmonic Valve: The pulmonic valve was normal in structure. Pulmonic valve regurgitation is trivial. No evidence of pulmonic stenosis.  Aorta: Aortic dilatation noted. There is mild dilatation of the ascending aorta, measuring 41 mm.  Venous: The inferior vena cava is dilated in size with less than 50% respiratory variability, suggesting right atrial pressure of 15 mmHg.  IAS/Shunts: No atrial level shunt detected by color flow Doppler.   LEFT VENTRICLE PLAX 2D LVIDd:         4.60 cm LVIDs:  3.00 cm LV PW:         1.30 cm LV IVS:        0.60 cm LVOT diam:     1.90 cm LV SV:         50 LV SV Index:   37 LVOT Area:     2.84 cm   RIGHT VENTRICLE          IVC RV Basal diam:  3.10 cm  IVC diam: 2.10 cm  LEFT ATRIUM             Index        RIGHT ATRIUM           Index LA diam:        4.50 cm 3.35 cm/m   RA Area:     20.90 cm LA Vol (A2C):   63.4 ml 47.25 ml/m  RA Volume:   51.50 ml  38.38 ml/m LA Vol (A4C):   57.9 ml 43.15 ml/m LA Biplane Vol: 64.8 ml 48.29 ml/m AORTIC VALVE AV Area (Vmax): 2.54 cm AV Vmax:        139.67 cm/s AV Peak Grad:   7.8 mmHg LVOT Vmax:      125.33 cm/s LVOT Vmean:     79.300 cm/s LVOT VTI:       0.175 m  AORTA Ao Root diam: 3.70 cm Ao Asc diam:  4.10 cm  MITRAL VALVE                 TRICUSPID VALVE MV Area (PHT): 4.79 cm      TR Peak grad:   20.8 mmHg MV Area VTI:   2.55 cm      TR Vmax:        228.00 cm/s MV Peak grad:  10.2 mmHg MV Mean grad:  4.3 mmHg      SHUNTS MV Vmax:       1.59 m/s      Systemic VTI:  0.17 m MV Vmean:      94.9 cm/s     Systemic Diam: 1.90 cm MV Decel Time: 158 msec MR Peak grad:   115.3 mmHg MR Mean grad:   86.0 mmHg MR Vmax:        537.00 cm/s MR Vmean:       445.5 cm/s MR PISA:        4.02 cm MR PISA Radius: 0.80 cm MV E velocity: 149.67 cm/s  Arvilla Meres MD Electronically signed by Arvilla Meres MD Signature Date/Time:  11/15/2023/11:11:39 AM    Final   TEE  ECHO TEE 11/16/2023  Narrative TRANSESOPHOGEAL ECHO REPORT    Patient Name:   SHERRIKA WEAKLAND Date of Exam: 11/16/2023 Medical Rec #:  [id number]         Height:       60.0 in Accession #:    [id number]        Weight:       77.6 lb Date of Birth:  18-Dec-1950        BSA:          1.248 m Patient Age:    72 years          BP:           111/66 mmHg Patient Gender: F                 HR:           99 bpm. Exam Location:  Inpatient  Procedure: Color  Doppler, Cardiac Doppler and Transesophageal Echo (Both Spectral and Color Flow Doppler were utilized during procedure).  Indications:     Cardioversion  History:         Patient has prior history of Echocardiogram examinations, most recent 11/15/2023.  Sonographer:     Harriette Bouillon RDCS Referring Phys:  [id number] ZANE ADAMS Diagnosing Phys: Thomasene Ripple DO  PROCEDURE: The transesophogeal probe was passed without difficulty through the esophogus of the patient. Sedation performed by different physician. The patient developed no complications during the procedure.  IMPRESSIONS   1. Left ventricular ejection fraction, by estimation, is 55 to 60%. The left ventricle has normal function. 2. Right ventricular systolic function is normal. The right ventricular size is normal. 3. Left atrial size was mildly dilated. No left atrial/left atrial appendage thrombus was detected. 4. The mitral valve is abnormal. Severe mitral valve regurgitation. No evidence of mitral stenosis. 5. The tricuspid valve is abnormal. Tricuspid valve regurgitation is mild to moderate. 6. The aortic valve is tricuspid. Aortic valve regurgitation is trivial. No aortic stenosis is present. 7. There is borderline dilatation of the ascending aorta, measuring 36 mm.  FINDINGS Left Ventricle: Left ventricular ejection fraction, by estimation, is 55 to 60%. The left ventricle has normal function. The left ventricular internal cavity  size was small.  Right Ventricle: The right ventricular size is normal. No increase in right ventricular wall thickness. Right ventricular systolic function is normal.  Left Atrium: Left atrial size was mildly dilated. No left atrial/left atrial appendage thrombus was detected.  Right Atrium: Right atrial size was not well visualized.  Pericardium: Trivial pericardial effusion is present. The pericardial effusion is circumferential.  Mitral Valve: The mitral valve is abnormal. Severe mitral valve regurgitation. No evidence of mitral valve stenosis.  Tricuspid Valve: The tricuspid valve is abnormal. Tricuspid valve regurgitation is mild to moderate. No evidence of tricuspid stenosis.  Aortic Valve: The aortic valve is tricuspid. Aortic valve regurgitation is trivial. No aortic stenosis is present.  Pulmonic Valve: The pulmonic valve was not well visualized. Pulmonic valve regurgitation is trivial. No evidence of pulmonic stenosis.  Aorta: There is borderline dilatation of the ascending aorta, measuring 36 mm. There is minimal (Grade I) layered plaque involving the aortic root.  Pulmonary Artery: The pulmonary artery is of normal size.  IAS/Shunts: No atrial level shunt detected by color flow Doppler.    AORTA Ao Asc diam: 3.60 cm  Kardie Tobb DO Electronically signed by Thomasene Ripple DO Signature Date/Time: 11/16/2023/6:05:04 PM    Final  MONITORS  LONG TERM MONITOR (3-14 DAYS) 03/17/2023  Narrative The predominant rhythm was normal sinus. There were runs of supraventricular tachycardia noted with the longest lasting 11 seconds Rare ventricular ectopy.       ______________________________________________________________________________________________        Assessment & Plan    HFpEF    Volume status overall appears OK   With severe MR and PAF will need to follow closely, will be recurrent problem Continue lasix 20 iv bid for now   Mitral Regurgiation   Severe   PT with severe MR   As noted by Merlyn Lot in previous notes structural team will need to evaluate, see if patinet is a candidate for mTEER.   PAF Pt in and out of SR/afib  with RVR   Rates 60s to 150  Review of orders she has not been on amiodarone here   I would start as IV for now for rate control, possible rhythm control  Amiodarone is not optimal given lung dz and hx of bradycardia   If rates too low only other option would be PPM with or without AVN ablation   Again, would need to be reviewed by EP if needed  Continue anticoagulation    Hypotension Pt on midodrine 10 tid   Follow      CAD   On CT scan    No CP    COPD -  On chronic 3 L oxygen at home 24/7 since April 2024    Pt remains anxious   For questions or updates, please contact [facility] Please consult [email or web address] for contact info under Cardiology/STEMI.   Dietrich Pates MD   ADDENDUM:   After signing note, review of LFTs done today show severe elevation of LFTs  New from yesterday   Lactic acid over 4   Consistent with shock liver   Advanced CHF/Shock team notified to see pt today CCM has been notified   Pt will need central access.  Dietrich Pates, MD

## 2023-12-05 NOTE — Progress Notes (Addendum)
 PROGRESS NOTE    [name]  ZOX:[id number] DOB: June 02, 1951 DOA: 12/02/2023 PCP: Melida Quitter, PA   Brief Narrative:  [name] is a 73 y.o. female with medical history significant for COPD on 3 L nasal cannula continuously, nonischemic cardiomyopathy, hyperlipidemia, GERD, recent bimalleolar ankle fracture after fall, paroxysmal A-fib/A-flutter on Eliquis, history of severe MR, moderate pericardial effusion, chronic HFrEF, who presents to the ER via EMS from SNF due to worsening shortness of breath.  Associated with generalized weakness and intermittent palpitations.  No reported chest pain.   In the ER, cardiomegaly with concern for edema on chest x-ray.  BNP elevated greater than 700.  Bilateral lower extremity edema.  The patient received IV Lasix in the ER.  EDP discussed the case with cardiology who recommended medicine admission for further diuresis and management of acute on chronic HFpEF.  Admitted by [facility], hospitalist service.   **Interim History Diuresis was initially held given her low blood pressure so she is been initiated on midodrine and proceed w/ diuresis now that BP improved.  PT OT recommended SNF.  Cardiology following and will get the structural heart team to evaluate. Palliative Care Medicine now evaluating and patient wishes for full scope of care and wants to remain FULL Code.   Today her labs worsened and her respiratory status continued worsen.  Renal function also worsened and she was hypoperfusing as her lactic acid level continue to trend up.  PCCM and advanced heart failure team were consulted.  Assessment and Plan:  Acute on Chronic Diastolic HFpEF / Sever MR : Presented with BNP of 746.7 and worsened to 2,749.1 yesterday and today was 2814.3. Has lower extremity edema, progressive dyspnea. IV diuresing initiated in the ER but held now due to BP. Seen by Cardiology and getting IV Albumin and Midodrine. Start strict I's and O's and daily weight.  Recent echo done on 11/16/2023 revealed LVEF 60 to 65% and no regional wall motion abnormalities.  Resumed Diuretics with IV Lasix 20 mg BID and give an additional 20 mg x1. Per Cardiology she is a procedural candidate for MTEER with posterior restriction and mild aortic dilatation we will due to off axis images.  They are recommending structural heart team to evaluate. Palliative care consultation for goals of care discussion. PT/OT recommending SNF.  Advanced heart failure team consulted  Shock with hypoperfusion resulting in AKI, acute liver injury and lactic acidosis - Patient is hypoperfusing LFTs acutely worsened and started renal function - Advanced heart failure team consulted by cardiology and they are recommending PICC line placement him checking CVP - WBC is worsening so she has been initiated on antibiotics. - Will continue diuresis per cardiology given that she appears volume overloaded - PCCM also consulted for further evaluation; she has not been initiated on broad-spectrum antibiotics and getting pancultured  Shock liver/acute liver injury: AST and ALT Trend: Recent Labs  Lab 11/15/23 0400 11/30/23 1722 12/02/23 1449 12/04/23 0309 12/05/23 0308 12/05/23 1624  AST 65* 23 18 29  3,509* 2,575*  ALT 17 14 12 18  2,153* 2,215*  BILITOT 1.9* 0.6 0.3 0.4 0.7 0.6  - Continue monitor and trend and repeat CMP in the a.m. and will be obtaining a right upper quadrant ultrasound and acute hepatitis panel which is negative. Right upper quadrant ultrasound done showed "Portal venous system is patent with normal direction of flow. Concern for gallbladder wall thickening and pericholecystic fluid. In addition, there appears to be trace perihepatic ascites. Study has technical limitations  and the spleen is poorly characterized."  AKI: BUN/Cr Trend: Recent Labs  Lab 11/24/23 1522 11/30/23 1722 12/02/23 1449 12/03/23 1037 12/04/23 0309 12/05/23 0308 12/05/23 1624  BUN 24* 21 23 24* 23 39*  42*  CREATININE 0.95 0.83 0.75 0.85 0.90 1.20* 1.04*  -Avoid Nephrotoxic Medications, Contrast Dyes, Hypotension and Dehydration to Ensure Adequate Renal Perfusion and will need to Renally Adjust Meds -Repeat UA showed cloudy appearance with amber color urine, small hemoglobin, 100 protein, rare bacteria, 11-20 WBCs.  Urine osmolality was 751, urine sodium 17, urine creatinine was 161 -Continue to Monitor and Trend Renal Function carefully and repeat CMP in the AM   Acute on Chronic Respiratory Failure with Hypoxia / Hx of COPD on 3 L: Worsening change to Xopenex/Atrovent q6h, Brovana and Budesonide. Add Flutter Valve and Incentive Spirometry.  Maintain O2 saturation above 90%. SpO2: 98 % O2 Flow Rate (L/min): 6 L/min. Repeat CXR this AM pending    Hypotension, improved: Held Antihypertensives. C/w Midodrine 10 mg po TID. Given IV Albumin 12.5 grams x1 yesterday Maintain MAP greater than 65. Closely monitor vital signs while on diuretics    Persistent A-fib/A-flutter on Eliquis: Resume home Apixaban 5 mg po BID; Rate was controlled but went into RVR overnight. CTM on Telemetry. Given IV Metoprolol 5 mg q2hprn for HR >115. Holding Antihypertensives and beta-blocking and AV nodal blocking agents given Soft BP.  Went into A-fib with RVR and cardiology had initiated IV amiodarone but was discontinued given her shock liver  Dysphagia: Having trouble swallowing pills per report. SLP evaluated and now recommending Regular Diet with Thin Liquids but since she is still having issues will have SLP re-evaluate and place back on Soft Diet .   Hypokalemia: K+ is now 3.8. CTM and Replete as Necessary. Repeat CMP in the AM   Hyperlipidemia: Discontinue atorvastatin 40 mg po Daily   Chronic Anxiety/Depression: C/w Citalopram 20 mg po Dally    Normocytic Anemia: Hgb/Hct went from 9.0/28.6 -> 8.2/26.6 -> 8.6/27.6 -> 8.8/27.5. Checked Anemia Panel showed an iron level 86, UIBC 278, TIBC 364, saturation ratios of  24%, ferritin level greater than 7500, folate level greater than 40, vitamin B12 level 4049 CTM for S/Sx of Bleeding; No overt bleeding noted. Repeat CBC in the AM   Generalized Weakness and Deconditioning: PT/OT recommending SNF once medically stable. Fall precautions  Hypoalbuminemia: Patient's Albumin Level went from 3.7 -> 3.0 -> 3.3 -> 3.8 -> 3.6. CTM and Trend and repeat CMP in the AM  Severe Protein Calorie Malnutrition/ Underweight and failure to thrive: Nutrition Status- Nutrition Problem: Severe Malnutrition Etiology: chronic illness (COPD, HFpEF) Signs/Symptoms: moderate muscle depletion, severe fat depletion, severe muscle depletion Interventions: Magic cup, Liberalize Diet, Refer to RD note for recommendations Estimated body mass index is 16.02 kg/m as calculated from the following:   Height as of this encounter: 5' (1.524 m).   Weight as of this encounter: 37.2 kg.   DVT prophylaxis:  apixaban (ELIQUIS) tablet 5 mg    Code Status: Full Code Family Communication: No family currently at bedside  Disposition Plan:  Level of care: Progressive Status is: Inpatient Remains inpatient appropriate because: Needs clinical improvement and clearance by specialist   Consultants:  Palliative care medicine Cardiology Advanced heart failure team PCCM  Procedures:  As delineated as above  Antimicrobials:  Anti-infectives (From admission, onward)    Start     Dose/Rate Route Frequency Ordered Stop   12/07/23 0600  vancomycin (VANCOREADY) IVPB 500 mg/100 mL  500 mg 100 mL/hr over 60 Minutes Intravenous Every 36 hours 12/05/23 1619     12/05/23 1730  vancomycin (VANCOREADY) IVPB 750 mg/150 mL        750 mg 150 mL/hr over 60 Minutes Intravenous  Once 12/05/23 1618     12/05/23 1715  ceFEPIme (MAXIPIME) 2 g in sodium chloride 0.9 % 100 mL IVPB        2 g 200 mL/hr over 30 Minutes Intravenous Every 24 hours 12/05/23 1616         Subjective: Seen and examined at  bedside and think she is doing a little bit better today with her breathing but still very weak and fatigued.  No nausea or vomiting.  Labs were severely off and patient is hypoperfusing so critical care and advanced heart failure team consulted.  They are requesting a PICC line placement for CVP monitoring and she is been initiated on broad-spectrum antibiotics.  Objective: Vitals:   12/05/23 1418 12/05/23 1821 12/05/23 1843 12/05/23 1954  BP:   118/73 129/82  Pulse:  98 96 95  Resp: (!) 27 (!) 36 (!) 35 (!) 32  Temp:   97.6 F (36.4 C) 97.6 F (36.4 C)  TempSrc:   Axillary Oral  SpO2: 97% 98% 99% 98%  Weight:      Height:        Intake/Output Summary (Last 24 hours) at 12/05/2023 2054 Last data filed at 12/05/2023 1800 Gross per 24 hour  Intake 394.97 ml  Output 620 ml  Net -225.03 ml   Filed Weights   12/03/23 0322 12/04/23 0530 12/05/23 0702  Weight: 40 kg 38 kg 37.2 kg   Examination: Physical Exam:  Constitutional: Thin frail chronically ill-appearing cachectic Caucasian female who appears a little uncomfortable and in mild respiratory distress Respiratory: Diminished to auscultation bilaterally with some coarse breath sounds some slight rhonchi and crackles but no appreciable wheezing or rales.  Wearing supplemental oxygen via nasal cannula at 5 L today Cardiovascular: Irregularly irregular and slightly tachycardic, no murmurs / rubs / gallops. S1 and S2 auscultated.  Mild extremity edema.  Abdomen: Soft, non-tender, non-distended. Bowel sounds positive.  GU: Deferred. Musculoskeletal: No clubbing / cyanosis of digits/nails. No joint deformity upper and lower extremities.  Skin: No rashes, lesions, ulcers on limited skin evaluation. No induration; Warm and dry.  Neurologic: CN 2-12 grossly intact with no focal deficits.  Romberg sign and cerebellar reflexes not assessed.  Psychiatric: Normal judgment and insight. Alert and oriented x 3.  Anxious mood and affect.  Data  Reviewed: I have personally reviewed following labs and imaging studies  CBC: Recent Labs  Lab 11/30/23 1722 12/02/23 1449 12/03/23 1037 12/04/23 0309 12/05/23 0308  WBC 12.9* 10.7* 8.2 12.1* 15.0*  NEUTROABS 10.3*  --   --  10.3* 13.4*  HGB 10.4* 9.0* 8.2* 8.6* 8.8*  HCT 32.6* 28.6* 26.6* 27.6* 27.5*  MCV 89.3 89.7 89.6 90.5 88.7  PLT 448* 357 397 395 344   Basic Metabolic Panel: Recent Labs  Lab 11/30/23 1722 12/02/23 1449 12/03/23 1037 12/04/23 0309 12/05/23 0308 12/05/23 1624  NA 138 138 139 140 141 142  K 3.1* 3.9 3.4* 3.5 4.0 3.8  CL 94* 95* 95* 96* 96* 95*  CO2 32 31 33* 31 30 29   GLUCOSE 99 132* 88 279* 176* 147*  BUN 21 23 24* 23 39* 42*  CREATININE 0.83 0.75 0.85 0.90 1.20* 1.04*  CALCIUM 9.2 9.4 9.1 9.2 9.5 9.2  MG 1.8  --  2.0 2.0 2.0  --   PHOS  --   --  2.5 4.6 5.1*  --    GFR: Estimated Creatinine Clearance: 28.3 mL/min (A) (by C-G formula based on SCr of 1.04 mg/dL (H)). Liver Function Tests: Recent Labs  Lab 11/30/23 1722 12/02/23 1449 12/04/23 0309 12/05/23 0308 12/05/23 1624  AST 23 18 29  3,509* 2,575*  ALT 14 12 18  2,153* 2,215*  ALKPHOS 68 49 56 73 72  BILITOT 0.6 0.3 0.4 0.7 0.6  PROT 7.4 6.6 6.5 7.4 7.0  ALBUMIN 3.7 3.0* 3.3* 3.8 3.6   Recent Labs  Lab 11/30/23 1722  LIPASE 40   No results for input(s): "AMMONIA" in the last 168 hours. Coagulation Profile: Recent Labs  Lab 12/05/23 1034  INR 3.3*   Cardiac Enzymes: Recent Labs  Lab 12/05/23 1034  CKTOTAL 208   BNP (last 3 results) No results for input(s): "PROBNP" in the last 8760 hours. HbA1C: No results for input(s): "HGBA1C" in the last 72 hours. CBG: No results for input(s): "GLUCAP" in the last 168 hours. Lipid Profile: No results for input(s): "CHOL", "HDL", "LDLCALC", "TRIG", "CHOLHDL", "LDLDIRECT" in the last 72 hours. Thyroid Function Tests: No results for input(s): "TSH", "T4TOTAL", "FREET4", "T3FREE", "THYROIDAB" in the last 72 hours. Anemia  Panel: Recent Labs    12/05/23 0308  VITAMINB12 4,049*  FOLATE >40.0  FERRITIN >7,500*  TIBC 364  IRON 86  RETICCTPCT 1.4   Sepsis Labs: Recent Labs  Lab 12/05/23 1034 12/05/23 1320 12/05/23 1624 12/05/23 1909  PROCALCITON  --   --  1.05  --   LATICACIDVEN 4.3* 4.0*  --  4.0*    Recent Results (from the past 240 hours)  Resp panel by RT-PCR (RSV, Flu A&B, Covid) Anterior Nasal Swab     Status: None   Collection Time: 12/02/23  2:38 PM   Specimen: Anterior Nasal Swab  Result Value Ref Range Status   SARS Coronavirus 2 by RT PCR NEGATIVE NEGATIVE Final   Influenza A by PCR NEGATIVE NEGATIVE Final   Influenza B by PCR NEGATIVE NEGATIVE Final    Comment: (NOTE) The Xpert Xpress SARS-CoV-2/FLU/RSV plus assay is intended as an aid in the diagnosis of influenza from Nasopharyngeal swab specimens and should not be used as a sole basis for treatment. Nasal washings and aspirates are unacceptable for Xpert Xpress SARS-CoV-2/FLU/RSV testing.  Fact Sheet for Patients: BloggerCourse.com  Fact Sheet for Healthcare Providers: SeriousBroker.it  This test is not yet approved or cleared by the [place] FDA and has been authorized for detection and/or diagnosis of SARS-CoV-2 by FDA under an Emergency Use Authorization (EUA). This EUA will remain in effect (meaning this test can be used) for the duration of the COVID-19 declaration under Section 564(b)(1) of the Act, 21 U.S.C. section 360bbb-3(b)(1), unless the authorization is terminated or revoked.     Resp Syncytial Virus by PCR NEGATIVE NEGATIVE Final    Comment: (NOTE) Fact Sheet for Patients: BloggerCourse.com  Fact Sheet for Healthcare Providers: SeriousBroker.it  This test is not yet approved or cleared by the [place] FDA and has been authorized for detection and/or diagnosis of SARS-CoV-2 by FDA under an  Emergency Use Authorization (EUA). This EUA will remain in effect (meaning this test can be used) for the duration of the COVID-19 declaration under Section 564(b)(1) of the Act, 21 U.S.C. section 360bbb-3(b)(1), unless the authorization is terminated or revoked.  Performed at [facility], [address], [place], [place] [address]  Radiology Studies: US ABDOMEN LIMITED WITH LIVER DOPPLER Result Date: 12/05/2023 CLINICAL DATA:  Abnormal LFTs. EXAM: DUPLEX ULTRASOUND OF LIVER TECHNIQUE: Color and duplex Doppler ultrasound was performed to evaluate the hepatic in-flow and out-flow vessels. COMPARISON:  CT abdomen and pelvis 11/30/1998 FINDINGS: Liver: Normal hepatic contour without nodularity. Liver echogenicity is within normal limits. No discrete hepatic lesion. Gallbladder wall thickening measuring 0.3 cm but no significant gallbladder distension. Concern for pericholecystic fluid. Common bile duct measures 0.2 cm. Main Portal Vein size: 1.4 cm Portal Vein Velocities Main Prox:  16 cm/sec Main Mid: 13 cm/sec Main Dist:  21 cm/sec Right: 13 cm/sec Left: 13 cm/sec Hepatic Vein Velocities Right:  54 cm/sec Middle:  52 cm/sec Left:  58 cm/sec IVC: Patent Hepatic Artery Velocity:  94 cm/sec Splenic Vein Velocity: Not visualized Spleen: Not visualized Portal Vein Occlusion/Thrombus: No Splenic Vein Occlusion/Thrombus: No Ascites: Trace perihepatic ascites Varices: None Normal hepatopetal flow in the portal veins. Normal hepatofugal flow in the hepatic veins. Study has technical limitations and the spleen is poorly characterized. IMPRESSION: 1. Portal venous system is patent with normal direction of flow. 2. Concern for gallbladder wall thickening and pericholecystic fluid. In addition, there appears to be trace perihepatic ascites. 3. Study has technical limitations and the spleen is poorly characterized. Electronically Signed   By: Richarda Overlie M.D.   On: 12/05/2023 20:21   [place] EKG SITE RITE Result  Date: 12/05/2023 If Site Rite image not attached, placement could not be confirmed due to current cardiac rhythm.  [place] EKG SITE RITE Result Date: 12/05/2023 If Site Rite image not attached, placement could not be confirmed due to current cardiac rhythm.  DG CHEST PORT 1 VIEW Result Date: 12/05/2023 CLINICAL DATA:  Shortness of breath. EXAM: PORTABLE CHEST 1 VIEW COMPARISON:  12/04/2023. FINDINGS: The heart size and mediastinal contours are unchanged. Aortic atherosclerosis. Persistent bibasilar opacities and small bilateral pleural effusions. Bilateral interstitial prominence, similar to the prior exam concerning for pulmonary edema. No pneumothorax. Dextroscoliotic curvature of the spine. Cervical fixation hardware. No acute osseous abnormality IMPRESSION: 1. Persistent small bilateral pleural effusions and bibasilar opacities. 2. Findings suggestive of pulmonary edema. Electronically Signed   By: Hart Robinsons M.D.   On: 12/05/2023 09:20   DG CHEST PORT 1 VIEW Result Date: 12/04/2023 CLINICAL DATA:  Shortness of breath EXAM: PORTABLE CHEST 1 VIEW COMPARISON:  12/02/2023 FINDINGS: Upper normal heart size. Increasing bibasilar opacities and pleural effusion. Developing interstitial and reticular opacities in both lungs, suspicious for pulmonary edema. No pneumothorax. Scoliotic curvature of the spine. IMPRESSION: Increasing bibasilar opacities and pleural effusions. Developing interstitial and reticular opacities in both lungs, suspicious for pulmonary edema. Electronically Signed   By: Narda Rutherford M.D.   On: 12/04/2023 14:49   Scheduled Meds:  apixaban  5 mg Oral BID   budesonide (PULMICORT) nebulizer solution  0.25 mg Nebulization BID   citalopram  20 mg Oral Daily   furosemide  20 mg Intravenous Q12H   ipratropium  0.5 mg Nebulization Q6H   midodrine  10 mg Oral TID WC   Continuous Infusions:  ceFEPime (MAXIPIME) IV 2 g (12/05/23 2016)   milrinone 0.25 mcg/kg/min (12/05/23 1655)   [START  ON 12/07/2023] vancomycin     vancomycin      LOS: 3 days   Patient is critically ill with multiple organ system failure and requires high complexity decision making for assessment and support, frequent evaluation and titration of therapies and application of advanced monitoring technologies and extensive interpretation  of multiple databases  CRITICAL CARE TIME: 37 consecutive minutes devoted to patient care services described in this note including but not limited to reviewing the chart, seeing the patient, coordinate care, updating family discussing with appropriate consultants  Marguerita Merles, DO [facility] Available via Epic secure chat 7am-7pm After these hours, please refer to coverage provider listed on [email or web address] 12/05/2023, 8:54 PM

## 2023-12-05 NOTE — Progress Notes (Signed)
 Dr. Antionette Char called back indicating to call the cardiologist since they are following her. Dr. Remus Loffler paged.

## 2023-12-05 NOTE — Progress Notes (Signed)
   12/05/23 2103  Vitals  Temp 97.8 F (36.6 C)  Temp Source Axillary  BP 125/88  MAP (mmHg) 100  BP Location Right Arm  BP Method Automatic  Patient Position (if appropriate) Lying  Pulse Rate (!) 161  ECG Heart Rate (!) 156  Resp (!) 27  Level of Consciousness  Level of Consciousness Alert  MEWS COLOR  MEWS Score Color Red  Oxygen Therapy  SpO2 98 %  O2 Device Nasal Cannula  O2 Flow Rate (L/min) 6 L/min  Patient Activity (if Appropriate) In bed  Pulse Oximetry Type Continuous  Pain Assessment  Pain Scale 0-10  Pain Score 0  MEWS Score  MEWS Temp 0  MEWS Systolic 0  MEWS Pulse 3  MEWS RR 2  MEWS LOC 0  MEWS Score 5  Provider Notification  Provider Name/Title Dr. Remus Loffler  Date Provider Notified 12/05/23  Time Provider Notified 2100  Method of Notification Page  Notification Reason Other (Comment) (Afib/RVR)  Provider response No new orders (Patient has converted back to NSR)  Date of Provider Response 12/05/23  Time of Provider Response 2121  Rapid Response Notification  Name of Rapid Response RN Notified David RN  Date Rapid Response Notified 12/05/23  Time Rapid Response Notified 2104

## 2023-12-05 NOTE — Progress Notes (Signed)
 Patient's HR shoot up again in the 140s 160 s while she's sleeping. Notified Dr. Remus Loffler and she said she'll look at her chart and let me know if anything else needs to be done. Will continue to monitor.

## 2023-12-05 NOTE — Progress Notes (Addendum)
 PICC order received. Discussed contraindication with Dr. Gasper Lloyd as pt has had bilateral mastectomies. Recommend temporary subclavian or internal jugular. OK, per MD. PICC order discontinued.

## 2023-12-05 NOTE — Progress Notes (Signed)
 Pharmacy Antibiotic Note  [name] is a 73 y.o. female to begin empriic Vancomycin and Cefepime bacteremia.  Pharmacy has been consulted for dosing. Cultures ordered.   Plan: Cefepime 2gm IV q24h. Vancomycin 750 mg IV loading dose Vancomycin 500 mg IV q36hrs to begin 4/8 am. Estimated AUC 505 using SCr 1.20 and TBW (since < IBW). Goal AUC 400-550. Monitor renal function, culture data, clinical progress and antibiotic plans.  Height: 5' (152.4 cm) Weight: 37.2 kg (82 lb 0.2 oz) IBW/kg (Calculated) : 45.5  Temp (24hrs), Avg:97.9 F (36.6 C), Min:97.6 F (36.4 C), Max:98 F (36.7 C)  Recent Labs  Lab 11/30/23 1722 12/02/23 1449 12/03/23 1037 12/04/23 0309 12/05/23 0308 12/05/23 1034 12/05/23 1320  WBC 12.9* 10.7* 8.2 12.1* 15.0*  --   --   CREATININE 0.83 0.75 0.85 0.90 1.20*  --   --   LATICACIDVEN  --   --   --   --   --  4.3* 4.0*    Estimated Creatinine Clearance: 24.5 mL/min (A) (by C-G formula based on SCr of 1.2 mg/dL (H)).    Allergies  Allergen Reactions   Amoxicillin-Pot Clavulanate     diarrhea   Doxycycline     Nausea & vomiting   Venlafaxine     ? Reaction; ? Blurred vision    Antimicrobials this admission: Cefepime 4/6 >> Vancomycin 4/6 >>  Dose adjustments this admission: n/a  Microbiology results: 4/6 blood:  4/6 MRSA PCR: 4/3 COVID, flu and RSV: negative  Thank you for allowing pharmacy to be a part of this patient's care.  Dennie Fetters, [place] 12/05/2023 4:19 PM

## 2023-12-05 NOTE — Progress Notes (Signed)
   12/05/23 0728  Assess: MEWS Score  Pulse Rate 91  ECG Heart Rate 92  Resp (!) 29  Level of Consciousness Alert  SpO2 97 %  Assess: MEWS Score  MEWS Temp 0  MEWS Systolic 0  MEWS Pulse 0  MEWS RR 2  MEWS LOC 0  MEWS Score 2  MEWS Score Color Yellow  Assess: if the MEWS score is Yellow or Red  Were vital signs accurate and taken at a resting state? Yes  MEWS guidelines implemented  No, previously yellow, continue vital signs every 4 hours  Notify: Charge Nurse/RN  Name of Charge Nurse/RN Notified Jen  Assess: SIRS CRITERIA  SIRS Temperature  0  SIRS Respirations  1  SIRS Pulse 1  SIRS WBC 1  SIRS Score Sum  3

## 2023-12-05 NOTE — Consult Note (Addendum)
   ADVANCED HEART FAILURE CONSULT NOTE  Referring Physician: No ref. provider found  Primary Care: Rachael Quitter, PA Primary Cardiologist:  CC: Cardiogenic shock  HPI: [name] is a 73 y.o. female with COPD on 3 L nasal cannula, last episode with preserved ejection fraction, heart anemia, GERD, bimalleolar ankle fracture after fall, paroxysmal atrial fibrillation/flutter on Eliquis, severe mitral regurgitation that was transferred from her skilled nursing facility due to worsening shortness of breath.  In the emergency department, BNP significantly elevated and patient severely volume overloaded.  She was started on IV diuresis with Lasix 20 mg twice daily.  Telemetry notable for intermittent sinus rhythm versus atrial fibrillation with rates from 60 to 150 bpm.  Patient also started on midodrine 10 mg 3 times daily for hypotension. Number  Today she was noted a drastic rise in her LFTs to the thousands and a lactic acid of 40.  Heart failure consulted for further evaluation.  PHYSICAL EXAM: Vitals:   12/05/23 1309 12/05/23 1418  BP: 128/82   Pulse: (!) 102   Resp: (!) 37 (!) 27  Temp: 97.8 F (36.6 C)   SpO2: 97% 97%   GENERAL: Thin, frail chronically ill-appearing female Lungs- decreased at bases CARDIAC:  tachycardic; irregular ABDOMEN: Soft, non-tender, non-distended.  EXTREMITIES: Warm and well perfused.  NEUROLOGIC: No obvious FND   DATA REVIEW  ECG: 12/02/23: NSR  As per my personal interpretation  ECHO: 11/15/23: LVEF 65%, normal RV function. Severe MR As per my personal interpretation   ASSESSMENT & PLAN:  Undifferentiated shock - I evaluated patient during her liver ultrasound.  IVC 2 cm however not collapsible.  Distal extremities warm to touch with no pulse alternans.   - Place PICC line, obtain mixed venous.  Repeat lactic acid. Start CVP monitoring to guide diuresis. - Repeat TTE pending. - Will prophylactically start milrinone 0.25  mcg/kg/min. - Patient has underlying severe mitral regurgitation.  I am not sure why her LFTs have risen to the thousands acutely.  She appears to have patent flow to the liver however ultrasound not complete at this time. - Repeat lactic acid. - Urinalysis leukocyte and nitrite negative. - BNP greater than 2800 now.  Start CVP monitoring once PICC line is placed - WBC count rising, now 15.  Afebrile.  Blood cultures ordered.  Low threshold to start antibiotics.  CCM aware. - Will start broad spec abx; can D/C tomorrow if work up is negative.   2.  Severe mitral regurgitation - Extremely frail and weak on exam with a BMI of 16.  Patient is unlikely to be a candidate for any advanced therapies including MitraClip.  3. Frailty -Body mass index is 16.02 kg/m.    [name] Advanced Heart Failure Mechanical Circulatory Support

## 2023-12-06 ENCOUNTER — Inpatient Hospital Stay (HOSPITAL_COMMUNITY)

## 2023-12-06 DIAGNOSIS — I428 Other cardiomyopathies: Secondary | ICD-10-CM

## 2023-12-06 DIAGNOSIS — I5033 Acute on chronic diastolic (congestive) heart failure: Secondary | ICD-10-CM | POA: Diagnosis not present

## 2023-12-06 DIAGNOSIS — I340 Nonrheumatic mitral (valve) insufficiency: Secondary | ICD-10-CM | POA: Diagnosis not present

## 2023-12-06 DIAGNOSIS — Z7189 Other specified counseling: Secondary | ICD-10-CM | POA: Diagnosis not present

## 2023-12-06 DIAGNOSIS — Z515 Encounter for palliative care: Secondary | ICD-10-CM | POA: Diagnosis not present

## 2023-12-06 DIAGNOSIS — I480 Paroxysmal atrial fibrillation: Secondary | ICD-10-CM | POA: Diagnosis not present

## 2023-12-06 DIAGNOSIS — I3139 Other pericardial effusion (noninflammatory): Secondary | ICD-10-CM | POA: Diagnosis not present

## 2023-12-06 HISTORY — PX: IR FLUORO GUIDE CV LINE RIGHT: IMG2283

## 2023-12-06 HISTORY — PX: IR US GUIDE VASC ACCESS RIGHT: IMG2390

## 2023-12-06 LAB — CBC WITH DIFFERENTIAL/PLATELET
Abs Immature Granulocytes: 0.19 10*3/uL — ABNORMAL HIGH (ref 0.00–0.07)
Basophils Absolute: 0 10*3/uL (ref 0.0–0.1)
Basophils Relative: 0 %
Eosinophils Absolute: 0 10*3/uL (ref 0.0–0.5)
Eosinophils Relative: 0 %
HCT: 27.5 % — ABNORMAL LOW (ref 36.0–46.0)
Hemoglobin: 8.7 g/dL — ABNORMAL LOW (ref 12.0–15.0)
Immature Granulocytes: 1 %
Lymphocytes Relative: 6 %
Lymphs Abs: 0.9 10*3/uL (ref 0.7–4.0)
MCH: 28.2 pg (ref 26.0–34.0)
MCHC: 31.6 g/dL (ref 30.0–36.0)
MCV: 89 fL (ref 80.0–100.0)
Monocytes Absolute: 1.2 10*3/uL — ABNORMAL HIGH (ref 0.1–1.0)
Monocytes Relative: 9 %
Neutro Abs: 12 10*3/uL — ABNORMAL HIGH (ref 1.7–7.7)
Neutrophils Relative %: 84 %
Platelets: 312 10*3/uL (ref 150–400)
RBC: 3.09 MIL/uL — ABNORMAL LOW (ref 3.87–5.11)
RDW: 15.4 % (ref 11.5–15.5)
WBC: 14.3 10*3/uL — ABNORMAL HIGH (ref 4.0–10.5)
nRBC: 1.5 % — ABNORMAL HIGH (ref 0.0–0.2)

## 2023-12-06 LAB — COMPREHENSIVE METABOLIC PANEL WITH GFR
ALT: 1916 U/L — ABNORMAL HIGH (ref 0–44)
AST: 1706 U/L — ABNORMAL HIGH (ref 15–41)
Albumin: 3.4 g/dL — ABNORMAL LOW (ref 3.5–5.0)
Alkaline Phosphatase: 70 U/L (ref 38–126)
Anion gap: 14 (ref 5–15)
BUN: 39 mg/dL — ABNORMAL HIGH (ref 8–23)
CO2: 33 mmol/L — ABNORMAL HIGH (ref 22–32)
Calcium: 9.2 mg/dL (ref 8.9–10.3)
Chloride: 96 mmol/L — ABNORMAL LOW (ref 98–111)
Creatinine, Ser: 0.85 mg/dL (ref 0.44–1.00)
GFR, Estimated: >60 mL/min (ref >60)
Glucose, Bld: 141 mg/dL — ABNORMAL HIGH (ref 70–99)
Potassium: 3.6 mmol/L (ref 3.5–5.1)
Sodium: 143 mmol/L (ref 135–145)
Total Bilirubin: 0.6 mg/dL (ref 0.0–1.2)
Total Protein: 6.8 g/dL (ref 6.5–8.1)

## 2023-12-06 LAB — ECHOCARDIOGRAM COMPLETE
Height: 60 in
MV M vel: 4.72 m/s
MV Peak grad: 89.2 mmHg
S' Lateral: 2.7 cm
Weight: 1372.14 [oz_av]

## 2023-12-06 LAB — PROTIME-INR
INR: 3.8 — ABNORMAL HIGH (ref 0.8–1.2)
Prothrombin Time: 37.3 s — ABNORMAL HIGH (ref 11.4–15.2)

## 2023-12-06 LAB — LACTIC ACID, PLASMA: Lactic Acid, Venous: 2 mmol/L (ref 0.5–1.9)

## 2023-12-06 LAB — MAGNESIUM: Magnesium: 1.9 mg/dL (ref 1.7–2.4)

## 2023-12-06 MED ORDER — LIDOCAINE HCL 1 % IJ SOLN
20.0000 mL | Freq: Once | INTRAMUSCULAR | Status: AC
  Administered 2023-12-06: 10 mL
  Filled 2023-12-06: qty 20

## 2023-12-06 MED ORDER — DIGOXIN 125 MCG PO TABS
0.1250 mg | ORAL_TABLET | Freq: Every day | ORAL | Status: DC
  Administered 2023-12-07 – 2023-12-08 (×2): 0.125 mg via ORAL
  Filled 2023-12-06 (×2): qty 1

## 2023-12-06 MED ORDER — VANCOMYCIN HCL 500 MG/100ML IV SOLN
500.0000 mg | INTRAVENOUS | Status: DC
  Administered 2023-12-06 – 2023-12-07 (×2): 500 mg via INTRAVENOUS
  Filled 2023-12-06 (×3): qty 100

## 2023-12-06 MED ORDER — SODIUM CHLORIDE 0.9 % IV SOLN
2.0000 g | Freq: Two times a day (BID) | INTRAVENOUS | Status: DC
  Administered 2023-12-06 – 2023-12-08 (×5): 2 g via INTRAVENOUS
  Filled 2023-12-06 (×5): qty 12.5

## 2023-12-06 MED ORDER — DIGOXIN 125 MCG PO TABS
0.2500 mg | ORAL_TABLET | Freq: Once | ORAL | Status: AC
  Administered 2023-12-06: 0.25 mg via ORAL
  Filled 2023-12-06: qty 2

## 2023-12-06 MED ORDER — MIDODRINE HCL 5 MG PO TABS
5.0000 mg | ORAL_TABLET | Freq: Three times a day (TID) | ORAL | Status: DC
  Administered 2023-12-06 – 2023-12-08 (×7): 5 mg via ORAL
  Filled 2023-12-06 (×7): qty 1

## 2023-12-06 MED ORDER — AMIODARONE LOAD VIA INFUSION
150.0000 mg | Freq: Once | INTRAVENOUS | Status: AC
  Administered 2023-12-06: 150 mg via INTRAVENOUS
  Filled 2023-12-06: qty 83.34

## 2023-12-06 MED ORDER — LIDOCAINE-EPINEPHRINE 1 %-1:100000 IJ SOLN
INTRAMUSCULAR | Status: AC
  Filled 2023-12-06: qty 1

## 2023-12-06 MED ORDER — AMIODARONE HCL IN DEXTROSE 360-4.14 MG/200ML-% IV SOLN
60.0000 mg/h | INTRAVENOUS | Status: AC
  Administered 2023-12-06 (×2): 60 mg/h via INTRAVENOUS
  Filled 2023-12-06 (×2): qty 200

## 2023-12-06 MED ORDER — LIDOCAINE HCL 1 % IJ SOLN
INTRAMUSCULAR | Status: AC
  Filled 2023-12-06: qty 20

## 2023-12-06 MED ORDER — AMIODARONE HCL IN DEXTROSE 360-4.14 MG/200ML-% IV SOLN
30.0000 mg/h | INTRAVENOUS | Status: DC
  Administered 2023-12-06 – 2023-12-07 (×3): 30 mg/h via INTRAVENOUS
  Filled 2023-12-06 (×3): qty 200

## 2023-12-06 MED ORDER — VANCOMYCIN HCL 500 MG/100ML IV SOLN: 500.0000 mg | INTRAVENOUS | Status: DC

## 2023-12-06 NOTE — TOC Progression Note (Addendum)
 Transition of Care [facility]) - Progression Note    Patient Details  Name: PAMULA LUTHER MRN: [id number] Date of Birth: 1951/01/16  Transition of Care [facility]) CM/SW Contact  Eduard Roux, [place] Phone Number: 12/06/2023, 2:03 PM  Clinical Narrative:     Patient is from [facility] continue to follow and will assist with discharge planning.  Antony Blackbird, MSW, LCSW Clinical Social Worker    Expected Discharge Plan: Skilled Nursing Facility [facility]) Barriers to Discharge: Continued Medical Work up  Expected Discharge Plan and Services       Living arrangements for the past 2 months: Single Family Home                                       Social Determinants of Health (SDOH) Interventions SDOH Screenings   Food Insecurity: Food Insecurity Present (12/03/2023)  Housing: Low Risk  (12/03/2023)  Recent Concern: Housing - High Risk (11/14/2023)  Transportation Needs: No Transportation Needs (12/03/2023)  Utilities: Not At Risk (12/03/2023)  Alcohol Screen: Low Risk  (11/17/2023)  Depression (PHQ2-9): Medium Risk (09/16/2023)  Financial Resource Strain: Low Risk  (11/17/2023)  Physical Activity: Inactive (05/11/2023)  Social Connections: Socially Isolated (12/03/2023)  Stress: No Stress Concern Present (05/11/2023)  Tobacco Use: Medium Risk (11/30/2023)  Health Literacy: Adequate Health Literacy (05/11/2023)    Readmission Risk Interventions     No data to display

## 2023-12-06 NOTE — Consult Note (Cosign Needed Addendum)
 HEART AND VASCULAR CENTER   MULTIDISCIPLINARY HEART VALVE TEAM  Inpatient MitraClip Consultation:   Patient ID: [name]; [id number]; Feb 06, 1951   Admit date: 12/02/2023 Date of Consult: 12/06/2023  Primary Care Provider: Melida Quitter, PA Primary Cardiologist: Dr. Antoine Poche    Patient Profile:   [name] is a 73 y.o. female with a hx of NICM after breast cancer treatment, COPD on 3L home 02, extreme frailty with a BMI of 16, HFpEF with recurrent admissions, chronic anemia, GERD, PAF on Eliquis, who is being seen today for the evaluation of severe MR at the request of Dr. Ronette Deter.  History of Present Illness:   Ms. [name] lives in [facility]. Per review of palliative care notes, her daughter has described a rapid decline over the last several months. She has had poor oral intake and extreme weight loss. She has extremely poor functional status and is mostly bed/chair bound.   She was admitted 10/2023 for a fall causing an ankle fracture s/p ORIF. She then admitted 3/16-3/20/25 for acute CHF and afib with RVR. TEE 11/16/23 showed EF 55-60%, severe MR and mild to moderate TR. Plans were made for outpatient mTEER evaluation, however, the patient was readmitted 12/02/2023 with acute on chronic diastolic CHF. Patient initially hypotensive and unable to tolerate diuretics. She was started on midodrine and albumin. Palliative care consulted to address goals. She wanted to remain full scope of care until she was evaluated by the structural heart team for consideration of mTEER.   On 12/05/23, she developed undifferentiated shock with lactic acidosis (4.0) and shock liver ( LFTs 3509/ 2153) INR 3.8. Seen by Dr. Gasper Lloyd with the advanced CHF team, PICC line unable to be placed due bilateral mastectomies and started on milrinone. Felt to be an unlikely candidate for mTEER. She then went into wide complex rhythm with HR elevated into 160s. Seen by fellow who felt c/w afib with  aberrancy but no BB given shock and no amio given liver abnormalities. WBC up to 15--> 14.3 and started broad spec abx. CT scan from 11/30/23 with possible pyelonephritis; however, unchanged compared to prior study. INR up to 3.8 concerning for hepatic dysfunction.  The structural heart team is consulted for consideration of mTEER. Pt is seen laying in bed alone. She is non conversant but nods her head no to being asked if she was in pain. Looks overall comfortable at the moment but does have pursed lip breathing. She smiles and agrees when I tell her I would call her daughter.    Past Medical History:  Diagnosis Date   Anemia    B12 deficient   Arthritis    B12 deficiency    Breast cancer (HCC)    left breast.   Chronic SI joint pain    Bilateral, Dr. Corliss Skains   COPD (chronic obstructive pulmonary disease) (HCC)    Fibromyalgia    Dr. Corliss Skains   GERD (gastroesophageal reflux disease)    Greater trochanteric bursitis of both hips    Dr. Corliss Skains   Hyperlipidemia    IBS (irritable bowel syndrome)    LV dysfunction    iatrogenic from chemotherapy ; on Carvedilol    Migraines    Osteoporosis    Dr Lomax---> transferring to a new gyn   Oxygen dependent    3 liters    Past Surgical History:  Procedure Laterality Date   ABDOMINAL HYSTERECTOMY     BSO for  Endometriosis   CARDIOVERSION N/A 11/16/2023   Procedure: CARDIOVERSION;  Surgeon: Thomasene Ripple, DO;  Location: [facility] INVASIVE CV LAB;  Service: Cardiovascular;  Laterality: N/A;   CARPAL TUNNEL RELEASE     left   CERVICAL DISC SURGERY     COLONOSCOPY W/ POLYPECTOMY  1999   Dr Kinnie Scales   ESOPHAGEAL DILATION  2005   Dr Kinnie Scales   MASTECTOMY  2009   bilateral, Dr Jamey Ripa   ORIF ANKLE FRACTURE Right 10/12/2023   Procedure: RIGHT OPEN REDUCTION INTERNAL FIXATION (ORIF) ANKLE FRACTURE;  Surgeon: Huel Cote, MD;  Location: [facility] OR;  Service: Orthopedics;  Laterality: Right;   SEPTOPLASTY     TRANSESOPHAGEAL ECHOCARDIOGRAM (CATH LAB)  N/A 11/16/2023   Procedure: TRANSESOPHAGEAL ECHOCARDIOGRAM;  Surgeon: Thomasene Ripple, DO;  Location: [facility] INVASIVE CV LAB;  Service: Cardiovascular;  Laterality: N/A;     Inpatient Medications: Scheduled Meds:  apixaban  5 mg Oral BID   budesonide (PULMICORT) nebulizer solution  0.25 mg Nebulization BID   citalopram  20 mg Oral Daily   [START ON 12/07/2023] digoxin  0.125 mg Oral Daily   furosemide  20 mg Intravenous Q12H   ipratropium  0.5 mg Nebulization Q6H   midodrine  5 mg Oral TID WC   Continuous Infusions:  amiodarone 60 mg/hr (12/06/23 1025)   Followed by   amiodarone     ceFEPime (MAXIPIME) IV 2 g (12/05/23 2016)   milrinone 0.125 mcg/kg/min (12/06/23 0920)   [START ON 12/07/2023] vancomycin     PRN Meds: acetaminophen, albuterol, melatonin, polyethylene glycol, prochlorperazine  Allergies:    Allergies  Allergen Reactions   Amoxicillin-Pot Clavulanate     diarrhea   Doxycycline     Nausea & vomiting   Venlafaxine     ? Reaction; ? Blurred vision    Social History:   Social History   Socioeconomic History   Marital status: Widowed    Spouse name: Not on file   Number of children: 1   Years of education: Not on file   Highest education level: 12th grade  Occupational History   Occupation: retired, Warehouse manager   Tobacco Use   Smoking status: Former    Current packs/day: 0.00    Average packs/day: 1 pack/day for 30.0 years (30.0 ttl pk-yrs)    Types: Cigarettes    Start date: 08/31/1960    Quit date: 08/31/1990    Years since quitting: 33.2    Passive exposure: Never   Smokeless tobacco: Never  Vaping Use   Vaping status: Never Used  Substance and Sexual Activity   Alcohol use: No   Drug use: Not Currently    Types: Marijuana    Comment: 08/01/2017 last used    Sexual activity: Not Currently  Other Topics Concern   Not on file  Social History Narrative   Lives w/ husband   Social Drivers of Health   Financial Resource Strain: Low Risk  (11/17/2023)    Overall Financial Resource Strain (CARDIA)    Difficulty of Paying Living Expenses: Not hard at all  Food Insecurity: Food Insecurity Present (12/03/2023)   Hunger Vital Sign    Worried About Running Out of Food in the Last Year: Sometimes true    Ran Out of Food in the Last Year: Sometimes true  Transportation Needs: No Transportation Needs (12/03/2023)   PRAPARE - Administrator, Civil Service (Medical): No    Lack of Transportation (Non-Medical): No  Physical Activity: Inactive (05/11/2023)   Exercise Vital Sign    Days of Exercise per Week: 0 days  Minutes of Exercise per Session: 0 min  Stress: No Stress Concern Present (05/11/2023)   [employer] of Occupational Health - Occupational Stress Questionnaire    Feeling of Stress : Not at all  Social Connections: Socially Isolated (12/03/2023)   Social Connection and Isolation Panel [NHANES]    Frequency of Communication with Friends and Family: More than three times a week    Frequency of Social Gatherings with Friends and Family: More than three times a week    Attends Religious Services: Never    Database administrator or Organizations: No    Attends Banker Meetings: Never    Marital Status: Widowed  Intimate Partner Violence: Not At Risk (12/03/2023)   Humiliation, Afraid, Rape, and Kick questionnaire    Fear of Current or Ex-Partner: No    Emotionally Abused: No    Physically Abused: No    Sexually Abused: No    Family History:   The patient's family history includes Breast cancer in her mother; Colon cancer in her sister; Depression in her mother; Diabetes in her mother; Healthy in her daughter; Heart disease in her mother. There is no history of Stroke.  ROS:  Please see the history of present illness.  ROS  All other ROS reviewed and negative.     Physical Exam/Data:   Vitals:   12/06/23 1028 12/06/23 1031 12/06/23 1100 12/06/23 1200  BP: 104/64 100/75 96/69 114/67  Pulse:    92  Resp: (!)  34 (!) 36 (!) 33 20  Temp:    98 F (36.7 C)  TempSrc:    Oral  SpO2:    100%  Weight:      Height:        Intake/Output Summary (Last 24 hours) at 12/06/2023 1240 Last data filed at 12/06/2023 1212 Gross per 24 hour  Intake 531.16 ml  Output 1000 ml  Net -468.84 ml   Filed Weights   12/04/23 0530 12/05/23 0702 12/06/23 0307  Weight: 38 kg 37.2 kg 38.9 kg   Body mass index is 16.75 kg/m.  General:  chronically ill appearing woman with purse lip breathing, extremely thin and weak appearing. HEENT: normal Lymph: no adenopathy Neck: no JVD Cardiac:  normal S1, S2; RRR; very faint holosystolic murmur, distant heart sounds.  Lungs:  diminished at bases Abd: soft, nontender, no hepatomegaly  Ext: no edema Musculoskeletal:  No deformities, BUE and BLE strength normal and equal Skin: warm and dry  Neuro:  CNs 2-12 intact, no focal abnormalities noted Psych: flat, non communicative.   EKG:  The EKG was personally reviewed and demonstrates:  sinus w/ 1st deg AV block, HR 68 Telemetry:  Telemetry was personally reviewed and demonstrates:  sinus with wide complex tachycardia (felt to be afib with abberncy).   Laboratory Data:  Chemistry Recent Labs  Lab 12/05/23 0308 12/05/23 1624 12/06/23 0342  NA 141 142 143  K 4.0 3.8 3.6  CL 96* 95* 96*  CO2 30 29 33*  GLUCOSE 176* 147* 141*  BUN 39* 42* 39*  CREATININE 1.20* 1.04* 0.85  CALCIUM 9.5 9.2 9.2  GFRNONAA 48* 57* >60  ANIONGAP 15 18* 14    Recent Labs  Lab 12/05/23 0308 12/05/23 1624 12/06/23 0342  PROT 7.4 7.0 6.8  ALBUMIN 3.8 3.6 3.4*  AST 3,509* 2,575* 1,706*  ALT 2,153* 2,215* 1,916*  ALKPHOS 73 72 70  BILITOT 0.7 0.6 0.6   Hematology Recent Labs  Lab 12/04/23 0309 12/05/23 0308 12/06/23 1610  WBC 12.1* 15.0* 14.3*  RBC 3.05* 3.10*  3.09* 3.09*  HGB 8.6* 8.8* 8.7*  HCT 27.6* 27.5* 27.5*  MCV 90.5 88.7 89.0  MCH 28.2 28.4 28.2  MCHC 31.2 32.0 31.6  RDW 15.1 15.2 15.4  PLT 395 344 312   Cardiac  EnzymesNo results for input(s): "TROPONINI" in the last 168 hours. No results for input(s): "TROPIPOC" in the last 168 hours.  BNP Recent Labs  Lab 12/02/23 1449 12/04/23 0309 12/05/23 1034  BNP 746.7* 2,749.1* 2,814.3*    DDimer No results for input(s): "DDIMER" in the last 168 hours.  Radiology/Studies:  US ABDOMEN LIMITED WITH LIVER DOPPLER Result Date: 12/05/2023 CLINICAL DATA:  Abnormal LFTs. EXAM: DUPLEX ULTRASOUND OF LIVER TECHNIQUE: Color and duplex Doppler ultrasound was performed to evaluate the hepatic in-flow and out-flow vessels. COMPARISON:  CT abdomen and pelvis 11/30/1998 FINDINGS: Liver: Normal hepatic contour without nodularity. Liver echogenicity is within normal limits. No discrete hepatic lesion. Gallbladder wall thickening measuring 0.3 cm but no significant gallbladder distension. Concern for pericholecystic fluid. Common bile duct measures 0.2 cm. Main Portal Vein size: 1.4 cm Portal Vein Velocities Main Prox:  16 cm/sec Main Mid: 13 cm/sec Main Dist:  21 cm/sec Right: 13 cm/sec Left: 13 cm/sec Hepatic Vein Velocities Right:  54 cm/sec Middle:  52 cm/sec Left:  58 cm/sec IVC: Patent Hepatic Artery Velocity:  94 cm/sec Splenic Vein Velocity: Not visualized Spleen: Not visualized Portal Vein Occlusion/Thrombus: No Splenic Vein Occlusion/Thrombus: No Ascites: Trace perihepatic ascites Varices: None Normal hepatopetal flow in the portal veins. Normal hepatofugal flow in the hepatic veins. Study has technical limitations and the spleen is poorly characterized. IMPRESSION: 1. Portal venous system is patent with normal direction of flow. 2. Concern for gallbladder wall thickening and pericholecystic fluid. In addition, there appears to be trace perihepatic ascites. 3. Study has technical limitations and the spleen is poorly characterized. Electronically Signed   By: Richarda Overlie M.D.   On: 12/05/2023 20:21   [place] EKG SITE RITE Result Date: 12/05/2023 If Site Rite image not attached,  placement could not be confirmed due to current cardiac rhythm.  [place] EKG SITE RITE Result Date: 12/05/2023 If Site Rite image not attached, placement could not be confirmed due to current cardiac rhythm.  DG CHEST PORT 1 VIEW Result Date: 12/05/2023 CLINICAL DATA:  Shortness of breath. EXAM: PORTABLE CHEST 1 VIEW COMPARISON:  12/04/2023. FINDINGS: The heart size and mediastinal contours are unchanged. Aortic atherosclerosis. Persistent bibasilar opacities and small bilateral pleural effusions. Bilateral interstitial prominence, similar to the prior exam concerning for pulmonary edema. No pneumothorax. Dextroscoliotic curvature of the spine. Cervical fixation hardware. No acute osseous abnormality IMPRESSION: 1. Persistent small bilateral pleural effusions and bibasilar opacities. 2. Findings suggestive of pulmonary edema. Electronically Signed   By: Hart Robinsons M.D.   On: 12/05/2023 09:20   DG CHEST PORT 1 VIEW Result Date: 12/04/2023 CLINICAL DATA:  Shortness of breath EXAM: PORTABLE CHEST 1 VIEW COMPARISON:  12/02/2023 FINDINGS: Upper normal heart size. Increasing bibasilar opacities and pleural effusion. Developing interstitial and reticular opacities in both lungs, suspicious for pulmonary edema. No pneumothorax. Scoliotic curvature of the spine. IMPRESSION: Increasing bibasilar opacities and pleural effusions. Developing interstitial and reticular opacities in both lungs, suspicious for pulmonary edema. Electronically Signed   By: Narda Rutherford M.D.   On: 12/04/2023 14:49   DG Chest Port 1 View Result Date: 12/02/2023 CLINICAL DATA:  Shortness of breath. EXAM: PORTABLE CHEST 1 VIEW COMPARISON:  11/30/2023. FINDINGS: Stable cardiomediastinal silhouette.  Emphysematous changes again seen. Similar bibasilar atelectatic changes. No focal consolidation, sizeable effusion, or pneumothorax. Lower cervical fixation hardware. Bilateral breast prosthesis. No acute osseous abnormality. IMPRESSION: Emphysema.   Similar bibasilar atelectatic changes. Electronically Signed   By: Hart Robinsons M.D.   On: 12/02/2023 16:11     STS Risk Calculator:  Procedure Type: Isolated MVR (replacement) Perioperative Outcome Estimate % Operative Mortality 21.2% Morbidity & Mortality 61.1% Stroke 4.12% Renal Failure 14.9% Reoperation 10.3% Prolonged Ventilation 56.1% Deep Sternal Wound Infection 0.067% Long Hospital Stay (>14 days) 44.1% Short Hospital Stay (<6 days)* 3.06%   Procedure Type: Isolated MVr (repair) Perioperative Outcome Estimate % Operative Mortality 23.1% Morbidity & Mortality 51.7% Stroke 4.3% Renal Failure 11.9% Reoperation 8.78% Prolonged Ventilation 43% Deep Sternal Wound Infection 0.068% Long Hospital Stay (>14 days) 44.5% Short Hospital Stay (<6 days)* 4.01%    Assessment and Plan:   [name] is a 73 y.o. female with symptoms of severe, stage D mitral regurgitation with NYHA Class IV symptoms currently readmitted with recurrent heart failure with undifferentiated shock requiring IV milrinone with shock liver although LFTs and lactic acid improving. INR noted to be 3.8 with concern for hepatic dysfunction. PICC line unable to be placed 2/2 bilateral mastectomies. Repeat 2D echo completed today but pending formal read.  I have reviewed the natural history of mitral regurgitation with the patient. We have discussed the limitations of medical therapy and the poor prognosis associated with symptomatic mitral regurgitation. We have also reviewed potential treatment options, including palliative medical therapy, conventional surgical mitral valve repair or replacement, and percutaneous mitral valve repair with MitraClip. We discussed treatment options in the context of this patient's specific comorbid medical conditions.    The patient's predicted risk of mortality with conventional mitral valve replacement/repair is 21.2 % / 23.1 % respectively,  primarily based on age,  malnutrition, anemia, recurrent CHF and shock. Other significant comorbid conditions severe hepatic dysfunction and extremely poor functional status at baseline with weakness and extreme frailty and weakness.  The patient is very non conversant but appears relatively comfortable with mild pursed lip breathing. I had a long conversation with her daughter that we do not feel like mitral valve clipping would be safe or change her overall trajectory. She is in agreement and would like to discuss hospice with the palliative medical team.   She will make herself available to talk to Dr. Excell Seltzer when he sees her tomorrow AM.       Signed, Cline Crock, PA-C  12/06/2023 12:40 PM  Patient seen, examined. Available data reviewed. Agree with findings, assessment, and plan as outlined by Carlean Jews, PA-C.  The patient is independently interviewed and examined.  She is alert, oriented, elderly appearing, in no distress.  HEENT normal, JVP normal, lungs clear bilaterally, heart regular rate and rhythm with no audible murmur, abdomen soft, thin, and nontender with no masses, lower extremities with no edema, skin is warm and dry.  The patient is admitted with NYHA functional class IV symptoms of heart failure complicated by cardiogenic shock requiring IV milrinone.  She is noted to have severe secondary mitral regurgitation with a combination of atrial functional MR and MR related to the posterior leaflet restriction.  This in the setting of her nonischemic cardiomyopathy.  Her clinical course has been complicated by shock liver, atrial fibrillation requiring IV amiodarone now maintaining sinus rhythm, and acute on chronic respiratory failure.  The patient is on 24/7 home O2.  She has been longstanding low body  weight with even further weight loss, currently at 85 pounds (BMI 16.7).  In summary, the patient appears to have taken a downhill course since her ankle fracture in February.  She has too many serious  comorbidities to benefit from mitral valve intervention and the risk/benefit ratio is unfavorable for consideration of even transcatheter therapies such as transcatheter edge-to-edge repair of the mitral valve.  I think her risk of undergoing general anesthesia is high and unlikely to make a significant positive impact on her care.  We have also seen patients with this degree of frailty have very fragile mitral valve leaflets placing them at higher risk of valve trauma and complication.  I have recommended a palliative approach to her care.  Palliative medicine is present for our discussion and there are active conversations about hospice care.  Clinically, the patient seems to be doing modestly better in sinus rhythm on amiodarone.  She remains on low-dose milrinone and we discussed the likely plan moving forward with transitioning her to oral amiodarone and weaning her from milrinone as tolerated.  These decisions will be made by the advanced heart failure team.  We have recommended against structural heart intervention and they understand.  Tonny Bollman, M.D. 12/07/2023 9:35 AM

## 2023-12-06 NOTE — TOC Progression Note (Signed)
 Transition of Care [facility]) - Progression Note    Patient Details  Name: [name] MRN: [id number] Date of Birth: 1951/05/17  Transition of Care [facility]) CM/SW Contact  Nicanor Bake Phone Number: [phone] 12/06/2023, 4:10 PM  Clinical Narrative:   2:37 pm-  HF CSW attempted to meet with patient at bedside. Patient was not in the room at the time. CSW will follow up at a more appropriate time.   TOC will continue following.     Expected Discharge Plan: Skilled Nursing Facility [facility]) Barriers to Discharge: Continued Medical Work up  Expected Discharge Plan and Services       Living arrangements for the past 2 months: Single Family Home                                       Social Determinants of Health (SDOH) Interventions SDOH Screenings   Food Insecurity: Food Insecurity Present (12/03/2023)  Housing: Low Risk  (12/03/2023)  Recent Concern: Housing - High Risk (11/14/2023)  Transportation Needs: No Transportation Needs (12/03/2023)  Utilities: Not At Risk (12/03/2023)  Alcohol Screen: Low Risk  (11/17/2023)  Depression (PHQ2-9): Medium Risk (09/16/2023)  Financial Resource Strain: Low Risk  (11/17/2023)  Physical Activity: Inactive (05/11/2023)  Social Connections: Socially Isolated (12/03/2023)  Stress: No Stress Concern Present (05/11/2023)  Tobacco Use: Medium Risk (11/30/2023)  Health Literacy: Adequate Health Literacy (05/11/2023)    Readmission Risk Interventions     No data to display

## 2023-12-06 NOTE — Consult Note (Addendum)
 ADVANCED HEART FAILURE PROGRESS NOTE  Referring Physician: No ref. provider found  Primary Care: Melida Quitter, PA  CC: Undifferentiated shock  Interval hx:  - Atrial fibrillation with RVR this AM - Very weak on exam.    Current Facility-Administered Medications:    acetaminophen (TYLENOL) tablet 650 mg, 650 mg, Oral, Q6H PRN, Hall, Carole N, DO   albuterol (PROVENTIL) (2.5 MG/3ML) 0.083% nebulizer solution 3 mL, 3 mL, Inhalation, Q6H PRN, Sheikh, Omair Latif, DO, 3 mL at 12/04/23 0217   amiodarone (NEXTERONE) 1.8 mg/mL load via infusion 150 mg, 150 mg, Intravenous, Once **FOLLOWED BY** amiodarone (NEXTERONE PREMIX) 360-4.14 MG/200ML-% (1.8 mg/mL) IV infusion, 60 mg/hr, Intravenous, Continuous **FOLLOWED BY** amiodarone (NEXTERONE PREMIX) 360-4.14 MG/200ML-% (1.8 mg/mL) IV infusion, 30 mg/hr, Intravenous, Continuous, Linzie Criss, DO   apixaban (ELIQUIS) tablet 5 mg, 5 mg, Oral, BID, Hall, Carole N, DO, 5 mg at 12/06/23 0918   budesonide (PULMICORT) nebulizer solution 0.25 mg, 0.25 mg, Nebulization, BID, Sheikh, Omair Latif, DO, 0.25 mg at 12/06/23 6295   ceFEPIme (MAXIPIME) 2 g in sodium chloride 0.9 % 100 mL IVPB, 2 g, Intravenous, Q24H, Sheikh, Omair Latif, DO, Last Rate: 200 mL/hr at 12/05/23 2016, 2 g at 12/05/23 2016   citalopram (CELEXA) tablet 20 mg, 20 mg, Oral, Daily, Hall, Carole N, DO, 20 mg at 12/06/23 0900   [START ON 12/07/2023] digoxin (LANOXIN) tablet 0.125 mg, 0.125 mg, Oral, Daily, Noraa Pickeral, DO   furosemide (LASIX) injection 20 mg, 20 mg, Intravenous, Q12H, Sheikh, Omair Latif, DO, 20 mg at 12/06/23 0900   ipratropium (ATROVENT) nebulizer solution 0.5 mg, 0.5 mg, Nebulization, Q6H, Sheikh, Omair Latif, DO, 0.5 mg at 12/06/23 0905   melatonin tablet 5 mg, 5 mg, Oral, QHS PRN, Dow Adolph N, DO, 5 mg at 12/05/23 2113   midodrine (PROAMATINE) tablet 5 mg, 5 mg, Oral, TID WC, Adyn Serna, DO   milrinone (PRIMACOR) 20 MG/100 ML (0.2 mg/mL) infusion,  0.125 mcg/kg/min, Intravenous, Continuous, Hailly Fess, DO, Last Rate: 1.4 mL/hr at 12/06/23 0920, 0.125 mcg/kg/min at 12/06/23 0920   polyethylene glycol (MIRALAX / GLYCOLAX) packet 17 g, 17 g, Oral, Daily PRN, Margo Aye, Carole N, DO   prochlorperazine (COMPAZINE) injection 5 mg, 5 mg, Intravenous, Q6H PRN, Hall, Carole N, DO, 5 mg at 12/04/23 0213   [START ON 12/07/2023] vancomycin (VANCOREADY) IVPB 500 mg/100 mL, 500 mg, Intravenous, Q36H, Marguerita Merles Latif, DO   PHYSICAL EXAM: Vitals:   12/06/23 0831 12/06/23 0913  BP: 119/83   Pulse: (!) 174   Resp: 20   Temp: 97.7 F (36.5 C)   SpO2: 100% 100%   GENERAL: thin; chronically ill appearing Lungs- decreased at bases CARDIAC:  tachycardic; irregular ABDOMEN: Soft, non-tender, non-distended.  EXTREMITIES: Warm and well perfused.  NEUROLOGIC: No obvious FND   DATA REVIEW  ECG: 12/02/23: NSR  As per my personal interpretation  ECHO: 11/15/23: LVEF 65%, normal RV function. Severe MR As per my personal interpretation  Labs:     Latest Ref Rng & Units 12/05/2023    3:08 AM 12/04/2023    3:09 AM 12/03/2023   10:37 AM  CBC  WBC 4.0 - 10.5 K/uL 15.0  12.1  8.2   Hemoglobin 12.0 - 15.0 g/dL 8.8  8.6  8.2   Hematocrit 36.0 - 46.0 % 27.5  27.6  26.6   Platelets 150 - 400 K/uL 344  395  397        Latest Ref Rng & Units 12/06/2023  3:42 AM 12/05/2023    4:24 PM 12/05/2023    3:08 AM  BMP  Glucose 70 - 99 mg/dL 284  132  440   BUN 8 - 23 mg/dL 39  42  39   Creatinine 0.44 - 1.00 mg/dL 1.02  7.25  3.66   Sodium 135 - 145 mmol/L 143  142  141   Potassium 3.5 - 5.1 mmol/L 3.6  3.8  4.0   Chloride 98 - 111 mmol/L 96  95  96   CO2 22 - 32 mmol/L 33  29  30   Calcium 8.9 - 10.3 mg/dL 9.2  9.2  9.5      ASSESSMENT & PLAN:  Undifferentiated shock - I evaluated patient during her liver ultrasound yesterday.  IVC 2 cm however not collapsible.  Distal extremities warm to touch with no pulse alternans.   - Unable to place PICC line due to  bilateral mastectomies. - Repeat TTE pending; further evaluation pending study.  - decrease milrinone to 0.132mc/kg/min - Patient has underlying severe mitral regurgitation.  I am not sure why her LFTs have risen to the thousands acutely.  She appears to have patent flow to the liver however ultrasound not complete at this time. - Repeat lactic acid down to 2 today; improving LFTs. Will continue to trend.  - Urinalysis leukocyte and nitrite negative. - Started broad spec abx yesterday; CBC pending. CT scan from 11/30/23 with possible pyelonephritis; however, unchanged compared to prior study.  - INR up to 3.8 concerning for hepatic dysfunction. - Repeat CXR pending.   2. Atrial fibrillation with RVR - Hemodynamically stable with HR 150-170 - Start amio gtt with bolus; decrease milrinone to 0.142mcg/kg/min.  - digoxin x 1 followed by daily.   3.  Severe mitral regurgitation - Extremely frail and weak on exam with a BMI of 16.  Patient is unlikely to be a candidate for any advanced therapies including MitraClip. Baseline function status appears to be very poor. I believe she needs to improve her nutritional status, advance with physical therapy before pursuing any kind of interventional therapies.  - Patient & family would benefit from a goals of care discussion prior to any invasive procedures.   4. Frailty -Body mass index is 16.75 kg/m.  5. COPD - CT chest from admission with emphysema; stable on my exam.    Taiwo Fish Advanced Heart Failure Mechanical Circulatory Support

## 2023-12-06 NOTE — Plan of Care (Signed)
  Problem: Education: Goal: Knowledge of General Education information will improve Description: Including pain rating scale, medication(s)/side effects and non-pharmacologic comfort measures Outcome: Progressing   Problem: Elimination: Goal: Will not experience complications related to urinary retention Outcome: Progressing   Problem: Pain Managment: Goal: General experience of comfort will improve and/or be controlled Outcome: Progressing   Problem: Clinical Measurements: Goal: Ability to maintain clinical measurements within normal limits will improve Outcome: Not Progressing Goal: Will remain free from infection Outcome: Not Progressing Goal: Respiratory complications will improve Outcome: Not Progressing Goal: Cardiovascular complication will be avoided Outcome: Not Progressing   Problem: Activity: Goal: Risk for activity intolerance will decrease Outcome: Not Progressing

## 2023-12-06 NOTE — Telephone Encounter (Signed)
 Rollene Rotunda, MD  You13 hours ago (7:30 PM)    I have looked at her hospital records.  She is getting treatment of her heart failure.   The Advanced HF team is seeing her to see if she might eventually be a candidate for the valve procedure.  She needs to have the heart failure treated first.  Patient identification verified by 2 forms. Marilynn Rail, RN    Called and spoke to patients daughter Estevan Ryder provider message  [name] verbalized understanding, no questions at this time

## 2023-12-06 NOTE — Progress Notes (Signed)
   12/06/23 1025  Assess: MEWS Score  Temp 97.7 F (36.5 C)  BP 104/64  MAP (mmHg) 78  ECG Heart Rate (!) 129  Resp (!) 35  Assess: if the MEWS score is Yellow or Red  Were vital signs accurate and taken at a resting state? Yes  Does the patient meet 2 or more of the SIRS criteria? No  Does the patient have a confirmed or suspected source of infection? No  MEWS guidelines implemented  No, previously red, continue vital signs every 4 hours

## 2023-12-06 NOTE — Progress Notes (Signed)
 PROGRESS NOTE    [name]  JYN:[id number] DOB: 03-13-1951 DOA: 12/02/2023 PCP: Melida Quitter, PA   Brief Narrative:  [name] is a 73 y.o. female with medical history significant for COPD on 3 L nasal cannula continuously, nonischemic cardiomyopathy, hyperlipidemia, GERD, recent bimalleolar ankle fracture after fall, paroxysmal A-fib/A-flutter on Eliquis, history of severe MR, moderate pericardial effusion, chronic HFrEF, who presents to the ER via EMS from SNF due to worsening shortness of breath.  Associated with generalized weakness and intermittent palpitations.  No reported chest pain.   In the ER, cardiomegaly with concern for edema on chest x-ray.  BNP elevated greater than 700.  Bilateral lower extremity edema.  The patient received IV Lasix in the ER.  EDP discussed the case with cardiology who recommended medicine admission for further diuresis and management of acute on chronic HFpEF.  Admitted by [facility], hospitalist service.   **Interim History Diuresis was initially held given her low blood pressure so she is been initiated on midodrine and proceed w/ diuresis now that BP improved.  PT OT recommended SNF.  Cardiology following and will get the structural heart team to evaluate. Palliative Care Medicine now evaluating and patient wishes for full scope of care and wants to remain FULL Code.   Labs worsened and her respiratory status continued worsen.  Renal function also worsened and she was hypoperfusing as her lactic acid level continue to trend up.  PCCM and advanced heart failure team were consulted and managing shock with Milrinone, Amio, Digoxin, and LFTs, Renal Fxn, and LA improving.   Assessment and Plan:  Acute on Chronic Diastolic HFpEF / Sever MR : Presented with BNP of 746.7 and worsened to 2,749.1 yesterday and today was 2814.3. Has lower extremity edema, progressive dyspnea. IV diuresing initiated in the ER but held now due to BP. Seen by Cardiology and  getting IV Albumin and Midodrine. Start strict I's and O's and daily weight. Recent echo done on 11/16/2023 revealed LVEF 60 to 65% and no regional wall motion abnormalities.  Resumed Diuretics with IV Lasix 20 mg BID and give an additional 20 mg x1. Per Cardiology she is a procedural candidate for MTEER with posterior restriction and mild aortic dilatation we will due to off axis images.  They are recommending structural heart team to evaluate. Palliative care consultation for goals of care discussion. PT/OT recommending SNF.  Advanced heart failure team consulted and appreciate their help. They repeated LTD echo today and made changes as below. Structural Heart Team consulting. Prognosis is poor and Palliative Consulted and meeting with Daughter along with the AHF Team in the AM  Shock with hypoperfusion resulting in AKI, acute liver injury and lactic acidosis - Patient is hypoperfusing LFTs acutely worsened and started renal function - Advanced heart failure team consulted by cardiology and they are recommending PICC line placement him checking CVP - WBC is worsening so she has been initiated on antibiotics. - Will continue diuresis per cardiology given that she appears volume overloaded - PCCM also consulted for further evaluation; she has not been initiated on broad-spectrum antibiotics and getting panculture -Lactic Acid trending down.   Shock liver/acute liver injury: AST and ALT Trend improving: Recent Labs  Lab 11/15/23 0400 11/30/23 1722 12/02/23 1449 12/04/23 0309 12/05/23 0308 12/05/23 1624 12/06/23 0342  AST 65* 23 18 29  3,509* 2,575* 1,706*  ALT 17 14 12 18  2,153* 2,215* 1,916*  BILITOT 1.9* 0.6 0.3 0.4 0.7 0.6 0.6  - Continue monitor and  trend and repeat CMP in the a.m. and will be obtaining a right upper quadrant ultrasound and acute hepatitis panel which is negative. -Right upper quadrant ultrasound done showed "Portal venous system is patent with normal direction of flow.  Concern for gallbladder wall thickening and pericholecystic fluid. In addition, there appears to be trace perihepatic ascites. Study has technical limitations and the spleen is poorly characterized." -CTM and Trend  AKI: BUN/Cr Trend improved: Recent Labs  Lab 11/30/23 1722 12/02/23 1449 12/03/23 1037 12/04/23 0309 12/05/23 0308 12/05/23 1624 12/06/23 0342  BUN 21 23 24* 23 39* 42* 39*  CREATININE 0.83 0.75 0.85 0.90 1.20* 1.04* 0.85  -Avoid Nephrotoxic Medications, Contrast Dyes, Hypotension and Dehydration to Ensure Adequate Renal Perfusion and will need to Renally Adjust Meds -Repeat UA showed cloudy appearance with amber color urine, small hemoglobin, 100 protein, rare bacteria, 11-20 WBCs.  Urine osmolality was 751, urine sodium 17, urine creatinine was 161 -Continue to Monitor and Trend Renal Function carefully and repeat CMP in the AM   Acute on Chronic Respiratory Failure with Hypoxia / Hx of COPD on 3 L: Worsening change to Xopenex/Atrovent q6h, Brovana and Budesonide. Add Flutter Valve and Incentive Spirometry.  Maintain O2 saturation above 90%. SpO2: 100 % O2 Flow Rate (L/min): 3 L/min. Repeat CXR this AM pending    Hypotension, improved: Held Antihypertensives. C/w Midodrine 10 mg po TID. Given IV Albumin 12.5 grams x1 yesterday Maintain MAP greater than 65. Closely monitor vital signs while on diuretics; on Milrinone 0.125 mcg/kg/min   Persistent A-fib/A-flutter on Eliquis: Resume home Apixaban 5 mg po BID; Rate was controlled but went into RVR overnight. CTM on Telemetry. Given IV Metoprolol 5 mg q2hprn for HR >115. Holding Antihypertensives and beta-blocking and AV nodal blocking agents given Soft BP.  Went into A-fib with RVR and cardiology had initiated IV amiodarone but was discontinued given her shock liver. Given Digoxin 250 mcg x 1 and then 125 mcg po Daily and they are going to decrease milrinone to 0.125 mcg/kg/min and start Amiodarone gtt w/ bolus as LFTs are trending  down   Dysphagia: Having trouble swallowing pills per report. SLP evaluated and now recommending Regular Diet with Thin Liquids but since she is still having issues will have SLP re-evaluate and place back on Soft Diet .   Hypokalemia: K+ is now 3.6. CTM and Replete as Necessary. Repeat CMP in the AM   Hyperlipidemia: Discontinue atorvastatin 40 mg po Daily   Chronic Anxiety/Depression: C/w Citalopram 20 mg po Dally    Normocytic Anemia: Hgb/Hct went from 9.0/28.6 -> 8.2/26.6 -> 8.6/27.6 -> 8.8/27.5 -> 8.7/27.5. Checked Anemia Panel showed an iron level 86, UIBC 278, TIBC 364, saturation ratios of 24%, ferritin level greater than 7500, folate level greater than 40, vitamin B12 level 4049 CTM for S/Sx of Bleeding; No overt bleeding noted. Repeat CBC in the AM   Generalized Weakness and Deconditioning: PT/OT recommending SNF once medically stable. Fall precautions  Hypoalbuminemia: Patient's Albumin Level went from 3.7 -> 3.0 -> 3.3 -> 3.8 -> 3.6 -> 3.4. CTM and Trend and repeat CMP in the AM  Severe Protein Calorie Malnutrition/ Underweight and failure to thrive: Nutrition Status- Nutrition Problem: Severe Malnutrition Etiology: chronic illness (COPD, HFpEF) Signs/Symptoms: moderate muscle depletion, severe fat depletion, severe muscle depletion Interventions: Magic cup, Liberalize Diet, Refer to RD note for recommendations Estimated body mass index is 16.75 kg/m as calculated from the following:   Height as of this encounter: 5' (1.524 m).  Weight as of this encounter: 38.9 kg.   DVT prophylaxis:  apixaban (ELIQUIS) tablet 5 mg    Code Status: Full Code Family Communication: No family present at beside  Disposition Plan:  Level of care: Progressive Status is: Inpatient Remains inpatient appropriate because: Needs further clinical improvement and needs clearance by the specialists.   Consultants:  Palliative care medicine Cardiology Advanced heart failure team PCCM Structural  Heart Team  Procedures:  As delineated as above  Antimicrobials:  Anti-infectives (From admission, onward)    Start     Dose/Rate Route Frequency Ordered Stop   12/07/23 0900  vancomycin (VANCOREADY) IVPB 500 mg/100 mL  Status:  Discontinued        500 mg 100 mL/hr over 60 Minutes Intravenous Every 36 hours 12/06/23 1313 12/06/23 1322   12/07/23 0600  vancomycin (VANCOREADY) IVPB 500 mg/100 mL  Status:  Discontinued        500 mg 100 mL/hr over 60 Minutes Intravenous Every 36 hours 12/05/23 1619 12/06/23 1313   12/06/23 2130  vancomycin (VANCOREADY) IVPB 500 mg/100 mL        500 mg 100 mL/hr over 60 Minutes Intravenous Every 24 hours 12/06/23 1322     12/06/23 1330  ceFEPIme (MAXIPIME) 2 g in sodium chloride 0.9 % 100 mL IVPB        2 g 200 mL/hr over 30 Minutes Intravenous Every 12 hours 12/06/23 1315     12/05/23 1730  vancomycin (VANCOREADY) IVPB 750 mg/150 mL        750 mg 150 mL/hr over 60 Minutes Intravenous  Once 12/05/23 1618 12/05/23 2216   12/05/23 1715  ceFEPIme (MAXIPIME) 2 g in sodium chloride 0.9 % 100 mL IVPB  Status:  Discontinued        2 g 200 mL/hr over 30 Minutes Intravenous Every 24 hours 12/05/23 1616 12/06/23 1315       Subjective: Seen and examined while she is getting her limited ECHO and thinks her SOB is mildly improved. Continues to be very fatigued and feels very weak. HR uncontrolled. No nausea or vomiting currently. No other concerns or complaints at this time.   Objective: Vitals:   12/06/23 2035 12/06/23 2051 12/06/23 2114 12/06/23 2119  BP:  111/79    Pulse:  (!) 102 (!) 145 (!) 103  Resp:  (!) 22 (!) 24 (!) 27  Temp:  97.6 F (36.4 C)    TempSrc:  Oral    SpO2: 98% 100% 100% 100%  Weight:      Height:        Intake/Output Summary (Last 24 hours) at 12/06/2023 2132 Last data filed at 12/06/2023 1647 Gross per 24 hour  Intake 605.28 ml  Output 700 ml  Net -94.72 ml   Filed Weights   12/04/23 0530 12/05/23 0702 12/06/23 0307  Weight:  38 kg 37.2 kg 38.9 kg   Examination: Physical Exam:  Constitutional: Thin frail and extremely cachetic chronically ill-appearing cachetic Caucasian female who appears uncomfortable and in some respiratory distress Respiratory: Diminished to auscultation bilaterally with coarse breath sounds, no wheezing, rales, rhonchi or crackles. Normal respiratory effort and patient is not tachypenic. No accessory muscle use. Wearing supplemental O2 via [place] Cardiovascular: Irregularly Irregular and Tachycardic, no murmurs / rubs / gallops. S1 and S2 auscultated. No extremity edema.   Abdomen: Soft, non-tender, non-distended. Bowel sounds positive.  GU: Deferred. Musculoskeletal: No clubbing / cyanosis of digits/nails. No joint deformity upper and lower extremities.  Skin: No rashes, lesions,  ulcers. No induration; Warm and dry.  Neurologic: CN 2-12 grossly intact with no focal deficits. Romberg sign and cerebellar reflexes not assessed.  Psychiatric: Normal judgment and insight. Alert and oriented x 3. Anxious  Data Reviewed: I have personally reviewed following labs and imaging studies  CBC: Recent Labs  Lab 11/30/23 1722 12/02/23 1449 12/03/23 1037 12/04/23 0309 12/05/23 0308 12/06/23 0342  WBC 12.9* 10.7* 8.2 12.1* 15.0* 14.3*  NEUTROABS 10.3*  --   --  10.3* 13.4* 12.0*  HGB 10.4* 9.0* 8.2* 8.6* 8.8* 8.7*  HCT 32.6* 28.6* 26.6* 27.6* 27.5* 27.5*  MCV 89.3 89.7 89.6 90.5 88.7 89.0  PLT 448* 357 397 395 344 312   Basic Metabolic Panel: Recent Labs  Lab 11/30/23 1722 12/02/23 1449 12/03/23 1037 12/04/23 0309 12/05/23 0308 12/05/23 1624 12/06/23 0342  NA 138   < > 139 140 141 142 143  K 3.1*   < > 3.4* 3.5 4.0 3.8 3.6  CL 94*   < > 95* 96* 96* 95* 96*  CO2 32   < > 33* 31 30 29  33*  GLUCOSE 99   < > 88 279* 176* 147* 141*  BUN 21   < > 24* 23 39* 42* 39*  CREATININE 0.83   < > 0.85 0.90 1.20* 1.04* 0.85  CALCIUM 9.2   < > 9.1 9.2 9.5 9.2 9.2  MG 1.8  --  2.0 2.0 2.0  --  1.9   PHOS  --   --  2.5 4.6 5.1*  --   --    < > = values in this interval not displayed.   GFR: Estimated Creatinine Clearance: 36.2 mL/min (by C-G formula based on SCr of 0.85 mg/dL). Liver Function Tests: Recent Labs  Lab 12/02/23 1449 12/04/23 0309 12/05/23 0308 12/05/23 1624 12/06/23 0342  AST 18 29 3,509* 2,575* 1,706*  ALT 12 18 2,153* 2,215* 1,916*  ALKPHOS 49 56 73 72 70  BILITOT 0.3 0.4 0.7 0.6 0.6  PROT 6.6 6.5 7.4 7.0 6.8  ALBUMIN 3.0* 3.3* 3.8 3.6 3.4*   Recent Labs  Lab 11/30/23 1722  LIPASE 40   No results for input(s): "AMMONIA" in the last 168 hours. Coagulation Profile: Recent Labs  Lab 12/05/23 1034 12/06/23 0342  INR 3.3* 3.8*   Cardiac Enzymes: Recent Labs  Lab 12/05/23 1034  CKTOTAL 208   BNP (last 3 results) No results for input(s): "PROBNP" in the last 8760 hours. HbA1C: No results for input(s): "HGBA1C" in the last 72 hours. CBG: No results for input(s): "GLUCAP" in the last 168 hours. Lipid Profile: No results for input(s): "CHOL", "HDL", "LDLCALC", "TRIG", "CHOLHDL", "LDLDIRECT" in the last 72 hours. Thyroid Function Tests: No results for input(s): "TSH", "T4TOTAL", "FREET4", "T3FREE", "THYROIDAB" in the last 72 hours. Anemia Panel: Recent Labs    12/05/23 0308  VITAMINB12 4,049*  FOLATE >40.0  FERRITIN >7,500*  TIBC 364  IRON 86  RETICCTPCT 1.4   Sepsis Labs: Recent Labs  Lab 12/05/23 1034 12/05/23 1320 12/05/23 1624 12/05/23 1909 12/06/23 0342  PROCALCITON  --   --  1.05  --   --   LATICACIDVEN 4.3* 4.0*  --  4.0* 2.0*   Recent Results (from the past 240 hours)  Resp panel by RT-PCR (RSV, Flu A&B, Covid) Anterior Nasal Swab     Status: None   Collection Time: 12/02/23  2:38 PM   Specimen: Anterior Nasal Swab  Result Value Ref Range Status   SARS Coronavirus 2 by RT PCR  NEGATIVE NEGATIVE Final   Influenza A by PCR NEGATIVE NEGATIVE Final   Influenza B by PCR NEGATIVE NEGATIVE Final    Comment: (NOTE) The Xpert  Xpress SARS-CoV-2/FLU/RSV plus assay is intended as an aid in the diagnosis of influenza from Nasopharyngeal swab specimens and should not be used as a sole basis for treatment. Nasal washings and aspirates are unacceptable for Xpert Xpress SARS-CoV-2/FLU/RSV testing.  Fact Sheet for Patients: BloggerCourse.com  Fact Sheet for Healthcare Providers: SeriousBroker.it  This test is not yet approved or cleared by the [place] FDA and has been authorized for detection and/or diagnosis of SARS-CoV-2 by FDA under an Emergency Use Authorization (EUA). This EUA will remain in effect (meaning this test can be used) for the duration of the COVID-19 declaration under Section 564(b)(1) of the Act, 21 U.S.C. section 360bbb-3(b)(1), unless the authorization is terminated or revoked.     Resp Syncytial Virus by PCR NEGATIVE NEGATIVE Final    Comment: (NOTE) Fact Sheet for Patients: BloggerCourse.com  Fact Sheet for Healthcare Providers: SeriousBroker.it  This test is not yet approved or cleared by the [place] FDA and has been authorized for detection and/or diagnosis of SARS-CoV-2 by FDA under an Emergency Use Authorization (EUA). This EUA will remain in effect (meaning this test can be used) for the duration of the COVID-19 declaration under Section 564(b)(1) of the Act, 21 U.S.C. section 360bbb-3(b)(1), unless the authorization is terminated or revoked.  Performed at [facility], [address] [place], [place] [address]   Culture, blood (Routine X 2) w Reflex to ID Panel     Status: None (Preliminary result)   Collection Time: 12/05/23  4:24 PM   Specimen: BLOOD  Result Value Ref Range Status   Specimen Description BLOOD BLOOD RIGHT ARM  Final   Special Requests   Final    BOTTLES DRAWN AEROBIC ONLY Blood Culture results may not be optimal due to an inadequate volume  of blood received in culture bottles   Culture   Final    NO GROWTH < 24 HOURS Performed at [facility], [address] [place], [place] [address]    Report Status PENDING  Incomplete  Culture, blood (Routine X 2) w Reflex to ID Panel     Status: None (Preliminary result)   Collection Time: 12/05/23  4:25 PM   Specimen: BLOOD  Result Value Ref Range Status   Specimen Description BLOOD BLOOD RIGHT HAND  Final   Special Requests   Final    BOTTLES DRAWN AEROBIC ONLY Blood Culture results may not be optimal due to an inadequate volume of blood received in culture bottles   Culture   Final    NO GROWTH < 24 HOURS Performed at [facility], [address], [place], [place] [address]    Report Status PENDING  Incomplete    Radiology Studies: IR Fluoro Guide CV Line Right Result Date: 12/06/2023 CLINICAL DATA:  Heart failure and need for tunneled central venous catheter placement for IV infusion. EXAM: TUNNELED CENTRAL VENOUS CATHETER PLACEMENT WITH ULTRASOUND AND FLUOROSCOPIC GUIDANCE ANESTHESIA/SEDATION: None MEDICATIONS: None FLUOROSCOPY: 18 seconds.  0.3 mGy. PROCEDURE: The procedure, risks, benefits, and alternatives were explained to the patient. Questions regarding the procedure were encouraged and answered. The patient understands and consents to the procedure. A timeout was performed prior to initiating the procedure. The right neck and chest were prepped with chlorhexidine in a sterile fashion, and a sterile drape was applied covering the  operative field. Maximum barrier sterile technique with sterile gowns and gloves were used for the procedure. Local anesthesia was provided with 1% lidocaine. Ultrasound was used to confirm patency of the right internal jugular vein. An ultrasound image was saved and recorded. After creating a small venotomy incision, a 21 gauge needle was advanced into the right internal jugular vein under direct, real-time ultrasound guidance. Ultrasound image  documentation was performed. After securing guidewire access, a 6 French peel-away sheath was placed. A wire was kinked to measure appropriate catheter length. A 6 French, dual-lumen power line tunneled central venous catheter was chosen for placement. This was tunneled in a retrograde fashion from the chest wall to the venotomy incision. The catheter was cut to 21 cm based on guidewire measurement. The catheter was then placed through the sheath and the sheath removed. Final catheter positioning was confirmed and documented with a fluoroscopic spot image. The catheter was aspirated and flushed with saline. The venotomy incision was closed with subcuticular 4-0 Vicryl. Dermabond was applied to the incision. The catheter exit site was secured with prolene retention sutures. COMPLICATIONS: None.  No pneumothorax. FINDINGS: After catheter placement, the tip lies at the SVC/RA junction. The catheter aspirates normally and is ready for immediate use. IMPRESSION: Placement of tunneled, dual-lumen central venous catheter via the right internal jugular vein. The catheter tip lies at the SVC/RA junction. The catheter is ready for immediate use. Electronically Signed   By: Irish Lack M.D.   On: 12/06/2023 15:36   IR US Guide Vasc Access Right Result Date: 12/06/2023 CLINICAL DATA:  Heart failure and need for tunneled central venous catheter placement for IV infusion. EXAM: TUNNELED CENTRAL VENOUS CATHETER PLACEMENT WITH ULTRASOUND AND FLUOROSCOPIC GUIDANCE ANESTHESIA/SEDATION: None MEDICATIONS: None FLUOROSCOPY: 18 seconds.  0.3 mGy. PROCEDURE: The procedure, risks, benefits, and alternatives were explained to the patient. Questions regarding the procedure were encouraged and answered. The patient understands and consents to the procedure. A timeout was performed prior to initiating the procedure. The right neck and chest were prepped with chlorhexidine in a sterile fashion, and a sterile drape was applied covering the  operative field. Maximum barrier sterile technique with sterile gowns and gloves were used for the procedure. Local anesthesia was provided with 1% lidocaine. Ultrasound was used to confirm patency of the right internal jugular vein. An ultrasound image was saved and recorded. After creating a small venotomy incision, a 21 gauge needle was advanced into the right internal jugular vein under direct, real-time ultrasound guidance. Ultrasound image documentation was performed. After securing guidewire access, a 6 French peel-away sheath was placed. A wire was kinked to measure appropriate catheter length. A 6 French, dual-lumen power line tunneled central venous catheter was chosen for placement. This was tunneled in a retrograde fashion from the chest wall to the venotomy incision. The catheter was cut to 21 cm based on guidewire measurement. The catheter was then placed through the sheath and the sheath removed. Final catheter positioning was confirmed and documented with a fluoroscopic spot image. The catheter was aspirated and flushed with saline. The venotomy incision was closed with subcuticular 4-0 Vicryl. Dermabond was applied to the incision. The catheter exit site was secured with prolene retention sutures. COMPLICATIONS: None.  No pneumothorax. FINDINGS: After catheter placement, the tip lies at the SVC/RA junction. The catheter aspirates normally and is ready for immediate use. IMPRESSION: Placement of tunneled, dual-lumen central venous catheter via the right internal jugular vein. The catheter tip lies at the SVC/RA junction. The  catheter is ready for immediate use. Electronically Signed   By: Irish Lack M.D.   On: 12/06/2023 15:36   ECHOCARDIOGRAM COMPLETE Result Date: 12/06/2023    ECHOCARDIOGRAM REPORT   Patient Name:   [name] Date of Exam: 12/06/2023 Medical Rec #:  [id number]         Height:       60.0 in Accession #:    [id number]        Weight:       85.8 lb Date of Birth:   Jun 13, 1951        BSA:          1.302 m Patient Age:    72 years          BP:           100/75 mmHg Patient Gender: F                 HR:           125 bpm. Exam Location:  Inpatient Procedure: 2D Echo, Cardiac Doppler and Color Doppler (Both Spectral and Color            Flow Doppler were utilized during procedure). Indications:    Cardiomyopathy-unspecified I42.9  History:        Patient has prior history of Echocardiogram examinations, most                 recent 11/16/2023. CHF and Cardiomyopathy,                 Signs/Symptoms:Shortness of Breath; Risk Factors:Dyslipidemia.  Sonographer:    Rosaland Lao Sonographer#2:  Darlys Gales Referring Phys: [id number] ADITYA SABHARWAL IMPRESSIONS  1. Left ventricular ejection fraction, by estimation, is 45 to 50% with beat to beat variability. The left ventricle has mildly decreased function. The left ventricle demonstrates global hypokinesis. There is mild left ventricular hypertrophy. Left ventricular diastolic parameters are indeterminate.  2. Right ventricular systolic function is moderately reduced. The right ventricular size is mildly enlarged. There is mildly elevated pulmonary artery systolic pressure. The estimated right ventricular systolic pressure is 38.2 mmHg.  3. Left atrial size was severely dilated.  4. Right atrial size was severely dilated.  5. The mitral valve is abnormal. Severe mitral valve regurgitation. No evidence of mitral stenosis.  6. Tricuspid valve regurgitation is moderate to severe.  7. The aortic valve is tricuspid. Aortic valve regurgitation is not visualized. No aortic stenosis is present.  8. The inferior vena cava is dilated in size with <50% respiratory variability, suggesting right atrial pressure of 15 mmHg. FINDINGS  Left Ventricle: Left ventricular ejection fraction, by estimation, is 45 to 50%. The left ventricle has mildly decreased function. The left ventricle demonstrates global hypokinesis. The left ventricular internal cavity  size was normal in size. There is  mild left ventricular hypertrophy. Left ventricular diastolic parameters are indeterminate. Right Ventricle: The right ventricular size is mildly enlarged. No increase in right ventricular wall thickness. Right ventricular systolic function is moderately reduced. There is mildly elevated pulmonary artery systolic pressure. The tricuspid regurgitant velocity is 2.41 m/s, and with an assumed right atrial pressure of 15 mmHg, the estimated right ventricular systolic pressure is 38.2 mmHg. Left Atrium: Left atrial size was severely dilated. Right Atrium: Right atrial size was severely dilated. Pericardium: There is no evidence of pericardial effusion. Mitral Valve: The mitral valve is abnormal. Severe mitral valve regurgitation. No evidence of mitral valve stenosis. Tricuspid Valve: The tricuspid valve is  normal in structure. Tricuspid valve regurgitation is moderate to severe. No evidence of tricuspid stenosis. Aortic Valve: The aortic valve is tricuspid. Aortic valve regurgitation is not visualized. No aortic stenosis is present. Pulmonic Valve: The pulmonic valve was normal in structure. Pulmonic valve regurgitation is mild. No evidence of pulmonic stenosis. Aorta: The aortic root is normal in size and structure. Ascending aorta measurements are within normal limits for age when indexed to body surface area. Venous: The inferior vena cava is dilated in size with less than 50% respiratory variability, suggesting right atrial pressure of 15 mmHg. IAS/Shunts: No atrial level shunt detected by color flow Doppler.  LEFT VENTRICLE PLAX 2D LVIDd:         4.10 cm LVIDs:         2.70 cm LV PW:         0.90 cm LV IVS:        1.00 cm LVOT diam:     1.70 cm LVOT Area:     2.27 cm  RIGHT VENTRICLE            IVC RV Basal diam:  3.40 cm    IVC diam: 2.10 cm RV S prime:     9.49 cm/s LEFT ATRIUM           Index        RIGHT ATRIUM           Index LA diam:      4.10 cm 3.15 cm/m   RA Area:      20.40 cm LA Vol (A4C): 60.6 ml 46.53 ml/m  RA Volume:   51.90 ml  39.85 ml/m   AORTA Ao Root diam: 3.70 cm Ao Asc diam:  3.60 cm MR Peak grad: 89.2 mmHg   TRICUSPID VALVE MR Vmax:      472.33 cm/s TR Peak grad:   23.2 mmHg                           TR Vmax:        241.00 cm/s                            SHUNTS                           Systemic Diam: 1.70 cm Weston Brass MD Electronically signed by Weston Brass MD Signature Date/Time: 12/06/2023/3:20:13 PM    Final    US ABDOMEN LIMITED WITH LIVER DOPPLER Result Date: 12/05/2023 CLINICAL DATA:  Abnormal LFTs. EXAM: DUPLEX ULTRASOUND OF LIVER TECHNIQUE: Color and duplex Doppler ultrasound was performed to evaluate the hepatic in-flow and out-flow vessels. COMPARISON:  CT abdomen and pelvis 11/30/1998 FINDINGS: Liver: Normal hepatic contour without nodularity. Liver echogenicity is within normal limits. No discrete hepatic lesion. Gallbladder wall thickening measuring 0.3 cm but no significant gallbladder distension. Concern for pericholecystic fluid. Common bile duct measures 0.2 cm. Main Portal Vein size: 1.4 cm Portal Vein Velocities Main Prox:  16 cm/sec Main Mid: 13 cm/sec Main Dist:  21 cm/sec Right: 13 cm/sec Left: 13 cm/sec Hepatic Vein Velocities Right:  54 cm/sec Middle:  52 cm/sec Left:  58 cm/sec IVC: Patent Hepatic Artery Velocity:  94 cm/sec Splenic Vein Velocity: Not visualized Spleen: Not visualized Portal Vein Occlusion/Thrombus: No Splenic Vein Occlusion/Thrombus: No Ascites: Trace perihepatic ascites Varices: None Normal hepatopetal flow in the portal veins. Normal  hepatofugal flow in the hepatic veins. Study has technical limitations and the spleen is poorly characterized. IMPRESSION: 1. Portal venous system is patent with normal direction of flow. 2. Concern for gallbladder wall thickening and pericholecystic fluid. In addition, there appears to be trace perihepatic ascites. 3. Study has technical limitations and the spleen is poorly  characterized. Electronically Signed   By: Richarda Overlie M.D.   On: 12/05/2023 20:21   [place] EKG SITE RITE Result Date: 12/05/2023 If Site Rite image not attached, placement could not be confirmed due to current cardiac rhythm.  [place] EKG SITE RITE Result Date: 12/05/2023 If Site Rite image not attached, placement could not be confirmed due to current cardiac rhythm.  DG CHEST PORT 1 VIEW Result Date: 12/05/2023 CLINICAL DATA:  Shortness of breath. EXAM: PORTABLE CHEST 1 VIEW COMPARISON:  12/04/2023. FINDINGS: The heart size and mediastinal contours are unchanged. Aortic atherosclerosis. Persistent bibasilar opacities and small bilateral pleural effusions. Bilateral interstitial prominence, similar to the prior exam concerning for pulmonary edema. No pneumothorax. Dextroscoliotic curvature of the spine. Cervical fixation hardware. No acute osseous abnormality IMPRESSION: 1. Persistent small bilateral pleural effusions and bibasilar opacities. 2. Findings suggestive of pulmonary edema. Electronically Signed   By: Hart Robinsons M.D.   On: 12/05/2023 09:20   Scheduled Meds:  apixaban  5 mg Oral BID   budesonide (PULMICORT) nebulizer solution  0.25 mg Nebulization BID   citalopram  20 mg Oral Daily   [START ON 12/07/2023] digoxin  0.125 mg Oral Daily   furosemide  20 mg Intravenous Q12H   midodrine  5 mg Oral TID WC   Continuous Infusions:  amiodarone 30 mg/hr (12/06/23 2108)   ceFEPime (MAXIPIME) IV 2 g (12/06/23 1612)   milrinone 0.125 mcg/kg/min (12/06/23 0920)   vancomycin 500 mg (12/06/23 2118)    LOS: 4 days   Marguerita Merles, DO [facility] Available via Epic secure chat 7am-7pm After these hours, please refer to coverage provider listed on [email or web address] 12/06/2023, 9:32 PM

## 2023-12-06 NOTE — Procedures (Signed)
 Interventional Radiology Procedure Note  Procedure: Tunneled CVC placement  Complications: None  Estimated Blood Loss: < 10 mL  Findings: Right internal jugular vein tunneled, 6 Fr, DL 21 cm Power Line placed with tip at SVC/RA junction. OK to use.  Jodi Marble. Fredia Sorrow, M.D Pager:  [phone]

## 2023-12-06 NOTE — Progress Notes (Signed)
  Echocardiogram 2D Echocardiogram has been performed.  [name] 12/06/2023, 11:22 AM

## 2023-12-06 NOTE — Progress Notes (Signed)
 Daily Progress Note   Patient Name: [name]       Date: 12/06/2023 DOB: November 24, 1950  Age: 73 y.o. MRN#: [id number] Attending Physician: Merlene Laughter, DO Primary Care Physician: Melida Quitter, PA Admit Date: 12/02/2023 Length of Stay: 4 days  Reason for Consultation/Follow-up: Establishing goals of care  HPI/Patient Profile:  [name] is a 73 y.o. female with multiple medical problems including COPD on 3 L O2, nonischemic cardiomyopathy after breast cancer treatment, diastolic dysfunction with EF of 55%, paroxysmal A-fib on Eliquis, severe MR being evaluated for MitraClip.  Patient recently hospitalized 11/14/2023 to 11/18/2023 with CHF.  Patient seen by interventional cardiology with plan for outpatient evaluation for MitraClip.  Patient readmitted 12/02/2023 with acute on chronic diastolic CHF.  Patient initially hypotensive and unable to tolerate diuretics.  She was started on midodrine and albumin.  Palliative care consulted to address goals.   Subjective:   Subjective: Chart Reviewed. Updates received. Patient Assessed. Created space and opportunity for patient  and family to explore thoughts and feelings regarding current medical situation.  Today's Discussion: 14 the patient I reviewed the chart over the past several days extensively.  The patient had an episode of A-fib/RVR overnight.  Overall LFTs and lactic acid is improved but she remains with severe mitral regurg.  She is very frail and weak with a BMI of only 16.  Unlikely to be a candidate for advanced therapies including MitraClip, but planned evaluation today.  Overall her baseline is poor.    Today saw the patient at the bedside, no family was present.  The patient appears quite frail and weak.  Only whispers and nods her head, not very conversational.  She denies pain, agrees that I can call and speak with her daughter.  I asked her if the heart team saw her today and she nodded yes.  I asked what they told  her and she shrugged and said she is not sure.  Better today I called and spoke with the patient's daughter.  She states that she spoke with the PA from the structural team who said that she has a poor outlook, not a candidate for MitraClip.  But then just an hour ago she spoke with the heart failure nurse practitioner who she got the impression that there is a better outlook.  However, the heart failure NP did say that she would not want a ventilator, etc.  As of yet unable to corroborate this.  However, her daughter would not be opposed to DNR.  Daughter's biggest questions and concerns are if she is not a candidate for this procedure then what are the neck steps.  There was a mention of hospice care, but after speaking with heart failure NP this afternoon she did not get that impression from them.  We agreed that we need more clarity and to be on the same page so we can decide what options are available to her in order to discuss what these would look like for her mother and make the best decision for how to take care of her.  Daughter knows that she cannot live at the hospital, cannot be on IVs forever.  She states that she was told that somebody from the heart team would meet with her tomorrow at 9:00.  I asked if it would be okay if I was there as well and she agreed.  I provided emotional and general support through therapeutic listening, empathy, sharing of stories, and other techniques. I  answered all questions and addressed all concerns to the best of my ability.  ROS Limited due to frailty Review of Systems  Constitutional:        Denies pain in general  Respiratory:  Positive for shortness of breath.   Cardiovascular:  Negative for chest pain.    Objective:   Vital Signs:  BP 96/69   Pulse (!) 174   Temp 97.7 F (36.5 C)   Resp (!) 33   Ht 5' (1.524 m)   Wt 38.9 kg   SpO2 100%   BMI 16.75 kg/m   Physical Exam Vitals and nursing note reviewed.  Constitutional:       General: She is sleeping. She is not in acute distress.    Comments: Aroused from sleep after multiple times calling her name and tapping her hand  Cardiovascular:     Rate and Rhythm: Normal rate.  Pulmonary:     Effort: Pulmonary effort is normal. No respiratory distress.     Comments: Pursed lip breathing Abdominal:     General: Abdomen is flat. There is no distension.     Palpations: Abdomen is soft.     Tenderness: There is no abdominal tenderness.  Skin:    General: Skin is warm and dry.  Psychiatric:        Mood and Affect: Mood normal.        Behavior: Behavior normal.     Palliative Assessment/Data: 20-30%    Existing Vynca/ACP Documentation: None  Assessment & Plan:   Impression: Present on Admission:  Acute on chronic diastolic CHF (congestive heart failure) (HCC)  SUMMARY OF RECOMMENDATIONS   Full code Full scope of care Will re-ask the patient about CODE STATUS tomorrow Plan to join family meeting tomorrow at 9:00 in the morning We will try to confirm available options for treatment to help family in deciding how to proceed Palliative medicine will continue to follow  Symptom Management:  Per primary team PMT is available to assist as needed  Code Status: Full code  Prognosis: Unable to determine  Discharge Planning: To Be Determined  Discussed with: Patient, family, medical team, nursing team  Thank you for allowing [place] to participate in the care of [name] PMT will continue to support holistically.  Time Total: 50 min  Detailed review of medical records (labs, imaging, vital signs), medically appropriate exam, discussed with treatment team, counseling and education to patient, family, & staff, documenting clinical information, medication management, coordination of care  Wynne Dust, NP Palliative Medicine Team  Team Phone # [phone] (Nights/Weekends)  04/29/2021, 8:17 AM

## 2023-12-06 NOTE — Progress Notes (Signed)
 Dr. Remus Loffler said she might reduce the dose of the milrinone. Still waiting  for a rate change. Will continue to monitor,

## 2023-12-06 NOTE — Progress Notes (Signed)
 Dr. Remus Loffler called back trying to talk to Dr. Antionette Char to collaborate what to do next with the patient's HR

## 2023-12-06 NOTE — Consult Note (Signed)
 Overnight pt with elevated HR to 160s. Wide complex, likely AF/RVR with abberancy. Would be slow for VT though does look regular at times. Given sustained with no LOC most c/w AF.   On review of labs concerning for shock liver with AST/ALT> 1000, INR 3.3 no warfarin. Not encephalopathic. Day team notes indicated starting milrinone for empiric therapy for ADHF. Spoke with primary team, given normotensive and asx despite AF with RVR requested lactate to see if milrinone helping. Lactate down to 2. Will keep milrinone dose where it is for the moment and make HF team aware in AM. No amio given liver function. No BB given shock.    Abx decision as per primary team given notably procal > 1.   Babette Relic, MD Msc  Cardiology

## 2023-12-07 ENCOUNTER — Ambulatory Visit: Admitting: General Practice

## 2023-12-07 ENCOUNTER — Inpatient Hospital Stay (HOSPITAL_COMMUNITY)

## 2023-12-07 DIAGNOSIS — I5033 Acute on chronic diastolic (congestive) heart failure: Secondary | ICD-10-CM | POA: Diagnosis not present

## 2023-12-07 DIAGNOSIS — Z515 Encounter for palliative care: Secondary | ICD-10-CM | POA: Diagnosis not present

## 2023-12-07 DIAGNOSIS — I480 Paroxysmal atrial fibrillation: Secondary | ICD-10-CM | POA: Diagnosis not present

## 2023-12-07 DIAGNOSIS — I3139 Other pericardial effusion (noninflammatory): Secondary | ICD-10-CM | POA: Diagnosis not present

## 2023-12-07 DIAGNOSIS — Z66 Do not resuscitate: Secondary | ICD-10-CM | POA: Diagnosis not present

## 2023-12-07 DIAGNOSIS — I340 Nonrheumatic mitral (valve) insufficiency: Secondary | ICD-10-CM | POA: Diagnosis not present

## 2023-12-07 DIAGNOSIS — Z789 Other specified health status: Secondary | ICD-10-CM

## 2023-12-07 DIAGNOSIS — Z7189 Other specified counseling: Secondary | ICD-10-CM | POA: Diagnosis not present

## 2023-12-07 LAB — COMPREHENSIVE METABOLIC PANEL WITH GFR
ALT: 1358 U/L — ABNORMAL HIGH (ref 0–44)
AST: 729 U/L — ABNORMAL HIGH (ref 15–41)
Albumin: 3 g/dL — ABNORMAL LOW (ref 3.5–5.0)
Alkaline Phosphatase: 66 U/L (ref 38–126)
Anion gap: 13 (ref 5–15)
BUN: 33 mg/dL — ABNORMAL HIGH (ref 8–23)
CO2: 38 mmol/L — ABNORMAL HIGH (ref 22–32)
Calcium: 8.8 mg/dL — ABNORMAL LOW (ref 8.9–10.3)
Chloride: 91 mmol/L — ABNORMAL LOW (ref 98–111)
Creatinine, Ser: 0.75 mg/dL (ref 0.44–1.00)
GFR, Estimated: >60 mL/min (ref >60)
Glucose, Bld: 117 mg/dL — ABNORMAL HIGH (ref 70–99)
Potassium: 2.5 mmol/L — CL (ref 3.5–5.1)
Sodium: 142 mmol/L (ref 135–145)
Total Bilirubin: 1 mg/dL (ref 0.0–1.2)
Total Protein: 6.3 g/dL — ABNORMAL LOW (ref 6.5–8.1)

## 2023-12-07 LAB — BASIC METABOLIC PANEL WITH GFR
Anion gap: 12 (ref 5–15)
Anion gap: 10 (ref 5–15)
BUN: 30 mg/dL — ABNORMAL HIGH (ref 8–23)
BUN: 31 mg/dL — ABNORMAL HIGH (ref 8–23)
CO2: 36 mmol/L — ABNORMAL HIGH (ref 22–32)
CO2: 37 mmol/L — ABNORMAL HIGH (ref 22–32)
Calcium: 8.6 mg/dL — ABNORMAL LOW (ref 8.9–10.3)
Calcium: 8.8 mg/dL — ABNORMAL LOW (ref 8.9–10.3)
Chloride: 90 mmol/L — ABNORMAL LOW (ref 98–111)
Chloride: 95 mmol/L — ABNORMAL LOW (ref 98–111)
Creatinine, Ser: 0.83 mg/dL (ref 0.44–1.00)
Creatinine, Ser: 0.82 mg/dL (ref 0.44–1.00)
GFR, Estimated: >60 mL/min (ref >60)
GFR, Estimated: >60 mL/min (ref >60)
Glucose, Bld: 284 mg/dL — ABNORMAL HIGH (ref 70–99)
Glucose, Bld: 116 mg/dL — ABNORMAL HIGH (ref 70–99)
Potassium: 7.1 mmol/L (ref 3.5–5.1)
Potassium: 4.1 mmol/L (ref 3.5–5.1)
Sodium: 138 mmol/L (ref 135–145)
Sodium: 142 mmol/L (ref 135–145)

## 2023-12-07 LAB — CBC WITH DIFFERENTIAL/PLATELET
Abs Immature Granulocytes: 0.08 10*3/uL — ABNORMAL HIGH (ref 0.00–0.07)
Basophils Absolute: 0 10*3/uL (ref 0.0–0.1)
Basophils Relative: 0 %
Eosinophils Absolute: 0.1 10*3/uL (ref 0.0–0.5)
Eosinophils Relative: 1 %
HCT: 28.5 % — ABNORMAL LOW (ref 36.0–46.0)
Hemoglobin: 9.1 g/dL — ABNORMAL LOW (ref 12.0–15.0)
Immature Granulocytes: 1 %
Lymphocytes Relative: 7 %
Lymphs Abs: 0.6 10*3/uL — ABNORMAL LOW (ref 0.7–4.0)
MCH: 28.3 pg (ref 26.0–34.0)
MCHC: 31.9 g/dL (ref 30.0–36.0)
MCV: 88.8 fL (ref 80.0–100.0)
Monocytes Absolute: 0.5 10*3/uL (ref 0.1–1.0)
Monocytes Relative: 7 %
Neutro Abs: 6.7 10*3/uL (ref 1.7–7.7)
Neutrophils Relative %: 84 %
Platelets: 315 10*3/uL (ref 150–400)
RBC: 3.21 MIL/uL — ABNORMAL LOW (ref 3.87–5.11)
RDW: 15.5 % (ref 11.5–15.5)
WBC: 7.9 10*3/uL (ref 4.0–10.5)
nRBC: 0.8 % — ABNORMAL HIGH (ref 0.0–0.2)

## 2023-12-07 LAB — COOXEMETRY PANEL
Carboxyhemoglobin: 1.2 % (ref 0.5–1.5)
Methemoglobin: <0.7 % (ref 0.0–1.5)
O2 Saturation: 57.1 %
Total hemoglobin: 9.1 g/dL — ABNORMAL LOW (ref 12.0–16.0)

## 2023-12-07 LAB — LACTIC ACID, PLASMA
Lactic Acid, Venous: 1.6 mmol/L (ref 0.5–1.9)
Lactic Acid, Venous: 1.2 mmol/L (ref 0.5–1.9)

## 2023-12-07 LAB — MAGNESIUM: Magnesium: 1.8 mg/dL (ref 1.7–2.4)

## 2023-12-07 LAB — PHOSPHORUS: Phosphorus: 2 mg/dL — ABNORMAL LOW (ref 2.5–4.6)

## 2023-12-07 MED ORDER — POTASSIUM PHOSPHATES 15 MMOLE/5ML IV SOLN
15.0000 mmol | Freq: Once | INTRAVENOUS | Status: AC
  Administered 2023-12-07: 15 mmol via INTRAVENOUS
  Filled 2023-12-07: qty 5

## 2023-12-07 MED ORDER — POTASSIUM CHLORIDE 10 MEQ/100ML IV SOLN
10.0000 meq | INTRAVENOUS | Status: AC
  Administered 2023-12-07 (×3): 10 meq via INTRAVENOUS
  Filled 2023-12-07 (×3): qty 100

## 2023-12-07 MED ORDER — POTASSIUM CHLORIDE CRYS ER 20 MEQ PO TBCR
40.0000 meq | EXTENDED_RELEASE_TABLET | ORAL | Status: AC
  Administered 2023-12-07 (×2): 40 meq via ORAL
  Filled 2023-12-07 (×2): qty 2

## 2023-12-07 MED ORDER — MAGNESIUM SULFATE IN D5W 1-5 GM/100ML-% IV SOLN
1.0000 g | Freq: Once | INTRAVENOUS | Status: AC
  Administered 2023-12-07: 1 g via INTRAVENOUS
  Filled 2023-12-07: qty 100

## 2023-12-07 MED ORDER — AMIODARONE HCL IN DEXTROSE 360-4.14 MG/200ML-% IV SOLN
60.0000 mg/h | INTRAVENOUS | Status: AC
  Administered 2023-12-07: 60 mg/h via INTRAVENOUS
  Filled 2023-12-07: qty 200

## 2023-12-07 MED ORDER — AMIODARONE HCL 200 MG PO TABS
200.0000 mg | ORAL_TABLET | Freq: Every day | ORAL | Status: DC
  Administered 2023-12-07: 200 mg via ORAL
  Filled 2023-12-07: qty 1

## 2023-12-07 NOTE — Plan of Care (Signed)
  Problem: Education: Goal: Knowledge of General Education information will improve Description: Including pain rating scale, medication(s)/side effects and non-pharmacologic comfort measures Outcome: Progressing   Problem: Elimination: Goal: Will not experience complications related to urinary retention Outcome: Progressing   Problem: Pain Managment: Goal: General experience of comfort will improve and/or be controlled Outcome: Progressing   Problem: Clinical Measurements: Goal: Ability to maintain clinical measurements within normal limits will improve Outcome: Not Progressing Goal: Will remain free from infection Outcome: Not Progressing Goal: Diagnostic test results will improve Outcome: Not Progressing Goal: Respiratory complications will improve Outcome: Not Progressing Goal: Cardiovascular complication will be avoided Outcome: Not Progressing   Problem: Activity: Goal: Risk for activity intolerance will decrease Outcome: Not Progressing

## 2023-12-07 NOTE — Care Management Important Message (Signed)
 Important Message  Patient Details  Name: [name] MRN: [id number] Date of Birth: 03/24/1951   Important Message Given:  Yes - Medicare IM     Renie Ora 12/07/2023, 10:10 AM

## 2023-12-07 NOTE — Progress Notes (Signed)
 CCMD just notified me that the patient had 9 beats of VTs at 8:40 pm, 11 beats at 9:27 pm and 5 beats now. I just checked on her and she's asymptomatic for that. Notified Dr. Regino Schultze and awaiting for response. Will continue to monitor.

## 2023-12-07 NOTE — TOC Progression Note (Signed)
 Transition of Care [facility]) - Progression Note    Patient Details  Name: [name] MRN: [id number] Date of Birth: July 27, 1951  [facility] CM/SW Contact  Nicanor Bake Phone Number: [phone] 12/07/2023, 10:03 AM  Clinical Narrative:  9:59 AM- HF CSW called the patients daughter, [name]. [name] stated that she will be out of the [facility] Friday, April 12 until Sunday, April 20th but will be available by phone. Heather requested that the patients sister be contacted with updated if she cannot be reached. Patients daughter stated that they would like Psychologist, sport and exercise and [facility] services.   TOC will continue following.     Expected Discharge Plan: Skilled Nursing Facility [facility] SNF) Barriers to Discharge: Continued Medical Work up  Expected Discharge Plan and Services       Living arrangements for the past 2 months: Single Family Home                                       Social Determinants of Health (SDOH) Interventions SDOH Screenings   Food Insecurity: Food Insecurity Present (12/03/2023)  Housing: Low Risk  (12/03/2023)  Recent Concern: Housing - High Risk (11/14/2023)  Transportation Needs: No Transportation Needs (12/03/2023)  Utilities: Not At Risk (12/03/2023)  Alcohol Screen: Low Risk  (11/17/2023)  Depression (PHQ2-9): Medium Risk (09/16/2023)  Financial Resource Strain: Low Risk  (11/17/2023)  Physical Activity: Inactive (05/11/2023)  Social Connections: Socially Isolated (12/03/2023)  Stress: No Stress Concern Present (05/11/2023)  Tobacco Use: Medium Risk (11/30/2023)  Health Literacy: Adequate Health Literacy (05/11/2023)    Readmission Risk Interventions     No data to display

## 2023-12-07 NOTE — Plan of Care (Signed)
  Problem: Clinical Measurements: Goal: Diagnostic test results will improve Outcome: Progressing   Problem: Clinical Measurements: Goal: Respiratory complications will improve Outcome: Progressing   Problem: Clinical Measurements: Goal: Cardiovascular complication will be avoided Outcome: Progressing   Problem: Nutrition: Goal: Adequate nutrition will be maintained Outcome: Progressing   Problem: Safety: Goal: Ability to remain free from injury will improve Outcome: Progressing   Problem: Skin Integrity: Goal: Risk for impaired skin integrity will decrease Outcome: Progressing

## 2023-12-07 NOTE — Progress Notes (Signed)
 Patient remained the same, has poor oral intake, didn't eat any meal, refused for mobility. Was on Amio and Milrinone drip, stopped as per the order.  Amio restarted as she converted to Afib , HR went up to 120s, amio running at 60 mg/hr, plan to continue the same rate as per the cardiac team.

## 2023-12-07 NOTE — Progress Notes (Signed)
 PT Cancellation Note  Patient Details Name: [name] MRN: [id number] DOB: [birth year]   Cancelled Treatment:    Reason Eval/Treat Not Completed: (P) Fatigue/lethargy limiting ability to participate, pt transferring to inpt hospice facility. PT will sign off at this time, please re-consult if appropriate.   Lenora Boys. PTA Acute Rehabilitation Services Office: [phone]    Catalina Antigua 12/07/2023, 2:51 PM

## 2023-12-07 NOTE — Progress Notes (Addendum)
 Informed to the unit pharmacist about critical potassium 7.1, pt is receiving potassium phosphate infusion, the pharmacist said, she will notify HF team, plan is to check her potassium after the bag is finished as per the pharmacist.   Informed to the attending MD, will check her lab again from peripheral specimen. Lab informed.  Potassium rechecked 4.1, MD and pharmacist informed.

## 2023-12-07 NOTE — Progress Notes (Signed)
 Critical result potassium 2.5   12/07/23 0505  Provider Notification  Provider Name/Title Dr. Antionette Char  Date Provider Notified 12/07/23  Time Provider Notified 0505  Method of Notification Page (secure chat)  Notification Reason Critical Result  Test performed and critical result Potassium 2.5  Date Critical Result Received 12/07/23  Time Critical Result Received 0502  Provider response See new orders  Date of Provider Response 12/07/23  Time of Provider Response [phone]

## 2023-12-07 NOTE — Progress Notes (Signed)
 Mobility Specialist Progress Note:   12/07/23 1142  Mobility  Activity Refused mobility   Pt politely declined mobility. Pt received in bed, looking lethargic. Left pt in bed with all needs met, call bell in reach. RN notified.   Feliciana Rossetti Mobility Specialist Please contact via Special educational needs teacher or  Rehab office at [phone]

## 2023-12-07 NOTE — Progress Notes (Addendum)
 PROGRESS NOTE    [name]  MWU:[id number] DOB: March 03, 1951 DOA: 12/02/2023 PCP: Melida Quitter, PA   Brief Narrative:  [name] is a 73 y.o. female with medical history significant for COPD on 3 L nasal cannula continuously, nonischemic cardiomyopathy, hyperlipidemia, GERD, recent bimalleolar ankle fracture after fall, paroxysmal A-fib/A-flutter on Eliquis, history of severe MR, moderate pericardial effusion, chronic HFrEF, who presents to the ER via EMS from SNF due to worsening shortness of breath.  Associated with generalized weakness and intermittent palpitations.  No reported chest pain.   In the ER, cardiomegaly with concern for edema on chest x-ray.  BNP elevated greater than 700.  Bilateral lower extremity edema.  The patient received IV Lasix in the ER.  EDP discussed the case with cardiology who recommended medicine admission for further diuresis and management of acute on chronic HFpEF.  Admitted by [facility], hospitalist service.   **Interim History Diuresis was initially held given her low blood pressure midodrine was initiated in order to proceed with diuresis.  PT OT recommended SNF.  General Cardiology consulted and then consulted Structural Cardio team and she is not a candidate for MitraClip at this time.  Labs worsened and her respiratory status continued worsen.  Renal function also worsened and she was hypoperfusing as her lactic acid level continue to trend up and felt to be in shock. PCCM and advanced heart failure team were consulted and were managing managing shock with Milrinone, Amio, Digoxin.  Subsequently LFTs, Renal Fxn, and LA improving she intermittently goes back and forth between A-fib and RVR.  Further goals of care discussion being held and we are waiting for further outcomes and family would like to speak with hospice and family is likely going to consider residential hospice placement.   Assessment and Plan:  Acute on Chronic Diastolic HFpEF /  Sever MR : Presented with BNP of 746.7 and worsened to 2,749.1 -> 2814.3. Has lower extremity edema, progressive dyspnea. IV diuresing initiated in the ER but held now due to BP. Seen by Cardiology and getting IV Albumin and Midodrine. Start strict I's and O's and daily weight. Recent echo done on 11/16/2023 revealed LVEF 60 to 65% and no regional wall motion abnormalities.  Resumed Diuretics with IV Lasix 20 mg BID and give not a candidate for MitraClip per the structural heart team.   -Advanced heart failure team consulted and appreciate their help. They repeated LTD echo yesterday and made changes as below.  -Prognosis is poor and Palliative Consulted and met with Daughter along with the AHF Team and hospice has been discussed and the family would like to speak with hospice heart failure which she has.  Shock with hypoperfusion resulting in AKI, acute liver injury and lactic acidosis - Patient is hypoperfusing LFTs acutely worsened and started renal function - Advanced heart failure team consulted by cardiology and they are recommending PICC line placement him checking CVP - WBC is worsening so she has been initiated on antibiotics. - Will continue diuresis per cardiology given that she appears volume overloaded - PCCM also consulted for further evaluation; she has not been initiated on broad-spectrum antibiotics and getting panculture -Lactic Acid trending down.  -GOC Discussions being had and Patient and Family would like to speak with Hospice and Hospice to meet with the Patient and family are leaning toward [facility], [facility] hospice services  Shock liver/acute liver injury: AST and ALT Trend improving: Recent Labs  Lab 11/30/23 1722 12/02/23 1449 12/04/23 0309 12/05/23 0308  12/05/23 1624 12/06/23 0342 12/07/23 0420  AST 23 18 29  3,509* 2,575* 1,706* 729*  ALT 14 12 18  2,153* 2,215* 1,916* 1,358*  BILITOT 0.6 0.3 0.4 0.7 0.6 0.6 1.0  -CTM and repeat CMP in the a.m. and will be  obtaining a right upper quadrant ultrasound and acute hepatitis panel which is negative. -Right upper quadrant ultrasound done showed "Portal venous system is patent with normal direction of flow. Concern for gallbladder wall thickening and pericholecystic fluid. In addition, there appears to be trace perihepatic ascites. Study has technical limitations and the spleen is poorly characterized." -CTM and Trend and thankfully improving   AKI: BUN/Cr Trend improved and is now 31/0.82. Avoid Nephrotoxic Medications, Contrast Dyes, Hypotension and Dehydration to Ensure Adequate Renal Perfusion and will need to Renally Adjust Meds -Repeat UA showed cloudy appearance with amber color urine, small hemoglobin, 100 protein, rare bacteria, 11-20 WBCs.  Urine osmolality was 751, urine sodium 17, urine creatinine was 161 -CTM and Trend Renal Function carefully and repeat CMP in the AM   Acute on Chronic Respiratory Failure with Hypoxia / Hx of COPD on 3 L: Worsening change to Xopenex/Atrovent q6h, Brovana and Budesonide. Add Flutter Valve and Incentive Spirometry.  Maintain O2 saturation above 90%. SpO2: 100 % O2 Flow Rate (L/min): 3 L/min. Repeat CXR this AM pending    Hypotension, improved: Held Antihypertensives. C/w Midodrine 10 mg po TID. Given IV Albumin 12.5 grams x1 yesterday Maintain MAP greater than 65. Closely monitor vital signs while on diuretics; Weaned off Milrinone 0.125 mcg/kg/min today   Persistent A-fib/A-flutter on Eliquis: Resumed home Apixaban 5 mg po BID; Rate was controlled but went into RVR overnight. CTM on Telemetry. Given IV Metoprolol 5 mg q2hprn for HR >115. Holding Antihypertensives and beta-blocking and AV nodal blocking agents given Soft BP. IV Amio is being weaned and she is going to transition to orals tomorrow.  It actually had stopped but she converted back to A-fib and heart rates were in the 120s so she resumed on amiodarone drip at 60 mg/h  Dysphagia: Had trouble swallowing  pills per report. SLP evaluated and now recommending Regular Diet with Thin Liquids but since she is still having issues will have SLP re-evaluate and place back on Soft Diet .   Hypokalemia: K+ is now 4.1. CTM and Replete as Necessary. Repeat CMP in the AM   Hyperlipidemia: Discontinue Atorvastatin 40 mg po Daily   Chronic Anxiety/Depression: C/w Citalopram 20 mg po Dally    Normocytic Anemia: Hgb/Hct relatively stable and is now 9.1/28.5. Checked Anemia Panel showed an iron level 86, UIBC 278, TIBC 364, saturation ratios of 24%, ferritin level greater than 7500, folate level greater than 40, vitamin B12 level 4049 CTM for S/Sx of Bleeding; No overt bleeding noted. Repeat CBC in the AM   Generalized Weakness and Deconditioning: PT/OT recommending SNF once medically stable. Fall precautions  Hypoalbuminemia: Patient's Albumin Level now 3.0. CTM and Trend and repeat CMP in the AM  Severe Protein Calorie Malnutrition/ Underweight and failure to thrive: Nutrition Status- Nutrition Problem: Severe Malnutrition Etiology: chronic illness (COPD, HFpEF) Signs/Symptoms: moderate muscle depletion, severe fat depletion, severe muscle depletion Interventions: Magic cup, Liberalize Diet, Refer to RD note for recommendations Estimated body mass index is 16.71 kg/m as calculated from the following:   Height as of this encounter: 5' (1.524 m).   Weight as of this encounter: 38.8 kg.   DVT prophylaxis:  apixaban (ELIQUIS) tablet 5 mg    Code Status: Limited:  Do not attempt resuscitation (DNR) -DNR-LIMITED -Do Not Intubate/DNI  Family Communication: D/w Daughter at bedside   Disposition Plan:  Level of care: Progressive Status is: Inpatient Remains inpatient appropriate because: Needs further clinical improvement   Consultants:  Palliative care medicine Cardiology Advanced heart failure team PCCM Structural Heart Team  Procedures:  As delineated as above  Antimicrobials:  Anti-infectives  (From admission, onward)    Start     Dose/Rate Route Frequency Ordered Stop   12/07/23 0900  vancomycin (VANCOREADY) IVPB 500 mg/100 mL  Status:  Discontinued        500 mg 100 mL/hr over 60 Minutes Intravenous Every 36 hours 12/06/23 1313 12/06/23 1322   12/07/23 0600  vancomycin (VANCOREADY) IVPB 500 mg/100 mL  Status:  Discontinued        500 mg 100 mL/hr over 60 Minutes Intravenous Every 36 hours 12/05/23 1619 12/06/23 1313   12/06/23 2130  vancomycin (VANCOREADY) IVPB 500 mg/100 mL        500 mg 100 mL/hr over 60 Minutes Intravenous Every 24 hours 12/06/23 1322     12/06/23 1330  ceFEPIme (MAXIPIME) 2 g in sodium chloride 0.9 % 100 mL IVPB        2 g 200 mL/hr over 30 Minutes Intravenous Every 12 hours 12/06/23 1315     12/05/23 1730  vancomycin (VANCOREADY) IVPB 750 mg/150 mL        750 mg 150 mL/hr over 60 Minutes Intravenous  Once 12/05/23 1618 12/05/23 2216   12/05/23 1715  ceFEPIme (MAXIPIME) 2 g in sodium chloride 0.9 % 100 mL IVPB  Status:  Discontinued        2 g 200 mL/hr over 30 Minutes Intravenous Every 24 hours 12/05/23 1616 12/06/23 1315       Subjective: Seen and examined at bedside and was feeling okay today and not as short of breath but appears very weak and frail.  Back on her home liters of oxygen.  Heart rate was improved however she went back in A-fib with RVR.  Further goals of care discussion being happened and family and patient are leaning towards hospice and will want to speak with hospice.  Objective: Vitals:   12/07/23 1500 12/07/23 1534 12/07/23 1600 12/07/23 1700  BP: (!) 105/56  (!) 97/57 115/70  Pulse: 85  (!) 106 74  Resp: 19 20 (!) 22 19  Temp:    98.5 F (36.9 C)  TempSrc:    Oral  SpO2: 100%  95% 100%  Weight:      Height:        Intake/Output Summary (Last 24 hours) at 12/07/2023 1919 Last data filed at 12/07/2023 1449 Gross per 24 hour  Intake 353.32 ml  Output 2100 ml  Net -1746.68 ml   Filed Weights   12/05/23 0702 12/06/23  0307 12/07/23 0251  Weight: 37.2 kg 38.9 kg 38.8 kg   Examination: Physical Exam:  Constitutional: Thin frail and extremely cachectic chronically ill-appearing Caucasian female who appears a bit more comfortable today compared to yesterday  Respiratory: Diminished to auscultation bilaterally with some coarse breath sounds, no wheezing, rales, rhonchi or crackles. Normal respiratory effort and patient is not tachypenic. No accessory muscle use.  Wearing supplemental oxygen at 3 L Cardiovascular: Irregularly irregular, no murmurs / rubs / gallops. S1 and S2 auscultated. No extremity edema. Abdomen: Soft, non-tender, non-distended. Bowel sounds positive.  GU: Deferred. Musculoskeletal: No clubbing / cyanosis of digits/nails. No joint deformity upper and lower extremities.  Skin:  No rashes, lesions, ulcers on limited skin evaluation. No induration; Warm and dry.  Neurologic: CN 2-12 grossly intact with no focal deficits but did not speak to me given her dry lips and mouth per daughter. Romberg sign and cerebellar reflexes not assessed.  Psychiatric: Normal judgment and insight. Alert and oriented x 3. Appears calmer  Data Reviewed: I have personally reviewed following labs and imaging studies  CBC: Recent Labs  Lab 12/03/23 1037 12/04/23 0309 12/05/23 0308 12/06/23 0342 12/07/23 0420  WBC 8.2 12.1* 15.0* 14.3* 7.9  NEUTROABS  --  10.3* 13.4* 12.0* 6.7  HGB 8.2* 8.6* 8.8* 8.7* 9.1*  HCT 26.6* 27.6* 27.5* 27.5* 28.5*  MCV 89.6 90.5 88.7 89.0 88.8  PLT 397 395 344 312 315   Basic Metabolic Panel: Recent Labs  Lab 12/03/23 1037 12/04/23 0309 12/05/23 0308 12/05/23 1624 12/06/23 0342 12/07/23 0420 12/07/23 1305 12/07/23 1449  NA 139 140 141 142 143 142 138 142  K 3.4* 3.5 4.0 3.8 3.6 2.5* 7.1* 4.1  CL 95* 96* 96* 95* 96* 91* 90* 95*  CO2 33* 31 30 29  33* 38* 36* 37*  GLUCOSE 88 279* 176* 147* 141* 117* 284* 116*  BUN 24* 23 39* 42* 39* 33* 30* 31*  CREATININE 0.85 0.90 1.20*  1.04* 0.85 0.75 0.83 0.82  CALCIUM 9.1 9.2 9.5 9.2 9.2 8.8* 8.6* 8.8*  MG 2.0 2.0 2.0  --  1.9 1.8  --   --   PHOS 2.5 4.6 5.1*  --   --  2.0*  --   --    GFR: Estimated Creatinine Clearance: 37.4 mL/min (by C-G formula based on SCr of 0.82 mg/dL). Liver Function Tests: Recent Labs  Lab 12/04/23 0309 12/05/23 0308 12/05/23 1624 12/06/23 0342 12/07/23 0420  AST 29 3,509* 2,575* 1,706* 729*  ALT 18 2,153* 2,215* 1,916* 1,358*  ALKPHOS 56 73 72 70 66  BILITOT 0.4 0.7 0.6 0.6 1.0  PROT 6.5 7.4 7.0 6.8 6.3*  ALBUMIN 3.3* 3.8 3.6 3.4* 3.0*   No results for input(s): "LIPASE", "AMYLASE" in the last 168 hours. No results for input(s): "AMMONIA" in the last 168 hours. Coagulation Profile: Recent Labs  Lab 12/05/23 1034 12/06/23 0342  INR 3.3* 3.8*   Cardiac Enzymes: Recent Labs  Lab 12/05/23 1034  CKTOTAL 208   BNP (last 3 results) No results for input(s): "PROBNP" in the last 8760 hours. HbA1C: No results for input(s): "HGBA1C" in the last 72 hours. CBG: No results for input(s): "GLUCAP" in the last 168 hours. Lipid Profile: No results for input(s): "CHOL", "HDL", "LDLCALC", "TRIG", "CHOLHDL", "LDLDIRECT" in the last 72 hours. Thyroid Function Tests: No results for input(s): "TSH", "T4TOTAL", "FREET4", "T3FREE", "THYROIDAB" in the last 72 hours. Anemia Panel: Recent Labs    12/05/23 0308  VITAMINB12 4,049*  FOLATE >40.0  FERRITIN >7,500*  TIBC 364  IRON 86  RETICCTPCT 1.4   Sepsis Labs: Recent Labs  Lab 12/05/23 1624 12/05/23 1909 12/06/23 0342 12/07/23 1013 12/07/23 1305  PROCALCITON 1.05  --   --   --   --   LATICACIDVEN  --  4.0* 2.0* 1.6 1.2   Recent Results (from the past 240 hours)  Resp panel by RT-PCR (RSV, Flu A&B, Covid) Anterior Nasal Swab     Status: None   Collection Time: 12/02/23  2:38 PM   Specimen: Anterior Nasal Swab  Result Value Ref Range Status   SARS Coronavirus 2 by RT PCR NEGATIVE NEGATIVE Final   Influenza A by PCR  NEGATIVE  NEGATIVE Final   Influenza B by PCR NEGATIVE NEGATIVE Final    Comment: (NOTE) The Xpert Xpress SARS-CoV-2/FLU/RSV plus assay is intended as an aid in the diagnosis of influenza from Nasopharyngeal swab specimens and should not be used as a sole basis for treatment. Nasal washings and aspirates are unacceptable for Xpert Xpress SARS-CoV-2/FLU/RSV testing.  Fact Sheet for Patients: BloggerCourse.com  Fact Sheet for Healthcare Providers: SeriousBroker.it  This test is not yet approved or cleared by the [place] FDA and has been authorized for detection and/or diagnosis of SARS-CoV-2 by FDA under an Emergency Use Authorization (EUA). This EUA will remain in effect (meaning this test can be used) for the duration of the COVID-19 declaration under Section 564(b)(1) of the Act, 21 U.S.C. section 360bbb-3(b)(1), unless the authorization is terminated or revoked.     Resp Syncytial Virus by PCR NEGATIVE NEGATIVE Final    Comment: (NOTE) Fact Sheet for Patients: BloggerCourse.com  Fact Sheet for Healthcare Providers: SeriousBroker.it  This test is not yet approved or cleared by the [place] FDA and has been authorized for detection and/or diagnosis of SARS-CoV-2 by FDA under an Emergency Use Authorization (EUA). This EUA will remain in effect (meaning this test can be used) for the duration of the COVID-19 declaration under Section 564(b)(1) of the Act, 21 U.S.C. section 360bbb-3(b)(1), unless the authorization is terminated or revoked.  Performed at [facility], [address] [place], [place] [address]   Culture, blood (Routine X 2) w Reflex to ID Panel     Status: None (Preliminary result)   Collection Time: 12/05/23  4:24 PM   Specimen: BLOOD  Result Value Ref Range Status   Specimen Description BLOOD BLOOD RIGHT ARM  Final   Special Requests   Final     BOTTLES DRAWN AEROBIC ONLY Blood Culture results may not be optimal due to an inadequate volume of blood received in culture bottles   Culture   Final    NO GROWTH 2 DAYS Performed at [facility], [address] [place], [place] [address]    Report Status PENDING  Incomplete  Culture, blood (Routine X 2) w Reflex to ID Panel     Status: None (Preliminary result)   Collection Time: 12/05/23  4:25 PM   Specimen: BLOOD  Result Value Ref Range Status   Specimen Description BLOOD BLOOD RIGHT HAND  Final   Special Requests   Final    BOTTLES DRAWN AEROBIC ONLY Blood Culture results may not be optimal due to an inadequate volume of blood received in culture bottles   Culture   Final    NO GROWTH 2 DAYS Performed at [facility], [address] [place], [place] [address]    Report Status PENDING  Incomplete    Radiology Studies: DG CHEST PORT 1 VIEW Result Date: 12/07/2023 CLINICAL DATA:  Shortness of breath. EXAM: PORTABLE CHEST 1 VIEW COMPARISON:  December 05, 2023. FINDINGS: Stable cardiomediastinal silhouette. Right internal jugular catheter is noted with tip in expected position of SVC. Bibasilar opacities are noted concerning for atelectasis or infiltrates with associated effusions. Bony thorax is unremarkable. IMPRESSION: Stable bibasilar opacities and pleural effusions as noted above. Electronically Signed   By: Lupita Raider M.D.   On: 12/07/2023 09:28   IR Fluoro Guide CV Line Right Result Date: 12/06/2023 CLINICAL DATA:  Heart failure and need for tunneled central venous catheter placement for IV infusion. EXAM: TUNNELED CENTRAL VENOUS CATHETER PLACEMENT  WITH ULTRASOUND AND FLUOROSCOPIC GUIDANCE ANESTHESIA/SEDATION: None MEDICATIONS: None FLUOROSCOPY: 18 seconds.  0.3 mGy. PROCEDURE: The procedure, risks, benefits, and alternatives were explained to the patient. Questions regarding the procedure were encouraged and answered. The patient understands and consents to the procedure.  A timeout was performed prior to initiating the procedure. The right neck and chest were prepped with chlorhexidine in a sterile fashion, and a sterile drape was applied covering the operative field. Maximum barrier sterile technique with sterile gowns and gloves were used for the procedure. Local anesthesia was provided with 1% lidocaine. Ultrasound was used to confirm patency of the right internal jugular vein. An ultrasound image was saved and recorded. After creating a small venotomy incision, a 21 gauge needle was advanced into the right internal jugular vein under direct, real-time ultrasound guidance. Ultrasound image documentation was performed. After securing guidewire access, a 6 French peel-away sheath was placed. A wire was kinked to measure appropriate catheter length. A 6 French, dual-lumen power line tunneled central venous catheter was chosen for placement. This was tunneled in a retrograde fashion from the chest wall to the venotomy incision. The catheter was cut to 21 cm based on guidewire measurement. The catheter was then placed through the sheath and the sheath removed. Final catheter positioning was confirmed and documented with a fluoroscopic spot image. The catheter was aspirated and flushed with saline. The venotomy incision was closed with subcuticular 4-0 Vicryl. Dermabond was applied to the incision. The catheter exit site was secured with prolene retention sutures. COMPLICATIONS: None.  No pneumothorax. FINDINGS: After catheter placement, the tip lies at the SVC/RA junction. The catheter aspirates normally and is ready for immediate use. IMPRESSION: Placement of tunneled, dual-lumen central venous catheter via the right internal jugular vein. The catheter tip lies at the SVC/RA junction. The catheter is ready for immediate use. Electronically Signed   By: Irish Lack M.D.   On: 12/06/2023 15:36   IR US Guide Vasc Access Right Result Date: 12/06/2023 CLINICAL DATA:  Heart failure  and need for tunneled central venous catheter placement for IV infusion. EXAM: TUNNELED CENTRAL VENOUS CATHETER PLACEMENT WITH ULTRASOUND AND FLUOROSCOPIC GUIDANCE ANESTHESIA/SEDATION: None MEDICATIONS: None FLUOROSCOPY: 18 seconds.  0.3 mGy. PROCEDURE: The procedure, risks, benefits, and alternatives were explained to the patient. Questions regarding the procedure were encouraged and answered. The patient understands and consents to the procedure. A timeout was performed prior to initiating the procedure. The right neck and chest were prepped with chlorhexidine in a sterile fashion, and a sterile drape was applied covering the operative field. Maximum barrier sterile technique with sterile gowns and gloves were used for the procedure. Local anesthesia was provided with 1% lidocaine. Ultrasound was used to confirm patency of the right internal jugular vein. An ultrasound image was saved and recorded. After creating a small venotomy incision, a 21 gauge needle was advanced into the right internal jugular vein under direct, real-time ultrasound guidance. Ultrasound image documentation was performed. After securing guidewire access, a 6 French peel-away sheath was placed. A wire was kinked to measure appropriate catheter length. A 6 French, dual-lumen power line tunneled central venous catheter was chosen for placement. This was tunneled in a retrograde fashion from the chest wall to the venotomy incision. The catheter was cut to 21 cm based on guidewire measurement. The catheter was then placed through the sheath and the sheath removed. Final catheter positioning was confirmed and documented with a fluoroscopic spot image. The catheter was aspirated and flushed with saline. The  venotomy incision was closed with subcuticular 4-0 Vicryl. Dermabond was applied to the incision. The catheter exit site was secured with prolene retention sutures. COMPLICATIONS: None.  No pneumothorax. FINDINGS: After catheter placement, the  tip lies at the SVC/RA junction. The catheter aspirates normally and is ready for immediate use. IMPRESSION: Placement of tunneled, dual-lumen central venous catheter via the right internal jugular vein. The catheter tip lies at the SVC/RA junction. The catheter is ready for immediate use. Electronically Signed   By: Irish Lack M.D.   On: 12/06/2023 15:36   ECHOCARDIOGRAM COMPLETE Result Date: 12/06/2023    ECHOCARDIOGRAM REPORT   Patient Name:   [name] Date of Exam: 12/06/2023 Medical Rec #:  [id number]         Height:       60.0 in Accession #:    [id number]        Weight:       85.8 lb Date of Birth:  Jun 01, 1951        BSA:          1.302 m Patient Age:    72 years          BP:           100/75 mmHg Patient Gender: F                 HR:           125 bpm. Exam Location:  Inpatient Procedure: 2D Echo, Cardiac Doppler and Color Doppler (Both Spectral and Color            Flow Doppler were utilized during procedure). Indications:    Cardiomyopathy-unspecified I42.9  History:        Patient has prior history of Echocardiogram examinations, most                 recent 11/16/2023. CHF and Cardiomyopathy,                 Signs/Symptoms:Shortness of Breath; Risk Factors:Dyslipidemia.  Sonographer:    Rosaland Lao Sonographer#2:  Darlys Gales Referring Phys: [id number] ADITYA SABHARWAL IMPRESSIONS  1. Left ventricular ejection fraction, by estimation, is 45 to 50% with beat to beat variability. The left ventricle has mildly decreased function. The left ventricle demonstrates global hypokinesis. There is mild left ventricular hypertrophy. Left ventricular diastolic parameters are indeterminate.  2. Right ventricular systolic function is moderately reduced. The right ventricular size is mildly enlarged. There is mildly elevated pulmonary artery systolic pressure. The estimated right ventricular systolic pressure is 38.2 mmHg.  3. Left atrial size was severely dilated.  4. Right atrial size was severely  dilated.  5. The mitral valve is abnormal. Severe mitral valve regurgitation. No evidence of mitral stenosis.  6. Tricuspid valve regurgitation is moderate to severe.  7. The aortic valve is tricuspid. Aortic valve regurgitation is not visualized. No aortic stenosis is present.  8. The inferior vena cava is dilated in size with <50% respiratory variability, suggesting right atrial pressure of 15 mmHg. FINDINGS  Left Ventricle: Left ventricular ejection fraction, by estimation, is 45 to 50%. The left ventricle has mildly decreased function. The left ventricle demonstrates global hypokinesis. The left ventricular internal cavity size was normal in size. There is  mild left ventricular hypertrophy. Left ventricular diastolic parameters are indeterminate. Right Ventricle: The right ventricular size is mildly enlarged. No increase in right ventricular wall thickness. Right ventricular systolic function is moderately reduced. There is mildly elevated pulmonary artery systolic pressure.  The tricuspid regurgitant velocity is 2.41 m/s, and with an assumed right atrial pressure of 15 mmHg, the estimated right ventricular systolic pressure is 38.2 mmHg. Left Atrium: Left atrial size was severely dilated. Right Atrium: Right atrial size was severely dilated. Pericardium: There is no evidence of pericardial effusion. Mitral Valve: The mitral valve is abnormal. Severe mitral valve regurgitation. No evidence of mitral valve stenosis. Tricuspid Valve: The tricuspid valve is normal in structure. Tricuspid valve regurgitation is moderate to severe. No evidence of tricuspid stenosis. Aortic Valve: The aortic valve is tricuspid. Aortic valve regurgitation is not visualized. No aortic stenosis is present. Pulmonic Valve: The pulmonic valve was normal in structure. Pulmonic valve regurgitation is mild. No evidence of pulmonic stenosis. Aorta: The aortic root is normal in size and structure. Ascending aorta measurements are within normal  limits for age when indexed to body surface area. Venous: The inferior vena cava is dilated in size with less than 50% respiratory variability, suggesting right atrial pressure of 15 mmHg. IAS/Shunts: No atrial level shunt detected by color flow Doppler.  LEFT VENTRICLE PLAX 2D LVIDd:         4.10 cm LVIDs:         2.70 cm LV PW:         0.90 cm LV IVS:        1.00 cm LVOT diam:     1.70 cm LVOT Area:     2.27 cm  RIGHT VENTRICLE            IVC RV Basal diam:  3.40 cm    IVC diam: 2.10 cm RV S prime:     9.49 cm/s LEFT ATRIUM           Index        RIGHT ATRIUM           Index LA diam:      4.10 cm 3.15 cm/m   RA Area:     20.40 cm LA Vol (A4C): 60.6 ml 46.53 ml/m  RA Volume:   51.90 ml  39.85 ml/m   AORTA Ao Root diam: 3.70 cm Ao Asc diam:  3.60 cm MR Peak grad: 89.2 mmHg   TRICUSPID VALVE MR Vmax:      472.33 cm/s TR Peak grad:   23.2 mmHg                           TR Vmax:        241.00 cm/s                            SHUNTS                           Systemic Diam: 1.70 cm Weston Brass MD Electronically signed by Weston Brass MD Signature Date/Time: 12/06/2023/3:20:13 PM    Final    Scheduled Meds:  apixaban  5 mg Oral BID   budesonide (PULMICORT) nebulizer solution  0.25 mg Nebulization BID   citalopram  20 mg Oral Daily   digoxin  0.125 mg Oral Daily   midodrine  5 mg Oral TID WC   Continuous Infusions:  amiodarone 60 mg/hr (12/07/23 1657)   ceFEPime (MAXIPIME) IV 2 g (12/07/23 1449)   vancomycin 500 mg (12/06/23 2118)    LOS: 5 days   Marguerita Merles, DO [facility] Available via Epic secure chat  7am-7pm After these hours, please refer to coverage provider listed on [email or web address] 12/07/2023, 7:19 PM

## 2023-12-07 NOTE — Consult Note (Signed)
 ADVANCED HEART FAILURE PROGRESS NOTE  Referring Physician: No ref. provider found  Primary Care: Melida Quitter, PA  CC: Undifferentiated shock  Interval hx:  - Very weak today; sCr & LFTs improving.    Current Facility-Administered Medications:    acetaminophen (TYLENOL) tablet 650 mg, 650 mg, Oral, Q6H PRN, Hall, Carole N, DO   albuterol (PROVENTIL) (2.5 MG/3ML) 0.083% nebulizer solution 3 mL, 3 mL, Inhalation, Q6H PRN, Marland Mcalpine, Omair Latif, DO, 3 mL at 12/04/23 0217   [COMPLETED] amiodarone (NEXTERONE) 1.8 mg/mL load via infusion 150 mg, 150 mg, Intravenous, Once, 150 mg at 12/06/23 1012 **FOLLOWED BY** [EXPIRED] amiodarone (NEXTERONE PREMIX) 360-4.14 MG/200ML-% (1.8 mg/mL) IV infusion, 60 mg/hr, Intravenous, Continuous, Last Rate: 33.3 mL/hr at 12/06/23 1256, 60 mg/hr at 12/06/23 1256 **FOLLOWED BY** amiodarone (NEXTERONE PREMIX) 360-4.14 MG/200ML-% (1.8 mg/mL) IV infusion, 30 mg/hr, Intravenous, Continuous, Crystale Giannattasio, DO, Last Rate: 16.67 mL/hr at 12/07/23 0838, 30 mg/hr at 12/07/23 0838   apixaban (ELIQUIS) tablet 5 mg, 5 mg, Oral, BID, Hall, Carole N, DO, 5 mg at 12/07/23 0839   budesonide (PULMICORT) nebulizer solution 0.25 mg, 0.25 mg, Nebulization, BID, Sheikh, Omair Latif, DO, 0.25 mg at 12/07/23 0859   ceFEPIme (MAXIPIME) 2 g in sodium chloride 0.9 % 100 mL IVPB, 2 g, Intravenous, Q12H, Sheikh, Omair Latif, DO, Last Rate: 200 mL/hr at 12/07/23 0116, 2 g at 12/07/23 0116   citalopram (CELEXA) tablet 20 mg, 20 mg, Oral, Daily, Hall, Carole N, DO, 20 mg at 12/07/23 0840   digoxin (LANOXIN) tablet 0.125 mg, 0.125 mg, Oral, Daily, Darriel Sinquefield, DO, 0.125 mg at 12/07/23 0840   furosemide (LASIX) injection 20 mg, 20 mg, Intravenous, Q12H, Sheikh, Omair Latif, DO, 20 mg at 12/07/23 0840   magnesium sulfate IVPB 1 g 100 mL, 1 g, Intravenous, Once, Opyd, Lavone Neri, MD   melatonin tablet 5 mg, 5 mg, Oral, QHS PRN, Dow Adolph N, DO, 5 mg at 12/06/23 2119   midodrine  (PROAMATINE) tablet 5 mg, 5 mg, Oral, TID WC, Raynald Rouillard, DO, 5 mg at 12/07/23 0839   milrinone (PRIMACOR) 20 MG/100 ML (0.2 mg/mL) infusion, 0.125 mcg/kg/min, Intravenous, Continuous, Tobi Leinweber, DO, Last Rate: 1.4 mL/hr at 12/07/23 0838, 0.125 mcg/kg/min at 12/07/23 0838   polyethylene glycol (MIRALAX / GLYCOLAX) packet 17 g, 17 g, Oral, Daily PRN, Margo Aye, Carole N, DO   potassium chloride 10 mEq in 100 mL IVPB, 10 mEq, Intravenous, Q1 Hr x 3, Opyd, Lavone Neri, MD, Last Rate: 100 mL/hr at 12/07/23 0839, 10 mEq at 12/07/23 0839   potassium chloride SA (KLOR-CON M) CR tablet 40 mEq, 40 mEq, Oral, Q4H, Opyd, Lavone Neri, MD, 40 mEq at 12/07/23 0553   potassium PHOSPHATE 15 mmol in dextrose 5 % 250 mL infusion, 15 mmol, Intravenous, Once, Simmons, Brittainy M, PA-C   prochlorperazine (COMPAZINE) injection 5 mg, 5 mg, Intravenous, Q6H PRN, Hall, Carole N, DO, 5 mg at 12/04/23 0213   vancomycin (VANCOREADY) IVPB 500 mg/100 mL, 500 mg, Intravenous, Q24H, Marguerita Merles [place], DO, Last Rate: 100 mL/hr at 12/06/23 2118, 500 mg at 12/06/23 2118   PHYSICAL EXAM: Vitals:   12/07/23 0800 12/07/23 0840  BP: 103/62   Pulse: 69 77  Resp: 20   Temp: 97.7 F (36.5 C)   SpO2: 100%    GENERAL: thin; more awake today.  Lungs- decreased at baess CARDIAC:  RRR ABDOMEN: Soft, non-tender, non-distended.  EXTREMITIES: warm NEUROLOGIC: No obvious FND   DATA REVIEW  ECG: 12/02/23: NSR  As  per my personal interpretation  ECHO: 11/15/23: LVEF 65%, normal RV function. Severe MR As per my personal interpretation  Labs:     Latest Ref Rng & Units 12/07/2023    4:20 AM 12/06/2023    3:42 AM 12/05/2023    3:08 AM  CBC  WBC 4.0 - 10.5 K/uL 7.9  14.3  15.0   Hemoglobin 12.0 - 15.0 g/dL 9.1  8.7  8.8   Hematocrit 36.0 - 46.0 % 28.5  27.5  27.5   Platelets 150 - 400 K/uL 315  312  344        Latest Ref Rng & Units 12/07/2023    4:20 AM 12/06/2023    3:42 AM 12/05/2023    4:24 PM  BMP  Glucose 70 - 99 mg/dL  130  865  784   BUN 8 - 23 mg/dL 33  39  42   Creatinine 0.44 - 1.00 mg/dL 6.96  2.95  2.84   Sodium 135 - 145 mmol/L 142  143  142   Potassium 3.5 - 5.1 mmol/L 2.5  3.6  3.8   Chloride 98 - 111 mmol/L 91  96  95   CO2 22 - 32 mmol/L 38  33  29   Calcium 8.9 - 10.3 mg/dL 8.8  9.2  9.2      ASSESSMENT & PLAN:  Undifferentiated shock - I evaluated patient during her liver ultrasound yesterday.  IVC 2 cm however not collapsible.  Distal extremities warm to touch with no pulse alternans.   - Unable to place PICC line due to bilateral mastectomies. - Repeat TTE pending; further evaluation pending study.  - decrease milrinone to 0.132mc/kg/min - Patient has underlying severe mitral regurgitation. - LFTs now improving - Mixed venous 57% today on milrinone 0.153mcg/kg/min; will plan to wean off over the next 24h.  - PICC line placed yesterday; will start CVP monitoring & adjust diuretics accordingly.   2. Atrial fibrillation with RVR - now in normal sinus rhythm; continue apixaban 5mg  BID - continue amio gtt through today. Will transition to PO tomorrow as   3.  Severe mitral regurgitation - Extremely frail and weak on exam with a BMI of 16.  Patient is unlikely to be a candidate for any advanced therapies including MitraClip. Baseline function status appears to be very poor. I believe she needs to improve her nutritional status, advance with physical therapy before pursuing any kind of interventional therapies.  - Patient & family would benefit from a goals of care discussion prior to any invasive procedures.  - Planning for palliative care discussion today.  4. Frailty -Body mass index is 16.71 kg/m.  5. COPD - CT chest from admission with emphysema; stable on my exam.    Mouhamadou Gittleman Advanced Heart Failure Mechanical Circulatory Support

## 2023-12-07 NOTE — Progress Notes (Signed)
 Daily Progress Note   Patient Name: [name]       Date: 12/07/2023 DOB: October 02, 1950  Age: 73 y.o. MRN#: [id number] Attending Physician: Merlene Laughter, DO Primary Care Physician: Melida Quitter, PA Admit Date: 12/02/2023 Length of Stay: 5 days  Reason for Consultation/Follow-up: Establishing goals of care  HPI/Patient Profile:  [name] is a 73 y.o. female with multiple medical problems including COPD on 3 L O2, nonischemic cardiomyopathy after breast cancer treatment, diastolic dysfunction with EF of 55%, paroxysmal A-fib on Eliquis, severe MR being evaluated for MitraClip. Patient recently hospitalized 11/14/2023 to 11/18/2023 with CHF. Patient seen by interventional cardiology with plan for outpatient evaluation for MitraClip. Patient readmitted 12/02/2023 with acute on chronic diastolic CHF. Patient initially hypotensive and unable to tolerate diuretics. She was started on midodrine and albumin. Palliative care consulted to address goals.   Subjective:   Subjective: Chart Reviewed. Updates received. Patient Assessed. Created space and opportunity for patient  and family to explore thoughts and feelings regarding current medical situation.  Today's Discussion: Today before seeing the patient I spoke with the heart failure team.  They indicated that the patient has not any candidate for MitraClip and there is limited interventions they can do to improve her heart failure.  I feel that she will likely feel better in sinus rhythm, but she will likely become sick again.  They feel hospice is a reasonable approach at this time.  Today I went to the patient's room and saw the patient at the bedside, her daughter was present.  Waiting for meeting at 9:00 with the structural heart team.  Dr. Excell Seltzer arrived a short time after and provided a very thorough explanation of the clinical situation.  During that time the patient noted she been dyspneic for a while and on oxygen for about 1  year at 3 L/min.  She was doing okay per her cardiologist and got suddenly worse.  She was at [facility] and had tachycardia and hypoxia with PT/OT for which she was sent to the hospital and found to be in A-fib as well as fluid overload.  She was discharged back to rehab and then on Thursday BMP sent her to the hospital where they again found fluid buildup.  Over the weekend she had a significant decline including becoming very bad on  Saturday.  At the end of the conversation Dr. Excell Seltzer indicated that she is not a candidate for MitraClip at this time.  We discussed that given this, the need to explore the situation to see what her options are.  I shared the information from the heart failure team that without a MitraClip they are very limited in what they could do to improve her situation.  Essentially she is at end-stage heart failure and does not have an option for "cure".  However, if she remains in rhythm on amiodarone (is currently normal sinus rhythm) then she could have relatively low symptom burden for some time.  However, she will likely become sick again and we need to discuss how she would like to approach this.  First we talked about CODE STATUS.  After thorough explanation of CPR and outcomes and patient's similar to her the patient, supported by her daughter, indicated she would like to be a DNR/DNI.  Next we talked about the amiodarone being able to be can transition over to oral form.  However, milrinone is not available in pill form and she is not a candidate for IV  milrinone at home (per heart failure team).  The question is when she comes off the milrinone whether or not being back in rhythm will be "good enough" for her symptoms to be managed.  We then had a discussion of all the potential options including going back to rehab for attempted strengthening, although the patient is very frail and weak and likely would not do well, as well as options for hospice and comfort care.  At  the end of the conversation the patient indicated she would prefer to be comfortable then pursue aggressive care with no option for care.  We then talked about hospice services. I described hospice as a service for patients who have a life expectancy of 6 months or less. The goal of hospice is the preservation of dignity and quality at the end phases of life. Under hospice care, the focus changes from curative to symptom relief. I explained the three setting where hospice services can be provided including the home, at a living facility (such as LTC SNF, Assisted Living, etc), and a hospice facility. I explained that acceptance to hospice in any specific location is the final decision of the hospice medical director and bed availability, if applicable. They verbalized understanding.  At the end of the conversation the patient and her daughter indicated they would like to speak with hospice.  I offered choice and they selected [name] because that is who helped the patient's husband when he passed.  I shared that hospital would be out to them.  We discussed possible options of home with hospice (which is not an option for the patient due to care needs), long-term care with hospice services (the patient states that she does have Medicaid), versus evaluation for inpatient hospice.  They seem to indicate that the preference is for inpatient hospice at this time.  I shared the palliative medicine team would contact social work to have a referral sent to hospice.  They should hear from them today.  Palliative medicine will follow-up tomorrow for ongoing discussions and help with decision-making.  I provided emotional and general support through therapeutic listening, empathy, sharing of stories, and other techniques. I answered all questions and addressed all concerns to the best of my ability.  After my visit I debriefed with the medical team and nursing team.  Later in the day I received a phone  call from hospice and provided requested clinical information over the phone.  They have evaluated and the patient's family has requested further clarification, which is provided.  Hospice liaison feels that she would likely qualify for inpatient hospice but will need to send the MD first.  The plan is to transition her IV amiodarone to oral form, shut off the milrinone, give it overnight to see how she does.  We can make further decisions tomorrow morning.  ROS Limited due to frailty  Review of Systems  Constitutional:  Positive for fatigue.  Respiratory:  Positive for shortness of breath (improved).   Cardiovascular:  Negative for chest pain.  Gastrointestinal:  Negative for abdominal pain, nausea and vomiting.  Neurological:  Positive for weakness.    Objective:   Vital Signs:  BP 119/69 (BP Location: Right Arm)   Pulse 72   Temp 97.7 F (36.5 C) (Oral)   Resp 20   Ht 5' (1.524 m)   Wt 38.8 kg   SpO2 100%   BMI 16.71 kg/m   Physical Exam Vitals and nursing note reviewed.  Constitutional:  General: She is not in acute distress.    Appearance: She is ill-appearing.     Comments: Appears very frail and weak  HENT:     Head: Normocephalic and atraumatic.  Cardiovascular:     Rate and Rhythm: Normal rate.  Pulmonary:     Effort: Pulmonary effort is normal. No respiratory distress.  Abdominal:     General: Abdomen is flat.     Palpations: Abdomen is soft.  Skin:    General: Skin is warm and dry.  Neurological:     General: No focal deficit present.     Mental Status: She is alert.  Psychiatric:        Mood and Affect: Mood normal.        Behavior: Behavior normal.     Palliative Assessment/Data: 20-30%    Existing Vynca/ACP Documentation: None  Assessment & Plan:   Impression: Present on Admission:  Acute on chronic diastolic CHF (congestive heart failure) (HCC)  SUMMARY OF RECOMMENDATIONS   Changed to DNR-Limited  Palliative medicine will continue  to follow  Symptom Management:  Per primary team PMT is available to assist as needed  Code Status: DNR-Limited  Prognosis: Unable to determine: pending symptom burden off milrinone  Discharge Planning: To Be Determined  Discussed with: Patient, family, medical team, nursing team, [facility] team, hospice liaison  Thank you for allowing [place] to participate in the care of [name] PMT will continue to support holistically.  Time Total: 91 min  Detailed review of medical records (labs, imaging, vital signs), medically appropriate exam, discussed with treatment team, counseling and education to patient, family, & staff, documenting clinical information, medication management, coordination of care  Wynne Dust, NP Palliative Medicine Team  Team Phone # [phone] (Nights/Weekends)  04/29/2021, 8:17 AM

## 2023-12-08 DIAGNOSIS — I5033 Acute on chronic diastolic (congestive) heart failure: Secondary | ICD-10-CM | POA: Diagnosis not present

## 2023-12-08 LAB — COOXEMETRY PANEL
Carboxyhemoglobin: 1.6 % — ABNORMAL HIGH (ref 0.5–1.5)
Carboxyhemoglobin: 1.4 % (ref 0.5–1.5)
Methemoglobin: <0.7 % (ref 0.0–1.5)
Methemoglobin: <0.7 % (ref 0.0–1.5)
O2 Saturation: 64.1 %
O2 Saturation: 64.1 %
Total hemoglobin: 8.7 g/dL — ABNORMAL LOW (ref 12.0–16.0)
Total hemoglobin: 9.4 g/dL — ABNORMAL LOW (ref 12.0–16.0)

## 2023-12-08 LAB — MAGNESIUM: Magnesium: 2.2 mg/dL (ref 1.7–2.4)

## 2023-12-08 LAB — COMPREHENSIVE METABOLIC PANEL WITH GFR
ALT: 895 U/L — ABNORMAL HIGH (ref 0–44)
AST: 305 U/L — ABNORMAL HIGH (ref 15–41)
Albumin: 2.7 g/dL — ABNORMAL LOW (ref 3.5–5.0)
Alkaline Phosphatase: 63 U/L (ref 38–126)
Anion gap: 10 (ref 5–15)
BUN: 30 mg/dL — ABNORMAL HIGH (ref 8–23)
CO2: 36 mmol/L — ABNORMAL HIGH (ref 22–32)
Calcium: 8.5 mg/dL — ABNORMAL LOW (ref 8.9–10.3)
Chloride: 96 mmol/L — ABNORMAL LOW (ref 98–111)
Creatinine, Ser: 0.68 mg/dL (ref 0.44–1.00)
GFR, Estimated: >60 mL/min (ref >60)
Glucose, Bld: 94 mg/dL (ref 70–99)
Potassium: 3.8 mmol/L (ref 3.5–5.1)
Sodium: 142 mmol/L (ref 135–145)
Total Bilirubin: 0.8 mg/dL (ref 0.0–1.2)
Total Protein: 5.9 g/dL — ABNORMAL LOW (ref 6.5–8.1)

## 2023-12-08 MED ORDER — AMIODARONE HCL IN DEXTROSE 360-4.14 MG/200ML-% IV SOLN
30.0000 mg/h | INTRAVENOUS | Status: DC
  Administered 2023-12-08: 30 mg/h via INTRAVENOUS
  Filled 2023-12-08: qty 200

## 2023-12-08 MED ORDER — AMIODARONE HCL 200 MG PO TABS
400.0000 mg | ORAL_TABLET | Freq: Every day | ORAL | Status: DC
  Administered 2023-12-08: 400 mg via ORAL
  Filled 2023-12-08: qty 2

## 2023-12-08 MED ORDER — DIGOXIN 125 MCG PO TABS: 0.1250 mg | ORAL_TABLET | Freq: Every day | ORAL | Status: AC

## 2023-12-08 MED ORDER — MIDODRINE HCL 5 MG PO TABS: 5.0000 mg | ORAL_TABLET | Freq: Three times a day (TID) | ORAL | Status: AC

## 2023-12-08 MED ORDER — ACETAMINOPHEN 325 MG PO TABS: 650.0000 mg | ORAL_TABLET | Freq: Four times a day (QID) | ORAL | Status: AC | PRN

## 2023-12-08 NOTE — Progress Notes (Signed)
 Patient discharged to hospice at [facility] with one IV in place because [facility] wanted me to leave it since they will be administering IV meds.

## 2023-12-08 NOTE — Progress Notes (Signed)
 12/08/2023 Discussed with RN: going to hospice potentially today. PCCM available PRN.  Myrla Halsted MD PCCM

## 2023-12-08 NOTE — Progress Notes (Signed)
 OT Cancellation Note  Patient Details Name: [name] MRN: [id number] DOB: 20-Sep-1950   Cancelled Treatment:    Reason Eval/Treat Not Completed: Other (comment) (Pt discharging to [facility] for Hospice care. OT signing off.)  Alissia Lory,HILLARY 12/08/2023, 1:01 PM Luisa Dago, OT/L   Acute OT Clinical Specialist Acute Rehabilitation Services Pager [phone] Office [phone]

## 2023-12-08 NOTE — Progress Notes (Signed)
 [facility] Liaison Note  12/08/2023  [name] 1951-02-27 [id number]  Location: RN Hospital Liaison screened the patient remotely at [facility].  Insurance: [employer]   [name] is a 73 y.o. female who is a Primary Care Patient of Jairo Ben, Simmie Davies, PA [facility] at [facility]. The patient was screened for 30 day readmission hospitalization with noted extreme risk score for unplanned readmission risk with 3 IP/2 ED in 6 months.  The patient was assessed for potential Care Management service needs for post hospital transition for care coordination. Review of patient's electronic medical record reveals patient was admitted for acute encephalopathy. Pt will transitioned to inpt hospice at [facility] place upon her discharge. No additional needs to address at this time.   [facility] Care Management/Population Health does not replace or interfere with any arrangements made by the Inpatient Transition of Care team.   For questions contact:   Elliot Cousin, RN, BSN Hospital Liaison [place]   [facility], Population Health Office Hours MTWF  8:00 am-6:00 pm Direct Dial: [phone] [email or web address]

## 2023-12-08 NOTE — Progress Notes (Incomplete)
 PROGRESS NOTE    [name]  WUX:[id number] DOB: February 04, 1951 DOA: 12/02/2023 PCP: Melida Quitter, PA   Brief Narrative: [name] is a 73 y.o. female with medical history significant for COPD on 3 L nasal cannula continuously, nonischemic cardiomyopathy, hyperlipidemia, GERD, recent bimalleolar ankle fracture after fall, paroxysmal A-fib/A-flutter on Eliquis, history of severe MR, moderate pericardial effusion, chronic HFrEF, who presents to the ER via EMS from SNF due to worsening shortness of breath.  Associated with generalized weakness and intermittent palpitations.  No reported chest pain.   In the ER, cardiomegaly with concern for edema on chest x-ray.  BNP elevated greater than 700.  Bilateral lower extremity edema.  The patient received IV Lasix in the ER.  EDP discussed the case with cardiology who recommended medicine admission for further diuresis and management of acute on chronic HFpEF.  Admitted by [facility], hospitalist service.   **Interim History Diuresis was initially held given her low blood pressure midodrine was initiated in order to proceed with diuresis.  PT OT recommended SNF.  General Cardiology consulted and then consulted Structural Cardio team and she is not a candidate for MitraClip at this time.  Labs worsened and her respiratory status continued worsen.  Renal function also worsened and she was hypoperfusing as her lactic acid level continue to trend up and felt to be in shock. PCCM and advanced heart failure team were consulted and were managing managing shock with Milrinone, Amio, Digoxin.  Subsequently LFTs, Renal Fxn, and LA improving she intermittently goes back and forth between A-fib and RVR.  Further goals of care discussion being held and we are waiting for further outcomes and family would like to speak with hospice and family is likely going to consider residential hospice placement.   Assessment and Plan:  Acute on Chronic Diastolic HFpEF /  Sever MR : Presented with BNP of 746.7 and worsened to 2,749.1 -> 2814.3. Has lower extremity edema, progressive dyspnea. IV diuresing initiated in the ER but held now due to BP. Seen by Cardiology and getting IV Albumin and Midodrine. Start strict I's and O's and daily weight. Recent echo done on 11/16/2023 revealed LVEF 60 to 65% and no regional wall motion abnormalities.  Resumed Diuretics with IV Lasix 20 mg BID and give not a candidate for MitraClip per the structural heart team.   -Advanced heart failure team consulted and appreciate their help. They repeated LTD echo yesterday and made changes as below.  -Prognosis is poor and Palliative Consulted and met with Daughter along with the AHF Team and hospice has been discussed and the family would like to speak with hospice heart failure which she has.  Shock with hypoperfusion resulting in AKI, acute liver injury and lactic acidosis - Patient is hypoperfusing LFTs acutely worsened and started renal function - Advanced heart failure team consulted by cardiology and they are recommending PICC line placement him checking CVP - WBC is worsening so she has been initiated on antibiotics. - Will continue diuresis per cardiology given that she appears volume overloaded - PCCM also consulted for further evaluation; she has not been initiated on broad-spectrum antibiotics and getting panculture -Lactic Acid trending down.  -GOC Discussions being had and Patient and Family would like to speak with Hospice and Hospice to meet with the Patient and family are leaning toward [facility] hospice services  Shock liver/acute liver injury: AST and ALT Trend improving: Recent Labs  Lab 11/30/23 1722 12/02/23 1449 12/04/23 0309 12/05/23 0308  12/05/23 1624 12/06/23 0342 12/07/23 0420  AST 23 18 29  3,509* 2,575* 1,706* 729*  ALT 14 12 18  2,153* 2,215* 1,916* 1,358*  BILITOT 0.6 0.3 0.4 0.7 0.6 0.6 1.0  -CTM and repeat CMP in the a.m. and will be  obtaining a right upper quadrant ultrasound and acute hepatitis panel which is negative. -Right upper quadrant ultrasound done showed "Portal venous system is patent with normal direction of flow. Concern for gallbladder wall thickening and pericholecystic fluid. In addition, there appears to be trace perihepatic ascites. Study has technical limitations and the spleen is poorly characterized." -CTM and Trend and thankfully improving   AKI: BUN/Cr Trend improved and is now 31/0.82. Avoid Nephrotoxic Medications, Contrast Dyes, Hypotension and Dehydration to Ensure Adequate Renal Perfusion and will need to Renally Adjust Meds -Repeat UA showed cloudy appearance with amber color urine, small hemoglobin, 100 protein, rare bacteria, 11-20 WBCs.  Urine osmolality was 751, urine sodium 17, urine creatinine was 161 -CTM and Trend Renal Function carefully and repeat CMP in the AM   Acute on Chronic Respiratory Failure with Hypoxia / Hx of COPD on 3 L: Worsening change to Xopenex/Atrovent q6h, Brovana and Budesonide. Add Flutter Valve and Incentive Spirometry.  Maintain O2 saturation above 90%. SpO2: 100 % O2 Flow Rate (L/min): 3 L/min. Repeat CXR this AM pending    Hypotension, improved: Held Antihypertensives. C/w Midodrine 10 mg po TID. Given IV Albumin 12.5 grams x1 yesterday Maintain MAP greater than 65. Closely monitor vital signs while on diuretics; Weaned off Milrinone 0.125 mcg/kg/min today   Persistent A-fib/A-flutter on Eliquis: Resumed home Apixaban 5 mg po BID; Rate was controlled but went into RVR overnight. CTM on Telemetry. Given IV Metoprolol 5 mg q2hprn for HR >115. Holding Antihypertensives and beta-blocking and AV nodal blocking agents given Soft BP. IV Amio is being weaned and she is going to transition to orals tomorrow.  It actually had stopped but she converted back to A-fib and heart rates were in the 120s so she resumed on amiodarone drip at 60 mg/h  Dysphagia: Had trouble swallowing  pills per report. SLP evaluated and now recommending Regular Diet with Thin Liquids but since she is still having issues will have SLP re-evaluate and place back on Soft Diet .   Hypokalemia: K+ is now 4.1. CTM and Replete as Necessary. Repeat CMP in the AM   Hyperlipidemia: Discontinue Atorvastatin 40 mg po Daily   Chronic Anxiety/Depression: C/w Citalopram 20 mg po Dally    Normocytic Anemia: Hgb/Hct relatively stable and is now 9.1/28.5. Checked Anemia Panel showed an iron level 86, UIBC 278, TIBC 364, saturation ratios of 24%, ferritin level greater than 7500, folate level greater than 40, vitamin B12 level 4049 CTM for S/Sx of Bleeding; No overt bleeding noted. Repeat CBC in the AM   Generalized Weakness and Deconditioning: PT/OT recommending SNF once medically stable. Fall precautions  Hypoalbuminemia: Patient's Albumin Level now 3.0. CTM and Trend and repeat CMP in the AM  Severe Protein Calorie Malnutrition/ Underweight and failure to thrive: Nutrition Status- Nutrition Problem: Severe Malnutrition Etiology: chronic illness (COPD, HFpEF) Signs/Symptoms: moderate muscle depletion, severe fat depletion, severe muscle depletion Interventions: Magic cup, Liberalize Diet, Refer to RD note for recommendations Estimated body mass index is 16.71 kg/m as calculated from the following:   Height as of this encounter: 5' (1.524 m).   Weight as of this encounter: 38.8 kg.   Assessment and Plan: No notes have been filed under this hospital service. Service: Hospitalist  DVT prophylaxis: *** Code Status:   Code Status: Limited: Do not attempt resuscitation (DNR) -DNR-LIMITED -Do Not Intubate/DNI  Family Communication: *** Disposition Plan: ***   Consultants:  ***  Procedures:  ***  Antimicrobials: ***    Subjective: No concerns from overnight. No chest pain or dyspnea.  Objective: BP 114/73 (BP Location: Right Arm)   Pulse 65   Temp 98 F (36.7 C) (Oral)    Resp 17   Ht 5' (1.524 m)   Wt 41.3 kg   SpO2 100%   BMI 17.78 kg/m   Examination:  General exam: Appears calm and comfortable Respiratory system: Diminished. Respiratory effort normal. Cardiovascular system: S1 & S2 heard, irregular rhyhtm, normal rate. No murmurs, rubs, gallops or clicks. Gastrointestinal system: Abdomen is distended, soft and generally mildly tender. Normal bowel sounds heard. Central nervous system: Alert and oriented. No focal neurological deficits. Musculoskeletal: No edema. No calf tenderness   Data Reviewed: I have personally reviewed following labs and imaging studies  CBC Lab Results  Component Value Date   WBC 7.9 12/07/2023   RBC 3.21 (L) 12/07/2023   HGB 9.1 (L) 12/07/2023   HCT 28.5 (L) 12/07/2023   MCV 88.8 12/07/2023   MCH 28.3 12/07/2023   PLT 315 12/07/2023   MCHC 31.9 12/07/2023   RDW 15.5 12/07/2023   LYMPHSABS 0.6 (L) 12/07/2023   MONOABS 0.5 12/07/2023   EOSABS 0.1 12/07/2023   BASOSABS 0.0 12/07/2023     Last metabolic panel Lab Results  Component Value Date   NA 142 12/08/2023   K 3.8 12/08/2023   CL 96 (L) 12/08/2023   CO2 36 (H) 12/08/2023   BUN 30 (H) 12/08/2023   CREATININE 0.68 12/08/2023   GLUCOSE 94 12/08/2023   GFRNONAA >60 12/08/2023   GFRAA 80 06/28/2019   CALCIUM 8.5 (L) 12/08/2023   PHOS 2.0 (L) 12/07/2023   PROT 5.9 (L) 12/08/2023   ALBUMIN 2.7 (L) 12/08/2023   LABGLOB 2.4 08/30/2023   AGRATIO 1.5 06/29/2022   BILITOT 0.8 12/08/2023   ALKPHOS 63 12/08/2023   AST 305 (H) 12/08/2023   ALT 895 (H) 12/08/2023   ANIONGAP 10 12/08/2023    GFR: Estimated Creatinine Clearance: 40.8 mL/min (by C-G formula based on SCr of 0.68 mg/dL).  Recent Results (from the past 240 hours)  Resp panel by RT-PCR (RSV, Flu A&B, Covid) Anterior Nasal Swab     Status: None   Collection Time: 12/02/23  2:38 PM   Specimen: Anterior Nasal Swab  Result Value Ref Range Status   SARS Coronavirus 2 by RT PCR NEGATIVE NEGATIVE  Final   Influenza A by PCR NEGATIVE NEGATIVE Final   Influenza B by PCR NEGATIVE NEGATIVE Final    Comment: (NOTE) The Xpert Xpress SARS-CoV-2/FLU/RSV plus assay is intended as an aid in the diagnosis of influenza from Nasopharyngeal swab specimens and should not be used as a sole basis for treatment. Nasal washings and aspirates are unacceptable for Xpert Xpress SARS-CoV-2/FLU/RSV testing.  Fact Sheet for Patients: BloggerCourse.com  Fact Sheet for Healthcare Providers: SeriousBroker.it  This test is not yet approved or cleared by the [place] FDA and has been authorized for detection and/or diagnosis of SARS-CoV-2 by FDA under an Emergency Use Authorization (EUA). This EUA will remain in effect (meaning this test can be used) for the duration of the COVID-19 declaration under Section 564(b)(1) of the Act, 21 U.S.C. section 360bbb-3(b)(1), unless the authorization is terminated or revoked.     Resp Syncytial Virus by  PCR NEGATIVE NEGATIVE Final    Comment: (NOTE) Fact Sheet for Patients: BloggerCourse.com  Fact Sheet for Healthcare Providers: SeriousBroker.it  This test is not yet approved or cleared by the [place] FDA and has been authorized for detection and/or diagnosis of SARS-CoV-2 by FDA under an Emergency Use Authorization (EUA). This EUA will remain in effect (meaning this test can be used) for the duration of the COVID-19 declaration under Section 564(b)(1) of the Act, 21 U.S.C. section 360bbb-3(b)(1), unless the authorization is terminated or revoked.  Performed at [facility], [address], [place], [place] [address]   Culture, blood (Routine X 2) w Reflex to ID Panel     Status: None (Preliminary result)   Collection Time: 12/05/23  4:24 PM   Specimen: BLOOD  Result Value Ref Range Status   Specimen Description BLOOD BLOOD RIGHT ARM  Final    Special Requests   Final    BOTTLES DRAWN AEROBIC ONLY Blood Culture results may not be optimal due to an inadequate volume of blood received in culture bottles   Culture   Final    NO GROWTH 3 DAYS Performed at [facility], [address] [place], [place] [address]    Report Status PENDING  Incomplete  Culture, blood (Routine X 2) w Reflex to ID Panel     Status: None (Preliminary result)   Collection Time: 12/05/23  4:25 PM   Specimen: BLOOD  Result Value Ref Range Status   Specimen Description BLOOD BLOOD RIGHT HAND  Final   Special Requests   Final    BOTTLES DRAWN AEROBIC ONLY Blood Culture results may not be optimal due to an inadequate volume of blood received in culture bottles   Culture   Final    NO GROWTH 3 DAYS Performed at [facility], [address] [place], [place] [address]    Report Status PENDING  Incomplete      Radiology Studies: DG CHEST PORT 1 VIEW Result Date: 12/07/2023 CLINICAL DATA:  Shortness of breath. EXAM: PORTABLE CHEST 1 VIEW COMPARISON:  December 05, 2023. FINDINGS: Stable cardiomediastinal silhouette. Right internal jugular catheter is noted with tip in expected position of SVC. Bibasilar opacities are noted concerning for atelectasis or infiltrates with associated effusions. Bony thorax is unremarkable. IMPRESSION: Stable bibasilar opacities and pleural effusions as noted above. Electronically Signed   By: Lupita Raider M.D.   On: 12/07/2023 09:28   IR Fluoro Guide CV Line Right Result Date: 12/06/2023 CLINICAL DATA:  Heart failure and need for tunneled central venous catheter placement for IV infusion. EXAM: TUNNELED CENTRAL VENOUS CATHETER PLACEMENT WITH ULTRASOUND AND FLUOROSCOPIC GUIDANCE ANESTHESIA/SEDATION: None MEDICATIONS: None FLUOROSCOPY: 18 seconds.  0.3 mGy. PROCEDURE: The procedure, risks, benefits, and alternatives were explained to the patient. Questions regarding the procedure were encouraged and answered. The patient  understands and consents to the procedure. A timeout was performed prior to initiating the procedure. The right neck and chest were prepped with chlorhexidine in a sterile fashion, and a sterile drape was applied covering the operative field. Maximum barrier sterile technique with sterile gowns and gloves were used for the procedure. Local anesthesia was provided with 1% lidocaine. Ultrasound was used to confirm patency of the right internal jugular vein. An ultrasound image was saved and recorded. After creating a small venotomy incision, a 21 gauge needle was advanced into the right internal jugular vein under direct, real-time ultrasound guidance. Ultrasound image documentation was performed. After securing guidewire  access, a 6 French peel-away sheath was placed. A wire was kinked to measure appropriate catheter length. A 6 French, dual-lumen power line tunneled central venous catheter was chosen for placement. This was tunneled in a retrograde fashion from the chest wall to the venotomy incision. The catheter was cut to 21 cm based on guidewire measurement. The catheter was then placed through the sheath and the sheath removed. Final catheter positioning was confirmed and documented with a fluoroscopic spot image. The catheter was aspirated and flushed with saline. The venotomy incision was closed with subcuticular 4-0 Vicryl. Dermabond was applied to the incision. The catheter exit site was secured with prolene retention sutures. COMPLICATIONS: None.  No pneumothorax. FINDINGS: After catheter placement, the tip lies at the SVC/RA junction. The catheter aspirates normally and is ready for immediate use. IMPRESSION: Placement of tunneled, dual-lumen central venous catheter via the right internal jugular vein. The catheter tip lies at the SVC/RA junction. The catheter is ready for immediate use. Electronically Signed   By: Irish Lack M.D.   On: 12/06/2023 15:36   IR US Guide Vasc Access Right Result Date:  12/06/2023 CLINICAL DATA:  Heart failure and need for tunneled central venous catheter placement for IV infusion. EXAM: TUNNELED CENTRAL VENOUS CATHETER PLACEMENT WITH ULTRASOUND AND FLUOROSCOPIC GUIDANCE ANESTHESIA/SEDATION: None MEDICATIONS: None FLUOROSCOPY: 18 seconds.  0.3 mGy. PROCEDURE: The procedure, risks, benefits, and alternatives were explained to the patient. Questions regarding the procedure were encouraged and answered. The patient understands and consents to the procedure. A timeout was performed prior to initiating the procedure. The right neck and chest were prepped with chlorhexidine in a sterile fashion, and a sterile drape was applied covering the operative field. Maximum barrier sterile technique with sterile gowns and gloves were used for the procedure. Local anesthesia was provided with 1% lidocaine. Ultrasound was used to confirm patency of the right internal jugular vein. An ultrasound image was saved and recorded. After creating a small venotomy incision, a 21 gauge needle was advanced into the right internal jugular vein under direct, real-time ultrasound guidance. Ultrasound image documentation was performed. After securing guidewire access, a 6 French peel-away sheath was placed. A wire was kinked to measure appropriate catheter length. A 6 French, dual-lumen power line tunneled central venous catheter was chosen for placement. This was tunneled in a retrograde fashion from the chest wall to the venotomy incision. The catheter was cut to 21 cm based on guidewire measurement. The catheter was then placed through the sheath and the sheath removed. Final catheter positioning was confirmed and documented with a fluoroscopic spot image. The catheter was aspirated and flushed with saline. The venotomy incision was closed with subcuticular 4-0 Vicryl. Dermabond was applied to the incision. The catheter exit site was secured with prolene retention sutures. COMPLICATIONS: None.  No pneumothorax.  FINDINGS: After catheter placement, the tip lies at the SVC/RA junction. The catheter aspirates normally and is ready for immediate use. IMPRESSION: Placement of tunneled, dual-lumen central venous catheter via the right internal jugular vein. The catheter tip lies at the SVC/RA junction. The catheter is ready for immediate use. Electronically Signed   By: Irish Lack M.D.   On: 12/06/2023 15:36   ECHOCARDIOGRAM COMPLETE Result Date: 12/06/2023    ECHOCARDIOGRAM REPORT   Patient Name:   [name] Date of Exam: 12/06/2023 Medical Rec #:  [id number]         Height:       60.0 in Accession #:    [id number]  Weight:       85.8 lb Date of Birth:  May 26, 1951        BSA:          1.302 m Patient Age:    72 years          BP:           100/75 mmHg Patient Gender: F                 HR:           125 bpm. Exam Location:  Inpatient Procedure: 2D Echo, Cardiac Doppler and Color Doppler (Both Spectral and Color            Flow Doppler were utilized during procedure). Indications:    Cardiomyopathy-unspecified I42.9  History:        Patient has prior history of Echocardiogram examinations, most                 recent 11/16/2023. CHF and Cardiomyopathy,                 Signs/Symptoms:Shortness of Breath; Risk Factors:Dyslipidemia.  Sonographer:    Rosaland Lao Sonographer#2:  Darlys Gales Referring Phys: [id number] ADITYA SABHARWAL IMPRESSIONS  1. Left ventricular ejection fraction, by estimation, is 45 to 50% with beat to beat variability. The left ventricle has mildly decreased function. The left ventricle demonstrates global hypokinesis. There is mild left ventricular hypertrophy. Left ventricular diastolic parameters are indeterminate.  2. Right ventricular systolic function is moderately reduced. The right ventricular size is mildly enlarged. There is mildly elevated pulmonary artery systolic pressure. The estimated right ventricular systolic pressure is 38.2 mmHg.  3. Left atrial size was severely dilated.   4. Right atrial size was severely dilated.  5. The mitral valve is abnormal. Severe mitral valve regurgitation. No evidence of mitral stenosis.  6. Tricuspid valve regurgitation is moderate to severe.  7. The aortic valve is tricuspid. Aortic valve regurgitation is not visualized. No aortic stenosis is present.  8. The inferior vena cava is dilated in size with <50% respiratory variability, suggesting right atrial pressure of 15 mmHg. FINDINGS  Left Ventricle: Left ventricular ejection fraction, by estimation, is 45 to 50%. The left ventricle has mildly decreased function. The left ventricle demonstrates global hypokinesis. The left ventricular internal cavity size was normal in size. There is  mild left ventricular hypertrophy. Left ventricular diastolic parameters are indeterminate. Right Ventricle: The right ventricular size is mildly enlarged. No increase in right ventricular wall thickness. Right ventricular systolic function is moderately reduced. There is mildly elevated pulmonary artery systolic pressure. The tricuspid regurgitant velocity is 2.41 m/s, and with an assumed right atrial pressure of 15 mmHg, the estimated right ventricular systolic pressure is 38.2 mmHg. Left Atrium: Left atrial size was severely dilated. Right Atrium: Right atrial size was severely dilated. Pericardium: There is no evidence of pericardial effusion. Mitral Valve: The mitral valve is abnormal. Severe mitral valve regurgitation. No evidence of mitral valve stenosis. Tricuspid Valve: The tricuspid valve is normal in structure. Tricuspid valve regurgitation is moderate to severe. No evidence of tricuspid stenosis. Aortic Valve: The aortic valve is tricuspid. Aortic valve regurgitation is not visualized. No aortic stenosis is present. Pulmonic Valve: The pulmonic valve was normal in structure. Pulmonic valve regurgitation is mild. No evidence of pulmonic stenosis. Aorta: The aortic root is normal in size and structure. Ascending  aorta measurements are within normal limits for age when indexed to body surface area. Venous: The inferior  vena cava is dilated in size with less than 50% respiratory variability, suggesting right atrial pressure of 15 mmHg. IAS/Shunts: No atrial level shunt detected by color flow Doppler.  LEFT VENTRICLE PLAX 2D LVIDd:         4.10 cm LVIDs:         2.70 cm LV PW:         0.90 cm LV IVS:        1.00 cm LVOT diam:     1.70 cm LVOT Area:     2.27 cm  RIGHT VENTRICLE            IVC RV Basal diam:  3.40 cm    IVC diam: 2.10 cm RV S prime:     9.49 cm/s LEFT ATRIUM           Index        RIGHT ATRIUM           Index LA diam:      4.10 cm 3.15 cm/m   RA Area:     20.40 cm LA Vol (A4C): 60.6 ml 46.53 ml/m  RA Volume:   51.90 ml  39.85 ml/m   AORTA Ao Root diam: 3.70 cm Ao Asc diam:  3.60 cm MR Peak grad: 89.2 mmHg   TRICUSPID VALVE MR Vmax:      472.33 cm/s TR Peak grad:   23.2 mmHg                           TR Vmax:        241.00 cm/s                            SHUNTS                           Systemic Diam: 1.70 cm Weston Brass MD Electronically signed by Weston Brass MD Signature Date/Time: 12/06/2023/3:20:13 PM    Final       LOS: 6 days    Jacquelin Hawking, MD [facility] 12/08/2023, 9:23 AM   If 7PM-7AM, please contact night-coverage [email or web address]

## 2023-12-08 NOTE — TOC Transition Note (Addendum)
 Transition of Care (TOC) - Discharge Note Donn Pierini RN, BSN Transitions of Care Unit 4E- RN Case Manager See Treatment Team for direct phone #   Patient Details  Name: [name] MRN: [id number] Date of Birth: [birth year]  Transition of Care [facility]) CM/SW Contact:  Darrold Span, RN Phone Number: 12/08/2023, 4:07 PM   Clinical Narrative:    Referral done to [facility] for [facility] 4/8- TOC has been notified today that pt has been accepted for GIP at [facility] and has bed available for transition to [facility] today.  Family has completed consents per Hospice liaison and pt is ready for transport via EMS.   GOLD DNR to be signed and paperwork placed on chart for [facility] transport.   Hospice liaison to call [facility] dispatch to schedule transport to [facility].  1620- update per Hospice liaison- Per [facility] dispatch there are 2 patients in front of her for transport.   Bedside RN to call report to [facility]- at [phone] Prior to transport   No further TOC interventions needed.    Final next level of care: Hospice Medical Facility Barriers to Discharge: Barriers Resolved   Patient Goals and CMS Choice Patient states their goals for this hospitalization and ongoing recovery are:: Comfort Care- Hospice   Choice offered to / list presented to : Patient, Adult Children      Discharge Placement                 INPT Hospice- [facility]       Discharge Plan and Services Additional resources added to the After Visit Summary for   In-house Referral: Clinical Social Work Discharge Planning Services: CM Consult Post Acute Care Choice: Hospice          DME Arranged: N/A DME Agency: NA       HH Arranged: Disease Management HH Agency[facility] Date [facility] Agency Contacted: 12/07/23   Representative spoke with at [facility] Agency: Shawn  Social Drivers of Health (SDOH) Interventions SDOH Screenings   Food Insecurity:  Food Insecurity Present (12/03/2023)  Housing: Low Risk  (12/03/2023)  Recent Concern: Housing - High Risk (11/14/2023)  Transportation Needs: No Transportation Needs (12/03/2023)  Utilities: Not At Risk (12/03/2023)  Alcohol Screen: Low Risk  (11/17/2023)  Depression (PHQ2-9): Medium Risk (09/16/2023)  Financial Resource Strain: Low Risk  (11/17/2023)  Physical Activity: Inactive (05/11/2023)  Social Connections: Socially Isolated (12/03/2023)  Stress: No Stress Concern Present (05/11/2023)  Tobacco Use: Medium Risk (11/30/2023)  Health Literacy: Adequate Health Literacy (05/11/2023)     Readmission Risk Interventions    12/08/2023    4:07 PM  Readmission Risk Prevention Plan  Transportation Screening Complete  Medication Review (RN Care Manager) Complete  HRI or Home Care Consult Complete  SW Recovery Care/Counseling Consult Complete  Palliative Care Screening Complete  Skilled Nursing Facility Not Applicable

## 2023-12-08 NOTE — Discharge Summary (Signed)
 Physician Discharge Summary   Patient: [name] DOB: 30-Nov-1950  Admit date:     12/02/2023  Discharge date: {dischdate:26783}  Discharge Physician: Jacquelin Hawking   PCP: Melida Quitter, PA   Recommendations at discharge:  {Tip this will not be part of the note when signed- Example include specific recommendations for outpatient follow-up, pending tests to follow-up on. (Optional):26781}  ***  Discharge Diagnoses: Principal Problem:   Acute on chronic diastolic CHF (congestive heart failure) (HCC) Active Problems:   Acute on chronic heart failure with preserved ejection fraction (HFpEF) (HCC)   Paroxysmal atrial fibrillation (HCC)   Severe mitral regurgitation   Pericardial effusion   Palliative care encounter  Resolved Problems:   * No resolved hospital problems. *  Hospital Course: [name] is a 73 y.o. female with medical history significant for COPD on 3 L nasal cannula continuously, nonischemic cardiomyopathy, hyperlipidemia, GERD, recent bimalleolar ankle fracture after fall, paroxysmal A-fib/A-flutter on Eliquis, history of severe MR, moderate pericardial effusion, chronic HFrEF, who presents to the ER via EMS from SNF due to worsening shortness of breath.  Associated with generalized weakness and intermittent palpitations.  No reported chest pain.   In the ER, cardiomegaly with concern for edema on chest x-ray.  BNP elevated greater than 700.  Bilateral lower extremity edema.  The patient received IV Lasix in the ER.  EDP discussed the case with cardiology who recommended medicine admission for further diuresis and management of acute on chronic HFpEF.  Admitted by [facility], hospitalist service.   **Interim History Diuresis was initially held given her low blood pressure midodrine was initiated in order to proceed with diuresis.  PT OT recommended SNF.  General Cardiology consulted and then consulted Structural Cardio team and she is not a  candidate for MitraClip at this time.  Labs worsened and her respiratory status continued worsen.  Renal function also worsened and she was hypoperfusing as her lactic acid level continue to trend up and felt to be in shock. PCCM and advanced heart failure team were consulted and were managing managing shock with Milrinone, Amio, Digoxin.  Subsequently LFTs, Renal Fxn, and LA improving she intermittently goes back and forth between A-fib and RVR.  Further goals of care discussion being held and we are waiting for further outcomes and family would like to speak with hospice and family is likely going to consider residential hospice placement.   Assessment and Plan:  Acute on Chronic Diastolic HFpEF / Sever MR : Presented with BNP of 746.7 and worsened to 2,749.1 -> 2814.3. Has lower extremity edema, progressive dyspnea. IV diuresing initiated in the ER but held now due to BP. Seen by Cardiology and getting IV Albumin and Midodrine. Start strict I's and O's and daily weight. Recent echo done on 11/16/2023 revealed LVEF 60 to 65% and no regional wall motion abnormalities.  Resumed Diuretics with IV Lasix 20 mg BID and give not a candidate for MitraClip per the structural heart team.   -Advanced heart failure team consulted and appreciate their help. They repeated LTD echo yesterday and made changes as below.  -Prognosis is poor and Palliative Consulted and met with Daughter along with the AHF Team and hospice has been discussed and the family would like to speak with hospice heart failure which she has.  Shock with hypoperfusion resulting in AKI, acute liver injury and lactic acidosis - Patient is hypoperfusing LFTs acutely worsened and started renal function - Advanced heart failure team consulted by  cardiology and they are recommending PICC line placement him checking CVP - WBC is worsening so she has been initiated on antibiotics. - Will continue diuresis per cardiology given that she appears volume  overloaded - PCCM also consulted for further evaluation; she has not been initiated on broad-spectrum antibiotics and getting panculture -Lactic Acid trending down.  -GOC Discussions being had and Patient and Family would like to speak with Hospice and Hospice to meet with the Patient and family are leaning toward [facility], [facility] hospice services  Shock liver/acute liver injury: AST and ALT Trend improving: Recent Labs  Lab 11/30/23 1722 12/02/23 1449 12/04/23 0309 12/05/23 0308 12/05/23 1624 12/06/23 0342 12/07/23 0420  AST 23 18 29  3,509* 2,575* 1,706* 729*  ALT 14 12 18  2,153* 2,215* 1,916* 1,358*  BILITOT 0.6 0.3 0.4 0.7 0.6 0.6 1.0  -CTM and repeat CMP in the a.m. and will be obtaining a right upper quadrant ultrasound and acute hepatitis panel which is negative. -Right upper quadrant ultrasound done showed "Portal venous system is patent with normal direction of flow. Concern for gallbladder wall thickening and pericholecystic fluid. In addition, there appears to be trace perihepatic ascites. Study has technical limitations and the spleen is poorly characterized." -CTM and Trend and thankfully improving   AKI: BUN/Cr Trend improved and is now 31/0.82. Avoid Nephrotoxic Medications, Contrast Dyes, Hypotension and Dehydration to Ensure Adequate Renal Perfusion and will need to Renally Adjust Meds -Repeat UA showed cloudy appearance with amber color urine, small hemoglobin, 100 protein, rare bacteria, 11-20 WBCs.  Urine osmolality was 751, urine sodium 17, urine creatinine was 161 -CTM and Trend Renal Function carefully and repeat CMP in the AM   Acute on Chronic Respiratory Failure with Hypoxia / Hx of COPD on 3 L: Worsening change to Xopenex/Atrovent q6h, Brovana and Budesonide. Add Flutter Valve and Incentive Spirometry.  Maintain O2 saturation above 90%. SpO2: 100 % O2 Flow Rate (L/min): 3 L/min. Repeat CXR this AM pending    Hypotension, improved: Held Antihypertensives.  C/w Midodrine 10 mg po TID. Given IV Albumin 12.5 grams x1 yesterday Maintain MAP greater than 65. Closely monitor vital signs while on diuretics; Weaned off Milrinone 0.125 mcg/kg/min today   Persistent A-fib/A-flutter on Eliquis: Resumed home Apixaban 5 mg po BID; Rate was controlled but went into RVR overnight. CTM on Telemetry. Given IV Metoprolol 5 mg q2hprn for HR >115. Holding Antihypertensives and beta-blocking and AV nodal blocking agents given Soft BP. IV Amio is being weaned and she is going to transition to orals tomorrow.  It actually had stopped but she converted back to A-fib and heart rates were in the 120s so she resumed on amiodarone drip at 60 mg/h  Dysphagia: Had trouble swallowing pills per report. SLP evaluated and now recommending Regular Diet with Thin Liquids but since she is still having issues will have SLP re-evaluate and place back on Soft Diet .   Hypokalemia: K+ is now 4.1. CTM and Replete as Necessary. Repeat CMP in the AM   Hyperlipidemia: Discontinue Atorvastatin 40 mg po Daily   Chronic Anxiety/Depression: C/w Citalopram 20 mg po Dally    Normocytic Anemia: Hgb/Hct relatively stable and is now 9.1/28.5. Checked Anemia Panel showed an iron level 86, UIBC 278, TIBC 364, saturation ratios of 24%, ferritin level greater than 7500, folate level greater than 40, vitamin B12 level 4049 CTM for S/Sx of Bleeding; No overt bleeding noted. Repeat CBC in the AM   Generalized Weakness and Deconditioning: PT/OT recommending SNF  once medically stable. Fall precautions  Hypoalbuminemia: Patient's Albumin Level now 3.0. CTM and Trend and repeat CMP in the AM  Severe Protein Calorie Malnutrition/ Underweight and failure to thrive: Nutrition Status- Nutrition Problem: Severe Malnutrition Etiology: chronic illness (COPD, HFpEF) Signs/Symptoms: moderate muscle depletion, severe fat depletion, severe muscle depletion Interventions: Magic cup, Liberalize Diet, Refer to RD note for  recommendations Estimated body mass index is 16.71 kg/m as calculated from the following:   Height as of this encounter: 5' (1.524 m).   Weight as of this encounter: 38.8 kg.  Assessment and Plan: No notes have been filed under this hospital service. Service: Hospitalist     {Tip this will not be part of the note when signed Body mass index is 17.78 kg/m. ,  Nutrition Documentation    Flowsheet Row ED to Hosp-Admission (Current) from 12/02/2023 in [facility] 4E CV SURGICAL PROGRESSIVE CARE  Nutrition Problem Severe Malnutrition  Etiology chronic illness  [COPD, HFpEF]  Nutrition Goal Patient will meet greater than or equal to 90% of their needs  Interventions Magic cup, Liberalize Diet, Refer to RD note for recommendations     ,  (Optional):26781}  {(NOTE) Pain control PDMP Statment (Optional):26782} Consultants: *** Procedures performed: ***  Disposition: {Plan; Disposition:26390} Diet recommendation:  {Diet_Plan:26776} DISCHARGE MEDICATION: Allergies as of 12/08/2023       Reactions   Amoxicillin-pot Clavulanate    diarrhea   Doxycycline    Nausea & vomiting   Venlafaxine    ? Reaction; ? Blurred vision        Medication List     STOP taking these medications    diltiazem 120 MG 24 hr capsule Commonly known as: CARDIZEM CD   furosemide 40 MG tablet Commonly known as: LASIX   magnesium oxide 400 (240 Mg) MG tablet Commonly known as: MAG-OX   potassium chloride SA 20 MEQ tablet Commonly known as: KLOR-CON M       TAKE these medications    acetaminophen 325 MG tablet Commonly known as: TYLENOL Take 2 tablets (650 mg total) by mouth every 6 (six) hours as needed for mild pain (pain score 1-3), fever or headache. What changed:  medication strength how much to take reasons to take this   albuterol 108 (90 Base) MCG/ACT inhaler Commonly known as: VENTOLIN HFA Inhale 2 puffs every 6 hours as needed   amiodarone 200 MG tablet Commonly known as:  PACERONE Take 1 tablet (200 mg total) by mouth daily.   apixaban 5 MG Tabs tablet Commonly known as: ELIQUIS Take 1 tablet (5 mg total) by mouth 2 (two) times daily.   atorvastatin 40 MG tablet Commonly known as: LIPITOR Take 1 tablet (40 mg total) by mouth daily. What changed: when to take this   Calcium Carb-Cholecalciferol 500-10 MG-MCG Tabs Take 1 tablet by mouth daily.   citalopram 20 MG tablet Commonly known as: CELEXA Take 1 tablet (20 mg total) by mouth in the morning.   digoxin 0.125 MG tablet Commonly known as: LANOXIN Take 1 tablet (0.125 mg total) by mouth daily. Start taking on: December 09, 2023   docusate sodium 100 MG capsule Commonly known as: COLACE Take 100 mg by mouth daily.   methocarbamol 750 MG tablet Commonly known as: ROBAXIN TAKE 1 TABLET BY MOUTH 2 TIMES DAILY AS NEEDED FOR MUSCLE SPASMS (NON FORMULARY)   midodrine 5 MG tablet Commonly known as: PROAMATINE Take 1 tablet (5 mg total) by mouth 3 (three) times daily with meals.   multivitamin  with minerals tablet Take 1 tablet by mouth daily.   ondansetron 4 MG tablet Commonly known as: ZOFRAN Take 4 mg by mouth every 8 (eight) hours as needed.   oxyCODONE 5 MG immediate release tablet Commonly known as: Roxicodone Take 1 tablet (5 mg total) by mouth every 4 (four) hours as needed for up to 5 doses for severe pain (pain score 7-10) or breakthrough pain.   OXYGEN Inhale 3 L into the lungs continuous.   pantoprazole 20 MG tablet Commonly known as: PROTONIX Take 20 mg by mouth daily.   umeclidinium bromide 62.5 MCG/ACT Aepb Commonly known as: INCRUSE ELLIPTA Inhale 1 puff into the lungs daily.        Discharge Exam: Filed Weights   12/06/23 0307 12/07/23 0251 12/08/23 0300  Weight: 38.9 kg 38.8 kg 41.3 kg   ***  Condition at discharge: {DC Condition:26389}  The results of significant diagnostics from this hospitalization (including imaging, microbiology, ancillary and  laboratory) are listed below for reference.   Imaging Studies: DG CHEST PORT 1 VIEW Result Date: 12/07/2023 CLINICAL DATA:  Shortness of breath. EXAM: PORTABLE CHEST 1 VIEW COMPARISON:  December 05, 2023. FINDINGS: Stable cardiomediastinal silhouette. Right internal jugular catheter is noted with tip in expected position of SVC. Bibasilar opacities are noted concerning for atelectasis or infiltrates with associated effusions. Bony thorax is unremarkable. IMPRESSION: Stable bibasilar opacities and pleural effusions as noted above. Electronically Signed   By: Lupita Raider M.D.   On: 12/07/2023 09:28   IR Fluoro Guide CV Line Right Result Date: 12/06/2023 CLINICAL DATA:  Heart failure and need for tunneled central venous catheter placement for IV infusion. EXAM: TUNNELED CENTRAL VENOUS CATHETER PLACEMENT WITH ULTRASOUND AND FLUOROSCOPIC GUIDANCE ANESTHESIA/SEDATION: None MEDICATIONS: None FLUOROSCOPY: 18 seconds.  0.3 mGy. PROCEDURE: The procedure, risks, benefits, and alternatives were explained to the patient. Questions regarding the procedure were encouraged and answered. The patient understands and consents to the procedure. A timeout was performed prior to initiating the procedure. The right neck and chest were prepped with chlorhexidine in a sterile fashion, and a sterile drape was applied covering the operative field. Maximum barrier sterile technique with sterile gowns and gloves were used for the procedure. Local anesthesia was provided with 1% lidocaine. Ultrasound was used to confirm patency of the right internal jugular vein. An ultrasound image was saved and recorded. After creating a small venotomy incision, a 21 gauge needle was advanced into the right internal jugular vein under direct, real-time ultrasound guidance. Ultrasound image documentation was performed. After securing guidewire access, a 6 French peel-away sheath was placed. A wire was kinked to measure appropriate catheter length. A 6  French, dual-lumen power line tunneled central venous catheter was chosen for placement. This was tunneled in a retrograde fashion from the chest wall to the venotomy incision. The catheter was cut to 21 cm based on guidewire measurement. The catheter was then placed through the sheath and the sheath removed. Final catheter positioning was confirmed and documented with a fluoroscopic spot image. The catheter was aspirated and flushed with saline. The venotomy incision was closed with subcuticular 4-0 Vicryl. Dermabond was applied to the incision. The catheter exit site was secured with prolene retention sutures. COMPLICATIONS: None.  No pneumothorax. FINDINGS: After catheter placement, the tip lies at the SVC/RA junction. The catheter aspirates normally and is ready for immediate use. IMPRESSION: Placement of tunneled, dual-lumen central venous catheter via the right internal jugular vein. The catheter tip lies at the SVC/RA junction.  The catheter is ready for immediate use. Electronically Signed   By: Irish Lack M.D.   On: 12/06/2023 15:36   IR US Guide Vasc Access Right Result Date: 12/06/2023 CLINICAL DATA:  Heart failure and need for tunneled central venous catheter placement for IV infusion. EXAM: TUNNELED CENTRAL VENOUS CATHETER PLACEMENT WITH ULTRASOUND AND FLUOROSCOPIC GUIDANCE ANESTHESIA/SEDATION: None MEDICATIONS: None FLUOROSCOPY: 18 seconds.  0.3 mGy. PROCEDURE: The procedure, risks, benefits, and alternatives were explained to the patient. Questions regarding the procedure were encouraged and answered. The patient understands and consents to the procedure. A timeout was performed prior to initiating the procedure. The right neck and chest were prepped with chlorhexidine in a sterile fashion, and a sterile drape was applied covering the operative field. Maximum barrier sterile technique with sterile gowns and gloves were used for the procedure. Local anesthesia was provided with 1% lidocaine.  Ultrasound was used to confirm patency of the right internal jugular vein. An ultrasound image was saved and recorded. After creating a small venotomy incision, a 21 gauge needle was advanced into the right internal jugular vein under direct, real-time ultrasound guidance. Ultrasound image documentation was performed. After securing guidewire access, a 6 French peel-away sheath was placed. A wire was kinked to measure appropriate catheter length. A 6 French, dual-lumen power line tunneled central venous catheter was chosen for placement. This was tunneled in a retrograde fashion from the chest wall to the venotomy incision. The catheter was cut to 21 cm based on guidewire measurement. The catheter was then placed through the sheath and the sheath removed. Final catheter positioning was confirmed and documented with a fluoroscopic spot image. The catheter was aspirated and flushed with saline. The venotomy incision was closed with subcuticular 4-0 Vicryl. Dermabond was applied to the incision. The catheter exit site was secured with prolene retention sutures. COMPLICATIONS: None.  No pneumothorax. FINDINGS: After catheter placement, the tip lies at the SVC/RA junction. The catheter aspirates normally and is ready for immediate use. IMPRESSION: Placement of tunneled, dual-lumen central venous catheter via the right internal jugular vein. The catheter tip lies at the SVC/RA junction. The catheter is ready for immediate use. Electronically Signed   By: Irish Lack M.D.   On: 12/06/2023 15:36   ECHOCARDIOGRAM COMPLETE Result Date: 12/06/2023    ECHOCARDIOGRAM REPORT   Patient Name:   ETHELWYN GILBERTSON Date of Exam: 12/06/2023 Medical Rec #:  Peterson         Height:       60.0 in Accession #:    [id number]        Weight:       85.8 lb Date of Birth:  June 07, 1951        BSA:          1.302 m Patient Age:    72 years          BP:           100/75 mmHg Patient Gender: F                 HR:           125 bpm. Exam  Location:  Inpatient Procedure: 2D Echo, Cardiac Doppler and Color Doppler (Both Spectral and Color            Flow Doppler were utilized during procedure). Indications:    Cardiomyopathy-unspecified I42.9  History:        Patient has prior history of Echocardiogram examinations, most  recent 11/16/2023. CHF and Cardiomyopathy,                 Signs/Symptoms:Shortness of Breath; Risk Factors:Dyslipidemia.  Sonographer:    Rosaland Lao Sonographer#2:  Darlys Gales Referring Phys: [id number] ADITYA SABHARWAL IMPRESSIONS  1. Left ventricular ejection fraction, by estimation, is 45 to 50% with beat to beat variability. The left ventricle has mildly decreased function. The left ventricle demonstrates global hypokinesis. There is mild left ventricular hypertrophy. Left ventricular diastolic parameters are indeterminate.  2. Right ventricular systolic function is moderately reduced. The right ventricular size is mildly enlarged. There is mildly elevated pulmonary artery systolic pressure. The estimated right ventricular systolic pressure is 38.2 mmHg.  3. Left atrial size was severely dilated.  4. Right atrial size was severely dilated.  5. The mitral valve is abnormal. Severe mitral valve regurgitation. No evidence of mitral stenosis.  6. Tricuspid valve regurgitation is moderate to severe.  7. The aortic valve is tricuspid. Aortic valve regurgitation is not visualized. No aortic stenosis is present.  8. The inferior vena cava is dilated in size with <50% respiratory variability, suggesting right atrial pressure of 15 mmHg. FINDINGS  Left Ventricle: Left ventricular ejection fraction, by estimation, is 45 to 50%. The left ventricle has mildly decreased function. The left ventricle demonstrates global hypokinesis. The left ventricular internal cavity size was normal in size. There is  mild left ventricular hypertrophy. Left ventricular diastolic parameters are indeterminate. Right Ventricle: The right  ventricular size is mildly enlarged. No increase in right ventricular wall thickness. Right ventricular systolic function is moderately reduced. There is mildly elevated pulmonary artery systolic pressure. The tricuspid regurgitant velocity is 2.41 m/s, and with an assumed right atrial pressure of 15 mmHg, the estimated right ventricular systolic pressure is 38.2 mmHg. Left Atrium: Left atrial size was severely dilated. Right Atrium: Right atrial size was severely dilated. Pericardium: There is no evidence of pericardial effusion. Mitral Valve: The mitral valve is abnormal. Severe mitral valve regurgitation. No evidence of mitral valve stenosis. Tricuspid Valve: The tricuspid valve is normal in structure. Tricuspid valve regurgitation is moderate to severe. No evidence of tricuspid stenosis. Aortic Valve: The aortic valve is tricuspid. Aortic valve regurgitation is not visualized. No aortic stenosis is present. Pulmonic Valve: The pulmonic valve was normal in structure. Pulmonic valve regurgitation is mild. No evidence of pulmonic stenosis. Aorta: The aortic root is normal in size and structure. Ascending aorta measurements are within normal limits for age when indexed to body surface area. Venous: The inferior vena cava is dilated in size with less than 50% respiratory variability, suggesting right atrial pressure of 15 mmHg. IAS/Shunts: No atrial level shunt detected by color flow Doppler.  LEFT VENTRICLE PLAX 2D LVIDd:         4.10 cm LVIDs:         2.70 cm LV PW:         0.90 cm LV IVS:        1.00 cm LVOT diam:     1.70 cm LVOT Area:     2.27 cm  RIGHT VENTRICLE            IVC RV Basal diam:  3.40 cm    IVC diam: 2.10 cm RV S prime:     9.49 cm/s LEFT ATRIUM           Index        RIGHT ATRIUM           Index LA diam:  4.10 cm 3.15 cm/m   RA Area:     20.40 cm LA Vol (A4C): 60.6 ml 46.53 ml/m  RA Volume:   51.90 ml  39.85 ml/m   AORTA Ao Root diam: 3.70 cm Ao Asc diam:  3.60 cm MR Peak grad: 89.2 mmHg    TRICUSPID VALVE MR Vmax:      472.33 cm/s TR Peak grad:   23.2 mmHg                           TR Vmax:        241.00 cm/s                            SHUNTS                           Systemic Diam: 1.70 cm Weston Brass MD Electronically signed by Weston Brass MD Signature Date/Time: 12/06/2023/3:20:13 PM    Final    US ABDOMEN LIMITED WITH LIVER DOPPLER Result Date: 12/05/2023 CLINICAL DATA:  Abnormal LFTs. EXAM: DUPLEX ULTRASOUND OF LIVER TECHNIQUE: Color and duplex Doppler ultrasound was performed to evaluate the hepatic in-flow and out-flow vessels. COMPARISON:  CT abdomen and pelvis 11/30/1998 FINDINGS: Liver: Normal hepatic contour without nodularity. Liver echogenicity is within normal limits. No discrete hepatic lesion. Gallbladder wall thickening measuring 0.3 cm but no significant gallbladder distension. Concern for pericholecystic fluid. Common bile duct measures 0.2 cm. Main Portal Vein size: 1.4 cm Portal Vein Velocities Main Prox:  16 cm/sec Main Mid: 13 cm/sec Main Dist:  21 cm/sec Right: 13 cm/sec Left: 13 cm/sec Hepatic Vein Velocities Right:  54 cm/sec Middle:  52 cm/sec Left:  58 cm/sec IVC: Patent Hepatic Artery Velocity:  94 cm/sec Splenic Vein Velocity: Not visualized Spleen: Not visualized Portal Vein Occlusion/Thrombus: No Splenic Vein Occlusion/Thrombus: No Ascites: Trace perihepatic ascites Varices: None Normal hepatopetal flow in the portal veins. Normal hepatofugal flow in the hepatic veins. Study has technical limitations and the spleen is poorly characterized. IMPRESSION: 1. Portal venous system is patent with normal direction of flow. 2. Concern for gallbladder wall thickening and pericholecystic fluid. In addition, there appears to be trace perihepatic ascites. 3. Study has technical limitations and the spleen is poorly characterized. Electronically Signed   By: Richarda Overlie M.D.   On: 12/05/2023 20:21   [place] EKG SITE RITE Result Date: 12/05/2023 If Site Rite image not attached,  placement could not be confirmed due to current cardiac rhythm.  [place] EKG SITE RITE Result Date: 12/05/2023 If Site Rite image not attached, placement could not be confirmed due to current cardiac rhythm.  DG CHEST PORT 1 VIEW Result Date: 12/05/2023 CLINICAL DATA:  Shortness of breath. EXAM: PORTABLE CHEST 1 VIEW COMPARISON:  12/04/2023. FINDINGS: The heart size and mediastinal contours are unchanged. Aortic atherosclerosis. Persistent bibasilar opacities and small bilateral pleural effusions. Bilateral interstitial prominence, similar to the prior exam concerning for pulmonary edema. No pneumothorax. Dextroscoliotic curvature of the spine. Cervical fixation hardware. No acute osseous abnormality IMPRESSION: 1. Persistent small bilateral pleural effusions and bibasilar opacities. 2. Findings suggestive of pulmonary edema. Electronically Signed   By: Hart Robinsons M.D.   On: 12/05/2023 09:20   DG CHEST PORT 1 VIEW Result Date: 12/04/2023 CLINICAL DATA:  Shortness of breath EXAM: PORTABLE CHEST 1 VIEW COMPARISON:  12/02/2023 FINDINGS: Upper normal heart size. Increasing bibasilar opacities and  pleural effusion. Developing interstitial and reticular opacities in both lungs, suspicious for pulmonary edema. No pneumothorax. Scoliotic curvature of the spine. IMPRESSION: Increasing bibasilar opacities and pleural effusions. Developing interstitial and reticular opacities in both lungs, suspicious for pulmonary edema. Electronically Signed   By: Narda Rutherford M.D.   On: 12/04/2023 14:49   DG Chest Port 1 View Result Date: 12/02/2023 CLINICAL DATA:  Shortness of breath. EXAM: PORTABLE CHEST 1 VIEW COMPARISON:  11/30/2023. FINDINGS: Stable cardiomediastinal silhouette. Emphysematous changes again seen. Similar bibasilar atelectatic changes. No focal consolidation, sizeable effusion, or pneumothorax. Lower cervical fixation hardware. Bilateral breast prosthesis. No acute osseous abnormality. IMPRESSION: Emphysema.   Similar bibasilar atelectatic changes. Electronically Signed   By: Hart Robinsons M.D.   On: 12/02/2023 16:11   CT ABDOMEN PELVIS W CONTRAST Result Date: 11/30/2023 CLINICAL DATA:  Abdominal pain, acute, nonlocalized EXAM: CT ABDOMEN AND PELVIS WITH CONTRAST TECHNIQUE: Multidetector CT imaging of the abdomen and pelvis was performed using the standard protocol following bolus administration of intravenous contrast. RADIATION DOSE REDUCTION: This exam was performed according to the departmental dose-optimization program which includes automated exposure control, adjustment of the mA and/or kV according to patient size and/or use of iterative reconstruction technique. CONTRAST:  75mL OMNIPAQUE IOHEXOL 350 MG/ML SOLN COMPARISON:  11/15/2023 FINDINGS: Lower chest: Cardiomegaly, small pericardial effusion. No pleural effusions. Left base atelectasis. Hepatobiliary: No focal hepatic abnormality. Gallbladder unremarkable. Pancreas: No focal abnormality or ductal dilatation. Spleen: No focal abnormality.  Normal size. Adrenals/Urinary Tract: Area of decreased perfusion in the mid to lower pole of the right kidney is similar to prior study concerning for pyelonephritis. Left kidney enhances normally. No stones or hydronephrosis. Adrenal glands and urinary bladder unremarkable. Stomach/Bowel: Large stool burden throughout the colon. Stomach, large and small bowel grossly unremarkable. Vascular/Lymphatic: Aortic atherosclerosis. No evidence of aneurysm or adenopathy. Reproductive: Prior hysterectomy.  No adnexal masses. Other: No free fluid or free air. Musculoskeletal: No acute bony abnormality. IMPRESSION: Continued area of decreased perfusion in the mid to lower pole of the right kidney compatible with pyelonephritis. This is unchanged since recent study. Coronary artery disease, aortic atherosclerosis. Cardiomegaly. Large stool burden throughout the colon. Electronically Signed   By: Charlett Nose M.D.   On: 11/30/2023  22:20   CT Angio Chest PE W and/or Wo Contrast Result Date: 11/30/2023 CLINICAL DATA:  Pulmonary embolism (PE) suspected, high prob. Shortness of breath EXAM: CT ANGIOGRAPHY CHEST WITH CONTRAST TECHNIQUE: Multidetector CT imaging of the chest was performed using the standard protocol during bolus administration of intravenous contrast. Multiplanar CT image reconstructions and MIPs were obtained to evaluate the vascular anatomy. RADIATION DOSE REDUCTION: This exam was performed according to the departmental dose-optimization program which includes automated exposure control, adjustment of the mA and/or kV according to patient size and/or use of iterative reconstruction technique. CONTRAST:  75mL OMNIPAQUE IOHEXOL 350 MG/ML SOLN COMPARISON:  None Available. FINDINGS: Cardiovascular: Cardiomegaly. Coronary artery and aortic atherosclerosis. No evidence of aortic aneurysm. No filling defects in the pulmonary arteries to suggest pulmonary emboli. Mediastinum/Nodes: Small pericardial effusion. No mediastinal, hilar, or axillary adenopathy. Trachea and esophagus are unremarkable. Thyroid unremarkable. Lungs/Pleura: Moderate emphysema. Compressive atelectasis in the left lower lobe. No confluent opacities or effusions. Upper Abdomen: No acute findings Musculoskeletal: Chest wall soft tissues are unremarkable. Bilateral breast implants grossly unremarkable. No acute bony abnormality. Review of the MIP images confirms the above findings. IMPRESSION: No evidence of pulmonary embolus. Cardiomegaly, coronary artery disease. No acute cardiopulmonary disease. Aortic Atherosclerosis (ICD10-I70.0) and Emphysema (  ICD10-J43.9). Electronically Signed   By: Charlett Nose M.D.   On: 11/30/2023 22:18   DG Chest 2 View Result Date: 11/30/2023 CLINICAL DATA:  Shortness of breath EXAM: CHEST - 2 VIEW COMPARISON:  X-ray 11/17/2023.  CTA 11/14/2023 FINDINGS: Hyperinflation. Stable cardiopericardial silhouette. Improving left retrocardiac  opacity. No pneumothorax or effusion. No edema. Surgical clips seen along the left axillary region and right chest wall. Fixation hardware along the lower cervical spine. Degenerative changes of the spine. Osteopenia. Breast implants identified. IMPRESSION: Hyperinflation with chronic changes. Improving left retrocardiac opacity Electronically Signed   By: Karen Kays M.D.   On: 11/30/2023 19:02   DG Ankle Complete Right Result Date: 11/28/2023 CLINICAL DATA:  Postop. EXAM: RIGHT ANKLE - COMPLETE 3+ VIEW COMPARISON:  10/27/2023 FINDINGS: Lateral plate and multi screw fixation of distal fibular fracture. Decreasing conspicuity of the fracture line consistent with interval healing. Two screws traverse the medial malleolar fracture. Decreasing conspicuity of the fracture line consistent with interval healing. Stable fracture alignment. No periprosthetic lucency. The ankle mortise is preserved. Decreased bone mineralization may be due to disuse. IMPRESSION: ORIF of distal fibular and medial malleolar fractures with evidence of interval healing. No hardware complication. Electronically Signed   By: Narda Rutherford M.D.   On: 11/28/2023 17:51   DG CHEST PORT 1 VIEW Result Date: 11/17/2023 CLINICAL DATA:  Heart failure. EXAM: PORTABLE CHEST 1 VIEW COMPARISON:  Chest radiographs 11/14/2023 and 12/22/2022; CT chest 11/14/2023 FINDINGS: Cardiac silhouette and mediastinal contours are within limits. There is a right basilar heterogeneous airspace opacity similar to prior. Small bilateral pleural effusions obscure the costophrenic angles, similar to prior. No pneumothorax. Mild dextrocurvature of the lower thoracic spine. Surgical clips overlie the lateral aspect of the lateral chest walls and the left axilla, related to bilateral 1 mastectomies and left axillary lymph node the sections seen on prior CT. There lucent cystic emphysematous changes again seen. Mild levocurvature of the upper thoracic spine and mild  dextrocurvature of the thoracolumbar junction. ACDF hardware overlies the lower cervical spine. IMPRESSION: 1. Right basilar heterogeneous airspace opacity, similar to 11/14/2023 but new from 12/22/2022. This may represent atelectasis or pneumonia. 2. Small bilateral pleural effusions, also similar to 11/14/2023 but new from 12/22/2022. 3. Chronic emphysematous changes. Electronically Signed   By: Neita Garnet M.D.   On: 11/17/2023 16:54   ECHO TEE Result Date: 11/16/2023    TRANSESOPHOGEAL ECHO REPORT   Patient Name:   KENNADI ALBANY Date of Exam: 11/16/2023 Medical Rec #:  [id number]         Height:       60.0 in Accession #:    [id number]        Weight:       77.6 lb Date of Birth:  November 26, 1950        BSA:          1.248 m Patient Age:    72 years          BP:           111/66 mmHg Patient Gender: F                 HR:           99 bpm. Exam Location:  Inpatient Procedure: Color Doppler, Cardiac Doppler and Transesophageal Echo (Both            Spectral and Color Flow Doppler were utilized during procedure). Indications:     Cardioversion  History:  Patient has prior history of Echocardiogram examinations, most                  recent 11/15/2023.  Sonographer:     Harriette Bouillon RDCS Referring Phys:  [id number] ZANE ADAMS Diagnosing Phys: Thomasene Ripple DO PROCEDURE: The transesophogeal probe was passed without difficulty through the esophogus of the patient. Sedation performed by different physician. The patient developed no complications during the procedure.  IMPRESSIONS  1. Left ventricular ejection fraction, by estimation, is 55 to 60%. The left ventricle has normal function.  2. Right ventricular systolic function is normal. The right ventricular size is normal.  3. Left atrial size was mildly dilated. No left atrial/left atrial appendage thrombus was detected.  4. The mitral valve is abnormal. Severe mitral valve regurgitation. No evidence of mitral stenosis.  5. The tricuspid valve is abnormal.  Tricuspid valve regurgitation is mild to moderate.  6. The aortic valve is tricuspid. Aortic valve regurgitation is trivial. No aortic stenosis is present.  7. There is borderline dilatation of the ascending aorta, measuring 36 mm. FINDINGS  Left Ventricle: Left ventricular ejection fraction, by estimation, is 55 to 60%. The left ventricle has normal function. The left ventricular internal cavity size was small. Right Ventricle: The right ventricular size is normal. No increase in right ventricular wall thickness. Right ventricular systolic function is normal. Left Atrium: Left atrial size was mildly dilated. No left atrial/left atrial appendage thrombus was detected. Right Atrium: Right atrial size was not well visualized. Pericardium: Trivial pericardial effusion is present. The pericardial effusion is circumferential. Mitral Valve: The mitral valve is abnormal. Severe mitral valve regurgitation. No evidence of mitral valve stenosis. Tricuspid Valve: The tricuspid valve is abnormal. Tricuspid valve regurgitation is mild to moderate. No evidence of tricuspid stenosis. Aortic Valve: The aortic valve is tricuspid. Aortic valve regurgitation is trivial. No aortic stenosis is present. Pulmonic Valve: The pulmonic valve was not well visualized. Pulmonic valve regurgitation is trivial. No evidence of pulmonic stenosis. Aorta: There is borderline dilatation of the ascending aorta, measuring 36 mm. There is minimal (Grade I) layered plaque involving the aortic root. Pulmonary Artery: The pulmonary artery is of normal size. IAS/Shunts: No atrial level shunt detected by color flow Doppler.   AORTA Ao Asc diam: 3.60 cm Lavona Mound Tobb DO Electronically signed by Thomasene Ripple DO Signature Date/Time: 11/16/2023/6:05:04 PM    Final    EP STUDY Result Date: 11/16/2023 See surgical note for result.  VAS [place] LOWER EXTREMITY VENOUS (DVT) (ONLY [facility] & [facility]) Result Date: 11/15/2023  Lower Venous DVT Study Patient Name:  JUVIA AERTS   Date of Exam:   11/14/2023 Medical Rec #: [id number]          Accession #:    [id number] Date of Birth: 1950/12/19         Patient Gender: F Patient Age:   56 years Exam Location:  [facility] Procedure:      VAS [place] LOWER EXTREMITY VENOUS (DVT) Referring Phys: Molly Maduro PATERSON --------------------------------------------------------------------------------  Indications: Edema, and Patient tachycardic on arrival with pulse between 190-220, now post Cardizem rate decreased to 140s-160s..  Risk Factors: Right ORIF of ankle 10/12/23. Limitations: Adult undergarment shadowing in bilateral groins Comparison Study: No prior study on file Performing Technologist: Sherren Kerns RVS  Examination Guidelines: A complete evaluation includes B-mode imaging, spectral Doppler, color Doppler, and power Doppler as needed of all accessible portions of each vessel. Bilateral testing is considered an integral part of a complete examination.  Limited examinations for reoccurring indications may be performed as noted. The reflux portion of the exam is performed with the patient in reverse Trendelenburg.  +---------+---------------+---------+-----------+----------+--------------+ RIGHT    CompressibilityPhasicitySpontaneityPropertiesThrombus Aging +---------+---------------+---------+-----------+----------+--------------+ CFV      Full           Yes      No                                  +---------+---------------+---------+-----------+----------+--------------+ SFJ      Full                                                        +---------+---------------+---------+-----------+----------+--------------+ FV Prox  Full                                                        +---------+---------------+---------+-----------+----------+--------------+ FV Mid   Full                                                        +---------+---------------+---------+-----------+----------+--------------+ FV  DistalFull                                                        +---------+---------------+---------+-----------+----------+--------------+ PFV      Full                                                        +---------+---------------+---------+-----------+----------+--------------+ POP      Full           No       Yes                                 +---------+---------------+---------+-----------+----------+--------------+ PTV      Full                                                        +---------+---------------+---------+-----------+----------+--------------+ PERO     Full                                                        +---------+---------------+---------+-----------+----------+--------------+   +----+---------------+---------+-----------+----------+--------------+ LEFTCompressibilityPhasicitySpontaneityPropertiesThrombus Aging +----+---------------+---------+-----------+----------+--------------+ CFV Full           No  Yes                                 +----+---------------+---------+-----------+----------+--------------+ SFJ Full                                                        +----+---------------+---------+-----------+----------+--------------+     Summary: RIGHT: - There is no evidence of deep vein thrombosis in the lower extremity.  - No cystic structure found in the popliteal fossa. interstitial edema noted throughout  LEFT: - No evidence of common femoral vein obstruction.   *See table(s) above for measurements and observations. Electronically signed by Gerarda Fraction on 11/15/2023 at 5:28:30 PM.    Final    ECHOCARDIOGRAM COMPLETE Result Date: 11/15/2023    ECHOCARDIOGRAM REPORT   Patient Name:   MADESYN AST Date of Exam: 11/15/2023 Medical Rec #:  [place]         Height:       60.0 in Accession #:    [id number]        Weight:       92.0 lb Date of Birth:  07/23/1951        BSA:          1.342 m Patient Age:     72 years          BP:           135/93 mmHg Patient Gender: F                 HR:           133 bpm. Exam Location:  Inpatient Procedure: 2D Echo, Color Doppler and Cardiac Doppler (Both Spectral and Color            Flow Doppler were utilized during procedure). Indications:    Atrial fibrillation  History:        Patient has prior history of Echocardiogram examinations.                 Cardiomyopathy, COPD, Arrythmias:Tachycardia; Risk                 Factors:Former Smoker.  Sonographer:    Lamont Snowball Referring Phys: [id number] SARA-MAIZ A THOMAS IMPRESSIONS  1. Left ventricular ejection fraction, by estimation, is 60 to 65%. The left ventricle has normal function. The left ventricle has no regional wall motion abnormalities. Left ventricular diastolic function could not be evaluated.  2. Right ventricular systolic function is normal. The right ventricular size is normal. There is normal pulmonary artery systolic pressure. The estimated right ventricular systolic pressure is 35.8 mmHg.  3. Left atrial size was severely dilated.  4. Right atrial size was severely dilated.  5. Moderate pericardial effusion. The pericardial effusion is posterior to the left ventricle. There is no evidence of cardiac tamponade.  6. The mitral valve is abnormal. Severe mitral valve regurgitation. No evidence of mitral stenosis.  7. The aortic valve is tricuspid. There is mild calcification of the aortic valve. Aortic valve regurgitation is not visualized. Aortic valve sclerosis/calcification is present, without any evidence of aortic stenosis.  8. Aortic dilatation noted. There is mild dilatation of the ascending aorta, measuring 41 mm.  9. The inferior vena cava is dilated in size with <50% respiratory  variability, suggesting right atrial pressure of 15 mmHg. FINDINGS  Left Ventricle: Left ventricular ejection fraction, by estimation, is 60 to 65%. The left ventricle has normal function. The left ventricle has no regional wall motion  abnormalities. The left ventricular internal cavity size was normal in size. There is  no left ventricular hypertrophy. Left ventricular diastolic function could not be evaluated due to atrial fibrillation. Left ventricular diastolic function could not be evaluated. Right Ventricle: The right ventricular size is normal. No increase in right ventricular wall thickness. Right ventricular systolic function is normal. There is normal pulmonary artery systolic pressure. The tricuspid regurgitant velocity is 2.28 m/s, and  with an assumed right atrial pressure of 15 mmHg, the estimated right ventricular systolic pressure is 35.8 mmHg. Left Atrium: Left atrial size was severely dilated. Right Atrium: Right atrial size was severely dilated. Pericardium: A moderately sized pericardial effusion is present. The pericardial effusion is posterior to the left ventricle. There is no evidence of cardiac tamponade. Mitral Valve: The mitral valve is abnormal. Severe mitral valve regurgitation, with centrally-directed jet. No evidence of mitral valve stenosis. MV peak gradient, 10.2 mmHg. The mean mitral valve gradient is 4.3 mmHg. Tricuspid Valve: The tricuspid valve is normal in structure. Tricuspid valve regurgitation is mild . No evidence of tricuspid stenosis. Aortic Valve: The aortic valve is tricuspid. There is mild calcification of the aortic valve. Aortic valve regurgitation is not visualized. Aortic valve sclerosis/calcification is present, without any evidence of aortic stenosis. Aortic valve peak gradient measures 7.8 mmHg. Pulmonic Valve: The pulmonic valve was normal in structure. Pulmonic valve regurgitation is trivial. No evidence of pulmonic stenosis. Aorta: Aortic dilatation noted. There is mild dilatation of the ascending aorta, measuring 41 mm. Venous: The inferior vena cava is dilated in size with less than 50% respiratory variability, suggesting right atrial pressure of 15 mmHg. IAS/Shunts: No atrial level shunt  detected by color flow Doppler.  LEFT VENTRICLE PLAX 2D LVIDd:         4.60 cm LVIDs:         3.00 cm LV PW:         1.30 cm LV IVS:        0.60 cm LVOT diam:     1.90 cm LV SV:         50 LV SV Index:   37 LVOT Area:     2.84 cm  RIGHT VENTRICLE          IVC RV Basal diam:  3.10 cm  IVC diam: 2.10 cm LEFT ATRIUM             Index        RIGHT ATRIUM           Index LA diam:        4.50 cm 3.35 cm/m   RA Area:     20.90 cm LA Vol (A2C):   63.4 ml 47.25 ml/m  RA Volume:   51.50 ml  38.38 ml/m LA Vol (A4C):   57.9 ml 43.15 ml/m LA Biplane Vol: 64.8 ml 48.29 ml/m  AORTIC VALVE AV Area (Vmax): 2.54 cm AV Vmax:        139.67 cm/s AV Peak Grad:   7.8 mmHg LVOT Vmax:      125.33 cm/s LVOT Vmean:     79.300 cm/s LVOT VTI:       0.175 m  AORTA Ao Root diam: 3.70 cm Ao Asc diam:  4.10 cm MITRAL VALVE  TRICUSPID VALVE MV Area (PHT): 4.79 cm      TR Peak grad:   20.8 mmHg MV Area VTI:   2.55 cm      TR Vmax:        228.00 cm/s MV Peak grad:  10.2 mmHg MV Mean grad:  4.3 mmHg      SHUNTS MV Vmax:       1.59 m/s      Systemic VTI:  0.17 m MV Vmean:      94.9 cm/s     Systemic Diam: 1.90 cm MV Decel Time: 158 msec MR Peak grad:   115.3 mmHg MR Mean grad:   86.0 mmHg MR Vmax:        537.00 cm/s MR Vmean:       445.5 cm/s MR PISA:        4.02 cm MR PISA Radius: 0.80 cm MV E velocity: 149.67 cm/s Arvilla Meres MD Electronically signed by Arvilla Meres MD Signature Date/Time: 11/15/2023/11:11:39 AM    Final    CT ABDOMEN PELVIS W CONTRAST Result Date: 11/15/2023 CLINICAL DATA:  Acute abdominal pain EXAM: CT ABDOMEN AND PELVIS WITH CONTRAST TECHNIQUE: Multidetector CT imaging of the abdomen and pelvis was performed using the standard protocol following bolus administration of intravenous contrast. RADIATION DOSE REDUCTION: This exam was performed according to the departmental dose-optimization program which includes automated exposure control, adjustment of the mA and/or kV according to patient size  and/or use of iterative reconstruction technique. CONTRAST:  50mL OMNIPAQUE IOHEXOL 350 MG/ML SOLN COMPARISON:  None Available. FINDINGS: Lower Chest: Small pleural effusions with bibasilar atelectasis. Hepatobiliary: Normal hepatic contours. No intra- or extrahepatic biliary dilatation. Status post cholecystectomy. Pancreas: Normal pancreas. No ductal dilatation or peripancreatic fluid collection. Spleen: Normal. Adrenals/Urinary Tract: Poor visualization of the adrenal glands. Heterogeneous contrast enhancement of the interpolar right kidney. Normal left kidney. The urinary bladder is normal for degree of distention Stomach/Bowel: There is no hiatal hernia. Normal duodenal course and caliber. No small bowel dilatation or inflammation. No focal colonic abnormality. Normal appendix. Vascular/Lymphatic: There is calcific atherosclerosis of the abdominal aorta. No lymphadenopathy. Reproductive: Normal uterus. No adnexal mass. Other: Small volume left upper quadrant free fluid. Musculoskeletal: Dextroscoliosis with apex at L2. IMPRESSION: 1. Heterogeneous contrast enhancement of the interpolar right kidney, concerning for pyelonephritis. Correlate with urinalysis. 2. Small pleural effusions with bibasilar atelectasis. 3. Small amount of free fluid in the left upper quadrant. Electronically Signed   By: Deatra Robinson M.D.   On: 11/15/2023 02:20   CT Angio Chest PE W and/or Wo Contrast Result Date: 11/14/2023 CLINICAL DATA:  Shortness of breath.  PE suspected EXAM: CT ANGIOGRAPHY CHEST WITH CONTRAST TECHNIQUE: Multidetector CT imaging of the chest was performed using the standard protocol during bolus administration of intravenous contrast. Multiplanar CT image reconstructions and MIPs were obtained to evaluate the vascular anatomy. RADIATION DOSE REDUCTION: This exam was performed according to the departmental dose-optimization program which includes automated exposure control, adjustment of the mA and/or kV  according to patient size and/or use of iterative reconstruction technique. CONTRAST:  50mL OMNIPAQUE IOHEXOL 350 MG/ML SOLN COMPARISON:  Same day chest radiograph and PET/CT 12/27/1998 FINDINGS: Cardiovascular: Cardiomegaly. Small pericardial effusion. Negative for acute pulmonary embolism. Aortic and coronary artery atherosclerotic calcification. Mediastinum/Nodes: Trachea and esophagus are unremarkable. Shotty mediastinal lymph nodes are likely reactive. Lungs/Pleura: Respiratory motion obscures detail. Emphysema. Small right-greater-than-left pleural effusions and compressive atelectasis. Mild diffuse bronchial wall thickening. Question interlobular septal thickening though evaluation is limited by  motion artifact. No pneumothorax. Upper Abdomen: Low-density free fluid in the left upper quadrant. Musculoskeletal: No acute fracture. Postoperative changes both breasts. Review of the MIP images confirms the above findings. IMPRESSION: 1. Negative for acute pulmonary embolism. 2. Question interstitial pulmonary edema. Small right-greater-than-left pleural effusions and compressive atelectasis. 3. Cardiomegaly with small pericardial effusion. 4. Low-density free fluid in the left upper quadrant. CT abdomen pelvis with IV contrast is recommended for further evaluation. Aortic Atherosclerosis (ICD10-I70.0) and Emphysema (ICD10-J43.9). Electronically Signed   By: Minerva Fester M.D.   On: 11/14/2023 19:08   DG Chest Portable 1 View Result Date: 11/14/2023 CLINICAL DATA:  Short of breath. EXAM: PORTABLE CHEST 1 VIEW COMPARISON:  12/22/2022. FINDINGS: Bilateral lung base opacities obscure the hemidiaphragms. Mid and upper lungs are clear. Cardiac silhouette is normal in size. No mediastinal or hilar masses. No pneumothorax.  Suspect small bilateral effusions. Stable changes from prior breast surgery. Skeletal structures are grossly intact. IMPRESSION: 1. Bilateral lung base opacities new since the prior chest  radiographs, suspected to be a combination of atelectasis/pneumonia and small bilateral effusions. No evidence of pulmonary edema. Electronically Signed   By: Amie Portland M.D.   On: 11/14/2023 13:07    Microbiology: Results for orders placed or performed during the hospital encounter of 12/02/23  Resp panel by RT-PCR (RSV, Flu A&B, Covid) Anterior Nasal Swab     Status: None   Collection Time: 12/02/23  2:38 PM   Specimen: Anterior Nasal Swab  Result Value Ref Range Status   SARS Coronavirus 2 by RT PCR NEGATIVE NEGATIVE Final   Influenza A by PCR NEGATIVE NEGATIVE Final   Influenza B by PCR NEGATIVE NEGATIVE Final    Comment: (NOTE) The Xpert Xpress SARS-CoV-2/FLU/RSV plus assay is intended as an aid in the diagnosis of influenza from Nasopharyngeal swab specimens and should not be used as a sole basis for treatment. Nasal washings and aspirates are unacceptable for Xpert Xpress SARS-CoV-2/FLU/RSV testing.  Fact Sheet for Patients: BloggerCourse.com  Fact Sheet for Healthcare Providers: SeriousBroker.it  This test is not yet approved or cleared by the [place] FDA and has been authorized for detection and/or diagnosis of SARS-CoV-2 by FDA under an Emergency Use Authorization (EUA). This EUA will remain in effect (meaning this test can be used) for the duration of the COVID-19 declaration under Section 564(b)(1) of the Act, 21 U.S.C. section 360bbb-3(b)(1), unless the authorization is terminated or revoked.     Resp Syncytial Virus by PCR NEGATIVE NEGATIVE Final    Comment: (NOTE) Fact Sheet for Patients: BloggerCourse.com  Fact Sheet for Healthcare Providers: SeriousBroker.it  This test is not yet approved or cleared by the [place] FDA and has been authorized for detection and/or diagnosis of SARS-CoV-2 by FDA under an Emergency Use Authorization (EUA). This EUA  will remain in effect (meaning this test can be used) for the duration of the COVID-19 declaration under Section 564(b)(1) of the Act, 21 U.S.C. section 360bbb-3(b)(1), unless the authorization is terminated or revoked.  Performed at [facility], [address], [place], [place] [address]   Culture, blood (Routine X 2) w Reflex to ID Panel     Status: None (Preliminary result)   Collection Time: 12/05/23  4:24 PM   Specimen: BLOOD  Result Value Ref Range Status   Specimen Description BLOOD BLOOD RIGHT ARM  Final   Special Requests   Final    BOTTLES DRAWN AEROBIC ONLY Blood Culture results may not be optimal due to  an inadequate volume of blood received in culture bottles   Culture   Final    NO GROWTH 3 DAYS Performed at [facility], [address] [place], [place] [address]    Report Status PENDING  Incomplete  Culture, blood (Routine X 2) w Reflex to ID Panel     Status: None (Preliminary result)   Collection Time: 12/05/23  4:25 PM   Specimen: BLOOD  Result Value Ref Range Status   Specimen Description BLOOD BLOOD RIGHT HAND  Final   Special Requests   Final    BOTTLES DRAWN AEROBIC ONLY Blood Culture results may not be optimal due to an inadequate volume of blood received in culture bottles   Culture   Final    NO GROWTH 3 DAYS Performed at [facility], [address], [place], [place] [address]    Report Status PENDING  Incomplete    Labs: CBC: Recent Labs  Lab 12/03/23 1037 12/04/23 0309 12/05/23 0308 12/06/23 0342 12/07/23 0420  WBC 8.2 12.1* 15.0* 14.3* 7.9  NEUTROABS  --  10.3* 13.4* 12.0* 6.7  HGB 8.2* 8.6* 8.8* 8.7* 9.1*  HCT 26.6* 27.6* 27.5* 27.5* 28.5*  MCV 89.6 90.5 88.7 89.0 88.8  PLT 397 395 344 312 315   Basic Metabolic Panel: Recent Labs  Lab 12/03/23 1037 12/04/23 0309 12/05/23 0308 12/05/23 1624 12/06/23 0342 12/07/23 0420 12/07/23 1305 12/07/23 1449 12/08/23 0608  NA 139 140 141   < > 143 142 138 142 142  K  3.4* 3.5 4.0   < > 3.6 2.5* 7.1* 4.1 3.8  CL 95* 96* 96*   < > 96* 91* 90* 95* 96*  CO2 33* 31 30   < > 33* 38* 36* 37* 36*  GLUCOSE 88 279* 176*   < > 141* 117* 284* 116* 94  BUN 24* 23 39*   < > 39* 33* 30* 31* 30*  CREATININE 0.85 0.90 1.20*   < > 0.85 0.75 0.83 0.82 0.68  CALCIUM 9.1 9.2 9.5   < > 9.2 8.8* 8.6* 8.8* 8.5*  MG 2.0 2.0 2.0  --  1.9 1.8  --   --  2.2  PHOS 2.5 4.6 5.1*  --   --  2.0*  --   --   --    < > = values in this interval not displayed.   Liver Function Tests: Recent Labs  Lab 12/05/23 0308 12/05/23 1624 12/06/23 0342 12/07/23 0420 12/08/23 0608  AST 3,509* 2,575* 1,706* 729* 305*  ALT 2,153* 2,215* 1,916* 1,358* 895*  ALKPHOS 73 72 70 66 63  BILITOT 0.7 0.6 0.6 1.0 0.8  PROT 7.4 7.0 6.8 6.3* 5.9*  ALBUMIN 3.8 3.6 3.4* 3.0* 2.7*   CBG: No results for input(s): "GLUCAP" in the last 168 hours.  Discharge time spent: {LESS THAN/GREATER UUVO:53664} 30 minutes.  Signed: Jacquelin Hawking, MD [facility] 12/08/2023

## 2023-12-08 NOTE — Progress Notes (Signed)
 RT went to pull pt's nebulizer from pyxis for 0800 treatment, it was noted that day shift RN had already removed medication and it is not available for RT to give. RN to give med at this time.

## 2023-12-08 NOTE — Progress Notes (Signed)
 ADVANCED HEART FAILURE PROGRESS NOTE  Referring Physician: No ref. provider found  Primary Care: Melida Quitter, PA  CC: Cardiogenic Shock   Interval hx:  - Milrinone stopped yesterday, co-ox stable 64% today , LFTs continue to trend down. SCr ok, 0.68 - CVP low 1 - Back in NSR, HR 60s   Feels tired and weak but no resting dyspnea. Not eating much. Appetite is poor. Discussed wishes today, she wants to go to [facility].    Current Facility-Administered Medications:    acetaminophen (TYLENOL) tablet 650 mg, 650 mg, Oral, Q6H PRN, Hall, Carole N, DO   albuterol (PROVENTIL) (2.5 MG/3ML) 0.083% nebulizer solution 3 mL, 3 mL, Inhalation, Q6H PRN, Sheikh, Omair Latif, DO, 3 mL at 12/04/23 0217   apixaban (ELIQUIS) tablet 5 mg, 5 mg, Oral, BID, Hall, Carole N, DO, 5 mg at 12/07/23 2117   budesonide (PULMICORT) nebulizer solution 0.25 mg, 0.25 mg, Nebulization, BID, Sheikh, Omair Latif, DO, 0.25 mg at 12/07/23 2015   ceFEPIme (MAXIPIME) 2 g in sodium chloride 0.9 % 100 mL IVPB, 2 g, Intravenous, Q12H, Sheikh, Omair Latif, DO, Stopped at 12/08/23 0257   citalopram (CELEXA) tablet 20 mg, 20 mg, Oral, Daily, Hall, Carole N, DO, 20 mg at 12/07/23 0840   digoxin (LANOXIN) tablet 0.125 mg, 0.125 mg, Oral, Daily, Sabharwal, Aditya, DO, 0.125 mg at 12/07/23 0840   melatonin tablet 5 mg, 5 mg, Oral, QHS PRN, Dow Adolph N, DO, 5 mg at 12/06/23 2119   midodrine (PROAMATINE) tablet 5 mg, 5 mg, Oral, TID WC, Sabharwal, Aditya, DO, 5 mg at 12/08/23 0750   polyethylene glycol (MIRALAX / GLYCOLAX) packet 17 g, 17 g, Oral, Daily PRN, Margo Aye, Carole N, DO   prochlorperazine (COMPAZINE) injection 5 mg, 5 mg, Intravenous, Q6H PRN, Hall, Carole N, DO, 5 mg at 12/04/23 0213   vancomycin (VANCOREADY) IVPB 500 mg/100 mL, 500 mg, Intravenous, Q24H, Sheikh, Kateri Mc [place], DO, Stopped at 12/07/23 2223   PHYSICAL EXAM: Vitals:   12/07/23 2310 12/08/23 0300  BP: 99/63 (!) 101/57  Pulse: (!) 57 (!) 55  Resp: 20  18  Temp: 97.6 F (36.4 C) 98 F (36.7 C)  SpO2: 100% 100%   GENERAL: thin/cachetic F. No respiratory difficulty  Lungs- decreased BS at the bases bilaterally  CARDIAC:  RRR ABDOMEN: Soft, non-tender, non-distended.  EXTREMITIES: warm. No edema  NEUROLOGIC: no obvious focal deficits    DATA REVIEW  TELE: NSR, 60s   ECG: 12/02/23: NSR  As per my personal interpretation  ECHO: 11/15/23: LVEF 65%, normal RV function. Severe MR As per my personal interpretation  Labs:     Latest Ref Rng & Units 12/07/2023    4:20 AM 12/06/2023    3:42 AM 12/05/2023    3:08 AM  CBC  WBC 4.0 - 10.5 K/uL 7.9  14.3  15.0   Hemoglobin 12.0 - 15.0 g/dL 9.1  8.7  8.8   Hematocrit 36.0 - 46.0 % 28.5  27.5  27.5   Platelets 150 - 400 K/uL 315  312  344        Latest Ref Rng & Units 12/08/2023    6:08 AM 12/07/2023    2:49 PM 12/07/2023    1:05 PM  BMP  Glucose 70 - 99 mg/dL 94  045  409   BUN 8 - 23 mg/dL 30  31  30    Creatinine 0.44 - 1.00 mg/dL 8.11  9.14  7.82   Sodium 135 - 145 mmol/L 142  142  138   Potassium 3.5 - 5.1 mmol/L 3.8  4.1  7.1   Chloride 98 - 111 mmol/L 96  95  90   CO2 22 - 32 mmol/L 36  37  36   Calcium 8.9 - 10.3 mg/dL 8.5  8.8  8.6      ASSESSMENT & PLAN:  1. Cardiogenic Shock  - in setting of severe MR, too frail for mTEER and surgical MVR. EF 45-50%, RV mod reduced  - planning transition to palliative care  - now off milrinone, co-ox ok at 64%  - LFTs now improving - Volume status low, CVP 1. No daily diuretics, can use PRN   2. Atrial fibrillation with RVR - now in normal sinus rhythm; continue apixaban 5mg  BID - continue amio gtt until she is ready to go to [facility], then transition to PO   3.  Severe mitral regurgitation - Extremely frail and weak on exam with a BMI of 16. Too frail to be a candidate for any advanced therapies including MitraClip. Baseline function status appears to be very poor. I believe she needs to improve her nutritional status, advance with  physical therapy before pursuing any kind of interventional therapies. She has been seen by the structural heart team and they also agree  - Palliative care team following, considering transition to [facility]   4. Frailty -Body mass index is 17.78 kg/m.  5. COPD - CT chest from admission with emphysema; stable on my exam.    Robbie Lis, PA-C

## 2023-12-08 NOTE — Progress Notes (Signed)
 Daily Progress Note   Patient Name: [name]       Date: 12/08/2023 DOB: 1951-04-05  Age: 73 y.o. MRN#: [id number] Attending Physician: Narda Bonds, MD Primary Care Physician: Melida Quitter, PA Admit Date: 12/02/2023 Length of Stay: 6 days  Reason for Consultation/Follow-up: Establishing goals of care  HPI/Patient Profile:  [name] is a 73 y.o. female with multiple medical problems including COPD on 3 L O2, nonischemic cardiomyopathy after breast cancer treatment, diastolic dysfunction with EF of 55%, paroxysmal A-fib on Eliquis, severe MR being evaluated for MitraClip. Patient recently hospitalized 11/14/2023 to 11/18/2023 with CHF. Patient seen by interventional cardiology with plan for outpatient evaluation for MitraClip. Patient readmitted 12/02/2023 with acute on chronic diastolic CHF. Patient initially hypotensive and unable to tolerate diuretics. She was started on midodrine and albumin. Palliative care consulted to address goals.   Subjective:   Subjective: Chart Reviewed. Updates received. Patient Assessed. Created space and opportunity for patient  and family to explore thoughts and feelings regarding current medical situation.  Today's Discussion: Today before seeing the patient I spoke with the patient's daughter on the phone.  We reviewed the incident from yesterday when the medications were discontinued and the patient went into A-fib and RVR.  I shared that this unfortunately likely means that she would not do well off IV amiodarone and at this point the most appropriate place for her hospice services would be either inpatient as comfort care versus [facility] for inpatient hospice.  In the settings the patient would have ready access to necessary symptom management meds.  Patient's daughter shares that she is agreeable to either location but would defer to her mother's preference if she has 1.  I shared that I would check with the patient and her daughter  shares that there is no need to call back with her answer because she will be at the hospital and about an hour.  I went and saw the patient and we again discussed the clinical situation yesterday.  She did not become overtly symptomatic at that time, but I shared my concern that as rapid heart rate in the setting of heart failure there is a chance she could become symptomatic and I would want to make sure that she we have everything available to keep her comfortable.  She is agreeable to transfer to [facility].  I reached out to the medical team and [facility] team as well as the hospice liaison request evaluation for possible transfer.  Shortly after my visit hospice liaison indicated she has been approved for GIP placement at [facility], a bed is available today, they would work on that transition and update family.  Multiple messages shared back-and-forth regarding the logistics throughout the day.  Anticipate transfer later this afternoon.  I provided emotional and general support through therapeutic listening, empathy, sharing of stories, and other techniques. I answered all questions and addressed all concerns to the best of my ability.  ROS Limited due to frailty  Review of Systems  Constitutional:  Positive for fatigue.  Respiratory:  Positive for shortness of breath (improved).   Cardiovascular:  Negative for chest pain.  Gastrointestinal:  Negative for abdominal pain, nausea and vomiting.  Neurological:  Positive for weakness.    Objective:   Vital Signs:  BP 116/65 (BP Location: Right Arm)   Pulse (!) 101   Temp 98.4 F (36.9 C) (Oral)   Resp (!) 23   Ht 5' (1.524 m)   Wt  41.3 kg   SpO2 100%   BMI 17.78 kg/m   Physical Exam Vitals and nursing note reviewed.  Constitutional:      General: She is not in acute distress.    Appearance: She is ill-appearing.     Comments: Appears very frail and weak  HENT:     Head: Normocephalic and atraumatic.  Cardiovascular:     Rate  and Rhythm: Tachycardia present. Rhythm irregular.  Pulmonary:     Effort: Pulmonary effort is normal. No respiratory distress.  Abdominal:     General: Abdomen is flat.     Palpations: Abdomen is soft.  Skin:    General: Skin is warm and dry.  Neurological:     General: No focal deficit present.     Mental Status: She is alert.  Psychiatric:        Mood and Affect: Mood normal.        Behavior: Behavior normal.     Palliative Assessment/Data: 20-30%    Existing Vynca/ACP Documentation: None  Assessment & Plan:   Impression: Present on Admission:  Acute on chronic diastolic CHF (congestive heart failure) (HCC)  SUMMARY OF RECOMMENDATIONS   DNR-limited Maintain current scope of care until transfer to [facility] Anticipate transition to GIP/inpatient hospice this afternoon Palliative medicine will continue to follow while inpatient  Symptom Management:  Per primary team PMT is available to assist as needed  Code Status: DNR-Limited  Prognosis: < 2 weeks  Discharge Planning: Hospice facility  Discussed with: Patient, family, medical team, nursing team, [facility] team, hospice liaison  Thank you for allowing [place] to participate in the care of KAARIN PARDY PMT will continue to support holistically.  Time Total: 53 min  Detailed review of medical records (labs, imaging, vital signs), medically appropriate exam, discussed with treatment team, counseling and education to patient, family, & staff, documenting clinical information, medication management, coordination of care  Wynne Dust, NP Palliative Medicine Team  Team Phone # [phone] (Nights/Weekends)  04/29/2021, 8:17 AM

## 2023-12-10 LAB — CULTURE, BLOOD (ROUTINE X 2)
Culture: NO GROWTH
Culture: NO GROWTH

## 2023-12-14 ENCOUNTER — Other Ambulatory Visit: Payer: Self-pay | Admitting: *Deleted

## 2023-12-14 NOTE — Patient Outreach (Signed)
 Post -Acute Care Manager follow up. Per [employer] Mrs. Pesch resides in [facility] and [address].   Will follow up with [facility] social worker regarding transition plans.   Will re-refer to [facility] CCM team as benefit of health plan and PCP.   Nolberto Batty, MSN, RN, BSN [place]  [facility] RN Post- Acute Care Manager Direct Dial: [phone]

## 2023-12-19 NOTE — Progress Notes (Deleted)
 HPI- F former smoker (30 pk yrs) followed for COPD, complicated by Dilated Cardiomyopathy, COPD, IBS, GERD, Degenerative Disc Disease, Depression, Hx Breast Cancer/ Bilat mastectomy, no XRT, Chemo stopped due to cardiomyopathy, Fibromyalgia, PFT 03/31/17- Severe obstruction, Response to BD, DLCO mod reduced. FVC 2.20/ 83%, FEV1 1.06/ 52%, R 0.48, TLC 125%, DLCO 49%,   ==============================================================================   02/19/23- 71 yoF former smoker (30 pk yrs) followed for COPD, complicated by Dilated Cardiomyopathy, AFib,  COPD, IBS, GERD, Degenerative Disc Disease, Depression, Hx Breast Cancer/ Bilat mastectomy, no XRT, Chemo stopped due to cardiomyopa --Proair  hfa, Breztri > Incrusethy, Fibromyalgia,  O2 3L  started in hosp for exac COPD in April -----Breathing has been ok SOB  On oxygen  concentrator at night.  Using E tanks for portable oxygen .  We discussed lighter portable systems. She needs refills for Ventolin  and Incruse inhalers which do seem to help. CXR 12/01/22-  IMPRESSION: Hyperinflation.  No acute cardiopulmonary disease  06/21/23- 71 yoF former smoker (30 pk yrs) followed for COPD, complicated by Dilated Cardiomyopathy, AFib,   IBS, GERD, Degenerative Disc Disease, Depression, Hx Breast Cancer/ Bilat mastectomy, no XRT, Chemo stopped due to cardiomyopathy, Fibromyalgia,  --Proair  hfa, Breztri > Incruse,  O2 3L  started in hosp for exac COPD in April, 2024/ [facility] Still easy DOE, but no exacerbation since last hospital stay in April. Mild dry cough. Had flu and RSV vax.  Discussed pneumonia vax. Watching adequacy of current inhalers.  Addendum 10/08/23- Pulmonary Clearance for necessary orthopedic surgery. Pulmonary consultation available at time of surgery if necessary.  12/20/23- 71 yoF former smoker (30 pk yrs) followed for COPD, complicated by Dilated Cardiomyopathy, AFib,   IBS, GERD, Degenerative Disc Disease, Depression, Hx Breast Cancer/ Bilat  mastectomy, no XRT, Chemo stopped due to cardiomyopathy, Fibromyalgia,  --Proair  hfa, Breztri > Incruse,  Hosp- 4/3-410/25GOLD DNR    O2 3L  started in hosp for exac COPD in April, 2024/ [facility]  ROS-see HPI   + = positive Constitutional:    weight loss, night sweats, fevers, chills, fatigue, lassitude. HEENT:    headaches, difficulty swallowing, tooth/dental problems, sore throat,       sneezing, itching, ear ache, nasal congestion, post nasal drip, snoring CV:    chest pain, orthopnea, PND, swelling in lower extremities, anasarca,                                   dizziness, palpitations Resp:  + shortness of breath with exertion or at rest.                productive cough,   non-productive cough, coughing up of blood.              change in color of mucus.  wheezing.   Skin:    rash or lesions. GI:  No-   heartburn, indigestion, abdominal pain, nausea, vomiting, diarrhea,                 change in bowel habits, loss of appetite GU: dysuria, change in color of urine, no urgency or frequency.   flank pain. MS:   joint pain, stiffness, decreased range of motion, back pain. Neuro-     nothing unusual Psych:  change in mood or affect.  depression or anxiety.   memory loss.  OBJ- Physical Exam  O2 2L tank General- Alert, Oriented, Affect-appropriate, Distress- none acute, +petite Skin- rash-none, lesions- none, excoriation- none Lymphadenopathy- none Head- atraumatic  Eyes- Gross vision intact, PERRLA, conjunctivae and secretions clear            Ears- Hearing, canals-normal            Nose- Clear, no-Septal dev, mucus, polyps, erosion, perforation             Throat- Mallampati II-III , mucosa clear , drainage- none, tonsils- atrophic Neck- flexible , trachea midline, no stridor , thyroid  nl, carotid no bruit Chest - symmetrical excursion , unlabored           Heart/CV- RRR , no murmur , no gallop  , no rub, nl s1 s2                           - JVD- none , edema- none,  stasis changes- none, varices- none           Lung- +coarse in bases/ distant, wheeze- none, cough- none , dullness-none, rub- none           Chest wall- +bilateral reconstruction after breast cancer Abd-  Br/ Gen/ Rectal- Not done, not indicated Extrem- cyanosis- none, clubbing, none, atrophy- none, strength- nl Neuro- grossly intact to observation

## 2023-12-20 ENCOUNTER — Ambulatory Visit: Payer: Medicare Other | Admitting: Internal Medicine

## 2023-12-20 ENCOUNTER — Encounter: Payer: Self-pay | Admitting: Internal Medicine

## 2024-01-05 ENCOUNTER — Encounter (HOSPITAL_BASED_OUTPATIENT_CLINIC_OR_DEPARTMENT_OTHER): Admitting: Orthopaedic Surgery

## 2024-01-05 DIAGNOSIS — I509 Heart failure, unspecified: Secondary | ICD-10-CM | POA: Diagnosis not present

## 2024-01-05 DIAGNOSIS — I4891 Unspecified atrial fibrillation: Secondary | ICD-10-CM | POA: Diagnosis not present

## 2024-01-05 DIAGNOSIS — J449 Chronic obstructive pulmonary disease, unspecified: Secondary | ICD-10-CM | POA: Diagnosis not present

## 2024-01-05 DIAGNOSIS — K5900 Constipation, unspecified: Secondary | ICD-10-CM | POA: Diagnosis not present

## 2024-01-11 NOTE — Progress Notes (Deleted)
 Office Visit Note  Patient: [name]             Date of Birth: 28-Jun-1951           MRN: [id number]             PCP: Noreene Bearded, PA Referring: Noreene Bearded, PA Visit Date: 01/25/2024 Occupation: @GUAROCC @  Subjective:    History of Present Illness: [name] is a 73 y.o. female with history of DDD, fibromyalgia, osteoporosis, and osteoarthritis.    DEXA on 01/01/2020: The BMD measured at Femur Neck Right is 0.609 g/cm2 with a T-score of -3.1. -Patient was advised to get repeat DEXA scan with her PCP.  I briefly discussed treatment options for osteoporosis.  Patient states she would like to have dental work completed before initiating treatment.   No updated DEXA in epic to review.   Activities of Daily Living:  Patient reports morning stiffness for *** {minute/hour:19697}.   Patient {ACTIONS;DENIES/REPORTS:21021675::"Denies"} nocturnal pain.  Difficulty dressing/grooming: {ACTIONS;DENIES/REPORTS:21021675::"Denies"} Difficulty climbing stairs: {ACTIONS;DENIES/REPORTS:21021675::"Denies"} Difficulty getting out of chair: {ACTIONS;DENIES/REPORTS:21021675::"Denies"} Difficulty using hands for taps, buttons, cutlery, and/or writing: {ACTIONS;DENIES/REPORTS:21021675::"Denies"}  No Rheumatology ROS completed.   PMFS History:  Patient Active Problem List   Diagnosis Date Noted   Palliative care encounter 12/04/2023   Acute on chronic heart failure with preserved ejection fraction (HFpEF) (HCC) 12/02/2023   Paroxysmal atrial fibrillation (HCC) 12/02/2023   Severe mitral regurgitation 12/02/2023   Pericardial effusion 12/02/2023   Acute on chronic diastolic CHF (congestive heart failure) (HCC) 12/02/2023   Closed bimalleolar fracture of right ankle 10/12/2023   Closed bimalleolar fracture of right ankle, initial encounter 10/12/2023   Atrial tachycardia (HCC) 06/05/2023   Chronic respiratory failure with hypoxia (HCC) 02/19/2023   Protein-calorie  malnutrition, severe (HCC) 12/24/2022   Tachyarrhythmia 12/22/2022   Hypokalemia 12/22/2022   Estrogen deficiency 06/28/2019   Osteopenia after menopause 11/06/2017   MDD (major depressive disorder), recurrent episode, moderate (HCC) 09/22/2017   Suicide attempt (HCC)    B12 deficiency 08/03/2017   Other insomnia 11/23/2016   DDD (degenerative disc disease), cervical 11/23/2016   DDD (degenerative disc disease), lumbar 11/23/2016   Other idiopathic scoliosis, lumbar region 11/23/2016   COPD mixed type (HCC) 11/20/2013   Vitamin D  deficiency 07/06/2011   CARDIOMYOPATHY, PRIMARY, DILATED 01/01/2009   Malignant tumor of breast (HCC) 07/31/2008   Mixed hyperlipidemia 10/18/2007   Esophagitis 10/18/2007   GAD (generalized anxiety disorder) 10/12/2007   IBS 10/12/2007   Primary osteoarthritis of both hands 10/12/2007   Osteoporosis 10/12/2007   History of colonic polyps 10/12/2007   MIGRAINES, HX OF 10/12/2007   Fibromyalgia--Dr. Ilean Mall, on Ultram 07/14/2007    Past Medical History:  Diagnosis Date   Anemia    B12 deficient   Arthritis    B12 deficiency    Breast cancer (HCC)    left breast.   Chronic SI joint pain    Bilateral, Dr. Alvira Josephs   COPD (chronic obstructive pulmonary disease) (HCC)    Fibromyalgia    Dr. Alvira Josephs   GERD (gastroesophageal reflux disease)    Greater trochanteric bursitis of both hips    Dr. Alvira Josephs   Hyperlipidemia    IBS (irritable bowel syndrome)    LV dysfunction    iatrogenic from chemotherapy ; on Carvedilol    Migraines    Osteoporosis    Dr Lomax---> transferring to a new gyn   Oxygen  dependent    3 liters    Family History  Problem  Relation Age of Onset   Diabetes Mother    Breast cancer Mother    Depression Mother        anxiety   Heart disease Mother        in her 68s   Colon cancer Sister    Healthy Daughter    Stroke Neg Hx    Past Surgical History:  Procedure Laterality Date   ABDOMINAL HYSTERECTOMY     BSO  for  Endometriosis   CARDIOVERSION N/A 11/16/2023   Procedure: CARDIOVERSION;  Surgeon: Jerryl Morin, DO;  Location: [facility];  Service: Cardiovascular;  Laterality: N/A;   CARPAL TUNNEL RELEASE     left   CERVICAL DISC SURGERY     COLONOSCOPY W/ POLYPECTOMY  1999   Dr Andriette Keeling   ESOPHAGEAL DILATION  2005   Dr Andriette Keeling   IR FLUORO GUIDE CV LINE RIGHT  12/06/2023   IR US  GUIDE VASC ACCESS RIGHT  12/06/2023   MASTECTOMY  2009   bilateral, Dr Linell Rhymes   ORIF ANKLE FRACTURE Right 10/12/2023   Procedure: RIGHT OPEN REDUCTION INTERNAL FIXATION (ORIF) ANKLE FRACTURE;  Surgeon: Wilhelmenia Harada, MD;  Location: [facility];  Service: Orthopedics;  Laterality: Right;   SEPTOPLASTY     TRANSESOPHAGEAL ECHOCARDIOGRAM (CATH LAB) N/A 11/16/2023   Procedure: TRANSESOPHAGEAL ECHOCARDIOGRAM;  Surgeon: Jerryl Morin, DO;  Location: [facility];  Service: Cardiovascular;  Laterality: N/A;   Social History   Social History Narrative   Lives w/ husband   Immunization History  Administered Date(s) Administered   Fluad Trivalent(High Dose 65+) 05/11/2023   Influenza Whole 07/14/2007, 05/31/2012   Influenza, High Dose Seasonal PF 07/18/2018   PFIZER(Purple Top)SARS-COV-2 Vaccination 10/30/2019, 11/26/2019   PNEUMOCOCCAL CONJUGATE-20 06/21/2023   Td 04/01/1998   Tdap 10/29/2011     Objective: Vital Signs: There were no vitals taken for this visit.   Physical Exam Vitals and nursing note reviewed.  Constitutional:      Appearance: She is well-developed.  HENT:     Head: Normocephalic and atraumatic.  Eyes:     Conjunctiva/sclera: Conjunctivae normal.  Cardiovascular:     Rate and Rhythm: Normal rate and regular rhythm.     Heart sounds: Normal heart sounds.  Pulmonary:     Effort: Pulmonary effort is normal.     Breath sounds: Normal breath sounds.  Abdominal:     General: Bowel sounds are normal.     Palpations: Abdomen is soft.  Musculoskeletal:     Cervical back: Normal range of motion.   Lymphadenopathy:     Cervical: No cervical adenopathy.  Skin:    General: Skin is warm and dry.     Capillary Refill: Capillary refill takes less than 2 seconds.  Neurological:     Mental Status: She is alert and oriented to person, place, and time.  Psychiatric:        Behavior: Behavior normal.      Musculoskeletal Exam: ***  CDAI Exam: CDAI Score: -- Patient Global: --; Provider Global: -- Swollen: --; Tender: -- Joint Exam 01/25/2024   No joint exam has been documented for this visit   There is currently no information documented on the homunculus. Go to the Rheumatology activity and complete the homunculus joint exam.  Investigation: No additional findings.  Imaging: No results found.  Recent Labs: Lab Results  Component Value Date   WBC 7.9 12/07/2023   HGB 9.1 (L) 12/07/2023   PLT 315 12/07/2023   NA 142 12/08/2023  K 3.8 12/08/2023   CL 96 (L) 12/08/2023   CO2 36 (H) 12/08/2023   GLUCOSE 94 12/08/2023   BUN 30 (H) 12/08/2023   CREATININE 0.68 12/08/2023   BILITOT 0.8 12/08/2023   ALKPHOS 63 12/08/2023   AST 305 (H) 12/08/2023   ALT 895 (H) 12/08/2023   PROT 5.9 (L) 12/08/2023   ALBUMIN  2.7 (L) 12/08/2023   CALCIUM  8.5 (L) 12/08/2023   GFRAA 80 06/28/2019    Speciality Comments: Narcotic Agreement Broken- [place] found on last UDS.   Procedures:  No procedures performed Allergies: Amoxicillin-pot clavulanate, Doxycycline, and Venlafaxine   Assessment / Plan:     Visit Diagnoses: Trapezius muscle spasm  DDD (degenerative disc disease), cervical  Spondylosis of lumbar spine  History of scoliosis  Primary osteoarthritis of both hands  Trochanteric bursitis of both hips  Chronic pain of left knee  Other insomnia  Fibromyalgia  Age-related osteoporosis without current pathological fracture  History of vitamin D  deficiency  History of hyperlipidemia  History of gastroesophageal reflux (GERD)  History of IBS  History of  migraine  History of anxiety  History of COPD  History of depression  History of breast cancer  Other fatigue  Orders: No orders of the defined types were placed in this encounter.  No orders of the defined types were placed in this encounter.   Face-to-face time spent with patient was *** minutes. Greater than 50% of time was spent in counseling and coordination of care.  Follow-Up Instructions: No follow-ups on file.   Romayne Clubs, PA-C  Note - This record has been created using Dragon software.  Chart creation errors have been sought, but may not always  have been located. Such creation errors do not reflect on  the standard of medical care.

## 2024-01-25 ENCOUNTER — Ambulatory Visit: Payer: Medicare Other | Admitting: Physician Assistant

## 2024-01-25 DIAGNOSIS — Z853 Personal history of malignant neoplasm of breast: Secondary | ICD-10-CM

## 2024-01-25 DIAGNOSIS — M7061 Trochanteric bursitis, right hip: Secondary | ICD-10-CM

## 2024-01-25 DIAGNOSIS — G8929 Other chronic pain: Secondary | ICD-10-CM

## 2024-01-25 DIAGNOSIS — Z8719 Personal history of other diseases of the digestive system: Secondary | ICD-10-CM

## 2024-01-25 DIAGNOSIS — R5383 Other fatigue: Secondary | ICD-10-CM

## 2024-01-25 DIAGNOSIS — M19041 Primary osteoarthritis, right hand: Secondary | ICD-10-CM

## 2024-01-25 DIAGNOSIS — Z8709 Personal history of other diseases of the respiratory system: Secondary | ICD-10-CM

## 2024-01-25 DIAGNOSIS — G4709 Other insomnia: Secondary | ICD-10-CM

## 2024-01-25 DIAGNOSIS — M797 Fibromyalgia: Secondary | ICD-10-CM

## 2024-01-25 DIAGNOSIS — M5030 Other cervical disc degeneration, unspecified cervical region: Secondary | ICD-10-CM

## 2024-01-25 DIAGNOSIS — M62838 Other muscle spasm: Secondary | ICD-10-CM

## 2024-01-25 DIAGNOSIS — Z8739 Personal history of other diseases of the musculoskeletal system and connective tissue: Secondary | ICD-10-CM

## 2024-01-25 DIAGNOSIS — M810 Age-related osteoporosis without current pathological fracture: Secondary | ICD-10-CM

## 2024-01-25 DIAGNOSIS — Z8659 Personal history of other mental and behavioral disorders: Secondary | ICD-10-CM

## 2024-01-25 DIAGNOSIS — Z8639 Personal history of other endocrine, nutritional and metabolic disease: Secondary | ICD-10-CM

## 2024-01-25 DIAGNOSIS — M47816 Spondylosis without myelopathy or radiculopathy, lumbar region: Secondary | ICD-10-CM

## 2024-01-25 DIAGNOSIS — Z8669 Personal history of other diseases of the nervous system and sense organs: Secondary | ICD-10-CM

## 2024-01-27 ENCOUNTER — Telehealth: Payer: Self-pay | Admitting: Internal Medicine

## 2024-01-27 NOTE — Telephone Encounter (Signed)
 Cmn received from [facility] for O2 concentrator.

## 2024-01-31 NOTE — Telephone Encounter (Signed)
 CMN faxed successfully and signed.

## 2024-02-03 DIAGNOSIS — B351 Tinea unguium: Secondary | ICD-10-CM | POA: Diagnosis not present

## 2024-02-03 DIAGNOSIS — R262 Difficulty in walking, not elsewhere classified: Secondary | ICD-10-CM | POA: Diagnosis not present

## 2024-02-03 DIAGNOSIS — R238 Other skin changes: Secondary | ICD-10-CM | POA: Diagnosis not present

## 2024-02-03 DIAGNOSIS — I872 Venous insufficiency (chronic) (peripheral): Secondary | ICD-10-CM | POA: Diagnosis not present

## 2024-02-03 DIAGNOSIS — M6281 Muscle weakness (generalized): Secondary | ICD-10-CM | POA: Diagnosis not present

## 2024-02-03 DIAGNOSIS — R2689 Other abnormalities of gait and mobility: Secondary | ICD-10-CM | POA: Diagnosis not present

## 2024-05-08 ENCOUNTER — Encounter: Payer: Self-pay | Admitting: Family Medicine

## 2024-05-18 ENCOUNTER — Telehealth: Payer: Self-pay

## 2024-05-18 NOTE — Telephone Encounter (Signed)
 LVM for return call for appointment for Labs and Physical.

## 2024-05-25 ENCOUNTER — Telehealth: Payer: Self-pay | Admitting: *Deleted

## 2024-05-25 NOTE — Telephone Encounter (Signed)
 Copied from CRM [id number]. Topic: General - Other >> May 25, 2024  2:49 PM Rachelle R wrote: Reason for CRM: Patients daughter [name] calling to advised mother is in hospice care and will not be calling or coming in for a visit.   If any questions, can call Heather at [phone]

## 2024-07-03 ENCOUNTER — Encounter: Payer: Self-pay | Admitting: Radiology
